# Patient Record
Sex: Female | Born: 1935 | Race: White | Hispanic: No | Smoking: Never smoker
Health system: Southern US, Community
[De-identification: ages and names within clinical notes are randomized; demographics above are authoritative.]

## PROBLEM LIST (undated history)

## (undated) DIAGNOSIS — IMO0001 Reserved for inherently not codable concepts without codable children: Secondary | ICD-10-CM

## (undated) DIAGNOSIS — E7800 Pure hypercholesterolemia, unspecified: Secondary | ICD-10-CM

## (undated) DIAGNOSIS — I10 Essential (primary) hypertension: Secondary | ICD-10-CM

## (undated) DIAGNOSIS — F419 Anxiety disorder, unspecified: Secondary | ICD-10-CM

## (undated) DIAGNOSIS — E785 Hyperlipidemia, unspecified: Secondary | ICD-10-CM

## (undated) DIAGNOSIS — I519 Heart disease, unspecified: Secondary | ICD-10-CM

## (undated) DIAGNOSIS — I480 Paroxysmal atrial fibrillation: Secondary | ICD-10-CM

## (undated) DIAGNOSIS — R011 Cardiac murmur, unspecified: Secondary | ICD-10-CM

## (undated) DIAGNOSIS — K859 Acute pancreatitis without necrosis or infection, unspecified: Secondary | ICD-10-CM

## (undated) DIAGNOSIS — K219 Gastro-esophageal reflux disease without esophagitis: Secondary | ICD-10-CM

## (undated) DIAGNOSIS — F32A Depression, unspecified: Secondary | ICD-10-CM

## (undated) DIAGNOSIS — I495 Sick sinus syndrome: Secondary | ICD-10-CM

## (undated) DIAGNOSIS — G4730 Sleep apnea, unspecified: Secondary | ICD-10-CM

## (undated) DIAGNOSIS — M1990 Unspecified osteoarthritis, unspecified site: Secondary | ICD-10-CM

## (undated) DIAGNOSIS — T7840XA Allergy, unspecified, initial encounter: Secondary | ICD-10-CM

## (undated) DIAGNOSIS — B029 Zoster without complications: Secondary | ICD-10-CM

## (undated) DIAGNOSIS — J449 Chronic obstructive pulmonary disease, unspecified: Secondary | ICD-10-CM

## (undated) DIAGNOSIS — D699 Hemorrhagic condition, unspecified: Secondary | ICD-10-CM

## (undated) DIAGNOSIS — E039 Hypothyroidism, unspecified: Secondary | ICD-10-CM

## (undated) DIAGNOSIS — J45909 Unspecified asthma, uncomplicated: Secondary | ICD-10-CM

## (undated) DIAGNOSIS — R7302 Impaired glucose tolerance (oral): Secondary | ICD-10-CM

## (undated) DIAGNOSIS — I4891 Unspecified atrial fibrillation: Secondary | ICD-10-CM

## (undated) DIAGNOSIS — H3530 Unspecified macular degeneration: Secondary | ICD-10-CM

## (undated) DIAGNOSIS — I509 Heart failure, unspecified: Secondary | ICD-10-CM

## (undated) DIAGNOSIS — H9190 Unspecified hearing loss, unspecified ear: Secondary | ICD-10-CM

## (undated) DIAGNOSIS — R0902 Hypoxemia: Secondary | ICD-10-CM

## (undated) HISTORY — DX: Acute pancreatitis without necrosis or infection, unspecified: K85.90

## (undated) HISTORY — DX: Unspecified macular degeneration: H35.30

## (undated) HISTORY — DX: Paroxysmal atrial fibrillation: I48.0

## (undated) HISTORY — DX: Heart failure, unspecified: I50.9

## (undated) HISTORY — DX: Essential (primary) hypertension: I10

## (undated) HISTORY — DX: Impaired glucose tolerance (oral): R73.02

## (undated) HISTORY — DX: Sick sinus syndrome: I49.5

## (undated) HISTORY — DX: Allergy, unspecified, initial encounter: T78.40XA

## (undated) HISTORY — DX: Anxiety disorder, unspecified: F41.9

## (undated) HISTORY — DX: Unspecified osteoarthritis, unspecified site: M19.90

## (undated) HISTORY — DX: Hyperlipidemia, unspecified: E78.5

## (undated) HISTORY — DX: Hemorrhagic condition, unspecified: D69.9

## (undated) HISTORY — DX: Unspecified atrial fibrillation: I48.91

## (undated) HISTORY — DX: Pure hypercholesterolemia, unspecified: E78.00

## (undated) HISTORY — DX: Unspecified asthma, uncomplicated: J45.909

## (undated) HISTORY — DX: Chronic obstructive pulmonary disease, unspecified: J44.9

## (undated) HISTORY — DX: Heart disease, unspecified: I51.9

## (undated) HISTORY — DX: Hypoxemia: R09.02

## (undated) HISTORY — DX: Depression, unspecified: F32.A

## (undated) HISTORY — DX: Gastro-esophageal reflux disease without esophagitis: K21.9

## (undated) HISTORY — DX: Zoster without complications: B02.9

## (undated) HISTORY — PX: EYE SURGERY: SHX253

## (undated) HISTORY — DX: Cardiac murmur, unspecified: R01.1

## (undated) HISTORY — DX: Hypothyroidism, unspecified: E03.9

## (undated) HISTORY — PX: CATARACT EXTRACTION: SUR2

## (undated) HISTORY — DX: Unspecified hearing loss, unspecified ear: H91.90

## (undated) HISTORY — DX: Reserved for inherently not codable concepts without codable children: IMO0001

## (undated) HISTORY — DX: Sleep apnea, unspecified: G47.30

## (undated) HISTORY — PX: ABDOMINAL HYSTERECTOMY: SHX81

---

## 1968-08-28 HISTORY — PX: SALPINGOOPHORECTOMY: SHX82

## 1998-01-20 ENCOUNTER — Other Ambulatory Visit: Admission: RE | Admit: 1998-01-20 | Discharge: 1998-01-20 | Payer: Self-pay | Admitting: Family Medicine

## 2003-08-21 ENCOUNTER — Inpatient Hospital Stay (HOSPITAL_COMMUNITY): Admission: EM | Admit: 2003-08-21 | Discharge: 2003-08-24 | Payer: Self-pay | Admitting: Emergency Medicine

## 2003-09-08 ENCOUNTER — Emergency Department (HOSPITAL_COMMUNITY): Admission: EM | Admit: 2003-09-08 | Discharge: 2003-09-09 | Payer: Self-pay | Admitting: *Deleted

## 2003-09-22 ENCOUNTER — Ambulatory Visit (HOSPITAL_COMMUNITY): Admission: RE | Admit: 2003-09-22 | Discharge: 2003-09-22 | Payer: Self-pay | Admitting: Family Medicine

## 2004-05-12 ENCOUNTER — Ambulatory Visit (HOSPITAL_COMMUNITY): Admission: RE | Admit: 2004-05-12 | Discharge: 2004-05-12 | Payer: Self-pay | Admitting: Family Medicine

## 2004-08-04 ENCOUNTER — Ambulatory Visit: Payer: Self-pay | Admitting: Family Medicine

## 2004-09-23 ENCOUNTER — Ambulatory Visit: Payer: Self-pay | Admitting: Family Medicine

## 2004-11-01 ENCOUNTER — Ambulatory Visit: Payer: Self-pay | Admitting: Family Medicine

## 2005-01-05 ENCOUNTER — Ambulatory Visit: Payer: Self-pay | Admitting: Family Medicine

## 2005-03-03 ENCOUNTER — Ambulatory Visit: Payer: Self-pay | Admitting: Family Medicine

## 2005-04-13 ENCOUNTER — Ambulatory Visit: Payer: Self-pay | Admitting: Family Medicine

## 2005-06-13 ENCOUNTER — Ambulatory Visit: Payer: Self-pay | Admitting: Family Medicine

## 2005-07-10 ENCOUNTER — Ambulatory Visit (HOSPITAL_COMMUNITY): Admission: RE | Admit: 2005-07-10 | Discharge: 2005-07-10 | Payer: Self-pay | Admitting: Family Medicine

## 2005-10-05 ENCOUNTER — Ambulatory Visit: Payer: Self-pay | Admitting: Family Medicine

## 2006-02-02 ENCOUNTER — Ambulatory Visit: Payer: Self-pay | Admitting: Family Medicine

## 2006-06-22 ENCOUNTER — Ambulatory Visit: Payer: Self-pay | Admitting: Family Medicine

## 2006-07-13 ENCOUNTER — Ambulatory Visit (HOSPITAL_COMMUNITY): Admission: RE | Admit: 2006-07-13 | Discharge: 2006-07-13 | Payer: Self-pay | Admitting: Family Medicine

## 2006-11-21 ENCOUNTER — Encounter: Payer: Self-pay | Admitting: Family Medicine

## 2006-11-21 LAB — CONVERTED CEMR LAB
Indirect Bilirubin: 0.3 mg/dL (ref 0.0–0.9)
LDL Cholesterol: 63 mg/dL (ref 0–99)
Total Protein: 7.1 g/dL (ref 6.0–8.3)
Triglycerides: 165 mg/dL — ABNORMAL HIGH (ref <150)
VLDL: 33 mg/dL (ref 0–40)

## 2006-11-30 ENCOUNTER — Ambulatory Visit: Payer: Self-pay | Admitting: Family Medicine

## 2006-12-07 ENCOUNTER — Ambulatory Visit (HOSPITAL_COMMUNITY): Admission: RE | Admit: 2006-12-07 | Discharge: 2006-12-07 | Payer: Self-pay | Admitting: Family Medicine

## 2007-02-21 ENCOUNTER — Encounter: Payer: Self-pay | Admitting: Family Medicine

## 2007-02-21 LAB — CONVERTED CEMR LAB
ALT: 10 units/L (ref 0–35)
Albumin: 4.3 g/dL (ref 3.5–5.2)
Cholesterol: 159 mg/dL (ref 0–200)
Glucose, Bld: 98 mg/dL (ref 70–99)
Potassium: 4.1 meq/L (ref 3.5–5.3)
Sodium: 138 meq/L (ref 135–145)
TSH: 0.334 microintl units/mL — ABNORMAL LOW (ref 0.350–5.50)
Total CHOL/HDL Ratio: 3.3
Total Protein: 7.5 g/dL (ref 6.0–8.3)
Triglycerides: 189 mg/dL — ABNORMAL HIGH (ref <150)
VLDL: 38 mg/dL (ref 0–40)

## 2007-04-01 ENCOUNTER — Ambulatory Visit: Payer: Self-pay | Admitting: Family Medicine

## 2007-04-15 ENCOUNTER — Encounter: Payer: Self-pay | Admitting: Family Medicine

## 2007-04-15 LAB — CONVERTED CEMR LAB: TSH: 0.854 microintl units/mL (ref 0.350–5.50)

## 2007-05-07 ENCOUNTER — Ambulatory Visit: Payer: Self-pay | Admitting: Family Medicine

## 2007-05-07 ENCOUNTER — Ambulatory Visit (HOSPITAL_COMMUNITY): Admission: RE | Admit: 2007-05-07 | Discharge: 2007-05-07 | Payer: Self-pay | Admitting: Family Medicine

## 2007-05-07 LAB — CONVERTED CEMR LAB
Amylase: 58 units/L (ref 0–105)
Basophils Relative: 0 % (ref 0–1)
Calcium: 11.4 mg/dL — ABNORMAL HIGH (ref 8.4–10.5)
Eosinophils Absolute: 0 10*3/uL (ref 0.0–0.7)
Glucose, Bld: 121 mg/dL — ABNORMAL HIGH (ref 70–99)
MCHC: 33 g/dL (ref 30.0–36.0)
MCV: 89.4 fL (ref 78.0–100.0)
Neutrophils Relative %: 85 % — ABNORMAL HIGH (ref 43–77)
Platelets: 297 10*3/uL (ref 150–400)
Sodium: 142 meq/L (ref 135–145)

## 2007-05-08 ENCOUNTER — Inpatient Hospital Stay (HOSPITAL_COMMUNITY): Admission: AD | Admit: 2007-05-08 | Discharge: 2007-05-10 | Payer: Self-pay

## 2007-05-08 ENCOUNTER — Ambulatory Visit: Payer: Self-pay | Admitting: Gastroenterology

## 2007-05-08 ENCOUNTER — Encounter: Payer: Self-pay | Admitting: Family Medicine

## 2007-05-08 LAB — CONVERTED CEMR LAB
ALT: 16 units/L (ref 0–35)
Albumin: 4.3 g/dL (ref 3.5–5.2)
Bilirubin, Direct: 0.1 mg/dL (ref 0.0–0.3)
Total Bilirubin: 0.7 mg/dL (ref 0.3–1.2)

## 2007-05-23 ENCOUNTER — Ambulatory Visit: Payer: Self-pay | Admitting: Family Medicine

## 2007-05-23 LAB — CONVERTED CEMR LAB
Amylase: 77 units/L (ref 27–131)
BUN: 14 mg/dL (ref 6–23)
CO2: 32 meq/L (ref 19–32)
Chloride: 99 meq/L (ref 96–112)
Eosinophils Absolute: 0.3 10*3/uL (ref 0.0–0.7)
Glucose, Bld: 98 mg/dL (ref 70–99)
Lymphocytes Relative: 24 % (ref 12–46)
Lymphs Abs: 2.2 10*3/uL (ref 0.7–3.3)
MCV: 86.6 fL (ref 78.0–100.0)
Monocytes Relative: 7 % (ref 3–11)
Neutrophils Relative %: 65 % (ref 43–77)
Potassium: 4.2 meq/L (ref 3.5–5.3)
RBC: 3.96 M/uL (ref 3.87–5.11)
WBC: 9.2 10*3/uL (ref 4.0–10.5)

## 2007-06-24 ENCOUNTER — Encounter: Payer: Self-pay | Admitting: Gastroenterology

## 2007-06-24 ENCOUNTER — Ambulatory Visit (HOSPITAL_COMMUNITY): Admission: RE | Admit: 2007-06-24 | Discharge: 2007-06-24 | Payer: Self-pay | Admitting: Gastroenterology

## 2007-06-24 ENCOUNTER — Ambulatory Visit: Payer: Self-pay | Admitting: Gastroenterology

## 2007-06-24 LAB — HM COLONOSCOPY: HM Colonoscopy: ABNORMAL

## 2007-07-04 ENCOUNTER — Ambulatory Visit: Payer: Self-pay | Admitting: Family Medicine

## 2007-08-16 ENCOUNTER — Ambulatory Visit (HOSPITAL_COMMUNITY): Admission: RE | Admit: 2007-08-16 | Discharge: 2007-08-16 | Payer: Self-pay | Admitting: Family Medicine

## 2007-08-19 ENCOUNTER — Encounter: Payer: Self-pay | Admitting: Family Medicine

## 2007-08-19 LAB — CONVERTED CEMR LAB
Alkaline Phosphatase: 107 units/L (ref 39–117)
BUN: 17 mg/dL (ref 6–23)
CO2: 21 meq/L (ref 19–32)
Chloride: 103 meq/L (ref 96–112)
Creatinine, Ser: 1.07 mg/dL (ref 0.40–1.20)
Glucose, Bld: 101 mg/dL — ABNORMAL HIGH (ref 70–99)
Indirect Bilirubin: 0.3 mg/dL (ref 0.0–0.9)
LDL Cholesterol: 60 mg/dL (ref 0–99)
Potassium: 4.3 meq/L (ref 3.5–5.3)
Total Bilirubin: 0.4 mg/dL (ref 0.3–1.2)
VLDL: 34 mg/dL (ref 0–40)

## 2007-08-29 HISTORY — PX: COLONOSCOPY: SHX174

## 2007-10-23 ENCOUNTER — Ambulatory Visit: Payer: Self-pay | Admitting: Family Medicine

## 2007-10-23 LAB — CONVERTED CEMR LAB: TSH: 2.468 microintl units/mL (ref 0.350–5.50)

## 2008-02-17 ENCOUNTER — Encounter: Payer: Self-pay | Admitting: Family Medicine

## 2008-02-17 LAB — CONVERTED CEMR LAB
ALT: 10 units/L (ref 0–35)
AST: 14 units/L (ref 0–37)
Albumin: 4.4 g/dL (ref 3.5–5.2)
Bilirubin, Direct: 0.1 mg/dL (ref 0.0–0.3)
CO2: 25 meq/L (ref 19–32)
Calcium: 9.1 mg/dL (ref 8.4–10.5)
Cholesterol: 165 mg/dL (ref 0–200)
Glucose, Bld: 113 mg/dL — ABNORMAL HIGH (ref 70–99)
HDL: 49 mg/dL (ref >39)
Potassium: 3.6 meq/L (ref 3.5–5.3)
Sodium: 139 meq/L (ref 135–145)
TSH: 6.231 microintl units/mL — ABNORMAL HIGH (ref 0.350–5.50)
Total Bilirubin: 0.5 mg/dL (ref 0.3–1.2)
Total CHOL/HDL Ratio: 3.4
VLDL: 37 mg/dL (ref 0–40)

## 2008-02-18 ENCOUNTER — Ambulatory Visit: Payer: Self-pay | Admitting: Family Medicine

## 2008-02-20 ENCOUNTER — Encounter: Payer: Self-pay | Admitting: Family Medicine

## 2008-02-20 LAB — CONVERTED CEMR LAB: Glucose, Fasting: 94 mg/dL (ref 70–99)

## 2008-02-25 ENCOUNTER — Encounter: Payer: Self-pay | Admitting: Family Medicine

## 2008-02-25 DIAGNOSIS — E785 Hyperlipidemia, unspecified: Secondary | ICD-10-CM | POA: Insufficient documentation

## 2008-02-25 DIAGNOSIS — E782 Mixed hyperlipidemia: Secondary | ICD-10-CM | POA: Insufficient documentation

## 2008-02-25 DIAGNOSIS — E739 Lactose intolerance, unspecified: Secondary | ICD-10-CM | POA: Insufficient documentation

## 2008-02-25 DIAGNOSIS — Z8719 Personal history of other diseases of the digestive system: Secondary | ICD-10-CM | POA: Insufficient documentation

## 2008-02-25 DIAGNOSIS — E039 Hypothyroidism, unspecified: Secondary | ICD-10-CM | POA: Insufficient documentation

## 2008-02-25 DIAGNOSIS — I10 Essential (primary) hypertension: Secondary | ICD-10-CM | POA: Insufficient documentation

## 2008-02-25 DIAGNOSIS — H9190 Unspecified hearing loss, unspecified ear: Secondary | ICD-10-CM | POA: Insufficient documentation

## 2008-03-05 ENCOUNTER — Ambulatory Visit: Payer: Self-pay | Admitting: Family Medicine

## 2008-03-07 ENCOUNTER — Encounter: Payer: Self-pay | Admitting: Family Medicine

## 2008-04-01 ENCOUNTER — Telehealth: Payer: Self-pay | Admitting: Family Medicine

## 2008-04-02 ENCOUNTER — Telehealth: Payer: Self-pay | Admitting: Family Medicine

## 2008-04-21 ENCOUNTER — Telehealth: Payer: Self-pay | Admitting: Family Medicine

## 2008-05-20 ENCOUNTER — Encounter: Payer: Self-pay | Admitting: Family Medicine

## 2008-05-20 LAB — CONVERTED CEMR LAB
Basophils Absolute: 0.1 10*3/uL (ref 0.0–0.1)
Basophils Relative: 1 % (ref 0–1)
Calcium: 10.3 mg/dL (ref 8.4–10.5)
Eosinophils Absolute: 0.7 10*3/uL (ref 0.0–0.7)
Eosinophils Relative: 10 % — ABNORMAL HIGH (ref 0–5)
HCT: 39.7 % (ref 36.0–46.0)
MCV: 93.4 fL (ref 78.0–100.0)
Neutrophils Relative %: 41 % — ABNORMAL LOW (ref 43–77)
Platelets: 335 10*3/uL (ref 150–400)
Potassium: 4.7 meq/L (ref 3.5–5.3)
RDW: 13.6 % (ref 11.5–15.5)
Sodium: 141 meq/L (ref 135–145)
TSH: 0.366 microintl units/mL (ref 0.350–4.50)
WBC: 7 10*3/uL (ref 4.0–10.5)

## 2008-05-27 ENCOUNTER — Ambulatory Visit: Payer: Self-pay | Admitting: Family Medicine

## 2008-06-09 ENCOUNTER — Ambulatory Visit: Payer: Self-pay | Admitting: Family Medicine

## 2008-07-01 ENCOUNTER — Ambulatory Visit: Payer: Self-pay | Admitting: Family Medicine

## 2008-09-07 ENCOUNTER — Encounter: Payer: Self-pay | Admitting: Family Medicine

## 2008-09-14 ENCOUNTER — Encounter: Payer: Self-pay | Admitting: Family Medicine

## 2008-09-14 LAB — CONVERTED CEMR LAB
ALT: 9 units/L (ref 0–35)
AST: 14 units/L (ref 0–37)
Albumin: 4.4 g/dL (ref 3.5–5.2)
Alkaline Phosphatase: 86 units/L (ref 39–117)
Cholesterol: 158 mg/dL (ref 0–200)
HDL: 46 mg/dL (ref >39)
Total Bilirubin: 0.5 mg/dL (ref 0.3–1.2)
Total CHOL/HDL Ratio: 3.4
Total Protein: 7.3 g/dL (ref 6.0–8.3)
Triglycerides: 230 mg/dL — ABNORMAL HIGH (ref <150)

## 2008-10-07 ENCOUNTER — Ambulatory Visit: Payer: Self-pay | Admitting: Family Medicine

## 2008-10-10 DIAGNOSIS — E663 Overweight: Secondary | ICD-10-CM | POA: Insufficient documentation

## 2008-10-23 IMAGING — CT CT ABDOMEN W/ CM
1 of 3 series · 13 of 32 positions shown, 18 images · IV contrast (Omnipaque 300)
Comparison: Chest film earlier today

ABDOMEN CT WITH CONTRAST

CLINICAL DATA: Right lower quadrant pain. Fever. Prior hysterectomy and left
oophorectomy.
TECHNIQUE: Multidetector CT imaging of the abdomen and pelvis was performed
following the standard protocol during bolus administration of intravenous
contrast.

Contrast:  100 cc Omnipaque 300

[Series 2: abd_pel 5.0 b40f · axial · 0.72mm/px · z∈[-474,-34]mm · 13 of 100 slices shown, 18 images]
[im 6/100  soft-tissue]
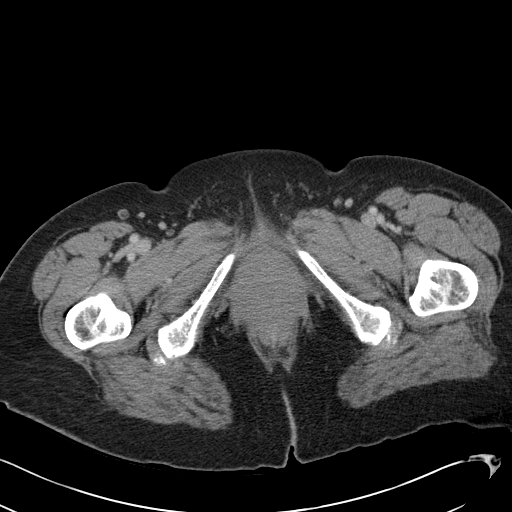
[im 6/100  bone]
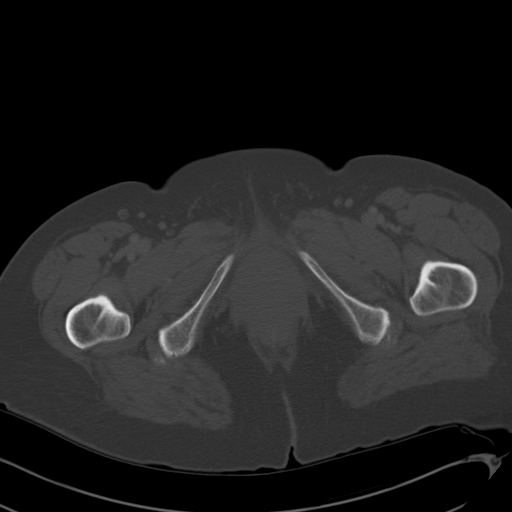
[im 17/100  soft-tissue]
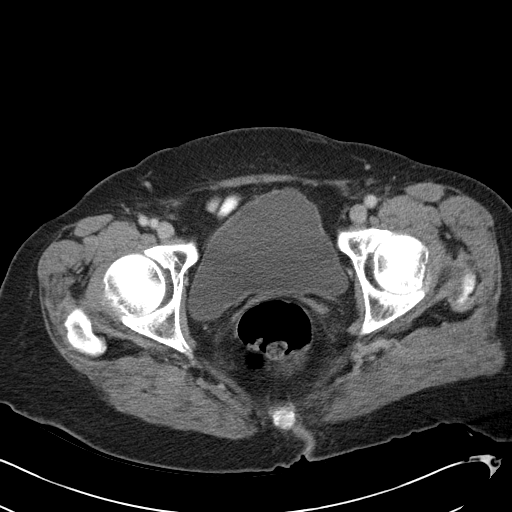
[im 23/100  soft-tissue]
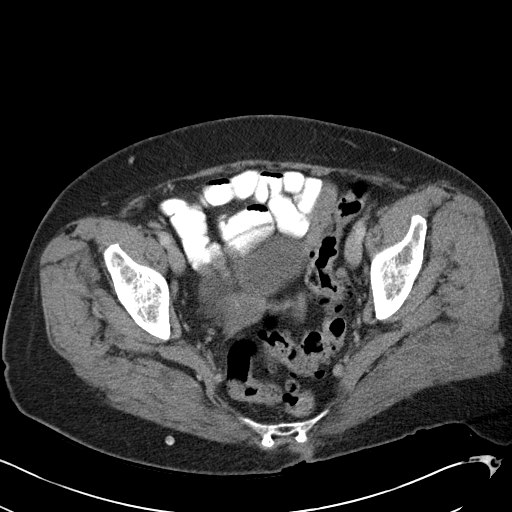
[im 28/100  soft-tissue]
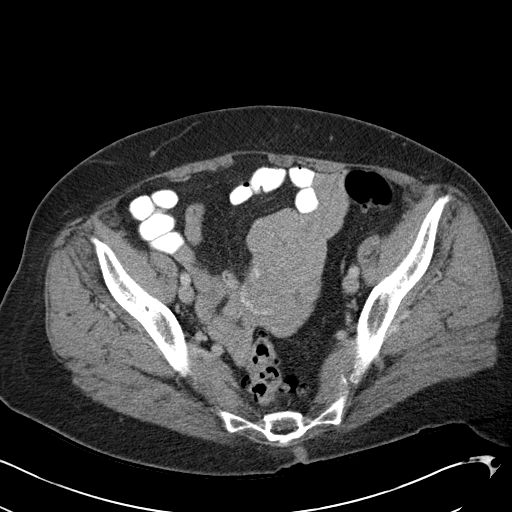
[im 39/100  soft-tissue]
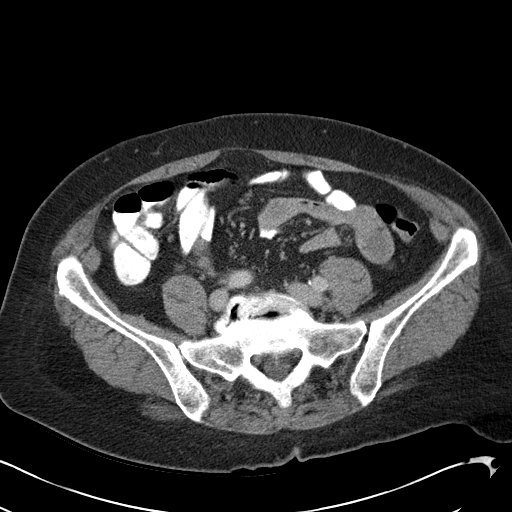
[im 45/100  soft-tissue]
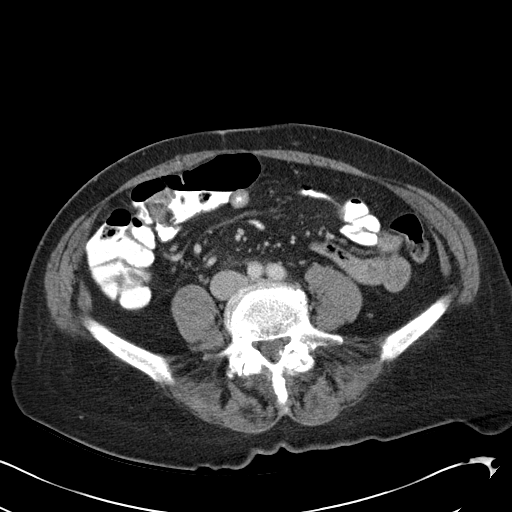
[im 56/100  soft-tissue]
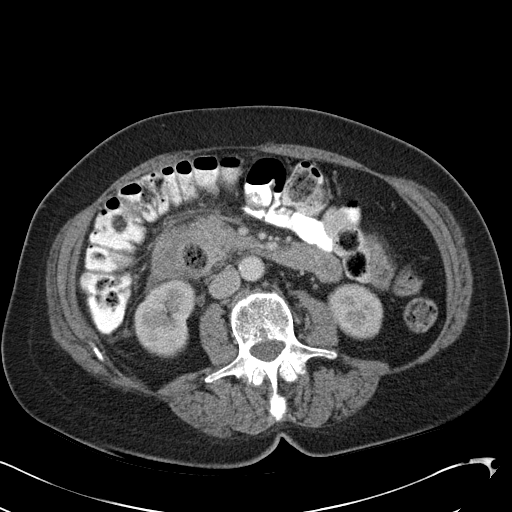
[im 61/100  soft-tissue]
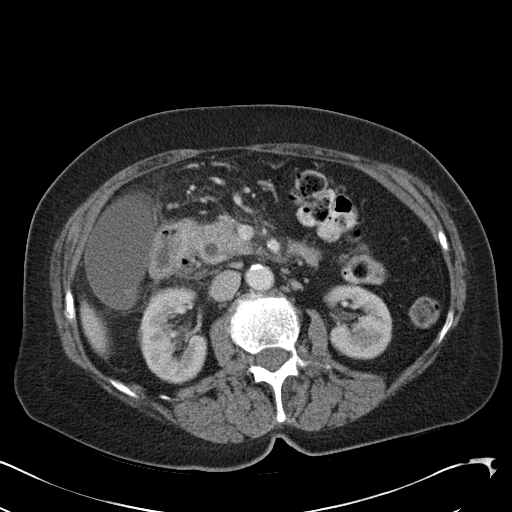
[im 72/100  soft-tissue]
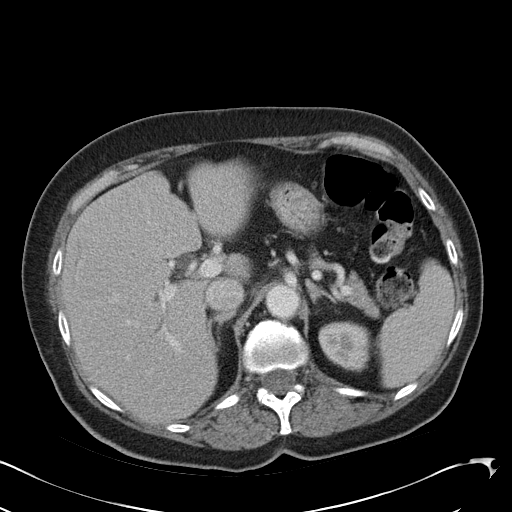
[im 72/100  bone]
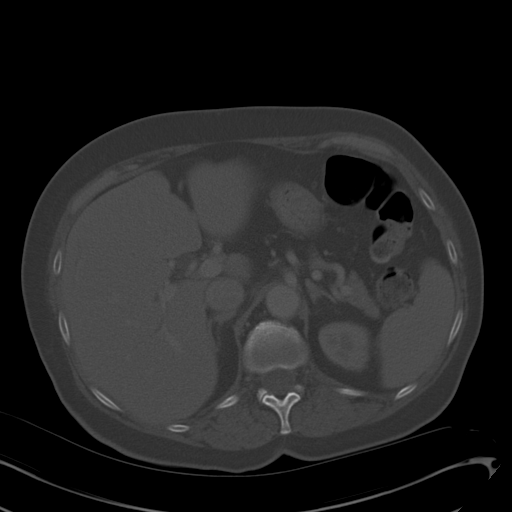
[im 78/100  soft-tissue]
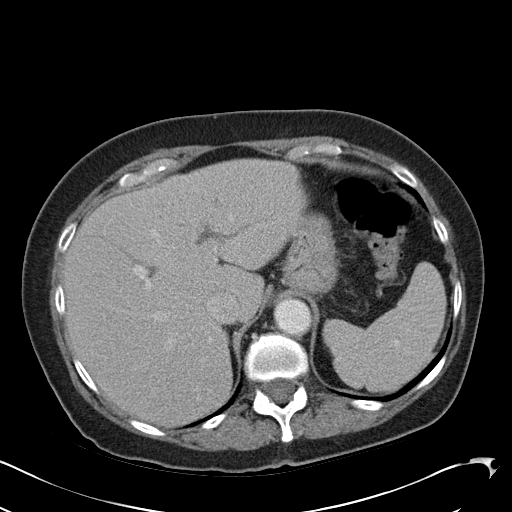
[im 78/100  lung]
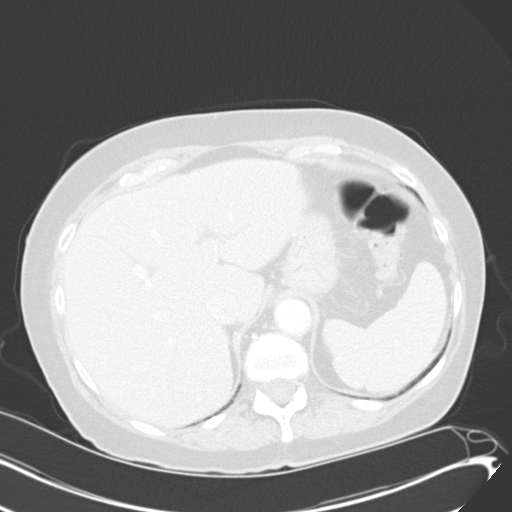
[im 83/100  soft-tissue]
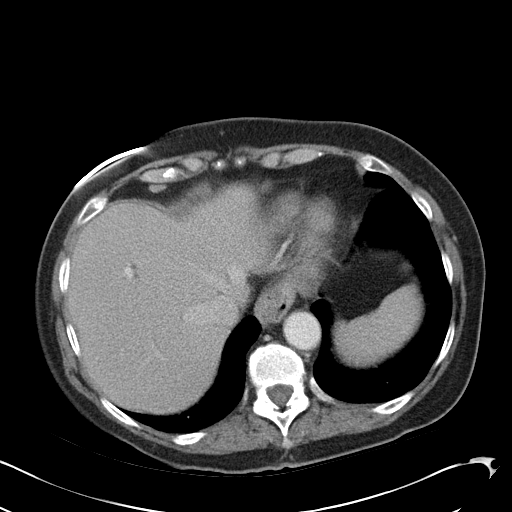
[im 83/100  lung]
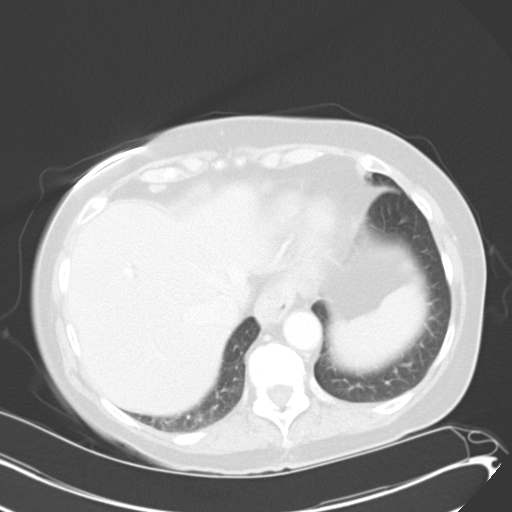
[im 89/100  lung]
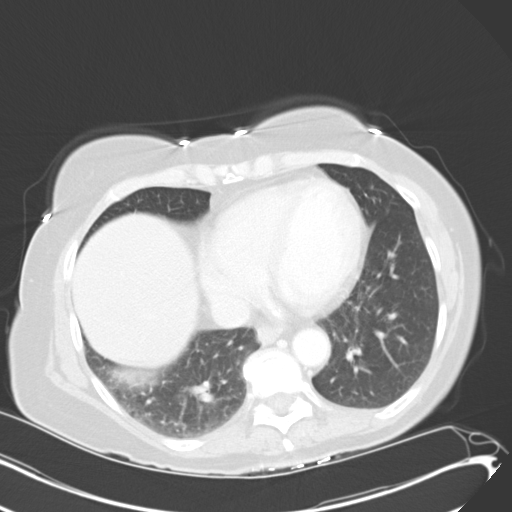
[im 94/100  soft-tissue]
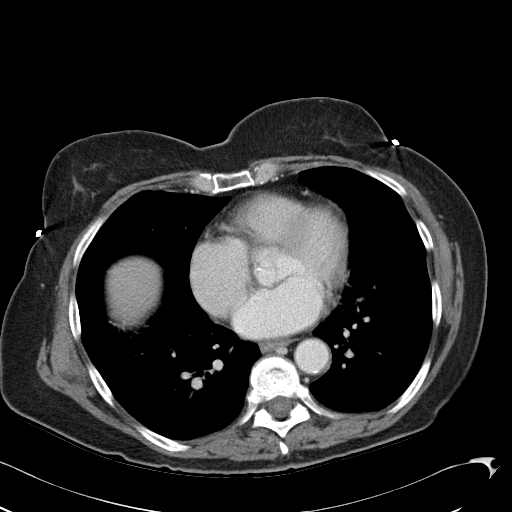
[im 94/100  lung]
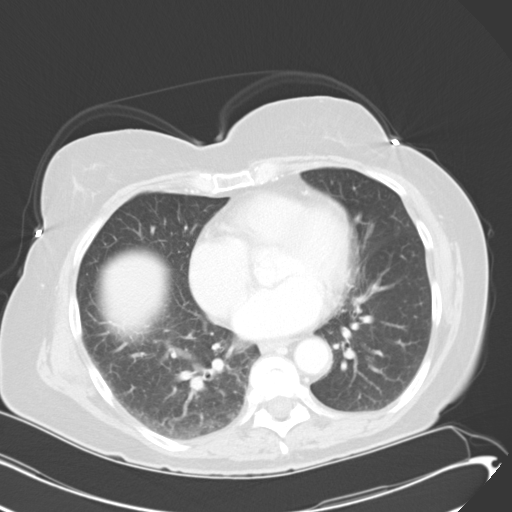

[13 of 32 positions shown; findings below may reference images not displayed]

FINDINGS: Clear lung bases. Normal heart size without pericardial or pleural
effusion. Small hiatal hernia.

No focal liver lesions.

Normal spleen and stomach. Pancreatic tail and body within normal limits. No
pancreatic ductal dilatation.

Gallbladder distention. No pericholecystic edema. Mild intrahepatic biliary
ductal dilatation common duct measuring approximately 11 mm. The common duct
extends anterior to a gas and soft tissue filled structure felt to represent a
duodenal diverticulum. There is moderate edema center at the duodenal
diverticulum as well as within the pancreatic head and duodenum. Fluid in the
anterior pararenal space. No free intraperitoneal air.

Normal adrenal glands. Too small characterize bilateral renal lesions likely
cysts. Small retroperitoneal lymph nodes.

Normal colon. Appendix may be seen on image 63. No right lower quadrant
inflammation. Portions of small bowel unopacified. No ascites.

IMPRESSION

1. Inflammatory process centered at the descending duodenal diverticulum.
Favored etiology diverticulitis. Duodenitis or pancreatitis could look similar.
Consider correlation with pancreatic enzyme levels.
2. Mild biliary ductal dilatation and gallbladder distention. Findings likely
relate to a component of biliary obstruction at the ampulla secondary to
edematous process. Consider correlation with bilirubin levels and or right upper
quadrant ultrasound.
3. No evidence of appendicitis.
4. Small hiatal hernia.

PELVIS CT WITH CONTRAST
FINDINGS: colon diverticulosis. Normal pelvic small bowel. No pelvic
adenopathy. Normal urinary bladder. Hysterectomy. Small calcific foci in right
pelvis on images 76-77 could be related to the right ovary.

Normal bones.

IMPRESSION

1. No acute pelvic process.
2. Hysterectomy.
3. Calcific foci may be associated with the right ovary. These could represent
an underlying tiny teratoma or be dystrophic in nature. Pelvic ultrasound may
help delineate.

## 2008-11-05 ENCOUNTER — Ambulatory Visit: Payer: Self-pay | Admitting: Family Medicine

## 2008-11-05 LAB — CONVERTED CEMR LAB
Basophils Absolute: 0.1 10*3/uL (ref 0.0–0.1)
Basophils Relative: 1 % (ref 0–1)
Eosinophils Absolute: 0.7 10*3/uL (ref 0.0–0.7)
Hemoglobin: 12.7 g/dL (ref 12.0–15.0)
MCHC: 32.3 g/dL (ref 30.0–36.0)
Monocytes Absolute: 0.9 10*3/uL (ref 0.1–1.0)
Monocytes Relative: 10 % (ref 3–12)
Neutrophils Relative %: 39 % — ABNORMAL LOW (ref 43–77)
Platelets: 314 10*3/uL (ref 150–400)
RDW: 13 % (ref 11.5–15.5)

## 2008-11-17 ENCOUNTER — Ambulatory Visit (HOSPITAL_COMMUNITY): Admission: RE | Admit: 2008-11-17 | Discharge: 2008-11-17 | Payer: Self-pay | Admitting: Family Medicine

## 2008-12-24 ENCOUNTER — Ambulatory Visit: Payer: Self-pay | Admitting: Family Medicine

## 2009-01-20 ENCOUNTER — Encounter: Payer: Self-pay | Admitting: Family Medicine

## 2009-02-26 ENCOUNTER — Encounter: Payer: Self-pay | Admitting: Family Medicine

## 2009-02-26 LAB — CONVERTED CEMR LAB
AST: 17 units/L (ref 0–37)
Alkaline Phosphatase: 75 units/L (ref 39–117)
Bilirubin, Direct: 0.1 mg/dL (ref 0.0–0.3)
CO2: 25 meq/L (ref 19–32)
Calcium: 9.5 mg/dL (ref 8.4–10.5)
Cholesterol: 178 mg/dL (ref 0–200)
Creatinine, Ser: 0.97 mg/dL (ref 0.40–1.20)
Glucose, Bld: 109 mg/dL — ABNORMAL HIGH (ref 70–99)
Total Bilirubin: 0.4 mg/dL (ref 0.3–1.2)

## 2009-03-03 ENCOUNTER — Ambulatory Visit: Payer: Self-pay | Admitting: Family Medicine

## 2009-03-03 DIAGNOSIS — R413 Other amnesia: Secondary | ICD-10-CM | POA: Insufficient documentation

## 2009-03-05 ENCOUNTER — Telehealth: Payer: Self-pay | Admitting: Family Medicine

## 2009-03-10 ENCOUNTER — Ambulatory Visit (HOSPITAL_COMMUNITY): Admission: RE | Admit: 2009-03-10 | Discharge: 2009-03-10 | Payer: Self-pay | Admitting: Family Medicine

## 2009-03-11 ENCOUNTER — Telehealth: Payer: Self-pay | Admitting: Family Medicine

## 2009-03-19 ENCOUNTER — Encounter: Payer: Self-pay | Admitting: Family Medicine

## 2009-04-13 ENCOUNTER — Telehealth: Payer: Self-pay | Admitting: Family Medicine

## 2009-04-19 ENCOUNTER — Ambulatory Visit (HOSPITAL_COMMUNITY): Payer: Self-pay | Admitting: Psychology

## 2009-04-22 ENCOUNTER — Encounter: Payer: Self-pay | Admitting: Family Medicine

## 2009-05-05 ENCOUNTER — Ambulatory Visit: Payer: Self-pay | Admitting: Family Medicine

## 2009-06-03 LAB — CONVERTED CEMR LAB
Albumin: 4.4 g/dL (ref 3.5–5.2)
Alkaline Phosphatase: 93 units/L (ref 39–117)
BUN: 25 mg/dL — ABNORMAL HIGH (ref 6–23)
Bilirubin, Direct: 0.1 mg/dL (ref 0.0–0.3)
CO2: 24 meq/L (ref 19–32)
Chloride: 102 meq/L (ref 96–112)
Creatinine, Ser: 1.12 mg/dL (ref 0.40–1.20)
Glucose, Bld: 109 mg/dL — ABNORMAL HIGH (ref 70–99)
Indirect Bilirubin: 0.5 mg/dL (ref 0.0–0.9)
LDL Cholesterol: 80 mg/dL (ref 0–99)
Total Bilirubin: 0.6 mg/dL (ref 0.3–1.2)

## 2009-06-10 ENCOUNTER — Ambulatory Visit: Payer: Self-pay | Admitting: Family Medicine

## 2009-06-24 ENCOUNTER — Encounter: Payer: Self-pay | Admitting: Family Medicine

## 2009-07-12 ENCOUNTER — Telehealth: Payer: Self-pay | Admitting: Family Medicine

## 2009-07-28 ENCOUNTER — Encounter: Payer: Self-pay | Admitting: Family Medicine

## 2009-08-26 ENCOUNTER — Encounter: Payer: Self-pay | Admitting: Family Medicine

## 2009-08-28 DIAGNOSIS — I480 Paroxysmal atrial fibrillation: Secondary | ICD-10-CM

## 2009-08-28 DIAGNOSIS — I495 Sick sinus syndrome: Secondary | ICD-10-CM

## 2009-08-28 HISTORY — PX: PACEMAKER INSERTION: SHX728

## 2009-08-28 HISTORY — DX: Paroxysmal atrial fibrillation: I48.0

## 2009-08-28 HISTORY — DX: Sick sinus syndrome: I49.5

## 2009-09-27 ENCOUNTER — Encounter: Payer: Self-pay | Admitting: Family Medicine

## 2009-11-11 ENCOUNTER — Encounter (INDEPENDENT_AMBULATORY_CARE_PROVIDER_SITE_OTHER): Payer: Self-pay | Admitting: *Deleted

## 2009-11-11 LAB — CONVERTED CEMR LAB
Albumin: 4.3 g/dL
Alkaline Phosphatase: 84 units/L
BUN: 20 mg/dL
CO2: 25 meq/L
Calcium: 9.3 mg/dL
Chloride: 103 meq/L
Glucose, Bld: 99 mg/dL
Potassium: 4 meq/L
Sodium: 140 meq/L
Total Protein: 7 g/dL

## 2009-11-12 LAB — CONVERTED CEMR LAB
ALT: 14 units/L (ref 0–35)
Bilirubin, Direct: 0.1 mg/dL (ref 0.0–0.3)
CO2: 25 meq/L (ref 19–32)
Cholesterol: 122 mg/dL (ref 0–200)
Glucose, Bld: 99 mg/dL (ref 70–99)
Potassium: 4 meq/L (ref 3.5–5.3)
Sodium: 140 meq/L (ref 135–145)
TSH: 0.037 microintl units/mL — ABNORMAL LOW (ref 0.350–4.500)
Total CHOL/HDL Ratio: 2.9
Total Protein: 7 g/dL (ref 6.0–8.3)
VLDL: 38 mg/dL (ref 0–40)

## 2009-11-18 ENCOUNTER — Ambulatory Visit: Payer: Self-pay | Admitting: Family Medicine

## 2009-11-26 ENCOUNTER — Encounter (INDEPENDENT_AMBULATORY_CARE_PROVIDER_SITE_OTHER): Payer: Self-pay | Admitting: Internal Medicine

## 2009-11-26 ENCOUNTER — Inpatient Hospital Stay (HOSPITAL_COMMUNITY): Admission: EM | Admit: 2009-11-26 | Discharge: 2009-12-01 | Payer: Self-pay | Admitting: Emergency Medicine

## 2009-11-26 ENCOUNTER — Ambulatory Visit: Payer: Self-pay | Admitting: Family Medicine

## 2009-11-26 ENCOUNTER — Ambulatory Visit: Payer: Self-pay | Admitting: Cardiology

## 2009-12-03 ENCOUNTER — Ambulatory Visit: Payer: Self-pay | Admitting: Family Medicine

## 2009-12-05 DIAGNOSIS — F411 Generalized anxiety disorder: Secondary | ICD-10-CM | POA: Insufficient documentation

## 2009-12-05 DIAGNOSIS — J309 Allergic rhinitis, unspecified: Secondary | ICD-10-CM | POA: Insufficient documentation

## 2009-12-08 ENCOUNTER — Ambulatory Visit: Payer: Self-pay | Admitting: Cardiology

## 2009-12-08 LAB — CONVERTED CEMR LAB: POC INR: 2.5

## 2009-12-09 ENCOUNTER — Encounter: Payer: Self-pay | Admitting: Family Medicine

## 2009-12-13 ENCOUNTER — Ambulatory Visit: Payer: Self-pay | Admitting: Cardiovascular Disease

## 2009-12-15 ENCOUNTER — Ambulatory Visit: Payer: Self-pay | Admitting: Family Medicine

## 2009-12-15 ENCOUNTER — Encounter: Payer: Self-pay | Admitting: Family Medicine

## 2009-12-15 ENCOUNTER — Encounter: Payer: Self-pay | Admitting: Emergency Medicine

## 2009-12-15 DIAGNOSIS — R609 Edema, unspecified: Secondary | ICD-10-CM | POA: Insufficient documentation

## 2009-12-16 ENCOUNTER — Telehealth: Payer: Self-pay | Admitting: Family Medicine

## 2009-12-16 ENCOUNTER — Ambulatory Visit: Payer: Self-pay | Admitting: Cardiology

## 2009-12-16 ENCOUNTER — Inpatient Hospital Stay (HOSPITAL_COMMUNITY): Admission: EM | Admit: 2009-12-16 | Discharge: 2009-12-18 | Payer: Self-pay | Admitting: Cardiology

## 2009-12-16 ENCOUNTER — Encounter: Payer: Self-pay | Admitting: Internal Medicine

## 2009-12-16 ENCOUNTER — Ambulatory Visit: Payer: Self-pay | Admitting: Internal Medicine

## 2009-12-18 ENCOUNTER — Encounter: Payer: Self-pay | Admitting: Family Medicine

## 2009-12-18 ENCOUNTER — Encounter: Payer: Self-pay | Admitting: Cardiology

## 2009-12-20 ENCOUNTER — Telehealth: Payer: Self-pay | Admitting: Family Medicine

## 2009-12-20 ENCOUNTER — Ambulatory Visit: Payer: Self-pay | Admitting: Cardiology

## 2009-12-22 ENCOUNTER — Telehealth: Payer: Self-pay | Admitting: Family Medicine

## 2009-12-23 ENCOUNTER — Encounter: Payer: Self-pay | Admitting: Internal Medicine

## 2009-12-27 ENCOUNTER — Ambulatory Visit: Payer: Self-pay | Admitting: Cardiology

## 2009-12-27 LAB — CONVERTED CEMR LAB: POC INR: 3.2

## 2009-12-28 ENCOUNTER — Encounter: Payer: Self-pay | Admitting: Family Medicine

## 2010-01-03 ENCOUNTER — Ambulatory Visit: Payer: Self-pay | Admitting: Cardiology

## 2010-01-03 ENCOUNTER — Telehealth: Payer: Self-pay | Admitting: Physician Assistant

## 2010-01-03 ENCOUNTER — Encounter: Payer: Self-pay | Admitting: Internal Medicine

## 2010-01-04 ENCOUNTER — Encounter: Payer: Self-pay | Admitting: Adult Health

## 2010-01-06 ENCOUNTER — Ambulatory Visit: Payer: Self-pay | Admitting: Cardiology

## 2010-01-06 LAB — CONVERTED CEMR LAB: POC INR: 2.6

## 2010-01-10 ENCOUNTER — Ambulatory Visit: Admission: RE | Admit: 2010-01-10 | Discharge: 2010-01-10 | Payer: Self-pay | Admitting: Pulmonary Disease

## 2010-01-11 ENCOUNTER — Ambulatory Visit: Payer: Self-pay | Admitting: Family Medicine

## 2010-01-11 DIAGNOSIS — N3001 Acute cystitis with hematuria: Secondary | ICD-10-CM | POA: Insufficient documentation

## 2010-01-11 DIAGNOSIS — N3000 Acute cystitis without hematuria: Secondary | ICD-10-CM

## 2010-01-11 LAB — CONVERTED CEMR LAB
Glucose, Urine, Semiquant: 100
Nitrite: POSITIVE
Specific Gravity, Urine: <1.005
WBC Urine, dipstick: NEGATIVE
pH: 5

## 2010-01-12 ENCOUNTER — Encounter: Payer: Self-pay | Admitting: Family Medicine

## 2010-01-13 ENCOUNTER — Ambulatory Visit: Payer: Self-pay | Admitting: Family Medicine

## 2010-01-13 ENCOUNTER — Encounter (INDEPENDENT_AMBULATORY_CARE_PROVIDER_SITE_OTHER): Payer: Self-pay | Admitting: *Deleted

## 2010-01-14 ENCOUNTER — Ambulatory Visit (HOSPITAL_COMMUNITY): Admission: RE | Admit: 2010-01-14 | Discharge: 2010-01-14 | Payer: Self-pay | Admitting: Pulmonary Disease

## 2010-01-17 ENCOUNTER — Ambulatory Visit: Payer: Self-pay | Admitting: Cardiology

## 2010-01-25 LAB — CONVERTED CEMR LAB
ALT: 17 units/L (ref 0–35)
AST: 21 units/L (ref 0–37)
Calcium: 9.8 mg/dL (ref 8.4–10.5)
Chloride: 103 meq/L (ref 96–112)
Creatinine, Ser: 0.88 mg/dL (ref 0.40–1.20)
Eosinophils Absolute: 0.1 10*3/uL (ref 0.0–0.7)
Lymphs Abs: 2.6 10*3/uL (ref 0.7–4.0)
MCV: 91.7 fL (ref 78.0–100.0)
Neutro Abs: 6.4 10*3/uL (ref 1.7–7.7)
Neutrophils Relative %: 64 % (ref 43–77)
Platelets: 280 10*3/uL (ref 150–400)
Sodium: 142 meq/L (ref 135–145)
Total Bilirubin: 0.5 mg/dL (ref 0.3–1.2)
Total CHOL/HDL Ratio: 4.2
VLDL: 62 mg/dL — ABNORMAL HIGH (ref 0–40)
WBC: 9.9 10*3/uL (ref 4.0–10.5)

## 2010-01-27 ENCOUNTER — Encounter (INDEPENDENT_AMBULATORY_CARE_PROVIDER_SITE_OTHER): Payer: Self-pay | Admitting: *Deleted

## 2010-02-02 ENCOUNTER — Ambulatory Visit: Payer: Self-pay | Admitting: Cardiology

## 2010-02-02 LAB — CONVERTED CEMR LAB: POC INR: 3.7

## 2010-02-15 ENCOUNTER — Ambulatory Visit: Admission: RE | Admit: 2010-02-15 | Discharge: 2010-02-15 | Payer: Self-pay | Admitting: Pulmonary Disease

## 2010-02-21 ENCOUNTER — Ambulatory Visit: Payer: Self-pay | Admitting: Cardiology

## 2010-02-21 LAB — CONVERTED CEMR LAB: POC INR: 1.5

## 2010-03-09 ENCOUNTER — Encounter: Payer: Self-pay | Admitting: Family Medicine

## 2010-03-10 LAB — CONVERTED CEMR LAB
AST: 18 units/L (ref 0–37)
Albumin: 3.9 g/dL (ref 3.5–5.2)
Alkaline Phosphatase: 80 units/L (ref 39–117)
Calcium: 9.5 mg/dL (ref 8.4–10.5)
Cholesterol: 160 mg/dL (ref 0–200)
Creatinine, Ser: 1.02 mg/dL (ref 0.40–1.20)
HDL: 34 mg/dL — ABNORMAL LOW (ref >39)
Total Protein: 7.1 g/dL (ref 6.0–8.3)
Triglycerides: 318 mg/dL — ABNORMAL HIGH (ref <150)

## 2010-03-16 ENCOUNTER — Ambulatory Visit: Payer: Self-pay | Admitting: Cardiology

## 2010-03-16 ENCOUNTER — Ambulatory Visit: Payer: Self-pay | Admitting: Family Medicine

## 2010-03-30 ENCOUNTER — Ambulatory Visit: Payer: Self-pay | Admitting: Internal Medicine

## 2010-03-30 DIAGNOSIS — Z950 Presence of cardiac pacemaker: Secondary | ICD-10-CM | POA: Insufficient documentation

## 2010-04-06 ENCOUNTER — Encounter (INDEPENDENT_AMBULATORY_CARE_PROVIDER_SITE_OTHER): Payer: Self-pay | Admitting: *Deleted

## 2010-04-13 ENCOUNTER — Ambulatory Visit: Payer: Self-pay | Admitting: Cardiology

## 2010-04-13 LAB — CONVERTED CEMR LAB: POC INR: 2.2

## 2010-04-18 ENCOUNTER — Encounter: Payer: Self-pay | Admitting: Family Medicine

## 2010-05-12 ENCOUNTER — Ambulatory Visit: Payer: Self-pay | Admitting: Cardiology

## 2010-06-02 ENCOUNTER — Encounter: Payer: Self-pay | Admitting: Family Medicine

## 2010-06-02 ENCOUNTER — Encounter: Payer: Self-pay | Admitting: Cardiology

## 2010-06-02 LAB — CONVERTED CEMR LAB
AST: 16 units/L
Albumin: 4.2 g/dL
Alkaline Phosphatase: 80 units/L
BUN: 20 mg/dL
Bilirubin, Direct: 0.1 mg/dL
HDL: 36 mg/dL
Potassium: 4.2 meq/L
Sodium: 141 meq/L
Total Protein: 7 g/dL
Triglycerides: 228 mg/dL

## 2010-06-03 ENCOUNTER — Encounter: Payer: Self-pay | Admitting: Family Medicine

## 2010-06-03 LAB — CONVERTED CEMR LAB
ALT: 12 units/L (ref 0–35)
Bilirubin, Direct: 0.1 mg/dL (ref 0.0–0.3)
CO2: 27 meq/L (ref 19–32)
Cholesterol: 136 mg/dL (ref 0–200)
Glucose, Bld: 114 mg/dL — ABNORMAL HIGH (ref 70–99)
Hgb A1c MFr Bld: 5.9 % — ABNORMAL HIGH (ref <5.7)
Potassium: 4.2 meq/L (ref 3.5–5.3)
Sodium: 141 meq/L (ref 135–145)
Total CHOL/HDL Ratio: 3.8
VLDL: 46 mg/dL — ABNORMAL HIGH (ref 0–40)

## 2010-06-06 LAB — CONVERTED CEMR LAB: TSH: 5.338 microintl units/mL — ABNORMAL HIGH (ref 0.350–4.500)

## 2010-06-09 ENCOUNTER — Ambulatory Visit: Payer: Self-pay | Admitting: Family Medicine

## 2010-06-09 ENCOUNTER — Ambulatory Visit: Payer: Self-pay | Admitting: Cardiology

## 2010-06-14 ENCOUNTER — Ambulatory Visit: Payer: Self-pay | Admitting: Cardiology

## 2010-06-14 ENCOUNTER — Ambulatory Visit (HOSPITAL_COMMUNITY): Admission: RE | Admit: 2010-06-14 | Discharge: 2010-06-14 | Payer: Self-pay | Admitting: Family Medicine

## 2010-06-14 ENCOUNTER — Encounter: Payer: Self-pay | Admitting: Adult Health

## 2010-06-14 DIAGNOSIS — R002 Palpitations: Secondary | ICD-10-CM | POA: Insufficient documentation

## 2010-06-14 DIAGNOSIS — I480 Paroxysmal atrial fibrillation: Secondary | ICD-10-CM | POA: Insufficient documentation

## 2010-06-16 ENCOUNTER — Encounter: Payer: Self-pay | Admitting: Cardiology

## 2010-06-16 ENCOUNTER — Ambulatory Visit: Payer: Self-pay | Admitting: Cardiology

## 2010-06-16 ENCOUNTER — Ambulatory Visit (HOSPITAL_COMMUNITY): Admission: RE | Admit: 2010-06-16 | Discharge: 2010-06-16 | Payer: Self-pay | Admitting: Cardiology

## 2010-06-20 ENCOUNTER — Telehealth: Payer: Self-pay | Admitting: Family Medicine

## 2010-06-20 ENCOUNTER — Encounter: Payer: Self-pay | Admitting: Internal Medicine

## 2010-06-20 ENCOUNTER — Ambulatory Visit: Payer: Self-pay

## 2010-06-21 ENCOUNTER — Telehealth: Payer: Self-pay | Admitting: Family Medicine

## 2010-06-21 ENCOUNTER — Encounter: Payer: Self-pay | Admitting: Family Medicine

## 2010-06-23 ENCOUNTER — Encounter: Admission: RE | Admit: 2010-06-23 | Discharge: 2010-06-23 | Payer: Self-pay | Admitting: Family Medicine

## 2010-06-23 ENCOUNTER — Ambulatory Visit: Payer: Self-pay | Admitting: Cardiology

## 2010-07-07 ENCOUNTER — Ambulatory Visit: Payer: Self-pay | Admitting: Cardiology

## 2010-07-12 ENCOUNTER — Ambulatory Visit: Payer: Self-pay | Admitting: Internal Medicine

## 2010-07-13 ENCOUNTER — Encounter: Payer: Self-pay | Admitting: Internal Medicine

## 2010-08-04 ENCOUNTER — Ambulatory Visit: Payer: Self-pay | Admitting: Cardiology

## 2010-08-04 ENCOUNTER — Encounter (INDEPENDENT_AMBULATORY_CARE_PROVIDER_SITE_OTHER): Payer: Self-pay | Admitting: *Deleted

## 2010-08-04 LAB — CONVERTED CEMR LAB: POC INR: 1.9

## 2010-08-05 ENCOUNTER — Encounter: Payer: Self-pay | Admitting: Family Medicine

## 2010-08-10 ENCOUNTER — Encounter (INDEPENDENT_AMBULATORY_CARE_PROVIDER_SITE_OTHER): Payer: Self-pay | Admitting: *Deleted

## 2010-08-16 ENCOUNTER — Ambulatory Visit: Payer: Self-pay | Admitting: Cardiology

## 2010-08-19 ENCOUNTER — Telehealth: Payer: Self-pay | Admitting: Cardiovascular Disease

## 2010-08-19 ENCOUNTER — Telehealth: Payer: Self-pay | Admitting: Family Medicine

## 2010-09-01 ENCOUNTER — Ambulatory Visit: Admission: RE | Admit: 2010-09-01 | Discharge: 2010-09-01 | Payer: Self-pay | Source: Home / Self Care

## 2010-09-16 ENCOUNTER — Telehealth: Payer: Self-pay | Admitting: Family Medicine

## 2010-09-17 ENCOUNTER — Encounter: Payer: Self-pay | Admitting: Family Medicine

## 2010-09-18 ENCOUNTER — Encounter: Payer: Self-pay | Admitting: Family Medicine

## 2010-09-19 ENCOUNTER — Encounter: Payer: Self-pay | Admitting: Family Medicine

## 2010-09-25 LAB — CONVERTED CEMR LAB: POC INR: 4.5

## 2010-09-27 NOTE — Medication Information (Signed)
Summary: ccr-lr  Anticoagulant Therapy  Managed by: Hayley Oh, [name] PCP: Dr.Margaret Sandria Senter MD: Verl Blalock MD, Marcello Moores Indication 1: Atrial Fibrillation Lab Used: [facility] [place] Site: [place] INR POC 1.9  Dietary changes: no    Health status changes: no    Bleeding/hemorrhagic complications: no    Recent/future hospitalizations: no    Any changes in medication regimen? no    Recent/future dental: no  Any missed doses?: no       Is patient compliant with meds? yes       Allergies: No Known Drug Allergies  Anticoagulation Management History:      The patient is taking warfarin and comes in today for a routine follow up visit.  Positive risk factors for bleeding include an age of 75 years or older.  Negative risk factors for bleeding include no history of CVA/TIA, no history of GI bleeding, and absence of serious comorbidities.  The bleeding index is 'intermediate risk'.  Positive CHADS2 values include History of HTN and Age > 42 years old.  Negative CHADS2 values include History of CHF, History of Diabetes, and Prior Stroke/CVA/TIA.  Anticoagulation responsible provider: Verl Blalock MD, Marcello Moores.  INR POC: 1.9.  Cuvette Lot#: [id number].    Anticoagulation Management Assessment/Plan:      The patient's current anticoagulation dose is Warfarin sodium 5 mg tabs: uad.  The target INR is 2.0-3.0.  The next INR is due 09/01/2010.  Anticoagulation instructions were given to patient.  Results were reviewed/authorized by Hayley Oh, [name].  She was notified by Hayley Oh [name].         Prior Anticoagulation Instructions: INR 1.9 Take coumadin 1 tablet tonight then resume 1/2 tablet once daily   Current Anticoagulation Instructions: INR 1.9 Increase coumadin to 2.5mg  once daily except 5mg  on Thursdays

## 2010-09-27 NOTE — Assessment & Plan Note (Signed)
Summary: 1 mth f/u per checkout on 12/13/09/tg   Visit Type:  Follow-up Primary Provider:  Dr.Margaret Moshe Cipro   History of Present Illness: [name] returns to the office as scheduled for continued assessment and treatment of sick sinus syndrome.  Since her last visit, she has done superbly.  She reports no palpitations.  She has had no lightheadedness nor dizziness.  She notes no dyspnea and no chest discomfort.  She is relatively inactive and does not drive due to problems with macular degeneration.  She denies symptoms of hyper or hypothyroidism.  Current Medications (verified): 1)  Levothyroxine Sodium 100 Mcg Tabs (Levothyroxine Sodium) .... One Tablet Mon-Thurs and 1/2 Tab Fri, Sat, and Sun 2)  Potassium 99 Mg Tabs (Potassium) .... Take 1 Tablet By Mouth Once A Day 3)  Lovastatin 40 Mg Tabs (Lovastatin) .... 2 Tablets At Bedtime 4)  Oxybutynin Chloride 5 Mg Xr24h-Tab (Oxybutynin Chloride) .... Take 1 Tablet By Mouth Once A Day 5)  Omeprazole 20 Mg Cpdr (Omeprazole) .... One Cap By Mouth Once Daily For Acid Reflux 6)  Metoprolol Tartrate 100 Mg Tabs (Metoprolol Tartrate) .... Take 1 Tablet By Mouth Two Times A Day 7)  Digoxin 0.125 Mg Tabs (Digoxin) .... Take 1 Tablet By Mouth Once A Day 8)  Warfarin Sodium 5 Mg Tabs (Warfarin Sodium) .... Uad 9)  Lorazepam 0.5 Mg Tabs (Lorazepam) .... One Tablet At Bedtime As Needed 10)  Hydrochlorothiazide 25 Mg Tabs (Hydrochlorothiazide) .... Take 1 Tablet By Mouth Once Daily 11)  Flonase 50 Mcg/act Susp (Fluticasone Propionate) .... 2 Sprays Per Nostril Daily  Allergies (verified): No Known Drug Allergies  Past History:  PMH, FH, and Social History reviewed and updated.  Review of Systems       See history of present illness.  Vital Signs:  Patient profile:   77 year old female Menstrual status:  hysterectomy Weight:      188 pounds BMI:     31.89 Pulse rate:   63 / minute BP sitting:   128 / 54  (right arm)  Vitals Entered  By: Doretha Sou, CNA (Jan 17, 2010 11:16 AM)  Physical Exam  General:    Pleasant; well developed; no acute distress:   Neck-minimal JVD; no carotid bruits; normal carotid upstrokes Lungs/Thorax-No tachypnea, no rales; no rhonchi; no wheezes; well-healed incision in the left infraclavicular region with pacing generator in a stable position; no edema nor erythema Cardiovascular-normal PMI; normal S1 and S2: Abdomen-BS normal; soft and non-tender without masses or organomegaly:  Musculoskeletal-No deformities, no cyanosis or clubbing: Neurologic-Normal cranial nerves; symmetric strength and tone:  Skin-Warm, no significant lesions: Extremities-Nl distal pulses; trace edema with mild tenderness to palpation    PPM Specifications Following MD:  Virl Axe, MD     PPM Vendor:  [employer]     PPM Model Number:  [id number]     PPM Serial Number:  [id number] DOI:  12/17/2009     PPM Implanting MD:  Virl Axe, MD  Lead 1    Location: [facility]     DOI: 12/17/2009     Model #: [id number]     Serial #: [id number]     Status: active Lead 2    Location: [facility]     DOI: 12/17/2009     Model #: [id number]     Serial #: [id number]     Status: active  Magnet Response Rate:  BOL 100 ERI 85  Indications:  Sick sinus syndrome   PPM Follow  Up Pacer Dependent:  No      Episodes Coumadin:  Yes  Parameters Mode:  DDDR     Lower Rate Limit:  60     Upper Rate Limit:  120 Paced AV Delay:  200     Sensed AV Delay:  200  Impression & Recommendations:  Problem # 1:  SICK SINUS SYNDROME (ICD-427.81) Patient is asymptomatic with appropriate rate control medication and pacing.  She is scheduled for reevaluation by Dr. Lovena Le in 3 months at which time her pacemaker can be interrogated to determine if atrial fibrillation is occurring, and if so, whether heart rate is ever excessive.  Anticoagulation will continue for the time being.  Problem # 2:  HYPERLIPIDEMIA (P102836.4) Lipid profile to be obtained.  Problem # 3:   HYPERTENSION (ICD-401.9) Blood pressure control is good with current medications.  Problem # 4:  PERIPHERAL EDEMA (ICD-782.3) Not a significant problem at the present time.  I will plan to reassess this nice woman in 7 months.  Other Orders: Hemoccult Cards (Take Home) (Hemoccult Cards) Future Orders: T-Lipid Profile [id number]) ... 01/24/2010 T-Comprehensive Metabolic Panel ([id number]) ... 01/24/2010 T-CBC w/Diff [id number]) ... 01/24/2010  EKG  Procedure date:  01/17/2010  Findings:      Rhythm Strip  Normal sinus and predominant atrial paced rhythm Heart rate of 63 beats per minute   Patient Instructions: 1)  Your physician recommends that you schedule a follow-up appointment in: 7 months 2)  Your physician recommends that you return for lab work in: next week 3)  Your physician has asked that you test your stool for blood. It is necessary to test 3 different stool specimens for accuracy. You will be given 3 hemoccult cards for specimen collection. For each stool specimen, place a small portion of stool sample (from 2 different areas of the stool) into the 2 squares on the card. Close card. Repeat with 2 more stool specimens. Bring the cards back to the office for testing.

## 2010-09-27 NOTE — Procedures (Signed)
Summary: WOUND CHECK / POST HOSP 2 WKS/TG   Current Medications (verified): 1)  Levothyroxine Sodium 100 Mcg Tabs (Levothyroxine Sodium) .... One Tablet Mon-Thurs and 1/2 Tab Fri, Sat, and Sun 2)  Potassium 99 Mg Tabs (Potassium) .... Take 1 Tablet By Mouth Once A Day 3)  Lovastatin 40 Mg Tabs (Lovastatin) .... 2 Tablets At Bedtime 4)  Oxybutynin Chloride 5 Mg Xr24h-Tab (Oxybutynin Chloride) .... Take 1 Tablet By Mouth Once A Day 5)  Omeprazole 20 Mg Cpdr (Omeprazole) .... One Cap By Mouth Once Daily For Acid Reflux 6)  Metoprolol Tartrate 100 Mg Tabs (Metoprolol Tartrate) .... Take 1 Tablet By Mouth Two Times A Day 7)  Digoxin 0.125 Mg Tabs (Digoxin) .... Take 1 Tablet By Mouth Once A Day 8)  Warfarin Sodium 5 Mg Tabs (Warfarin Sodium) .... Uad 9)  Lorazepam 0.5 Mg Tabs (Lorazepam) .... One Tablet At Bedtime As Needed 10)  Hydrochlorothiazide 25 Mg Tabs (Hydrochlorothiazide) .... Take 1/2 Tablet By Mouth Once Daily  Allergies (verified): No Known Drug Allergies  PPM Specifications Following MD:  Virl Axe, MD     PPM Vendor:  [employer]     PPM Model Number:  [id number]     PPM Serial Number:  [id number] PPM DOI:  12/17/2009     PPM Implanting MD:  Virl Axe, MD  Lead 1    Location: [facility]     DOI: 12/17/2009     Model #: [id number]     Serial #: [id number]     Status: active Lead 2    Location: [facility]     DOI: 12/17/2009     Model #: [id number]     Serial #: [id number]     Status: active  Magnet Response Rate:  BOL 100 ERI 85  Indications:  Sick sinus syndrome   PPM Follow Up Remote Check?  No Battery Voltage:  2.99 V     Battery Est. Longevity:  6.8years     Pacer Dependent:  No       PPM Device Measurements Atrium  Amplitude: 1.7 mV, Impedance: 400 ohms, Threshold: 0.75 V at 0.5 msec Right Ventricle  Amplitude: 5.2 mV, Impedance: 490 ohms, Threshold: 0.625 V at 0.5 msec  Episodes MS Episodes:  32     Percent Mode Switch:  9.1     Coumadin:  Yes Atrial Pacing:  68     Ventricular Pacing:   1.8  Parameters Mode:  DDDR     Lower Rate Limit:  60     Upper Rate Limit:  120 Paced AV Delay:  200     Sensed AV Delay:  200 Next Cardiology Appt Due:  03/28/2010 Tech Comments:  Steri strips removed by the patient.  No redness or edema noted.  32 mode switch episodes noted the longest 14:19 hours,+ coumadin, some RVR noted.  Ms. Colestock is also seeing Jory Sims today so I will discuss her ventricular rates with her.  She will return in 3 months to the [facility] office with Dr. Lovena Le. Alma Friendly, LPN  May  9, [id number] PM

## 2010-09-27 NOTE — Letter (Signed)
Summary: med review sheet  med review sheet   Imported By: Lenn Cal 06/10/2010 16:37:54  _____________________________________________________________________  External Attachment:    Type:   Image     Comment:   External Document

## 2010-09-27 NOTE — Progress Notes (Signed)
Summary: daughter questions  Phone Note Call from Patient   Summary of Call: [name] called in reference to her mother, [name], she has several questions to ask Dr. Moshe Cipro and would like for her to call her. Initial call taken by: [name],  June 20, 2010 9:30 AM  Follow-up for Phone Call        pls get a tele # to contact thios pt on and verify that there is documentation in [name]'s chart for me to discuss her care with this daughter before even sending me basck the tele # thanks Follow-up by: Hayley Nakayama MD,  June 21, 2010 1:04 PM  Additional Follow-up for Phone Call Additional follow up Details #1::        Tried calling patient, no answer.  IN her chart it does say [name] is emergency contact, but that was dated in 2006. Additional Follow-up by: [name],  June 22, 2010 4:08 PM    Additional Follow-up for Phone Call Additional follow up Details #2::    i have spoken to the pt since 06/21/2010 Follow-up by: Hayley Nakayama MD,  July 08, 2010 6:44 AM

## 2010-09-27 NOTE — Assessment & Plan Note (Signed)
Summary: EPH   Visit Type:  Follow-up Primary Provider:  Dr.Margaret Moshe Cipro  CC:  eph.  History of Present Illness: [name] is a patient of Dr Lattie Haw who was just D/C from the hospital.  She has long standing HTN and had new onset PAF.   Her synthroid dose had recently been adjusted and her TSH was mildly supressed at .3.  She converted with Dig and BB.  Echo was normal with EF 65% and mild MR.  V/Q was low probability.  There is a long standing history of asthmatic bronchitis and the patient still has some SOB with lower extremity edema.  She apparantly has had her HCTZ stopped during hospitalizaiton.  She denies SSCP, palpitaoitns or syncope.  She has been compliant with her meds.  She is to have a sleep study and ? F/U with pulmonary for desats.  I think the patient is doing well from a cardiac perspective.  She can likely stop her digoxen in 4 weeks.  We will start her back on HCTZ at 12.5mg /day  She will F/U with Dr Lattie Haw in 4 weeks to further assess her dyspnea, edema, and ? stop her digoxen and determine duration of coumadin Rx  Current Problems (verified): 1)  Anxiety  (ICD-300.00) 2)  Allergic Rhinitis Cause Unspecified  (ICD-477.9) 3)  Sleep Apnea  (ICD-780.57) 4)  Cough  (ICD-786.2) 5)  Atrial Fibrillation  (ICD-427.31) 6)  Palpitations  (ICD-785.1) 7)  Acute Bronchitis  (ICD-466.0) 8)  Fatigue  (ICD-780.79) 9)  Memory Loss  (ICD-780.93) 10)  Obesity, Unspecified  (ICD-278.00) 11)  Pancreatitis, Hx of  (ICD-V12.70) 12)  Hearing Loss  (ICD-389.9) 13)  Impaired Glucose Tolerance  (ICD-271.3) 14)  Hypothyroidism  (ICD-244.9) 15)  Hyperlipidemia  (ICD-272.4) 16)  Hypertension  (ICD-401.9)  Current Medications (verified): 1)  Levothyroxine Sodium 100 Mcg Tabs (Levothyroxine Sodium) .... One Tablet Mon-Thurs and 1/2 Tab Fri, Sat, and Sun 2)  Adult Aspirin Ec Low Strength 81 Mg Tbec (Aspirin) .... Take 1 Tablet By Mouth Once A Day 3)  Potassium 99 Mg Tabs (Potassium) .... Take  1 Tablet By Mouth Once A Day 4)  Vision Vitamins  Tabs (Multiple Vitamins-Minerals) .... One Tab By Mouth Qd 5)  Lovastatin 40 Mg Tabs (Lovastatin) .... 2 Tablets At Bedtime 6)  Oxybutynin Chloride 5 Mg Xr24h-Tab (Oxybutynin Chloride) .... Take 1 Tablet By Mouth Once A Day 7)  Omeprazole 20 Mg Cpdr (Omeprazole) .... One Cap By Mouth Once Daily For Acid Reflux 8)  Metoprolol Tartrate 100 Mg Tabs (Metoprolol Tartrate) .... Take 1 Tablet By Mouth Two Times A Day 9)  Digoxin 0.125 Mg Tabs (Digoxin) .... Take 1 Tablet By Mouth Once A Day 10)  Warfarin Sodium 5 Mg Tabs (Warfarin Sodium) .... Take 1 Tab By Mouth At Bedtime 11)  Lorazepam 0.5 Mg Tabs (Lorazepam) .... One Tablet At Bedtime As Needed 12)  Hydrochlorothiazide 25 Mg Tabs (Hydrochlorothiazide) .... Take 1/2 Tablet By Mouth Once Daily  Allergies (verified): No Known Drug Allergies  Past History:  Past Medical History: Last updated: 12/03/2009 PANCREATITIS, HX OF (ICD-V12.70) HEARING LOSS (ICD-389.9) IMPAIRED GLUCOSE TOLERANCE (ICD-271.3) HYPOTHYROIDISM (ICD-244.9) HYPERLIPIDEMIA (ICD-272.4) HYPERTENSION (ICD-401.9) Atrial fibrilation 10/2009  Past Surgical History: Last updated: 02/25/2008 partial hysterectyomy except one ovary macular degenerative surgery x3  Vital Signs:  Patient profile:   77 year old female Menstrual status:  hysterectomy Weight:      196 pounds Pulse rate:   56 / minute BP sitting:   142 / 62  (right arm)  Vitals Entered By: Doretha Sou, CNA (December 13, 2009 2:46 PM)  Physical Exam  General:  Affect appropriate Healthy:  appears stated age 25: normal Neck supple with no adenopathy JVP normal no bruits no thyromegaly Lungs clear with no wheezing and good diaphragmatic motion Heart:  S1/S2 no murmur,rub, gallop or click PMI normal Abdomen: benighn, BS positve, no tenderness, no AAA no bruit.  No HSM or HJR Distal pulses intact with no bruits Plus one bilateral  edema Neuro  non-focal Skin warm and dry    Impression & Recommendations:  Problem # 1:  ATRIAL FIBRILLATION (ICD-427.31)  Maint NSR.  continue BB consdier DC digoxen in 4 weeks Her updated medication list for this problem includes:    Adult Aspirin Ec Low Strength 81 Mg Tbec (Aspirin) .Marland Kitchen... Take 1 tablet by mouth once a day    Metoprolol Tartrate 100 Mg Tabs (Metoprolol tartrate) .Marland Kitchen... Take 1 tablet by mouth two times a day    Digoxin 0.125 Mg Tabs (Digoxin) .Marland Kitchen... Take 1 tablet by mouth once a day    Warfarin Sodium 5 Mg Tabs (Warfarin sodium) .Marland Kitchen... Take 1 tab by mouth at bedtime  Her updated medication list for this problem includes:    Adult Aspirin Ec Low Strength 81 Mg Tbec (Aspirin) .Marland Kitchen... Take 1 tablet by mouth once a day    Metoprolol Tartrate 100 Mg Tabs (Metoprolol tartrate) .Marland Kitchen... Take 1 tablet by mouth two times a day    Digoxin 0.125 Mg Tabs (Digoxin) .Marland Kitchen... Take 1 tablet by mouth once a day    Warfarin Sodium 5 Mg Tabs (Warfarin sodium) .Marland Kitchen... Take 1 tab by mouth at bedtime  Problem # 2:  ACUTE BRONCHITIS (ICD-466.0) Continued dyspnea  For sleep study. V/Q low prob in hospital with normal EF.  F/U Dr Moshe Cipro  Problem # 3:  HYPERLIPIDEMIA (ICD-272.4)  Her updated medication list for this problem includes:    Lovastatin 40 Mg Tabs (Lovastatin) .Marland Kitchen... 2 tablets at bedtime  CHOL: 122 (11/11/2009)   LDL: 42 (11/11/2009)   HDL: 42 (11/11/2009)   TG: 191 (11/11/2009)  Problem # 4:  EDEMA (ICD-782.3) Resume HCTZ and low sodium diet.  F/U RR in 4 weeks  Patient Instructions: 1)  Your physician recommends that you schedule a follow-up appointment in 4 weeks with Dr. Lattie Haw 2)  Your physician has recommended you make the following change in your medication start taking HCTZ 12.5mg  once a day  Prescriptions: HYDROCHLOROTHIAZIDE 25 MG TABS (HYDROCHLOROTHIAZIDE) take 1/2 tablet by mouth once daily  #15 x 3   Entered by:   Jeani Hawking Via LPN   Authorized by:   Bosie Clos, MD, [facility]    Signed by:   Jeani Hawking Via LPN on [id number]   Method used:   Electronically to        [facility] (retail)       [place] [address]       [place], [place]  [address]       Ph: [phone]       Fax: [phone]   RxID:   [id number]

## 2010-09-27 NOTE — Assessment & Plan Note (Signed)
Summary: SICK - ROOM 1   Vital Signs:  Patient profile:   77 year old female Menstrual status:  hysterectomy Height:      64.5 inches Weight:      191.50 pounds BMI:     32.48 O2 Sat:      97 % on Room air Pulse rate:   81 / minute Resp:     16 per minute BP sitting:   126 / 60  (left arm)  Vitals Entered By: Baldomero Lamy LPN (March 24, 624THL [id number] AM) CC: SINUS PRESSURE, CHEST CONGESTION AND SORENESS, COUGH Is Patient Diabetic? No Pain Assessment Patient in pain? no        CC:  SINUS PRESSURE, CHEST CONGESTION AND SORENESS, and COUGH.  History of Present Illness: Pt is here today with her daughter. Awoke this am with chest cong & nonprod cough. Some nasal congestion/stuffiness too.  Not blowing nose. Lt frontal HA today.  No maxillary pressure. No fever or chills. Has not tried any meds for syptoms. This has been going thru the family the last 1 1/2 mos.  Current Medications (verified): 1)  Hydrochlorothiazide 25 Mg  Tabs (Hydrochlorothiazide) .... One Tab By Mouth Once Daily 2)  Lovastatin 40 Mg  Tabs (Lovastatin) .... One Tab By Mouth At Bedtime 3)  Levothyroxine Sodium 100 Mcg Tabs (Levothyroxine Sodium) .... One Tablet Mon-Thurs and 1/2 Tab Fri, Sat, and Sun 4)  Adult Aspirin Ec Low Strength 81 Mg Tbec (Aspirin) .... Take 1 Tablet By Mouth Once A Day 5)  Potassium 99 Mg Tabs (Potassium) .... Take 1 Tablet By Mouth Once A Day 6)  Flonase 50 Mcg/act Susp (Fluticasone Propionate) .Marland Kitchen.. 1 Puff To Each Nostril Twice Daily 7)  Vision Vitamins  Tabs (Multiple Vitamins-Minerals) .... One Tab By Mouth Qd 8)  Symbicort 80-4.5 Mcg/act Aero (Budesonide-Formoterol Fumarate) .... Two Puffs Bid 9)  Amlodipine Besylate 2.5 Mg Tabs (Amlodipine Besylate) .... One Tab By Mouth Qd 10)  Lovastatin 40 Mg Tabs (Lovastatin) .... 2 Tablets At Bedtime 11)  Oxybutynin Chloride 5 Mg Xr24h-Tab (Oxybutynin Chloride) .... Take 1 Tablet By Mouth Once A Day 12)  Omeprazole 20 Mg Cpdr (Omeprazole)  .... One Cap By Mouth Once Daily For Acid Reflux  Allergies (verified): No Known Drug Allergies  Past History:  Past medical history reviewed for relevance to current acute and chronic problems.  Past Medical History: Reviewed history from 02/25/2008 and no changes required. PANCREATITIS, HX OF (ICD-V12.70) HEARING LOSS (ICD-389.9) IMPAIRED GLUCOSE TOLERANCE (ICD-271.3) HYPOTHYROIDISM (ICD-244.9) HYPERLIPIDEMIA (ICD-272.4) HYPERTENSION (ICD-401.9)  Review of Systems General:  Denies chills and fever. ENT:  Complains of nasal congestion; denies earache, postnasal drainage, sinus pressure, and sore throat. CV:  Denies chest pain or discomfort. Resp:  Complains of cough; denies shortness of breath and sputum productive.  Physical Exam  General:  Well-developed,well-nourished,in no acute distress; alert,appropriate and cooperative throughout examination Head:  Normocephalic and atraumatic without obvious abnormalities. No apparent alopecia or balding. Ears:  External ear exam shows no significant lesions or deformities.  Otoscopic examination reveals clear canals, tympanic membranes are intact bilaterally without bulging, retraction, inflammation or discharge. Hearing is grossly normal bilaterally. Nose:  no external deformity and no mucosal edema.  small amt of clear mucus bilat nares Mouth:  no erythema, no exudates, no posterior lymphoid hypertrophy, no postnasal drip, poor dentition, and teeth missing.   Neck:  No deformities, masses, or tenderness noted. Lungs:  coarse bs bilat Heart:  Normal rate and regular rhythm. S1 and S2  normal without gallop, murmur, click, rub or other extra sounds. Cervical Nodes:  No lymphadenopathy noted Psych:  Cognition and judgment appear intact. Alert and cooperative with normal attention span and concentration. No apparent delusions, illusions, hallucinations   Impression & Recommendations:  Problem # 1:  ACUTE BRONCHITIS  (ICD-466.0) Assessment New  The following medications were removed from the medication list:    Symbicort 80-4.5 Mcg/act Aero (Budesonide-formoterol fumarate) .Marland Kitchen..Marland Kitchen Two puffs bid Her updated medication list for this problem includes:    Sulfamethoxazole-trimethoprim 400-80 Mg Tabs (Sulfamethoxazole-trimethoprim) .Marland Kitchen... Take 1 two times a day x 10 days  Orders: Depo- Medrol 80mg  (J1040) Admin of Therapeutic Inj  intramuscular or subcutaneous [id number])  Problem # 2:  HYPERTENSION (ICD-401.9) Assessment: Improved  Her updated medication list for this problem includes:    Hydrochlorothiazide 25 Mg Tabs (Hydrochlorothiazide) ..... One tab by mouth once daily    Amlodipine Besylate 2.5 Mg Tabs (Amlodipine besylate) ..... One tab by mouth qd  BP today: 126/60 Prior BP: 134/80 (06/10/2009)  Labs Reviewed: K+: 4.0 (11/11/2009) Creat: : 0.86 (11/11/2009)   Chol: 122 (11/11/2009)   HDL: 42 (11/11/2009)   LDL: 42 (11/11/2009)   TG: 191 (11/11/2009)  Complete Medication List: 1)  Hydrochlorothiazide 25 Mg Tabs (Hydrochlorothiazide) .... One tab by mouth once daily 2)  Lovastatin 40 Mg Tabs (Lovastatin) .... One tab by mouth at bedtime 3)  Levothyroxine Sodium 100 Mcg Tabs (Levothyroxine sodium) .... One tablet mon-thurs and 1/2 tab fri, sat, and sun 4)  Adult Aspirin Ec Low Strength 81 Mg Tbec (Aspirin) .... Take 1 tablet by mouth once a day 5)  Potassium 99 Mg Tabs (Potassium) .... Take 1 tablet by mouth once a day 6)  Flonase 50 Mcg/act Susp (Fluticasone propionate) .Marland Kitchen.. 1 puff to each nostril twice daily 7)  Vision Vitamins Tabs (Multiple vitamins-minerals) .... One tab by mouth qd 8)  Amlodipine Besylate 2.5 Mg Tabs (Amlodipine besylate) .... One tab by mouth qd 9)  Lovastatin 40 Mg Tabs (Lovastatin) .... 2 tablets at bedtime 10)  Oxybutynin Chloride 5 Mg Xr24h-tab (Oxybutynin chloride) .... Take 1 tablet by mouth once a day 11)  Omeprazole 20 Mg Cpdr (Omeprazole) .... One cap by mouth once  daily for acid reflux 12)  Sulfamethoxazole-trimethoprim 400-80 Mg Tabs (Sulfamethoxazole-trimethoprim) .... Take 1 two times a day x 10 days  Patient Instructions: 1)  Keep your upcoming appt. 2)  It is important that you exercise regularly at least 20 minutes 5 times a week. If you develop chest pain, have severe difficulty breathing, or feel very tired , stop exercising immediately and seek medical attention. 3)  You need to lose weight. Consider a lower calorie diet and regular exercise.  4)  Get plenty of rest, drink lots of clear liquids, and use Tylenol or Ibuprofen for fever and comfort. Return in 7-10 days if you're not better:sooner if you're feeling worse. 5)  You may use the Tussionex cough medication that you have at home. 6)  You have received a shot of Depo Medrol today. Prescriptions: SULFAMETHOXAZOLE-TRIMETHOPRIM 400-80 MG TABS (SULFAMETHOXAZOLE-TRIMETHOPRIM) take 1 two times a day x 10 days  #20 x 0   Entered and Authorized by:   Kennith Gain PA   Signed by:   Kennith Gain PA on 11/18/2009   Method used:   Electronically to        [facility] (retail)       [address]  [place], [place]  [address]       Ph: [phone]       Fax: [phone]   RxID:   [id number]    Medication Administration  Injection # 1:    Medication: Depo- Medrol 80mg     Diagnosis: ACUTE BRONCHITIS (ICD-466.0)    Route: IM    Site: LUOQ gluteus    Exp Date: 11/11    Lot #: [id number]    Mfr: Pharmacia    Patient tolerated injection without complications    Given by: Baldomero Lamy LPN (March 24, 624THL 579FGE PM)  Orders Added: 1)  Depo- Medrol 80mg  [J1040] 2)  Admin of Therapeutic Inj  intramuscular or subcutaneous [[id number]] 3)  Est. Patient Level IV [id number]

## 2010-09-27 NOTE — Miscellaneous (Signed)
Summary: LABS BMP,LIPIDS,LIVER ,TSH,11/11/2009  Clinical Lists Changes  Observations: Added new observation of CALCIUM: 9.3 mg/dL (11/11/2009 9:36) Added new observation of ALBUMIN: 4.3 g/dL (11/11/2009 9:36) Added new observation of PROTEIN, TOT: 7.0 g/dL (11/11/2009 9:36) Added new observation of SGPT (ALT): 14 units/L (11/11/2009 9:36) Added new observation of SGOT (AST): 14 units/L (11/11/2009 9:36) Added new observation of ALK PHOS: 84 units/L (11/11/2009 9:36) Added new observation of BILI DIRECT: 0.1 mg/dL (11/11/2009 9:36) Added new observation of CREATININE: 0.86 mg/dL (11/11/2009 9:36) Added new observation of BUN: 20 mg/dL (11/11/2009 9:36) Added new observation of BG RANDOM: 99 mg/dL (11/11/2009 9:36) Added new observation of CO2 PLSM/SER: 25 meq/L (11/11/2009 9:36) Added new observation of CL SERUM: 103 meq/L (11/11/2009 9:36) Added new observation of K SERUM: 4.0 meq/L (11/11/2009 9:36) Added new observation of NA: 140 meq/L (11/11/2009 9:36) Added new observation of LDL: 42 mg/dL (11/11/2009 9:36) Added new observation of HDL: 42 mg/dL (11/11/2009 9:36) Added new observation of TRIGLYC TOT: 191 mg/dL (11/11/2009 9:36) Added new observation of CHOLESTEROL: 122 mg/dL (11/11/2009 9:36) Added new observation of TSH: 0.037 microintl units/mL (11/11/2009 9:36)

## 2010-09-27 NOTE — Letter (Signed)
Summary: Discharge Summary  Discharge Summary   Imported By: Dierdre Harness 12/24/2009 13:26:43  _____________________________________________________________________  External Attachment:    Type:   Image     Comment:   External Document

## 2010-09-27 NOTE — Miscellaneous (Signed)
Summary: Home Care Report  Home Care Report   Imported By: Dierdre Harness 12/15/2009 14:56:33  _____________________________________________________________________  External Attachment:    Type:   Image     Comment:   External Document

## 2010-09-27 NOTE — Procedures (Signed)
Summary: pcp   Current Medications (verified): 1)  Potassium 99 Mg Tabs (Potassium) .... Take 1 Tablet By Mouth Once A Day 2)  Lovastatin 40 Mg Tabs (Lovastatin) .... 2 Tablets At Bedtime 3)  Oxybutynin Chloride 5 Mg Xr24h-Tab (Oxybutynin Chloride) .... Take 1 Tablet By Mouth Once A Day 4)  Omeprazole 20 Mg Cpdr (Omeprazole) .... One Cap By Mouth Once Daily For Acid Reflux 5)  Metoprolol Tartrate 100 Mg Tabs (Metoprolol Tartrate) .... Take 1 Tablet By Mouth Two Times A Day 6)  Digoxin 0.125 Mg Tabs (Digoxin) .... Take 1 Tablet By Mouth Once A Day 7)  Warfarin Sodium 5 Mg Tabs (Warfarin Sodium) .... Uad 8)  Lorazepam 0.5 Mg Tabs (Lorazepam) .... One Tablet At Bedtime As Needed 9)  Hydrochlorothiazide 25 Mg Tabs (Hydrochlorothiazide) .... Take 1 Tablet By Mouth Once Daily 10)  Flonase 50 Mcg/act Susp (Fluticasone Propionate) .... 2 Sprays Per Nostril Daily 11)  Levothyroxine Sodium 100 Mcg Tabs (Levothyroxine Sodium) .... One Tablet By Mouth Daily 12)  Vitamin D 1000 Unit Tabs (Cholecalciferol) .... Take 1 Tablet By Mouth Once A Day 13)  Vitamin B-12 1000 Mcg Tabs (Cyanocobalamin) .... Take 1 Tablet By Mouth Once A Day 14)  Vitamin E 1000 Unit Caps (Vitamin E) .... 2 Tabs Once Daily  Allergies (verified): No Known Drug Allergies  PPM Specifications Following MD:  Virl Axe, MD     PPM Vendor:  [employer] Jude     PPM Model Number:  [id number]     PPM Serial Number:  [id number] PPM DOI:  12/17/2009     PPM Implanting MD:  Virl Axe, MD  Lead 1    Location: RA     DOI: 12/17/2009     Model #: [id number]     Serial #: [id number]     Status: active Lead 2    Location: RV     DOI: 12/17/2009     Model #: [id number]     Serial #: [id number]     Status: active  Magnet Response Rate:  BOL 100 ERI 85  Indications:  Sick sinus syndrome   PPM Follow Up Battery Voltage:  2.95 V     Battery Est. Longevity:  7.3-7.7 yrs     Pacer Dependent:  No       PPM Device Measurements Atrium  Amplitude: 3.0 mV, Impedance:  400 ohms, Threshold: 0.75 V at 0.5 msec Right Ventricle  Amplitude: 5.8 mV, Impedance: 580 ohms, Threshold: 0.875 V at 0.5 msec  Episodes MS Episodes:  3247     Percent Mode Switch:  3.3%     Coumadin:  Yes Ventricular High Rate:  0     Atrial Pacing:  83%     Ventricular Pacing:  45%  Parameters Mode:  DDDR     Lower Rate Limit:  60     Upper Rate Limit:  100 Paced AV Delay:  250     Sensed AV Delay:  250 Tech Comments:  PT HAVING SOB AND FLUTTER FEELING IN CHEST.  3247 AMS EPISODES SINCE LAST CHECK ON 03-30-10. + COUMADIN.  NORMAL DEVICE FUNCTION.  NO CHANGES MADE. ROV IN APRIL W/GT IN RDS.  PT TO SEE KATHRYN LAWRENCE ON 06-23-10. Shelly Bombard  June 20, 2010 3:34 PM

## 2010-09-27 NOTE — Miscellaneous (Signed)
Summary: Orders Update  Clinical Lists Changes  Orders: Added new Test order of T- Hemoglobin A1C [id number]) - Signed

## 2010-09-27 NOTE — Medication Information (Signed)
Summary: ccr-at pacer check-lr  Anticoagulant Therapy  Managed by: Edrick Oh, RN PCP: Dr.Margaret Sandria Senter MD: Lattie Haw MD, Herbie [place] Indication 1: Atrial Fibrillation Lab Used: [facility] [place] Site: [place] INR POC 6.2  Dietary changes: yes       Details: denies increased Vit K  Only doing 1 serving q week  Health status changes: no    Bleeding/hemorrhagic complications: no    Recent/future hospitalizations: no    Any changes in medication regimen? yes       Details: meds reviewed.  No changes  Recent/future dental: no  Any missed doses?: yes     Details: States she has been taking coumadin correctly.  Verified tablet strength and coumadin schedule  Is patient compliant with meds? yes      Comments: Denies ETOH intake.  Discussed bleeding prcautions with pt.  Allergies: No Known Drug Allergies  Anticoagulation Management History:      The patient is taking warfarin and comes in today for a routine follow up visit.  Positive risk factors for bleeding include an age of 75 years or older.  Negative risk factors for bleeding include no history of CVA/TIA, no history of GI bleeding, and absence of serious comorbidities.  The bleeding index is 'intermediate risk'.  Positive CHADS2 values include History of HTN and Age > 75 years old.  Negative CHADS2 values include History of CHF, History of Diabetes, and Prior Stroke/CVA/TIA.  Anticoagulation responsible provider: Lattie Haw MD, Herbie [place].  INR POC: 6.2.  Cuvette Lot#: [id number].    Anticoagulation Management Assessment/Plan:      The patient's current anticoagulation dose is Warfarin sodium 5 mg tabs: uad.  The target INR is 2.0-3.0.  The next INR is due 01/06/2010.  Anticoagulation instructions were given to patient.  Results were reviewed/authorized by Edrick Oh, RN.  She was notified by Edrick Oh RN.         Prior Anticoagulation Instructions: INR 3.2 Decrease coumadin to 2.5mg  once daily except 5mg  on  Sundays, Wednesdays and Fridays  Current Anticoagulation Instructions: INR 6.2 Hold coumadin x 3 days and recheck on Thursday

## 2010-09-27 NOTE — Medication Information (Signed)
Summary: CCN PT 22.2 INR 1.96  Anticoagulant Therapy  Managed by: Edrick Oh, RN Supervising MD: Lattie Haw MD, Herbie [place] Indication 1: Atrial Fibrillation Lab Used: [facility] [place] Site: [place] INR POC 2.5  Dietary changes: no    Health status changes: no    Bleeding/hemorrhagic complications: no    Recent/future hospitalizations: yes       Details: Pt in [facility] 11/26/09 - 12/01/09 with Atrial Fib with RVR  Any changes in medication regimen? yes       Details: Started on Coumadin 5mg  qd  Discharge INR was 2.4  Recent/future dental: no  Any missed doses?: no       Is patient compliant with meds? yes      Comments: Pt has coumadin teaching in the hospital.  Pt verbalized understanding and had no further questions.  Allergies: No Known Drug Allergies  Anticoagulation Management History:      The patient comes in today for her initial visit for anticoagulation therapy.  Positive risk factors for bleeding include an age of 50 years or older.  Negative risk factors for bleeding include no history of CVA/TIA, no history of GI bleeding, and absence of serious comorbidities.  The bleeding index is 'intermediate risk'.  Positive CHADS2 values include History of HTN and Age > 84 years old.  Negative CHADS2 values include History of CHF, History of Diabetes, and Prior Stroke/CVA/TIA.  Anticoagulation responsible provider: Lattie Haw MD, Herbie [place].  INR POC: 2.5.  Cuvette Lot#: [id number].    Anticoagulation Management Assessment/Plan:      The patient's current anticoagulation dose is Warfarin sodium 5 mg tabs: Take 1 tab by mouth at bedtime.  The target INR is 2.0-3.0.  The next INR is due 12/13/2009.  Anticoagulation instructions were given to patient.  Results were reviewed/authorized by Edrick Oh, RN.  She was notified by Edrick Oh RN.         Current Anticoagulation Instructions: INR 2.5 Continue coumadin 5mg  once daily

## 2010-09-27 NOTE — Miscellaneous (Signed)
  Clinical Lists Changes  Medications: Added new medication of LEVOTHYROXINE SODIUM 100 MCG TABS (LEVOTHYROXINE SODIUM) one and half tablets on Saturday and Sunday, one tablet Monday through Friday - Signed Rx of LEVOTHYROXINE SODIUM 100 MCG TABS (LEVOTHYROXINE SODIUM) one and half tablets on Saturday and Sunday, one tablet Monday through Friday;  #32 x 4;  Signed;  Entered by: Tula Nakayama MD;  Authorized by: Tula Nakayama MD;  Method used: Historical    Prescriptions: LEVOTHYROXINE SODIUM 100 MCG TABS (LEVOTHYROXINE SODIUM) one and half tablets on Saturday and Sunday, one tablet Monday through Friday  #32 x 4   Entered and Authorized by:   Tula Nakayama MD   Signed by:   Tula Nakayama MD on 08/05/2010   Method used:   Historical   [id number]

## 2010-09-27 NOTE — Miscellaneous (Signed)
Summary: refill  Clinical Lists Changes  Medications: Rx of OXYBUTYNIN CHLORIDE 5 MG XR24H-TAB (OXYBUTYNIN CHLORIDE) Take 1 tablet by mouth once a day;  #30 x 2;  Signed;  Entered by: Jimmey Ralph LPN;  Authorized by: Tula Nakayama MD;  Method used: Electronically to [facility] [place], [place], [place], [place]  [address], Ph: [phone], Fax: [phone]    Prescriptions: OXYBUTYNIN CHLORIDE 5 MG XR24H-TAB (OXYBUTYNIN CHLORIDE) Take 1 tablet by mouth once a day  #30 x 2   Entered by:   Jimmey Ralph LPN   Authorized by:   Tula Nakayama MD   Signed by:   Jimmey Ralph LPN on 579FGE   Method used:   Electronically to        [facility] (retail)       [place] [address]       [place], [place]  [address]       Ph: [phone]       Fax: [phone]   RxID:   [id number]

## 2010-09-27 NOTE — Assessment & Plan Note (Signed)
Summary: uti? - room 2   Vital Signs:  Patient profile:   77 year old female Menstrual status:  hysterectomy Height:      64.5 inches Weight:      189.25 pounds BMI:     32.10 O2 Sat:      96 % on Room air Pulse rate:   76 / minute Resp:     16 per minute BP sitting:   140 / 70  (left arm)  Vitals Entered By: Baldomero Lamy LPN (May 17, 624THL [id number] PM) CC: increased urinary frequency, burning with urination Is Patient Diabetic? No Pain Assessment Patient in pain? no      Comments did not bring meds to ov   Primary Provider:  Dr.Margaret Moshe Cipro  CC:  increased urinary frequency and burning with urination.  History of Present Illness: This 77 Years Old White Female comes in today complaining of urinary symptoms starting yesterday.  She reports dysuria.  No frequency.  Denies abd or back pain.  No fever or chillls.  Overall is feeling well.  Has more energy now with her pacemaker.  She had her sleep study last night.  She has a follow up appt with Dr Moshe Cipro next week.     Allergies (verified): No Known Drug Allergies  Past History:  Past medical history reviewed for relevance to current acute and chronic problems.  Past Medical History: Reviewed history from 12/03/2009 and no changes required. PANCREATITIS, HX OF (ICD-V12.70) HEARING LOSS (ICD-389.9) IMPAIRED GLUCOSE TOLERANCE (ICD-271.3) HYPOTHYROIDISM (ICD-244.9) HYPERLIPIDEMIA (ICD-272.4) HYPERTENSION (ICD-401.9) Atrial fibrilation 10/2009  Review of Systems General:  Denies chills and fever. CV:  Denies chest pain or discomfort and palpitations. GI:  Denies abdominal pain, indigestion, nausea, and vomiting. GU:  Complains of dysuria; denies nocturia and urinary frequency.  Physical Exam  General:  Well-developed,well-nourished,in no acute distress; alert,appropriate and cooperative throughout examination Head:  Normocephalic and atraumatic without obvious abnormalities. No apparent alopecia or  balding. Ears:  External ear exam shows no significant lesions or deformities.  Otoscopic examination reveals clear canals, tympanic membranes are intact bilaterally without bulging, retraction, inflammation or discharge. Hearing is grossly normal bilaterally. Nose:  External nasal examination shows no deformity or inflammation. Nasal mucosa are pink and moist without lesions or exudates. Mouth:  Oral mucosa and oropharynx without lesions or exudates.  poor dentition and teeth missing.   Neck:  No deformities, masses, or tenderness noted. Lungs:  Normal respiratory effort, chest expands symmetrically. Lungs are clear to auscultation, no crackles or wheezes. Heart:  Normal rate and regular rhythm. S1 and S2 normal without gallop, murmur, click, rub or other extra sounds. Abdomen:  soft, non-tender, and no masses.   Msk:  Neg [employer]:  alert & oriented X3 and gait normal.     Impression & Recommendations:  Problem # 1:  CYSTITIS, ACUTE (ICD-595.0) Assessment New  Her updated medication list for this problem includes:    Oxybutynin Chloride 5 Mg Xr24h-tab (Oxybutynin chloride) .Marland Kitchen... Take 1 tablet by mouth once a day    Sulfamethoxazole-tmp Ds 800-160 Mg Tabs (Sulfamethoxazole-trimethoprim) .Marland Kitchen... Take 1 two times a day for 3 days  Orders: T-Culture, Urine [id number])  Complete Medication List: 1)  Levothyroxine Sodium 100 Mcg Tabs (Levothyroxine sodium) .... One tablet mon-thurs and 1/2 tab fri, sat, and sun 2)  Potassium 99 Mg Tabs (Potassium) .... Take 1 tablet by mouth once a day 3)  Lovastatin 40 Mg Tabs (Lovastatin) .... 2 tablets at bedtime 4)  Oxybutynin Chloride 5 Mg  Xr24h-tab (Oxybutynin chloride) .... Take 1 tablet by mouth once a day 5)  Omeprazole 20 Mg Cpdr (Omeprazole) .... One cap by mouth once daily for acid reflux 6)  Metoprolol Tartrate 100 Mg Tabs (Metoprolol tartrate) .... Take 1 tablet by mouth two times a day 7)  Digoxin 0.125 Mg Tabs (Digoxin) .... Take 1  tablet by mouth once a day 8)  Warfarin Sodium 5 Mg Tabs (Warfarin sodium) .... Uad 9)  Lorazepam 0.5 Mg Tabs (Lorazepam) .... One tablet at bedtime as needed 10)  Hydrochlorothiazide 25 Mg Tabs (Hydrochlorothiazide) .... Take 1 tablet by mouth once daily 11)  Flonase 50 Mcg/act Susp (Fluticasone propionate) .... 2 sprays per nostril daily 12)  Sulfamethoxazole-tmp Ds 800-160 Mg Tabs (Sulfamethoxazole-trimethoprim) .... Take 1 two times a day for 3 days  Other Orders: Urinalysis [id number])  Patient Instructions: 1)  Keep your next appt with Dr Moshe Cipro 2)  I have prescribed an antibiotic for your urinary infection. 3)  Increase your fluids. 4)  I am glad you're feeling better! Prescriptions: SULFAMETHOXAZOLE-TMP DS 800-160 MG TABS (SULFAMETHOXAZOLE-TRIMETHOPRIM) take 1 two times a day for 3 days  #6 x 0   Entered and Authorized by:   Kennith Gain PA   Signed by:   Kennith Gain PA on 01/11/2010   Method used:   Electronically to        [facility] (retail)       [place] [address]       [place], [place]  [address]       Ph: [phone]       Fax: [phone]   RxID:   [id number]   Laboratory Results   Urine Tests  Date/Time Received: Jan 11, 2010 4:06 PM Date/Time Reported: Jan 11, 2010 4:06 PM   Routine Urinalysis   Color: yellow Appearance: Clear Glucose: 100   (Normal Range: Negative) Bilirubin: negative   (Normal Range: Negative) Ketone: negative   (Normal Range: Negative) Spec. Gravity: <1.005   (Normal Range: 1.003-1.035) Blood: trace-lysed   (Normal Range: Negative) pH: 5.0   (Normal Range: 5.0-8.0) Protein: trace   (Normal Range: Negative) Urobilinogen: 1.0   (Normal Range: 0-1) Nitrite: positive   (Normal Range: Negative) Leukocyte Esterace: negative   (Normal Range: Negative)

## 2010-09-27 NOTE — Medication Information (Signed)
Summary: ccr-lr  Anticoagulant Therapy  Managed by: Edrick Oh, RN PCP: Dr.Margaret Sandria Senter MD: Lattie Haw MD, Herbie [place] Indication 1: Atrial Fibrillation Lab Used: [facility] [place] Site: [place] INR POC 2.6  Dietary changes: no    Health status changes: no    Bleeding/hemorrhagic complications: no    Recent/future hospitalizations: no    Any changes in medication regimen? no    Recent/future dental: no  Any missed doses?: no       Is patient compliant with meds? yes       Allergies: No Known Drug Allergies  Anticoagulation Management History:      The patient is taking warfarin and comes in today for a routine follow up visit.  Positive risk factors for bleeding include an age of 75 years or older.  Negative risk factors for bleeding include no history of CVA/TIA, no history of GI bleeding, and absence of serious comorbidities.  The bleeding index is 'intermediate risk'.  Positive CHADS2 values include History of HTN and Age > 29 years old.  Negative CHADS2 values include History of CHF, History of Diabetes, and Prior Stroke/CVA/TIA.  Anticoagulation responsible provider: Lattie Haw MD, Herbie [place].  INR POC: 2.6.  Cuvette Lot#: [id number].    Anticoagulation Management Assessment/Plan:      The patient's current anticoagulation dose is Warfarin sodium 5 mg tabs: uad.  The target INR is 2.0-3.0.  The next INR is due 01/17/2010.  Anticoagulation instructions were given to patient.  Results were reviewed/authorized by Edrick Oh, RN.  She was notified by Edrick Oh RN.         Prior Anticoagulation Instructions: INR 6.2 Hold coumadin x 3 days and recheck on Thursday  Current Anticoagulation Instructions: INR 2.6 Decrease coumadin to 2.5mg  once daily except 5mg  on Sundays

## 2010-09-27 NOTE — Cardiovascular Report (Signed)
Summary: Office Visit   Office Visit   Imported By: Sallee Provencal 07/01/2010 09:39:44  _____________________________________________________________________  External Attachment:    Type:   Image     Comment:   External Document

## 2010-09-27 NOTE — Letter (Signed)
Summary: MEDICAL NECESSITY OXYGEN  MEDICAL NECESSITY OXYGEN   Imported By: Dierdre Harness 12/29/2009 11:15:59  _____________________________________________________________________  External Attachment:    Type:   Image     Comment:   External Document

## 2010-09-27 NOTE — Letter (Signed)
Summary: Appointment - Missed   [facility] [place]  [address] [place]   [place], [place] [address]   Phone: [phone]  Fax: [phone]     April 06, 2010 MRN: [id number]   JYLIAN WEISHUHN [id number] [place], [place]  [address]   Dear Ms. Batalla,  Our records indicate you missed your appointment on      April 06, 2010                     with Dr.   Domenic Polite.  It is very important that we reach you to reschedule this appointment. We look forward to participating in your health care needs. Please contact us at the number listed above at your earliest convenience to reschedule this appointment.     Sincerely,    Public relations account executive

## 2010-09-27 NOTE — Letter (Signed)
Summary: breast center appt.  breast center appt.   Imported By: Eliezer Mccoy 07/06/2010 10:38:52  _____________________________________________________________________  External Attachment:    Type:   Image     Comment:   External Document

## 2010-09-27 NOTE — Medication Information (Signed)
Summary: ccr-lr  Anticoagulant Therapy  Managed by: Edrick Oh, RN PCP: Dr.Margaret Sandria Senter MD: Lattie Haw MD, Herbie [place] Indication 1: Atrial Fibrillation Lab Used: [facility] [place] Site: [place] INR POC 3.2  Dietary changes: no    Health status changes: no    Bleeding/hemorrhagic complications: no    Recent/future hospitalizations: no    Any changes in medication regimen? no    Recent/future dental: no  Any missed doses?: no       Is patient compliant with meds? yes       Allergies: No Known Drug Allergies  Anticoagulation Management History:      The patient is taking warfarin and comes in today for a routine follow up visit.  Positive risk factors for bleeding include an age of 75 years or older.  Negative risk factors for bleeding include no history of CVA/TIA, no history of GI bleeding, and absence of serious comorbidities.  The bleeding index is 'intermediate risk'.  Positive CHADS2 values include History of HTN and Age > 23 years old.  Negative CHADS2 values include History of CHF, History of Diabetes, and Prior Stroke/CVA/TIA.  Anticoagulation responsible provider: Lattie Haw MD, Herbie [place].  INR POC: 3.2.  Cuvette Lot#: [id number].    Anticoagulation Management Assessment/Plan:      The patient's current anticoagulation dose is Warfarin sodium 5 mg tabs: uad.  The target INR is 2.0-3.0.  The next INR is due 01/03/2010.  Anticoagulation instructions were given to patient.  Results were reviewed/authorized by Edrick Oh, RN.  She was notified by Edrick Oh RN.         Prior Anticoagulation Instructions: INR 1.9 S/P pacer implant Continue coumadin 5mg  once daily except 2.5mg  on Tuesdays, Thursdays and Saturdays  Current Anticoagulation Instructions: INR 3.2 Decrease coumadin to 2.5mg  once daily except 5mg  on Sundays, Wednesdays and Fridays

## 2010-09-27 NOTE — Progress Notes (Signed)
Summary: DAUGHTER WANTS YOU TO CALL HER [name]  Phone Note Call from Patient   Summary of Call: [name] TO CALL HER AT [phone]   [facility] Initial call taken by: Dierdre Harness,  June 21, 2010 2:00 PM  Follow-up for Phone Call        states pt continues to c/o irregular heart rate, with increased dyspnea, moreso in the afternoon, seems moore tired than usual, resting more often. Saw dr [name] yesterday and was told  that her lungs are not the problem. I let daughter kniow i will send a flag to dr Lovena Le.uncertain if there is any indication for holter monitor. Also I recommend [place], which she is having tomorrow Follow-up by: Tula Nakayama MD,  June 22, 2010 10:43 AM

## 2010-09-27 NOTE — Medication Information (Signed)
Summary: ccr-lr  Anticoagulant Therapy  Managed by: Edrick Oh, RN PCP: Dr.Margaret Sandria Senter MD: Lattie Haw MD, Herbie Baltimore Indication 1: Atrial Fibrillation Lab Used: [facility] [place] Site: [place] INR POC 2.3  Dietary changes: no    Health status changes: no    Bleeding/hemorrhagic complications: no    Recent/future hospitalizations: no    Any changes in medication regimen? no    Recent/future dental: no  Any missed doses?: no       Is patient compliant with meds? yes       Allergies: No Known Drug Allergies  Anticoagulation Management History:      The patient is taking warfarin and comes in today for a routine follow up visit.  Positive risk factors for bleeding include an age of 75 years or older.  Negative risk factors for bleeding include no history of CVA/TIA, no history of GI bleeding, and absence of serious comorbidities.  The bleeding index is 'intermediate risk'.  Positive CHADS2 values include History of HTN and Age > 3 years old.  Negative CHADS2 values include History of CHF, History of Diabetes, and Prior Stroke/CVA/TIA.  Anticoagulation responsible provider: Lattie Haw MD, Herbie Baltimore.  INR POC: 2.3.  Cuvette Lot#: [id number].    Anticoagulation Management Assessment/Plan:      The patient's current anticoagulation dose is Warfarin sodium 5 mg tabs: uad.  The target INR is 2.0-3.0.  The next INR is due 06/09/2010.  Anticoagulation instructions were given to patient.  Results were reviewed/authorized by Edrick Oh, RN.  She was notified by Edrick Oh RN.         Prior Anticoagulation Instructions: INR 2.2 Continue coumadin 2.5mg  once daily   Current Anticoagulation Instructions: INR 2.3 Continue coumadin 2.5mg  once daily

## 2010-09-27 NOTE — Assessment & Plan Note (Signed)
Summary: 3 mth fu per checkout on 01/03/10/tg   Visit Type:  Follow-up Primary Provider:  Dr.Margaret Moshe Cipro  CC:  no cardiology complaints.  History of Present Illness: [name] returns today for PPM followup.  She is a pleasant 77 yo woman with a h/o symptomatic tachybrady syndrome, s/p PPM.  She feels well. No c/p, sob, or peripheral edema.  No syncope.  She does admit to sodium indicretion.  Current Medications (verified): 1)  Potassium 99 Mg Tabs (Potassium) .... Take 1 Tablet By Mouth Once A Day 2)  Lovastatin 40 Mg Tabs (Lovastatin) .... 2 Tablets At Bedtime 3)  Oxybutynin Chloride 5 Mg Xr24h-Tab (Oxybutynin Chloride) .... Take 1 Tablet By Mouth Once A Day 4)  Omeprazole 20 Mg Cpdr (Omeprazole) .... One Cap By Mouth Once Daily For Acid Reflux 5)  Metoprolol Tartrate 100 Mg Tabs (Metoprolol Tartrate) .... Take 1 Tablet By Mouth Two Times A Day 6)  Digoxin 0.125 Mg Tabs (Digoxin) .... Take 1 Tablet By Mouth Once A Day 7)  Warfarin Sodium 5 Mg Tabs (Warfarin Sodium) .... Uad 8)  Lorazepam 0.5 Mg Tabs (Lorazepam) .... One Tablet At Bedtime As Needed 9)  Hydrochlorothiazide 25 Mg Tabs (Hydrochlorothiazide) .... Take 1 Tablet By Mouth Once Daily 10)  Flonase 50 Mcg/act Susp (Fluticasone Propionate) .... 2 Sprays Per Nostril Daily 11)  Levothyroxine Sodium 100 Mcg Tabs (Levothyroxine Sodium) .... One Tablet Monday Through Saturday, and Half Tablet On Sunday  Allergies (verified): No Known Drug Allergies  Past History:  Past Medical History: Last updated: 01/17/2010 Sick sinus syndrome: Atrial fibrilation 10/2009; and dual chamber pacemaker in 4/11; normal EF Anticoagulation HYPERLIPIDEMIA (ICD-272.4) HYPERTENSION (ICD-401.9) PANCREATITIS-2008 Gastroesophageal reflux disease HEARING LOSS (ICD-389.9) IMPAIRED GLUCOSE TOLERANCE (ICD-271.3) HYPOTHYROIDISM (ICD-244.9) Macular degeneration Nocturnal oxygen started in 2011  Past Surgical History: Last updated:  01/17/2010 Hysterectomy with right salpingo-oophorectomy1970s Bilateral cataract extraction Dual-chamber pacemaker implanted-2011  Review of Systems  The patient denies chest pain, syncope, dyspnea on exertion, and peripheral edema.    Vital Signs:  Patient profile:   77 year old female Menstrual status:  hysterectomy Weight:      190 pounds BMI:     32 .23 Pulse rate:   80 / minute BP sitting:   140 / 66  (right arm)  Vitals Entered By: Doretha Sou, CNA (March 30, 2010 10:01 AM)  Physical Exam  General:  Well-developed,well-nourished,in no acute distress; alert,appropriate and cooperative throughout examination HEENT: No facial asymmetry,  EOMI, No sinus tenderness, TM's Clear, oropharynx  pink and moist.   Chest: Clear to auscultation bilaterally.  Well healed PPM incision. CVS: S1, S2, No murmurs, No S3.   Abd: Soft, Nontender.  MS: Adequate ROM spine, hips, shoulders and knees.  Ext: No edema.   CNS: CN 2-12 intact, power tone and sensation normal throughout.   Skin: Intact, no visible lesions or rashes.  Psych: Good eye contact, normal affect.  Memory intact, not anxious or depressed appearing.    PPM Specifications Following MD:  Virl Axe, MD     PPM Vendor:  [employer]     PPM Model Number:  [id number]     PPM Serial Number:  [id number] PPM DOI:  12/17/2009     PPM Implanting MD:  Virl Axe, MD  Lead 1    Location: RA     DOI: 12/17/2009     Model #: [id number]     Serial #: [id number]     Status: active Lead 2    Location: RV  DOI: 12/17/2009     Model #: [id number]     Serial #: [id number]     Status: active  Magnet Response Rate:  BOL 100 ERI 85  Indications:  Sick sinus syndrome   PPM Follow Up Remote Check?  No Battery Voltage:  2.95 V     Battery Est. Longevity:  7.8 years     Pacer Dependent:  No       PPM Device Measurements Atrium  Amplitude: 1.4 mV, Impedance: 430 ohms, Threshold: 1.0 V at 0.5 msec Right Ventricle  Amplitude: 5.0 mV, Impedance: 550 ohms,  Threshold: 0.875 V at 0.5 msec  Episodes MS Episodes:  422     Percent Mode Switch:  <1%     Coumadin:  Yes Atrial Pacing:  81%     Ventricular Pacing:  1.4%  Parameters Mode:  DDDR     Lower Rate Limit:  60     Upper Rate Limit:  120 Paced AV Delay:  250     Sensed AV Delay:  250 Next Cardiology Appt Due:  11/27/2010 Tech Comments:  422 mode switch episodes are questionable due to FFRW sensing.  RA reprogrammed for chronic thresholds.   PVARP reprogrammed 337msec.   ROV 4/12 with Dr. Lovena Le in [place]. Alma Friendly, LPN  August  3, 624THL 10:40 AM  MD Comments:  Agree with above.  Impression & Recommendations:  Problem # 1:  CARDIAC PACEMAKER IN SITU (ICD-V45.01) Her device is working normally except for minimal far field oversensing in the atria.  Reprogramming was made.  Problem # 2:  HYPERTENSION (ICD-401.9) Her blood pressure was elevated a bit but she has continued dietary indiscretion.  I encouraged her to stay away from salty foods. Her updated medication list for this problem includes:    Metoprolol Tartrate 100 Mg Tabs (Metoprolol tartrate) .Marland Kitchen... Take 1 tablet by mouth two times a day    Hydrochlorothiazide 25 Mg Tabs (Hydrochlorothiazide) .Marland Kitchen... Take 1 tablet by mouth once daily  Problem # 3:  HYPERLIPIDEMIA (ICD-272.4) she will continue a low fat diet and medication as below. Her updated medication list for this problem includes:    Lovastatin 40 Mg Tabs (Lovastatin) .Marland Kitchen... 2 tablets at bedtime  Patient Instructions: 1)  Your physician recommends that you schedule a follow-up appointment in: next April

## 2010-09-27 NOTE — Assessment & Plan Note (Signed)
Summary: office visit   Vital Signs:  Patient profile:   77 year old female Menstrual status:  hysterectomy Height:      64.5 inches Weight:      187.50 pounds BMI:     31.80 O2 Sat:      97 % Pulse rate:   78 / minute Pulse rhythm:   regular Resp:     16 per minute BP sitting:   120 / 60  (left arm) Cuff size:   large  Vitals Entered By: Kate Sable LPN (May 19, 624THL 624THL AM)  Nutrition Counseling: Patient's BMI is greater than 25 and therefore counseled on weight management options. CC: Follow up chronic problems   Primary Care Provider:  Dr.Margaret Moshe Cipro  CC:  Follow up chronic problems.  History of Present Illness: Reports  that she has been improving significantly over the past several; weeks, and her family agree. She had a sleep study as well as will have her lung function test tomorrow, and will see the cardiologistnext month. She has already had a pacemaker check recently. She is now using nocturnal oxygen. Denies recent fever or chills. Denies sinus pressure, nasal congestion , ear pain or sore throat. Denies chest congestion, or cough productive of sputum. Denies chest pain, palpitations, PND, orthopnea or leg swelling. Denies abdominal pain, nausea, vomitting, diarrhea or constipation. Denies change in bowel movements or bloody stool. Denies dysuria , frequency, incontinence or hesitancy.Recently treated for a UTI.Currently asymptomatic. Denies  joint pain, swelling, or reduced mobility. Denies headaches, vertigo, seizures. Denies depression, anxiety or insomnia. Denies  rash, lesions, or itch.       Current Medications (verified): 1)  Levothyroxine Sodium 100 Mcg Tabs (Levothyroxine Sodium) .... One Tablet Mon-Thurs and 1/2 Tab Fri, Sat, and Sun 2)  Potassium 99 Mg Tabs (Potassium) .... Take 1 Tablet By Mouth Once A Day 3)  Lovastatin 40 Mg Tabs (Lovastatin) .... 2 Tablets At Bedtime 4)  Oxybutynin Chloride 5 Mg Xr24h-Tab (Oxybutynin Chloride) ....  Take 1 Tablet By Mouth Once A Day 5)  Omeprazole 20 Mg Cpdr (Omeprazole) .... One Cap By Mouth Once Daily For Acid Reflux 6)  Metoprolol Tartrate 100 Mg Tabs (Metoprolol Tartrate) .... Take 1 Tablet By Mouth Two Times A Day 7)  Digoxin 0.125 Mg Tabs (Digoxin) .... Take 1 Tablet By Mouth Once A Day 8)  Warfarin Sodium 5 Mg Tabs (Warfarin Sodium) .... Uad 9)  Lorazepam 0.5 Mg Tabs (Lorazepam) .... One Tablet At Bedtime As Needed 10)  Hydrochlorothiazide 25 Mg Tabs (Hydrochlorothiazide) .... Take 1 Tablet By Mouth Once Daily 11)  Flonase 50 Mcg/act Susp (Fluticasone Propionate) .... 2 Sprays Per Nostril Daily 12)  Sulfamethoxazole-Tmp Ds 800-160 Mg Tabs (Sulfamethoxazole-Trimethoprim) .... Take 1 Two Times A Day For 3 Days  Allergies (verified): No Known Drug Allergies  Past History:  Past Medical History: PANCREATITIS, HX OF (ICD-V12.70) HEARING LOSS (ICD-389.9) IMPAIRED GLUCOSE TOLERANCE (ICD-271.3) HYPOTHYROIDISM (ICD-244.9) HYPERLIPIDEMIA (ICD-272.4) HYPERTENSION (ICD-401.9) Atrial fibrilation 10/2009 Nocturnal oxygen started in 2011  Past Surgical History: partial hysterectyomy except one ovary macular degenerative surgery x3 Pacemaker placement 2011  Review of Systems      See HPI Eyes:  Denies blurring and discharge. Psych:  Denies anxiety and depression. Endo:  Denies excessive thirst and excessive urination. Heme:  Denies abnormal bruising and bleeding; currently oin coumadin for atrial fib. Allergy:  Denies hives or rash and itching eyes.  Physical Exam  General:  Well-developed,well-nourished,in no acute distress; alert,appropriate and cooperative throughout examination HEENT: No  facial asymmetry,  EOMI, No sinus tenderness, TM's Clear, oropharynx  pink and moist.   Chest: Clear to auscultation bilaterally.  CVS: S1, S2, No murmurs, No S3.   Abd: Soft, Nontender.  MS: Adequate ROM spine, hips, shoulders and knees.  Ext: No edema.   CNS: CN 2-12 intact, power  tone and sensation normal throughout.   Skin: Intact, no visible lesions or rashes.  Psych: Good eye contact, normal affect.  Memory intact, not anxious or depressed appearing.    Impression & Recommendations:  Problem # 1:  CYSTITIS, ACUTE (ICD-595.0) Assessment Improved  Her updated medication list for this problem includes:    Oxybutynin Chloride 5 Mg Xr24h-tab (Oxybutynin chloride) .Marland Kitchen... Take 1 tablet by mouth once a day    Sulfamethoxazole-tmp Ds 800-160 Mg Tabs (Sulfamethoxazole-trimethoprim) .Marland Kitchen... Take 1 two times a day for 3 days  Problem # 2:  ANXIETY (ICD-300.00) Assessment: Improved  Her updated medication list for this problem includes:    Lorazepam 0.5 Mg Tabs (Lorazepam) ..... One tablet at bedtime as needed  Problem # 3:  ATRIAL FIBRILLATION (ICD-427.31) Assessment: Improved  Her updated medication list for this problem includes:    Metoprolol Tartrate 100 Mg Tabs (Metoprolol tartrate) .Marland Kitchen... Take 1 tablet by mouth two times a day    Digoxin 0.125 Mg Tabs (Digoxin) .Marland Kitchen... Take 1 tablet by mouth once a day    Warfarin Sodium 5 Mg Tabs (Warfarin sodium) ..... Uad  Problem # 4:  HYPOTHYROIDISM (ICD-244.9) Assessment: Comment Only  Her updated medication list for this problem includes:    Levothyroxine Sodium 100 Mcg Tabs (Levothyroxine sodium) ..... One tablet mon-thurs and 1/2 tab fri, sat, and sun  Orders: T-TSH [id number])  Labs Reviewed: TSH: 0.037 (11/11/2009)    HgBA1c: 5.6 (06/10/2009) Chol: 122 (11/11/2009)   HDL: 42 (11/11/2009)   LDL: 42 (11/11/2009)   TG: 191 (11/11/2009)  Problem # 5:  HYPERTENSION (ICD-401.9) Assessment: Improved  Her updated medication list for this problem includes:    Metoprolol Tartrate 100 Mg Tabs (Metoprolol tartrate) .Marland Kitchen... Take 1 tablet by mouth two times a day    Hydrochlorothiazide 25 Mg Tabs (Hydrochlorothiazide) .Marland Kitchen... Take 1 tablet by mouth once daily  Orders: T-Basic Metabolic Panel ([id number])  BP today:  120/60 Prior BP: 140/70 (01/11/2010)  Labs Reviewed: K+: 4.0 (11/11/2009) Creat: : 0.86 (11/11/2009)   Chol: 122 (11/11/2009)   HDL: 42 (11/11/2009)   LDL: 42 (11/11/2009)   TG: 191 (11/11/2009)  Problem # 6:  HYPERLIPIDEMIA (ICD-272.4) Assessment: Comment Only  Her updated medication list for this problem includes:    Lovastatin 40 Mg Tabs (Lovastatin) .Marland Kitchen... 2 tablets at bedtime  Orders: T-Hepatic Function [id number]) T-Lipid Profile [id number])  Labs Reviewed: SGOT: 14 (11/11/2009)   SGPT: 14 (11/11/2009)   HDL:42 (11/11/2009), 42 (11/11/2009)  LDL:42 (11/11/2009), 42 (11/11/2009)  Chol:122 (11/11/2009), 122 (11/11/2009)  Trig:191 (11/11/2009), 191 (11/11/2009)  Complete Medication List: 1)  Levothyroxine Sodium 100 Mcg Tabs (Levothyroxine sodium) .... One tablet mon-thurs and 1/2 tab fri, sat, and sun 2)  Potassium 99 Mg Tabs (Potassium) .... Take 1 tablet by mouth once a day 3)  Lovastatin 40 Mg Tabs (Lovastatin) .... 2 tablets at bedtime 4)  Oxybutynin Chloride 5 Mg Xr24h-tab (Oxybutynin chloride) .... Take 1 tablet by mouth once a day 5)  Omeprazole 20 Mg Cpdr (Omeprazole) .... One cap by mouth once daily for acid reflux 6)  Metoprolol Tartrate 100 Mg Tabs (Metoprolol tartrate) .... Take 1 tablet by mouth two times a day 7)  Digoxin 0.125 Mg Tabs (Digoxin) .... Take 1 tablet by mouth once a day 8)  Warfarin Sodium 5 Mg Tabs (Warfarin sodium) .... Uad 9)  Lorazepam 0.5 Mg Tabs (Lorazepam) .... One tablet at bedtime as needed 10)  Hydrochlorothiazide 25 Mg Tabs (Hydrochlorothiazide) .... Take 1 tablet by mouth once daily 11)  Flonase 50 Mcg/act Susp (Fluticasone propionate) .... 2 sprays per nostril daily 12)  Sulfamethoxazole-tmp Ds 800-160 Mg Tabs (Sulfamethoxazole-trimethoprim) .... Take 1 two times a day for 3 days  Other Orders: T-Vitamin D (25-Hydroxy) [id number])  Patient Instructions: 1)  F?U in early  to mid [place]  2)  You are doing very well. 3)  It is a  good thing to go outside and do a little gardening with help, increase activity GRADUALLy. 4)  BMP prior to visit, ICD-9: 5)  Hepatic Panel prior to visit, ICD-9:  fasing in July 6)  Lipid Panel prior to visit, ICD-9: 7)  TSH prior to visit, ICD-9: 8)  Vitamin D

## 2010-09-27 NOTE — Medication Information (Signed)
Summary: ccr-lr  Anticoagulant Therapy  Managed by: Edrick Oh, RN PCP: Dr.Margaret Sandria Senter MD: Verl Blalock MD, Marcello Moores Indication 1: Atrial Fibrillation Lab Used: [facility] [place] Site: [place] INR POC 2.3  Dietary changes: no    Health status changes: no    Bleeding/hemorrhagic complications: no    Recent/future hospitalizations: no    Any changes in medication regimen? no    Recent/future dental: no  Any missed doses?: no       Is patient compliant with meds? yes       Allergies: No Known Drug Allergies  Anticoagulation Management History:      The patient is taking warfarin and comes in today for a routine follow up visit.  Positive risk factors for bleeding include an age of 7 years or older.  Negative risk factors for bleeding include no history of CVA/TIA, no history of GI bleeding, and absence of serious comorbidities.  The bleeding index is 'intermediate risk'.  Positive CHADS2 values include History of HTN and Age > 27 years old.  Negative CHADS2 values include History of CHF, History of Diabetes, and Prior Stroke/CVA/TIA.  Anticoagulation responsible provider: Verl Blalock MD, Marcello Moores.  INR POC: 2.3.  Cuvette Lot#: [id number].    Anticoagulation Management Assessment/Plan:      The patient's current anticoagulation dose is Warfarin sodium 5 mg tabs: uad.  The target INR is 2.0-3.0.  The next INR is due 04/06/2010.  Anticoagulation instructions were given to patient.  Results were reviewed/authorized by Edrick Oh, RN.  She was notified by Edrick Oh RN.         Prior Anticoagulation Instructions: INR 1.5 Take coumadin 1 tablet tonight then increase dose to 2.5mg  once daily   Current Anticoagulation Instructions: INR 2.3 Continue coumadin 2.5mg  once daily

## 2010-09-27 NOTE — Progress Notes (Signed)
Summary: speak with doc  Phone Note Call from Patient   Summary of Call: would like to speak with doc about her daughter getting medicines. Sheral Flow [phone] Initial call taken by: Lenn Cal,  December 16, 2009 9:45 AM  Follow-up for Phone Call        spoke with daughter and advised her that patient needs to sign a medical release if she wishes for [place] to speak to anyone [name] her healthcare Follow-up by: Baldomero Lamy LPN,  April 21, 624THL 10:26 AM

## 2010-09-27 NOTE — Medication Information (Signed)
Summary: CCR  Anticoagulant Therapy  Managed by: Edrick Oh, RN PCP: Dr.Margaret Sandria Senter MD: Verl Blalock MD, Marcello Moores Indication 1: Atrial Fibrillation Lab Used: [facility] [place] Site: [place] INR POC 2.2  Dietary changes: no    Health status changes: no    Bleeding/hemorrhagic complications: no    Recent/future hospitalizations: no    Any changes in medication regimen? no    Recent/future dental: no  Any missed doses?: no       Is patient compliant with meds? yes       Allergies: No Known Drug Allergies  Anticoagulation Management History:      The patient is taking warfarin and comes in today for a routine follow up visit.  Positive risk factors for bleeding include an age of 75 years or older.  Negative risk factors for bleeding include no history of CVA/TIA, no history of GI bleeding, and absence of serious comorbidities.  The bleeding index is 'intermediate risk'.  Positive CHADS2 values include History of HTN and Age > 40 years old.  Negative CHADS2 values include History of CHF, History of Diabetes, and Prior Stroke/CVA/TIA.  Anticoagulation responsible provider: Verl Blalock MD, Marcello Moores.  INR POC: 2.2.    Anticoagulation Management Assessment/Plan:      The patient's current anticoagulation dose is Warfarin sodium 5 mg tabs: uad.  The target INR is 2.0-3.0.  The next INR is due 05/11/2010.  Anticoagulation instructions were given to patient.  Results were reviewed/authorized by Edrick Oh, RN.  She was notified by Edrick Oh RN.         Prior Anticoagulation Instructions: INR 2.3 Continue coumadin 2.5mg  once daily   Current Anticoagulation Instructions: INR 2.2 Continue coumadin 2.5mg  once daily

## 2010-09-27 NOTE — Cardiovascular Report (Signed)
Summary: Office Visit   Office Visit   Imported By: Sallee Provencal 01/18/2010 16:07:41  _____________________________________________________________________  External Attachment:    Type:   Image     Comment:   External Document

## 2010-09-27 NOTE — Medication Information (Signed)
Summary: ccr-at [name] Cancel appt-lr  Anticoagulant Therapy  Managed by: Edrick Oh, RN Supervising MD: [name] Cancel MD, Collier Salina Indication 1: Atrial Fibrillation Lab Used: [facility] [place] Site: [place] INR POC 4.5  Dietary changes: no    Health status changes: no    Bleeding/hemorrhagic complications: no    Recent/future hospitalizations: no    Any changes in medication regimen? no    Recent/future dental: no  Any missed doses?: no       Is patient compliant with meds? yes       Allergies: No Known Drug Allergies  Anticoagulation Management History:      The patient is taking warfarin and comes in today for a routine follow up visit.  Positive risk factors for bleeding include an age of 75 years or older.  Negative risk factors for bleeding include no history of CVA/TIA, no history of GI bleeding, and absence of serious comorbidities.  The bleeding index is 'intermediate risk'.  Positive CHADS2 values include History of HTN and Age > 9 years old.  Negative CHADS2 values include History of CHF, History of Diabetes, and Prior Stroke/CVA/TIA.  Anticoagulation responsible provider: Johnsie Cancel MD, Collier Salina.  INR POC: 4.5.  Cuvette Lot#: [id number].    Anticoagulation Management Assessment/Plan:      The patient's current anticoagulation dose is Warfarin sodium 5 mg tabs: Take 1 tab by mouth at bedtime.  The target INR is 2.0-3.0.  The next INR is due 12/20/2009.  Anticoagulation instructions were given to patient.  Results were reviewed/authorized by Edrick Oh, RN.  She was notified by Edrick Oh RN.         Prior Anticoagulation Instructions: INR 2.5 Continue coumadin 5mg  once daily   Current Anticoagulation Instructions: INR 4.5 Hold coumadin tonight then decrease dose to 5mg  once daily except 2.5mg  on Tuesdays, Thursdays and Saturdays

## 2010-09-27 NOTE — Assessment & Plan Note (Signed)
Summary: sick-room 1   Vital Signs:  Patient profile:   77 year old female Menstrual status:  hysterectomy Height:      64.5 inches Weight:      197.75 pounds BMI:     33.54 O2 Sat:      97 % on Room air Pulse rate:   48 / minute Resp:     16 per minute BP sitting:   132 / 62  (left arm)  Vitals Entered By: Baldomero Lamy LPN (April 20, 624THL [id number] PM) CC: runny nose, cough Is Patient Diabetic? No Pain Assessment Patient in pain? no        Primary Provider:  Dr.Margaret Moshe Cipro  CC:  runny nose and cough.  History of Present Illness: Pt is here today with 2 of her daughters.  They have noticed over the last 2 days that she seems to be declining.  The swelling in her legs is worsening despite initiation of HCTZ 1/2 tab daiy by cardiologist 2 days ago.  Seems to be much more tired, & less energy.  She goes in her room to lie down & rest which is unlike her.  And has also been using her O2 a lot more.    She has the same nasal congestion & cough that she has had.  No change Saw Dr Luan Pulling 1 week ago.  Is scheduled for a PFT and sleep study.  Pt was discharged from the hospital on 12-01-09.  Admitted for atrial fibrillation.  Current Medications (verified): 1)  Levothyroxine Sodium 100 Mcg Tabs (Levothyroxine Sodium) .... One Tablet Mon-Thurs and 1/2 Tab Fri, Sat, and Sun 2)  Potassium 99 Mg Tabs (Potassium) .... Take 1 Tablet By Mouth Once A Day 3)  Lovastatin 40 Mg Tabs (Lovastatin) .... 2 Tablets At Bedtime 4)  Oxybutynin Chloride 5 Mg Xr24h-Tab (Oxybutynin Chloride) .... Take 1 Tablet By Mouth Once A Day 5)  Omeprazole 20 Mg Cpdr (Omeprazole) .... One Cap By Mouth Once Daily For Acid Reflux 6)  Metoprolol Tartrate 100 Mg Tabs (Metoprolol Tartrate) .... Take 1 Tablet By Mouth Two Times A Day 7)  Digoxin 0.125 Mg Tabs (Digoxin) .... Take 1 Tablet By Mouth Once A Day 8)  Warfarin Sodium 5 Mg Tabs (Warfarin Sodium) .... Uad 9)  Lorazepam 0.5 Mg Tabs (Lorazepam) .... One Tablet At  Bedtime As Needed 10)  Hydrochlorothiazide 25 Mg Tabs (Hydrochlorothiazide) .... Take 1/2 Tablet By Mouth Once Daily  Allergies (verified): No Known Drug Allergies  Past History:  Past medical history reviewed for relevance to current acute and chronic problems.  Past Medical History: Reviewed history from 12/03/2009 and no changes required. PANCREATITIS, HX OF (ICD-V12.70) HEARING LOSS (ICD-389.9) IMPAIRED GLUCOSE TOLERANCE (ICD-271.3) HYPOTHYROIDISM (ICD-244.9) HYPERLIPIDEMIA (ICD-272.4) HYPERTENSION (ICD-401.9) Atrial fibrilation 10/2009  Review of Systems General:  Denies chills and fever. ENT:  Complains of nasal congestion; denies sinus pressure and sore throat. CV:  Complains of shortness of breath with exertion and swelling of feet; denies chest pain or discomfort and palpitations. Resp:  Complains of cough and shortness of breath.  Physical Exam  General:  Well-developed,well-nourished,in no acute distress; alert,appropriate and cooperative throughout examination.  Nasal cannula O2.  Head:  Normocephalic and atraumatic without obvious abnormalities. No apparent alopecia or balding. Ears:  External ear exam shows no significant lesions or deformities.  Otoscopic examination reveals clear canals, tympanic membranes are intact bilaterally without bulging, retraction, inflammation or discharge. Hearing is grossly normal bilaterally. Nose:  no external deformity, no mucosal edema, and  mucosal erythema.   Mouth:  Oral mucosa and oropharynx without lesions or exudates.  poor dentition and teeth missing.   Neck:  No deformities, masses, or tenderness noted. Lungs:  Normal respiratory effort, chest expands symmetrically. Lungs are clear to auscultation, no crackles or wheezes. Heart:  regular rhythm, no murmur, and bradycardia.   Extremities:  2+ left pedal edema and 2+ right pedal edema.   Neurologic:  alert & oriented X3.     Impression & Recommendations:  Problem # 1:   BRADYCARDIA (ICD-427.89) Assessment New  The following medications were removed from the medication list:    Adult Aspirin Ec Low Strength 81 Mg Tbec (Aspirin) .Marland Kitchen... Take 1 tablet by mouth once a day Her updated medication list for this problem includes:    Metoprolol Tartrate 100 Mg Tabs (Metoprolol tartrate) .Marland Kitchen... Take 1 tablet by mouth two times a day    Warfarin Sodium 5 Mg Tabs (Warfarin sodium) ..... Uad  Problem # 2:  PERIPHERAL EDEMA (ICD-782.3) Assessment: Deteriorated 1.75 # wt gain in last 2 days despite initiation of HCTZ.  Her updated medication list for this problem includes:    Hydrochlorothiazide 25 Mg Tabs (Hydrochlorothiazide) .Marland Kitchen... Take 1/2 tablet by mouth once daily  Problem # 3:  ATRIAL FIBRILLATION (ICD-427.31) Assessment: Improved  The following medications were removed from the medication list:    Adult Aspirin Ec Low Strength 81 Mg Tbec (Aspirin) .Marland Kitchen... Take 1 tablet by mouth once a day Her updated medication list for this problem includes:    Metoprolol Tartrate 100 Mg Tabs (Metoprolol tartrate) .Marland Kitchen... Take 1 tablet by mouth two times a day    Digoxin 0.125 Mg Tabs (Digoxin) .Marland Kitchen... Take 1 tablet by mouth once a day    Warfarin Sodium 5 Mg Tabs (Warfarin sodium) ..... Uad  Complete Medication List: 1)  Levothyroxine Sodium 100 Mcg Tabs (Levothyroxine sodium) .... One tablet mon-thurs and 1/2 tab fri, sat, and sun 2)  Potassium 99 Mg Tabs (Potassium) .... Take 1 tablet by mouth once a day 3)  Lovastatin 40 Mg Tabs (Lovastatin) .... 2 tablets at bedtime 4)  Oxybutynin Chloride 5 Mg Xr24h-tab (Oxybutynin chloride) .... Take 1 tablet by mouth once a day 5)  Omeprazole 20 Mg Cpdr (Omeprazole) .... One cap by mouth once daily for acid reflux 6)  Metoprolol Tartrate 100 Mg Tabs (Metoprolol tartrate) .... Take 1 tablet by mouth two times a day 7)  Digoxin 0.125 Mg Tabs (Digoxin) .... Take 1 tablet by mouth once a day 8)  Warfarin Sodium 5 Mg Tabs (Warfarin sodium) ....  Uad 9)  Lorazepam 0.5 Mg Tabs (Lorazepam) .... One tablet at bedtime as needed 10)  Hydrochlorothiazide 25 Mg Tabs (Hydrochlorothiazide) .... Take 1/2 tablet by mouth once daily  Patient Instructions: 1)  Go directly to ER for evaluation.

## 2010-09-27 NOTE — Progress Notes (Signed)
Summary: lab  Phone Note Call from Patient   Summary of Call: pt needs to get lab work before she comes in on the 01/13/2010 [id number] Initial call taken by: Lenn Cal,  Jan 03, 2010 9:44 AM  Follow-up for Phone Call        what labs would you like for this patient to have? Follow-up by: Baldomero Lamy LPN,  May  9, 624THL [id number] AM  Additional Follow-up for Phone Call Additional follow up Details #1::        I don't see anything that she needs to have done before her appt, unless pt is aware of something that needs to be done.  Additional Follow-up by: Kennith Gain PA,  Jan 03, 2010 11:28 AM    Additional Follow-up for Phone Call Additional follow up Details #2::    returned call, no answer Follow-up by: Baldomero Lamy LPN,  May  9, 624THL 075-GRM AM  Additional Follow-up for Phone Call Additional follow up Details #3:: Details for Additional Follow-up Action Taken: returned call, left message Additional Follow-up by: Baldomero Lamy LPN,  May 10, 624THL 075-GRM AM

## 2010-09-27 NOTE — Progress Notes (Signed)
Summary: Office Visit  Office Visit   Imported By: Dierdre Harness 12/14/2009 14:05:09  _____________________________________________________________________  External Attachment:    Type:   Image     Comment:   External Document

## 2010-09-27 NOTE — Miscellaneous (Signed)
Summary: Device preload  Clinical Lists Changes  Observations: Added new observation of PPM INDICATN: Sick sinus syndrome (12/23/2009 10:18) Added new observation of MAGNET RTE: BOL 100 ERI 85 (12/23/2009 10:18) Added new observation of PPMLEADSTAT2: active (12/23/2009 10:18) Added new observation of PPMLEADSER2: [id number] (12/23/2009 10:18) Added new observation of PPMLEADMOD2: 2088TC (12/23/2009 10:18) Added new observation of PPMLEADLOC2: RV (12/23/2009 10:18) Added new observation of PPMLEADSTAT1: active (12/23/2009 10:18) Added new observation of PPMLEADSER1: [id number] (12/23/2009 10:18) Added new observation of PPMLEADMOD1: 2088TC (12/23/2009 10:18) Added new observation of PPMLEADLOC1: RA (12/23/2009 10:18) Added new observation of PPM IMP MD: Virl Axe, MD (12/23/2009 10:18) Added new observation of PPMLEADDOI2: 12/17/2009 (12/23/2009 10:18) Added new observation of PPMLEADDOI1: 12/17/2009 (12/23/2009 10:18) Added new observation of PPM DOI: 12/17/2009 (12/23/2009 10:18) Added new observation of PPM SERL#: [id number]  (12/23/2009 10:18) Added new observation of PPM MODL#: [id number]  (12/23/2009 10:18) Added new observation of PACEMAKERMFG: St Jude  (12/23/2009 10:18) Added new observation of PACEMAKER MD: Virl Axe, MD  (12/23/2009 10:18)      PPM Specifications Following MD:  Virl Axe, MD     PPM Vendor:  St Jude     PPM Model Number:  [id number]     PPM Serial Number:  [id number] DOI:  12/17/2009     PPM Implanting MD:  Virl Axe, MD  Lead 1    Location: [facility]     DOI: 12/17/2009     Model #: [id number]     Serial #: [id number]     Status: active Lead 2    Location: [facility]     DOI: 12/17/2009     Model #: [id number]     Serial #: [id number]     Status: active  Magnet Response Rate:  BOL 100 ERI 85  Indications:  Sick sinus syndrome

## 2010-09-27 NOTE — Letter (Signed)
Summary: lab add on  lab add on   Imported By: Luann Bullins 06/07/2010 08:48:34  _____________________________________________________________________  External Attachment:    Type:   Image     Comment:   External Document

## 2010-09-27 NOTE — Letter (Signed)
Summary: quadcane  quadcane   Imported By: Dierdre Harness 04/18/2010 12:54:26  _____________________________________________________________________  External Attachment:    Type:   Image     Comment:   External Document

## 2010-09-27 NOTE — Miscellaneous (Signed)
Summary: Home Care Report  Home Care Report   Imported By: Dierdre Harness 01/11/2010 09:42:24  _____________________________________________________________________  External Attachment:    Type:   Image     Comment:   External Document

## 2010-09-27 NOTE — Assessment & Plan Note (Signed)
Summary: ROV TACHY   Visit Type:  Follow-up Primary Provider:  Dr.Margaret Moshe Cipro   History of Present Illness: [name] is a 64 CF with known history of SSS s/p St. Jude dual chamber pacemaker (DDDR), Atrial fibrillation on coumadin, hypothyroidism and hypertension who presents to the office today for evaluation of increased palpatations. She has her pacemaker interrogated in April of 2011 and she states there were changes to the parameters at that time.  Since then she has noticed more palpatations and heart racing.  She usually feels this at night when she is lying down or at rest.  She reported this to Dr. Moshe Cipro who asked [place] to see her.  Her daughter who is with her and his her primary caretaker states she has noticed that her breathing status has also changed, as she is more easily dyspnec.  The patient denies that this is a problem. No chest pain or fatigue is associated with this symptoms.  Current Medications (verified): 1)  Potassium 99 Mg Tabs (Potassium) .... Take 1 Tablet By Mouth Once A Day 2)  Lovastatin 40 Mg Tabs (Lovastatin) .... 2 Tablets At Bedtime 3)  Oxybutynin Chloride 5 Mg Xr24h-Tab (Oxybutynin Chloride) .... Take 1 Tablet By Mouth Once A Day 4)  Omeprazole 20 Mg Cpdr (Omeprazole) .... One Cap By Mouth Once Daily For Acid Reflux 5)  Metoprolol Tartrate 100 Mg Tabs (Metoprolol Tartrate) .... Take 1 Tablet By Mouth Two Times A Day 6)  Digoxin 0.125 Mg Tabs (Digoxin) .... Take 1 Tablet By Mouth Once A Day 7)  Warfarin Sodium 5 Mg Tabs (Warfarin Sodium) .... Uad 8)  Lorazepam 0.5 Mg Tabs (Lorazepam) .... One Tablet At Bedtime As Needed 9)  Hydrochlorothiazide 25 Mg Tabs (Hydrochlorothiazide) .... Take 1 Tablet By Mouth Once Daily 10)  Flonase 50 Mcg/act Susp (Fluticasone Propionate) .... 2 Sprays Per Nostril Daily 11)  Levothyroxine Sodium 100 Mcg Tabs (Levothyroxine Sodium) .... One Tablet By Mouth Daily 12)  Vitamin D 1000 Unit Tabs (Cholecalciferol) .... Take 1 Tablet  By Mouth Once A Day 13)  Vitamin B-12 1000 Mcg Tabs (Cyanocobalamin) .... Take 1 Tablet By Mouth Once A Day 14)  Vitamin E 1000 Unit Caps (Vitamin E) .... 2 Tabs Once Daily  Allergies (verified): No Known Drug Allergies  Comments:  Nurse/Medical Assistant: patient brought med list and we reviewed med list also previous list from previous ov the only change is levothyroxine 100mg  mon-thru sat ,1/2 on sunday now she is taking levothyroxine 100 mg daily.  Past History:  Past medical, surgical, family and social histories (including risk factors) reviewed, and no changes noted (except as noted below).  Past Medical History: Reviewed history from 01/17/2010 and no changes required. Sick sinus syndrome: Atrial fibrilation 10/2009; and dual chamber pacemaker in 4/11; normal EF Anticoagulation HYPERLIPIDEMIA (ICD-272.4) HYPERTENSION (ICD-401.9) PANCREATITIS-2008 Gastroesophageal reflux disease HEARING LOSS (ICD-389.9) IMPAIRED GLUCOSE TOLERANCE (ICD-271.3) HYPOTHYROIDISM (ICD-244.9) Macular degeneration Nocturnal oxygen started in 2011  Past Surgical History: Reviewed history from 01/17/2010 and no changes required. Hysterectomy with right salpingo-oophorectomy1970s Bilateral cataract extraction Dual-chamber pacemaker implanted-2011  Family History: Reviewed history from 02/25/2008 and no changes required. SIX LIVING CHILDREN ONE CHILD DECEASED OF MALIGNANT MELANOMA MOTHER DECEASED /  GENTIAL CANCER FATHER DECEASED  THROAT CANCER ONE SISTER  DECEASED / PNEUMONIA ONE SISTER LIVING  HEALTHY  Social History: Reviewed history from 02/25/2008 and no changes required. Retired Brewing technologist Drug use-no past history of alcohol and tobacco  Review of Systems       palpatations,  mild DOE  Vital Signs:  Patient profile:   77 year old female Menstrual status:  hysterectomy Weight:      188 pounds BMI:     31.89 Pulse rate:   58 / minute Pulse (ortho):   58 / minute BP  sitting:   115 / 62  (right arm) BP standing:   109 / 63  Vitals Entered By: Doretha Sou, CNA (June 14, 2010 10:34 AM)  Serial Vital Signs/Assessments:  Time      Position  BP       Pulse  Resp  Temp     By 10:56 AM  Lying RA  100/60   49                    Sandy Neugent, CNA 10:56 AM  Sitting   103/55   [address] CNA 10:56 AM  Standing  109/63   [address] CNA   Physical Exam  General:  Well developed, well nourished, in no acute distress. Lungs:  Clear bilaterally to auscultation and percussion. Heart:  Non-displaced PMI, chest non-tender; regular rate and rhythm, S1, S2 without murmurs, rubs or gallops. Carotid upstroke normal, no bruit. Normal abdominal aortic size, no bruits. Femorals normal pulses, no bruits. Pedals normal pulses. No edema, no varicosities. Abdomen:  Bowel sounds positive; abdomen soft and non-tender without masses, organomegaly, or hernias noted. No hepatosplenomegaly. Msk:  Back normal, normal gait. Muscle strength and tone normal. Pulses:  pulses normal in all 4 extremities Extremities:  No clubbing or cyanosis. Neurologic:  Alert and oriented x 3. Psych:  Normal affect.   PPM Specifications Following MD:  Virl Axe, MD     PPM Vendor:  St Jude     PPM Model Number:  [id number]     PPM Serial Number:  [id number] PPM DOI:  12/17/2009     PPM Implanting MD:  Virl Axe, MD  Lead 1    Location: [facility]     DOI: 12/17/2009     Model #: [id number]     Serial #: [id number]     Status: active Lead 2    Location: [facility]     DOI: 12/17/2009     Model #: [id number]     Serial #: [id number]     Status: active  Magnet Response Rate:  BOL 100 ERI 85  Indications:  Sick sinus syndrome   PPM Follow Up Pacer Dependent:  No      Episodes Coumadin:  Yes  Parameters Mode:  DDDR     Lower Rate Limit:  60     Upper Rate Limit:  120 Paced AV Delay:  250     Sensed AV Delay:  250  Impression & Recommendations:  Problem # 1:   PALPITATIONS (ICD-785.1) She is experienceing increased palpatations and increased HR at times since having pacemaker interrogated on last visit with Dr. Lovena Le.  I will have her Baskin interrogated in [place] with recommendations for medication changes at that time if necessary.  No medications changes will be done until PMK evlauated.  Echocardiogram will also be done for LV fx in the setting of increased dyspnea. Her updated medication list for this problem includes:    Metoprolol Tartrate 100 Mg Tabs (Metoprolol tartrate) .Marland KitchenMarland KitchenMarland KitchenMarland Kitchen  Take 1 tablet by mouth two times a day    Warfarin Sodium 5 Mg Tabs (Warfarin sodium) ..... Uad  Problem # 2:  ATRIAL FIBRILLATION (ICD-427.31) Currently controlled on medications but above interrogation will assist in this. She is to continue coumadin as directed. Her updated medication list for this problem includes:    Metoprolol Tartrate 100 Mg Tabs (Metoprolol tartrate) .Marland Kitchen... Take 1 tablet by mouth two times a day    Digoxin 0.125 Mg Tabs (Digoxin) .Marland Kitchen... Take 1 tablet by mouth once a day    Warfarin Sodium 5 Mg Tabs (Warfarin sodium) ..... Uad  Other Orders: 2-D Echocardiogram (2D Echo)  Patient Instructions: 1)  Your physician recommends that you schedule a follow-up appointment in: post test 2)  Your physician recommends that you continue on your current medications as directed. Please refer to the Current Medication list given to you today. 3)  Your physician has requested that you have an echocardiogram.  Echocardiography is a painless test that uses sound waves to create images of your heart. It provides your doctor with information about the size and shape of your heart and how well your heart's chambers and valves are working.  This procedure takes approximately one hour. There are no restrictions for this procedure.

## 2010-09-27 NOTE — Medication Information (Signed)
Summary: ccr-lr  Anticoagulant Therapy  Managed by: Edrick Oh, RN PCP: Dr.Margaret Sandria Senter MD: Lattie Haw MD, Herbie [place] Indication 1: Atrial Fibrillation Lab Used: [facility] [place] Site: [place] INR POC 1.5  Dietary changes: no    Health status changes: no    Bleeding/hemorrhagic complications: no    Recent/future hospitalizations: no    Any changes in medication regimen? no    Recent/future dental: no  Any missed doses?: yes     Details: Missed 1 dose Saturday night  Is patient compliant with meds? yes       Allergies: No Known Drug Allergies  Anticoagulation Management History:      The patient is taking warfarin and comes in today for a routine follow up visit.  Positive risk factors for bleeding include an age of 75 years or older.  Negative risk factors for bleeding include no history of CVA/TIA, no history of GI bleeding, and absence of serious comorbidities.  The bleeding index is 'intermediate risk'.  Positive CHADS2 values include History of HTN and Age > 75 years old.  Negative CHADS2 values include History of CHF, History of Diabetes, and Prior Stroke/CVA/TIA.  Anticoagulation responsible provider: Lattie Haw MD, Herbie [place].  INR POC: 1.5.  Cuvette Lot#: [id number].    Anticoagulation Management Assessment/Plan:      The patient's current anticoagulation dose is Warfarin sodium 5 mg tabs: uad.  The target INR is 2.0-3.0.  The next INR is due 03/16/2010.  Anticoagulation instructions were given to patient.  Results were reviewed/authorized by Edrick Oh, RN.  She was notified by Edrick Oh RN.         Prior Anticoagulation Instructions: INR 3.7 Decrease coumadin to 2.5mg  once daily except none on Wednesdays  Current Anticoagulation Instructions: INR 1.5 Take coumadin 1 tablet tonight then increase dose to 2.5mg  once daily

## 2010-09-27 NOTE — Assessment & Plan Note (Signed)
Summary: certification   Allergies: No Known Drug Allergies   Complete Medication List: 1)  Levothyroxine Sodium 100 Mcg Tabs (Levothyroxine sodium) .... One tablet mon-thurs and 1/2 tab fri, sat, and sun 2)  Adult Aspirin Ec Low Strength 81 Mg Tbec (Aspirin) .... Take 1 tablet by mouth once a day 3)  Potassium 99 Mg Tabs (Potassium) .... Take 1 tablet by mouth once a day 4)  Vision Vitamins Tabs (Multiple vitamins-minerals) .... One tab by mouth qd 5)  Lovastatin 40 Mg Tabs (Lovastatin) .... 2 tablets at bedtime 6)  Oxybutynin Chloride 5 Mg Xr24h-tab (Oxybutynin chloride) .... Take 1 tablet by mouth once a day 7)  Omeprazole 20 Mg Cpdr (Omeprazole) .... One cap by mouth once daily for acid reflux 8)  Metoprolol Tartrate 100 Mg Tabs (Metoprolol tartrate) .... Take 1 tablet by mouth two times a day 9)  Digoxin 0.125 Mg Tabs (Digoxin) .... Take 1 tablet by mouth once a day 10)  Warfarin Sodium 5 Mg Tabs (Warfarin sodium) .... Take 1 tab by mouth at bedtime 11)  Lorazepam 0.5 Mg Tabs (Lorazepam) .... One tablet at bedtime as needed 12)  Hydrochlorothiazide 25 Mg Tabs (Hydrochlorothiazide) .... Take 1/2 tablet by mouth once daily recertification form reviewed and signed

## 2010-09-27 NOTE — Assessment & Plan Note (Signed)
Summary: ROV   Visit Type:  Follow-up Primary Provider:  Dr.Margaret Moshe Cipro  CC:  no cardiology complaints.  History of Present Illness: Mrs. Mcparland returns today for followup.  She has a h/o atrial fib, diastolic CHF, and HTN.  She is on chronic coumadin.  She has occaisional palpitations.  No peripheral edema.  No chest pain. No syncope.  Current Medications (verified): 1)  Potassium 99 Mg Tabs (Potassium) .... Take 1 Tablet By Mouth Once A Day 2)  Lovastatin 40 Mg Tabs (Lovastatin) .... 2 Tablets At Bedtime 3)  Oxybutynin Chloride 5 Mg Xr24h-Tab (Oxybutynin Chloride) .... Take 1 Tablet By Mouth Once A Day 4)  Omeprazole 20 Mg Cpdr (Omeprazole) .... One Cap By Mouth Once Daily For Acid Reflux 5)  Metoprolol Tartrate 100 Mg Tabs (Metoprolol Tartrate) .... Take 1 Tablet By Mouth Two Times A Day 6)  Digoxin 0.125 Mg Tabs (Digoxin) .... Take 1 Tablet By Mouth Once A Day 7)  Warfarin Sodium 5 Mg Tabs (Warfarin Sodium) .... Uad 8)  Lorazepam 0.5 Mg Tabs (Lorazepam) .... One Tablet At Bedtime As Needed 9)  Hydrochlorothiazide 25 Mg Tabs (Hydrochlorothiazide) .... Take 1 Tablet By Mouth Once Daily 10)  Flonase 50 Mcg/act Susp (Fluticasone Propionate) .... 2 Sprays Per Nostril Daily 11)  Levothyroxine Sodium 100 Mcg Tabs (Levothyroxine Sodium) .... One Tablet By Mouth Daily 12)  Vitamin D 1000 Unit Tabs (Cholecalciferol) .... Take 1 Tablet By Mouth Once A Day 13)  Vitamin B-12 1000 Mcg Tabs (Cyanocobalamin) .... Take 1 Tablet By Mouth Once A Day 14)  Vitamin E 1000 Unit Caps (Vitamin E) .... 2 Tabs Once Daily  Allergies (verified): No Known Drug Allergies  Past History:  Past Medical History: Last updated: 01/17/2010 Sick sinus syndrome: Atrial fibrilation 10/2009; and dual chamber pacemaker in 4/11; normal EF Anticoagulation HYPERLIPIDEMIA (ICD-272.4) HYPERTENSION (ICD-401.9) PANCREATITIS-2008 Gastroesophageal reflux disease HEARING LOSS (ICD-389.9) IMPAIRED GLUCOSE TOLERANCE  (ICD-271.3) HYPOTHYROIDISM (ICD-244.9) Macular degeneration Nocturnal oxygen started in 2011  Past Surgical History: Last updated: 01/17/2010 Hysterectomy with right salpingo-oophorectomy1970s Bilateral cataract extraction Dual-chamber pacemaker implanted-2011  Review of Systems  The patient denies chest pain, syncope, dyspnea on exertion, and peripheral edema.    Vital Signs:  Patient profile:   77 year old female Menstrual status:  hysterectomy Weight:      183 pounds BMI:     31.04 Pulse rate:   61 / minute BP sitting:   132 / 74  (right arm)  Vitals Entered By: Doretha Sou, CNA (July 12, 2010 2:59 PM)  Physical Exam  General:  Well developed, well nourished, in no acute distress. Head:  Normocephalic and atraumatic without obvious abnormalities. No apparent alopecia or balding. Eyes:  vision grossly intact.   Mouth:  Oral mucosa and oropharynx without lesions or exudates.  poor dentition and teeth missing.   Neck:  No deformities, masses, or tenderness noted. Chest Wall:  Well healed PPM. Lungs:  Clear bilaterally to auscultation with no wheezes, rales, or rhonchi. Heart:  RRR with normal S1 and S2.  PMI is not enlarged or laterally displaced. Abdomen:  Bowel sounds positive; abdomen soft and non-tender without masses, organomegaly, or hernias noted. No hepatosplenomegaly. Msk:  Back normal, normal gait. Muscle strength and tone normal. Pulses:  pulses normal in all 4 extremities Extremities:  No clubbing or cyanosis. Neurologic:  Alert and oriented x 3.   PPM Specifications Following MD:  Virl Axe, MD     PPM Vendor:  [employer]     PPM  Model Number:  [id number]     PPM Serial Number:  [id number] PPM DOI:  12/17/2009     PPM Implanting MD:  Virl Axe, MD  Lead 1    Location: [facility]     DOI: 12/17/2009     Model #: [id number]     Serial #: [id number]     Status: active Lead 2    Location: [facility]     DOI: 12/17/2009     Model #: [id number]     Serial #: [id number]     Status:  active  Magnet Response Rate:  BOL 100 ERI 85  Indications:  Sick sinus syndrome   PPM Follow Up Remote Check?  No Battery Voltage:  2.95 V     Battery Est. Longevity:  6.8 years     Pacer Dependent:  No       PPM Device Measurements Atrium  Amplitude: 1.7 mV, Impedance: 400 ohms, Threshold: 1.0 V at 0.5 msec Right Ventricle  Amplitude: 6.3 mV, Impedance: 550 ohms, Threshold: 0.875 V at 0.5 msec  Episodes MS Episodes:  2364     Percent Mode Switch:  8.4%     Coumadin:  Yes  Parameters Mode:  DDDR     Lower Rate Limit:  60     Upper Rate Limit:  100 Paced AV Delay:  250     Sensed AV Delay:  250 Next Cardiology Appt Due:  11/27/2010 Tech Comments:  No parameter changes.  8.4% mode switch, + coumadin.  Ventricular rates controlled with A-fib.  ROV 4/12 with Dr. Lovena Le in [place]. Alma Friendly, LPN  November 15, 624THL 3:21 PM  MD Comments:  Agree with above.  Impression & Recommendations:  Problem # 1:  ATRIAL FIBRILLATION (ICD-427.31) She is mostly maintaining NSR.  She does have some palpitations which appear to be self limited. Her updated medication list for this problem includes:    Metoprolol Tartrate 100 Mg Tabs (Metoprolol tartrate) .Marland Kitchen... Take 1 tablet by mouth two times a day    Digoxin 0.125 Mg Tabs (Digoxin) .Marland Kitchen... Take 1 tablet by mouth once a day    Warfarin Sodium 5 Mg Tabs (Warfarin sodium) ..... Uad  Problem # 2:  CARDIAC PACEMAKER IN SITU (ICD-V45.01) Her device is working normally.  Will followup in several months.  Problem # 3:  HYPERTENSION (ICD-401.9) Her blood pressure is under better control with her weight loss. I have asked her to reduce her sodium intake. Her updated medication list for this problem includes:    Metoprolol Tartrate 100 Mg Tabs (Metoprolol tartrate) .Marland Kitchen... Take 1 tablet by mouth two times a day    Hydrochlorothiazide 25 Mg Tabs (Hydrochlorothiazide) .Marland Kitchen... Take 1 tablet by mouth once daily  Patient Instructions: 1)  Your physician  recommends that you schedule a follow-up appointment in: 6 MONTHS

## 2010-09-27 NOTE — Assessment & Plan Note (Signed)
Summary: f up   Vital Signs:  Patient profile:   77 year old female Menstrual status:  hysterectomy Height:      64.5 inches Weight:      190.13 pounds BMI:     32.25 O2 Sat:      97 % Pulse rate:   61 / minute Pulse rhythm:   regular Resp:     16 per minute BP sitting:   140 / 70  (left arm) Cuff size:   large  Vitals Entered By: Kate Sable LPN (July 20, 624THL 624THL AM)  Nutrition Counseling: Patient's BMI is greater than 25 and therefore counseled on weight management options. CC: Follow up chronic problems   Primary Care Provider:  Dr.Emaya Preston Moshe Cipro  CC:  Follow up chronic problems.  History of Present Illness: Reports  that she has been  doing well. Denies recent fever or chills. Denies sinus pressure, nasal congestion , ear pain or sore throat. Denies chest congestion, or cough productive of sputum. Denies chest pain, palpitations, PND, orthopnea or leg swelling. Denies abdominal pain, nausea, vomitting, diarrhea or constipation. Denies change in bowel movements or bloody stool. Denies dysuria , frequency, incontinence or hesitancy. Denies  joint pain, swelling, or reduced mobility. Denies headaches, vertigo, seizures. Denies depression, anxiety or insomnia. Denies  rash, lesions, or itch.     Current Medications (verified): 1)  Levothyroxine Sodium 100 Mcg Tabs (Levothyroxine Sodium) .... One Tablet Mon-Thurs and 1/2 Tab Fri, Sat, and Sun 2)  Potassium 99 Mg Tabs (Potassium) .... Take 1 Tablet By Mouth Once A Day 3)  Lovastatin 40 Mg Tabs (Lovastatin) .... 2 Tablets At Bedtime 4)  Oxybutynin Chloride 5 Mg Xr24h-Tab (Oxybutynin Chloride) .... Take 1 Tablet By Mouth Once A Day 5)  Omeprazole 20 Mg Cpdr (Omeprazole) .... One Cap By Mouth Once Daily For Acid Reflux 6)  Metoprolol Tartrate 100 Mg Tabs (Metoprolol Tartrate) .... Take 1 Tablet By Mouth Two Times A Day 7)  Digoxin 0.125 Mg Tabs (Digoxin) .... Take 1 Tablet By Mouth Once A Day 8)  Warfarin Sodium 5  Mg Tabs (Warfarin Sodium) .... Uad 9)  Lorazepam 0.5 Mg Tabs (Lorazepam) .... One Tablet At Bedtime As Needed 10)  Hydrochlorothiazide 25 Mg Tabs (Hydrochlorothiazide) .... Take 1 Tablet By Mouth Once Daily 11)  Flonase 50 Mcg/act Susp (Fluticasone Propionate) .... 2 Sprays Per Nostril Daily  Allergies (verified): No Known Drug Allergies  Review of Systems      See HPI Eyes:  Denies blurring, discharge, eye pain, and red eye. Endo:  Denies cold intolerance, excessive thirst, excessive urination, and heat intolerance. Heme:  Denies abnormal bruising and bleeding. Allergy:  Denies hives or rash and itching eyes.  Physical Exam  General:  Well-developed,well-nourished,in no acute distress; alert,appropriate and cooperative throughout examination HEENT: No facial asymmetry,  EOMI, No sinus tenderness, TM's Clear, oropharynx  pink and moist.   Chest: Clear to auscultation bilaterally.  CVS: S1, S2, No murmurs, No S3.   Abd: Soft, Nontender.  MS: Adequate ROM spine, hips, shoulders and knees.  Ext: No edema.   CNS: CN 2-12 intact, power tone and sensation normal throughout.   Skin: Intact, no visible lesions or rashes.  Psych: Good eye contact, normal affect.  Memory intact, not anxious or depressed appearing.    Impression & Recommendations:  Problem # 1:  SICK SINUS SYNDROME (ICD-427.81) Assessment Improved  Her updated medication list for this problem includes:    Metoprolol Tartrate 100 Mg Tabs (Metoprolol tartrate) .Marland KitchenMarland KitchenMarland KitchenMarland Kitchen  Take 1 tablet by mouth two times a day    Warfarin Sodium 5 Mg Tabs (Warfarin sodium) ..... Uad pt is s/p pacemaker insertion  Problem # 2:  HYPERTENSION (ICD-401.9) Assessment: Deteriorated  Her updated medication list for this problem includes:    Metoprolol Tartrate 100 Mg Tabs (Metoprolol tartrate) .Marland Kitchen... Take 1 tablet by mouth two times a day    Hydrochlorothiazide 25 Mg Tabs (Hydrochlorothiazide) .Marland Kitchen... Take 1 tablet by mouth once  daily  Orders: T-Basic Metabolic Panel ([id number])  BP today: 140/70 Prior BP: 128/54 (01/17/2010)  Labs Reviewed: K+: 3.8 (03/08/2010) Creat: : 1.02 (03/08/2010)   Chol: 160 (03/08/2010)   HDL: 34 (03/08/2010)   LDL: 62 (03/08/2010)   TG: 318 (03/08/2010)  Problem # 3:  HYPERLIPIDEMIA (ICD-272.4) Assessment: Unchanged  Her updated medication list for this problem includes:    Lovastatin 40 Mg Tabs (Lovastatin) .Marland Kitchen... 2 tablets at bedtime  Orders: T-Lipid Profile [id number]) T-Hepatic Function [id number])  Labs Reviewed: SGOT: 18 (03/08/2010)   SGPT: 14 (03/08/2010)   HDL:34 (03/08/2010), 37 (01/25/2010)  LDL:62 (03/08/2010), 56 (01/25/2010)  Chol:160 (03/08/2010), 155 (01/25/2010)  Trig:318 (03/08/2010), 312 (01/25/2010) low fat diet discussed and encouraged  Problem # 4:  ANXIETY (ICD-300.00) Assessment: Improved  Her updated medication list for this problem includes:    Lorazepam 0.5 Mg Tabs (Lorazepam) ..... One tablet at bedtime as needed  Problem # 5:  HYPOTHYROIDISM (ICD-244.9) Assessment: Deteriorated  The following medications were removed from the medication list:    Levothyroxine Sodium 100 Mcg Tabs (Levothyroxine sodium) ..... One tablet mon-thurs and 1/2 tab fri, sat, and sun Her updated medication list for this problem includes:    Levothyroxine Sodium 100 Mcg Tabs (Levothyroxine sodium) ..... One tablet monday through saturday, and half tablet on sunday  Orders: T-TSH [id number])  Labs Reviewed: TSH: 6.391 (03/08/2010)    HgBA1c: 5.9 (03/14/2010) Chol: 160 (03/08/2010)   HDL: 34 (03/08/2010)   LDL: 62 (03/08/2010)   TG: 318 (03/08/2010)  Complete Medication List: 1)  Potassium 99 Mg Tabs (Potassium) .... Take 1 tablet by mouth once a day 2)  Lovastatin 40 Mg Tabs (Lovastatin) .... 2 tablets at bedtime 3)  Oxybutynin Chloride 5 Mg Xr24h-tab (Oxybutynin chloride) .... Take 1 tablet by mouth once a day 4)  Omeprazole 20 Mg Cpdr (Omeprazole) ....  One cap by mouth once daily for acid reflux 5)  Metoprolol Tartrate 100 Mg Tabs (Metoprolol tartrate) .... Take 1 tablet by mouth two times a day 6)  Digoxin 0.125 Mg Tabs (Digoxin) .... Take 1 tablet by mouth once a day 7)  Warfarin Sodium 5 Mg Tabs (Warfarin sodium) .... Uad 8)  Lorazepam 0.5 Mg Tabs (Lorazepam) .... One tablet at bedtime as needed 9)  Hydrochlorothiazide 25 Mg Tabs (Hydrochlorothiazide) .... Take 1 tablet by mouth once daily 10)  Flonase 50 Mcg/act Susp (Fluticasone propionate) .... 2 sprays per nostril daily 11)  Levothyroxine Sodium 100 Mcg Tabs (Levothyroxine sodium) .... One tablet monday through saturday, and half tablet on sunday  Other Orders: T- Hemoglobin A1C [id number])  Patient Instructions: 1)  Please schedule a follow-up appointment in 3 months. 2)  It is important that you exercise regularly at least 20 minutes 5 times a week. If you develop chest pain, have severe difficulty breathing, or feel very tired , stop exercising immediately and seek medical attention. 3)  You need to lose weight. Consider a lower calorie diet and regular exercise.  4)  tSH in 8 weeks 5)  hBAI fasting lipids  in 3 months 6)  new dose of thyroid med as on sheet, oNE daily,monday through Saturday, Half on sunday Prescriptions: LEVOTHYROXINE SODIUM 100 MCG TABS (LEVOTHYROXINE SODIUM) one tablet Monday through saturday, and half tablet on Sunday  #30 x 4   Entered and Authorized by:     MD   Signed by:     MD on 03/16/2010   Method used:   Printed then faxed to ...       [facility]* (retail)       [address]       [place], [place]  [address]       Ph: [phone]       Fax: [phone]   RxID:   [id number]

## 2010-09-27 NOTE — Assessment & Plan Note (Signed)
Summary: office visit   Vital Signs:  Patient profile:   77 year old female Menstrual status:  hysterectomy Height:      64.5 inches Weight:      185.50 pounds BMI:     31.46 O2 Sat:      94 % Pulse rate:   73 / minute Pulse rhythm:   regular Resp:     16 per minute BP sitting:   120 / 68  (left arm) Cuff size:   large  Vitals Entered By: Kate Sable LPN (October 13, 624THL 9:56 AM)  Nutrition Counseling: Patient's BMI is greater than 25 and therefore counseled on weight management options. CC: Follow up chronic problems   Primary Care Provider:  Dr.Margaret Moshe Cipro  CC:  Follow up chronic problems.  History of Present Illness: pt reports that she has had her heart racing more often than before she had an adjustment of the pacemaker done approx.  2 to 3 months ago  No lightheadenedness , but she has been experiences the palpitations up to twice in a bad week , not more than once weekly .she  denies chest pain,PND or orthopnea. she denies any recent fever or chills, but states that her family members are all geting over respiratory symptoms so she has concerns about whether she should take the flu vaccine today. Reports  that they are doing well. Denies recent fever or chills. Denies sinus pressure, nasal congestion , ear pain or sore throat. Denies chest congestion, or cough productive of sputum. Denies chest pain, palpitations, PND, orthopnea or leg swelling. Denies abdominal pain, nausea, vomitting, diarrhea or constipation. Denies change in bowel movements or bloody stool. Denies dysuria , frequency, incontinence or hesitancy. Denies  joint pain, swelling, or reduced mobility. Denies headaches, vertigo, seizures. Denies depression, anxiety or insomnia. Denies  rash, lesions, or itch.     Allergies (verified): No Known Drug Allergies  Review of Systems      See HPI General:  Complains of fatigue. Eyes:  Denies discharge, eye pain, and red eye. Endo:  Denies cold  intolerance, excessive thirst, excessive urination, and polyuria. Heme:  Denies abnormal bruising and bleeding. Allergy:  Complains of seasonal allergies; denies hives or rash and itching eyes.  Physical Exam  General:  Well-developed,well-nourished,in no acute distress; alert,appropriate and cooperative throughout examination HEENT: No facial asymmetry,  EOMI, No sinus tenderness, TM's Clear, oropharynx  pink and moist.   Chest: Clear to auscultation bilaterally.  CVS: S1, S2, No murmurs, No S3.   Abd: Soft, Nontender.  MS: Adequate ROM spine, hips, shoulders and knees.  Ext: No edema.   CNS: CN 2-12 intact, power tone and sensation normal throughout.   Skin: Intact, no visible lesions or rashes.  Psych: Good eye contact, normal affect.  Memory intact, not anxious or depressed appearing.    Impression & Recommendations:  Problem # 1:  CARDIAC PACEMAKER IN SITU (ICD-V45.01) Assessment Comment Only pt to have card f/u earlier if possible based on reported symptom of tachycardia  Problem # 2:  HYPERLIPIDEMIA (ICD-272.4) Assessment: Improved  Her updated medication list for this problem includes:    Lovastatin 40 Mg Tabs (Lovastatin) .Marland Kitchen... 2 tablets at bedtime Low fat dietdiscussed and encouraged  Orders: T-Hepatic Function [id number]) T-Lipid Profile [phone])  Labs Reviewed: SGOT: 16 (06/02/2010)   SGPT: 12 (06/02/2010)   HDL:36 (06/02/2010), 34 (03/08/2010)  LDL:54 (06/02/2010), 62 (03/08/2010)  Chol:136 (06/02/2010), 160 (03/08/2010)  Trig:228 (06/02/2010), 318 (03/08/2010)  Problem # 3:  HYPERTENSION (ICD-401.9) Assessment:  Improved  Her updated medication list for this problem includes:    Metoprolol Tartrate 100 Mg Tabs (Metoprolol tartrate) .Marland Kitchen... Take 1 tablet by mouth two times a day    Hydrochlorothiazide 25 Mg Tabs (Hydrochlorothiazide) .Marland Kitchen... Take 1 tablet by mouth once daily  Orders: T-Basic Metabolic Panel ([id number])  BP today: 120/68 Prior BP:  140/66 (03/30/2010)  Labs Reviewed: K+: 4.2 (06/02/2010) Creat: : 1.01 (06/02/2010)   Chol: 136 (06/02/2010)   HDL: 36 (06/02/2010)   LDL: 54 (06/02/2010)   TG: 228 (06/02/2010)  Problem # 4:  HYPOTHYROIDISM (ICD-244.9) Assessment: Comment Only  Her updated medication list for this problem includes:    Levothyroxine Sodium 100 Mcg Tabs (Levothyroxine sodium) ..... One tablet monday through saturday, and half tablet on sunday  Orders: T-TSH [id number]) T-TSH [id number])  Labs Reviewed: TSH: 5.338 (06/03/2010)   med adjustment made HgBA1c: 5.9 (06/02/2010) Chol: 136 (06/02/2010)   HDL: 36 (06/02/2010)   LDL: 54 (06/02/2010)   TG: 228 (06/02/2010)  Complete Medication List: 1)  Potassium 99 Mg Tabs (Potassium) .... Take 1 tablet by mouth once a day 2)  Lovastatin 40 Mg Tabs (Lovastatin) .... 2 tablets at bedtime 3)  Oxybutynin Chloride 5 Mg Xr24h-tab (Oxybutynin chloride) .... Take 1 tablet by mouth once a day 4)  Omeprazole 20 Mg Cpdr (Omeprazole) .... One cap by mouth once daily for acid reflux 5)  Metoprolol Tartrate 100 Mg Tabs (Metoprolol tartrate) .... Take 1 tablet by mouth two times a day 6)  Digoxin 0.125 Mg Tabs (Digoxin) .... Take 1 tablet by mouth once a day 7)  Warfarin Sodium 5 Mg Tabs (Warfarin sodium) .... Uad 8)  Lorazepam 0.5 Mg Tabs (Lorazepam) .... One tablet at bedtime as needed 9)  Hydrochlorothiazide 25 Mg Tabs (Hydrochlorothiazide) .... Take 1 tablet by mouth once daily 10)  Flonase 50 Mcg/act Susp (Fluticasone propionate) .... 2 sprays per nostril daily 11)  Levothyroxine Sodium 100 Mcg Tabs (Levothyroxine sodium) .... One tablet monday through saturday, and half tablet on sunday 12)  Vitamin D 1000 Unit Tabs (Cholecalciferol) .... Take 1 tablet by mouth once a day 13)  Vitamin B-12 1000 Mcg Tabs (Cyanocobalamin) .... Take 1 tablet by mouth once a day 14)  Vitamin E 1000 Unit Caps (Vitamin e) .... 2 tabs once daily  Other Orders: Influenza Vaccine MCR  [id number]) Radiology Referral (Radiology) Cardiology Referral (Cardiology)  Patient Instructions: 1)  Please schedule a follow-up appointment in 4 months. 2)  Pls cut back on cheees and continue to keep sweets and carbs down, you are not diabetic, but could become diabetic if you do not conttinue to do this. 3)  I will try to get an earlier appt with Dr Lovena Le. 4)  TSH prior to visit, ICD-9:   non fasting.2 months 5)  Pls continue the new dose,of thyroid med as discussed. 6)  BMP prior to visit, ICD-9: 7)  Hepatic Panel prior to visit, ICD-9: 8)  Lipid Panel prior to visit, ICD-9:  fasrting in 4 months 9)  TSH prior to visit, ICD-9: 10)  Flu vac today. 11)  Congrats on weight loss pls keep it up 12)  The medication list was reviewed and reconciled..All changed/newly prescribed medications were explained. A complete medication list was provided to the patient/caregiver.    Influenza Vaccine    Vaccine Type: Fluvax MCR    Site: right deltoid    Mfr: novartis    Dose: 0.5 ml    Route: IM    Given by: Velna Hatchet  Hudy LPN    Exp. Date: 12/2010    Lot #: [id number]

## 2010-09-27 NOTE — Medication Information (Signed)
Summary: ccr-lr  Anticoagulant Therapy  Managed by: Edrick Oh, RN PCP: Dr.Margaret Sandria Senter MD: Lattie Haw MD, Herbie [place] Indication 1: Atrial Fibrillation Lab Used: [facility]  Site: [place] INR POC 3.7  Dietary changes: no    Health status changes: no    Bleeding/hemorrhagic complications: no    Recent/future hospitalizations: no    Any changes in medication regimen? no    Recent/future dental: no  Any missed doses?: no       Is patient compliant with meds? yes       Allergies: No Known Drug Allergies  Anticoagulation Management History:      The patient is taking warfarin and comes in today for a routine follow up visit.  Positive risk factors for bleeding include an age of 28 years or older.  Negative risk factors for bleeding include no history of CVA/TIA, no history of GI bleeding, and absence of serious comorbidities.  The bleeding index is 'intermediate risk'.  Positive CHADS2 values include History of HTN and Age > 75 years old.  Negative CHADS2 values include History of CHF, History of Diabetes, and Prior Stroke/CVA/TIA.  Anticoagulation responsible provider: Lattie Haw MD, Herbie [place].  INR POC: 3.7.    Anticoagulation Management Assessment/Plan:      The patient's current anticoagulation dose is Warfarin sodium 5 mg tabs: uad.  The target INR is 2.0-3.0.  The next INR is due 02/21/2010.  Anticoagulation instructions were given to patient.  Results were reviewed/authorized by Edrick Oh, RN.  She was notified by Edrick Oh RN.         Prior Anticoagulation Instructions: INR 4.5 Hold coumadin tonight then decrease dose to 2.5mg  once daily   Current Anticoagulation Instructions: INR 3.7 Decrease coumadin to 2.5mg  once daily except none on Wednesdays

## 2010-09-27 NOTE — Medication Information (Signed)
Summary: ccr-lr  Anticoagulant Therapy  Managed by: Edrick Oh, RN PCP: Dr.Margaret Sandria Senter MD: Domenic Polite MD, Mikeal Hawthorne Indication 1: Atrial Fibrillation Lab Used: [facility] [place] Site: [place] INR POC 1.9  Dietary changes: no    Health status changes: no    Bleeding/hemorrhagic complications: no    Recent/future hospitalizations: yes       Details: [facility] and [facility] 12/15/09 - 12/18/09 for pacemaker implant  Any changes in medication regimen? yes       Details: resumed on couamdin   has finished keflex  Recent/future dental: no  Any missed doses?: no       Is patient compliant with meds? yes       Allergies: No Known Drug Allergies  Anticoagulation Management History:      The patient is taking warfarin and comes in today for a routine follow up visit.  Positive risk factors for bleeding include an age of 75 years or older.  Negative risk factors for bleeding include no history of CVA/TIA, no history of GI bleeding, and absence of serious comorbidities.  The bleeding index is 'intermediate risk'.  Positive CHADS2 values include History of HTN and Age > 75 years old.  Negative CHADS2 values include History of CHF, History of Diabetes, and Prior Stroke/CVA/TIA.  Anticoagulation responsible provider: Domenic Polite MD, Mikeal Hawthorne.  INR POC: 1.9.  Cuvette Lot#: [id number].    Anticoagulation Management Assessment/Plan:      The patient's current anticoagulation dose is Warfarin sodium 5 mg tabs: uad.  The target INR is 2.0-3.0.  The next INR is due 12/27/2009.  Anticoagulation instructions were given to patient.  Results were reviewed/authorized by Edrick Oh, RN.  She was notified by Edrick Oh RN.         Prior Anticoagulation Instructions: INR 4.5 Hold coumadin tonight then decrease dose to 5mg  once daily except 2.5mg  on Tuesdays, Thursdays and Saturdays  Current Anticoagulation Instructions: INR 1.9 S/P pacer implant Continue coumadin 5mg  once daily except 2.5mg  on  Tuesdays, Thursdays and Saturdays

## 2010-09-27 NOTE — Medication Information (Signed)
Summary: ccr-lr  Anticoagulant Therapy  Managed by: Edrick Oh, RN PCP: Dr.Margaret Sandria Senter MD: Domenic Polite MD, Mikeal Hawthorne Indication 1: Atrial Fibrillation Lab Used: [facility] [place] Site: [place] INR POC 1.9  Dietary changes: no    Health status changes: no    Bleeding/hemorrhagic complications: no    Recent/future hospitalizations: no    Any changes in medication regimen? no    Recent/future dental: no  Any missed doses?: no       Is patient compliant with meds? yes       Allergies: No Known Drug Allergies  Anticoagulation Management History:      The patient is taking warfarin and comes in today for a routine follow up visit.  Positive risk factors for bleeding include an age of 45 years or older.  Negative risk factors for bleeding include no history of CVA/TIA, no history of GI bleeding, and absence of serious comorbidities.  The bleeding index is 'intermediate risk'.  Positive CHADS2 values include History of HTN and Age > 45 years old.  Negative CHADS2 values include History of CHF, History of Diabetes, and Prior Stroke/CVA/TIA.  Anticoagulation responsible provider: Domenic Polite MD, Mikeal Hawthorne.  INR POC: 1.9.  Cuvette Lot#: [id number].    Anticoagulation Management Assessment/Plan:      The patient's current anticoagulation dose is Warfarin sodium 5 mg tabs: uad.  The target INR is 2.0-3.0.  The next INR is due 08/04/2010.  Anticoagulation instructions were given to patient.  Results were reviewed/authorized by Edrick Oh, RN.  She was notified by Edrick Oh RN.         Prior Anticoagulation Instructions: INR 2.7 Continue coumadin 2.5mg  once daily   Current Anticoagulation Instructions: INR 1.9 Take coumadin 1 tablet tonight then resume 1/2 tablet once daily

## 2010-09-27 NOTE — Progress Notes (Signed)
Summary: call  Phone Note Call from Patient   Summary of Call: Juliann Pulse roland her daughter wants you to call her at [phone] Initial call taken by: Dierdre Harness,  December 22, 2009 10:41 AM  Follow-up for Phone Call        daughter wants to know does patient need more antibiotics for sinus infection, had one dose in hospital  clear mucous, no fever Follow-up by: Baldomero Lamy LPN,  April 27, 624THL 1:17 PM  Additional Follow-up for Phone Call Additional follow up Details #1::        based on d/c summary no Additional Follow-up by: Tula Nakayama MD,  December 22, 2009 4:40 PM    Additional Follow-up for Phone Call Additional follow up Details #2::    returned call, left message Follow-up by: Baldomero Lamy LPN,  April 27, 624THL 4:59 PM  Additional Follow-up for Phone Call Additional follow up Details #3:: Details for Additional Follow-up Action Taken: returned call, left message Additional Follow-up by: Baldomero Lamy LPN,  April 28, 624THL 10:26 AM

## 2010-09-27 NOTE — Progress Notes (Signed)
  Phone Note Other Incoming   Caller: DAUGHTER Summary of Call: Daughter called on 12/18/2009 stating Mom had been nauseated, no vomitting or diarreah, mild abdominal pain x 2 days. Ne fever , chills or body aches, no one else sick.  i advsied push fluids ,and bRAT diet, if worsened, call for phenergan nd goto ED if no response or deterioration. I also spoke directly with the pt Initial call taken by: Tula Nakayama MD,  December 20, 2009 10:31 PM

## 2010-09-27 NOTE — Medication Information (Signed)
Summary: ccr-lr  Anticoagulant Therapy  Managed by: Edrick Oh, RN PCP: Dr.Margaret Sandria Senter MD: Lattie Haw MD, Herbie Baltimore Indication 1: Atrial Fibrillation Lab Used: [facility] [place] Site: [place] INR POC 4.5  Dietary changes: no    Health status changes: yes       Details: UTI  Bleeding/hemorrhagic complications: no    Recent/future hospitalizations: no    Any changes in medication regimen? yes       Details: Was on septra bid for UTI.  Finished last week  Recent/future dental: no  Any missed doses?: no       Is patient compliant with meds? yes       Allergies: No Known Drug Allergies  Anticoagulation Management History:      The patient is taking warfarin and comes in today for a routine follow up visit.  Positive risk factors for bleeding include an age of 39 years or older.  Negative risk factors for bleeding include no history of CVA/TIA, no history of GI bleeding, and absence of serious comorbidities.  The bleeding index is 'intermediate risk'.  Positive CHADS2 values include History of HTN and Age > 66 years old.  Negative CHADS2 values include History of CHF, History of Diabetes, and Prior Stroke/CVA/TIA.  Anticoagulation responsible provider: Lattie Haw MD, Herbie [place].  INR POC: 4.5.  Cuvette Lot#: [id number].    Anticoagulation Management Assessment/Plan:      The patient's current anticoagulation dose is Warfarin sodium 5 mg tabs: uad.  The target INR is 2.0-3.0.  The next INR is due 02/02/2010.  Anticoagulation instructions were given to patient.  Results were reviewed/authorized by Edrick Oh, RN.  She was notified by Edrick Oh RN.         Prior Anticoagulation Instructions: INR 2.6 Decrease coumadin to 2.5mg  once daily except 5mg  on Sundays  Current Anticoagulation Instructions: INR 4.5 Hold coumadin tonight then decrease dose to 2.5mg  once daily

## 2010-09-27 NOTE — Assessment & Plan Note (Signed)
Summary: office visit   Vital Signs:  Patient profile:   77 year old female Menstrual status:  hysterectomy Height:      64.5 inches Weight:      188 pounds BMI:     31.89 O2 Sat:      98 % Pulse rate:   95 / minute Pulse rhythm:   irregular Resp:     16 per minute BP sitting:   130 / 70  (left arm) Cuff size:   large  Vitals Entered By: Kate Sable LPN (April  1, 624THL [id number] AM) CC: having some shortness of breath, thinks she still has the bronchitis   CC:  having some shortness of breath and thinks she still has the bronchitis.  History of Present Illness: 1 week h/o dyspnea withexertion also paslpitations. Pt reports that her chest congestion and cough have resolved. she denies current fever or chills. She denies PND , orthopnea, chronically uses 2 pillows, or leg swelling or chest pain. she has recently had inc anxiety being locked in the house unable to open from the inside, reportedly fixed today. She is extrwemely claustrophobic. Of note pt recently had a dose reduction in her lthyroxine because of overcorrection Statesd the cough and chest congestion are beter. She has recently had some anxiety since she was unable to open her front door from theinside this may nowbe resolved Current Medications (verified): 1)  Hydrochlorothiazide 25 Mg  Tabs (Hydrochlorothiazide) .... One Tab By Mouth Once Daily 2)  Levothyroxine Sodium 100 Mcg Tabs (Levothyroxine Sodium) .... One Tablet Mon-Thurs and 1/2 Tab Fri, Sat, and Sun 3)  Adult Aspirin Ec Low Strength 81 Mg Tbec (Aspirin) .... Take 1 Tablet By Mouth Once A Day 4)  Potassium 99 Mg Tabs (Potassium) .... Take 1 Tablet By Mouth Once A Day 5)  Vision Vitamins  Tabs (Multiple Vitamins-Minerals) .... One Tab By Mouth Qd 6)  Amlodipine Besylate 2.5 Mg Tabs (Amlodipine Besylate) .... One Tab By Mouth Qd 7)  Lovastatin 40 Mg Tabs (Lovastatin) .... 2 Tablets At Bedtime 8)  Oxybutynin Chloride 5 Mg Xr24h-Tab (Oxybutynin Chloride) .... Take 1  Tablet By Mouth Once A Day 9)  Omeprazole 20 Mg Cpdr (Omeprazole) .... One Cap By Mouth Once Daily For Acid Reflux  Allergies (verified): No Known Drug Allergies  Review of Systems      See HPI Eyes:  Denies blurring and discharge. ENT:  Complains of nasal congestion; denies sinus pressure. CV:  See HPI. Resp:  Denies cough and sputum productive. GI:  Denies abdominal pain, constipation, diarrhea, nausea, and vomiting. GU:  Denies dysuria and urinary frequency. MS:  Denies joint pain. Psych:  Complains of anxiety; denies depression.  Physical Exam  General:  Well-developed,well-nourished,; alert,appropriate and cooperative throughout examinationpt tachpneic and tachcardic HEENT: No facial asymmetry,  EOMI, No sinus tenderness, TM's Clear, oropharynx  pink and moist.   Chest: Clear to auscultation bilaterally.  CVS: S1, S2, No murmurs, No S3. Irregular heart rate  Abd: Soft, Nontender.  MS: Adequate ROM spine, hips, shoulders and knees.  Ext: No edema.   CNS: CN 2-12 intact, power tone and sensation normal throughout.   Skin: Intact, no visible lesions or rashes.  Psych: Good eye contact, normal affect.  Memory intact,  anxious  appearing.    Impression & Recommendations:  Problem # 1:  PALPITATIONS (ICD-785.1) Assessment Comment Only  Orders: EKG w/ Interpretation (93000)atrail fib vent response 135, pt taken to the Ed  Problem # 2:  ACUTE BRONCHITIS (  ICD-466.0) Assessment: Improved  The following medications were removed from the medication list:    Sulfamethoxazole-trimethoprim 400-80 Mg Tabs (Sulfamethoxazole-trimethoprim) .Marland Kitchen... Take 1 two times a day x 10 days  Problem # 3:  HYPERTENSION (ICD-401.9) Assessment: Unchanged  Her updated medication list for this problem includes:    Hydrochlorothiazide 25 Mg Tabs (Hydrochlorothiazide) ..... One tab by mouth once daily    Amlodipine Besylate 2.5 Mg Tabs (Amlodipine besylate) ..... One tab by mouth qd  BP today:  130/70 Prior BP: 126/60 (11/18/2009)  Labs Reviewed: K+: 4.0 (11/11/2009) Creat: : 0.86 (11/11/2009)   Chol: 122 (11/11/2009)   HDL: 42 (11/11/2009)   LDL: 42 (11/11/2009)   TG: 191 (11/11/2009)  Problem # 4:  HYPOTHYROIDISM (ICD-244.9) Assessment: Comment Only  Her updated medication list for this problem includes:    Levothyroxine Sodium 100 Mcg Tabs (Levothyroxine sodium) ..... One tablet mon-thurs and 1/2 tab fri, sat, and sun  Labs Reviewed: TSH: 0.037 (11/11/2009)   overcorrected, recent dose reduction HgBA1c: 5.6 (06/10/2009) Chol: 122 (11/11/2009)   HDL: 42 (11/11/2009)   LDL: 42 (11/11/2009)   TG: 191 (11/11/2009)  Complete Medication List: 1)  Hydrochlorothiazide 25 Mg Tabs (Hydrochlorothiazide) .... One tab by mouth once daily 2)  Levothyroxine Sodium 100 Mcg Tabs (Levothyroxine sodium) .... One tablet mon-thurs and 1/2 tab fri, sat, and sun 3)  Adult Aspirin Ec Low Strength 81 Mg Tbec (Aspirin) .... Take 1 tablet by mouth once a day 4)  Potassium 99 Mg Tabs (Potassium) .... Take 1 tablet by mouth once a day 5)  Vision Vitamins Tabs (Multiple vitamins-minerals) .... One tab by mouth qd 6)  Amlodipine Besylate 2.5 Mg Tabs (Amlodipine besylate) .... One tab by mouth qd 7)  Lovastatin 40 Mg Tabs (Lovastatin) .... 2 tablets at bedtime 8)  Oxybutynin Chloride 5 Mg Xr24h-tab (Oxybutynin chloride) .... Take 1 tablet by mouth once a day 9)  Omeprazole 20 Mg Cpdr (Omeprazole) .... One cap by mouth once daily for acid reflux  Patient Instructions: 1)  F/u in 10 days . 2)  you are being taken tio the ED for further treatment

## 2010-09-27 NOTE — Letter (Signed)
Summary: [facility], general practice at [place]. [address], [place] [address]   Phone: [phone]  Fax: [phone]      January 27, 2010 MRN: [id number]   [name] [id number] [place], [place]  [address]   Dear [name],  Your test ordered by Rande Lawman has been reviewed by your physician (or physician assistant) and was found to be normal or stable. Your physician (or physician assistant) felt no changes were needed at this time.  ____ Echocardiogram  ____ Cardiac Stress Test  __x__ Lab Work  ____ Peripheral vascular study of arms, legs or neck  ____ CT scan or X-ray  ____ Lung or Breathing test  ____ Other:  No change in medical treatment at this time, per Dr. Lattie Haw.  Enclosed is a copy of your labwork for your records.  Thank you, Tiffanie Blassingame B[facility] RN    Jacqulyn Ducking, MD, Leana Gamer.C.Renella Cunas, MD, F.A.C.C Cristopher Peru, MD, F.A.C.C Rozann Lesches, MD, F.A.C.C Jenkins Rouge, MD, Leana Gamer.C.C

## 2010-09-27 NOTE — Assessment & Plan Note (Signed)
Summary: POST [facility] [place] PER LETTER FAXED FROM HOSP/TG   Visit Type:  Follow-up Primary Provider:  Dr.Margaret Moshe Cipro  CC:  no cardiology complaints.  History of Present Illness: [name] with known history of Atrial fib, hypertension, hypothyroidism.  She has had her pacemaker checked and her PT-INR checked while she is here as well.  The pacemaker interrogation showed frequent episode of high ventricular response of 130-140bpm.  She is without complaints of chest pain, SOB, dizziness and palpatations.  She has gained some weight while decreasing the dose of HCTZ to 12.5mg  from 25mg  daily. She has had some LE edema associated and requests to go back to 25mg  daily.  Her energy level is good and she is otherwise feeling well.    Current Medications (verified): 1)  Levothyroxine Sodium 100 Mcg Tabs (Levothyroxine Sodium) .... One Tablet Mon-Thurs and 1/2 Tab Fri, Sat, and Sun 2)  Potassium 99 Mg Tabs (Potassium) .... Take 1 Tablet By Mouth Once A Day 3)  Lovastatin 40 Mg Tabs (Lovastatin) .... 2 Tablets At Bedtime 4)  Oxybutynin Chloride 5 Mg Xr24h-Tab (Oxybutynin Chloride) .... Take 1 Tablet By Mouth Once A Day 5)  Omeprazole 20 Mg Cpdr (Omeprazole) .... One Cap By Mouth Once Daily For Acid Reflux 6)  Metoprolol Tartrate 100 Mg Tabs (Metoprolol Tartrate) .... Take 1 Tablet By Mouth Two Times A Day 7)  Digoxin 0.125 Mg Tabs (Digoxin) .... Take 1 Tablet By Mouth Once A Day 8)  Warfarin Sodium 5 Mg Tabs (Warfarin Sodium) .... Uad 9)  Lorazepam 0.5 Mg Tabs (Lorazepam) .... One Tablet At Bedtime As Needed 10)  Hydrochlorothiazide 25 Mg Tabs (Hydrochlorothiazide) .... Take 1 Tablet By Mouth Once Daily 11)  Flonase 50 Mcg/act Susp (Fluticasone Propionate) .... 2 Sprays Per Nostril Daily  Allergies (verified): No Known Drug Allergies  Review of Systems       Mild Fluid retention  All other systems have been reviewed and are negative unless stated above. \par Vital Signs:  Patient profile:    77 year old female Menstrual status:  hysterectomy Weight:      191 pounds Pulse rate:   61 / minute BP sitting:   129 / 56  (right arm)  Vitals Entered By: Doretha Sou, CNA (Jan 03, 2010 12:40 PM)  Physical Exam  General:  Well developed, well nourished, in no acute distress. Lungs:  Clear bilaterally to auscultation and percussion. Heart:  Distant, irregular without MRG. Abdomen:  Bowel sounds positive; abdomen soft and non-tender without masses, organomegaly, or hernias noted. No hepatosplenomegaly. Msk:  Back normal, normal gait. Muscle strength and tone normal. Extremities:  Mild nonpitting edema in LE.trace left pedal edema and trace right pedal edema.   Neurologic:  Alert and oriented x 3. Psych:  Normal affect.   EKG  Procedure date:  01/03/2010  Findings:       Atrium and ventricle are paced.    PPM Specifications Following MD:  Virl Axe, MD     PPM Vendor:  [employer]     PPM Model Number:  [id number]     PPM Serial Number:  [id number] PPM DOI:  12/17/2009     PPM Implanting MD:  Virl Axe, MD  Lead 1    Location: [facility]     DOI: 12/17/2009     Model #: [id number]     Serial #: [id number]     Status: active Lead 2    Location: [facility]     DOI: 12/17/2009  Model #: [id number]     Serial #: [id number]     Status: active  Magnet Response Rate:  BOL 100 ERI 85  Indications:  Sick sinus syndrome   Impression & Recommendations:  Problem # 1:  BRADYCARDIA (ICD-427.89) Review of pacemaker interrogation reveals high ventricular response.,  I will check with Dr. Lovena Le about need to adjust medications, or change them with this new information. After talking with Dr. Lovena Le and his review of histogram, he has advised to make no changes in her medications at this time for HR control as she is asymptomatic.Marland Kitchen  He will see her in one month. Her updated medication list for this problem includes:    Metoprolol Tartrate 100 Mg Tabs (Metoprolol tartrate) .Marland Kitchen... Take 1 tablet by mouth two times a  day    Warfarin Sodium 5 Mg Tabs (Warfarin sodium) ..... Uad  Problem # 2:  PERIPHERAL EDEMA (ICD-782.3) I have allowed for increase in HCTZ.  She is reminded to take her potassium tablets along with this medicatin and to report any muscle cramps or palpatations.  Follow-up BMET per Dr. Moshe Cipro visit.  Patient Instructions: 1)  Your physician recommends that you schedule a follow-up appointment in: 6 months 2)  Your physician recommends that you continue on your current medications as directed. Please refer to the Current Medication list given to you today. 3)  You have been referred to Dr. Lovena Le, another [facility] Cardiologist at this office on Aug. 3. Prescriptions: METOPROLOL TARTRATE 100 MG TABS (METOPROLOL TARTRATE) Take 1 tablet by mouth two times a day  #60 x 6   Entered by:   Jeani Hawking Via LPN   Authorized by:   Jory Sims, NP   Signed by:   Jeani Hawking Via LPN on 075-GRM   Method used:   Electronically to        [facility] (retail)       [place] [name] [place], [place]  [address]       Ph: [phone]       Fax: [phone]   RxID:   [id number] DIGOXIN 0.125 MG TABS (DIGOXIN) Take 1 tablet by mouth once a day  #30 x 6   Entered by:   Jeani Hawking Via LPN   Authorized by:   Jory Sims, NP   Signed by:   Jeani Hawking Via LPN on 075-GRM   Method used:   Electronically to        [facility] (retail)       [place] [address] [place], [place]  [address]       Ph: [phone]       Fax: [phone]   RxID:   [id number] HYDROCHLOROTHIAZIDE 25 MG TABS (HYDROCHLOROTHIAZIDE) take 1 tablet by mouth once daily  #30 x 6   Entered by:   Jeani Hawking Via LPN   Authorized by:   Jory Sims, NP   Signed by:   Jeani Hawking Via LPN on 075-GRM   Method used:   Electronically to        [facility] (retail)       [place] [address] [place], [place]  [address]       Ph:  [phone]       Fax: [phone]   RxID:  [id number] OXYBUTYNIN CHLORIDE 5 MG XR24H-TAB (OXYBUTYNIN CHLORIDE) Take 1 tablet by mouth once a day  #30 x 6   Entered by:   Jeani Hawking Via LPN   Authorized by:   Jory Sims, NP   Signed by:   Jeani Hawking Via LPN on 075-GRM   Method used:   Electronically to        [facility] (retail)       [place] [address] [place], [place]  [address]       Ph: [phone]       Fax: [phone]   RxID:   [id number]

## 2010-09-27 NOTE — Assessment & Plan Note (Signed)
Summary: F UP   Vital Signs:  Patient profile:   77 year old female Menstrual status:  hysterectomy Height:      64.5 inches Weight:      189 pounds BMI:     32.06 O2 Sat:      96 % Pulse rate:   51 / minute Pulse rhythm:   regular Resp:     16 per minute BP sitting:   124 / 66  (left arm) Cuff size:   large  Vitals Entered By: Kate Sable LPN (April  8, 624THL [id number] AM)  Nutrition Counseling: Patient's BMI is greater than 25 and therefore counseled on weight management options. CC: Hospital follow up, came home wednesday night   CC:  Hospital follow up and came home wednesday night.  History of Present Illness: Pt comes in with her 2 daughters for hospital f/u for atrial fibrilation with rapid ventricular response, first presentation. She feels better, though still not back to baseliune. At the time of her admission, her Landrum had returned to normal, she had had a recent dose decreased which apparently was effective. She is home with oxygen at the request of the family, however, both at rest and with activity, her oxygen has remained over 90%/she is to have oxygen tested at home thois weekend while asleep, to see if there is significaNT DESATURATION GOING ON. a sleep study is to b e arranged , also a pulmonary consult to evaluate chronic cough as wellas possibly justify the need for supplemental oxygen. Her apetite and energy level is fair.   Current Medications (verified): 1)  Levothyroxine Sodium 100 Mcg Tabs (Levothyroxine Sodium) .... One Tablet Mon-Thurs and 1/2 Tab Fri, Sat, and Sun 2)  Adult Aspirin Ec Low Strength 81 Mg Tbec (Aspirin) .... Take 1 Tablet By Mouth Once A Day 3)  Potassium 99 Mg Tabs (Potassium) .... Take 1 Tablet By Mouth Once A Day 4)  Vision Vitamins  Tabs (Multiple Vitamins-Minerals) .... One Tab By Mouth Qd 5)  Lovastatin 40 Mg Tabs (Lovastatin) .... 2 Tablets At Bedtime 6)  Oxybutynin Chloride 5 Mg Xr24h-Tab (Oxybutynin Chloride) .... Take 1 Tablet By  Mouth Once A Day 7)  Omeprazole 20 Mg Cpdr (Omeprazole) .... One Cap By Mouth Once Daily For Acid Reflux 8)  Metoprolol Tartrate 100 Mg Tabs (Metoprolol Tartrate) .... Take 1 Tablet By Mouth Two Times A Day 9)  Digoxin 0.125 Mg Tabs (Digoxin) .... Take 1 Tablet By Mouth Once A Day 10)  Lorazepam 0.5 Mg Tabs (Lorazepam) .... One Tab Q 8 Hrs As Needed 11)  Warfarin Sodium 5 Mg Tabs (Warfarin Sodium) .... Take 1 Tab By Mouth At Bedtime  Allergies (verified): No Known Drug Allergies  Past History:  Past Medical History: PANCREATITIS, HX OF (ICD-V12.70) HEARING LOSS (ICD-389.9) IMPAIRED GLUCOSE TOLERANCE (ICD-271.3) HYPOTHYROIDISM (ICD-244.9) HYPERLIPIDEMIA (ICD-272.4) HYPERTENSION (ICD-401.9) Atrial fibrilation 10/2009  Review of Systems      See HPI General:  Complains of fatigue, sleep disorder, and weakness; denies chills and fever; improving fatigue and weakness, uses medication to address anxiety associated with sleep disturbance. Eyes:  Denies blurring and discharge. ENT:  Complains of nasal congestion; denies hoarseness, sinus pressure, and sore throat. CV:  Complains of shortness of breath with exertion; denies chest pain or discomfort, difficulty breathing while lying down, palpitations, and swelling of feet; recently dx with a fib with rapid vent response, now rate is well controlled. Resp:  Complains of cough and shortness of breath; denies sputum productive and wheezing;  chronic non productive cough. GI:  Denies abdominal pain, constipation, diarrhea, nausea, and vomiting. GU:  Denies dysuria and urinary frequency. MS:  Complains of joint pain and stiffness. Derm:  Complains of lesion(s); denies itching and rash; bruises where pt recently had lovenox Graham. Neuro:  Denies headaches, seizures, and sensation of room spinning. Psych:  Complains of anxiety and panic attacks; denies depression, suicidal thoughts/plans, thoughts of violence, and unusual visions or sounds;  claustrophobic. Endo:  Denies cold intolerance, excessive hunger, excessive thirst, excessive urination, heat intolerance, polyuria, and weight change. Heme:  Denies abnormal bruising and bleeding. Allergy:  Complains of seasonal allergies; denies hives or rash and persistent infections; chronic nasal congestion and post nasal drainage.  Physical Exam  General:  Well-developed,well-nourished,in no acute distress; alert,appropriate and cooperative throughout examination HEENT: No facial asymmetry,  EOMI, No sinus tenderness, TM's Clear, oropharynx  pink and moist. erythema and edema of nasal mucosa  Chest: Clear to auscultation bilaterally. decreased air entry throughout, no wheezes or crackles CVS: S1, S2, No murmurs, No S3.   Abd: Soft, Nontender.  MS: Adequate ROM spine, hips, shoulders and knees.  Ext: No edema.   CNS: CN 2-12 intact, power tone and sensation normal throughout.   Skin: Intact, bruising and purpura Psych: Good eye contact, normal affect.  Memory intact, not anxious or depressed appearing.    Impression & Recommendations:  Problem # 1:  SLEEP APNEA (ICD-780.57) Assessment Comment Only  Orders: Sleep Disorder Referral (Sleep Disorder), pt observed while recently hospitaqlise to have symptoms and signs of sleep apnea, recommendation for sleep study at time of d/ c same to be arranged  Problem # 2:  COUGH (ICD-786.2) Assessment: Unchanged  Orders: Pulmonary Referral (Pulmonary) c/o chronic coough, no wheezing heard, pt does have chronic allergic rhinitis, formal pulmonary eval to evaluate for asthma, as well as the continued need for oxygen   Problem # 3:  HYPOTHYROIDISM (ICD-244.9) Assessment: Improved  Her updated medication list for this problem includes:    Levothyroxine Sodium 100 Mcg Tabs (Levothyroxine sodium) ..... One tablet mon-thurs and 1/2 tab fri, sat, and sun  Labs Reviewed: TSH: 0.037 (11/11/2009)    HgBA1c: 5.6 (06/10/2009) Chol: 122  (11/11/2009)   HDL: 42 (11/11/2009)   LDL: 42 (11/11/2009)   TG: 191 (11/11/2009)  Problem # 4:  ATRIAL FIBRILLATION (ICD-427.31) Assessment: Improved  The following medications were removed from the medication list:    Amlodipine Besylate 2.5 Mg Tabs (Amlodipine besylate) ..... One tab by mouth qd Her updated medication list for this problem includes:    Adult Aspirin Ec Low Strength 81 Mg Tbec (Aspirin) .Marland Kitchen... Take 1 tablet by mouth once a day    Metoprolol Tartrate 100 Mg Tabs (Metoprolol tartrate) .Marland Kitchen... Take 1 tablet by mouth two times a day    Digoxin 0.125 Mg Tabs (Digoxin) .Marland Kitchen... Take 1 tablet by mouth once a day    Warfarin Sodium 5 Mg Tabs (Warfarin sodium) .Marland Kitchen... Take 1 tab by mouth at bedtime currently rate is controlled with med changes  Problem # 5:  HYPERLIPIDEMIA (ICD-272.4) Assessment: Improved  Her updated medication list for this problem includes:    Lovastatin 40 Mg Tabs (Lovastatin) .Marland Kitchen... 2 tablets at bedtime  Labs Reviewed: SGOT: 14 (11/11/2009)   SGPT: 14 (11/11/2009)   HDL:42 (11/11/2009), 53 (06/03/2009)  LDL:42 (11/11/2009), 80 (06/03/2009)  Chol:122 (11/11/2009), 170 (06/03/2009)  Trig:191 (11/11/2009), 187 (06/03/2009)  Problem # 6:  ANXIETY (ICD-300.00) Assessment: Unchanged  The following medications were removed from the medication  list:    Lorazepam 0.5 Mg Tabs (Lorazepam) ..... One tab q 8 hrs as needed Her updated medication list for this problem includes:    Lorazepam 0.5 Mg Tabs (Lorazepam) ..... One tablet at bedtime as needed  Problem # 7:  ALLERGIC RHINITIS CAUSE UNSPECIFIED (ICD-477.9) Assessment: Deteriorated  Her updated medication list for this problem includes:    Flonase 50 Mcg/act Susp (Fluticasone propionate) ..... One puff per nostril twice daily, as needed  Complete Medication List: 1)  Levothyroxine Sodium 100 Mcg Tabs (Levothyroxine sodium) .... One tablet mon-thurs and 1/2 tab fri, sat, and sun 2)  Adult Aspirin Ec Low Strength  81 Mg Tbec (Aspirin) .... Take 1 tablet by mouth once a day 3)  Potassium 99 Mg Tabs (Potassium) .... Take 1 tablet by mouth once a day 4)  Vision Vitamins Tabs (Multiple vitamins-minerals) .... One tab by mouth qd 5)  Lovastatin 40 Mg Tabs (Lovastatin) .... 2 tablets at bedtime 6)  Oxybutynin Chloride 5 Mg Xr24h-tab (Oxybutynin chloride) .... Take 1 tablet by mouth once a day 7)  Omeprazole 20 Mg Cpdr (Omeprazole) .... One cap by mouth once daily for acid reflux 8)  Metoprolol Tartrate 100 Mg Tabs (Metoprolol tartrate) .... Take 1 tablet by mouth two times a day 9)  Digoxin 0.125 Mg Tabs (Digoxin) .... Take 1 tablet by mouth once a day 10)  Warfarin Sodium 5 Mg Tabs (Warfarin sodium) .... Take 1 tab by mouth at bedtime 11)  Lorazepam 0.5 Mg Tabs (Lorazepam) .... One tablet at bedtime as needed 12)  Flonase 50 Mcg/act Susp (Fluticasone propionate) .... One puff per nostril twice daily, as needed  Patient Instructions: 1)  F/u in 5 weeks 2)  I am happy that you are doing better. 3)  You will be referred to a lung specialist to asses your chronic cough and breathing. 4)  i am sending in a new med for allergies, pls use daily. 5)  you will be referred for a sleep study Prescriptions: OMEPRAZOLE 20 MG CPDR (OMEPRAZOLE) one cap by mouth once daily for acid reflux  #30 x 4   Entered by:   Kate Sable LPN   Authorized by:   Tula Nakayama MD   Signed by:   Kate Sable LPN on 624THL   Method used:   Electronically to        [facility] (retail)       [place] [address] [place], [place]  [address]       Ph: [phone]       Fax: [phone]   [id number] OXYBUTYNIN CHLORIDE 5 MG XR24H-TAB (OXYBUTYNIN CHLORIDE) Take 1 tablet by mouth once a day  #30 x 4   Entered by:   Kate Sable LPN   Authorized by:   Tula Nakayama MD   Signed by:   Kate Sable LPN on 624THL   Method used:   Electronically to        [facility]  (retail)       [place] [address]       [place], [place]  [address]       Ph: [phone]       Fax: [phone]   [id number] LOVASTATIN 40 MG TABS (LOVASTATIN) 2 tablets at bedtime  #60 x 4   Entered by:   Kate Sable LPN  Authorized by:   Tula Nakayama MD   Signed by:   Kate Sable LPN on 624THL   Method used:   Electronically to        [facility] (retail)       [place] [address]       [place], [place]  [address]       Ph: [phone]       Fax: [phone]   RxID:   [id number] FLONASE 50 MCG/ACT SUSP (FLUTICASONE PROPIONATE) one puff per nostril twice daily, as needed  #1 x 3   Entered and Authorized by:   Tula Nakayama MD   Signed by:   Tula Nakayama MD on 12/03/2009   Method used:   Handwritten   [id number] LORAZEPAM 0.5 MG TABS (LORAZEPAM) one tablet at bedtime as needed  #30 x 3   Entered and Authorized by:   Tula Nakayama MD   Signed by:   Tula Nakayama MD on 12/03/2009   Method used:   Printed then faxed to ...       [facility] (retail)       [place] [address]       [place], [place]  [address]       Ph: [phone]       Fax: [phone]   RxID:   [id number]

## 2010-09-27 NOTE — Medication Information (Signed)
Summary: ccr-lr  Anticoagulant Therapy  Managed by: Edrick Oh, RN PCP: Dr.Margaret Sandria Senter MD: Lattie Haw MD, Herbie Baltimore Indication 1: Atrial Fibrillation Lab Used: [facility] [place] Site: [place] INR POC 2.7  Dietary changes: no    Health status changes: no    Bleeding/hemorrhagic complications: no    Recent/future hospitalizations: no    Any changes in medication regimen? yes       Details: levothyroxine increased to 1 pill  Recent/future dental: no  Any missed doses?: no       Is patient compliant with meds? yes       Allergies: No Known Drug Allergies  Anticoagulation Management History:      The patient is taking warfarin and comes in today for a routine follow up visit.  Positive risk factors for bleeding include an age of 75 years or older.  Negative risk factors for bleeding include no history of CVA/TIA, no history of GI bleeding, and absence of serious comorbidities.  The bleeding index is 'intermediate risk'.  Positive CHADS2 values include History of HTN and Age > 73 years old.  Negative CHADS2 values include History of CHF, History of Diabetes, and Prior Stroke/CVA/TIA.  Anticoagulation responsible Leita Lindbloom: Lattie Haw MD, Herbie Baltimore.  INR POC: 2.7.  Cuvette Lot#: [id number].    Anticoagulation Management Assessment/Plan:      The patient's current anticoagulation dose is Warfarin sodium 5 mg tabs: uad.  The target INR is 2.0-3.0.  The next INR is due 07/07/2010.  Anticoagulation instructions were given to patient.  Results were reviewed/authorized by Edrick Oh, RN.  She was notified by Edrick Oh RN.         Prior Anticoagulation Instructions: INR 2.3 Continue coumadin 2.5mg  once daily   Current Anticoagulation Instructions: INR 2.7 Continue coumadin 2.5mg  once daily

## 2010-09-27 NOTE — Progress Notes (Signed)
Summary: mammogram appt.  Phone Note Outgoing Call   Summary of Call: I tried calling patient to inform her of her appt. with [facility] on Oct. 27,11 at 10:50, she is to return my call. Initial call taken by: Eliezer Mccoy,  June 21, 2010 9:02 AM  Follow-up for Phone Call        daughter aware Follow-up by: Tula Nakayama MD,  June 22, 2010 10:48 AM

## 2010-09-27 NOTE — Assessment & Plan Note (Signed)
Summary: 1 wk f/u per checkout on 06/14/10/tg   Visit Type:  Follow-up Primary Provider:  Dr.Margaret Moshe Cipro  CC:  some sob.  History of Present Illness: [name] is a very pleasant 36 CF with known history of SSS s/p DDDR, atrial fibrillation on coumadin, hypothyroidism who presents back to the office today after having pacemaker evaluated.  She was sent to [facility] after complaints of increased palpations after recent pacemaker interrogation and changes in parameters in August of 2011.  The pacemaker was interrogated on 06-14-2010 and results demonstrated 3247 AMS episodes; Normal device fx.  Lower rate limit 60; upper rate limit 100.  Paced AV delay 250, Sensed AV delay 250.  I have called Dr. Lovena Le who is DOD today at [facility] to discuss these result and request guidence concerning medication changes. He wishes to see the patient on next available office visit and plans to initiate antiarrythmic therapy.  This has been discussed with the pateint who verbalizes understanding. She is to follow with Dr. Lovena Le for more of his recommendations.  No assessment of PE was completed.  Current Medications (verified): 1)  Potassium 99 Mg Tabs (Potassium) .... Take 1 Tablet By Mouth Once A Day 2)  Lovastatin 40 Mg Tabs (Lovastatin) .... 2 Tablets At Bedtime 3)  Oxybutynin Chloride 5 Mg Xr24h-Tab (Oxybutynin Chloride) .... Take 1 Tablet By Mouth Once A Day 4)  Omeprazole 20 Mg Cpdr (Omeprazole) .... One Cap By Mouth Once Daily For Acid Reflux 5)  Metoprolol Tartrate 100 Mg Tabs (Metoprolol Tartrate) .... Take 1 Tablet By Mouth Two Times A Day 6)  Digoxin 0.125 Mg Tabs (Digoxin) .... Take 1 Tablet By Mouth Once A Day 7)  Warfarin Sodium 5 Mg Tabs (Warfarin Sodium) .... Uad 8)  Lorazepam 0.5 Mg Tabs (Lorazepam) .... One Tablet At Bedtime As Needed 9)  Hydrochlorothiazide 25 Mg Tabs (Hydrochlorothiazide) .... Take 1 Tablet By Mouth Once Daily 10)  Flonase 50 Mcg/act Susp (Fluticasone Propionate) .... 2  Sprays Per Nostril Daily 11)  Levothyroxine Sodium 100 Mcg Tabs (Levothyroxine Sodium) .... One Tablet By Mouth Daily 12)  Vitamin D 1000 Unit Tabs (Cholecalciferol) .... Take 1 Tablet By Mouth Once A Day 13)  Vitamin B-12 1000 Mcg Tabs (Cyanocobalamin) .... Take 1 Tablet By Mouth Once A Day 14)  Vitamin E 1000 Unit Caps (Vitamin E) .... 2 Tabs Once Daily  Allergies (verified): No Known Drug Allergies  Comments:  Nurse/Medical Assistant: patient reviewed previous med list and stated all meds are the same   Vital Signs:  Patient profile:   77 year old female Menstrual status:  hysterectomy Weight:      191 pounds BMI:     32.40 Pulse rate:   56 / minute BP sitting:   120 / 59  (right arm)  Vitals Entered By: Doretha Sou, CNA (June 23, 2010 1:39 PM)   PPM Specifications Following MD:  Virl Axe, MD     PPM Vendor:  [employer]     PPM Model Number:  [id number]     PPM Serial Number:  [id number] PPM DOI:  12/17/2009     PPM Implanting MD:  Virl Axe, MD  Lead 1    Location: RA     DOI: 12/17/2009     Model #: [id number]     Serial #: [id number]     Status: active Lead 2    Location: RV     DOI: 12/17/2009     Model #: [id number]  Serial #: [id number]     Status: active  Magnet Response Rate:  BOL 100 ERI 85  Indications:  Sick sinus syndrome   PPM Follow Up Pacer Dependent:  No      Episodes Coumadin:  Yes  Parameters Mode:  DDDR     Lower Rate Limit:  60     Upper Rate Limit:  100 Paced AV Delay:  250     Sensed AV Delay:  250  Patient Instructions: 1)  Your physician recommends that you schedule a follow-up appointment in:  2)  CAP information  3)  aging disability and transit services [phone]

## 2010-09-29 ENCOUNTER — Encounter: Payer: Self-pay | Admitting: Cardiology

## 2010-09-29 ENCOUNTER — Ambulatory Visit: Admit: 2010-09-29 | Payer: Self-pay

## 2010-09-29 ENCOUNTER — Encounter (INDEPENDENT_AMBULATORY_CARE_PROVIDER_SITE_OTHER): Payer: Medicare Other

## 2010-09-29 DIAGNOSIS — I4891 Unspecified atrial fibrillation: Secondary | ICD-10-CM

## 2010-09-29 DIAGNOSIS — Z7901 Long term (current) use of anticoagulants: Secondary | ICD-10-CM

## 2010-09-29 NOTE — Miscellaneous (Signed)
Summary: BMP, Lipid, LFT  Clinical Lists Changes  Observations: Added new observation of ALBUMIN: 4.2 g/dL (06/02/2010 10:44) Added new observation of PROTEIN, TOT: 7.0 g/dL (06/02/2010 10:44) Added new observation of SGPT (ALT): 12 units/L (06/02/2010 10:44) Added new observation of SGOT (AST): 16 units/L (06/02/2010 10:44) Added new observation of ALK PHOS: 80 units/L (06/02/2010 10:44) Added new observation of BILI DIRECT: 0.1 mg/dL (06/02/2010 10:44) Added new observation of CREATININE: 1.01 mg/dL (06/02/2010 10:44) Added new observation of BUN: 20 mg/dL (06/02/2010 10:44) Added new observation of BG RANDOM: 114 mg/dL (06/02/2010 10:44) Added new observation of CO2 PLSM/SER: 27 meq/L (06/02/2010 10:44) Added new observation of CL SERUM: 103 meq/L (06/02/2010 10:44) Added new observation of K SERUM: 4.2 meq/L (06/02/2010 10:44) Added new observation of NA: 141 meq/L (06/02/2010 10:44) Added new observation of LDL: 54 mg/dL (06/02/2010 10:44) Added new observation of HDL: 36 mg/dL (06/02/2010 10:44) Added new observation of TRIGLYC TOT: 228 mg/dL (06/02/2010 10:44) Added new observation of CHOLESTEROL: 136 mg/dL (06/02/2010 10:44) Added new observation of HGBA1C: 5.9 % (06/02/2010 10:44)

## 2010-09-29 NOTE — Miscellaneous (Signed)
Summary: LABS TSH 08/04/2010  Clinical Lists Changes  Observations: Added new observation of TSH: 4.746 microintl units/mL (08/04/2010 16:14)

## 2010-09-29 NOTE — Progress Notes (Signed)
Summary: cold  Phone Note Call from Patient   Summary of Call: pt has a cold and would like to know what she can take for it. [id number] Initial call taken by: Lenn Cal,  September 16, 2010 10:11 AM  Follow-up for Phone Call        stuffy head, congestion, no fever chills or body aches, clear nasal drainage patient been taking coriseden hp and tylenol x 1 week Follow-up by: Baldomero Lamy LPN,  January 20, [id number] 10:22 AM  Additional Follow-up for Phone Call Additional follow up Details #1::        pls let her know a tessalon perles have been sent in, she shoould also use clarin or zyrtec  one daily. PLs notify pharmacy on telephone and fax order not to fill the prednisone I sent  pls 9inc bleeding risk on coumadin Additional Follow-up by: Tula Nakayama MD,  September 16, 2010 12:47 PM    Additional Follow-up for Phone Call Additional follow up Details #2::    Patient and daughter aware  Pharmacist notified by phone to d/c the prednisone  Follow-up by: Kate Sable LPN,  January 20, [id number] 1:41 PM  New/Updated Medications: TESSALON PERLES 100 MG CAPS (BENZONATATE) Take 1 capsule by mouth three times a day PREDNISONE (PAK) 5 MG TABS (PREDNISONE) Use as directed Prescriptions: PREDNISONE (PAK) 5 MG TABS (PREDNISONE) Use as directed  #21 x 0   Entered and Authorized by:   Tula Nakayama MD   Signed by:   Tula Nakayama MD on 09/16/2010   Method used:   Electronically to        [facility] (retail)       [place] [address]       [place], [place]  [address]       Ph: [phone]       Fax: [phone]   RxID:   [id number] TESSALON PERLES 100 MG CAPS (BENZONATATE) Take 1 capsule by mouth three times a day  #30 x 0   Entered and Authorized by:   Tula Nakayama MD   Signed by:   Tula Nakayama MD on 09/16/2010   Method used:   Electronically to        [facility] (retail)       [place]  [place], [place]  [address]       Ph: [phone]       Fax: [phone]   RxID:   [id number]

## 2010-09-29 NOTE — Progress Notes (Signed)
Summary: oxybutynin chloride 5mg   Phone Note Call from Patient   Summary of Call: patient states she has called [facility] and asked them to fax a refill on her oxybutynin chloride .  Please advsie. Initial call taken by: Eliezer Mccoy,  August 19, 2010 10:09 AM    Prescriptions: OXYBUTYNIN CHLORIDE 5 MG XR24H-TAB (OXYBUTYNIN CHLORIDE) Take 1 tablet by mouth once a day  #30 x 1   Entered by:   Baldomero Lamy LPN   Authorized by:   Tula Nakayama MD   Signed by:   Baldomero Lamy LPN on [id number]   Method used:   Electronically to        [facility] (retail)       [place] [address]       [place], [place]  [address]       Ph: [phone]       Fax: [phone]   RxID:   [id number]

## 2010-09-29 NOTE — Progress Notes (Signed)
Summary: PT NEED COUMADIN REFILLED TODAY SHE IS OUT/TMJ  Phone Note Call from Patient Call back at Home Phone [phone]   Caller: pt Reason for Call: Talk to Nurse Summary of Call: pt needs coumadin called in today she is out Initial call taken by: Nevada Crane,  August 19, 2010 10:05 AM  Follow-up for Phone Call        Tried to do E-script yesterday and unable to send due to MD note on hold.  Will try again today.  LMOM for pt.  Follow-up by: Porfirio Oar PharmD,  August 19, 2010 10:52 AM    Prescriptions: WARFARIN SODIUM 5 MG TABS (WARFARIN SODIUM) uad  #30 x 3   Entered by:   Porfirio Oar PharmD   Authorized by:   Bosie Clos, MD, [facility]   Signed by:   Porfirio Oar PharmD on 08/19/2010   Method used:   Electronically to        [facility] (retail)       [place] [address]       [place], [place]  [address]       Ph: [phone]       Fax: [phone]   RxID:   [id number]

## 2010-09-29 NOTE — Medication Information (Signed)
Summary: ccr-lr  Anticoagulant Therapy  Managed by: Edrick Oh, RN PCP: Dr.Margaret Sandria Senter MD: Lattie Haw MD, Herbie [place] Indication 1: Atrial Fibrillation Lab Used: [facility] [place] Site: [place] INR POC 2.1  Dietary changes: no    Health status changes: no    Bleeding/hemorrhagic complications: no    Recent/future hospitalizations: no    Any changes in medication regimen? no    Recent/future dental: no  Any missed doses?: no       Is patient compliant with meds? yes       Allergies: No Known Drug Allergies  Anticoagulation Management History:      The patient is taking warfarin and comes in today for a routine follow up visit.  Positive risk factors for bleeding include an age of 75 years or older.  Negative risk factors for bleeding include no history of CVA/TIA, no history of GI bleeding, and absence of serious comorbidities.  The bleeding index is 'intermediate risk'.  Positive CHADS2 values include History of HTN and Age > 75 years old.  Negative CHADS2 values include History of CHF, History of Diabetes, and Prior Stroke/CVA/TIA.  Anticoagulation responsible provider: Lattie Haw MD, Herbie Baltimore.  INR POC: 2.1.  Cuvette Lot#: [id number].    Anticoagulation Management Assessment/Plan:      The patient's current anticoagulation dose is Warfarin sodium 5 mg tabs: uad.  The target INR is 2.0-3.0.  The next INR is due 09/29/2010.  Anticoagulation instructions were given to patient.  Results were reviewed/authorized by Edrick Oh, RN.  She was notified by Edrick Oh RN.         Prior Anticoagulation Instructions: INR 1.9 Increase coumadin to 2.5mg  once daily except 5mg  on Thursdays  Current Anticoagulation Instructions: INR 2.1 Continue coumadin 2.5mg  once daily except 5mg  on Thursdays

## 2010-09-29 NOTE — Assessment & Plan Note (Signed)
Summary: ROV 7 MONTHS   Visit Type:  Follow-up Primary Provider:  Dr.Margaret Moshe Cipro   History of Present Illness: [name] returns to the office for continued assessment and treatment of sick sinus syndrome, hyperlipidemia and hypertension.  Since her last visit, she developed palpitations and was evaluated by Dr. Lovena Le.  She had multiple mode switches, but predominantly sinus rhythm.  With adjustment of her pacemaker parameters, she is once again asymptomatic.  She walks her dog daily without any cardiopulmonary symptoms.  She denies lightheadedness, orthopnea, PND or syncope.  Anticoagulation has been stable and therapeutic without adverse reactions.  A recent TSH was normal at 4.7.  Current Medications (verified): 1)  Potassium 99 Mg Tabs (Potassium) .... Take 1 Tablet By Mouth Once A Day 2)  Lovastatin 40 Mg Tabs (Lovastatin) .... 2 Tablets At Bedtime 3)  Oxybutynin Chloride 5 Mg Xr24h-Tab (Oxybutynin Chloride) .... Take 1 Tablet By Mouth Once A Day 4)  Omeprazole 20 Mg Cpdr (Omeprazole) .... One Cap By Mouth Once Daily For Acid Reflux 5)  Metoprolol Tartrate 100 Mg Tabs (Metoprolol Tartrate) .... Take 1 Tablet By Mouth Two Times A Day 6)  Digoxin 0.125 Mg Tabs (Digoxin) .... Take 1 Tablet By Mouth Once A Day 7)  Warfarin Sodium 5 Mg Tabs (Warfarin Sodium) .... Uad 8)  Lorazepam 0.5 Mg Tabs (Lorazepam) .... One Tablet At Bedtime As Needed 9)  Hydrochlorothiazide 25 Mg Tabs (Hydrochlorothiazide) .... Take 1 Tablet By Mouth Once Daily 10)  Vitamin D 1000 Unit Tabs (Cholecalciferol) .... Take 1 Tablet By Mouth Once A Day 11)  Vitamin B-12 1000 Mcg Tabs (Cyanocobalamin) .... Take 1 Tablet By Mouth Once A Day 12)  Vitamin E 1000 Unit Caps (Vitamin E) .... 2 Tabs Once Daily 13)  Levothyroxine Sodium 100 Mcg Tabs (Levothyroxine Sodium) .... One and Half Tablets On Saturday and Sunday, One Tablet Monday Through Friday  Allergies (verified): No Known Drug  Allergies  Comments:  Nurse/Medical Assistant: patient brought med bottles and the only change is her levothyroxine 100 micrograms 1 tab mon thru sat  1 1/2 on sundays  Past History:  PMH, FH, and Social History reviewed and updated.  Review of Systems       See history of present illness.  Vital Signs:  Patient profile:   77 year old female Menstrual status:  hysterectomy Weight:      184 pounds O2 Sat:      94  % on Room air Pulse rate:   78 / minute BP sitting:   145 / 75  (right arm)  Vitals Entered By: Doretha Sou, CNA (August 16, 2010 1:10 PM)  O2 Flow:  Room air  Physical Exam  General:  Somewhat overweight; well developed; no acute distress:    Neck-No JVD, no carotid bruits: Lungs-No tachypnea, no rales; no rhonchi;no wheezes:  Cardiovascular-normal PMI; normal S1 and S2: Abdomen-BS normal; soft and non-tender without masses or organomegaly:  Skin-Warm, no significant lesions: Extremities-Nl distal pulses; no edema:    PPM Specifications Following MD:  Virl Axe, MD     PPM Vendor:  [employer]     PPM Model Number:  [id number]     PPM Serial Number:  [id number] PPM DOI:  12/17/2009     PPM Implanting MD:  Virl Axe, MD  Lead 1    Location: [facility]     DOI: 12/17/2009     Model #: [id number]     Serial #: [id number]     Status: active  Lead 2    Location: [facility]     DOI: 12/17/2009     Model #: [id number]     Serial #: [id number]     Status: active  Magnet Response Rate:  BOL 100 ERI 85  Indications:  Sick sinus syndrome   PPM Follow Up Pacer Dependent:  No      Episodes Coumadin:  Yes  Parameters Mode:  DDDR     Lower Rate Limit:  60     Upper Rate Limit:  100 Paced AV Delay:  250     Sensed AV Delay:  250  Impression & Recommendations:  Problem # 1:  PALPITATIONS (P7382067.1) Resolved for the present.  She has had no worrisome arrhythmias, and mild symptoms related to paroxysmal atrial fibrillation do not represent a significant problem.  Problem # 2:   ANTICOAGULATION (ICD-V58.61) Recent CBC was normal.  Stool for Hemoccult testing will be obtained to exclude occult GI blood loss.  Problem # 3:  HYPERLIPIDEMIA (B2193296.4) Recent lipid profile shows adequate control with good total cholesterol and LDL; triglycerides remain somewhat elevated, but does not constitute an additional target for pharmacologic therapy.  Problem # 4:  HYPERTENSION (ICD-401.9) Blood pressure is adequately controlled with current regimen.  I will plan to reassess his nice woman again in 9 months.  She will be seen by Dr. Lovena Le for pacemaker reassessment in the interim.  Patient Instructions: 1)  Your physician recommends that you schedule a follow-up appointment in: 9 months 2)  Your physician recommends that you continue on your current medications as directed. Please refer to the Current Medication list given to you today. 3)  Your physician has asked that you test your stool for blood. It is necessary to test 3 different stool specimens for accuracy. You will be given 3 hemoccult cards for specimen collection. For each stool specimen, place a small portion of stool sample (from 2 different areas of the stool) into the 2 squares on the card. Close card. Repeat with 2 more stool specimens. Bring the cards back to the office for testing.

## 2010-10-05 NOTE — Medication Information (Signed)
Summary: ccr-lr [place]  Anticoagulant Therapy  Managed by: Edrick Oh, RN PCP: Dr.Margaret Sandria Senter MD: Lattie Haw MD, Herbie [place] Indication 1: Atrial Fibrillation Lab Used: [facility] [place] Site: [place] INR POC 2.0  Dietary changes: no    Health status changes: no    Bleeding/hemorrhagic complications: no    Recent/future hospitalizations: no    Any changes in medication regimen? no    Recent/future dental: no  Any missed doses?: no       Is patient compliant with meds? yes       Allergies: No Known Drug Allergies  Anticoagulation Management History:      The patient is taking warfarin and comes in today for a routine follow up visit.  Positive risk factors for bleeding include an age of 75 years or older.  Negative risk factors for bleeding include no history of CVA/TIA, no history of GI bleeding, and absence of serious comorbidities.  The bleeding index is 'intermediate risk'.  Positive CHADS2 values include History of HTN and Age > 75 years old.  Negative CHADS2 values include History of CHF, History of Diabetes, and Prior Stroke/CVA/TIA.  Anticoagulation responsible provider: Lattie Haw MD, Herbie [place].  INR POC: 2.0.  Cuvette Lot#: [id number].    Anticoagulation Management Assessment/Plan:      The patient's current anticoagulation dose is Warfarin sodium 5 mg tabs: uad.  The target INR is 2.0-3.0.  The next INR is due 10/26/2010.  Anticoagulation instructions were given to patient.  Results were reviewed/authorized by Edrick Oh, RN.  She was notified by Edrick Oh RN.         Prior Anticoagulation Instructions: INR 2.1 Continue coumadin 2.5mg  once daily except 5mg  on Thursdays  Current Anticoagulation Instructions: INR 2.0 Continue coumadin 2.5mg  once daily except 5mg  on Thursdays

## 2010-10-13 ENCOUNTER — Ambulatory Visit: Payer: Medicare Other | Admitting: Family Medicine

## 2010-10-13 ENCOUNTER — Encounter: Payer: Self-pay | Admitting: Family Medicine

## 2010-10-14 DIAGNOSIS — R7301 Impaired fasting glucose: Secondary | ICD-10-CM | POA: Insufficient documentation

## 2010-10-14 LAB — CONVERTED CEMR LAB
AST: 16 units/L (ref 0–37)
Albumin: 4.4 g/dL (ref 3.5–5.2)
Alkaline Phosphatase: 80 units/L (ref 39–117)
Calcium: 9.8 mg/dL (ref 8.4–10.5)
Chloride: 102 meq/L (ref 96–112)
Creatinine, Ser: 0.91 mg/dL (ref 0.40–1.20)
HDL: 32 mg/dL — ABNORMAL LOW (ref >39)
LDL Cholesterol: 57 mg/dL (ref 0–99)
Potassium: 4 meq/L (ref 3.5–5.3)
Total CHOL/HDL Ratio: 4
Total Protein: 7.3 g/dL (ref 6.0–8.3)
Triglycerides: 196 mg/dL — ABNORMAL HIGH (ref <150)

## 2010-10-17 ENCOUNTER — Ambulatory Visit: Payer: Medicare Other | Admitting: Family Medicine

## 2010-10-18 ENCOUNTER — Ambulatory Visit (INDEPENDENT_AMBULATORY_CARE_PROVIDER_SITE_OTHER): Payer: Medicare Other | Admitting: Family Medicine

## 2010-10-18 ENCOUNTER — Encounter: Payer: Self-pay | Admitting: Family Medicine

## 2010-10-18 DIAGNOSIS — R5381 Other malaise: Secondary | ICD-10-CM | POA: Insufficient documentation

## 2010-10-18 DIAGNOSIS — R5383 Other fatigue: Secondary | ICD-10-CM | POA: Insufficient documentation

## 2010-10-18 DIAGNOSIS — E039 Hypothyroidism, unspecified: Secondary | ICD-10-CM

## 2010-10-18 DIAGNOSIS — K219 Gastro-esophageal reflux disease without esophagitis: Secondary | ICD-10-CM

## 2010-10-18 DIAGNOSIS — E785 Hyperlipidemia, unspecified: Secondary | ICD-10-CM

## 2010-10-18 DIAGNOSIS — R32 Unspecified urinary incontinence: Secondary | ICD-10-CM

## 2010-10-18 DIAGNOSIS — I10 Essential (primary) hypertension: Secondary | ICD-10-CM

## 2010-10-19 NOTE — Letter (Signed)
Summary: 1st no show   1st no show   Imported By: Dierdre Harness 10/13/2010 15:23:23  _____________________________________________________________________  External Attachment:    Type:   Image     Comment:   External Document

## 2010-10-22 DIAGNOSIS — R32 Unspecified urinary incontinence: Secondary | ICD-10-CM | POA: Insufficient documentation

## 2010-10-25 NOTE — Assessment & Plan Note (Signed)
Summary: office  visit   Vital Signs:  Patient profile:   77 year old female Menstrual status:  hysterectomy Height:      64.5 inches Weight:      183 pounds BMI:     31.04 O2 Sat:      97 % Pulse rate:   58 / minute Pulse rhythm:   regular Resp:     16 per minute BP sitting:   132 / 80  (left arm) Cuff size:   large  Vitals Entered By: Kate Sable LPN (February 21, [id number] 11:01 AM)  Nutrition Counseling: Patient's BMI is greater than 25 and therefore counseled on weight management options. CC: Follow up chronic problems   Primary Care Provider:  Dr.Myrka Sylva Moshe Cipro  CC:  Follow up chronic problems.  History of Present Illness: Reports  that she feels well, she has  a birthday this week, and is very excited about this. Denies recent fever or chills. Denies sinus pressure, nasal congestion , ear pain or sore throat. Denies chest congestion, or cough productive of sputum. Denies chest pain, palpitations, PND, orthopnea or leg swelling. Denies abdominal pain, nausea, vomitting, diarrhea or constipation. Denies change in bowel movements or bloody stool. Denies dysuria , frequency, or hesitancy. Denies  joint pain, swelling, or reduced mobility. Denies headaches, vertigo, seizures. Denies depression, anxiety or insomnia. Denies  rash, lesions, or itch. She is also here to review recent labs.     Current Medications (verified): 1)  Potassium 99 Mg Tabs (Potassium) .... Take 1 Tablet By Mouth Once A Day 2)  Lovastatin 40 Mg Tabs (Lovastatin) .... 2 Tablets At Bedtime 3)  Oxybutynin Chloride 5 Mg Xr24h-Tab (Oxybutynin Chloride) .... Take 1 Tablet By Mouth Once A Day 4)  Omeprazole 20 Mg Cpdr (Omeprazole) .... One Cap By Mouth Once Daily For Acid Reflux 5)  Metoprolol Tartrate 100 Mg Tabs (Metoprolol Tartrate) .... Take 1 Tablet By Mouth Two Times A Day 6)  Digoxin 0.125 Mg Tabs (Digoxin) .... Take 1 Tablet By Mouth Once A Day 7)  Warfarin Sodium 5 Mg Tabs (Warfarin Sodium)  .... Uad 8)  Lorazepam 0.5 Mg Tabs (Lorazepam) .... One Tablet At Bedtime As Needed 9)  Hydrochlorothiazide 25 Mg Tabs (Hydrochlorothiazide) .... Take 1 Tablet By Mouth Once Daily 10)  Vitamin D 1000 Unit Tabs (Cholecalciferol) .... Take 1 Tablet By Mouth Once A Day 11)  Vitamin B-12 1000 Mcg Tabs (Cyanocobalamin) .... Take 1 Tablet By Mouth Once A Day 12)  Vitamin E 1000 Unit Caps (Vitamin E) .... 2 Tabs Once Daily 13)  Levothyroxine Sodium 100 Mcg Tabs (Levothyroxine Sodium) .... One and Half Tablets On Saturday and Sunday, One Tablet Monday Through Friday  Allergies (verified): No Known Drug Allergies  Review of Systems      See HPI Eyes:  Denies discharge and red eye. GI:  Complains of indigestion; gerd controlled with omeprazole. GU:  Complains of incontinence and urinary frequency; increased symptoms of incontinence. Psych:  Denies anxiety and depression. Endo:  Denies cold intolerance, excessive hunger, excessive thirst, excessive urination, and heat intolerance. Heme:  Denies abnormal bruising and bleeding; pt on chronic coumadin. Allergy:  Denies hives or rash and itching eyes.  Physical Exam  General:  Well-developed,well-nourished,in no acute distress; alert,appropriate and cooperative throughout examination HEENT: No facial asymmetry,  EOMI, No sinus tenderness, TM's Clear, oropharynx  pink and moist.   Chest: Clear to auscultation bilaterally.  CVS: S1, S2, No murmurs, No S3.   Abd: Soft, Nontender.  MS: Adequate ROM spine, hips, shoulders and knees.  Ext: No edema.   CNS: CN 2-12 intact, power tone and sensation normal throughout.   Skin: Intact, no visible lesions or rashes.  Psych: Good eye contact, normal affect.  Memory intact, not anxious or depressed appearing.    Impression & Recommendations:  Problem # 1:  UNSPECIFIED URINARY INCONTINENCE (ICD-788.30) Assessment Deteriorated  Orders: Medicare Electronic Prescription [phone])  Problem # 2:  IMPAIRED  FASTING GLUCOSE (ICD-790.21) Assessment: Improved  Labs Reviewed: Creat: 0.91 (10/06/2010)    HBA1C improved, Pt advised to reduce carbohydrate intake, espescially sweets, and to start regular physical activity, at least 30 minutes 5 days weekly, to enable weight loss, and reduce the risk of becoming diabetic   Problem # 3:  HYPERTENSION (ICD-401.9) Assessment: Improved  Her updated medication list for this problem includes:    Metoprolol Tartrate 100 Mg Tabs (Metoprolol tartrate) .Marland Kitchen... Take 1 tablet by mouth two times a day    Hydrochlorothiazide 25 Mg Tabs (Hydrochlorothiazide) .Marland Kitchen... Take 1 tablet by mouth once daily  Orders: T-Basic Metabolic Panel ([id number])  BP today: 132/80 Prior BP: 145/75 (08/16/2010)  Labs Reviewed: K+: 4.0 (10/06/2010) Creat: : 0.91 (10/06/2010)   Chol: 128 (10/06/2010)   HDL: 32 (10/06/2010)   LDL: 57 (10/06/2010)   TG: 196 (10/06/2010)  Problem # 4:  HYPERLIPIDEMIA (ICD-272.4) Assessment: Improved  Her updated medication list for this problem includes:    Lovastatin 40 Mg Tabs (Lovastatin) .Marland Kitchen... 2 tablets at bedtime  Orders: T-Hepatic Function [phone]) T-Lipid Profile [phone])  Labs Reviewed: SGOT: 16 (10/06/2010)   SGPT: 14 (10/06/2010)   HDL:32 (10/06/2010), 36 (06/02/2010)  LDL:57 (10/06/2010), 54 (06/02/2010)  Chol:128 (10/06/2010), 136 (06/02/2010)  Trig:196 (10/06/2010), 228 (06/02/2010)  Complete Medication List: 1)  Potassium 99 Mg Tabs (Potassium) .... Take 1 tablet by mouth once a day 2)  Lovastatin 40 Mg Tabs (Lovastatin) .... 2 tablets at bedtime 3)  Oxybutynin Chloride 5 Mg Xr24h-tab (Oxybutynin chloride) .... Take 1 tablet by mouth once a day 4)  Omeprazole 20 Mg Cpdr (Omeprazole) .... One cap by mouth once daily for acid reflux 5)  Metoprolol Tartrate 100 Mg Tabs (Metoprolol tartrate) .... Take 1 tablet by mouth two times a day 6)  Digoxin 0.125 Mg Tabs (Digoxin) .... Take 1 tablet by mouth once a day 7)  Warfarin  Sodium 5 Mg Tabs (Warfarin sodium) .... Uad 8)  Lorazepam 0.5 Mg Tabs (Lorazepam) .... One tablet at bedtime as needed 9)  Hydrochlorothiazide 25 Mg Tabs (Hydrochlorothiazide) .... Take 1 tablet by mouth once daily 10)  Vitamin D 1000 Unit Tabs (Cholecalciferol) .... Take 1 tablet by mouth once a day 11)  Vitamin B-12 1000 Mcg Tabs (Cyanocobalamin) .... Take 1 tablet by mouth once a day 12)  Vitamin E 1000 Unit Caps (Vitamin e) .... 2 tabs once daily 13)  Levothyroxine Sodium 100 Mcg Tabs (Levothyroxine sodium) .... One and half tablets on saturday and sunday, one tablet monday through friday  Other Orders: T-CBC w/Diff [phone]) T-TSH ([phone])  Patient Instructions: 1)  Please schedule a follow-up appointment in 4 months. 2)  It is important that you exercise regularly at least 20 minutes 5 times a week. If you develop chest pain, have severe difficulty breathing, or feel very tired , stop exercising immediately and seek medical attention. 3)  You need to lose weight. Consider a lower calorie diet and regular exercise.  4)  BMP prior to visit, ICD-9: 5)  Hepatic Panel prior to visit, ICD-9:  6)  Lipid Panel prior to visit, ICD-9:  fasting  in 4 months 7)  TSH prior to visit, ICD-9: 8)  CBC w/ Diff prior to visit, ICD-9: Prescriptions: OMEPRAZOLE 20 MG CPDR (OMEPRAZOLE) one cap by mouth once daily for acid reflux  #30 Each x 3   Entered by:   Kate Sable LPN   Authorized by:   Tula Nakayama MD   Signed by:   Kate Sable LPN on 624THL   Method used:   Electronically to        [facility] (retail)       [place] [address] [place], [place]  [address]       Ph: [phone]       Fax: [phone]   [id number] OXYBUTYNIN CHLORIDE 5 MG XR24H-TAB (OXYBUTYNIN CHLORIDE) Take 1 tablet by mouth once a day  #30 x 3   Entered by:   Kate Sable LPN   Authorized by:   Tula Nakayama MD   Signed by:   Kate Sable LPN on  624THL   Method used:   Electronically to        [facility] (retail)       [place] [address] [place], [place]  [address]       Ph: [phone]       Fax: [phone]   [id number] LOVASTATIN 40 MG TABS (LOVASTATIN) 2 tablets at bedtime  #60 Each x 3   Entered by:   Kate Sable LPN   Authorized by:   Tula Nakayama MD   Signed by:   Kate Sable LPN on 624THL   Method used:   Electronically to        [facility] (retail)       [place] [address] [place], [place]  [address]       Ph: [phone]       Fax: [phone]   RxID:   [id number]    Orders Added: 1)  Est. Patient Level IV [id number] 2)  T-Basic Metabolic Panel [id number] 3)  T-Hepatic Function [id number] 4)  T-Lipid Profile [[id number]] 5)  T-CBC w/Diff [id number] 6)  T-TSH [id number] 7)  Medicare Electronic Prescription [id number]

## 2010-10-26 ENCOUNTER — Encounter: Payer: Self-pay | Admitting: Cardiology

## 2010-10-26 ENCOUNTER — Encounter (INDEPENDENT_AMBULATORY_CARE_PROVIDER_SITE_OTHER): Payer: Medicare Other

## 2010-10-26 DIAGNOSIS — I4891 Unspecified atrial fibrillation: Secondary | ICD-10-CM

## 2010-10-26 DIAGNOSIS — Z7901 Long term (current) use of anticoagulants: Secondary | ICD-10-CM

## 2010-11-02 ENCOUNTER — Emergency Department (HOSPITAL_COMMUNITY): Payer: Medicare Other

## 2010-11-02 ENCOUNTER — Telehealth: Payer: Self-pay | Admitting: Family Medicine

## 2010-11-02 ENCOUNTER — Observation Stay (HOSPITAL_COMMUNITY)
Admission: EM | Admit: 2010-11-02 | Discharge: 2010-11-03 | Disposition: A | Discharge status: Home Health | Payer: Medicare Other | Attending: Internal Medicine | Admitting: Internal Medicine

## 2010-11-02 DIAGNOSIS — Z7901 Long term (current) use of anticoagulants: Secondary | ICD-10-CM | POA: Insufficient documentation

## 2010-11-02 DIAGNOSIS — S82009A Unspecified fracture of unspecified patella, initial encounter for closed fracture: Principal | ICD-10-CM | POA: Insufficient documentation

## 2010-11-02 DIAGNOSIS — E876 Hypokalemia: Secondary | ICD-10-CM | POA: Insufficient documentation

## 2010-11-02 DIAGNOSIS — I10 Essential (primary) hypertension: Secondary | ICD-10-CM | POA: Insufficient documentation

## 2010-11-02 DIAGNOSIS — E785 Hyperlipidemia, unspecified: Secondary | ICD-10-CM | POA: Insufficient documentation

## 2010-11-02 DIAGNOSIS — I4891 Unspecified atrial fibrillation: Secondary | ICD-10-CM | POA: Insufficient documentation

## 2010-11-02 DIAGNOSIS — W010XXA Fall on same level from slipping, tripping and stumbling without subsequent striking against object, initial encounter: Secondary | ICD-10-CM | POA: Insufficient documentation

## 2010-11-02 DIAGNOSIS — Z79899 Other long term (current) drug therapy: Secondary | ICD-10-CM | POA: Insufficient documentation

## 2010-11-02 DIAGNOSIS — E039 Hypothyroidism, unspecified: Secondary | ICD-10-CM | POA: Insufficient documentation

## 2010-11-02 LAB — BASIC METABOLIC PANEL
CO2: 27 mEq/L (ref 19–32)
Chloride: 96 mEq/L (ref 96–112)
GFR calc Af Amer: >60 mL/min (ref >60)
Sodium: 131 mEq/L — ABNORMAL LOW (ref 135–145)

## 2010-11-02 LAB — URINALYSIS, ROUTINE W REFLEX MICROSCOPIC
Bilirubin Urine: NEGATIVE
Ketones, ur: NEGATIVE mg/dL
Leukocytes, UA: NEGATIVE
Nitrite: NEGATIVE
Specific Gravity, Urine: 1.01 (ref 1.005–1.030)
Urobilinogen, UA: 0.2 mg/dL (ref 0.0–1.0)

## 2010-11-02 LAB — CBC
MCV: 85.7 fL (ref 78.0–100.0)
Platelets: 323 10*3/uL (ref 150–400)
RBC: 3.98 MIL/uL (ref 3.87–5.11)
RDW: 14.2 % (ref 11.5–15.5)
WBC: 18.7 10*3/uL — ABNORMAL HIGH (ref 4.0–10.5)

## 2010-11-02 LAB — DIFFERENTIAL
Basophils Relative: 0 % (ref 0–1)
Eosinophils Absolute: 0.1 10*3/uL (ref 0.0–0.7)
Eosinophils Relative: 0 % (ref 0–5)
Lymphs Abs: 2.2 10*3/uL (ref 0.7–4.0)
Neutrophils Relative %: 82 % — ABNORMAL HIGH (ref 43–77)

## 2010-11-03 NOTE — Medication Information (Signed)
Summary: ccr-lr  Anticoagulant Therapy  Managed by: Edrick Oh, RN PCP: Dr.Margaret Sandria Senter MD: Lattie Haw MD, Herbie [place] Indication 1: Atrial Fibrillation Lab Used: [facility] [place] Site: [place] INR POC 2.5  Dietary changes: no    Health status changes: no    Bleeding/hemorrhagic complications: no    Recent/future hospitalizations: no    Any changes in medication regimen? no    Recent/future dental: no  Any missed doses?: no       Is patient compliant with meds? yes       Allergies: No Known Drug Allergies  Anticoagulation Management History:      The patient is taking warfarin and comes in today for a routine follow up visit.  Positive risk factors for bleeding include an age of 75 years or older.  Negative risk factors for bleeding include no history of CVA/TIA, no history of GI bleeding, and absence of serious comorbidities.  The bleeding index is 'intermediate risk'.  Positive CHADS2 values include History of HTN and Age > 21 years old.  Negative CHADS2 values include History of CHF, History of Diabetes, and Prior Stroke/CVA/TIA.  Anticoagulation responsible provider: Lattie Haw MD, Herbie Baltimore.  INR POC: 2.5.  Cuvette Lot#: [id number].    Anticoagulation Management Assessment/Plan:      The patient's current anticoagulation dose is Warfarin sodium 5 mg tabs: uad.  The target INR is 2.0-3.0.  The next INR is due 11/21/2010.  Anticoagulation instructions were given to patient.  Results were reviewed/authorized by Edrick Oh, RN.  She was notified by Edrick Oh RN.         Prior Anticoagulation Instructions: INR 2.0 Continue coumadin 2.5mg  once daily except 5mg  on Thursdays  Current Anticoagulation Instructions: INR 2.5 Continue coumadin 2.5mg  once daily except 5mg  on Thursdays

## 2010-11-06 ENCOUNTER — Telehealth (INDEPENDENT_AMBULATORY_CARE_PROVIDER_SITE_OTHER): Payer: Self-pay | Admitting: *Deleted

## 2010-11-06 DIAGNOSIS — S82009A Unspecified fracture of unspecified patella, initial encounter for closed fracture: Secondary | ICD-10-CM | POA: Insufficient documentation

## 2010-11-07 ENCOUNTER — Telehealth: Payer: Self-pay | Admitting: *Deleted

## 2010-11-08 NOTE — Progress Notes (Signed)
Summary: speak with nurse  Phone Note Call from Patient   Summary of Call: daughter called and stated mom was walking dog and fell. hurt her knee. it swollen. and fell on hands to. daughter says pt states hard to walk on. please call and advise. [id number] Initial call taken by: Lenn Cal,  November 02, 2010 11:29 AM  Follow-up for Phone Call        advised er for eval Follow-up by: Baldomero Lamy LPN,  March  7, [id number] 1:13 PM

## 2010-11-08 NOTE — Consult Note (Signed)
  NAMEEZARIA, MELTZER                 ACCOUNT NO.:  [id number]  MEDICAL RECORD NO.:  [id number]           PATIENT TYPE:  LOCATION:                                 FACILITY:  PHYSICIAN:  J. Sanjuana Kava, M.D. DATE OF BIRTH:  [birth year]  DATE OF CONSULTATION: DATE OF DISCHARGE:                                CONSULTATION   REQUESTING PHYSICIAN:  Hospitalist.  The patient is a 77 year old female who fell yesterday and injured her left knee.  She was seen in the emergency room.  They called me last night.  She is on Coumadin and had hemarthrosis.  She was having more pain and they thought she should have and they got a CT and it showed very small hairline fracture to the lateral portion of the patella nondisplaced.  She was admitted last night for pain control by the Hospitalist.  She has knee immobilizer on.  When I came to see her, she was already in the hall walking with physical therapy and she was doing well with the walker.  I watched her, she walked around and then got to the bedside fully.  Neurovascularly, she is intact.  She does have hemarthrosis of the left knee.  I will not attempt to aspirate this.  The knee is stable.  She has some pain, but has been controlled with medicine I have given her.  IMPRESSION:  Small fracture of left patella, hemarthrosis of left knee secondary to being on Coumadin.  The patient is on Coumadin because of atrial fib.  She can continue this.  She does not need to stop it.  She needs to continue wearing a knee immobilizer.  She can use ice.  Use a walker.  She can take the knee immobilizer off today, but otherwise leave it on.  Do not bend the knee.  As long as she has no new events, as long she does some twist fall, bend, turn, or some abnormal movement to the knee or the patella, she will go ahead and heal as it is.  As far as hemarthrosis is slowly resolved, it may need to be aspirated, but generally this will resolve on its own  overtime.  I have explained this to her.  I recommend ice to the knee also.  I will see her in the office next week.  If any difficulties, she is to contact me through the office or hospital beeper system.          ______________________________ Lenna Sciara. Sanjuana Kava, M.D.    JWK/MEDQ  D:  11/03/2010  T:  11/04/2010  Job:  [id number]  Electronically Signed by Sanjuana Kava M.D. on 11/08/2010 07:27:23 AM

## 2010-11-09 ENCOUNTER — Telehealth: Payer: Self-pay | Admitting: Orthopedic Surgery

## 2010-11-15 LAB — CARDIAC PANEL(CRET KIN+CKTOT+MB+TROPI)
CK, MB: 1.6 ng/mL (ref 0.3–4.0)
Total CK: 66 U/L (ref 7–177)
Troponin I: 0.02 ng/mL (ref 0.00–0.06)

## 2010-11-15 LAB — CBC
HCT: 28 % — ABNORMAL LOW (ref 36.0–46.0)
HCT: 31.7 % — ABNORMAL LOW (ref 36.0–46.0)
Hemoglobin: 10.2 g/dL — ABNORMAL LOW (ref 12.0–15.0)
Hemoglobin: 11.1 g/dL — ABNORMAL LOW (ref 12.0–15.0)
MCHC: 35.6 g/dL (ref 30.0–36.0)
MCHC: 35.8 g/dL (ref 30.0–36.0)
MCHC: 35 g/dL (ref 30.0–36.0)
MCV: 85.2 fL (ref 78.0–100.0)
MCV: 85.4 fL (ref 78.0–100.0)
Platelets: 277 10*3/uL (ref 150–400)
RBC: 3.36 MIL/uL — ABNORMAL LOW (ref 3.87–5.11)
RDW: 13.8 % (ref 11.5–15.5)
RDW: 13.9 % (ref 11.5–15.5)
RDW: 13.6 % (ref 11.5–15.5)

## 2010-11-15 LAB — BASIC METABOLIC PANEL
BUN: 8 mg/dL (ref 6–23)
CO2: 28 mEq/L (ref 19–32)
Creatinine, Ser: 0.92 mg/dL (ref 0.4–1.2)
GFR calc non Af Amer: 58 mL/min — ABNORMAL LOW (ref >60)
GFR calc non Af Amer: 55 mL/min — ABNORMAL LOW (ref >60)
Glucose, Bld: 119 mg/dL — ABNORMAL HIGH (ref 70–99)
Potassium: 3.4 mEq/L — ABNORMAL LOW (ref 3.5–5.1)
Potassium: 4 mEq/L (ref 3.5–5.1)
Sodium: 137 mEq/L (ref 135–145)

## 2010-11-15 LAB — COMPREHENSIVE METABOLIC PANEL
BUN: 10 mg/dL (ref 6–23)
CO2: 26 mEq/L (ref 19–32)
Calcium: 8.9 mg/dL (ref 8.4–10.5)
Creatinine, Ser: 0.98 mg/dL (ref 0.4–1.2)
GFR calc non Af Amer: 54 mL/min — ABNORMAL LOW (ref >60)
Glucose, Bld: 103 mg/dL — ABNORMAL HIGH (ref 70–99)

## 2010-11-15 LAB — URINALYSIS, ROUTINE W REFLEX MICROSCOPIC
Ketones, ur: NEGATIVE mg/dL
Nitrite: NEGATIVE
Specific Gravity, Urine: 1.01 (ref 1.005–1.030)
pH: 5.5 (ref 5.0–8.0)

## 2010-11-15 LAB — PROTIME-INR
INR: 2.78 — ABNORMAL HIGH (ref 0.00–1.49)
INR: 1.73 — ABNORMAL HIGH (ref 0.00–1.49)
Prothrombin Time: 27.9 seconds — ABNORMAL HIGH (ref 11.6–15.2)
Prothrombin Time: 29.1 seconds — ABNORMAL HIGH (ref 11.6–15.2)

## 2010-11-15 LAB — DIFFERENTIAL
Basophils Absolute: 0 10*3/uL (ref 0.0–0.1)
Eosinophils Absolute: 0.1 10*3/uL (ref 0.0–0.7)
Eosinophils Absolute: 0.1 10*3/uL (ref 0.0–0.7)
Eosinophils Relative: 1 % (ref 0–5)
Lymphocytes Relative: 18 % (ref 12–46)
Lymphs Abs: 2 10*3/uL (ref 0.7–4.0)
Neutro Abs: 8.1 10*3/uL — ABNORMAL HIGH (ref 1.7–7.7)
Neutrophils Relative %: 71 % (ref 43–77)

## 2010-11-15 LAB — APTT: aPTT: 47 seconds — ABNORMAL HIGH (ref 24–37)

## 2010-11-15 LAB — POCT CARDIAC MARKERS: CKMB, poc: 1.9 ng/mL (ref 1.0–8.0)

## 2010-11-15 LAB — DIGOXIN LEVEL: Digoxin Level: 0.6 ng/mL — ABNORMAL LOW (ref 0.8–2.0)

## 2010-11-15 NOTE — Progress Notes (Signed)
Summary: referral  Phone Note Other Incoming   Caller: dr simpson Summary of Call: pls refer pt to dr Aline Brochure tomanage fractured patellar per family request, they do not wantdr keeling who saw the pt in the hospital,if he refuses, pls find out if family wants another ortho in [place] or [place] if they will agree to seeing her, pls let me know if ther is a problem Initial call taken by: Tula Nakayama MD,  November 06, 2010 11:21 AM  Follow-up for Phone Call        sent information over to dr. Aline Brochure office.  Follow-up by: Lenn Cal,  November 07, 2010 9:28 AM  Additional Follow-up for Phone Call Additional follow up Details #1::        sent information into dr. Althia Forts office. pts daughter was called and is aware Additional Follow-up by: Lenn Cal,  November 07, 2010 10:21 AM  New Problems: FRACTURE, PATELLA (ICD-822.0)   New Problems: FRACTURE, PATELLA (ICD-822.0)

## 2010-11-15 NOTE — Progress Notes (Signed)
Summary: Fx care patient wishes to transfer care here,ref Dr Moshe Cipro  Phone Note Other Incoming   Summary of Call: Spoke with patient's daughter and patient:  Referral received regarding "Fracture, patella", received from Dr. Griffin Dakin office.  Treatment has been started with Dr Luna Glasgow, per [facility] referral on 11/02/10.  Patient and family request transfer of care to Dr. Aline Brochure.   Dr Luna Glasgow has seen patient in hospital, as daughter states patient was admitted fol'g ER visit, due to increased bleeding, patient is on Coumadin.  States her cardiologist is Dr.Rothbart, [place].  Dr. Luna Glasgow was to see patient yesterday, 11/08/10, and daughter said that patient was not informed of the appointment. States Dr. Brooke Bonito office has offered appointment today if patient wishes to go there.  Please advise about patient transferring care to our office. Initial call taken by: Ihor Austin,  November 09, 2010 10:52 AM  Follow-up for Phone Call        we cant see her for 4 weeks she should go to dr Luna Glasgow s office today for follow up  Follow-up by: Arther Abbott MD,  November 09, 2010 11:20 AM  Additional Follow-up for Phone Call Additional follow up Details #1::        advised. phone call completed. Additional Follow-up by: Ihor Austin,  November 09, 2010 12:53 PM

## 2010-11-15 NOTE — Progress Notes (Signed)
Summary: Coumadin concerns  Phone Note Call from Patient   Caller: Daughter Kassie Mends) Reason for Call: Talk to Nurse Summary of Call: would like to speak with you regarding coumadin / pt's daughter states that pt wanted her "to call Lattie Haw" / when told daughter she needed to call Dr.Keeling back regarding the swelling on pt's knee, she stated they were no longer going to see him / tg Initial call taken by: Alphonsus [facility],  November 07, 2010 11:06 AM  Follow-up for Phone Call        Pt has been off coumadin Sat and Sunday per Dr Moshe Cipro until pt could talk with [place] today.  Swelling has gone down in knee and pt wants to know if she should resume coumadin.  Per pt Dr Luna Glasgow is not planning on doing any invasive procedures, just immobilization.  Pt has a fib and H/O CVA.  Pt will resume coumadin tonight at regular dose of 2.5mg  once daily except 5mg  on Thursdays.  Recheck INR on 11/21/10 as previously scheduled. Follow-up by: Edrick Oh RN,  November 07, 2010 1:26 PM

## 2010-11-16 LAB — CBC
HCT: 34.6 % — ABNORMAL LOW (ref 36.0–46.0)
Hemoglobin: 11.2 g/dL — ABNORMAL LOW (ref 12.0–15.0)
Hemoglobin: 12.2 g/dL (ref 12.0–15.0)
Hemoglobin: 12.4 g/dL (ref 12.0–15.0)
Hemoglobin: 12.9 g/dL (ref 12.0–15.0)
MCHC: 34.8 g/dL (ref 30.0–36.0)
MCHC: 35.2 g/dL (ref 30.0–36.0)
MCV: 86 fL (ref 78.0–100.0)
Platelets: 309 10*3/uL (ref 150–400)
Platelets: 327 10*3/uL (ref 150–400)
Platelets: 333 10*3/uL (ref 150–400)
Platelets: 392 10*3/uL (ref 150–400)
RBC: 3.71 MIL/uL — ABNORMAL LOW (ref 3.87–5.11)
RBC: 4.07 MIL/uL (ref 3.87–5.11)
RBC: 4.07 MIL/uL (ref 3.87–5.11)
RBC: 4.34 MIL/uL (ref 3.87–5.11)
RDW: 12.7 % (ref 11.5–15.5)
RDW: 13.2 % (ref 11.5–15.5)
RDW: 13.4 % (ref 11.5–15.5)
RDW: 13.4 % (ref 11.5–15.5)
WBC: 11.1 10*3/uL — ABNORMAL HIGH (ref 4.0–10.5)
WBC: 11.8 10*3/uL — ABNORMAL HIGH (ref 4.0–10.5)
WBC: 16.3 10*3/uL — ABNORMAL HIGH (ref 4.0–10.5)

## 2010-11-16 LAB — DIFFERENTIAL
Band Neutrophils: 0 % (ref 0–10)
Basophils Absolute: 0 10*3/uL (ref 0.0–0.1)
Basophils Absolute: 0 10*3/uL (ref 0.0–0.1)
Basophils Absolute: 0 10*3/uL (ref 0.0–0.1)
Basophils Absolute: 0 10*3/uL (ref 0.0–0.1)
Basophils Absolute: 0.1 10*3/uL (ref 0.0–0.1)
Basophils Relative: 0 % (ref 0–1)
Basophils Relative: 0 % (ref 0–1)
Basophils Relative: 1 % (ref 0–1)
Eosinophils Absolute: 0.1 10*3/uL (ref 0.0–0.7)
Eosinophils Absolute: 0.1 10*3/uL (ref 0.0–0.7)
Eosinophils Relative: 1 % (ref 0–5)
Eosinophils Relative: 1 % (ref 0–5)
Eosinophils Relative: 2 % (ref 0–5)
Lymphocytes Relative: 15 % (ref 12–46)
Lymphocytes Relative: 20 % (ref 12–46)
Lymphocytes Relative: 25 % (ref 12–46)
Lymphocytes Relative: 31 % (ref 12–46)
Lymphs Abs: 2.5 10*3/uL (ref 0.7–4.0)
Lymphs Abs: 2.8 10*3/uL (ref 0.7–4.0)
Monocytes Absolute: 0.7 10*3/uL (ref 0.1–1.0)
Monocytes Absolute: 0.8 10*3/uL (ref 0.1–1.0)
Monocytes Absolute: 0.9 10*3/uL (ref 0.1–1.0)
Monocytes Absolute: 1.4 10*3/uL — ABNORMAL HIGH (ref 0.1–1.0)
Monocytes Relative: 7 % (ref 3–12)
Monocytes Relative: 8 % (ref 3–12)
Neutro Abs: 12.3 10*3/uL — ABNORMAL HIGH (ref 1.7–7.7)
Neutro Abs: 7.8 10*3/uL — ABNORMAL HIGH (ref 1.7–7.7)
Neutrophils Relative %: 58 % (ref 43–77)
Neutrophils Relative %: 75 % (ref 43–77)

## 2010-11-16 LAB — COMPREHENSIVE METABOLIC PANEL
ALT: 15 U/L (ref 0–35)
ALT: 17 U/L (ref 0–35)
AST: 15 U/L (ref 0–37)
AST: 20 U/L (ref 0–37)
Albumin: 3.3 g/dL — ABNORMAL LOW (ref 3.5–5.2)
Albumin: 3.8 g/dL (ref 3.5–5.2)
Alkaline Phosphatase: 104 U/L (ref 39–117)
Alkaline Phosphatase: 86 U/L (ref 39–117)
CO2: 24 mEq/L (ref 19–32)
CO2: 25 mEq/L (ref 19–32)
Calcium: 10 mg/dL (ref 8.4–10.5)
Calcium: 8.9 mg/dL (ref 8.4–10.5)
Chloride: 101 mEq/L (ref 96–112)
Chloride: 104 mEq/L (ref 96–112)
Chloride: 99 mEq/L (ref 96–112)
Creatinine, Ser: 1.11 mg/dL (ref 0.4–1.2)
Creatinine, Ser: 1.24 mg/dL — ABNORMAL HIGH (ref 0.4–1.2)
GFR calc Af Amer: 50 mL/min — ABNORMAL LOW (ref >60)
GFR calc Af Amer: 57 mL/min — ABNORMAL LOW (ref >60)
GFR calc non Af Amer: 40 mL/min — ABNORMAL LOW (ref >60)
GFR calc non Af Amer: 41 mL/min — ABNORMAL LOW (ref >60)
Glucose, Bld: 117 mg/dL — ABNORMAL HIGH (ref 70–99)
Potassium: 3.6 mEq/L (ref 3.5–5.1)
Sodium: 132 mEq/L — ABNORMAL LOW (ref 135–145)
Sodium: 136 mEq/L (ref 135–145)
Total Bilirubin: 0.5 mg/dL (ref 0.3–1.2)
Total Bilirubin: 0.6 mg/dL (ref 0.3–1.2)
Total Bilirubin: 0.8 mg/dL (ref 0.3–1.2)
Total Protein: 6.5 g/dL (ref 6.0–8.3)

## 2010-11-16 LAB — BLOOD GAS, ARTERIAL
Patient temperature: 37
Patient temperature: 37
TCO2: 19 mmol/L (ref 0–100)
TCO2: 20 mmol/L (ref 0–100)
pCO2 arterial: 30.4 mmHg — ABNORMAL LOW (ref 35.0–45.0)
pCO2 arterial: 31.5 mmHg — ABNORMAL LOW (ref 35.0–45.0)
pH, Arterial: 7.456 — ABNORMAL HIGH (ref 7.350–7.400)
pH, Arterial: 7.463 — ABNORMAL HIGH (ref 7.350–7.400)

## 2010-11-16 LAB — BASIC METABOLIC PANEL
BUN: 16 mg/dL (ref 6–23)
CO2: 23 mEq/L (ref 19–32)
CO2: 26 mEq/L (ref 19–32)
Calcium: 9 mg/dL (ref 8.4–10.5)
Calcium: 9.4 mg/dL (ref 8.4–10.5)
Chloride: 102 mEq/L (ref 96–112)
Chloride: 102 mEq/L (ref 96–112)
Creatinine, Ser: 0.94 mg/dL (ref 0.4–1.2)
GFR calc Af Amer: 58 mL/min — ABNORMAL LOW (ref >60)
GFR calc Af Amer: >60 mL/min (ref >60)
GFR calc non Af Amer: 58 mL/min — ABNORMAL LOW (ref >60)
Glucose, Bld: 109 mg/dL — ABNORMAL HIGH (ref 70–99)
Glucose, Bld: 111 mg/dL — ABNORMAL HIGH (ref 70–99)
Glucose, Bld: 126 mg/dL — ABNORMAL HIGH (ref 70–99)
Potassium: 3.5 mEq/L (ref 3.5–5.1)
Sodium: 133 mEq/L — ABNORMAL LOW (ref 135–145)
Sodium: 136 mEq/L (ref 135–145)
Sodium: 136 mEq/L (ref 135–145)

## 2010-11-16 LAB — URINALYSIS, ROUTINE W REFLEX MICROSCOPIC
Bilirubin Urine: NEGATIVE
Glucose, UA: NEGATIVE mg/dL
Glucose, UA: NEGATIVE mg/dL
Glucose, UA: NEGATIVE mg/dL
Ketones, ur: NEGATIVE mg/dL
Ketones, ur: NEGATIVE mg/dL
Leukocytes, UA: NEGATIVE
Nitrite: NEGATIVE
Nitrite: NEGATIVE
Protein, ur: NEGATIVE mg/dL
Specific Gravity, Urine: <1.005 — ABNORMAL LOW (ref 1.005–1.030)
pH: 6 (ref 5.0–8.0)
pH: 6 (ref 5.0–8.0)

## 2010-11-16 LAB — CARDIAC PANEL(CRET KIN+CKTOT+MB+TROPI)
CK, MB: 3.8 ng/mL (ref 0.3–4.0)
Relative Index: 3.5 — ABNORMAL HIGH (ref 0.0–2.5)
Relative Index: INVALID (ref 0.0–2.5)
Total CK: 109 U/L (ref 7–177)
Troponin I: 0.01 ng/mL (ref 0.00–0.06)
Troponin I: 0.01 ng/mL (ref 0.00–0.06)
Troponin I: 0.02 ng/mL (ref 0.00–0.06)
Troponin I: 0.02 ng/mL (ref 0.00–0.06)

## 2010-11-16 LAB — POCT CARDIAC MARKERS
CKMB, poc: 5.6 ng/mL (ref 1.0–8.0)
Myoglobin, poc: 227 ng/mL (ref 12–200)
Troponin i, poc: <0.05 ng/mL (ref 0.00–0.09)

## 2010-11-16 LAB — T3, FREE: T3, Free: 3 pg/mL (ref 2.3–4.2)

## 2010-11-16 LAB — PROTIME-INR
INR: 1.09 (ref 0.00–1.49)
INR: 1.96 — ABNORMAL HIGH (ref 0.00–1.49)
Prothrombin Time: 14 seconds (ref 11.6–15.2)
Prothrombin Time: 14.2 seconds (ref 11.6–15.2)
Prothrombin Time: 22.2 seconds — ABNORMAL HIGH (ref 11.6–15.2)
Prothrombin Time: 26.1 seconds — ABNORMAL HIGH (ref 11.6–15.2)

## 2010-11-16 LAB — BRAIN NATRIURETIC PEPTIDE
Pro B Natriuretic peptide (BNP): 370 pg/mL — ABNORMAL HIGH (ref 0.0–100.0)
Pro B Natriuretic peptide (BNP): 407 pg/mL — ABNORMAL HIGH (ref 0.0–100.0)

## 2010-11-16 LAB — D-DIMER, QUANTITATIVE
D-Dimer, Quant: 0.43 ug/mL-FEU (ref 0.00–0.48)
D-Dimer, Quant: 0.66 ug/mL-FEU — ABNORMAL HIGH (ref 0.00–0.48)

## 2010-11-16 LAB — URINE MICROSCOPIC-ADD ON

## 2010-11-16 LAB — T4, FREE: Free T4: 1.35 ng/dL (ref 0.80–1.80)

## 2010-11-16 LAB — LIPID PANEL
Cholesterol: 130 mg/dL (ref 0–200)
HDL: 41 mg/dL (ref >39)

## 2010-11-16 LAB — MRSA PCR SCREENING: MRSA by PCR: NEGATIVE

## 2010-11-16 LAB — APTT: aPTT: 27 seconds (ref 24–37)

## 2010-11-16 LAB — DIGOXIN LEVEL: Digoxin Level: 1.6 ng/mL (ref 0.8–2.0)

## 2010-11-16 NOTE — H&P (Signed)
NAMERAGINI, MEHLHAFF NO.:  [id number]  MEDICAL RECORD NO.:  [id number]           PATIENT TYPE:  E  LOCATION:  APED                          FACILITY:  [facility]  PHYSICIAN:  Orvan Falconer, MD           DATE OF BIRTH:  11/11/1925  DATE OF ADMISSION:  11/02/2010 DATE OF DISCHARGE:  LH                             HISTORY & PHYSICAL   PRIMARY CARE PHYSICIAN:  Norwood Levo. Moshe Cipro, MD  REASON FOR ADMISSION:  Left knee patellar fracture and hemarthrosis.  ADVANCE DIRECTIVE:  Full code.  HISTORY OF PRESENT ILLNESS:  This is an 77 year old female with history of atrial fibrillation status post prior ablation on chronic Coumadin with a CHADS score of 2, history of hypothyroidism on supplement, history of bradycardia, sleep apnea, hypertension who fell today ,tripping on the grass while she was walking her dog, and suffered a patellar fracture with a large knee joint effusion.  It was suspected that this might be a hemarthrosis.  She cannot put any weight on her knee because of the pain.  Her INR is therapeutic at 2.18.  Orthopedics, Dr. Luna Glasgow was consulted by the emergency room physician who offered to see her in the office in the morning.  The patient, however has pain and unable to ambulate, and thus hospitalist was asked to admit the patient for pain control.  She denied any chest pain, shortness of breath, nausea, vomiting, or any other symptomatology.  PAST MEDICAL HISTORY:  Atrial fibrillation, bronchitis, hypercholesterolemia, hypertension, and hypothyroidism.  No history of congestive heart failure or stroke.  No history of diabetes.  I was told that she had an ablation done before.  She does have history of sleep apnea and the last titration in the record showed that she was at 11 cm of water.  ALLERGIES:  No known drug allergies.  CURRENT MEDICATIONS:  Coumadin, metoprolol 100 mg b.i.d., HCTZ 25 mg per day, Synthroid 100 mcg per day, lovastatin 40 mg per  day, digoxin 0.125 mg per day, 2 L of O2, and oxybutynin 5 mg per day.  PHYSICAL EXAMINATION:  VITAL SIGNS:  Blood pressure 150/56, pulse of 60, respiratory rate of 20, and temperature 97.8. GENERAL:  Shows that she is alert and oriented and conversing.  She has no facial droop.  Tongue is midline.  Uvula elevated with phonation. NECK: Supple. CARDIAC:  Revealed S1 and S2 regular.  I did not hear any murmur, rub, or gallop. LUNGS:  Clear. ABDOMEN:  Soft, nondistended, and nontender. EXTREMITIES:  Left leg is in the hard splint and right leg without any edema.  No calf tenderness.  Good distal pulses bilaterally. NEUROLOGIC:  Unremarkable. PSYCHIATRIC:  Unremarkable as well.  OBJECTIVE FINDINGS:  White count 18.7,000 and hemoglobin of 11.7.  Serum sodium 131, potassium 3.4, glucose 115, and creatinine 0.98.  INR of 2.18.  X-ray shows lots of knee joint effusion with possible patellar fracture over the lateral side.  IMPRESSION:  This is a 77 year old female with history of atrial fibrillation on anticoagulation for CHADS score of 2, hypothyroidism, sleep apnea, hypertension who  had a mechanical fall fracturing her patella, and likely has hemarthrosis of her left knee joint.  We will admit her for pain control. Because of the lower CHADS score,  I will stop her Coumadin until her knee is better, and at some point, will likely need to resume her anticoagulation.  I will continue all her medications except the HCTZ as she has hyponatremia and hypokalemia.  We will give potassium supplement at 40 mEq per day.  I will use Dilaudid for pain control.  Since she is on digoxin, we will check a level as well.  We will get an EKG just to be complete.  We  will admit her to [facility] Team II.  She is a full code.     Orvan Falconer, MD     PL/MEDQ  D:  11/02/2010  T:  11/02/2010  Job:  [id number]  cc:   Norwood Levo. Moshe Cipro, M.D. Fax: [phone]  J. Sanjuana Kava, M.D. Fax:  [phone]  Electronically Signed by Orvan Falconer  on 11/16/2010 02:13:09 AM

## 2010-11-18 ENCOUNTER — Encounter: Payer: Self-pay | Admitting: Cardiology

## 2010-11-18 DIAGNOSIS — Z7901 Long term (current) use of anticoagulants: Secondary | ICD-10-CM | POA: Insufficient documentation

## 2010-11-18 DIAGNOSIS — I4891 Unspecified atrial fibrillation: Secondary | ICD-10-CM

## 2010-11-21 ENCOUNTER — Encounter: Payer: PRIVATE HEALTH INSURANCE | Admitting: *Deleted

## 2010-11-23 ENCOUNTER — Ambulatory Visit (INDEPENDENT_AMBULATORY_CARE_PROVIDER_SITE_OTHER): Payer: Medicare Other | Admitting: *Deleted

## 2010-11-23 DIAGNOSIS — I4891 Unspecified atrial fibrillation: Secondary | ICD-10-CM

## 2010-11-23 DIAGNOSIS — Z7901 Long term (current) use of anticoagulants: Secondary | ICD-10-CM

## 2010-11-23 LAB — POCT INR: INR: 2.6

## 2010-12-08 NOTE — Discharge Summary (Signed)
  [name], [name]                 ACCOUNT NO.:  [id number]  MEDICAL RECORD NO.:  [id number]           PATIENT TYPE:  I  LOCATION:  [id number]                          FACILITY:  [facility]  PHYSICIAN:  Bibiana Gillean L. Conley Canal, MDDATE OF BIRTH:  09-24-1925  DATE OF ADMISSION:  11/02/2010 DATE OF DISCHARGE:  03/08/2012LH                              DISCHARGE SUMMARY   DISCHARGE DIAGNOSES: 1. Left patellar fracture and hemarthrosis. 2. Atrial fibrillation with history of pacemaker. 3. Hypertension. 4. Hypothyroidism. 5. Hyperlipidemia. 6. Hypokalemia.  DISCHARGE MEDICATIONS: 1. Tylenol 650 mg p.o. q.4 h. p.r.n. pain. 2. Vicodin 5/325 one p.o. q.4 h. p.r.n. pain. 3. Digoxin 0.125 mg a day. 4. Hydrochlorothiazide 25 mg a day. 5. Synthroid 150 mcg on Saturday and Sunday and 100 mcg on Monday     through Friday. 6. Lovastatin 80 mg nightly. 7. Metoprolol 100 mg twice a day. 8. Omeprazole 20 mg a day. 9. Calcium gluconate 99 mg a day. 10.Warfarin 2.5 mg every day except 5 mg on Thursdays.  CONDITION:  Stable.  ACTIVITY:  She is to wear the knee immobilizer on the left leg at all times except when bathing and she is not to bend her left knee.  DIET:  It should be warfarin compatible.  CONSULTATIONS:  Iona Hansen, MD  PROCEDURES:  None.  LABORATORY DATA:  CBC on admission significant for white blood cell count of 18,000, hemoglobin 11.7, and hematocrit 34.1.  INR 2.8.  Basic metabolic panel significant for a sodium of 131 and potassium 3.4. Digoxin level 0.5.  Urinalysis showed trace blood, otherwise negative.  DIAGNOSTICS:  Chest x-ray showed nothing acute.  CT of the left knee showed minimally displaced lateral patellar fracture with an associated hemarthrosis and tricompartmental degenerative changes.  Left knee x-ray showed large knee joint effusion, osseous demineralization, osteoarthritic changes, and question of CPPD.  EKG showed atrial fibrillation with demand  pacing.  HISTORY AND HOSPITAL COURSE:  Please see H and P for details.  Ms. Mccarry is a pleasant 77 year old white female who fell and sustained a patellar fracture.  She was to be discharged from the ER after the ED physician spoke with Dr. Luna Glasgow.  However, she was unable to ambulate and was therefore placed on observation on the Hospitalist Service.  Dr. Luna Glasgow was consulted and recommended knee immobilizer and outpatient followup. The patient will follow up in his office next week.  She worked with physical therapy and has done well with a walker.  We will also arrange home physical therapy and a bedside commode.     Lawton Dollinger L. Conley Canal, MD     CLS/MEDQ  D:  11/03/2010  T:  11/04/2010  Job:  [id number]  cc:   Norwood Levo. Moshe Cipro, M.D. Fax: [phone]  J. Sanjuana Kava, M.D. [phone]  Electronically Signed by Doree Barthel MD on 12/08/2010 10:59:28 AM

## 2010-12-13 DIAGNOSIS — I10 Essential (primary) hypertension: Secondary | ICD-10-CM

## 2010-12-13 DIAGNOSIS — S8290XD Unspecified fracture of unspecified lower leg, subsequent encounter for closed fracture with routine healing: Secondary | ICD-10-CM

## 2010-12-13 DIAGNOSIS — I4891 Unspecified atrial fibrillation: Secondary | ICD-10-CM

## 2010-12-13 DIAGNOSIS — IMO0001 Reserved for inherently not codable concepts without codable children: Secondary | ICD-10-CM

## 2010-12-13 DIAGNOSIS — Z9181 History of falling: Secondary | ICD-10-CM

## 2010-12-14 ENCOUNTER — Encounter: Payer: PRIVATE HEALTH INSURANCE | Admitting: *Deleted

## 2010-12-14 ENCOUNTER — Encounter: Payer: Self-pay | Admitting: Orthopedic Surgery

## 2010-12-14 ENCOUNTER — Ambulatory Visit (INDEPENDENT_AMBULATORY_CARE_PROVIDER_SITE_OTHER): Payer: Medicare Other | Admitting: Orthopedic Surgery

## 2010-12-14 ENCOUNTER — Encounter: Payer: Self-pay | Admitting: *Deleted

## 2010-12-14 VITALS — HR 70 | Resp 16 | Ht 66.0 in

## 2010-12-14 DIAGNOSIS — S82009A Unspecified fracture of unspecified patella, initial encounter for closed fracture: Secondary | ICD-10-CM

## 2010-12-14 NOTE — Discharge Summary (Signed)
Separate identifiable. X-ray report.  AP, lateral, LEFT knee.  Reason for x-ray fracture evaluation.  No fracture is seen on the patella. At this time. There is irregularity at the inferior pole, which may match the area in question noted on CT scan. She does have medial compartment gonarthrosis.  Impression healed patella fracture with varus osteoarthritis of the knee

## 2010-12-14 NOTE — Progress Notes (Signed)
Chief complaint LEFT knee pain.  The patient was seen by Dr. Luna Glasgow, diagnosed with a nondisplaced patella fracture by CAT scan on March 7 secondary to a fall. She was placed in a knee immobilizer kept on a walker and has progressed well, but wanted to transfer care.  I have found no problems with the care received to this point, appropriate treatment was rendered.  She has dull pain in her LEFT knee, it is rated 5/10. It is worse with exercise and movement. Initially, because she is on Coumadin. She had some blood around the knee and was kept in the hospital. She received physical therapy at home with [facility] home [facility] and progressed well.  Review of systems she does have occasional chest palpitations, shortness of breath, and she snores. She has heartburn, constipation, frequency, and urgency. She has itching, occasional dizziness, easy bleeding and bruising secondary to Coumadin and seasonal allergies. She denies blurred vision, unexpected weight loss, excessive thirst, or urination.  Family History  Problem Relation Age of Onset  . Cancer Mother     gential  . Cancer Father     throat   . Pneumonia Sister   . Heart disease    . Arthritis    . Lung disease    . Asthma     Past Medical History  Diagnosis Date  . Hypertension   . Hyperlipidemia   . Pancreatitis   . GERD (gastroesophageal reflux disease)   . Impaired glucose tolerance   . Hypothyroidism   . Macular degeneration   . Sleep apnea   . Arthritis   . Atrial fibrillation    Past Medical History  Diagnosis Date  . Hypertension   . Hyperlipidemia   . Pancreatitis   . GERD (gastroesophageal reflux disease)   . Impaired glucose tolerance   . Hypothyroidism   . Macular degeneration   . Sleep apnea   . Arthritis   . Atrial fibrillation    History  Smoking status  . Former Smoker  . Types: Cigarettes  Smokeless tobacco  . Not on file   Social history she is widowed. She does not smoke or drink  alcohol.  General appearance her body frame is large.  She is well-developed and nourished, grooming, and hygiene, are normal.  She is oriented x3.  Her mood and affect are normal.  She ambulates weightbearing with a walker.  Her upper extremities are well aligned.  Her LEFT knee is tender over the patella. There is no joint effusion. Her range of motion is 90. The collateral ligaments are stable to anterior and posterior cruciate ligament is stable. She has a full straight leg raise with no extensor lag. Skin is intact.  She has her post-veins, but pulses are normal. Temperatures normal. There is minimal edema.  Sensation normal. Balance and coordination seem good.  X-ray was obtained and compared to previous x-rays. The CAT scan was reviewed. She has a nondisplaced patella fracture. There's been no displacement.  Recommend brace removal. Physical therapy at home come back in 2 months to check her range of motion and discharge at that time.  Addendum: We could not wait her today

## 2010-12-19 ENCOUNTER — Ambulatory Visit (INDEPENDENT_AMBULATORY_CARE_PROVIDER_SITE_OTHER): Payer: Medicare Other | Admitting: *Deleted

## 2010-12-19 DIAGNOSIS — Z7901 Long term (current) use of anticoagulants: Secondary | ICD-10-CM

## 2010-12-19 DIAGNOSIS — I4891 Unspecified atrial fibrillation: Secondary | ICD-10-CM

## 2010-12-20 ENCOUNTER — Ambulatory Visit (INDEPENDENT_AMBULATORY_CARE_PROVIDER_SITE_OTHER): Payer: Medicare Other | Admitting: Internal Medicine

## 2010-12-20 ENCOUNTER — Encounter: Payer: Self-pay | Admitting: Internal Medicine

## 2010-12-20 DIAGNOSIS — I4891 Unspecified atrial fibrillation: Secondary | ICD-10-CM

## 2010-12-20 DIAGNOSIS — I495 Sick sinus syndrome: Secondary | ICD-10-CM

## 2010-12-20 DIAGNOSIS — I10 Essential (primary) hypertension: Secondary | ICD-10-CM

## 2010-12-20 DIAGNOSIS — Z950 Presence of cardiac pacemaker: Secondary | ICD-10-CM

## 2010-12-20 NOTE — Progress Notes (Signed)
HPI [name] returns today for pacemaker followup. She has a history of symptomatic bradycardia as well as atrial fibrillation. She is status post pacemaker insertion. She has been maintained on a strategy of rate control and Coumadin therapy. She has no specific complaints today. She denies chest pain shortness of breath or syncope. She has very minimal peripheral edema. No Known Allergies   Current Outpatient Prescriptions  Medication Sig Dispense Refill  . cholecalciferol (VITAMIN D) 1000 UNIT tablet Take 1,000 Units by mouth daily.        . digoxin (LANOXIN) 0.125 MG tablet Take 125 mcg by mouth daily. Take one tablet by mouth once a day       . hydrochlorothiazide 25 MG tablet Take 25 mg by mouth daily. Take one tablet by mouth once daily       . HYDROcodone-acetaminophen (NORCO) 5-325 MG per tablet Take 1 tablet by mouth every 4 (four) hours as needed.        Marland Kitchen levothyroxine (SYNTHROID, LEVOTHROID) 100 MCG tablet Take 100 mcg by mouth daily. One and half tablets on Saturday and Sunday, one tablet on Monday through Friday       . lovastatin (MEVACOR) 40 MG tablet Take 40 mg by mouth at bedtime. 2 tablets at bedtime       . metoprolol (LOPRESSOR) 100 MG tablet Take 100 mg by mouth 2 (two) times daily. Take one tablet two times daily       . omeprazole (PRILOSEC) 20 MG capsule Take 20 mg by mouth daily. One cap by mouth once daily for acid reflux        . Potassium 99 MG TABS Take by mouth daily.        . vitamin B-12 (CYANOCOBALAMIN) 1000 MCG tablet Take 1,000 mcg by mouth daily. Take 1 tablet by mouth once a day       . vitamin E (VITAMIN E) 1000 UNIT capsule Take 1,000 Units by mouth daily.        Marland Kitchen warfarin (COUMADIN) 5 MG tablet Take 5 mg by mouth as directed. Use as directed       . DISCONTD: LORazepam (ATIVAN) 0.5 MG tablet Take 0.5 mg by mouth at bedtime as needed. One tablet at bedtime as needed       . DISCONTD: oxybutynin (DITROPAN-XL) 5 MG 24 hr tablet Take 5 mg by mouth daily.  Take one tablet by mouth once a day          Past Medical History  Diagnosis Date  . Hypertension   . Hyperlipidemia   . Pancreatitis   . GERD (gastroesophageal reflux disease)   . Impaired glucose tolerance   . Hypothyroidism   . Macular degeneration   . Sleep apnea   . Arthritis   . Atrial fibrillation     ROS:   All systems reviewed and negative except as noted in the HPI.   Past Surgical History  Procedure Date  . Vesicovaginal fistula closure w/ tah   . Salpingoophorectomy 1970    right  . Bilateral cataract surgery     extraction  . Pacemaker insertion 2011    dual chamber      Family History  Problem Relation Age of Onset  . Cancer Mother     gential  . Cancer Father     throat   . Pneumonia Sister   . Heart disease    . Arthritis    . Lung disease    . Asthma  History   Social History  . Marital Status: Widowed    Spouse Name: N/A    Number of Children: 7  . Years of Education: college   Occupational History  . retired     Social History Main Topics  . Smoking status: Former Smoker    Types: Cigarettes  . Smokeless tobacco: Not on file  . Alcohol Use: No  . Drug Use: Not on file  . Sexually Active: Not on file   Other Topics Concern  . Not on file   Social History Narrative  . No narrative on file     BP 126/72  Pulse 69  Ht 5\' 7"  (1.702 m)  Wt 175 lb (79.379 kg)  BMI 27.41 kg/m2  Physical Exam:  Well appearing NAD HEENT: Unremarkable Neck:  No JVD, no thyromegally Lymphatics:  No adenopathy Back:  No CVA tenderness Lungs:  Clear.well-healed pacemaker incision. HEART:  Regular rate rhythm, no murmurs, no rubs, no clicks Abd:  Flat, positive bowel sounds, no organomegally, no rebound, no guarding Ext:  2 plus pulses, no edema, no cyanosis, no clubbing Skin:  No rashes no nodules Neuro:  CN II through XII intact, motor grossly intact  DEVICE  Normal device function.  See PaceArt for details.   Assess/Plan:

## 2010-12-20 NOTE — Assessment & Plan Note (Signed)
Her blood pressure appears to be well-controlled. She will maintain a low-sodium diet. She will continue her current medications.

## 2010-12-20 NOTE — Assessment & Plan Note (Signed)
Her device is working normally. Will recheck in several months. She is pacing in the ventricle approximately 75% of the time.

## 2010-12-20 NOTE — Patient Instructions (Signed)
**Note De-identified  Obfuscation** Your physician recommends that you schedule a follow-up appointment in: 1 year  

## 2010-12-20 NOTE — Assessment & Plan Note (Signed)
Her symptoms remain well-controlled. She remains in atrial fibrillation less than 10% of the time. She will continue her current medical therapy including anticoagulation with warfarin.

## 2011-01-10 NOTE — Consult Note (Signed)
[name], [name]                 ACCOUNT NO.:  [id number]   MEDICAL RECORD NO.:  [id number]          PATIENT TYPE:  INP   LOCATION:  [id number]                          FACILITY:  [facility]   PHYSICIAN:  Caro Hight, M.D.      DATE OF BIRTH:  [birth year]   DATE OF CONSULTATION:  05/08/2007  DATE OF DISCHARGE:                                 CONSULTATION   GASTROENTEROLOGY CONSULTATION   REASON FOR CONSULTATION:  Pancreatitis.   PHYSICIAN COSIGNING NOTE:  Caro Hight, MD.   HISTORY OF PRESENT ILLNESS:  The patient is a 77 year old female with  acute onset right side abdominal pain, which began Monday afternoon.  She did not feel well at all.  She went to bed.  She had a single  episode of vomiting.  Denies any hematemesis.  She developed fever with  a temp of 103.  She saw Dr. Moshe Cipro yesterday and was noted to have a  temp of 102.  She had labs, which revealed a white blood cell count of  21,700, her hemoglobin was 11.7, her creatinine 1.24, her lipase 242,  amylase 68.  She had a CT of the abdomen and pelvis yesterday, which  revealed gallbladder distention, mild intrahepatic ductal dilatation.  Her common bile duct was 11 mm and extended anterior to a gas of soft  tissue-filled structure possibly a duodenal diverticulum.  There was  moderate edema centered at the duodenal tick and within the pancreatic  head.  There was also a calcified foci of possibly the right ovary.  She  had abdominal ultrasound today, which revealed a distended gallbladder  with without any definite stones or cholecystitis.  Her common bile duct  was 12 mm.  The portion at the level of the head of the pancreas cannot  be seen secondary to overlying gas.  Her lipase today is 411, amylase  139, LFTs normal except albumin of 3.2, potassium 3.1, creatinine is  1.39, white count is down to 15,300, hemoglobin is 10.5, MCV 86.3.   The patient states her abdominal pain is less intense, she is afebrile  currently.  She has  been taking Advil for her fever.  Her pain is in the  right mid abdomen.  She has had no further vomiting.  Her bowel  movements have been regular.  No melena or rectal bleeding.  She does  complain of heartburn for which she takes quite a bit of Tums.   HOME MEDICATIONS:  1. Albuterol p.r.n.  2. Cipro 500 mg b.i.d. started yesterday for sinusitis.  3. HCTZ 25 mg daily.  4. Lovastatin 40 mg q.h.s.  5. Synthroid 100 mcg on Monday, Wednesday, Friday, Sunday, and 150 mcg      on Tuesday, Thursday, Saturday.  6. Aspirin 81 mg daily.  7. Potassium 99 mg daily.  8. Tums p.r.n.   ALLERGIES:  No known drug allergies.   PAST MEDICAL HISTORY:  1. Hypertension.  2. Dyslipidemia.  3. Hypothyroidism.  4. COPD.  5. GERD.  6. History of hysterectomy with right oophorectomy.   FAMILY HISTORY:  Mother had metastatic  GYN cancer, a son died with  melanoma, a daughter was treated for breast cancer, doing well.   SOCIAL HISTORY:  She lives with her daughter.  Per medical report, she  quit alcohol use 20 years ago.  No history of drug use.  She has seven  children, one is deceased.  No tobacco use.   REVIEW OF SYSTEMS:  See HPI for GI.  CARDIOPULMONARY:  Denies chest pain  or shortness of breath.  GENITOURINARY:  Denies dysuria or hematuria.   PHYSICAL EXAMINATION:  VITAL SIGNS:  Temp 97.8, pulse 79, respirations  20, blood pressure 162/69, height 66 inches, weight 83.2 kg.  GENERAL:  A pleasant, well-nourished, elderly female in no acute  distress.  SKIN:  Warm and dry, no jaundice.  HEENT:  Sclerae are nonicteric, oropharyngeal mucosa moist and pink.  CHEST:  Lungs are clear to auscultation.  CARDIAC:  Regular rate and rhythm, no murmurs, rubs, or gallops.  ABDOMEN:  Positive bowel sounds, obese but symmetrical, soft.  She has  mild right mid abdominal tenderness to deep palpation, no organomegaly  or masses, no rebound tenderness or guarding, no abdominal bruits or  hernias.   EXTREMITIES:  No edema.   LABORATORY DATA:  As noted above.  In addition, her INR is 1.0, her BUN  is 20, her glucose 102, platelets 275,000, AST 24, ALT 21, total  bilirubin 1, alkaline-phosphatase 77.   IMPRESSION:  The patient is a 77 year old lady with acute onset  abdominal pain with fever most likely secondary to duodenal  diverticulitis with secondary obstructive pancreatitis and biliary  dilatation.   RECOMMENDATIONS:  1. Unasyn 3 gm IV q.6h.  2. Will follow up pending labs, and labs are planned for the morning.  3. Continue NPO status.  4. Supportive measures.  5. Recommend an outpatient colonoscopy primarily for screening      purposes.  The daughter requests that her mother have this done as      she has never had a colonoscopy. Will also need an EGD to evaluate      duodenum.   I would like to thank [facility] for allowing [place] to take part in  the care of this patient.      Neil Crouch, P.A.      Caro Hight, M.D.  Electronically Signed    LL/MEDQ  D:  05/08/2007  T:  05/08/2007  Job:  [id number]   cc:   Norwood Levo. Moshe Cipro, M.D.  Fax: [phone]

## 2011-01-10 NOTE — Op Note (Signed)
[name], [name]                 ACCOUNT NO.:  [id number]   MEDICAL RECORD NO.:  [id number]          PATIENT TYPE:  AMB   LOCATION:  DAY                           FACILITY:  [facility]   PHYSICIAN:  [name], M.D.      DATE OF BIRTH:  04-Jan-1926   DATE OF PROCEDURE:  06/24/2007  DATE OF DISCHARGE:                               OPERATIVE REPORT   REFERRING PHYSICIAN:  Bonnielee Haff, MD.   PRIMARY PHYSICIAN:  [name], M.D.   PROCEDURES:  1. Colonoscopy with snare cautery polypectomy.  2. Esophagogastroduodenoscopy with cold forceps biopsy.   FINDINGS:  1. One 6 mm sessile sigmoid colon polyp and one 8 mm sessile sigmoid      colon polyp removed via snare cautery.  2. Frequent diverticula seen in the sigmoid colon.  Colon slightly      tortuous.  3. Patent Schatzki's ring, otherwise normal esophagus without evidence      of Barrett's, mass, erosion or inflammation.  4. Patchy erythema in the prepyloric area associated with deformity of      the pylorus suggestive of prior peptic ulcer disease.  Biopsies      obtained via cold forceps to evaluate for H. pylori gastritis.  5. Unable to appreciate the duodenal diverticulum.  The duodenal bulb      and the second portion of the duodenum appeared normal.   DIAGNOSES:  1. Reviewed the CT scan again with [name] and the duodenal      diverticulum was in close proximity to the distal common bile duct      and the head of the pancreas.  Duodenal diverticulitis was the most      likely etiology for her flare of pancreatitis in light of her      normal liver enzymes, dilated common bile duct.  She is currently      asymptomatic.  2. Mild gastritis.   RECOMMENDATIONS:  1. Will call [name] with the results of her biopsies.  If she is      positive for H. pylori then we will treat.  If her polyps are      adenomatous, then she should have a screening colonoscopy within      the next 5-10 years as long as she remains  healthy.  If her polyp      is adenomatous, then her first degree relatives should begin colon      cancer screening at age 62 and then every 10 years.  2. No aspirin, NSAIDs or anticoagulation for 7 days.  3. She should follow a high fiber diet.  She is given a handout on      high-fiber diet, diverticulosis, polyps and gastritis.  She should      also avoid gastric irritants.  She is given a handout on gastric      irritants.  She may continue her omeprazole twice daily but try to      taper down the once a day.   MEDICATIONS:  1. Demerol 50 mg IV.  2. Versed 7 mg IV.  PROCEDURE TECHNIQUE:  Physical exam was performed.  Informed consent was  obtained from the patient after explaining the benefits, risks and  alternatives to the procedure.  The patient was connected to monitor and  placed in the left lateral position.  Continuous oxygen was provided by  nasal cannula and IV medicine administered through an indwelling  cannula.  After administration of sedation and rectal exam, the  patient's rectum was entered and the scope was advanced under direct  visualization to the cecum.  The scope was removed slowly by carefully  examining the color, texture, anatomy and integrity of the mucosa on the  way out.  The patient was recovered in endoscopy and discharged home in  satisfactory condition.      [name], M.D.  Electronically Signed     SM/MEDQ  D:  06/24/2007  T:  06/24/2007  Job:  [id number]   cc:   [name], M.D.  Fax: [phone]

## 2011-01-10 NOTE — Discharge Summary (Signed)
NAMEHAIVEN, MAHON                 ACCOUNT NO.:  [id number]   MEDICAL RECORD NO.:  [id number]          PATIENT TYPE:  INP   LOCATION:  [id number]                          FACILITY:  [facility]   PHYSICIAN:  Bonnielee Haff, MD     DATE OF BIRTH:  1925/10/21   DATE OF ADMISSION:  05/08/2007  DATE OF DISCHARGE:  09/12/2008LH                               DISCHARGE SUMMARY   PRIMARY MEDICAL DOCTOR:  Norwood Levo. Moshe Cipro, M.D.   Patient was seen by Dr. Caro Hight, gastroenterology.   DISCHARGE DIAGNOSES:  1. Acute pancreatitis secondary to duodenal diverticulitis, improved.  2. History of hypertension.  3. History of hypothyroidism.  4. Dyslipidemia.   BRIEF HOSPITAL COURSE:  1. Abdominal pain:  Patient is a 77 year old Caucasian female who has      medical problems, as stated above, who presented with complaints of      abdominal pain to her PMD's office.  Some initial blood work showed      an elevated lipase and CAT scan showed possible duodenal      diverticulitis and maybe pancreatitis.  The patient was sent home      on ciprofloxacin, but her symptoms worsened, and in view of the CT      findings, we were called to directly admit this patient.  The      patient was seen by gastroenterologist, Dr. Caro Hight, who felt      that the pancreatitis was secondary to the duodenal diverticulitis.      She was put on Unasyn IV, and she was kept n.p.o.  The patient      rapidly improved.  Her amylase and lipase were elevated at 139 and      411, respectively.  The lipase has come down to 101 today.  Her      LFTs remain normal.  She was also a little bit hypokalemic, but      that was corrected.  Her creatinine was slightly elevated but also      improved.   She also has anemia with a hemoglobin of 10.2 and 10.5.  MCV is normal.  Iron profile shows an iron of 23, ferritin of 143.  B12 is okay.  Hence,  this is likely anemia secondary to a chronic disease process and nothing  acute.  No evidence  for iron deficiency is noted, based on this iron  profile at this time.   Her blood pressure remains stable.  We switched her from HCTZ to Norvasc  just for this hospital stay, and she may go back to her HCTZ at home.  We do recommend she discontinue Motrin, any other NSAIDs, and aspirin  for now.  Aspirin may be started once she gets an okay from  gastroenterology.   PHYSICAL EXAMINATION:  On the day of discharge, patient is feeling quite  well.  She has tolerated a soft, bland diet very well over the past one  day.  No nausea or vomiting.  Very minimal pain.  Examination reveals that she is afebrile.  ABDOMEN:  Soft and nontender.  LABS:  As discussed above.   Considering all of the above, she is thought to be stable for discharge.   PLAN:  EGD and colonoscopy as an outpatient, which GI has arranged.  I  told the patient the importance of getting these endoscopies done to  rule out cancer, or occult lesions.  Her daughter was very keen that the  patient undergo these procedures in the hospital, but I told her there  is no real indication for these at this time.  Also, GI agreed with the  above assessment.   DISCHARGE MEDICATIONS:  1. Prilosec 20 mg p.o. b.i.d.  2. Augmentin 875 1 tablet p.o. b.i.d. for 8 more days.  3. Albuterol inhaler as needed.  4. HCTZ 25 mg daily.  5. Potassium, as before.  6. Lovastatin 40 mg daily.  7. Levothyroxine 100 mcg Monday, Wednesday, Friday, and Sunday.      Levothyroxine 150 mcg Tuesday, Thursday, Saturday.   As mentioned above, she has been told not to take her aspirin and/or  Motrin, or other pain killers at this time.  She has been told that it  will be okay for her to take Tylenol.   DIET:  As instructed by GI.  I think she would be on a soft, bland diet  for the next few days.   PHYSICAL ACTIVITY:  No restrictions.   CONSULTATIONS:  GI.   IMAGING STUDIES:  Include an ultrasound of the abdomen, which showed a  distended  gallbladder without definite gallstones or evidence of  cholecystitis.  The CBD was dilated to the pancreatic head.   CT was discussed earlier.   Chest x-ray did not show any acute abnormalities.   Total time at discharge was 40 minutes.      Bonnielee Haff, MD  Electronically Signed     GK/MEDQ  D:  05/10/2007  T:  05/10/2007  Job:  [id number]   cc:   Caro Hight, M.D.  [address]  [place] , [place] [address]   Norwood Levo. Moshe Cipro, M.D.  Fax: [phone]

## 2011-01-10 NOTE — H&P (Signed)
NAMESAMANNTHA, HOGGATT                 ACCOUNT NO.:  [id number]   MEDICAL RECORD NO.:  [id number]          PATIENT TYPE:  INP   LOCATION:  [id number]                          FACILITY:  [facility]   PHYSICIAN:  Bonnielee Haff, MD     DATE OF BIRTH:  [birth year]   DATE OF ADMISSION:  05/08/2007  DATE OF DISCHARGE:  LH                              HISTORY & PHYSICAL   PRIMARY MEDICAL DOCTOR:  Dr. Tula Nakayama   ADMITTING DIAGNOSES:  1. Acute pancreatitis, etiology unclear.  2. History of hypertension.  3. History of hypothyroidism.  4. History of dyslipidemia.   CHIEF COMPLAINT:  Abdominal pain for 2 days.   HISTORY OF PRESENT ILLNESS:  The patient is a 77 year old Caucasian  female who was in her usual state of health until this Monday when she  started noticing pain in her right upper side of her abdomen.  The pain  was radiating across to her abdomen, was 4/10 in intensity.  Worsened  with sitting up and laughing, decreasing with lying down.  Positive for  nausea and vomiting, no diarrhea.  No history of dysuria, no history of  cough.  She did have a fever with a temperature of 103 yesterday.  No  history of melena or hematochezia, no history of weight loss.   MEDICATIONS AT HOME:  1. Albuterol as needed.  2. Cipro 500 mg b.i.d. - this was started yesterday.  3. Hydrochlorothiazide 25 mg daily - she has been on this since 2005.  4. Lovastatin 40 mg q.h.s.  5. Synthroid 100 mcg on Monday, Wednesday, Friday, and Sunday, and 150      mcg on Tuesday, Thursday, Saturday.  6. Aspirin 81 mg daily.  7. Potassium daily.   ALLERGIES:  No known drug allergies.   PAST MEDICAL HISTORY:  Positive for hypertension, dyslipidemia,  hypothyroidism.  No history of diabetes or coronary artery disease.  History of hysterectomy with oophorectomy in the past.  She has a lone  ovary.   SOCIAL HISTORY:  Lives in [place] with her daughter.  No smoking.  Quit alcohol 20 years ago.  No illicit drug use.   Independent with her  daily activities.   FAMILY HISTORY:  Positive for GYN cancer in her mother.  Son had  melanoma.  Daughter had breast cancer.   REVIEW OF SYSTEMS:  GENERAL:  Review of system positive for weakness.  HEENT:  Unremarkable.  CARDIOVASCULAR:  Unremarkable.  RESPIRATORY:  Unremarkable:  GI:  As in HPI.  GU:  Unremarkable.  MUSCULOSKELETAL:  Unremarkable.  NEUROLOGIC:  Unremarkable.  DERMATOLOGIC:  Unremarkable.  PSYCHIATRIC:  Unremarkable.   PHYSICAL EXAMINATION:  VITAL SIGNS:  Temperature today is 97.8, heart  rate 79, respiratory rate 18, blood pressure 162/69, saturation 96% on  room air.  GENERAL:  This is an overweight elderly female in no distress, very  pleasant to talk to.  HEENT:  There is no pallor, no icterus.  Oral mucous membrane is normal.  No lesions are noted.  NECK:  Soft and supple.  No thyromegaly is appreciated.  CARDIOVASCULAR:  S1, S2  is normal, regular.  No murmurs appreciated.  LUNGS:  Clear to auscultation bilaterally.  ABDOMEN:  Soft.  There is tenderness in the right upper quadrant.  Murphy's sign is equivocal.  No rebound, rigidity or guarding is  present.  Bowel sounds are present.  No mass or organomegaly is  appreciated.  EXTREMITIES:  Without edema.  Peripheral pulses are palpable.  NEUROLOGIC:  The patient is alert and oriented x3.  No focal neurologic  deficits are present.   LABORATORY DATA:  White count is 15,300; hemoglobin 10.5; platelet count  275; 85% neutrophils.  She did have some blood work yesterday which was  done at her PMD's office which showed a white count of 21,000; platelet  count of 297; hemoglobin was 11.7.  Metabolic profile shows a potassium  of 3.1, creatinine 1.3, albumin 3.2, amylase 139, lipase 411.  Yesterday, amylase was normal, lipase was 242.  Coags are normal.  UA is  pending.  She did have a CAT scan yesterday which showed inflammatory  process in the descending duodenal diverticulum.  Etiology  favored  diverticulosis, pancreatitis, or duodenitis.  Mild biliary ductal  dilatation and gallbladder distension was also noted.  Etiology was  unclear.  No evidence for appendicitis.  No acute pelvic process.  There  could be a small teratoma in the right ovary.   ASSESSMENT:  This is a 77 year old Caucasian female with hypertension  who presents with abdominal pain and most likely has acute pancreatitis.  Etiology is unclear.  This could be biliary-sludge related.  No  gallstones were noted on CT.  Hydrochlorothiazide can cause pancreatitis  but she has been on this for 3 years now and this as the etiology is  very less likely.  Alcohol is also not a possibility at this time.  Blood pressure is not very well controlled at this time.   PLAN:  1. Acute pancreatitis.  Keep her n.p.o. except medications.  Hold      hydrochlorothiazide for now.  Give her IV fluids.  Consult GI.  Do      an ultrasound of the abdomen to better characterize the      gallbladder.  Await GI input.  2. Leukocytosis, etiology unclear.  She also has fever.  Could be      related to the inflammatory process.  We will empirically put her      on Levaquin for now.  3. Hypertension.  Start Norvasc, hold hydrochlorothiazide for now.  4. Hypothyroidism.  Continue her medications as before.  5. Dyslipidemia.  Continue lovastatin.  Hold aspirin, hold potassium      for now, replace it through the IV, recheck it in the morning.   Further management decisions will be based on results of initial testing  and the patient's response to treatment.      Bonnielee Haff, MD  Electronically Signed     GK/MEDQ  D:  05/08/2007  T:  05/08/2007  Job:  [id number]   cc:   Norwood Levo. Moshe Cipro, M.D.  Fax: [phone]   R. Garfield Cornea, M.D.  [address]  [place] [address]

## 2011-01-13 NOTE — Discharge Summary (Signed)
NAMERICHELLA, CARAS NO.:  [id number]   MEDICAL RECORD NO.:  [id number]                   PATIENT TYPE:  INP   LOCATION:  A340                                 FACILITY:  [facility]   PHYSICIAN:  Karlyn Agee, M.D.              DATE OF BIRTH:  Nov 26, 1925   DATE OF ADMISSION:  08/21/2003  DATE OF DISCHARGE:  08/24/2003                                 DISCHARGE SUMMARY   ADDENDUM:  This is a continuation of the history and physical on [facility]; it was prematurely interrupted.  Please collate both dictations.   HOSPITAL COURSE:  The patient had a repeat chest x-ray the following morning  which showed a right upper and right lower lobe pneumonia.  Also suggestive  of multiple pulmonary nodules. It was recommended that a repeat CT be done  if these nodules did not clear on antibiotics.  On follow up x-ray today,  the patient shows improvement in the right upper and lower lobe infiltrates.  She is afebrile and her white count is now normal from 15 on admission.   On admission the patient was found to have a mildly elevated blood glucose  on Chem-7.  She gave no history of polyuria, polydipsia, or weight loss; but  a fasting blood sugar the following morning was 125 and the patient has had  regular Accu-Checks since then and has been found to fit the criteria  diabetes.   Dictation ended at this point.     ___________________________________________                                         Karlyn Agee, M.D.   LC/MEDQ  D:  08/24/2003  T:  08/24/2003  Job:  [id number]

## 2011-01-13 NOTE — Discharge Summary (Signed)
NAMEALYSSAH, WARE                             ACCOUNT NO.:  [id number]   MEDICAL RECORD NO.:  [id number]                   PATIENT TYPE:  INP   LOCATION:  A340                                 FACILITY:  [facility]   PHYSICIAN:  Karlyn Agee, M.D.              DATE OF BIRTH:  08/30/1925   DATE OF ADMISSION:  08/21/2003  DATE OF DISCHARGE:  08/24/2003                                 DISCHARGE SUMMARY   ADDENDUM:   HOSPITAL COURSE:  Fasting blood sugars, two consecutive, are found to be 125  and 139.  She was diagnosed with new onset diabetes, very mild, so an  attempt is being made to control her diet.  A TSH done was found to be  elevated at 11.4.  She has been recommended for followup with Dr. Moshe Cipro  for repeat and to start treatment for hypothyroidism, as it remains  elevated.  Hemoglobin A1C was 5.1.  A lipid panel is ordered, but the  results are pending.   FOLLOW UP:  1. With Dr. Tula Nakayama within a week.  Patient is to start home health     care assistance with blood glucose monitoring and dietary management.  2. Patient has been advised that she may need to have a CT scan of the chest     if a repeat chest x-ray shows persistence of pulmonary nodules in another     two weeks time.     ___________________________________________                                         Karlyn Agee, M.D.   LC/MEDQ  D:  08/24/2003  T:  08/24/2003  Job:  [id number]

## 2011-01-13 NOTE — Discharge Summary (Signed)
NAMELILLYE, [name] NO.:  [id number]   MEDICAL RECORD NO.:  [id number]                   PATIENT TYPE:  INP   LOCATION:  A340                                 FACILITY:  [facility]   PHYSICIAN:  Karlyn Agee, M.D.              DATE OF BIRTH:  1925/09/05   DATE OF ADMISSION:  08/21/2003  DATE OF DISCHARGE:                                 DISCHARGE SUMMARY   PRIMARY CARE PHYSICIAN:  Dr. Tula Nakayama.   DISCHARGE DIAGNOSES:  1. Right upper and right lower lobe pneumonia.  2. Pansinusitis, acute and chronic.  3. History of chronic obstructive pulmonary disease/asthma.  4. Rule out multiple lung nodules versus bronchopneumonia.  5. Newly discovered diabetes mellitus type 2 controlled on diet.  6. Elevated TSH, rule out newly discovered hypothyroidism.   DISPOSITION:  Discharged to home.   DISCHARGE CONDITION:  Stable.   DISCHARGE MEDICATIONS:  1. Levaquin 500 mg p.o. daily.  2. Flonase 1 spray both nostrils daily.  3. Protonix 40 mg daily.  4. Verapamil 120 mg daily.  5. Combivent inhaler twice daily.   HOSPITAL COURSE:  Please refer to history and physical of December 24.  This  is a 77 year old Caucasian lady who usually gets her health care by using  [facility] and as such has a history of chronic sinusitis,  asthma, recurrent episodes of pneumonia, who came in with a history of a  sinus attack for the past 2-3 days, with facial pain, headaches, no relief  from over-the-counter medication.  The day prior to admission she developed  yellow, productive cough.  The morning of admission she had a presyncopal  episode in her bathroom and her daughter found her very confused and  disoriented, and she was brought to the emergency room where with IV fluids  she quickly became oriented and felt ready to go home; however  investigations there found that she had a significant white count and CT of  the head revealed pansinusitis. She also  had a temperature of 103.  Physical  examination at that time was very suggestive of a lower lobe pneumonia.  The  patient was admitted, started on Rocephin, Levaquin and albuterol and  Atrovent inhalers and hydration.  Chest x-ray the following day revealed  right upper and lower lobe pneumonia.    ___________________________________________                                         Karlyn Agee, M.D.   LC/MEDQ  D:  08/24/2003  T:  08/24/2003  Job:  [id number]

## 2011-01-13 NOTE — H&P (Signed)
NAMEMAGNOLIA, [name] NO.:  [id number]   MEDICAL RECORD NO.:  [id number]                   PATIENT TYPE:  EMS   LOCATION:  ED                                   FACILITY:  [facility]   PHYSICIAN:  Karlyn Agee, M.D.              DATE OF BIRTH:  [birth year]   DATE OF ADMISSION:  08/21/2003  DATE OF DISCHARGE:                                HISTORY & PHYSICAL   PRIMARY CARE PHYSICIAN:  Dr. Suzi Roots.   CHIEF COMPLAINT:  Dizziness and confusion since today.   HISTORY OF PRESENT ILLNESS:  This is a 77 year old Caucasian lady with a  history of asthma, sinusitis, and recurrent episodes of pneumonia who has  been having an attack of sinusitis for the past two to three days with  facial pain, headache, chills, and has not had any relief from over-the-  counter medications.  Yesterday evening she developed cough productive of  yellow sputum.  This morning she had a presyncopal episode in the bathroom,  became very confused and disoriented according to her daughter, and was  brought to the emergency room.  In the emergency room she received IV fluids  and then became improved and reoriented to time.  She has had, as we said,  fever, chills, dizziness and confusion, and facial pain.  She denies  orthopnea, PND, but does have occasional leg edema.  She denies nausea,  vomiting, diarrhea, or constipation.   PAST MEDICAL HISTORY:  Significant for hypertension, asthma, allergies.  She  is status post hysterectomy.  She has recurrent sinusitis, recurrent  episodes of pneumonia.  She received Pneumovax one week ago.   MEDICATIONS:  1. Advair 500/50.  2. Albuterol inhaler when necessary.  3. Verapamil 120 mg daily.   ALLERGIES:  She says she is allergic to an unknown medication.   SOCIAL AND FAMILY HISTORY:  She denies tobacco, alcohol, or drugs.  She  lives with her daughter and functions independently.   REVIEW OF SYSTEMS:  Positive for stress incontinence  and nocturia and  occasional swelling of her feet.   PHYSICAL EXAMINATION:  GENERAL:  This is an elderly, mildly obese Caucasian  lady lying on the stretcher, mildly dehydrated, dry cracked lips.  VITAL SIGNS:  Temperature is 101.5, her Tmax is 103.4, her blood pressure is  131/58, her pulse is 76 - it was 99 on admission, her respiration is 28.  HEENT:  She is pink and anicteric, no lymphadenopathy.  Mildly dehydrated.  Her pupils are equal and reactive.  She is tender over the frontal and  maxillary sinuses.  CHEST:  Decreased breath sounds in the right lower lobe, no wheezing.  CARDIOVASCULAR:  Regular rhythm, no murmurs.  ABDOMEN:  Obese, soft, nontender.  EXTREMITIES:  There is a trace of edema, 3+ pulses bilaterally.  NEUROLOGIC:  She is alert and oriented x3 and no focal deficit.   LABORATORY  DATA:  Hemoglobin 13.5, hematocrit 40, her MCV is 87, her RDW is  12, her white count is 15.9 with 91% neutrophils, her platelets are 274.  Her sodium is 137, her potassium 4.1, chloride 103, CO2 28, her BUN is 10  and her creatinine is 0.9, her glucose is 131, calcium 9.2, total protein  7.3, albumin 3.6, total bili 0.6, alk phos 85, AST 24, ALT 23.  Her alcohol  level is less than 5.  Urinalysis is specific gravity 1.020, it is otherwise  bland.  ABG:  Her pH is 7.487, her PCO2 is 29, her PO2 is 68.  CT of the  head shows no acute changes in the brain.  She has pansinusitis with air-  fluid levels.   ASSESSMENT:  Acute-on-chronic pansinusitis, rule out pneumonia although the  chest x-ray does not show an infiltrate.  Physical exam is abnormal and she  is dehydrated.   PLAN:  We will admit her with Rocephin and Levaquin.  I will repeat her  chest x-ray in the morning.  We will hydrate her.  If she is improved in the  morning we will send her home.     ___________________________________________                                         Karlyn Agee, M.D.   LC/MEDQ  D:  08/21/2003   T:  08/21/2003  Job:  [id number]

## 2011-01-16 ENCOUNTER — Ambulatory Visit (INDEPENDENT_AMBULATORY_CARE_PROVIDER_SITE_OTHER): Payer: Medicare Other | Admitting: *Deleted

## 2011-01-16 DIAGNOSIS — Z7901 Long term (current) use of anticoagulants: Secondary | ICD-10-CM

## 2011-01-16 DIAGNOSIS — I4891 Unspecified atrial fibrillation: Secondary | ICD-10-CM

## 2011-02-01 ENCOUNTER — Ambulatory Visit (INDEPENDENT_AMBULATORY_CARE_PROVIDER_SITE_OTHER): Payer: Self-pay | Admitting: *Deleted

## 2011-02-01 DIAGNOSIS — I4891 Unspecified atrial fibrillation: Secondary | ICD-10-CM

## 2011-02-01 DIAGNOSIS — Z7901 Long term (current) use of anticoagulants: Secondary | ICD-10-CM

## 2011-02-01 LAB — POCT INR: INR: 2.4

## 2011-02-14 ENCOUNTER — Ambulatory Visit (INDEPENDENT_AMBULATORY_CARE_PROVIDER_SITE_OTHER): Payer: Medicare Other | Admitting: Orthopedic Surgery

## 2011-02-14 DIAGNOSIS — S82009A Unspecified fracture of unspecified patella, initial encounter for closed fracture: Secondary | ICD-10-CM

## 2011-02-14 NOTE — Progress Notes (Signed)
LEFT patella fracture.  Followup visit.  Status post physical therapy.  Patient is improved. Her range of motion her pain is decreased. She ambulates with a walker outside of the house and unsupported inside the house. She demonstrates good extension, power normal knee flexion. No tenderness at the fracture site.  Impression resolved patella fracture.  Progressive weightbearing as tolerated and activities as tolerated and follow up as needed

## 2011-02-16 ENCOUNTER — Other Ambulatory Visit: Payer: Self-pay | Admitting: Family Medicine

## 2011-02-16 LAB — LIPID PANEL
HDL: 34 mg/dL — ABNORMAL LOW (ref >39)
LDL Cholesterol: 59 mg/dL (ref 0–99)
Total CHOL/HDL Ratio: 4.1 Ratio
Triglycerides: 235 mg/dL — ABNORMAL HIGH (ref <150)

## 2011-02-16 LAB — TSH: TSH: 0.85 u[IU]/mL (ref 0.350–4.500)

## 2011-02-16 LAB — BASIC METABOLIC PANEL
CO2: 28 mEq/L (ref 19–32)
Chloride: 101 mEq/L (ref 96–112)
Creat: 0.87 mg/dL (ref 0.50–1.10)
Sodium: 142 mEq/L (ref 135–145)

## 2011-02-16 LAB — CBC WITH DIFFERENTIAL/PLATELET
Basophils Absolute: 0.1 10*3/uL (ref 0.0–0.1)
Eosinophils Relative: 2 % (ref 0–5)
Lymphocytes Relative: 29 % (ref 12–46)
Lymphs Abs: 2.7 10*3/uL (ref 0.7–4.0)
Neutro Abs: 5.6 10*3/uL (ref 1.7–7.7)
Neutrophils Relative %: 61 % (ref 43–77)
Platelets: 331 10*3/uL (ref 150–400)
RBC: 4.09 MIL/uL (ref 3.87–5.11)
RDW: 15 % (ref 11.5–15.5)
WBC: 9.3 10*3/uL (ref 4.0–10.5)

## 2011-02-16 LAB — HEPATIC FUNCTION PANEL
AST: 18 U/L (ref 0–37)
Albumin: 3.9 g/dL (ref 3.5–5.2)
Alkaline Phosphatase: 89 U/L (ref 39–117)
Total Bilirubin: 0.4 mg/dL (ref 0.3–1.2)
Total Protein: 6.9 g/dL (ref 6.0–8.3)

## 2011-02-20 ENCOUNTER — Encounter: Payer: Self-pay | Admitting: Family Medicine

## 2011-02-20 LAB — HEMOGLOBIN A1C: Mean Plasma Glucose: 126 mg/dL — ABNORMAL HIGH (ref <117)

## 2011-02-21 ENCOUNTER — Encounter: Payer: Self-pay | Admitting: Family Medicine

## 2011-02-21 ENCOUNTER — Ambulatory Visit (INDEPENDENT_AMBULATORY_CARE_PROVIDER_SITE_OTHER): Payer: Medicare Other | Admitting: Family Medicine

## 2011-02-21 VITALS — BP 134/64 | HR 58 | Resp 16 | Ht 65.25 in | Wt 172.0 lb

## 2011-02-21 DIAGNOSIS — E739 Lactose intolerance, unspecified: Secondary | ICD-10-CM

## 2011-02-21 DIAGNOSIS — E039 Hypothyroidism, unspecified: Secondary | ICD-10-CM

## 2011-02-21 DIAGNOSIS — I10 Essential (primary) hypertension: Secondary | ICD-10-CM

## 2011-02-21 DIAGNOSIS — E785 Hyperlipidemia, unspecified: Secondary | ICD-10-CM

## 2011-02-21 DIAGNOSIS — R7301 Impaired fasting glucose: Secondary | ICD-10-CM

## 2011-02-21 NOTE — Patient Instructions (Addendum)
F/u in 4 months   Lipid, hepatic , chem 7,tSH and HBA1c fasting 4 months   pls cut back on cheese, , butter and cookies , sweets, your blood sugars are running too high, and your triglycerides

## 2011-02-22 ENCOUNTER — Ambulatory Visit (INDEPENDENT_AMBULATORY_CARE_PROVIDER_SITE_OTHER): Payer: Medicare Other | Admitting: *Deleted

## 2011-02-22 DIAGNOSIS — Z7901 Long term (current) use of anticoagulants: Secondary | ICD-10-CM

## 2011-02-22 DIAGNOSIS — I4891 Unspecified atrial fibrillation: Secondary | ICD-10-CM

## 2011-02-22 LAB — POCT INR: INR: 2.2

## 2011-02-27 NOTE — Assessment & Plan Note (Signed)
Controlled, no change in medication  

## 2011-02-27 NOTE — Assessment & Plan Note (Addendum)
Low carb diet discussed and encouraged to reduce the risk of becoming diabetic

## 2011-02-27 NOTE — Assessment & Plan Note (Addendum)
Uncontrolled. No changes in medication at this time. Pt advised to reduce fried and fatty foods due to elevated TG and increase activity to raise HDL

## 2011-02-27 NOTE — Progress Notes (Signed)
  Subjective:    Patient ID: [name], female    DOB: 12-29-1925, 77 y.o.   MRN: [id number]  HPI The PT is here for follow up and re-evaluation of chronic medical conditions, medication management and review of recent lab and radiology data.  Preventive health is updated, specifically  Cancer screening, Osteoporosis screening and Immunization.   Questions or concerns regarding consultations or procedures which the PT has had in the interim are  addressed. The PT denies any adverse reactions to current medications since the last visit.  There are no new concerns.  There are no specific complaints       Review of Systems Denies recent fever or chills. Denies sinus pressure, nasal congestion, ear pain or sore throat. Denies chest congestion, productive cough or wheezing. Denies chest pains, palpitations, paroxysmal nocturnal dyspnea, orthopnea and leg swelling Denies abdominal pain, nausea, vomiting,diarrhea or constipation.  Denies rectal bleeding or change in bowel movement. Denies dysuria, frequency, hesitancy or incontinence. Denies headaches, seizure, numbness, or tingling. Denies depression, anxiety or insomnia. Denies skin break down or rash.        Objective:   Physical Exam Patient alert and oriented and in no Cardiopulmonary distress.  HEENT: No facial asymmetry, EOMI, no sinus tenderness, TM's clear, Oropharynx pink and moist.  Neck supple no adenopathy.  Chest: Clear to auscultation bilaterally.  CVS: S1, S2 no murmurs, no S3.  ABD: Soft non tender. Bowel sounds normal.  Ext: No edema  MS: decreased  ROM spine adequate in , shoulders, hips and knees.  Skin: Intact, no ulcerations or rash noted.  Psych: Good eye contact, normal affect. Memory  Mildly impaired not anxious or depressed appearing.  CNS: CN 2-12 intact, power, tone and sensation normal throughout.        Assessment & Plan:

## 2011-03-22 ENCOUNTER — Ambulatory Visit (INDEPENDENT_AMBULATORY_CARE_PROVIDER_SITE_OTHER): Payer: Medicare Other | Admitting: *Deleted

## 2011-03-22 DIAGNOSIS — I4891 Unspecified atrial fibrillation: Secondary | ICD-10-CM

## 2011-03-22 DIAGNOSIS — Z7901 Long term (current) use of anticoagulants: Secondary | ICD-10-CM

## 2011-03-27 ENCOUNTER — Other Ambulatory Visit: Payer: Self-pay | Admitting: Cardiovascular Disease

## 2011-03-27 ENCOUNTER — Other Ambulatory Visit: Payer: Self-pay | Admitting: Family Medicine

## 2011-04-05 ENCOUNTER — Ambulatory Visit (INDEPENDENT_AMBULATORY_CARE_PROVIDER_SITE_OTHER): Payer: Medicare Other | Admitting: Ophthalmology

## 2011-04-05 DIAGNOSIS — H3530 Unspecified macular degeneration: Secondary | ICD-10-CM

## 2011-04-19 ENCOUNTER — Ambulatory Visit (INDEPENDENT_AMBULATORY_CARE_PROVIDER_SITE_OTHER): Payer: Medicare Other | Admitting: *Deleted

## 2011-04-19 DIAGNOSIS — I4891 Unspecified atrial fibrillation: Secondary | ICD-10-CM

## 2011-04-19 DIAGNOSIS — Z7901 Long term (current) use of anticoagulants: Secondary | ICD-10-CM

## 2011-04-20 ENCOUNTER — Ambulatory Visit (INDEPENDENT_AMBULATORY_CARE_PROVIDER_SITE_OTHER): Payer: Medicare Other | Admitting: Family Medicine

## 2011-04-20 ENCOUNTER — Encounter: Payer: Self-pay | Admitting: Family Medicine

## 2011-04-20 VITALS — BP 160/62 | HR 58 | Resp 16 | Ht 65.0 in | Wt 174.4 lb

## 2011-04-20 DIAGNOSIS — J309 Allergic rhinitis, unspecified: Secondary | ICD-10-CM

## 2011-04-20 DIAGNOSIS — E039 Hypothyroidism, unspecified: Secondary | ICD-10-CM

## 2011-04-20 DIAGNOSIS — J329 Chronic sinusitis, unspecified: Secondary | ICD-10-CM

## 2011-04-20 DIAGNOSIS — I10 Essential (primary) hypertension: Secondary | ICD-10-CM

## 2011-04-20 DIAGNOSIS — E785 Hyperlipidemia, unspecified: Secondary | ICD-10-CM

## 2011-04-20 MED ORDER — METOPROLOL TARTRATE 100 MG PO TABS: 100.0000 mg | ORAL_TABLET | Freq: Two times a day (BID) | ORAL | Status: DC

## 2011-04-20 MED ORDER — FLUTICASONE PROPIONATE 50 MCG/ACT NA SUSP: 1.0000 | Freq: Every day | NASAL | Status: DC

## 2011-04-20 MED ORDER — HYDROCHLOROTHIAZIDE 25 MG PO TABS: 25.0000 mg | ORAL_TABLET | Freq: Every day | ORAL | Status: DC

## 2011-04-20 NOTE — Patient Instructions (Addendum)
F/u as before  You have sinusitis, you need to take the antibiotics as prescribed.  Medication for allergic rhinitis  Is sent for your allergies  LABWORK  NEEDS TO BE DONE BETWEEN 3 TO 7 DAYS BEFORE YOUR NEXT SCEDULED  VISIT.  THIS WILL IMPROVE THE QUALITY OF YOUR CARE.  pls make appt for flu vacine

## 2011-04-23 NOTE — Progress Notes (Signed)
  Subjective:    Patient ID: [name], female    DOB: [birth year], 77 y.o.   MRN: [id number]  HPI Pt c/o 1 week h/o increased and uncontrolled allergy symptoms, involving excessive clear nasal drainage , sneezing and cough. This is not uncommon at this time of the year. 1 week ago , she was prescribed antibiotics by her pulmonary Doc, now realizes that  She has been under dosing and not taking as prescribed, as a result she is not much better. Still has sinus pressure and green drainage.Occasional chills but no documented fever   Review of Systems See HPI Denies chest pains, palpitations and leg swelling Denies abdominal pain, nausea, vomiting,diarrhea or constipation.   Denies dysuria, frequency, hesitancy or incontinence. Chronic joint pain and limitation in mobility. Denies  seizures, numbness, or tingling. Denies depression, anxiety or insomnia. Denies skin break down or rash.        Objective:   Physical Exam Patient alert and oriented and in no cardiopulmonary distress.  HEENT: No facial asymmetry, EOMI, frontal and maxillary  sinus tenderness,  oropharynx pink and moist.  Neck supple no adenopathy. Erythema and edema of nasal mucosa  Chest: Clear to auscultation bilaterally.  CVS: S1, S2 no murmurs, no S3.  ABD: Soft non tender. Bowel sounds normal.  Ext: No edema  MS: Adequate ROM spine, shoulders, hips and knees.  Skin: Intact, no ulcerations or rash noted.  Psych: Good eye contact, normal affect. Memory intact not anxious or depressed appearing.  CNS: CN 2-12 intact, power, tone and sensation normal throughout.        Assessment & Plan:

## 2011-04-23 NOTE — Assessment & Plan Note (Signed)
Elevated tG , advised to reduce cheese, butte and fried foods, no med changes

## 2011-04-23 NOTE — Assessment & Plan Note (Addendum)
Uncontrolled. Medication compliance addressed. Commitment to regular exercise, and healthy  eating habits with portion control discussed. DASH diet, and low fat diet discussed, and literature offered. No changes in medication at this time.  

## 2011-04-23 NOTE — Assessment & Plan Note (Signed)
Controlled, no change in medication  

## 2011-04-23 NOTE — Assessment & Plan Note (Signed)
Uncontrolled , med prescribed 

## 2011-05-10 ENCOUNTER — Ambulatory Visit (INDEPENDENT_AMBULATORY_CARE_PROVIDER_SITE_OTHER): Payer: Medicare Other

## 2011-05-10 DIAGNOSIS — Z23 Encounter for immunization: Secondary | ICD-10-CM

## 2011-05-10 MED ORDER — INFLUENZA VAC TYPES A & B PF IM SUSP: 0.5000 mL | Freq: Once | INTRAMUSCULAR | Status: DC

## 2011-05-15 IMAGING — CR DG CHEST 1V PORT
1 series · 1 of 1 positions shown · non-contrast
Comparison: 05/07/2007

CLINICAL DATA: Chest pain, tachycardia.

PORTABLE CHEST - 1 VIEW

[view not recorded]
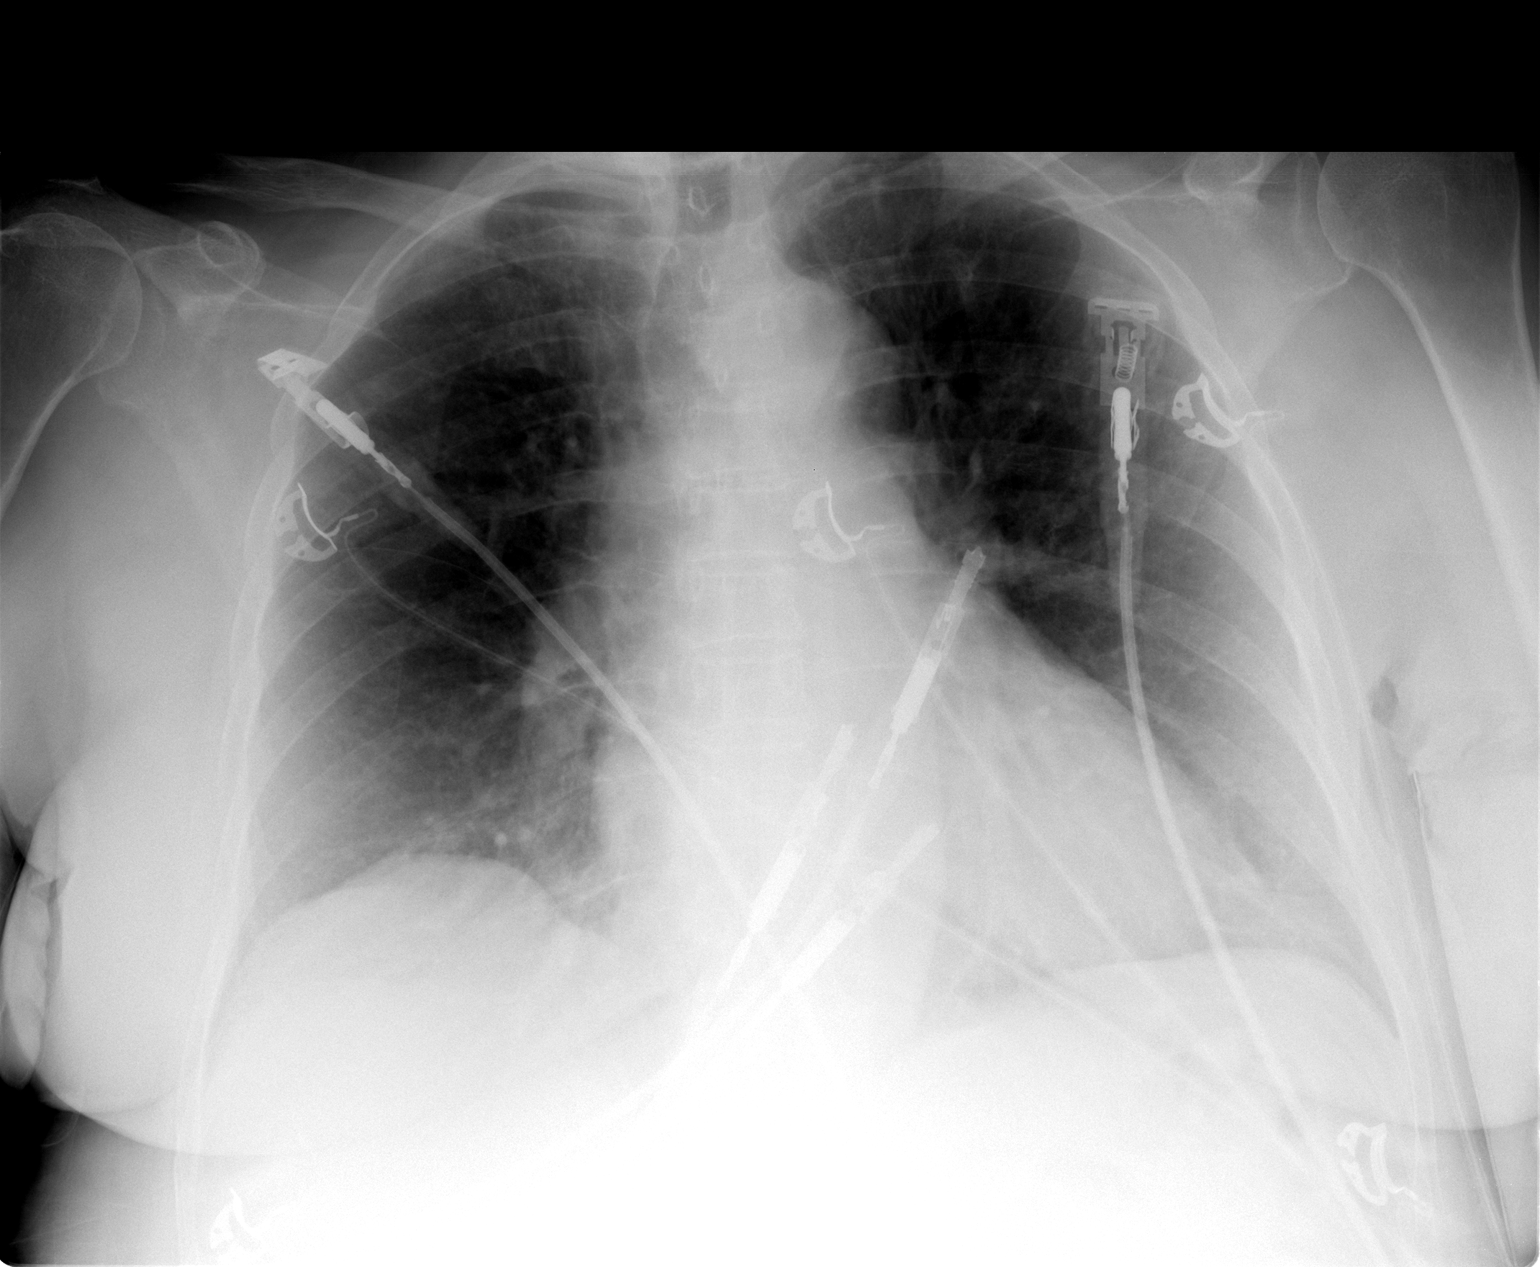

[1 of 1 positions shown; findings below may reference images not displayed]

FINDINGS: Heart is borderline in size.  Lungs are clear.  No
effusions or edema.  No acute bony abnormality.
IMPRESSION: No acute cardiopulmonary disease.

## 2011-05-17 ENCOUNTER — Encounter: Payer: Self-pay | Admitting: *Deleted

## 2011-05-22 ENCOUNTER — Ambulatory Visit (INDEPENDENT_AMBULATORY_CARE_PROVIDER_SITE_OTHER): Payer: Medicare Other | Admitting: *Deleted

## 2011-05-22 DIAGNOSIS — Z7901 Long term (current) use of anticoagulants: Secondary | ICD-10-CM

## 2011-05-22 DIAGNOSIS — I4891 Unspecified atrial fibrillation: Secondary | ICD-10-CM

## 2011-05-22 LAB — POCT INR: INR: 3

## 2011-05-31 ENCOUNTER — Other Ambulatory Visit: Payer: Self-pay | Admitting: Adult Health

## 2011-06-07 ENCOUNTER — Telehealth: Payer: Self-pay | Admitting: Family Medicine

## 2011-06-09 LAB — COMPREHENSIVE METABOLIC PANEL
AST: 14
AST: 24
Alkaline Phosphatase: 67
BUN: 14
CO2: 27
CO2: 28
CO2: 29
Chloride: 100
Chloride: 105
Chloride: 107
Creatinine, Ser: 1.1
Creatinine, Ser: 1.18
Creatinine, Ser: 1.39 — ABNORMAL HIGH
GFR calc Af Amer: 44 — ABNORMAL LOW
GFR calc Af Amer: 58 — ABNORMAL LOW
GFR calc non Af Amer: 37 — ABNORMAL LOW
GFR calc non Af Amer: 44 — ABNORMAL LOW
GFR calc non Af Amer: 48 — ABNORMAL LOW
Potassium: 4
Total Bilirubin: 0.7
Total Bilirubin: 0.9
Total Bilirubin: 1

## 2011-06-09 LAB — CBC
HCT: 29.4 — ABNORMAL LOW
HCT: 30.3 — ABNORMAL LOW
Hemoglobin: 10.2 — ABNORMAL LOW
MCV: 86.3
MCV: 86.3
RBC: 3.41 — ABNORMAL LOW
RBC: 3.51 — ABNORMAL LOW
WBC: 15.3 — ABNORMAL HIGH
WBC: 8.3

## 2011-06-09 LAB — DIFFERENTIAL
Basophils Absolute: 0
Basophils Absolute: 0.1
Basophils Relative: 0
Basophils Relative: 1
Eosinophils Absolute: 0
Eosinophils Relative: 0
Eosinophils Relative: 1
Lymphocytes Relative: 21
Lymphocytes Relative: 7 — ABNORMAL LOW
Neutro Abs: 5.8

## 2011-06-09 LAB — IRON AND TIBC
Iron: 23 — ABNORMAL LOW
Saturation Ratios: 9 — ABNORMAL LOW
UIBC: 228

## 2011-06-09 LAB — LIPASE, BLOOD: Lipase: 411 — ABNORMAL HIGH

## 2011-06-09 LAB — APTT: aPTT: 30

## 2011-06-09 LAB — CREATININE, SERUM
Creatinine, Ser: 1.23 — ABNORMAL HIGH
GFR calc non Af Amer: 42 — ABNORMAL LOW

## 2011-06-09 NOTE — Telephone Encounter (Signed)
Advise use of robitussin dm , no fever ior chills note , drink a lot of fluids, if worsens call back for appt

## 2011-06-14 NOTE — Telephone Encounter (Signed)
Patient feels better, daughter aware to call for appt if patient develops fever or chills

## 2011-06-16 ENCOUNTER — Encounter: Payer: Self-pay | Admitting: Cardiology

## 2011-06-19 ENCOUNTER — Ambulatory Visit (INDEPENDENT_AMBULATORY_CARE_PROVIDER_SITE_OTHER): Payer: Medicare Other | Admitting: *Deleted

## 2011-06-19 DIAGNOSIS — Z7901 Long term (current) use of anticoagulants: Secondary | ICD-10-CM

## 2011-06-19 DIAGNOSIS — I4891 Unspecified atrial fibrillation: Secondary | ICD-10-CM

## 2011-06-19 LAB — POCT INR: INR: 2.6

## 2011-06-22 ENCOUNTER — Encounter: Payer: Self-pay | Admitting: Family Medicine

## 2011-06-22 LAB — BASIC METABOLIC PANEL
BUN: 18 mg/dL (ref 6–23)
CO2: 28 mEq/L (ref 19–32)
Chloride: 103 mEq/L (ref 96–112)
Glucose, Bld: 110 mg/dL — ABNORMAL HIGH (ref 70–99)
Potassium: 4.2 mEq/L (ref 3.5–5.3)
Sodium: 142 mEq/L (ref 135–145)

## 2011-06-22 LAB — HEPATIC FUNCTION PANEL
Bilirubin, Direct: 0.1 mg/dL (ref 0.0–0.3)
Indirect Bilirubin: 0.4 mg/dL (ref 0.0–0.9)
Total Bilirubin: 0.5 mg/dL (ref 0.3–1.2)

## 2011-06-22 LAB — LIPID PANEL
Total CHOL/HDL Ratio: 4.9 Ratio
VLDL: 62 mg/dL — ABNORMAL HIGH (ref 0–40)

## 2011-06-22 LAB — HEMOGLOBIN A1C: Hgb A1c MFr Bld: 5.9 % — ABNORMAL HIGH (ref <5.7)

## 2011-06-23 ENCOUNTER — Encounter: Payer: Self-pay | Admitting: Cardiology

## 2011-06-23 ENCOUNTER — Ambulatory Visit (INDEPENDENT_AMBULATORY_CARE_PROVIDER_SITE_OTHER): Payer: Medicare Other | Admitting: Cardiology

## 2011-06-23 DIAGNOSIS — Z7901 Long term (current) use of anticoagulants: Secondary | ICD-10-CM

## 2011-06-23 DIAGNOSIS — I10 Essential (primary) hypertension: Secondary | ICD-10-CM

## 2011-06-23 DIAGNOSIS — E039 Hypothyroidism, unspecified: Secondary | ICD-10-CM

## 2011-06-23 DIAGNOSIS — R002 Palpitations: Secondary | ICD-10-CM

## 2011-06-23 DIAGNOSIS — R7301 Impaired fasting glucose: Secondary | ICD-10-CM

## 2011-06-23 DIAGNOSIS — G4730 Sleep apnea, unspecified: Secondary | ICD-10-CM

## 2011-06-23 DIAGNOSIS — I495 Sick sinus syndrome: Secondary | ICD-10-CM

## 2011-06-23 DIAGNOSIS — J309 Allergic rhinitis, unspecified: Secondary | ICD-10-CM

## 2011-06-23 DIAGNOSIS — F411 Generalized anxiety disorder: Secondary | ICD-10-CM

## 2011-06-23 DIAGNOSIS — Z950 Presence of cardiac pacemaker: Secondary | ICD-10-CM

## 2011-06-23 DIAGNOSIS — E785 Hyperlipidemia, unspecified: Secondary | ICD-10-CM

## 2011-06-23 NOTE — Assessment & Plan Note (Signed)
A repeat CBC will be obtained as well as stool for Hemoccult testing to exclude occult GI blood loss.

## 2011-06-23 NOTE — Assessment & Plan Note (Signed)
Blood pressure control is good with current medication, which will be continued. 

## 2011-06-23 NOTE — Patient Instructions (Signed)
Your physician recommends that you schedule a follow-up appointment in: 1 year with Dr Lattie Haw.  You will receive a reminder letter.

## 2011-06-23 NOTE — Progress Notes (Signed)
HPI : [name] returns to the office as scheduled for continued assessment and treatment of sick sinus syndrome, hypertension and hyperlipidemia.  Since her last visit, she has done quite well.  Palpitations have resolved.  She has experienced no lightheadedness, syncope, chest discomfort or dyspnea.  No new significant medical problems have developed.  She has been told of fasting hyperglycemia, but has been reluctant to adopt any significant dietary modifications.  Current Outpatient Prescriptions on File Prior to Visit  Medication Sig Dispense Refill  . Calcium Carbonate (CALCIUM 600 PO) Take 1 tablet by mouth.        . cholecalciferol (VITAMIN D) 1000 UNIT tablet Take 1,000 Units by mouth daily.        . digoxin (LANOXIN) 0.125 MG tablet TAKE 1 TABLET BY MOUTH   ONCE A DAY.  30 tablet  6  . fluticasone (FLONASE) 50 MCG/ACT nasal spray Place 1 spray into the nose daily.  16 g  2  . hydrochlorothiazide 25 MG tablet Take 1 tablet (25 mg total) by mouth daily. Take one tablet by mouth once daily  30 tablet  3  . metoprolol (LOPRESSOR) 100 MG tablet Take 1 tablet (100 mg total) by mouth 2 (two) times daily. Take one tablet two times daily  60 tablet  3  . MEVACOR 40 MG tablet TAKE 2 TABLETS BY MOUTH  AT BEDTIME.  60 each  2  . omeprazole (PRILOSEC) 20 MG capsule TAKE (1) CAPSULE BY MOUTHONCE DAILY FOR ACIDREFLUX.  30 capsule  2  . Potassium 99 MG TABS Take by mouth daily.        Marland Kitchen SYNTHROID 100 MCG tablet TAKE 1 TABLET BY MOUTH   MONDAY THROUGH FRIDAY ANDTAKE (1 AND 1/2) TABLET  ON SATURDAY AND SUNDAY AS  40 each  2  . vitamin E (VITAMIN E) 1000 UNIT capsule Take 1,000 Units by mouth daily.        Marland Kitchen warfarin (COUMADIN) 5 MG tablet TAKE AS DIRECTED.  30 tablet  3   Current Facility-Administered Medications on File Prior to Visit  Medication Dose Route Frequency Provider Last Rate Last Dose  . Influenza (>/= 3 years) inactive virus vaccine (FLVIRIN/FLUZONE) injection SUSP 0.5 mL  0.5 mL  Intramuscular Once Tula Nakayama, MD         No Known Allergies    Past medical history, social history, and family history reviewed and updated.  ROS: See history of present illness.  PHYSICAL EXAM: BP 137/68  Pulse 59  Resp 16  Ht 5\' 6"  (1.676 m)  Wt 175 lb (79.379 kg)  BMI 28.25 kg/m2; weight has decreased 9 pounds since 2011 General-Well developed; no acute distress Body habitus- overweight Neck-No JVD; no carotid bruits Lungs-clear lung fields; resonant to percussion; minimal systolic ejection murmur Cardiovascular-normal PMI; normal S1 and S2 Abdomen-normal bowel sounds; soft and non-tender without masses or organomegaly Musculoskeletal-No deformities, no cyanosis or clubbing Neurologic-Normal cranial nerves; symmetric strength and tone Skin-Warm, no significant lesions Extremities-distal pulses intact; no edema  EKG: Electronic atrial pacing; minimal nonspecific ST segment abnormality; no previous tracing for comparison.  ASSESSMENT AND PLAN:

## 2011-06-23 NOTE — Assessment & Plan Note (Signed)
Patient was last seen 6 months ago for assessment of her pacemaker, which was functioning normally.  We will verify that she is to be seen again within the next 6 months.

## 2011-06-23 NOTE — Assessment & Plan Note (Signed)
Lipid profile is acceptable, as patient has no known vascular disease.  Current therapy can be continued.

## 2011-06-23 NOTE — Assessment & Plan Note (Signed)
Patient is euthyroid based upon recent TSH value.

## 2011-06-23 NOTE — Assessment & Plan Note (Signed)
Based upon a normal hemoglobin A1c level, glucose intolerance is mild.

## 2011-06-26 ENCOUNTER — Ambulatory Visit (INDEPENDENT_AMBULATORY_CARE_PROVIDER_SITE_OTHER): Payer: Medicare Other | Admitting: Family Medicine

## 2011-06-26 ENCOUNTER — Other Ambulatory Visit: Payer: Self-pay | Admitting: Family Medicine

## 2011-06-26 ENCOUNTER — Encounter: Payer: Self-pay | Admitting: Family Medicine

## 2011-06-26 VITALS — BP 120/62 | HR 66 | Resp 16 | Ht 65.0 in | Wt 176.4 lb

## 2011-06-26 DIAGNOSIS — R7309 Other abnormal glucose: Secondary | ICD-10-CM

## 2011-06-26 DIAGNOSIS — J309 Allergic rhinitis, unspecified: Secondary | ICD-10-CM

## 2011-06-26 DIAGNOSIS — E669 Obesity, unspecified: Secondary | ICD-10-CM

## 2011-06-26 DIAGNOSIS — Z139 Encounter for screening, unspecified: Secondary | ICD-10-CM

## 2011-06-26 DIAGNOSIS — R7303 Prediabetes: Secondary | ICD-10-CM

## 2011-06-26 DIAGNOSIS — E039 Hypothyroidism, unspecified: Secondary | ICD-10-CM

## 2011-06-26 DIAGNOSIS — E785 Hyperlipidemia, unspecified: Secondary | ICD-10-CM

## 2011-06-26 DIAGNOSIS — I495 Sick sinus syndrome: Secondary | ICD-10-CM

## 2011-06-26 NOTE — Assessment & Plan Note (Signed)
Uncontrolled flonase refilled

## 2011-06-26 NOTE — Assessment & Plan Note (Signed)
Uncontrolled and deteriorated. Low fat diet discussed and encouraged. No change in med

## 2011-06-26 NOTE — Patient Instructions (Addendum)
F/u in 4.5 months.  pls cut back on fried and fatty foods, egg yolks and carbs.  Your stated weight loss goal is 5 to 6 pounds in the next 18 weeks.  This will improve your health..  No med changes at this time.   Fasting lipid, hepatic, tsh and HBA1c in 4.5 months LABWORK  NEEDS TO BE DONE BETWEEN 3 TO 7 DAYS BEFORE YOUR NEXT SCEDULED  VISIT.  THIS WILL IMPROVE THE QUALITY OF YOUR CARE. Mammogram to be scheduled at checkout

## 2011-06-26 NOTE — Assessment & Plan Note (Signed)
Deteriorated. Patient re-educated about  the importance of commitment to a  minimum of 150 minutes of exercise per week. The importance of healthy food choices with portion control discussed. Encouraged to start a food diary, count calories and to consider  joining a support group. Sample diet sheets offered. Goals set by the patient for the next several months.    

## 2011-06-26 NOTE — Assessment & Plan Note (Signed)
Controlled, no change in medication  

## 2011-06-26 NOTE — Progress Notes (Signed)
  Subjective:    Patient ID: [name], female    DOB: 03/17/1926, 77 y.o.   MRN: [id number]  HPI The PT is here for follow up and re-evaluation of chronic medical conditions, medication management and review of any available recent lab and radiology data.  Preventive health is updated, specifically  Cancer screening and Immunization.   Questions or concerns regarding consultations or procedures which the PT has had in the interim are  addressed. The PT denies any adverse reactions to current medications since the last visit.  Recently saw cardiology who advised dietary change and weight loss 3 days ago, reportedly she has started complying, her labs are concerning    Review of Systems    See HPI Denies recent fever or chills. Denies sinus pressure, nasal congestion, ear pain or sore throat. Denies chest congestion, productive cough or wheezing. Denies chest pains, palpitations and leg swelling Denies abdominal pain, nausea, vomiting,diarrhea or constipation.   Denies dysuria, frequency, hesitancy or incontinence. Denies joint pain, swelling and limitation in mobility. Denies headaches, seizures, numbness, or tingling. Denies depression, anxiety or insomnia. Denies skin break down or rash.     Objective:   Physical Exam  Patient alert and oriented and in no cardiopulmonary distress.  HEENT: No facial asymmetry, EOMI, no sinus tenderness,  oropharynx pink and moist.  Neck supple no adenopathy.  Chest: Clear to auscultation bilaterally.Decreased throughout  CVS: S1, S2 no murmurs, no S3.  ABD: Soft non tender. Bowel sounds normal.  Ext: No edema  [id number] though  Adequate ROM spine, shoulders, hips and knees.  Skin: Intact, no ulcerations or rash noted.  Psych: Good eye contact, normal affect. Memory intact not anxious or depressed appearing.  CNS: CN 2-12 intact, power, tone and sensation normal throughout.       Assessment & Plan:

## 2011-06-26 NOTE — Assessment & Plan Note (Signed)
Controlled with pacemaker.  

## 2011-06-29 ENCOUNTER — Ambulatory Visit (HOSPITAL_COMMUNITY)
Admission: RE | Admit: 2011-06-29 | Discharge: 2011-06-29 | Disposition: A | Discharge status: Home / Self Care | Payer: Medicare Other | Source: Ambulatory Visit | Attending: Family Medicine | Admitting: Family Medicine

## 2011-06-29 DIAGNOSIS — Z139 Encounter for screening, unspecified: Secondary | ICD-10-CM

## 2011-06-29 DIAGNOSIS — Z1231 Encounter for screening mammogram for malignant neoplasm of breast: Secondary | ICD-10-CM | POA: Insufficient documentation

## 2011-07-17 ENCOUNTER — Ambulatory Visit (INDEPENDENT_AMBULATORY_CARE_PROVIDER_SITE_OTHER): Payer: Medicare Other | Admitting: *Deleted

## 2011-07-17 DIAGNOSIS — Z7901 Long term (current) use of anticoagulants: Secondary | ICD-10-CM

## 2011-07-17 DIAGNOSIS — I4891 Unspecified atrial fibrillation: Secondary | ICD-10-CM

## 2011-07-17 DIAGNOSIS — I480 Paroxysmal atrial fibrillation: Secondary | ICD-10-CM

## 2011-07-26 ENCOUNTER — Other Ambulatory Visit: Payer: Self-pay | Admitting: Family Medicine

## 2011-07-31 ENCOUNTER — Other Ambulatory Visit: Payer: Self-pay | Admitting: Family Medicine

## 2011-08-03 ENCOUNTER — Telehealth: Payer: Self-pay | Admitting: Family Medicine

## 2011-08-04 ENCOUNTER — Encounter: Payer: Self-pay | Admitting: Orthopedic Surgery

## 2011-08-04 NOTE — Telephone Encounter (Signed)
Spoke with daughter she is requesting a letter stating that her mother was hospitalized in April 2011 and that she is her primary caregiver so that she can provide documentation of why she needed to withdraw from school.

## 2011-08-04 NOTE — Telephone Encounter (Signed)
Letter completed.

## 2011-08-04 NOTE — Telephone Encounter (Signed)
pls type letter requesred to state pt was hospitalized in 2011, I see a hospital f/u in May 2011, I will sign when completed

## 2011-08-14 ENCOUNTER — Ambulatory Visit (INDEPENDENT_AMBULATORY_CARE_PROVIDER_SITE_OTHER): Payer: Medicare Other | Admitting: *Deleted

## 2011-08-14 DIAGNOSIS — I4891 Unspecified atrial fibrillation: Secondary | ICD-10-CM

## 2011-08-14 DIAGNOSIS — Z7901 Long term (current) use of anticoagulants: Secondary | ICD-10-CM

## 2011-08-14 DIAGNOSIS — I480 Paroxysmal atrial fibrillation: Secondary | ICD-10-CM

## 2011-08-14 LAB — POCT INR: INR: 1.5

## 2011-08-28 ENCOUNTER — Ambulatory Visit (INDEPENDENT_AMBULATORY_CARE_PROVIDER_SITE_OTHER): Payer: Medicare Other | Admitting: *Deleted

## 2011-08-28 DIAGNOSIS — I4891 Unspecified atrial fibrillation: Secondary | ICD-10-CM

## 2011-08-28 DIAGNOSIS — Z7901 Long term (current) use of anticoagulants: Secondary | ICD-10-CM

## 2011-08-28 DIAGNOSIS — I480 Paroxysmal atrial fibrillation: Secondary | ICD-10-CM

## 2011-08-28 LAB — POCT INR: INR: 2.7

## 2011-09-18 ENCOUNTER — Ambulatory Visit (INDEPENDENT_AMBULATORY_CARE_PROVIDER_SITE_OTHER): Payer: Medicare Other | Admitting: *Deleted

## 2011-09-18 DIAGNOSIS — I480 Paroxysmal atrial fibrillation: Secondary | ICD-10-CM

## 2011-09-18 DIAGNOSIS — Z7901 Long term (current) use of anticoagulants: Secondary | ICD-10-CM

## 2011-09-18 DIAGNOSIS — I4891 Unspecified atrial fibrillation: Secondary | ICD-10-CM

## 2011-09-18 LAB — POCT INR: INR: 2.6

## 2011-09-23 ENCOUNTER — Other Ambulatory Visit: Payer: Self-pay | Admitting: Family Medicine

## 2011-10-16 ENCOUNTER — Ambulatory Visit (INDEPENDENT_AMBULATORY_CARE_PROVIDER_SITE_OTHER): Payer: Medicare Other | Admitting: *Deleted

## 2011-10-16 DIAGNOSIS — I4891 Unspecified atrial fibrillation: Secondary | ICD-10-CM

## 2011-10-16 DIAGNOSIS — Z7901 Long term (current) use of anticoagulants: Secondary | ICD-10-CM

## 2011-10-16 DIAGNOSIS — I480 Paroxysmal atrial fibrillation: Secondary | ICD-10-CM

## 2011-10-27 ENCOUNTER — Other Ambulatory Visit: Payer: Self-pay | Admitting: Family Medicine

## 2011-10-31 ENCOUNTER — Other Ambulatory Visit: Payer: Self-pay | Admitting: *Deleted

## 2011-10-31 MED ORDER — WARFARIN SODIUM 5 MG PO TABS: 5.0000 mg | ORAL_TABLET | ORAL | Status: DC

## 2011-11-03 LAB — TSH: TSH: 2.856 u[IU]/mL (ref 0.350–4.500)

## 2011-11-03 LAB — LIPID PANEL
Cholesterol: 126 mg/dL (ref 0–200)
LDL Cholesterol: 52 mg/dL (ref 0–99)
Total CHOL/HDL Ratio: 3.8 Ratio
VLDL: 41 mg/dL — ABNORMAL HIGH (ref 0–40)

## 2011-11-03 LAB — HEPATIC FUNCTION PANEL
ALT: 11 U/L (ref 0–35)
Bilirubin, Direct: 0.1 mg/dL (ref 0.0–0.3)
Indirect Bilirubin: 0.5 mg/dL (ref 0.0–0.9)
Total Bilirubin: 0.6 mg/dL (ref 0.3–1.2)

## 2011-11-03 LAB — HEMOGLOBIN A1C
Hgb A1c MFr Bld: 6.2 % — ABNORMAL HIGH (ref <5.7)
Mean Plasma Glucose: 131 mg/dL — ABNORMAL HIGH (ref <117)

## 2011-11-09 ENCOUNTER — Ambulatory Visit (INDEPENDENT_AMBULATORY_CARE_PROVIDER_SITE_OTHER): Payer: Medicare Other | Admitting: Family Medicine

## 2011-11-09 ENCOUNTER — Encounter: Payer: Self-pay | Admitting: Family Medicine

## 2011-11-09 VITALS — BP 130/70 | HR 62 | Resp 16 | Ht 65.0 in | Wt 174.0 lb

## 2011-11-09 DIAGNOSIS — E039 Hypothyroidism, unspecified: Secondary | ICD-10-CM

## 2011-11-09 DIAGNOSIS — E669 Obesity, unspecified: Secondary | ICD-10-CM

## 2011-11-09 DIAGNOSIS — R7301 Impaired fasting glucose: Secondary | ICD-10-CM

## 2011-11-09 DIAGNOSIS — I10 Essential (primary) hypertension: Secondary | ICD-10-CM

## 2011-11-09 DIAGNOSIS — M899 Disorder of bone, unspecified: Secondary | ICD-10-CM

## 2011-11-09 DIAGNOSIS — E785 Hyperlipidemia, unspecified: Secondary | ICD-10-CM

## 2011-11-09 DIAGNOSIS — M949 Disorder of cartilage, unspecified: Secondary | ICD-10-CM

## 2011-11-09 MED ORDER — METOPROLOL TARTRATE 100 MG PO TABS: ORAL_TABLET | ORAL | Status: DC

## 2011-11-09 MED ORDER — SYNTHROID 100 MCG PO TABS: ORAL_TABLET | ORAL | Status: DC

## 2011-11-09 MED ORDER — HYDROCHLOROTHIAZIDE 25 MG PO TABS: ORAL_TABLET | ORAL | Status: DC

## 2011-11-09 MED ORDER — LOVASTATIN 40 MG PO TABS: ORAL_TABLET | ORAL | Status: DC

## 2011-11-09 MED ORDER — OMEPRAZOLE 20 MG PO CPDR: DELAYED_RELEASE_CAPSULE | ORAL | Status: DC

## 2011-11-09 NOTE — Assessment & Plan Note (Signed)
Controlled, no change in medication  

## 2011-11-09 NOTE — Assessment & Plan Note (Addendum)
Improved, however triglycerides are still elevated

## 2011-11-09 NOTE — Progress Notes (Signed)
  Subjective:    Patient ID: [name], female    DOB: November 14, 1925, 78 y.o.   MRN: [id number]  HPI The PT is here for follow up and re-evaluation of chronic medical conditions, medication management and review of any available recent lab and radiology data.  Preventive health is updated, specifically  Cancer screening and Immunization.   Questions or concerns regarding consultations or procedures which the PT has had in the interim are  addressed. The PT denies any adverse reactions to current medications since the last visit.  There are no new concerns.  There are no specific complaints       Review of Systems See HPI Denies recent fever or chills. Denies sinus pressure, nasal congestion, ear pain or sore throat. Denies chest congestion, productive cough or wheezing. Denies chest pains, palpitations and leg swelling Denies abdominal pain, nausea, vomiting,diarrhea or constipation.   Denies dysuria, frequency, hesitancy or incontinence. Chronic  joint pain,  and limitation in mobility.Noted to have some gait instability, has not fallen, uses cane Denies headaches, seizures, numbness, or tingling. Denies depression, anxiety or insomnia. Denies skin break down or rash.        Objective:   Physical Exam Patient alert and oriented and in no cardiopulmonary distress.  HEENT: No facial asymmetry, EOMI, no sinus tenderness,  oropharynx pink and moist.  Neck supple no adenopathy.  Chest: Clear to auscultation bilaterally.Decreased air entry , though adequate  CVS: S1, S2 no murmurs, no S3.  ABD: Soft non tender. Bowel sounds normal.  Ext: No edema  MS: decreased  ROM spine, shoulders, hips and knees.  Skin: Intact, no ulcerations or rash noted.  Psych: Good eye contact, normal affect. Memory intact not anxious or depressed appearing.  CNS: CN 2-12 intact, power, tone and sensation normal throughout. Depression screen administered and is negative       Assessment &  Plan:

## 2011-11-09 NOTE — Patient Instructions (Addendum)
F/U in 5 month  Call if you need me before please  Triglycerides are improved, congrats, sugars have gotten a little worse cut back on sweets cakes and cookies.  Remember to change sleep hygiene, we will give you information  You will get information on class regarding diabetic diet  CBC, fasting lipid, cmp, tsh, HBA1C and vit D in 5 month  It is important that you exercise regularly at least 30 minutes 5 times a week. If you develop chest pain, have severe difficulty breathing, or feel very tired, stop exercising immediately and seek medical attention    A healthy diet is rich in fruit, vegetables and whole grains. Poultry fish, nuts and beans are a healthy choice for protein rather then red meat. A low sodium diet and drinking 64 ounces of water daily is generally recommended. Oils and sweet should be limited. Carbohydrates especially for those who are diabetic or overweight, should be limited to 30-45 gram per meal. It is important to eat on a regular schedule, at least 3 times daily. Snacks should be primarily fruits, vegetables or nuts.

## 2011-11-10 NOTE — Assessment & Plan Note (Signed)
Improved. Pt applauded on succesful weight loss through lifestyle change, and encouraged to continue same. Weight loss goal set for the next several months.  

## 2011-11-10 NOTE — Assessment & Plan Note (Signed)
Deteriorated, HBa1c has increased, pt and daughter encouraged to attend diabetic class

## 2011-11-27 ENCOUNTER — Ambulatory Visit (INDEPENDENT_AMBULATORY_CARE_PROVIDER_SITE_OTHER): Payer: Medicare Other | Admitting: *Deleted

## 2011-11-27 DIAGNOSIS — I4891 Unspecified atrial fibrillation: Secondary | ICD-10-CM

## 2011-11-27 DIAGNOSIS — I480 Paroxysmal atrial fibrillation: Secondary | ICD-10-CM

## 2011-11-27 DIAGNOSIS — Z7901 Long term (current) use of anticoagulants: Secondary | ICD-10-CM

## 2011-12-11 ENCOUNTER — Ambulatory Visit (INDEPENDENT_AMBULATORY_CARE_PROVIDER_SITE_OTHER): Payer: Medicare Other | Admitting: Internal Medicine

## 2011-12-11 ENCOUNTER — Encounter: Payer: Self-pay | Admitting: Internal Medicine

## 2011-12-11 VITALS — BP 152/57 | HR 72 | Resp 18 | Ht 65.0 in | Wt 174.0 lb

## 2011-12-11 DIAGNOSIS — I480 Paroxysmal atrial fibrillation: Secondary | ICD-10-CM

## 2011-12-11 DIAGNOSIS — I4891 Unspecified atrial fibrillation: Secondary | ICD-10-CM

## 2011-12-11 DIAGNOSIS — I495 Sick sinus syndrome: Secondary | ICD-10-CM

## 2011-12-11 DIAGNOSIS — Z950 Presence of cardiac pacemaker: Secondary | ICD-10-CM

## 2011-12-11 LAB — PACEMAKER DEVICE OBSERVATION
AL AMPLITUDE: 1.6 mv
BAMS-0001: 150 {beats}/min
DEVICE MODEL PM: 7117341
RV LEAD AMPLITUDE: 7.7 mv
RV LEAD THRESHOLD: 0.875 V

## 2011-12-11 NOTE — Assessment & Plan Note (Signed)
Her device is working normally. Will recheck in several months. 

## 2011-12-11 NOTE — Progress Notes (Signed)
HPI [name] returns today for followup. She is a pleasant 78 yo woman with a h/o PAF, symptomatic bradycardia, s/p PPM. She also has HTN. She has been trying to lose weight over the past few months. She denies chest pain or sob. No syncope. Minimal palpitations. No Known Allergies   Current Outpatient Prescriptions  Medication Sig Dispense Refill  . Acetaminophen (TYLENOL PO) Take by mouth as needed.        . Calcium Carbonate (CALCIUM 600 PO) Take 1 tablet by mouth.        . cefUROXime (CEFTIN) 500 MG tablet Take 500 mg by mouth 2 (two) times daily.       . cholecalciferol (VITAMIN D) 1000 UNIT tablet Take 1,000 Units by mouth daily.        . cyanocobalamin 1000 MCG tablet Take 100 mcg by mouth daily.      . digoxin (LANOXIN) 0.125 MG tablet TAKE 1 TABLET BY MOUTH   ONCE A DAY.  30 tablet  6  . FLONASE 50 MCG/ACT nasal spray USE 1 SPRAY IN EACH NOSTRIL DAILY.  16 g  3  . hydrochlorothiazide (HYDRODIURIL) 25 MG tablet TAKE 1 TABLET BY MOUTH ONCE DAILY.  30 tablet  3  . lovastatin (MEVACOR) 40 MG tablet TAKE 2 TABLETS BY MOUTH AT BEDTIME.  60 tablet  3  . metoprolol (LOPRESSOR) 100 MG tablet TAKE 1 TABLET BY MOUTH TWICE A DAY.  60 tablet  3  . Multiple Vitamins-Minerals (EYE VITAMINS PO) Take by mouth.      Marland Kitchen omeprazole (PRILOSEC) 20 MG capsule TAKE (1) CAPSULE BY MOUTH ONCE DAILY FOR ACID REFLUX.  30 capsule  3  . Potassium 99 MG TABS Take by mouth daily.        Marland Kitchen SYNTHROID 100 MCG tablet TAKE 1 TABLET BY MOUTH MONDAY THROUGH FRIDAY AND TAKE (1 AND 1/2) TABLET ON SATURDAY AND SUNDAY AS DIRECTED FOR THYROID.  40 each  3  . vitamin E (VITAMIN E) 1000 UNIT capsule Take 1,000 Units by mouth daily.        Marland Kitchen warfarin (COUMADIN) 5 MG tablet Take 1 tablet (5 mg total) by mouth as directed.  15 tablet  1   Current Facility-Administered Medications  Medication Dose Route Frequency Provider Last Rate Last Dose  . Influenza (>/= 3 years) inactive virus vaccine (FLVIRIN/FLUZONE) injection SUSP 0.5 mL   0.5 mL Intramuscular Once Fayrene Helper, MD         Past Medical History  Diagnosis Date  . Hypertension   . Hyperlipidemia   . Pancreatitis   . Gastroesophageal reflux disease   . Impaired glucose tolerance   . Hypothyroidism   . Macular degeneration   . Sleep apnea     Nocturnal oxygen therapy  . Arthritis   . Sick sinus syndrome 2011    Atrial fibrilation 10/2009; and dual chamber pacemaker in 4/11; normal EF  . Paroxysmal atrial fibrillation 2011  . Hearing impairment     ROS:   All systems reviewed and negative except as noted in the HPI.   Past Surgical History  Procedure Date  . Abdominal hysterectomy   . Salpingoophorectomy 1970    right  . Cataract extraction     Bilateral  . Pacemaker insertion 2011    dual chamber      Family History  Problem Relation Age of Onset  . Cancer Mother     gential  . Cancer Father     throat   .  Pneumonia Sister   . Heart disease    . Arthritis    . Lung disease    . Asthma       History   Social History  . Marital Status: Widowed    Spouse Name: N/A    Number of Children: 7  . Years of Education: college   Occupational History  . retired     Social History Main Topics  . Smoking status: Former Smoker    Types: Cigarettes  . Smokeless tobacco: Not on file  . Alcohol Use: No  . Drug Use: Not on file  . Sexually Active: Not on file   Other Topics Concern  . Not on file   Social History Narrative  . No narrative on file     BP 152/57  Pulse 72  Resp 18  Ht 5\' 5"  (1.651 m)  Wt 78.926 kg (174 lb)  BMI 28.96 kg/m2  Physical Exam:  Well appearing 78 yo woman, NAD HEENT: Unremarkable Neck:  No JVD, no thyromegally Lymphatics:  No adenopathy Back:  No CVA tenderness Lungs:  Clear with no wheezes. Well healed PPM incision. HEART:  Regular rate rhythm, no murmurs, no rubs, no clicks Abd:  soft, positive bowel sounds, no organomegally, no rebound, no guarding Ext:  2 plus pulses, no  edema, no cyanosis, no clubbing Skin:  No rashes no nodules Neuro:  CN II through XII intact, motor grossly intact  DEVICE  Normal device function.  See PaceArt for details.   Assess/Plan:

## 2011-12-11 NOTE — Patient Instructions (Signed)
**Note De-Identified  Obfuscation** Your physician recommends that you schedule a follow-up appointment in: 6 month for device check and in 1 year with Dr. Lovena Le

## 2011-12-11 NOTE — Assessment & Plan Note (Signed)
She appears to be maintaining NSR very nicely as she is out of rhythm less than 1% of the time on PPM interogation.

## 2011-12-15 ENCOUNTER — Encounter: Payer: Self-pay | Admitting: Internal Medicine

## 2011-12-29 ENCOUNTER — Other Ambulatory Visit: Payer: Self-pay | Admitting: Adult Health

## 2012-01-10 ENCOUNTER — Ambulatory Visit (INDEPENDENT_AMBULATORY_CARE_PROVIDER_SITE_OTHER): Payer: Medicare Other | Admitting: *Deleted

## 2012-01-10 DIAGNOSIS — Z7901 Long term (current) use of anticoagulants: Secondary | ICD-10-CM

## 2012-01-10 DIAGNOSIS — I4891 Unspecified atrial fibrillation: Secondary | ICD-10-CM

## 2012-01-10 DIAGNOSIS — I480 Paroxysmal atrial fibrillation: Secondary | ICD-10-CM

## 2012-01-10 LAB — POCT INR: INR: 3.1

## 2012-02-01 ENCOUNTER — Other Ambulatory Visit: Payer: Self-pay | Admitting: Cardiology

## 2012-02-01 NOTE — Telephone Encounter (Signed)
Please refill Thanks [name]

## 2012-02-21 ENCOUNTER — Ambulatory Visit (INDEPENDENT_AMBULATORY_CARE_PROVIDER_SITE_OTHER): Payer: Medicare Other | Admitting: *Deleted

## 2012-02-21 DIAGNOSIS — Z7901 Long term (current) use of anticoagulants: Secondary | ICD-10-CM

## 2012-02-21 DIAGNOSIS — I480 Paroxysmal atrial fibrillation: Secondary | ICD-10-CM

## 2012-02-21 DIAGNOSIS — I4891 Unspecified atrial fibrillation: Secondary | ICD-10-CM

## 2012-03-23 ENCOUNTER — Other Ambulatory Visit: Payer: Self-pay | Admitting: Family Medicine

## 2012-03-28 ENCOUNTER — Other Ambulatory Visit: Payer: Self-pay | Admitting: Family Medicine

## 2012-04-03 ENCOUNTER — Encounter (INDEPENDENT_AMBULATORY_CARE_PROVIDER_SITE_OTHER): Payer: Medicare Other | Admitting: Ophthalmology

## 2012-04-03 DIAGNOSIS — I10 Essential (primary) hypertension: Secondary | ICD-10-CM

## 2012-04-03 DIAGNOSIS — H43819 Vitreous degeneration, unspecified eye: Secondary | ICD-10-CM

## 2012-04-03 DIAGNOSIS — H3530 Unspecified macular degeneration: Secondary | ICD-10-CM

## 2012-04-03 DIAGNOSIS — H35039 Hypertensive retinopathy, unspecified eye: Secondary | ICD-10-CM

## 2012-04-04 ENCOUNTER — Ambulatory Visit (INDEPENDENT_AMBULATORY_CARE_PROVIDER_SITE_OTHER): Payer: Medicare Other | Admitting: *Deleted

## 2012-04-04 DIAGNOSIS — Z7901 Long term (current) use of anticoagulants: Secondary | ICD-10-CM

## 2012-04-04 DIAGNOSIS — I4891 Unspecified atrial fibrillation: Secondary | ICD-10-CM

## 2012-04-04 DIAGNOSIS — I480 Paroxysmal atrial fibrillation: Secondary | ICD-10-CM

## 2012-04-05 ENCOUNTER — Other Ambulatory Visit: Payer: Self-pay | Admitting: Family Medicine

## 2012-04-06 LAB — CBC
MCH: 29.5 pg (ref 26.0–34.0)
MCHC: 34.9 g/dL (ref 30.0–36.0)
MCV: 84.4 fL (ref 78.0–100.0)
Platelets: 284 10*3/uL (ref 150–400)
RDW: 13.9 % (ref 11.5–15.5)

## 2012-04-06 LAB — COMPREHENSIVE METABOLIC PANEL
ALT: 11 U/L (ref 0–35)
AST: 17 U/L (ref 0–37)
Creat: 0.92 mg/dL (ref 0.50–1.10)
Total Bilirubin: 0.5 mg/dL (ref 0.3–1.2)

## 2012-04-06 LAB — LIPID PANEL
HDL: 34 mg/dL — ABNORMAL LOW (ref >39)
Total CHOL/HDL Ratio: 4.4 Ratio
VLDL: 58 mg/dL — ABNORMAL HIGH (ref 0–40)

## 2012-04-06 LAB — HEMOGLOBIN A1C: Hgb A1c MFr Bld: 5.9 % — ABNORMAL HIGH (ref <5.7)

## 2012-04-06 LAB — TSH: TSH: 1.173 u[IU]/mL (ref 0.350–4.500)

## 2012-04-10 ENCOUNTER — Encounter: Payer: Self-pay | Admitting: Family Medicine

## 2012-04-10 ENCOUNTER — Ambulatory Visit (INDEPENDENT_AMBULATORY_CARE_PROVIDER_SITE_OTHER): Payer: Medicare Other | Admitting: Family Medicine

## 2012-04-10 VITALS — BP 122/64 | HR 65 | Resp 16 | Ht 65.0 in | Wt 169.0 lb

## 2012-04-10 DIAGNOSIS — E669 Obesity, unspecified: Secondary | ICD-10-CM

## 2012-04-10 DIAGNOSIS — R7301 Impaired fasting glucose: Secondary | ICD-10-CM

## 2012-04-10 DIAGNOSIS — E785 Hyperlipidemia, unspecified: Secondary | ICD-10-CM

## 2012-04-10 DIAGNOSIS — J309 Allergic rhinitis, unspecified: Secondary | ICD-10-CM

## 2012-04-10 DIAGNOSIS — I10 Essential (primary) hypertension: Secondary | ICD-10-CM

## 2012-04-10 DIAGNOSIS — E039 Hypothyroidism, unspecified: Secondary | ICD-10-CM

## 2012-04-10 NOTE — Patient Instructions (Addendum)
Annual exam in 4 months, please call if you need me before  Call in October for flu vaccine  You have been doing very well, keep it up, no changes in medication   Please cut back on fried and fatty foods, butter, oils, red meat, peanut butter  Please continue regular physical activity  Fasting lipid , cmp and TSH in January before visit

## 2012-04-11 NOTE — Assessment & Plan Note (Signed)
Controlled, no change in medication  

## 2012-04-11 NOTE — Progress Notes (Signed)
  Subjective:    Patient ID: [name], female    DOB: 1926/02/15, 78 y.o.   MRN: [id number]  HPI The PT is here for follow up and re-evaluation of chronic medical conditions, medication management and review of any available recent lab and radiology data.  Preventive health is updated, specifically  Cancer screening and Immunization.   Questions or concerns regarding consultations or procedures which the PT has had in the interim are  Addressed.Recently saw pulmonary due to use of chronic oxygen, states she was advised no need to return for 1 year. She remains non compliant with CPAP The PT denies any adverse reactions to current medications since the last visit.  There are no new concerns. She has worked on dietary change with excellent results for which she is applauded There are no specific complaints       Review of Systems See HPI Denies recent fever or chills. Denies sinus pressure, nasal congestion, ear pain or sore throat. Denies chest congestion, productive cough or wheezing. Denies chest pains, palpitations and leg swelling Denies abdominal pain, nausea, vomiting,diarrhea or constipation.   Denies dysuria, frequency, hesitancy or incontinence. Denies uncontrolled  joint pain, she does have  limitation in mobility. Denies headaches, seizures, numbness, or tingling. Denies depression, anxiety or insomnia. Denies skin break down or rash.        Objective:   Physical Exam  Patient alert and oriented and in no cardiopulmonary distress.  HEENT: No facial asymmetry, EOMI, no sinus tenderness,  oropharynx pink and moist.  Neck supple no adenopathy.  Chest: Clear to auscultation bilaterally.Decreased air entry throughout  CVS: S1, S2 no murmurs, no S3.  ABD: Soft non tender. Bowel sounds normal.  Ext: No edema  MS: Adequate though reduced ROM spine, shoulders, hips and knees.  Skin: Intact, no ulcerations or rash noted.  Psych: Good eye contact, normal affect.  Memory intact not anxious or depressed appearing.  CNS: CN 2-12 intact, power, tone and sensation normal throughout.       Assessment & Plan:

## 2012-04-11 NOTE — Assessment & Plan Note (Signed)
.  lp1 Triglycerides have increased, no med change, just dietary modification

## 2012-04-11 NOTE — Assessment & Plan Note (Signed)
Controlled, no change in medication DASH diet and commitment to daily physical activity for a minimum of 30 minutes discussed and encouraged, as a part of hypertension management. The importance of attaining a healthy weight is also discussed.  

## 2012-04-11 NOTE — Assessment & Plan Note (Signed)
Improved. Pt applauded on succesful weight loss through lifestyle change, and encouraged to continue same. Weight loss goal set for the next several months.  

## 2012-04-11 NOTE — Assessment & Plan Note (Signed)
Improved, pt applauded on lifestyle chane, esp diet , to facilitate this.  Patient re  educated about the importance of limiting  Carbohydrate intake , the need to commit to daily physical activity for a minimum of 30 minutes , and to commit weight loss. The fact that changes in all these areas will reduce or eliminate all together the development of diabetes is stressed.

## 2012-04-18 ENCOUNTER — Ambulatory Visit (INDEPENDENT_AMBULATORY_CARE_PROVIDER_SITE_OTHER): Payer: Medicare Other | Admitting: *Deleted

## 2012-04-18 DIAGNOSIS — I480 Paroxysmal atrial fibrillation: Secondary | ICD-10-CM

## 2012-04-18 DIAGNOSIS — I4891 Unspecified atrial fibrillation: Secondary | ICD-10-CM

## 2012-04-18 DIAGNOSIS — Z7901 Long term (current) use of anticoagulants: Secondary | ICD-10-CM

## 2012-05-16 ENCOUNTER — Encounter: Payer: Self-pay | Admitting: *Deleted

## 2012-05-22 ENCOUNTER — Ambulatory Visit (INDEPENDENT_AMBULATORY_CARE_PROVIDER_SITE_OTHER): Payer: Medicare Other | Admitting: *Deleted

## 2012-05-22 DIAGNOSIS — I4891 Unspecified atrial fibrillation: Secondary | ICD-10-CM

## 2012-05-22 DIAGNOSIS — I480 Paroxysmal atrial fibrillation: Secondary | ICD-10-CM

## 2012-05-22 DIAGNOSIS — Z7901 Long term (current) use of anticoagulants: Secondary | ICD-10-CM

## 2012-05-22 LAB — POCT INR: INR: 2.2

## 2012-05-27 ENCOUNTER — Other Ambulatory Visit: Payer: Self-pay | Admitting: Cardiology

## 2012-06-04 ENCOUNTER — Encounter: Payer: Self-pay | Admitting: *Deleted

## 2012-06-14 ENCOUNTER — Ambulatory Visit (INDEPENDENT_AMBULATORY_CARE_PROVIDER_SITE_OTHER): Payer: Medicare Other | Admitting: *Deleted

## 2012-06-14 ENCOUNTER — Ambulatory Visit (INDEPENDENT_AMBULATORY_CARE_PROVIDER_SITE_OTHER): Payer: Medicare Other

## 2012-06-14 DIAGNOSIS — Z23 Encounter for immunization: Secondary | ICD-10-CM

## 2012-06-14 DIAGNOSIS — I495 Sick sinus syndrome: Secondary | ICD-10-CM

## 2012-06-14 LAB — PACEMAKER DEVICE OBSERVATION
AL AMPLITUDE: 2.2 mv
AL THRESHOLD: 1.25 V
BAMS-0001: 150 {beats}/min
DEVICE MODEL PM: 7117341
RV LEAD AMPLITUDE: 9.5 mv
RV LEAD IMPEDENCE PM: 425 Ohm
RV LEAD THRESHOLD: 1 V

## 2012-06-14 NOTE — Progress Notes (Signed)
PPM check 

## 2012-06-19 ENCOUNTER — Ambulatory Visit (INDEPENDENT_AMBULATORY_CARE_PROVIDER_SITE_OTHER): Payer: Medicare Other | Admitting: *Deleted

## 2012-06-19 DIAGNOSIS — Z7901 Long term (current) use of anticoagulants: Secondary | ICD-10-CM

## 2012-06-19 DIAGNOSIS — I4891 Unspecified atrial fibrillation: Secondary | ICD-10-CM

## 2012-06-19 DIAGNOSIS — I480 Paroxysmal atrial fibrillation: Secondary | ICD-10-CM

## 2012-06-19 LAB — POCT INR: INR: 4

## 2012-06-27 ENCOUNTER — Ambulatory Visit (INDEPENDENT_AMBULATORY_CARE_PROVIDER_SITE_OTHER): Payer: Medicare Other | Admitting: *Deleted

## 2012-06-27 DIAGNOSIS — I4891 Unspecified atrial fibrillation: Secondary | ICD-10-CM

## 2012-06-27 DIAGNOSIS — I480 Paroxysmal atrial fibrillation: Secondary | ICD-10-CM

## 2012-06-27 DIAGNOSIS — Z7901 Long term (current) use of anticoagulants: Secondary | ICD-10-CM

## 2012-07-01 ENCOUNTER — Encounter: Payer: Self-pay | Admitting: Internal Medicine

## 2012-07-08 ENCOUNTER — Encounter: Payer: Self-pay | Admitting: Cardiology

## 2012-07-08 ENCOUNTER — Ambulatory Visit (INDEPENDENT_AMBULATORY_CARE_PROVIDER_SITE_OTHER): Payer: Medicare Other | Admitting: Cardiology

## 2012-07-08 ENCOUNTER — Ambulatory Visit (INDEPENDENT_AMBULATORY_CARE_PROVIDER_SITE_OTHER): Payer: Medicare Other | Admitting: *Deleted

## 2012-07-08 VITALS — BP 122/70 | HR 61 | Ht 65.0 in | Wt 168.0 lb

## 2012-07-08 DIAGNOSIS — Z7901 Long term (current) use of anticoagulants: Secondary | ICD-10-CM

## 2012-07-08 DIAGNOSIS — I480 Paroxysmal atrial fibrillation: Secondary | ICD-10-CM

## 2012-07-08 DIAGNOSIS — I10 Essential (primary) hypertension: Secondary | ICD-10-CM

## 2012-07-08 DIAGNOSIS — H9190 Unspecified hearing loss, unspecified ear: Secondary | ICD-10-CM

## 2012-07-08 DIAGNOSIS — I4891 Unspecified atrial fibrillation: Secondary | ICD-10-CM

## 2012-07-08 DIAGNOSIS — Z950 Presence of cardiac pacemaker: Secondary | ICD-10-CM

## 2012-07-08 DIAGNOSIS — E785 Hyperlipidemia, unspecified: Secondary | ICD-10-CM

## 2012-07-08 DIAGNOSIS — E039 Hypothyroidism, unspecified: Secondary | ICD-10-CM

## 2012-07-08 LAB — POCT INR: INR: 2.4

## 2012-07-08 NOTE — Patient Instructions (Addendum)
Your physician recommends that you schedule a follow-up appointment in: 1 year  Stool cards x 3 and return to office asap

## 2012-07-08 NOTE — Progress Notes (Signed)
Patient ID: [name], female   DOB: August 31, 1925, 78 y.o.   MRN: [id number]  HPI: Scheduled return visit for this nice woman with paroxysmal atrial fibrillation/sick sinus syndrome, prior pacemaker implantation, chronic anticoagulation, hypertension and hyperlipidemia.  She has done extremely well over the past year with no cardiopulmonary symptoms, no hospitalizations and no new medical problems.  Colonoscopy was last performed 4 years ago and was negative.  Prior to Admission medications   Medication Sig Start Date End Date Taking? Authorizing Provider  FLONASE 50 MCG/ACT nasal spray USE 1 SPRAY IN EACH NOSTRIL DAILY. 10/27/11  Yes Fayrene Helper, MD  hydrochlorothiazide (HYDRODIURIL) 25 MG tablet TAKE 1 TABLET BY MOUTH ONCE DAILY. 03/23/12  Yes Fayrene Helper, MD  LOPRESSOR 100 MG tablet TAKE 1 TABLET BY MOUTH TWICE A DAY. 03/23/12  Yes Fayrene Helper, MD  MEVACOR 40 MG tablet TAKE 2 TABLETS BY MOUTH AT BEDTIME. 03/28/12  Yes Fayrene Helper, MD  omeprazole (PRILOSEC) 20 MG capsule TAKE (1) CAPSULE BY MOUTH ONCE DAILY FOR ACID REFLUX. 03/28/12  Yes Fayrene Helper, MD  Potassium 99 MG TABS Take by mouth daily.     Yes Historical Provider, MD  SYNTHROID 100 MCG tablet TAKE 1 TABLET BY MOUTH MONDAY THROUGH FRIDAY AND TAKE (1 AND 1/2) TABLET ON SATURDAY AND SUNDAY AS DIRECTED FOR THYROID. 03/23/12  Yes Fayrene Helper, MD  warfarin (COUMADIN) 5 MG tablet TAKE AS DIRECTED. 05/27/12  Yes Yehuda Savannah, MD   No Known Allergies    Past medical history, social history, and family history reviewed and updated.  ROS: Denies dyspnea, orthopnea, PND, chest pain, palpitations, lightheadedness or syncope.  Intermittent pedal edema is noted, but has not been a problem.  All other systems reviewed and are negative.  PHYSICAL EXAM: BP 122/70  Pulse 61  Ht 5\' 5"  (1.651 m)  Wt 76.204 kg (168 lb)  BMI 27.96 kg/m2  General-Well developed; no acute distress Body habitus-proportionate weight  and height Neck-No JVD; no carotid bruits Lungs-clear lung fields; resonant to percussion Cardiovascular-normal PMI; normal S1 and S2; regular rhythm; fourth heart sound present Abdomen-normal bowel sounds; soft and non-tender without masses or organomegaly Musculoskeletal-No deformities, no cyanosis or clubbing Neurologic-Normal cranial nerves; symmetric strength and tone Skin-Warm, no significant lesions Extremities-distal pulses intact; Trace edema  Rhythm Strip: Normal sinus rhythm at a rate of 61 bpm; sinus arrhythmia; first degree AV block.  ASSESSMENT AND PLAN:  Jacqulyn Ducking, MD 07/08/2012 12:58 PM

## 2012-07-08 NOTE — Assessment & Plan Note (Addendum)
Excellent lipid profile with persistent hypertriglyceridemia.  Particularly in the absence of known vascular disease, additional therapy is not necessary.

## 2012-07-08 NOTE — Progress Notes (Deleted)
Name: [name]    DOB: 11-Aug-1926  Age: 78 y.o.  MR#: [id number]       PCP:  Tula Nakayama, MD      Insurance: @PAYORNAME @   CC:   No chief complaint on file.  MEDICATION BOTTLES  VS BP 122/70  Pulse 61  Ht 5\' 5"  (1.651 m)  Wt 168 lb (76.204 kg)  BMI 27.96 kg/m2  Weights Current Weight  07/08/12 168 lb (76.204 kg)  04/10/12 169 lb (76.658 kg)  12/11/11 174 lb (78.926 kg)    Blood Pressure  BP Readings from Last 3 Encounters:  07/08/12 122/70  04/10/12 122/64  12/11/11 152/57     Admit date:  (Not on file) Last encounter with [facility]:  05/27/2012   Allergy No Known Allergies  Current Outpatient Prescriptions  Medication Sig Dispense Refill  . FLONASE 50 MCG/ACT nasal spray USE 1 SPRAY IN EACH NOSTRIL DAILY.  16 g  3  . hydrochlorothiazide (HYDRODIURIL) 25 MG tablet TAKE 1 TABLET BY MOUTH ONCE DAILY.  30 tablet  3  . LOPRESSOR 100 MG tablet TAKE 1 TABLET BY MOUTH TWICE A DAY.  60 each  3  . MEVACOR 40 MG tablet TAKE 2 TABLETS BY MOUTH AT BEDTIME.  60 each  3  . omeprazole (PRILOSEC) 20 MG capsule TAKE (1) CAPSULE BY MOUTH ONCE DAILY FOR ACID REFLUX.  30 capsule  3  . Potassium 99 MG TABS Take by mouth daily.        Marland Kitchen SYNTHROID 100 MCG tablet TAKE 1 TABLET BY MOUTH MONDAY THROUGH FRIDAY AND TAKE (1 AND 1/2) TABLET ON SATURDAY AND SUNDAY AS DIRECTED FOR THYROID.  40 each  3  . warfarin (COUMADIN) 5 MG tablet TAKE AS DIRECTED.  20 tablet  2   Current Facility-Administered Medications  Medication Dose Route Frequency Provider Last Rate Last Dose  . [DISCONTINUED] Influenza (>/= 3 years) inactive virus vaccine (FLVIRIN/FLUZONE) injection SUSP 0.5 mL  0.5 mL Intramuscular Once Fayrene Helper, MD        Discontinued Meds:    Medications Discontinued During This Encounter  Medication Reason  . Acetaminophen (TYLENOL PO) Patient has not taken in last 30 days  . Calcium Carbonate (CALCIUM 600 PO) Patient has not taken in last 30 days  . cholecalciferol (VITAMIN D) 1000  UNIT tablet Patient has not taken in last 30 days  . cyanocobalamin 1000 MCG tablet Patient has not taken in last 30 days  . digoxin (LANOXIN) 0.125 MG tablet Discontinued by provider  . Influenza (>/= 3 years) inactive virus vaccine (FLVIRIN/FLUZONE) injection SUSP 0.5 mL Patient has not taken in last 30 days  . Multiple Vitamins-Minerals (EYE VITAMINS PO) Patient has not taken in last 30 days  . vitamin E (VITAMIN E) 1000 UNIT capsule Discontinued by provider    Patient Active Problem List  Diagnosis  . HYPOTHYROIDISM  . HYPERLIPIDEMIA  . OBESITY, UNSPECIFIED  . ANXIETY  . HEARING LOSS  . HYPERTENSION  . Paroxysmal atrial fibrillation  . ALLERGIC RHINITIS CAUSE UNSPECIFIED  . MEMORY LOSS  . PPM-St.Jude  . Impaired fasting glucose  . Chronic anticoagulation  . Sick sinus syndrome  . Sleep apnea    LABS Anti-coag visit on 06/19/2012  Component Date Value  . INR 06/27/2012 2.2   Clinical Support on 06/14/2012  Component Date Value  . DEVICE MODEL PM 06/14/2012 [id number]   . [id number] 06/14/2012 Deboraha Sprang  M.D.   . Lynelle Doctor 06/14/2012 Deboraha Sprang  M.D.   Marland Kitchen PACEART TECH NOTES PM 06/14/2012                     Value:Pacemaker check in clinic. Normal device function. Thresholds, sensing, impedances consistent with previous measurements. Device programmed to maximize longevity. 471 mode switches, <1%, + coumadin.  No high ventricular rates noted. Device programmed at                          appropriate safety margins. Histogram distribution appropriate for patient activity level. Device programmed to optimize intrinsic conduction.  Patient education completed.  ROV 6 months with Dr. Lovena Le in [facility].  Marland Kitchen ATRIAL PACING PM 06/14/2012 92   . VENTRICULAR PACING PM 06/14/2012 7.4   . BATTERY VOLTAGE 06/14/2012 [id number]   . AL IMPEDENCE PM 06/14/2012 362.5   . RV LEAD IMPEDENCE PM 06/14/2012 425.0   . AL AMPLITUDE 06/14/2012 2.2   . RV LEAD AMPLITUDE 06/14/2012 9.5   . AL  THRESHOLD 06/14/2012 1.25   . RV LEAD THRESHOLD 06/14/2012 1   . BAMS-0001 06/14/2012 150   . BAMS-0003 06/14/2012 70   Anti-coag visit on 05/22/2012  Component Date Value  . INR 06/19/2012 4.0   Anti-coag visit on 04/18/2012  Component Date Value  . INR 05/22/2012 2.2      Results for this Opt Visit:     Results for orders placed in visit on 06/19/12  POCT INR      Component Value Range   INR 2.2      EKG Orders placed in visit on 12/15/11  . EKG 12-LEAD     Prior Assessment and Plan Problem List as of 07/08/2012            Cardiology Problems   HYPERLIPIDEMIA   Last Assessment & Plan Note   04/10/2012 Office Visit Signed 04/11/2012  6:13 AM by Fayrene Helper, MD    .lp1 Triglycerides have increased, no med change, just dietary modification    HYPERTENSION   Last Assessment & Plan Note   04/10/2012 Office Visit Signed 04/11/2012  6:14 AM by Fayrene Helper, MD    Controlled, no change in medication DASH diet and commitment to daily physical activity for a minimum of 30 minutes discussed and encouraged, as a part of hypertension management. The importance of attaining a healthy weight is also discussed.     Paroxysmal atrial fibrillation   Last Assessment & Plan Note   12/11/2011 Office Visit Signed 12/11/2011 11:33 AM by Evans Lance, MD    She appears to be maintaining NSR very nicely as she is out of rhythm less than 1% of the time on PPM interogation.    Sick sinus syndrome   Last Assessment & Plan Note   06/26/2011 Office Visit Signed 06/26/2011 12:49 PM by Fayrene Helper, MD    Controlled with pacemaker      Other   HYPOTHYROIDISM   Last Assessment & Plan Note   04/10/2012 Office Visit Signed 04/11/2012  6:14 AM by Fayrene Helper, MD    Controlled, no change in medication     OBESITY, UNSPECIFIED   Last Assessment & Plan Note   04/10/2012 Office Visit Signed 04/11/2012  6:15 AM by Fayrene Helper, MD    Improved. Pt applauded on  succesful weight loss through lifestyle change, and encouraged to continue same. Weight loss goal set for the next several months.     ANXIETY  HEARING LOSS   ALLERGIC RHINITIS CAUSE UNSPECIFIED   Last Assessment & Plan Note   04/10/2012 Office Visit Signed 04/11/2012  6:13 AM by Fayrene Helper, MD    Controlled, no change in medication     MEMORY LOSS   PPM-St.Jude   Last Assessment & Plan Note   12/11/2011 Office Visit Signed 12/11/2011 11:33 AM by Evans Lance, MD    Her device is working normally. Will recheck in several months.    Impaired fasting glucose   Last Assessment & Plan Note   04/10/2012 Office Visit Signed 04/11/2012  6:15 AM by Fayrene Helper, MD    Improved, pt applauded on lifestyle chane, esp diet , to facilitate this.  Patient re  educated about the importance of limiting  Carbohydrate intake , the need to commit to daily physical activity for a minimum of 30 minutes , and to commit weight loss. The fact that changes in all these areas will reduce or eliminate all together the development of diabetes is stressed.        Chronic anticoagulation   Last Assessment & Plan Note   06/23/2011 Office Visit Signed 06/23/2011 12:04 PM by Yehuda Savannah, MD    A repeat CBC will be obtained as well as stool for Hemoccult testing to exclude occult GI blood loss.    Sleep apnea       Imaging: No results found.   FRS Calculation: Score not calculated

## 2012-07-08 NOTE — Assessment & Plan Note (Signed)
Stable anemia without evidence for GI blood loss.  Samples for stool Hemoccult testing once again requested.

## 2012-07-08 NOTE — Assessment & Plan Note (Addendum)
No clinical evidence for arrhythmia.  Pacemaker interrogation in the past has identified episodes occupying a small percentage of recorded EKG.  Rate control with anticoagulation will remain our strategy.

## 2012-07-08 NOTE — Assessment & Plan Note (Signed)
Euthyroid;  TSH-0.7 in 05/2011, 1.2 in 03/2012

## 2012-07-08 NOTE — Assessment & Plan Note (Signed)
Blood pressure determinations have been excellent in recent months; current therapy will be continued.

## 2012-07-24 ENCOUNTER — Other Ambulatory Visit: Payer: Self-pay

## 2012-07-24 MED ORDER — METOPROLOL TARTRATE 100 MG PO TABS: ORAL_TABLET | ORAL | Status: DC

## 2012-07-24 MED ORDER — HYDROCHLOROTHIAZIDE 25 MG PO TABS: ORAL_TABLET | ORAL | Status: DC

## 2012-07-24 MED ORDER — OMEPRAZOLE 20 MG PO CPDR: DELAYED_RELEASE_CAPSULE | ORAL | Status: DC

## 2012-07-24 MED ORDER — LOVASTATIN 40 MG PO TABS: ORAL_TABLET | ORAL | Status: DC

## 2012-07-24 MED ORDER — SYNTHROID 100 MCG PO TABS: ORAL_TABLET | ORAL | Status: DC

## 2012-08-07 ENCOUNTER — Ambulatory Visit (INDEPENDENT_AMBULATORY_CARE_PROVIDER_SITE_OTHER): Payer: Medicare Other | Admitting: *Deleted

## 2012-08-07 DIAGNOSIS — I4891 Unspecified atrial fibrillation: Secondary | ICD-10-CM

## 2012-08-07 DIAGNOSIS — I480 Paroxysmal atrial fibrillation: Secondary | ICD-10-CM

## 2012-08-07 DIAGNOSIS — Z7901 Long term (current) use of anticoagulants: Secondary | ICD-10-CM

## 2012-08-23 ENCOUNTER — Other Ambulatory Visit: Payer: Self-pay | Admitting: Cardiology

## 2012-08-23 NOTE — Telephone Encounter (Signed)
rx sent to pharmacy by e-script Per pt compliance noted in chart

## 2012-08-29 ENCOUNTER — Ambulatory Visit (INDEPENDENT_AMBULATORY_CARE_PROVIDER_SITE_OTHER): Payer: Medicare Other | Admitting: *Deleted

## 2012-08-29 DIAGNOSIS — I480 Paroxysmal atrial fibrillation: Secondary | ICD-10-CM

## 2012-08-29 DIAGNOSIS — I4891 Unspecified atrial fibrillation: Secondary | ICD-10-CM

## 2012-08-29 DIAGNOSIS — Z7901 Long term (current) use of anticoagulants: Secondary | ICD-10-CM

## 2012-08-29 LAB — POCT INR: INR: 2.8

## 2012-09-05 ENCOUNTER — Other Ambulatory Visit: Payer: Self-pay

## 2012-09-05 MED ORDER — FLUTICASONE PROPIONATE 50 MCG/ACT NA SUSP: NASAL | Status: DC

## 2012-09-06 ENCOUNTER — Other Ambulatory Visit: Payer: Self-pay | Admitting: Family Medicine

## 2012-09-07 LAB — TSH: TSH: 2.142 u[IU]/mL (ref 0.350–4.500)

## 2012-09-07 LAB — COMPREHENSIVE METABOLIC PANEL
AST: 16 U/L (ref 0–37)
Alkaline Phosphatase: 80 U/L (ref 39–117)
BUN: 16 mg/dL (ref 6–23)
Calcium: 9.8 mg/dL (ref 8.4–10.5)
Chloride: 103 mEq/L (ref 96–112)
Creat: 0.97 mg/dL (ref 0.50–1.10)
Total Bilirubin: 0.5 mg/dL (ref 0.3–1.2)

## 2012-09-07 LAB — LIPID PANEL
HDL: 36 mg/dL — ABNORMAL LOW (ref >39)
Total CHOL/HDL Ratio: 3.9 Ratio
VLDL: 44 mg/dL — ABNORMAL HIGH (ref 0–40)

## 2012-09-10 ENCOUNTER — Encounter: Payer: Self-pay | Admitting: Family Medicine

## 2012-09-10 ENCOUNTER — Ambulatory Visit (INDEPENDENT_AMBULATORY_CARE_PROVIDER_SITE_OTHER): Payer: Medicare Other | Admitting: Family Medicine

## 2012-09-10 VITALS — BP 110/62 | HR 72 | Resp 18 | Ht 65.0 in | Wt 172.0 lb

## 2012-09-10 DIAGNOSIS — E669 Obesity, unspecified: Secondary | ICD-10-CM

## 2012-09-10 DIAGNOSIS — E039 Hypothyroidism, unspecified: Secondary | ICD-10-CM

## 2012-09-10 DIAGNOSIS — R413 Other amnesia: Secondary | ICD-10-CM

## 2012-09-10 DIAGNOSIS — Z0000 Encounter for general adult medical examination without abnormal findings: Secondary | ICD-10-CM

## 2012-09-10 DIAGNOSIS — E785 Hyperlipidemia, unspecified: Secondary | ICD-10-CM

## 2012-09-10 DIAGNOSIS — R7301 Impaired fasting glucose: Secondary | ICD-10-CM

## 2012-09-10 NOTE — Patient Instructions (Addendum)
F/U in 4.5  Month  Please check re TDAP and zostavax at the pharmacy  Labs are excellent   No med changes  You will get MOST form to review and consider naming legally 1 more of your children as health care power of attorney   You need to take calcium with D, and 1 multivitamin  Use saline flushes to nostril

## 2012-09-10 NOTE — Progress Notes (Signed)
Subjective:    Patient ID: [name], female    DOB: 1926/03/18, 79 y.o.   MRN: [id number]  HPI Preventive Screening-Counseling & Management   Patient present here today for a Medicare annual wellness visit.   Current Problems (verified)   Medications Prior to Visit Allergies (verified)   PAST HISTORY  Family History  Social History Widow  In 1989, mom of 6 children. No cigarette since teen, no alcohol , street drugs Worked in bakery to age 89 for 10 years   Risk Factors  Current exercise habits:    Dietary issues discussed:   Cardiac risk factors:   Depression Screen  (Note: if answer to either of the following is "Yes", a more complete depression screening is indicated)   Over the past two weeks, have you felt down, depressed or hopeless? No  Over the past two weeks, have you felt little interest or pleasure in doing things? No  Have you lost interest or pleasure in daily life? No  Do you often feel hopeless? No  Do you cry easily over simple problems? No   Activities of Daily Living  In your present state of health, do you have any difficulty performing the following activities?  Driving?: No driving since [id number] since vision loss Managing money?: No Feeding yourself?:No Getting from bed to chair?:No Climbing a flight of stairs?:yes uses a cane Preparing food and eating?:No Bathing or showering?:No Getting dressed?:No Getting to the toilet?:No Using the toilet?:No Moving around from place to place?: No  Fall Risk Assessment In the past year have you fallen or had a near fall?:yes accidental Are you currently taking any medications that make you dizziness?:No   Hearing Difficulties: No Do you often ask people to speak up or repeat themselves?:sometimes Do you experience ringing or noises in your ears?:No Do you have difficulty understanding soft or whispered voices?:yes  Cognitive Testing  Alert? Yes Normal Appearance?Yes  Oriented to person? Yes  Place? Yes  Time? Yes  Displays appropriate judgment?Yes  Can read the correct time from a watch face? yes Are you having problems remembering things?No  Advanced Directives have been discussed with the patient?Yes , full code, will review MOST and has been encouraged    List the Names of Other Physician/Practitioners you currently use: Dr Lattie Haw, Dr Luan Pulling, Dr Rodena Piety   I  Assessment:    Annual Wellness Exam   Plan:    During the course of the visit the patient was educated and counseled about appropriate screening and preventive services including:  A healthy diet is rich in fruit, vegetables and whole grains. Poultry fish, nuts and beans are a healthy choice for protein rather then red meat. A low sodium diet and drinking 64 ounces of water daily is generally recommended. Oils and sweet should be limited. Carbohydrates especially for those who are diabetic or overweight, should be limited to 30-45 gram per meal. It is important to eat on a regular schedule, at least 3 times daily. Snacks should be primarily fruits, vegetables or nuts. It is important that you exercise regularly at least 30 minutes 5 times a week. If you develop chest pain, have severe difficulty breathing, or feel very tired, stop exercising immediately and seek medical attention  Immunization reviewed and updated. Cancer screening reviewed and updated    Patient Instructions (the written plan) was given to the patient.  Medicare Attestation  I have personally reviewed:  The patient's medical and social history  Their use of alcohol, tobacco or  illicit drugs  Their current medications and supplements  The patient's functional ability including ADLs,fall risks, home safety risks, cognitive, and hearing and visual impairment  Diet and physical activities  Evidence for depression or mood disorders  The patient's weight, height, BMI, and visual acuity have been recorded in the chart. I have made referrals,  counseling, and provided education to the patient based on review of the above and I have provided the patient with a written personalized care plan for preventive services.      Review of Systems     Objective:   Physical Exam        Assessment & Plan:

## 2012-09-15 DIAGNOSIS — Z0000 Encounter for general adult medical examination without abnormal findings: Secondary | ICD-10-CM | POA: Insufficient documentation

## 2012-09-15 NOTE — Assessment & Plan Note (Signed)
Annual wellness completed as documented. Safety re falls discussed. Though visually impaired, she is maintained in a safe home environment, no longer drives, and her family is very caring and supportive of her. She is encouraged to think about end of life issues, espescialy since she has 6 children. This is addressed at visit, and will be further visited by the pt and her family

## 2012-10-02 ENCOUNTER — Ambulatory Visit (INDEPENDENT_AMBULATORY_CARE_PROVIDER_SITE_OTHER): Payer: Medicare Other | Admitting: *Deleted

## 2012-10-02 DIAGNOSIS — I4891 Unspecified atrial fibrillation: Secondary | ICD-10-CM

## 2012-10-02 DIAGNOSIS — Z7901 Long term (current) use of anticoagulants: Secondary | ICD-10-CM

## 2012-10-02 DIAGNOSIS — I480 Paroxysmal atrial fibrillation: Secondary | ICD-10-CM

## 2012-10-30 ENCOUNTER — Ambulatory Visit (INDEPENDENT_AMBULATORY_CARE_PROVIDER_SITE_OTHER): Payer: Medicare Other | Admitting: *Deleted

## 2012-10-30 DIAGNOSIS — Z7901 Long term (current) use of anticoagulants: Secondary | ICD-10-CM

## 2012-10-30 DIAGNOSIS — I4891 Unspecified atrial fibrillation: Secondary | ICD-10-CM

## 2012-10-30 DIAGNOSIS — I480 Paroxysmal atrial fibrillation: Secondary | ICD-10-CM

## 2012-10-30 LAB — POCT INR: INR: 2.6

## 2012-11-12 ENCOUNTER — Emergency Department (HOSPITAL_COMMUNITY)
Admission: EM | Admit: 2012-11-12 | Discharge: 2012-11-12 | Disposition: A | Discharge status: Home / Self Care | Payer: Medicare Other | Attending: Emergency Medicine | Admitting: Emergency Medicine

## 2012-11-12 ENCOUNTER — Telehealth: Payer: Self-pay | Admitting: Family Medicine

## 2012-11-12 ENCOUNTER — Encounter (HOSPITAL_COMMUNITY): Payer: Self-pay

## 2012-11-12 DIAGNOSIS — IMO0002 Reserved for concepts with insufficient information to code with codable children: Secondary | ICD-10-CM | POA: Insufficient documentation

## 2012-11-12 DIAGNOSIS — I10 Essential (primary) hypertension: Secondary | ICD-10-CM | POA: Insufficient documentation

## 2012-11-12 DIAGNOSIS — E039 Hypothyroidism, unspecified: Secondary | ICD-10-CM | POA: Insufficient documentation

## 2012-11-12 DIAGNOSIS — H9190 Unspecified hearing loss, unspecified ear: Secondary | ICD-10-CM | POA: Insufficient documentation

## 2012-11-12 DIAGNOSIS — K219 Gastro-esophageal reflux disease without esophagitis: Secondary | ICD-10-CM | POA: Insufficient documentation

## 2012-11-12 DIAGNOSIS — Z8739 Personal history of other diseases of the musculoskeletal system and connective tissue: Secondary | ICD-10-CM | POA: Insufficient documentation

## 2012-11-12 DIAGNOSIS — Z8709 Personal history of other diseases of the respiratory system: Secondary | ICD-10-CM | POA: Insufficient documentation

## 2012-11-12 DIAGNOSIS — Z87891 Personal history of nicotine dependence: Secondary | ICD-10-CM | POA: Insufficient documentation

## 2012-11-12 DIAGNOSIS — I4891 Unspecified atrial fibrillation: Secondary | ICD-10-CM | POA: Insufficient documentation

## 2012-11-12 DIAGNOSIS — Z8669 Personal history of other diseases of the nervous system and sense organs: Secondary | ICD-10-CM | POA: Insufficient documentation

## 2012-11-12 DIAGNOSIS — Z79899 Other long term (current) drug therapy: Secondary | ICD-10-CM | POA: Insufficient documentation

## 2012-11-12 DIAGNOSIS — R112 Nausea with vomiting, unspecified: Secondary | ICD-10-CM | POA: Insufficient documentation

## 2012-11-12 DIAGNOSIS — Z8719 Personal history of other diseases of the digestive system: Secondary | ICD-10-CM | POA: Insufficient documentation

## 2012-11-12 DIAGNOSIS — Z950 Presence of cardiac pacemaker: Secondary | ICD-10-CM | POA: Insufficient documentation

## 2012-11-12 DIAGNOSIS — E785 Hyperlipidemia, unspecified: Secondary | ICD-10-CM | POA: Insufficient documentation

## 2012-11-12 DIAGNOSIS — Z7901 Long term (current) use of anticoagulants: Secondary | ICD-10-CM | POA: Insufficient documentation

## 2012-11-12 DIAGNOSIS — Z8639 Personal history of other endocrine, nutritional and metabolic disease: Secondary | ICD-10-CM | POA: Insufficient documentation

## 2012-11-12 DIAGNOSIS — R197 Diarrhea, unspecified: Secondary | ICD-10-CM | POA: Insufficient documentation

## 2012-11-12 DIAGNOSIS — R011 Cardiac murmur, unspecified: Secondary | ICD-10-CM | POA: Insufficient documentation

## 2012-11-12 DIAGNOSIS — Z862 Personal history of diseases of the blood and blood-forming organs and certain disorders involving the immune mechanism: Secondary | ICD-10-CM | POA: Insufficient documentation

## 2012-11-12 LAB — CBC WITH DIFFERENTIAL/PLATELET
Eosinophils Relative: 0 % (ref 0–5)
HCT: 34.6 % — ABNORMAL LOW (ref 36.0–46.0)
Lymphocytes Relative: 6 % — ABNORMAL LOW (ref 12–46)
Lymphs Abs: 0.9 10*3/uL (ref 0.7–4.0)
MCV: 86.5 fL (ref 78.0–100.0)
Monocytes Absolute: 0.6 10*3/uL (ref 0.1–1.0)
Monocytes Relative: 4 % (ref 3–12)
RBC: 4 MIL/uL (ref 3.87–5.11)
WBC: 14.2 10*3/uL — ABNORMAL HIGH (ref 4.0–10.5)

## 2012-11-12 LAB — COMPREHENSIVE METABOLIC PANEL
ALT: 14 U/L (ref 0–35)
CO2: 25 mEq/L (ref 19–32)
Calcium: 9.4 mg/dL (ref 8.4–10.5)
Creatinine, Ser: 1 mg/dL (ref 0.50–1.10)
GFR calc Af Amer: 58 mL/min — ABNORMAL LOW (ref >90)
GFR calc non Af Amer: 50 mL/min — ABNORMAL LOW (ref >90)
Glucose, Bld: 125 mg/dL — ABNORMAL HIGH (ref 70–99)

## 2012-11-12 LAB — PROTIME-INR: INR: 2.21 — ABNORMAL HIGH (ref 0.00–1.49)

## 2012-11-12 MED ORDER — ONDANSETRON 8 MG PO TBDP: 8.0000 mg | ORAL_TABLET | Freq: Three times a day (TID) | ORAL | Status: DC | PRN

## 2012-11-12 NOTE — Telephone Encounter (Signed)
From what she describes, she needs to take her Mom to the Ed for eval, weak , sleepy and vomitting

## 2012-11-12 NOTE — Telephone Encounter (Signed)
Patient is refusing to go to the ED.

## 2012-11-12 NOTE — ED Notes (Signed)
She has been having vomiting, diarrhea, her pulse has been fast, her MD suggested that she come over here.

## 2012-11-12 NOTE — ED Notes (Signed)
Patient states she vomited at 0600, but has not vomited since 10am.  Patient had a few episodes of diarrhea, but has not had one since 12pm.  Daughter states patient has not had her medications and that she called Dr. Moshe Cipro, who stated to come to the ER to check for dehydration.  Patient denies abdominal pain, nausea, vomiting or diarrhea at this time.

## 2012-11-12 NOTE — ED Notes (Signed)
Patient sitting up in bed drinking Ginger Ale.  Patient states she is ready to go home.

## 2012-11-12 NOTE — ED Provider Notes (Signed)
History     CSN: [id number]  Arrival date & time 11/12/12  1944   First MD Initiated Contact with Patient 11/12/12 1959      Chief Complaint  Patient presents with  . Diarrhea  . Emesis    (Consider location/radiation/quality/duration/timing/severity/associated sxs/prior treatment) HPI  Patient reports she was awakened about 6 AM this morning and had a prolonged episode of vomiting. She had another episode around 10 AM and has not had any further vomiting today. She also had about 2 episodes of diarrhea the last was at 10:00 this morning. She denies any abdominal pain. Her daughter gave her nausea suppositories and she's been drinking ginger ale and eating crackers. Daughter states about 6 PM her temperature was 101 and she gave her Tylenol. Patient denies cough, chest pain, shortness of breath. She states she last urinated about 6 PM. She denies seeing any blood in her vomitus or blood in her diarrhea or having melena. Nobody else at home has been sick. Daughter reports her PCP called at 6:30 this afternoon and told her to bring her to the hospital to make sure she wasn't dehydrated.   PCP Dr Moshe Cipro  Past Medical History  Diagnosis Date  . Hypertension   . Hyperlipidemia   . Pancreatitis   . Gastroesophageal reflux disease   . Impaired glucose tolerance   . Hypothyroidism   . Macular degeneration   . Sleep apnea     Nocturnal oxygen therapy  . Arthritis   . Sick sinus syndrome 2011    Atrial fibrilation 10/2009; and dual chamber pacemaker in 4/11; normal EF  . Paroxysmal atrial fibrillation 2011  . Hearing impairment     Past Surgical History  Procedure Laterality Date  . Abdominal hysterectomy    . Salpingoophorectomy  1970    right  . Cataract extraction      Bilateral  . Pacemaker insertion  2011    dual chamber   . Colonoscopy  2009    Dr. Gala Romney  . Eye surgery      laser     Family History  Problem Relation Age of Onset  . Cancer Mother     gential  .  Cancer Father     throat   . Pneumonia Sister   . Heart disease    . Arthritis    . Lung disease    . Asthma      History  Substance Use Topics  . Smoking status: Former Smoker    Types: Cigarettes  . Smokeless tobacco: Not on file  . Alcohol Use: No  lives with daughter  OB History   Grav Para Term Preterm Abortions TAB SAB Ect Mult Living                  Review of Systems  All other systems reviewed and are negative.    Allergies  Review of patient's allergies indicates no known allergies.  Home Medications   Current Outpatient Rx  Name  Route  Sig  Dispense  Refill  . digoxin (LANOXIN) 0.125 MG tablet   Oral   Take 0.125 mg by mouth daily.         . hydrochlorothiazide (HYDRODIURIL) 25 MG tablet   Oral   Take 25 mg by mouth daily.         Marland Kitchen levothyroxine (SYNTHROID) 100 MCG tablet   Oral   Take 100-150 mcg by mouth daily. TAKE 1 TABLET BY MOUTH MONDAY THROUGH FRIDAY AND TAKE (1  AND 1/2) TABLET ON SATURDAY AND SUNDAY AS DIRECTED FOR THYROID.         Marland Kitchen lovastatin (MEVACOR) 40 MG tablet   Oral   Take 80 mg by mouth at bedtime. TAKE 2 TABLETS BY MOUTH AT BEDTIME.         . metoprolol (LOPRESSOR) 100 MG tablet   Oral   Take 100 mg by mouth 2 (two) times daily.         . multivitamin-lutein (OCUVITE-LUTEIN) CAPS   Oral   Take 1 capsule by mouth daily.         Marland Kitchen omeprazole (PRILOSEC) 20 MG capsule   Oral   Take 20 mg by mouth daily. TAKE (1) CAPSULE BY MOUTH ONCE DAILY FOR ACID REFLUX.         Marland Kitchen OXYGEN-HELIUM IN   Inhalation   Inhale 2 L into the lungs at bedtime.         . Potassium 99 MG TABS   Oral   Take 99 mg by mouth every morning.          . warfarin (COUMADIN) 5 MG tablet   Oral   Take 2.5-5 mg by mouth as directed. *Take one-half tablet on Mondays and Thursdays, then take one tablet on all other days         . fluticasone (FLONASE) 50 MCG/ACT nasal spray   Nasal   Place 1 spray into the nose daily.            BP 106/49  Pulse 82  Temp(Src) 98.5 F (36.9 C) (Oral)  Resp 18  Ht 5\' 6"  (1.676 m)  Wt 170 lb (77.111 kg)  BMI 27.45 kg/m2  SpO2 97%  Vital signs normal   Orthostatic vital signs show blood pressure systolic dropped from [id number] on lying to 106 on standing and her pulse rises from 70 lying to 82 on standing.   Physical Exam  Nursing note and vitals reviewed. Constitutional: She is oriented to person, place, and time. She appears well-developed and well-nourished.  Non-toxic appearance. She does not appear ill. No distress.  pleasant  HENT:  Head: Normocephalic and atraumatic.  Right Ear: External ear normal.  Left Ear: External ear normal.  Nose: Nose normal. No mucosal edema or rhinorrhea.  Mouth/Throat: Oropharynx is clear and moist and mucous membranes are normal. No dental abscesses or edematous.  Eyes: Conjunctivae and EOM are normal. Pupils are equal, round, and reactive to light.  Neck: Normal range of motion and full passive range of motion without pain. Neck supple.  Cardiovascular: Normal rate and regular rhythm.  Exam reveals no gallop and no friction rub.   Murmur heard. Faint systolic murmer heard best in LSB  Pulmonary/Chest: Effort normal and breath sounds normal. No respiratory distress. She has no wheezes. She has no rhonchi. She has no rales. She exhibits no tenderness and no crepitus.  Abdominal: Soft. Normal appearance and bowel sounds are normal. She exhibits no distension. There is no tenderness. There is no rebound and no guarding.  Musculoskeletal: Normal range of motion. She exhibits no edema and no tenderness.  Moves all extremities well.   Neurological: She is alert and oriented to person, place, and time. She has normal strength. No cranial nerve deficit.  Skin: Skin is warm, dry and intact. No rash noted. No erythema. No pallor.  Psychiatric: She has a normal mood and affect. Her speech is normal and behavior is normal. Her mood appears not anxious.  ED Course  Procedures (including critical care time)  Patient has drank 2 different types of canned drinks and water. She has no nausea or vomiting. Patient states she will not have to urinate for another couple hours. She wants to go home.   Results for orders placed during the hospital encounter of 11/12/12  CBC WITH DIFFERENTIAL      Result Value Range   WBC 14.2 (*) 4.0 - 10.5 K/uL   RBC 4.00  3.87 - 5.11 MIL/uL   Hemoglobin 12.0  12.0 - 15.0 g/dL   HCT 34.6 (*) 36.0 - 46.0 %   MCV 86.5  78.0 - 100.0 fL   MCH 30.0  26.0 - 34.0 pg   MCHC 34.7  30.0 - 36.0 g/dL   RDW 13.3  11.5 - 15.5 %   Platelets 253  150 - 400 K/uL   Neutrophils Relative 90 (*) 43 - 77 %   Neutro Abs 12.7 (*) 1.7 - 7.7 K/uL   Lymphocytes Relative 6 (*) 12 - 46 %   Lymphs Abs 0.9  0.7 - 4.0 K/uL   Monocytes Relative 4  3 - 12 %   Monocytes Absolute 0.6  0.1 - 1.0 K/uL   Eosinophils Relative 0  0 - 5 %   Eosinophils Absolute 0.0  0.0 - 0.7 K/uL   Basophils Relative 0  0 - 1 %   Basophils Absolute 0.0  0.0 - 0.1 K/uL  COMPREHENSIVE METABOLIC PANEL      Result Value Range   Sodium 136  135 - 145 mEq/L   Potassium 3.6  3.5 - 5.1 mEq/L   Chloride 97  96 - 112 mEq/L   CO2 25  19 - 32 mEq/L   Glucose, Bld 125 (*) 70 - 99 mg/dL   BUN 24 (*) 6 - 23 mg/dL   Creatinine, Ser 1.00  0.50 - 1.10 mg/dL   Calcium 9.4  8.4 - 10.5 mg/dL   Total Protein 7.3  6.0 - 8.3 g/dL   Albumin 3.5  3.5 - 5.2 g/dL   AST 16  0 - 37 U/L   ALT 14  0 - 35 U/L   Alkaline Phosphatase 84  39 - 117 U/L   Total Bilirubin 0.4  0.3 - 1.2 mg/dL   GFR calc non Af Amer 50 (*) >90 mL/min   GFR calc Af Amer 58 (*) >90 mL/min  PROTIME-INR      Result Value Range   Prothrombin Time 23.6 (*) 11.6 - 15.2 seconds   INR 2.21 (*) 0.00 - 1.49   Laboratory interpretation all normal except leukocytosis, therapeutic INR    1. Nausea vomiting and diarrhea    New Prescriptions   ONDANSETRON (ZOFRAN ODT) 8 MG DISINTEGRATING TABLET    Take 1  tablet (8 mg total) by mouth every 8 (eight) hours as needed for nausea.    Plan discharge  Rolland Porter, MD, [place], MD 11/12/12 2151

## 2012-11-12 NOTE — ED Notes (Signed)
Patient tolerated 2 cups of Ginger Ale with no abdominal pain, nausea, vomiting, or diarrhea noted.  Discharge instructions given and reviewed with patient and daughter.  Prescription given for Zofran; daughter stated that patient was more comfortable using daughter's suppositories.  Patient discharged home in good condition via wheelchair.

## 2012-11-12 NOTE — ED Notes (Signed)
Patient given a Ginger Ale.

## 2012-11-12 NOTE — Telephone Encounter (Signed)
Called back again and advised Ed eval, pt still refusing.  Please order stat cbc and diff and chem 7 for her to get the labs in the morning, call the daughter and let her know when lab has been sent over

## 2012-11-12 NOTE — Telephone Encounter (Signed)
Please advise 

## 2012-11-13 NOTE — Telephone Encounter (Signed)
Noted that patient went to ED.

## 2012-11-27 ENCOUNTER — Ambulatory Visit (INDEPENDENT_AMBULATORY_CARE_PROVIDER_SITE_OTHER): Payer: Medicare Other | Admitting: *Deleted

## 2012-11-27 DIAGNOSIS — Z7901 Long term (current) use of anticoagulants: Secondary | ICD-10-CM

## 2012-11-27 DIAGNOSIS — I480 Paroxysmal atrial fibrillation: Secondary | ICD-10-CM

## 2012-11-27 DIAGNOSIS — I4891 Unspecified atrial fibrillation: Secondary | ICD-10-CM

## 2012-12-16 ENCOUNTER — Encounter: Payer: Self-pay | Admitting: Internal Medicine

## 2012-12-16 ENCOUNTER — Encounter: Payer: Self-pay | Admitting: Cardiology

## 2012-12-16 ENCOUNTER — Ambulatory Visit (INDEPENDENT_AMBULATORY_CARE_PROVIDER_SITE_OTHER): Payer: Medicare Other | Admitting: Internal Medicine

## 2012-12-16 VITALS — BP 142/70 | HR 60 | Ht 66.0 in | Wt 172.4 lb

## 2012-12-16 DIAGNOSIS — I10 Essential (primary) hypertension: Secondary | ICD-10-CM

## 2012-12-16 DIAGNOSIS — I495 Sick sinus syndrome: Secondary | ICD-10-CM

## 2012-12-16 DIAGNOSIS — Z950 Presence of cardiac pacemaker: Secondary | ICD-10-CM

## 2012-12-16 LAB — PACEMAKER DEVICE OBSERVATION
AL IMPEDENCE PM: 362.5 Ohm
BAMS-0003: 70 {beats}/min
BATTERY VOLTAGE: 2.9328 V
RV LEAD AMPLITUDE: 8.5 mv

## 2012-12-16 NOTE — Assessment & Plan Note (Signed)
Her blood pressure is slightly elevated. I've encouraged the patient to reduce her salt intake. She will continue her current medical therapy.

## 2012-12-16 NOTE — Patient Instructions (Signed)
Your physician wants you to follow-up in: 6 months with [facility]. You will receive a reminder letter in the mail two months in advance. If you don't receive a letter, please call our office to schedule the follow-up appointment.  Your physician wants you to follow-up in: 1 year with Dr. Lovena Le. You will receive a reminder letter in the mail two months in advance. If you don't receive a letter, please call our office to schedule the follow-up appointment.

## 2012-12-16 NOTE — Assessment & Plan Note (Signed)
The patient's pacemaker is working normally , however her atrial lead pacing threshold is elevated a bit. Today we have increased her outputs. We'll plan to recheck in several months.

## 2012-12-16 NOTE — Progress Notes (Signed)
HPI [name] returns today for followup. She is a very pleasant 79 year old woman with a history of symptomatic bradycardia, status post permanent pacemaker insertion, hypertension, and paroxysmal atrial fibrillation. In the interim, she has been stable. She denies chest pain or shortness of breath. No syncope. No peripheral edema. She has a history of falls but has had none recently.  No Known Allergies   Current Outpatient Prescriptions  Medication Sig Dispense Refill  . digoxin (LANOXIN) 0.125 MG tablet Take 0.125 mg by mouth daily.      . fluticasone (FLONASE) 50 MCG/ACT nasal spray Place 1 spray into the nose daily.      . hydrochlorothiazide (HYDRODIURIL) 25 MG tablet Take 25 mg by mouth daily.      Marland Kitchen levothyroxine (SYNTHROID) 100 MCG tablet Take 100-150 mcg by mouth daily. TAKE 1 TABLET BY MOUTH MONDAY THROUGH FRIDAY AND TAKE (1 AND 1/2) TABLET ON SATURDAY AND SUNDAY AS DIRECTED FOR THYROID.      Marland Kitchen lovastatin (MEVACOR) 40 MG tablet Take 80 mg by mouth at bedtime. TAKE 2 TABLETS BY MOUTH AT BEDTIME.      . metoprolol (LOPRESSOR) 100 MG tablet Take 100 mg by mouth 2 (two) times daily.      . multivitamin-lutein (OCUVITE-LUTEIN) CAPS Take 1 capsule by mouth daily.      Marland Kitchen omeprazole (PRILOSEC) 20 MG capsule Take 20 mg by mouth daily. TAKE (1) CAPSULE BY MOUTH ONCE DAILY FOR ACID REFLUX.      Marland Kitchen OXYGEN-HELIUM IN Inhale 2 L into the lungs at bedtime.      . Potassium 99 MG TABS Take 99 mg by mouth every morning.       . warfarin (COUMADIN) 5 MG tablet Take 2.5-5 mg by mouth as directed. *Take one-half tablet on Mondays and Thursdays, then take one tablet on all other days       No current facility-administered medications for this visit.     Past Medical History  Diagnosis Date  . Hypertension   . Hyperlipidemia   . Pancreatitis   . Gastroesophageal reflux disease   . Impaired glucose tolerance   . Hypothyroidism   . Macular degeneration   . Sleep apnea     Nocturnal oxygen therapy   . Arthritis   . Sick sinus syndrome 2011    Atrial fibrilation 10/2009; and dual chamber pacemaker in 4/11; normal EF  . Paroxysmal atrial fibrillation 2011  . Hearing impairment     ROS:   All systems reviewed and negative except as noted in the HPI.   Past Surgical History  Procedure Laterality Date  . Abdominal hysterectomy    . Salpingoophorectomy  1970    right  . Cataract extraction      Bilateral  . Pacemaker insertion  2011    dual chamber   . Colonoscopy  2009    Dr. Gala Romney  . Eye surgery      laser      Family History  Problem Relation Age of Onset  . Cancer Mother     gential  . Cancer Father     throat   . Pneumonia Sister   . Heart disease    . Arthritis    . Lung disease    . Asthma       History   Social History  . Marital Status: Widowed    Spouse Name: N/A    Number of Children: 7  . Years of Education: college   Occupational History  .  retired     Social History Main Topics  . Smoking status: Never Smoker   . Smokeless tobacco: Not on file  . Alcohol Use: No  . Drug Use: Not on file  . Sexually Active: Not on file   Other Topics Concern  . Not on file   Social History Narrative  . No narrative on file     BP 142/70  Pulse 60  Ht 5\' 6"  (1.676 m)  Wt 172 lb 6.4 oz (78.2 kg)  BMI 27.84 kg/m2  SpO2 97%  Physical Exam:  Well appearing 79 year old woman, NAD HEENT: Unremarkable Neck:  No JVD, no thyromegally Back:  No CVA tenderness Lungs:  Clear with no wheezes, rales, or rhonchi. HEART:  Regular rate rhythm, no murmurs, no rubs, no clicks Abd:  soft, positive bowel sounds, no organomegally, no rebound, no guarding Ext:  2 plus pulses, no edema, no cyanosis, no clubbing Skin:  No rashes no nodules Neuro:  CN II through XII intact, motor grossly intact  DEVICE  Normal device function.  See PaceArt for details.   Assess/Plan:

## 2012-12-17 ENCOUNTER — Encounter: Payer: Self-pay | Admitting: Internal Medicine

## 2012-12-20 ENCOUNTER — Ambulatory Visit: Payer: Self-pay | Admitting: Family Medicine

## 2013-01-15 ENCOUNTER — Other Ambulatory Visit: Payer: Self-pay | Admitting: Family Medicine

## 2013-01-15 DIAGNOSIS — Z139 Encounter for screening, unspecified: Secondary | ICD-10-CM

## 2013-01-16 ENCOUNTER — Ambulatory Visit (INDEPENDENT_AMBULATORY_CARE_PROVIDER_SITE_OTHER): Payer: Medicare Other | Admitting: *Deleted

## 2013-01-16 DIAGNOSIS — I4891 Unspecified atrial fibrillation: Secondary | ICD-10-CM

## 2013-01-16 DIAGNOSIS — Z7901 Long term (current) use of anticoagulants: Secondary | ICD-10-CM

## 2013-01-16 DIAGNOSIS — I480 Paroxysmal atrial fibrillation: Secondary | ICD-10-CM

## 2013-01-16 LAB — POCT INR: INR: 3.6

## 2013-01-22 ENCOUNTER — Other Ambulatory Visit: Payer: Self-pay | Admitting: Family Medicine

## 2013-01-22 ENCOUNTER — Other Ambulatory Visit: Payer: Self-pay | Admitting: Cardiology

## 2013-01-22 MED ORDER — DIGOXIN 125 MCG PO TABS: 0.1250 mg | ORAL_TABLET | Freq: Every day | ORAL | Status: DC

## 2013-01-23 ENCOUNTER — Ambulatory Visit (HOSPITAL_COMMUNITY): Payer: Self-pay

## 2013-01-30 LAB — COMPREHENSIVE METABOLIC PANEL
ALT: 12 U/L (ref 0–35)
AST: 15 U/L (ref 0–37)
CO2: 27 mEq/L (ref 19–32)
Creat: 0.91 mg/dL (ref 0.50–1.10)
Total Bilirubin: 0.6 mg/dL (ref 0.3–1.2)

## 2013-01-30 LAB — HEMOGLOBIN A1C
Hgb A1c MFr Bld: 5.7 % — ABNORMAL HIGH (ref <5.7)
Mean Plasma Glucose: 117 mg/dL — ABNORMAL HIGH (ref <117)

## 2013-01-30 LAB — LIPID PANEL
LDL Cholesterol: 48 mg/dL (ref 0–99)
Total CHOL/HDL Ratio: 3.7 Ratio
VLDL: 45 mg/dL — ABNORMAL HIGH (ref 0–40)

## 2013-02-05 ENCOUNTER — Encounter: Payer: Self-pay | Admitting: Family Medicine

## 2013-02-05 ENCOUNTER — Ambulatory Visit (INDEPENDENT_AMBULATORY_CARE_PROVIDER_SITE_OTHER): Payer: Medicare Other | Admitting: Family Medicine

## 2013-02-05 VITALS — BP 130/62 | HR 73 | Resp 16 | Ht 65.0 in | Wt 173.4 lb

## 2013-02-05 DIAGNOSIS — J42 Unspecified chronic bronchitis: Secondary | ICD-10-CM | POA: Insufficient documentation

## 2013-02-05 DIAGNOSIS — I10 Essential (primary) hypertension: Secondary | ICD-10-CM

## 2013-02-05 DIAGNOSIS — E039 Hypothyroidism, unspecified: Secondary | ICD-10-CM

## 2013-02-05 DIAGNOSIS — J322 Chronic ethmoidal sinusitis: Secondary | ICD-10-CM

## 2013-02-05 DIAGNOSIS — E785 Hyperlipidemia, unspecified: Secondary | ICD-10-CM

## 2013-02-05 MED ORDER — BENZONATATE 100 MG PO CAPS: 100.0000 mg | ORAL_CAPSULE | Freq: Four times a day (QID) | ORAL | Status: DC | PRN

## 2013-02-05 MED ORDER — AZITHROMYCIN 250 MG PO TABS: ORAL_TABLET | ORAL | Status: AC

## 2013-02-05 NOTE — Patient Instructions (Addendum)
F/U in 4 month   Fasting lipid, cmp, TSH in 4.5 month, call if you need me before  You will get a zpack for head and chest congestion  Reduce pravachol to ONE at bedtime, cut back on red meat , fried and fatty foods please

## 2013-02-05 NOTE — Progress Notes (Signed)
  Subjective:    Patient ID: [name], female    DOB: 08/04/1926, 79 y.o.   MRN: [id number]  HPI The PT is here for follow up and re-evaluation of chronic medical conditions, medication management and review of any available recent lab and radiology data.  Preventive health is updated, specifically  Cancer screening and Immunization.   Questions or concerns regarding consultations or procedures which the PT has had in the interim are  addressed. The PT denies any adverse reactions to current medications since the last visit.  3 day h/o increased sinus drainage , clear, facial pressure, sore throat, excessive cough and posisitive sick exposure    Review of Systems See HPI Denies recent fever, has had  chills.  Denies chest pains, palpitations and leg swelling Denies abdominal pain, nausea, vomiting,diarrhea or constipation.   Denies dysuria, frequency, hesitancy or incontinence. Denies joint pain, swelling and limitation in mobility. Denies headaches, seizures, numbness, or tingling. Denies depression, anxiety or insomnia. Denies skin break down or rash.        Objective:   Physical Exam  Patient alert and oriented and in no cardiopulmonary distress.  HEENT: No facial asymmetry, EOMI, ethmoid  sinus tenderness,  oropharynx pink and moist.  Neck supple no adenopathy.  Chest: decreased though adequate air entry, scattered crackles and wheezes CVS: S1, S2 no murmurs, no S3.  ABD: Soft non tender. Bowel sounds normal.  Ext: No edema  MS: Adequate ROM spine, shoulders, hips and knees.  Skin: Intact, no ulcerations or rash noted.  Psych: Good eye contact, normal affect. Memory intact not anxious or depressed appearing.  CNS: CN 2-12 intact, power, tone and sensation normal throughout.       Assessment & Plan:

## 2013-02-06 ENCOUNTER — Ambulatory Visit (HOSPITAL_COMMUNITY)
Admission: RE | Admit: 2013-02-06 | Discharge: 2013-02-06 | Disposition: A | Discharge status: Home / Self Care | Payer: Medicare Other | Source: Ambulatory Visit | Attending: Family Medicine | Admitting: Family Medicine

## 2013-02-06 ENCOUNTER — Ambulatory Visit (INDEPENDENT_AMBULATORY_CARE_PROVIDER_SITE_OTHER): Payer: Medicare Other | Admitting: *Deleted

## 2013-02-06 DIAGNOSIS — I4891 Unspecified atrial fibrillation: Secondary | ICD-10-CM

## 2013-02-06 DIAGNOSIS — I480 Paroxysmal atrial fibrillation: Secondary | ICD-10-CM

## 2013-02-06 DIAGNOSIS — Z1231 Encounter for screening mammogram for malignant neoplasm of breast: Secondary | ICD-10-CM | POA: Insufficient documentation

## 2013-02-06 DIAGNOSIS — Z7901 Long term (current) use of anticoagulants: Secondary | ICD-10-CM

## 2013-02-06 DIAGNOSIS — Z139 Encounter for screening, unspecified: Secondary | ICD-10-CM

## 2013-02-06 LAB — POCT INR: INR: 3

## 2013-02-10 ENCOUNTER — Other Ambulatory Visit: Payer: Self-pay | Admitting: Family Medicine

## 2013-02-10 ENCOUNTER — Telehealth: Payer: Self-pay | Admitting: Family Medicine

## 2013-02-10 DIAGNOSIS — R059 Cough, unspecified: Secondary | ICD-10-CM

## 2013-02-10 DIAGNOSIS — R05 Cough: Secondary | ICD-10-CM

## 2013-02-10 MED ORDER — PREDNISONE 5 MG PO TABS: 5.0000 mg | ORAL_TABLET | Freq: Two times a day (BID) | ORAL | Status: DC

## 2013-02-10 NOTE — Telephone Encounter (Signed)
Daughter aware and med sent in

## 2013-02-10 NOTE — Telephone Encounter (Signed)
Finished her zpak and laid in bed feeling bad all day yesterday, still coughing (no mucus production) but still very hoarse- has to keep clearing throat.  I advised voice rest salt water gargles. Requested another antibiotic. Didn't want it to turn into pneumonia. Please advise

## 2013-02-10 NOTE — Telephone Encounter (Signed)
sopuinds more like uncontrolled allergies, clearing her throat. I advise prednisone 5mg  twice daily for 5 days, also since still c/o cough, CXR is being ordered. Pls send in med if she agrees to take it, I am ordering the CXR

## 2013-02-10 NOTE — Telephone Encounter (Signed)
Called patient and left message for them to return call at the office   

## 2013-02-11 ENCOUNTER — Other Ambulatory Visit: Payer: Self-pay

## 2013-02-11 ENCOUNTER — Ambulatory Visit (HOSPITAL_COMMUNITY)
Admission: RE | Admit: 2013-02-11 | Discharge: 2013-02-11 | Disposition: A | Discharge status: Home / Self Care | Payer: Medicare Other | Source: Ambulatory Visit | Attending: Family Medicine | Admitting: Family Medicine

## 2013-02-11 ENCOUNTER — Other Ambulatory Visit: Payer: Self-pay | Admitting: Family Medicine

## 2013-02-11 DIAGNOSIS — R059 Cough, unspecified: Secondary | ICD-10-CM | POA: Insufficient documentation

## 2013-02-11 DIAGNOSIS — R05 Cough: Secondary | ICD-10-CM

## 2013-02-11 MED ORDER — LOVASTATIN 40 MG PO TABS: ORAL_TABLET | ORAL | Status: DC

## 2013-02-11 MED ORDER — HYDROCHLOROTHIAZIDE 25 MG PO TABS: ORAL_TABLET | ORAL | Status: DC

## 2013-02-11 MED ORDER — LEVOTHYROXINE SODIUM 100 MCG PO TABS: ORAL_TABLET | ORAL | Status: DC

## 2013-02-11 MED ORDER — PENICILLIN V POTASSIUM 500 MG PO TABS: 500.0000 mg | ORAL_TABLET | Freq: Three times a day (TID) | ORAL | Status: DC

## 2013-02-11 MED ORDER — METOPROLOL TARTRATE 100 MG PO TABS: ORAL_TABLET | ORAL | Status: DC

## 2013-02-12 ENCOUNTER — Ambulatory Visit (INDEPENDENT_AMBULATORY_CARE_PROVIDER_SITE_OTHER): Payer: Medicare Other

## 2013-02-12 VITALS — BP 120/58 | Wt 170.1 lb

## 2013-02-12 DIAGNOSIS — J42 Unspecified chronic bronchitis: Secondary | ICD-10-CM

## 2013-02-12 DIAGNOSIS — J322 Chronic ethmoidal sinusitis: Secondary | ICD-10-CM

## 2013-02-12 MED ORDER — CEFTRIAXONE SODIUM 1 G IJ SOLR
500.0000 mg | Freq: Once | INTRAMUSCULAR | Status: AC
  Administered 2013-02-12: 500 mg via INTRAMUSCULAR

## 2013-02-12 NOTE — Progress Notes (Signed)
Patient in for injection.  Injection of Rocephin 500mg  given IM.  Site:  Right upper quadrant of gluteal.  No sign or symptom of adverse reaction.  No voiced complaint.  Patient will call if she feels no better.

## 2013-02-15 NOTE — Assessment & Plan Note (Signed)
Controlled, no change in medication DASH diet and commitment to daily physical activity for a minimum of 30 minutes discussed and encouraged, as a part of hypertension management. The importance of attaining a healthy weight is also discussed.  

## 2013-02-15 NOTE — Assessment & Plan Note (Signed)
Acute flare, antibiotic and decongestant prescribed

## 2013-02-15 NOTE — Assessment & Plan Note (Signed)
Controlled, no change in medication  

## 2013-02-15 NOTE — Assessment & Plan Note (Signed)
Antibiotic course prescribed 

## 2013-02-15 NOTE — Assessment & Plan Note (Signed)
pravachol dose reduced, Hyperlipidemia:Low fat diet discussed and encouraged.  Needs to reduce Tg intake

## 2013-02-20 ENCOUNTER — Ambulatory Visit (INDEPENDENT_AMBULATORY_CARE_PROVIDER_SITE_OTHER): Payer: Medicare Other | Admitting: *Deleted

## 2013-02-20 DIAGNOSIS — I480 Paroxysmal atrial fibrillation: Secondary | ICD-10-CM

## 2013-02-20 DIAGNOSIS — Z7901 Long term (current) use of anticoagulants: Secondary | ICD-10-CM

## 2013-02-20 DIAGNOSIS — I4891 Unspecified atrial fibrillation: Secondary | ICD-10-CM

## 2013-02-20 LAB — POCT INR
INR: 3.2
INR: 3.2

## 2013-03-20 ENCOUNTER — Ambulatory Visit (INDEPENDENT_AMBULATORY_CARE_PROVIDER_SITE_OTHER): Payer: Medicare Other | Admitting: *Deleted

## 2013-03-20 DIAGNOSIS — I4891 Unspecified atrial fibrillation: Secondary | ICD-10-CM

## 2013-03-20 DIAGNOSIS — I480 Paroxysmal atrial fibrillation: Secondary | ICD-10-CM

## 2013-03-20 DIAGNOSIS — Z7901 Long term (current) use of anticoagulants: Secondary | ICD-10-CM

## 2013-03-20 LAB — POCT INR: INR: 2

## 2013-04-04 ENCOUNTER — Ambulatory Visit (INDEPENDENT_AMBULATORY_CARE_PROVIDER_SITE_OTHER): Payer: Self-pay | Admitting: Ophthalmology

## 2013-04-23 ENCOUNTER — Other Ambulatory Visit: Payer: Self-pay | Admitting: Family Medicine

## 2013-04-23 ENCOUNTER — Ambulatory Visit (INDEPENDENT_AMBULATORY_CARE_PROVIDER_SITE_OTHER): Payer: Medicare Other | Admitting: *Deleted

## 2013-04-23 DIAGNOSIS — I480 Paroxysmal atrial fibrillation: Secondary | ICD-10-CM

## 2013-04-23 DIAGNOSIS — I4891 Unspecified atrial fibrillation: Secondary | ICD-10-CM

## 2013-04-23 DIAGNOSIS — Z7901 Long term (current) use of anticoagulants: Secondary | ICD-10-CM

## 2013-04-24 ENCOUNTER — Ambulatory Visit (INDEPENDENT_AMBULATORY_CARE_PROVIDER_SITE_OTHER): Payer: Medicare Other | Admitting: Ophthalmology

## 2013-04-24 DIAGNOSIS — I10 Essential (primary) hypertension: Secondary | ICD-10-CM

## 2013-04-24 DIAGNOSIS — H35039 Hypertensive retinopathy, unspecified eye: Secondary | ICD-10-CM

## 2013-04-24 DIAGNOSIS — H3530 Unspecified macular degeneration: Secondary | ICD-10-CM

## 2013-04-24 DIAGNOSIS — H43819 Vitreous degeneration, unspecified eye: Secondary | ICD-10-CM

## 2013-05-21 ENCOUNTER — Ambulatory Visit (INDEPENDENT_AMBULATORY_CARE_PROVIDER_SITE_OTHER): Payer: Medicare Other | Admitting: *Deleted

## 2013-05-21 ENCOUNTER — Other Ambulatory Visit: Payer: Self-pay | Admitting: Cardiology

## 2013-05-21 ENCOUNTER — Other Ambulatory Visit: Payer: Self-pay | Admitting: Family Medicine

## 2013-05-21 DIAGNOSIS — I480 Paroxysmal atrial fibrillation: Secondary | ICD-10-CM

## 2013-05-21 DIAGNOSIS — I4891 Unspecified atrial fibrillation: Secondary | ICD-10-CM

## 2013-05-21 DIAGNOSIS — Z7901 Long term (current) use of anticoagulants: Secondary | ICD-10-CM

## 2013-06-03 ENCOUNTER — Other Ambulatory Visit: Payer: Self-pay | Admitting: Family Medicine

## 2013-06-03 LAB — LIPID PANEL
Cholesterol: 154 mg/dL (ref 0–200)
HDL: 36 mg/dL — ABNORMAL LOW (ref >39)
LDL Cholesterol: 65 mg/dL (ref 0–99)
Total CHOL/HDL Ratio: 4.3 Ratio
Triglycerides: 266 mg/dL — ABNORMAL HIGH (ref <150)
VLDL: 53 mg/dL — ABNORMAL HIGH (ref 0–40)

## 2013-06-03 LAB — COMPREHENSIVE METABOLIC PANEL
Alkaline Phosphatase: 71 U/L (ref 39–117)
CO2: 29 mEq/L (ref 19–32)
Creat: 0.93 mg/dL (ref 0.50–1.10)
Glucose, Bld: 100 mg/dL — ABNORMAL HIGH (ref 70–99)
Total Bilirubin: 0.5 mg/dL (ref 0.3–1.2)

## 2013-06-09 ENCOUNTER — Encounter (INDEPENDENT_AMBULATORY_CARE_PROVIDER_SITE_OTHER): Payer: Self-pay

## 2013-06-09 ENCOUNTER — Ambulatory Visit (INDEPENDENT_AMBULATORY_CARE_PROVIDER_SITE_OTHER): Payer: Medicare Other | Admitting: Family Medicine

## 2013-06-09 ENCOUNTER — Encounter: Payer: Self-pay | Admitting: Family Medicine

## 2013-06-09 VITALS — BP 130/68 | HR 86 | Resp 18 | Ht 65.0 in | Wt 169.0 lb

## 2013-06-09 DIAGNOSIS — Z23 Encounter for immunization: Secondary | ICD-10-CM

## 2013-06-09 DIAGNOSIS — E785 Hyperlipidemia, unspecified: Secondary | ICD-10-CM

## 2013-06-09 DIAGNOSIS — E663 Overweight: Secondary | ICD-10-CM

## 2013-06-09 DIAGNOSIS — Z6825 Body mass index (BMI) 25.0-25.9, adult: Secondary | ICD-10-CM

## 2013-06-09 DIAGNOSIS — E559 Vitamin D deficiency, unspecified: Secondary | ICD-10-CM

## 2013-06-09 DIAGNOSIS — E039 Hypothyroidism, unspecified: Secondary | ICD-10-CM

## 2013-06-09 DIAGNOSIS — Z13820 Encounter for screening for osteoporosis: Secondary | ICD-10-CM

## 2013-06-09 DIAGNOSIS — R7301 Impaired fasting glucose: Secondary | ICD-10-CM

## 2013-06-09 DIAGNOSIS — I10 Essential (primary) hypertension: Secondary | ICD-10-CM

## 2013-06-09 MED ORDER — LOVASTATIN 40 MG PO TABS: 40.0000 mg | ORAL_TABLET | Freq: Every day | ORAL | Status: DC

## 2013-06-09 NOTE — Patient Instructions (Addendum)
Pelvic and breast early March , call if you need me before  Flu vaccine today  Cut back on butter, red meat, oil, cheese, triglycerides are too high  You are referred for a bone density test  Check your pharmacy for zostavax and also TdAP you need both  Fasting lipid, cmp and TSH and vit D early March ,before f/u

## 2013-06-15 NOTE — Assessment & Plan Note (Signed)
Controlled, no change in medication DASH diet and commitment to daily physical activity for a minimum of 30 minutes discussed and encouraged, as a part of hypertension management. The importance of attaining a healthy weight is also discussed.  

## 2013-06-15 NOTE — Progress Notes (Signed)
  Subjective:    Patient ID: [name], female    DOB: 12-22-1925, 79 y.o.   MRN: [id number]  HPI The PT is here for follow up and re-evaluation of chronic medical conditions, medication management and review of any available recent lab and radiology data.  Preventive health is updated, specifically  Cancer screening and Immunization.   Continues to have coumadin followed through the coumadin clinic The PT denies any adverse reactions to current medications since the last visit.  There are no new concerns.  There are no specific complaints       Review of Systems See HPI Denies recent fever or chills. Denies sinus pressure, nasal congestion, ear pain or sore throat. Denies chest congestion, productive cough or wheezing.Chronic cough non productive Denies chest pains, palpitations and leg swelling Denies abdominal pain, nausea, vomiting,diarrhea or constipation.   Denies dysuria, frequency, hesitancy or incontinence. Chronic  joint pain, and limitation in mobility. Denies headaches, seizures, numbness, or tingling. Denies depression, anxiety or insomnia. Denies skin break down or rash.        Objective:   Physical Exam  Patient alert and oriented and in no cardiopulmonary distress.  HEENT: No facial asymmetry, EOMI, no sinus tenderness,  oropharynx pink and moist.  Neck decreased though adeqaute ROM, no adenopathy.  Chest: Clear to auscultation bilaterally.Decreased though adequate air entry throughout  CVS: S1, S2 no murmurs, no S3.  ABD: Soft non tender. Bowel sounds normal.  Ext: No edema  [id number]  ROM spine, shoulders, hips and knees.  Skin: Intact, no ulcerations or rash noted.  Psych: Good eye contact, normal affect. Memory mild impairment not anxious or depressed appearing.  CNS: CN 2-12 intact, power, tone and sensation normal throughout.       Assessment & Plan:

## 2013-06-15 NOTE — Assessment & Plan Note (Signed)
Unchanged, but within appropriate weight for age

## 2013-06-15 NOTE — Assessment & Plan Note (Signed)
Deteriorated, no med change Hyperlipidemia:Low fat diet discussed and encouraged.

## 2013-06-15 NOTE — Assessment & Plan Note (Signed)
Controlled, no change in medication  

## 2013-06-18 ENCOUNTER — Other Ambulatory Visit (HOSPITAL_COMMUNITY): Payer: Self-pay

## 2013-06-18 ENCOUNTER — Ambulatory Visit (INDEPENDENT_AMBULATORY_CARE_PROVIDER_SITE_OTHER): Payer: Medicare Other | Admitting: *Deleted

## 2013-06-18 DIAGNOSIS — I480 Paroxysmal atrial fibrillation: Secondary | ICD-10-CM

## 2013-06-18 DIAGNOSIS — I4891 Unspecified atrial fibrillation: Secondary | ICD-10-CM

## 2013-06-18 DIAGNOSIS — Z7901 Long term (current) use of anticoagulants: Secondary | ICD-10-CM

## 2013-06-19 ENCOUNTER — Ambulatory Visit (HOSPITAL_COMMUNITY)
Admission: RE | Admit: 2013-06-19 | Discharge: 2013-06-19 | Disposition: A | Discharge status: Home / Self Care | Payer: Medicare Other | Source: Ambulatory Visit | Attending: Family Medicine | Admitting: Family Medicine

## 2013-06-19 DIAGNOSIS — M899 Disorder of bone, unspecified: Secondary | ICD-10-CM | POA: Insufficient documentation

## 2013-06-19 DIAGNOSIS — Z13820 Encounter for screening for osteoporosis: Secondary | ICD-10-CM

## 2013-06-23 ENCOUNTER — Telehealth: Payer: Self-pay

## 2013-06-23 NOTE — Telephone Encounter (Signed)
Concern addressed

## 2013-07-04 ENCOUNTER — Ambulatory Visit (INDEPENDENT_AMBULATORY_CARE_PROVIDER_SITE_OTHER): Payer: Medicare Other | Admitting: Cardiology

## 2013-07-04 ENCOUNTER — Encounter: Payer: Self-pay | Admitting: Cardiology

## 2013-07-04 ENCOUNTER — Ambulatory Visit (INDEPENDENT_AMBULATORY_CARE_PROVIDER_SITE_OTHER): Payer: Medicare Other | Admitting: *Deleted

## 2013-07-04 VITALS — BP 142/60 | HR 70 | Ht 66.0 in | Wt 169.0 lb

## 2013-07-04 DIAGNOSIS — I4891 Unspecified atrial fibrillation: Secondary | ICD-10-CM

## 2013-07-04 DIAGNOSIS — E785 Hyperlipidemia, unspecified: Secondary | ICD-10-CM

## 2013-07-04 DIAGNOSIS — I480 Paroxysmal atrial fibrillation: Secondary | ICD-10-CM

## 2013-07-04 DIAGNOSIS — I495 Sick sinus syndrome: Secondary | ICD-10-CM

## 2013-07-04 DIAGNOSIS — I10 Essential (primary) hypertension: Secondary | ICD-10-CM

## 2013-07-04 NOTE — Progress Notes (Signed)
PPM check 

## 2013-07-04 NOTE — Progress Notes (Signed)
Clinical Summary [name] is a 79 y.o.female former patient of Hayley [name], seen today for follow up of the following medical problems.  1. Paroxysmal afib - reports some occasional palps at night when she goes to bed, occurs nightly just a few seconds. No other associated symptoms - compliant with coumadin, denies any troubles with coumadin  2. Symptomatic bradycardia - pacemaker with normal function at last check in EP clinic 11/2012 - denies any lightheadedness or dizziness  3. HTN - does not check at home - compliant with meds  4. Hyperlipidemia - compliant with statin, followed by [name]   Past Medical History  Diagnosis Date  . Hypertension   . Hyperlipidemia   . Pancreatitis   . Gastroesophageal reflux disease   . Impaired glucose tolerance   . Hypothyroidism   . Macular degeneration   . Sleep apnea     Nocturnal oxygen therapy  . Arthritis   . Sick sinus syndrome 2011    Atrial fibrilation 10/2009; and dual chamber pacemaker in 4/11; normal EF  . Paroxysmal atrial fibrillation 2011  . Hearing impairment      No Known Allergies   Current Outpatient Prescriptions  Medication Sig Dispense Refill  . digoxin (LANOXIN) 0.125 MG tablet Take 1 tablet (0.125 mg total) by mouth daily.  30 tablet  6  . fluticasone (FLONASE) 50 MCG/ACT nasal spray Place 1 spray into the nose daily.      . hydrochlorothiazide (HYDRODIURIL) 25 MG tablet TAKE 1 TABLET BY MOUTH ONCE DAILY.  30 tablet  1  . levothyroxine (SYNTHROID, LEVOTHROID) 100 MCG tablet TAKE 1 TABLET BY MOUTH MONDAY THROUGH FRIDAY AND TAKE (1 AND 1/2) TABLET ON SATURDAY AND SUNDAY AS DIRECTED FOR THYROID.  34 tablet  1  . lovastatin (MEVACOR) 40 MG tablet Take 1 tablet (40 mg total) by mouth daily.  30 tablet  11  . metoprolol (LOPRESSOR) 100 MG tablet TAKE 1 TABLET BY MOUTH TWICE A DAY.  60 tablet  1  . multivitamin-lutein (OCUVITE-LUTEIN) CAPS Take 1 capsule by mouth daily.      Marland Kitchen omeprazole (PRILOSEC) 20  MG capsule TAKE (1) CAPSULE BY MOUTH ONCE DAILY FOR ACID REFLUX.  30 capsule  2  . OXYGEN-HELIUM IN Inhale 2 L into the lungs at bedtime.      . Potassium 99 MG TABS Take 99 mg by mouth every morning.       . warfarin (COUMADIN) 5 MG tablet TAKE 1 TABLET BY MOUTH DAILY OR AS DIRECTED.  45 tablet  1   No current facility-administered medications for this visit.     Past Surgical History  Procedure Laterality Date  . Abdominal hysterectomy    . Salpingoophorectomy  1970    right  . Cataract extraction      Bilateral  . Pacemaker insertion  2011    dual chamber   . Colonoscopy  2009    Hayley. Gala Romney  . Eye surgery      laser      No Known Allergies    Family History  Problem Relation Age of Onset  . Cancer Mother     gential  . Cancer Father     throat   . Pneumonia Sister   . Heart disease    . Arthritis    . Lung disease    . Asthma       Social History [name] reports that she has never smoked. She does not have any  smokeless tobacco history on file. [name] reports that she does not drink alcohol.   Review of Systems CONSTITUTIONAL: No weight loss, fever, chills, weakness or fatigue.  HEENT: Eyes: No visual loss, blurred vision, double vision or yellow sclerae.No hearing loss, sneezing, congestion, runny nose or sore throat.  SKIN: No rash or itching.  CARDIOVASCULAR: per HPI RESPIRATORY: per HPI GASTROINTESTINAL: No anorexia, nausea, vomiting or diarrhea. No abdominal pain or blood.  GENITOURINARY: No burning on urination, no polyuria NEUROLOGICAL: No headache, dizziness, syncope, paralysis, ataxia, numbness or tingling in the extremities. No change in bowel or bladder control.  MUSCULOSKELETAL: No muscle, back pain, joint pain or stiffness.  LYMPHATICS: No enlarged nodes. No history of splenectomy.  PSYCHIATRIC: No history of depression or anxiety.  ENDOCRINOLOGIC: No reports of sweating, cold or heat intolerance. No polyuria or polydipsia.   Marland Kitchen   Physical Examination p 70 bp 140/60 Wt 169 lbs BMI 27 Gen: resting comfortably, no acute distress HEENT: no scleral icterus, pupils equal round and reactive, no palptable cervical adenopathy,  CV: irreg, no m/r/g, no JVD, no carotid bruits Resp: Clear to auscultation bilaterally GI: abdomen is soft, non-tender, non-distended, normal bowel sounds, no hepatosplenomegaly MSK: extremities are warm, no edema.  Skin: warm, no rash Neuro:  no focal deficits Psych: appropriate affect    Pertinent labs 05/2013: Na 138 K 4.2 BUN 18 Cr 0.93 ALT 12 AST 16 TC 154 HDL 36 TG 266 LDL 65 TSH 0.741   Assessment and Plan  1. Afib - no significant symptoms, seems to be more aware of heart beat at night time when she is laying quietly on her left side, otherwise no symptoms. - continue current meds  2. HTN - at goal, continue current meds  3. HL - at goal, continue current statin  4. Symptomatic bradycardia - normal pacemaker check in 11/2012, continue regular pacemaker follow up. Denies any symptoms.       Arnoldo Lenis, M.D., F.A.C.C.

## 2013-07-04 NOTE — Patient Instructions (Addendum)
Your physician recommends that you schedule a follow-up appointment in: one year  

## 2013-07-08 LAB — MDC_IDC_ENUM_SESS_TYPE_INCLINIC
Date Time Interrogation Session: 20141107191553
Lead Channel Impedance Value: 362.5 Ohm
Lead Channel Pacing Threshold Amplitude: 1.125 V
Lead Channel Pacing Threshold Pulse Width: 0.5 ms
Lead Channel Pacing Threshold Pulse Width: 1.2 ms
Lead Channel Sensing Intrinsic Amplitude: 1.2 mV
Lead Channel Sensing Intrinsic Amplitude: 8.2 mV
Lead Channel Sensing Intrinsic Amplitude: 8.2 mV
Lead Channel Setting Pacing Amplitude: 1.375
Lead Channel Setting Pacing Amplitude: 3 V
Lead Channel Setting Pacing Pulse Width: 0.5 ms
Lead Channel Setting Sensing Sensitivity: 2 mV

## 2013-07-11 ENCOUNTER — Encounter: Payer: Self-pay | Admitting: Internal Medicine

## 2013-07-15 ENCOUNTER — Telehealth: Payer: Self-pay | Admitting: Family Medicine

## 2013-07-16 MED ORDER — OMEPRAZOLE 40 MG PO CPDR: 40.0000 mg | DELAYED_RELEASE_CAPSULE | Freq: Every day | ORAL | Status: DC

## 2013-07-16 NOTE — Telephone Encounter (Signed)
Pls send in higher dose of 40mg  one daily #30 refill 3 . Ensure eshe knows to reduce all caffeine intake also, notify pharmacy of change and remove the lower dose also pls

## 2013-07-16 NOTE — Telephone Encounter (Signed)
Can this be increased?

## 2013-07-16 NOTE — Telephone Encounter (Signed)
It can be increased short term (preferably ) to 40mg  daily. What are her symptoms? May need documentation for insurance to permit the inc also If increased heartburn, she should get an H pylori to see if she needs treatment .May even need GI eval Pls let me know

## 2013-07-16 NOTE — Telephone Encounter (Signed)
Med sent and old one deleted off med list. Note sent to pharmacy making them aware of change

## 2013-07-16 NOTE — Telephone Encounter (Signed)
Patient reports not really having heartburn but just doing alot of burping and acid comes up in her throat. States she has been on 20mg  for years and just doesn't think its strong enough.

## 2013-07-23 ENCOUNTER — Ambulatory Visit (INDEPENDENT_AMBULATORY_CARE_PROVIDER_SITE_OTHER): Payer: Medicare Other | Admitting: *Deleted

## 2013-07-23 DIAGNOSIS — I480 Paroxysmal atrial fibrillation: Secondary | ICD-10-CM

## 2013-07-23 DIAGNOSIS — Z7901 Long term (current) use of anticoagulants: Secondary | ICD-10-CM

## 2013-07-23 DIAGNOSIS — I4891 Unspecified atrial fibrillation: Secondary | ICD-10-CM

## 2013-07-23 LAB — POCT INR: INR: 1.7

## 2013-07-25 ENCOUNTER — Other Ambulatory Visit: Payer: Self-pay | Admitting: Family Medicine

## 2013-08-13 ENCOUNTER — Ambulatory Visit (INDEPENDENT_AMBULATORY_CARE_PROVIDER_SITE_OTHER): Payer: Medicare Other | Admitting: *Deleted

## 2013-08-13 DIAGNOSIS — I480 Paroxysmal atrial fibrillation: Secondary | ICD-10-CM

## 2013-08-13 DIAGNOSIS — I4891 Unspecified atrial fibrillation: Secondary | ICD-10-CM

## 2013-08-13 DIAGNOSIS — Z7901 Long term (current) use of anticoagulants: Secondary | ICD-10-CM

## 2013-08-13 LAB — POCT INR: INR: 2.3

## 2013-08-22 ENCOUNTER — Telehealth: Payer: Self-pay

## 2013-08-22 MED ORDER — DIGOXIN 125 MCG PO TABS: 0.1250 mg | ORAL_TABLET | Freq: Every day | ORAL | Status: DC

## 2013-08-22 NOTE — Telephone Encounter (Signed)
rx digoxin 125 mcg to [employer]

## 2013-08-26 ENCOUNTER — Telehealth: Payer: Self-pay | Admitting: Family Medicine

## 2013-08-26 DIAGNOSIS — Z79899 Other long term (current) drug therapy: Secondary | ICD-10-CM

## 2013-08-26 NOTE — Telephone Encounter (Signed)
Pls add magnesium level to upcoming labs dx PPI and digoxin use inc risk of low magnesium which could cause irreg heart rate, so drug monitoring should take  Msg about this is in from her ins company

## 2013-08-26 NOTE — Telephone Encounter (Signed)
Patients daughter aware and lab order mailed to her to do with upcoming labs

## 2013-08-26 NOTE — Telephone Encounter (Signed)
Spoke with Cecille Rubin regarding lab order and she is aware and it was mailed to her mother today

## 2013-08-26 NOTE — Addendum Note (Signed)
Addended by: Eual Fines on: 08/26/2013 08:27 AM   Modules accepted: Orders

## 2013-09-17 ENCOUNTER — Ambulatory Visit (INDEPENDENT_AMBULATORY_CARE_PROVIDER_SITE_OTHER): Payer: Medicare Other | Admitting: *Deleted

## 2013-09-17 DIAGNOSIS — I480 Paroxysmal atrial fibrillation: Secondary | ICD-10-CM

## 2013-09-17 DIAGNOSIS — I4891 Unspecified atrial fibrillation: Secondary | ICD-10-CM

## 2013-09-17 DIAGNOSIS — Z7901 Long term (current) use of anticoagulants: Secondary | ICD-10-CM

## 2013-09-17 LAB — POCT INR: INR: 1.9

## 2013-09-29 ENCOUNTER — Telehealth: Payer: Self-pay | Admitting: Family Medicine

## 2013-09-29 DIAGNOSIS — E785 Hyperlipidemia, unspecified: Secondary | ICD-10-CM

## 2013-09-29 DIAGNOSIS — E039 Hypothyroidism, unspecified: Secondary | ICD-10-CM

## 2013-09-29 DIAGNOSIS — Z79899 Other long term (current) drug therapy: Secondary | ICD-10-CM

## 2013-09-29 DIAGNOSIS — I10 Essential (primary) hypertension: Secondary | ICD-10-CM

## 2013-09-29 NOTE — Telephone Encounter (Signed)
Pls order and contact pt to have these labs done in march approx 1 week before her visit CBC, fasting lipid,cmp , TSH and Magnesium level needed (NOTE the magnesium is being suggested by the pharmacy due potential drg interactions, it was ordered idn Dec , biut not done so she needs this)

## 2013-09-30 NOTE — Addendum Note (Signed)
Addended by: Eual Fines on: 09/30/2013 12:55 PM   Modules accepted: Orders

## 2013-09-30 NOTE — Telephone Encounter (Signed)
Mailed to patient

## 2013-09-30 NOTE — Telephone Encounter (Signed)
Patient aware and will have done 1 week before visit, fasting

## 2013-10-22 ENCOUNTER — Other Ambulatory Visit: Payer: Self-pay | Admitting: Family Medicine

## 2013-10-22 ENCOUNTER — Ambulatory Visit (INDEPENDENT_AMBULATORY_CARE_PROVIDER_SITE_OTHER): Payer: Medicare Other | Admitting: *Deleted

## 2013-10-22 DIAGNOSIS — I480 Paroxysmal atrial fibrillation: Secondary | ICD-10-CM

## 2013-10-22 DIAGNOSIS — I4891 Unspecified atrial fibrillation: Secondary | ICD-10-CM

## 2013-10-22 DIAGNOSIS — Z5181 Encounter for therapeutic drug level monitoring: Secondary | ICD-10-CM | POA: Insufficient documentation

## 2013-10-22 DIAGNOSIS — Z7901 Long term (current) use of anticoagulants: Secondary | ICD-10-CM

## 2013-10-22 LAB — COMPREHENSIVE METABOLIC PANEL
ALBUMIN: 4.1 g/dL (ref 3.5–5.2)
ALK PHOS: 61 U/L (ref 39–117)
ALT: 12 U/L (ref 0–35)
AST: 16 U/L (ref 0–37)
BUN: 15 mg/dL (ref 6–23)
CO2: 30 mEq/L (ref 19–32)
Calcium: 9.3 mg/dL (ref 8.4–10.5)
Chloride: 100 mEq/L (ref 96–112)
Creat: 0.85 mg/dL (ref 0.50–1.10)
GLUCOSE: 101 mg/dL — AB (ref 70–99)
POTASSIUM: 3.7 meq/L (ref 3.5–5.3)
SODIUM: 138 meq/L (ref 135–145)
Total Bilirubin: 0.4 mg/dL (ref 0.2–1.2)
Total Protein: 6.6 g/dL (ref 6.0–8.3)

## 2013-10-22 LAB — CBC WITH DIFFERENTIAL/PLATELET
Basophils Absolute: 0.1 10*3/uL (ref 0.0–0.1)
Basophils Relative: 1 % (ref 0–1)
EOS ABS: 0.2 10*3/uL (ref 0.0–0.7)
EOS PCT: 2 % (ref 0–5)
HEMATOCRIT: 32.4 % — AB (ref 36.0–46.0)
Hemoglobin: 11.2 g/dL — ABNORMAL LOW (ref 12.0–15.0)
LYMPHS ABS: 2.7 10*3/uL (ref 0.7–4.0)
Lymphocytes Relative: 30 % (ref 12–46)
MCH: 29.3 pg (ref 26.0–34.0)
MCHC: 34.6 g/dL (ref 30.0–36.0)
MCV: 84.8 fL (ref 78.0–100.0)
MONO ABS: 0.7 10*3/uL (ref 0.1–1.0)
Monocytes Relative: 8 % (ref 3–12)
Neutro Abs: 5.3 10*3/uL (ref 1.7–7.7)
Neutrophils Relative %: 59 % (ref 43–77)
PLATELETS: 298 10*3/uL (ref 150–400)
RBC: 3.82 MIL/uL — AB (ref 3.87–5.11)
RDW: 13.9 % (ref 11.5–15.5)
WBC: 9 10*3/uL (ref 4.0–10.5)

## 2013-10-22 LAB — LIPID PANEL
Cholesterol: 158 mg/dL (ref 0–200)
HDL: 36 mg/dL — ABNORMAL LOW (ref >39)
LDL Cholesterol: 55 mg/dL (ref 0–99)
Total CHOL/HDL Ratio: 4.4 Ratio
Triglycerides: 334 mg/dL — ABNORMAL HIGH (ref <150)
VLDL: 67 mg/dL — ABNORMAL HIGH (ref 0–40)

## 2013-10-22 LAB — POCT INR: INR: 2.5

## 2013-10-22 LAB — MAGNESIUM: MAGNESIUM: 1.8 mg/dL (ref 1.5–2.5)

## 2013-10-22 LAB — TSH: TSH: 2.079 u[IU]/mL (ref 0.350–4.500)

## 2013-10-24 LAB — FERRITIN: Ferritin: 43 ng/mL (ref 10–291)

## 2013-10-24 LAB — IRON: Iron: 77 ug/dL (ref 42–145)

## 2013-10-24 LAB — VITAMIN B12: Vitamin B-12: 409 pg/mL (ref 211–911)

## 2013-10-31 ENCOUNTER — Telehealth: Payer: Self-pay | Admitting: *Deleted

## 2013-10-31 MED ORDER — WARFARIN SODIUM 5 MG PO TABS: 5.0000 mg | ORAL_TABLET | Freq: Once | ORAL | Status: DC

## 2013-10-31 MED ORDER — WARFARIN SODIUM 5 MG PO TABS: ORAL_TABLET | ORAL | Status: DC

## 2013-10-31 NOTE — Telephone Encounter (Signed)
Received fax refill request  Rx # [id number] Medication:  Warfarin Sodium 5 mg tab Qty 45 Sig:  Take one tablet by mouth daily or as directed  Physician:  Lovena Le

## 2013-11-05 ENCOUNTER — Other Ambulatory Visit (HOSPITAL_COMMUNITY)
Admission: RE | Admit: 2013-11-05 | Discharge: 2013-11-05 | Disposition: A | Discharge status: Home / Self Care | Payer: Medicare Other | Source: Ambulatory Visit | Attending: Family Medicine | Admitting: Family Medicine

## 2013-11-05 ENCOUNTER — Ambulatory Visit (INDEPENDENT_AMBULATORY_CARE_PROVIDER_SITE_OTHER): Payer: Medicare Other | Admitting: Family Medicine

## 2013-11-05 ENCOUNTER — Encounter (INDEPENDENT_AMBULATORY_CARE_PROVIDER_SITE_OTHER): Payer: Self-pay

## 2013-11-05 ENCOUNTER — Encounter: Payer: Self-pay | Admitting: Family Medicine

## 2013-11-05 VITALS — BP 148/70 | HR 64 | Resp 16 | Wt 167.0 lb

## 2013-11-05 DIAGNOSIS — R220 Localized swelling, mass and lump, head: Secondary | ICD-10-CM

## 2013-11-05 DIAGNOSIS — Z01419 Encounter for gynecological examination (general) (routine) without abnormal findings: Secondary | ICD-10-CM

## 2013-11-05 DIAGNOSIS — Z124 Encounter for screening for malignant neoplasm of cervix: Secondary | ICD-10-CM

## 2013-11-05 DIAGNOSIS — E785 Hyperlipidemia, unspecified: Secondary | ICD-10-CM

## 2013-11-05 DIAGNOSIS — E039 Hypothyroidism, unspecified: Secondary | ICD-10-CM

## 2013-11-05 DIAGNOSIS — R221 Localized swelling, mass and lump, neck: Secondary | ICD-10-CM

## 2013-11-05 DIAGNOSIS — Z0000 Encounter for general adult medical examination without abnormal findings: Secondary | ICD-10-CM

## 2013-11-05 DIAGNOSIS — Z1211 Encounter for screening for malignant neoplasm of colon: Secondary | ICD-10-CM

## 2013-11-05 DIAGNOSIS — I10 Essential (primary) hypertension: Secondary | ICD-10-CM

## 2013-11-05 DIAGNOSIS — R7301 Impaired fasting glucose: Secondary | ICD-10-CM

## 2013-11-05 LAB — POC HEMOCCULT BLD/STL (OFFICE/1-CARD/DIAGNOSTIC): Fecal Occult Blood, POC: NEGATIVE

## 2013-11-05 MED ORDER — OMEPRAZOLE 40 MG PO CPDR: 40.0000 mg | DELAYED_RELEASE_CAPSULE | Freq: Every day | ORAL | Status: DC

## 2013-11-05 MED ORDER — LEVOTHYROXINE SODIUM 100 MCG PO TABS: ORAL_TABLET | ORAL | Status: DC

## 2013-11-05 MED ORDER — HYDROCHLOROTHIAZIDE 25 MG PO TABS: ORAL_TABLET | ORAL | Status: DC

## 2013-11-05 MED ORDER — METOPROLOL TARTRATE 100 MG PO TABS: ORAL_TABLET | ORAL | Status: DC

## 2013-11-05 NOTE — Patient Instructions (Addendum)
F/u in 4 month, call if you need me before please  Triglycerides are high, otherwise labs are great, no change in medication, also your magnesium level is normal   Exam today is good.  You will be referred for an ultrasound of your neck to evaluate the swelling on the left side, and your thyroid  Fasting lipid , cmp and TSH in 4 month, before visit  Fall Prevention and Home Safety Falls cause injuries and can affect all age groups. It is possible to prevent falls.  HOW TO PREVENT FALLS  Wear shoes with rubber soles that do not have an opening for your toes.  Keep the inside and outside of your house well lit.  Use night lights throughout your home.  Remove clutter from floors.  Clean up floor spills.  Remove throw rugs or fasten them to the floor with carpet tape.  Do not place electrical cords across pathways.  Put grab bars by your tub, shower, and toilet. Do not use towel bars as grab bars.  Put handrails on both sides of the stairway. Fix loose handrails.  Do not climb on stools or stepladders, if possible.  Do not wax your floors.  Repair uneven or unsafe sidewalks, walkways, or stairs.  Keep items you use a lot within reach.  Be aware of pets.  Keep emergency numbers next to the telephone.  Put smoke detectors in your home and near bedrooms. Ask your doctor what other things you can do to prevent falls. Document Released: 06/10/2009 Document Revised: 02/13/2012 Document Reviewed: 11/14/2011 [facility] Patient Information 2014 [place], [place].

## 2013-11-06 ENCOUNTER — Telehealth: Payer: Self-pay | Admitting: Family Medicine

## 2013-11-06 DIAGNOSIS — E039 Hypothyroidism, unspecified: Secondary | ICD-10-CM

## 2013-11-06 DIAGNOSIS — R221 Localized swelling, mass and lump, neck: Secondary | ICD-10-CM

## 2013-11-06 NOTE — Telephone Encounter (Signed)
Noted, this is true , will be corrected and [place] ordered Pls see referral , thanks

## 2013-11-11 ENCOUNTER — Ambulatory Visit (HOSPITAL_COMMUNITY)
Admission: RE | Admit: 2013-11-11 | Discharge: 2013-11-11 | Disposition: A | Discharge status: Home / Self Care | Payer: Medicare Other | Source: Ambulatory Visit | Attending: Family Medicine | Admitting: Family Medicine

## 2013-11-11 DIAGNOSIS — E039 Hypothyroidism, unspecified: Secondary | ICD-10-CM

## 2013-11-11 DIAGNOSIS — R221 Localized swelling, mass and lump, neck: Secondary | ICD-10-CM

## 2013-11-11 DIAGNOSIS — E079 Disorder of thyroid, unspecified: Secondary | ICD-10-CM | POA: Insufficient documentation

## 2013-11-19 ENCOUNTER — Ambulatory Visit (INDEPENDENT_AMBULATORY_CARE_PROVIDER_SITE_OTHER): Payer: Medicare Other | Admitting: *Deleted

## 2013-11-19 DIAGNOSIS — I4891 Unspecified atrial fibrillation: Secondary | ICD-10-CM

## 2013-11-19 DIAGNOSIS — Z7901 Long term (current) use of anticoagulants: Secondary | ICD-10-CM

## 2013-11-19 DIAGNOSIS — I480 Paroxysmal atrial fibrillation: Secondary | ICD-10-CM

## 2013-11-19 DIAGNOSIS — Z5181 Encounter for therapeutic drug level monitoring: Secondary | ICD-10-CM

## 2013-11-19 LAB — POCT INR: INR: 3.2

## 2013-12-13 DIAGNOSIS — R221 Localized swelling, mass and lump, neck: Secondary | ICD-10-CM | POA: Insufficient documentation

## 2013-12-13 NOTE — Assessment & Plan Note (Addendum)
Patient and accompanying family member, her grand daughter are concerned about a left neck mass, not really appreciated by me on exam, however pt is referred for [place] of neck to further evaluate area of concern, also her thyroid gland will be imaged at that time

## 2013-12-13 NOTE — Assessment & Plan Note (Signed)
Controlled, no change in medication Updated lab needed at next visit.

## 2013-12-13 NOTE — Assessment & Plan Note (Signed)
Annual exam as documented. Counseling done  re healthy lifestyle involving commitment to regular  exercise each week, heart healthy diet, and attaining healthy weight.The importance of adequate sleep also discussed. Regular seat belt use  is also discussed.  Immunization and cancer screening needs are specifically addressed at this visit. Pap smear done and cells sent for cytology Breast exam is normal and mammogram up to date.

## 2013-12-13 NOTE — Assessment & Plan Note (Signed)
Patient re educated about the importance of limiting  Carbohydrate intake , the need to commit to daily physical activity  As able and weight loss. The fact that changes in all these areas will reduce or eliminate all together the development of diabetes is stressed.   Updated lab needed at/ before next visit.

## 2013-12-13 NOTE — Assessment & Plan Note (Signed)
Systolic pressure above desired goal, however, based on patient's , will not increase medication doses. She is encouraged to limit salt intake , which she does state she uses in excess, increased physical activity is also encouraged

## 2013-12-13 NOTE — Assessment & Plan Note (Signed)
Deterioration in triglycerides, dietary changes discussed, noi changes in medication.Pt does admit to excessive cheese and butter intake and states she will change this Hyperlipidemia:Low fat diet discussed and encouraged.  Updated lab needed at/ before next visit.

## 2013-12-13 NOTE — Progress Notes (Signed)
Subjective:    Patient ID: [name], female    DOB: 12-11-1925, 80 y.o.   MRN: [id number]  HPI Patient is in for annual exam Health maintainance is reviewed and updated, specifically screening tests and recommended immunizations. Recent lab and radiologic data, since previous visit is also reviewed with the patient. Healthy lifestyle as far as commitment to regular physical activity, heart healthy diet , safe habits, as far as seat belt  use, is discussed. The importance of adequate rest is also discussed. C/o swelling on left side of neck which she is concerned about.       Review of Systems See HPI Denies recent fever or chills. Denies sinus pressure, nasal congestion, ear pain or sore throat. Denies chest congestion, productive cough or wheezing.Does have a chronic cough, non productive Denies chest pains, palpitations and leg swelling Denies abdominal pain, nausea, vomiting,diarrhea or constipation.   Denies dysuria, frequency, hesitancy or incontinence. Chronic  joint pain, swelling and limitation in mobility. Denies headaches, seizures, numbness, or tingling. Denies depression, anxiety or insomnia. Denies skin break down or rash.        Objective:   Physical Exam  Pleasant well nourished female, alert and oriented x 3, in no cardio-pulmonary distress. Afebrile. HEENT No facial trauma or asymetry. Sinuses non tender.  EOMI, PERTL, External ears normal, tympanic membranes clear. Oropharynx moist, no exudate, poor  dentition. Neck: adequate though reduced ROM,, no adenopathy,JVD or thyromegaly.No bruits.possible  mass in area of concern on left side of neck, anterior aspect  Chest: Clear to ascultation bilaterally.No crackles or wheezes. Non tender to palpation  Breast: No asymetry,no masses. No nipple discharge or inversion. No axillary or supraclavicular adenopathy  Cardiovascular system; Heart sounds normal,  S1 and  S2 ,no S3.  No murmur, or  thrill. Apical beat not displaced Peripheral pulses normal.  Abdomen: Soft, non tender, no organomegaly or masses. No bruits. Bowel sounds normal. No guarding, tenderness or rebound.  Rectal:  No mass. Guaiac negative stool.  GU: External genitalia normal. No lesions. Vaginal canal normal.No discharge. Uterus absent, cervix present , no adnexal masses, no  adnexal tenderness.  Musculoskeletal exam: Decreased  ROM of spine, hips , shoulders and knees. deformity ,and swelling of knees noted  No muscle wasting or atrophy.   Neurologic: Cranial nerves 2 to 12 intact.Pt does have reduced hearing and vision. Does not want hearing aids though would benefit, has her eyes examined regularly Power, tone ,sensation and reflexes normal throughout.  No tremor.  Skin: Intact, no ulceration, erythema , scaling or rash noted. Pigmentation normal throughout  Psych; Normal mood and affect. Judgement and concentration normal       Assessment & Plan:  Routine general medical examination at a health care facility Annual exam as documented. Counseling done  re healthy lifestyle involving commitment to regular  exercise each week, heart healthy diet, and attaining healthy weight.The importance of adequate sleep also discussed. Regular seat belt use  is also discussed.  Immunization and cancer screening needs are specifically addressed at this visit. Pap smear done and cells sent for cytology Breast exam is normal and mammogram up to date.   HYPOTHYROIDISM Controlled, no change in medication Updated lab needed at next visit.   HYPERLIPIDEMIA Deterioration in triglycerides, dietary changes discussed, noi changes in medication.Pt does admit to excessive cheese and butter intake and states she will change this Hyperlipidemia:Low fat diet discussed and encouraged.  Updated lab needed at/ before next visit.   Mass of  left side of neck Patient and accompanying family member, her grand  daughter are concerned about a left neck mass, not really appreciated by me on exam, however pt is referred for [place] of neck to further evaluate area of concern, also her thyroid gland will be imaged at that time  HYPERTENSION Systolic pressure above desired goal, however, based on patient's , will not increase medication doses. She is encouraged to limit salt intake , which she does state she uses in excess, increased physical activity is also encouraged  Impaired fasting glucose Patient re educated about the importance of limiting  Carbohydrate intake , the need to commit to daily physical activity  As able and weight loss. The fact that changes in all these areas will reduce or eliminate all together the development of diabetes is stressed.   Updated lab needed at/ before next visit.

## 2013-12-18 ENCOUNTER — Ambulatory Visit (INDEPENDENT_AMBULATORY_CARE_PROVIDER_SITE_OTHER): Payer: Medicare Other | Admitting: Internal Medicine

## 2013-12-18 ENCOUNTER — Ambulatory Visit (INDEPENDENT_AMBULATORY_CARE_PROVIDER_SITE_OTHER): Payer: Medicare Other | Admitting: *Deleted

## 2013-12-18 ENCOUNTER — Encounter: Payer: Self-pay | Admitting: Internal Medicine

## 2013-12-18 VITALS — BP 131/47 | HR 60 | Ht 66.0 in | Wt 164.0 lb

## 2013-12-18 DIAGNOSIS — Z950 Presence of cardiac pacemaker: Secondary | ICD-10-CM

## 2013-12-18 DIAGNOSIS — Z7901 Long term (current) use of anticoagulants: Secondary | ICD-10-CM

## 2013-12-18 DIAGNOSIS — Z5181 Encounter for therapeutic drug level monitoring: Secondary | ICD-10-CM

## 2013-12-18 DIAGNOSIS — I4891 Unspecified atrial fibrillation: Secondary | ICD-10-CM

## 2013-12-18 DIAGNOSIS — I480 Paroxysmal atrial fibrillation: Secondary | ICD-10-CM

## 2013-12-18 DIAGNOSIS — I10 Essential (primary) hypertension: Secondary | ICD-10-CM

## 2013-12-18 LAB — MDC_IDC_ENUM_SESS_TYPE_INCLINIC
Battery Voltage: 2.89 V
Brady Statistic RA Percent Paced: 93 %
Date Time Interrogation Session: 20150423100153
Implantable Pulse Generator Serial Number: 7117341
Lead Channel Impedance Value: 337.5 Ohm
Lead Channel Impedance Value: 387.5 Ohm
Lead Channel Pacing Threshold Amplitude: 1.5 V
Lead Channel Pacing Threshold Pulse Width: 1.2 ms
Lead Channel Sensing Intrinsic Amplitude: 1.1 mV
Lead Channel Setting Pacing Amplitude: 2.5 V
MDC IDC MSMT BATTERY REMAINING LONGEVITY: 25.2 mo
MDC IDC MSMT LEADCHNL RV PACING THRESHOLD AMPLITUDE: 1 V
MDC IDC MSMT LEADCHNL RV PACING THRESHOLD PULSEWIDTH: 0.5 ms
MDC IDC MSMT LEADCHNL RV SENSING INTR AMPL: 9 mV
MDC IDC SET LEADCHNL RA PACING AMPLITUDE: 3 V
MDC IDC SET LEADCHNL RV PACING PULSEWIDTH: 0.5 ms
MDC IDC SET LEADCHNL RV SENSING SENSITIVITY: 2 mV
MDC IDC STAT BRADY RV PERCENT PACED: 0.75 %

## 2013-12-18 LAB — POCT INR: INR: 2.2

## 2013-12-18 NOTE — Patient Instructions (Signed)
Your physician wants you to follow-up in: 6 months for device clinic and 1 year Dr.Taylor You will receive a reminder letter in the mail two months in advance. If you don't receive a letter, please call our office to schedule the follow-up appointment.     Your physician recommends that you continue on your current medications as directed. Please refer to the Current Medication list given to you today.

## 2013-12-18 NOTE — Assessment & Plan Note (Signed)
Her blood pressure is well controlled today. Continue current meds.

## 2013-12-18 NOTE — Progress Notes (Signed)
HPI [name] returns today for followup. She is a very pleasant 80 year old woman with a history of symptomatic bradycardia, status post permanent pacemaker insertion, hypertension, and paroxysmal atrial fibrillation. In the interim, she has been stable. She denies chest pain or shortness of breath. No syncope. No peripheral edema. She has a history of falls but has had none recently. Her atrial lead has an elevated threshold. No Known Allergies   Current Outpatient Prescriptions  Medication Sig Dispense Refill  . digoxin (LANOXIN) 0.125 MG tablet Take 1 tablet (0.125 mg total) by mouth daily.  30 tablet  6  . fluticasone (FLONASE) 50 MCG/ACT nasal spray Place 1 spray into the nose daily.      . hydrochlorothiazide (HYDRODIURIL) 25 MG tablet TAKE 1 TABLET BY MOUTH ONCE DAILY.  30 tablet  4  . levothyroxine (SYNTHROID, LEVOTHROID) 100 MCG tablet TAKE 1 TABLET BY MOUTH MONDAY THROUGH FRIDAY AND TAKE 1 AND 1/2 TABLETS ON SATURDAY AND SUNDAY AS DIRECTED FOR THYROID.  34 tablet  4  . lovastatin (MEVACOR) 40 MG tablet Take 1 tablet (40 mg total) by mouth daily.  30 tablet  11  . metoprolol (LOPRESSOR) 100 MG tablet TAKE 1 TABLET BY MOUTH TWICE A DAY.  60 tablet  4  . multivitamin-lutein (OCUVITE-LUTEIN) CAPS Take 1 capsule by mouth daily.      Marland Kitchen omeprazole (PRILOSEC) 40 MG capsule Take 1 capsule (40 mg total) by mouth daily.  30 capsule  4  . OXYGEN-HELIUM IN Inhale 2 L into the lungs at bedtime.      . Potassium 99 MG TABS Take 99 mg by mouth every morning.       . warfarin (COUMADIN) 5 MG tablet Take 1/2 tablet daily except 1 tablet on Sundays and Wednesdays or as directed  45 tablet  3   No current facility-administered medications for this visit.     Past Medical History  Diagnosis Date  . Hypertension   . Hyperlipidemia   . Pancreatitis   . Gastroesophageal reflux disease   . Impaired glucose tolerance   . Hypothyroidism   . Macular degeneration   . Sleep apnea     Nocturnal oxygen  therapy  . Arthritis   . Sick sinus syndrome 2011    Atrial fibrilation 10/2009; and dual chamber pacemaker in 4/11; normal EF  . Paroxysmal atrial fibrillation 2011  . Hearing impairment     ROS:   All systems reviewed and negative except as noted in the HPI.   Past Surgical History  Procedure Laterality Date  . Abdominal hysterectomy    . Salpingoophorectomy  1970    right  . Cataract extraction      Bilateral  . Pacemaker insertion  2011    dual chamber   . Colonoscopy  2009    Dr. Gala Romney  . Eye surgery      laser      Family History  Problem Relation Age of Onset  . Cancer Mother     gential  . Cancer Father     throat   . Pneumonia Sister   . Heart disease    . Arthritis    . Lung disease    . Asthma       History   Social History  . Marital Status: Widowed    Spouse Name: N/A    Number of Children: 7  . Years of Education: college   Occupational History  . retired     Social History Main  Topics  . Smoking status: Never Smoker   . Smokeless tobacco: Not on file  . Alcohol Use: No  . Drug Use: Not on file  . Sexual Activity: Not on file   Other Topics Concern  . Not on file   Social History Narrative  . No narrative on file     BP 131/47  Pulse 60  Ht 5\' 6"  (QA348G m)  Wt 164 lb (74.39 kg)  BMI 26.48 kg/m2  Physical Exam:  Well appearing 80 year old woman, NAD HEENT: Unremarkable Neck:  No JVD, no thyromegally Back:  No CVA tenderness Lungs:  Clear with no wheezes, rales, or rhonchi. HEART:  Regular rate rhythm, no murmurs, no rubs, no clicks Abd:  soft, positive bowel sounds, no organomegally, no rebound, no guarding Ext:  2 plus pulses, no edema, no cyanosis, no clubbing Skin:  No rashes no nodules Neuro:  CN II through XII intact, motor grossly intact  DEVICE  Normal device function.  See PaceArt for details.   Assess/Plan:

## 2013-12-18 NOTE — Assessment & Plan Note (Signed)
Her St. Jude PPM is working normally but her battery longevity is reduced as she has an elevated atrial pacing threshold. Will follow. I am reluctant to change her chronic pacing parameters.

## 2013-12-18 NOTE — Assessment & Plan Note (Signed)
She is maintaining NSR 99% of the time. No change in meds. As she has not fallen, will continue coumadin.

## 2013-12-23 ENCOUNTER — Other Ambulatory Visit: Payer: Self-pay | Admitting: Family Medicine

## 2013-12-24 ENCOUNTER — Ambulatory Visit (INDEPENDENT_AMBULATORY_CARE_PROVIDER_SITE_OTHER): Payer: Medicare Other | Admitting: Family Medicine

## 2013-12-24 ENCOUNTER — Encounter (INDEPENDENT_AMBULATORY_CARE_PROVIDER_SITE_OTHER): Payer: Self-pay

## 2013-12-24 ENCOUNTER — Encounter: Payer: Self-pay | Admitting: Family Medicine

## 2013-12-24 VITALS — BP 140/70 | HR 62 | Temp 98.1°F | Resp 16 | Ht 65.0 in | Wt 163.8 lb

## 2013-12-24 DIAGNOSIS — J329 Chronic sinusitis, unspecified: Secondary | ICD-10-CM

## 2013-12-24 DIAGNOSIS — H6690 Otitis media, unspecified, unspecified ear: Secondary | ICD-10-CM

## 2013-12-24 DIAGNOSIS — I10 Essential (primary) hypertension: Secondary | ICD-10-CM

## 2013-12-24 DIAGNOSIS — E039 Hypothyroidism, unspecified: Secondary | ICD-10-CM

## 2013-12-24 DIAGNOSIS — H6692 Otitis media, unspecified, left ear: Secondary | ICD-10-CM

## 2013-12-24 MED ORDER — PENICILLIN V POTASSIUM 500 MG PO TABS: 500.0000 mg | ORAL_TABLET | Freq: Three times a day (TID) | ORAL | Status: DC

## 2013-12-24 MED ORDER — CEFTRIAXONE SODIUM 1 G IJ SOLR
500.0000 mg | Freq: Once | INTRAMUSCULAR | Status: AC
  Administered 2013-12-24: 500 mg via INTRAMUSCULAR

## 2013-12-24 NOTE — Patient Instructions (Signed)
F/u as before.  You are treated for left ear infection and sinusitis.  Rocephin in office today followed by a 10 day course of penicillin  If your sympoms get worse or if you are not totally better in the next 7 to 10 days please call.  Please take the ENTIRE course of penicillin prescribed  Lungs are clear

## 2013-12-28 NOTE — Assessment & Plan Note (Signed)
Rocephin in office followed by penicillin and decongestant, call back if no better or worsens

## 2013-12-28 NOTE — Progress Notes (Signed)
   Subjective:    Patient ID: [name], female    DOB: 12/27/1925, 80 y.o.   MRN: [id number]  HPI 1 week h/o worsening sinus pressure with thick yellow drainage and left ear pain , pressure and reduced hearing. She has had intermittent fever and chills. Was seen in urgent care this past Friday, started on omnicef, but states it has been of no benefit as she feels worse. Denies sore throat or sputum production or productive cough   Review of Systems See HPI . Denies chest pains, palpitations and leg swelling Denies abdominal pain, nausea, vomiting,diarrhea or constipation.   Denies dysuria, frequency, hesitancy or incontinence. Denies joint pain, swelling and limitation in mobility. Denies headaches, seizures, numbness, or tingling. Denies depression, anxiety or insomnia. Denies skin break down or rash.        Objective:   Physical Exam  BP 140/70  Pulse 62  Temp(Src) 98.1 F (36.7 C) (Oral)  Resp 16  Ht 5\' 5"  (1.651 m)  Wt 163 lb 12.8 oz (74.299 kg)  BMI 27.26 kg/m2  SpO2 97% Patient alert and oriented and in no cardiopulmonary distress.Ill appearing  HEENT: No facial asymmetry, EOMI, frontal and maxillary  sinus tenderness,  oropharynx pink and moist.  Neck supple no adenopathy.Left TM erythematous with fluid behind eardrum and reduced light reflex  Chest: Clear to auscultation bilaterally.  CVS: S1, S2 no murmurs, no S3.  ABD: Soft non tender. Bowel sounds normal.  Ext: No edema  MS: Adequate ROM spine, shoulders, hips and knees.  Skin: Intact, no ulcerations or rash noted.  Psych: Good eye contact, normal affect. Memory intact not anxious or depressed appearing.  CNS: CN 2-12 intact, power, tone and sensation normal throughout.       Assessment & Plan:  Sinusitis Rocephin in office followed by penicillin and decongestant, call back if no better or worsens  Left otitis media 10 day course of penicillin prescribed  HYPOTHYROIDISM Controlled, no  change in medication   HYPERTENSION Controlled, no change in medication

## 2013-12-28 NOTE — Assessment & Plan Note (Signed)
10 day course of penicillin prescribed 

## 2013-12-28 NOTE — Assessment & Plan Note (Signed)
Controlled, no change in medication  

## 2014-01-15 ENCOUNTER — Ambulatory Visit (INDEPENDENT_AMBULATORY_CARE_PROVIDER_SITE_OTHER): Payer: Medicare Other | Admitting: *Deleted

## 2014-01-15 DIAGNOSIS — I480 Paroxysmal atrial fibrillation: Secondary | ICD-10-CM

## 2014-01-15 DIAGNOSIS — Z5181 Encounter for therapeutic drug level monitoring: Secondary | ICD-10-CM

## 2014-01-15 DIAGNOSIS — Z7901 Long term (current) use of anticoagulants: Secondary | ICD-10-CM

## 2014-01-15 DIAGNOSIS — I4891 Unspecified atrial fibrillation: Secondary | ICD-10-CM

## 2014-01-15 LAB — POCT INR: INR: 3.5

## 2014-02-04 ENCOUNTER — Ambulatory Visit (INDEPENDENT_AMBULATORY_CARE_PROVIDER_SITE_OTHER): Payer: Medicare Other | Admitting: *Deleted

## 2014-02-04 DIAGNOSIS — Z5181 Encounter for therapeutic drug level monitoring: Secondary | ICD-10-CM

## 2014-02-04 DIAGNOSIS — I480 Paroxysmal atrial fibrillation: Secondary | ICD-10-CM

## 2014-02-04 DIAGNOSIS — I4891 Unspecified atrial fibrillation: Secondary | ICD-10-CM

## 2014-02-04 DIAGNOSIS — Z7901 Long term (current) use of anticoagulants: Secondary | ICD-10-CM

## 2014-02-04 LAB — POCT INR: INR: 2.8

## 2014-03-04 ENCOUNTER — Ambulatory Visit (INDEPENDENT_AMBULATORY_CARE_PROVIDER_SITE_OTHER): Payer: Medicare Other | Admitting: *Deleted

## 2014-03-04 DIAGNOSIS — Z7901 Long term (current) use of anticoagulants: Secondary | ICD-10-CM

## 2014-03-04 DIAGNOSIS — I480 Paroxysmal atrial fibrillation: Secondary | ICD-10-CM

## 2014-03-04 DIAGNOSIS — I4891 Unspecified atrial fibrillation: Secondary | ICD-10-CM

## 2014-03-04 DIAGNOSIS — Z5181 Encounter for therapeutic drug level monitoring: Secondary | ICD-10-CM

## 2014-03-04 LAB — POCT INR: INR: 2.6

## 2014-03-16 LAB — COMPREHENSIVE METABOLIC PANEL
ALT: 14 U/L (ref 0–35)
AST: 17 U/L (ref 0–37)
Albumin: 3.7 g/dL (ref 3.5–5.2)
Alkaline Phosphatase: 67 U/L (ref 39–117)
BILIRUBIN TOTAL: 0.5 mg/dL (ref 0.2–1.2)
BUN: 16 mg/dL (ref 6–23)
CO2: 29 meq/L (ref 19–32)
CREATININE: 0.98 mg/dL (ref 0.50–1.10)
Calcium: 9.4 mg/dL (ref 8.4–10.5)
Chloride: 99 mEq/L (ref 96–112)
Glucose, Bld: 102 mg/dL — ABNORMAL HIGH (ref 70–99)
Potassium: 3.8 mEq/L (ref 3.5–5.3)
Sodium: 137 mEq/L (ref 135–145)
Total Protein: 6.7 g/dL (ref 6.0–8.3)

## 2014-03-16 LAB — LIPID PANEL
Cholesterol: 145 mg/dL (ref 0–200)
HDL: 37 mg/dL — ABNORMAL LOW (ref >39)
LDL Cholesterol: 52 mg/dL (ref 0–99)
TRIGLYCERIDES: 280 mg/dL — AB (ref <150)
Total CHOL/HDL Ratio: 3.9 Ratio
VLDL: 56 mg/dL — AB (ref 0–40)

## 2014-03-16 LAB — TSH: TSH: 1.038 u[IU]/mL (ref 0.350–4.500)

## 2014-03-18 ENCOUNTER — Encounter: Payer: Self-pay | Admitting: Family Medicine

## 2014-03-18 ENCOUNTER — Ambulatory Visit (INDEPENDENT_AMBULATORY_CARE_PROVIDER_SITE_OTHER): Payer: Medicare Other | Admitting: Family Medicine

## 2014-03-18 VITALS — BP 140/70 | HR 73 | Resp 16 | Wt 163.8 lb

## 2014-03-18 DIAGNOSIS — E663 Overweight: Secondary | ICD-10-CM

## 2014-03-18 DIAGNOSIS — E785 Hyperlipidemia, unspecified: Secondary | ICD-10-CM

## 2014-03-18 DIAGNOSIS — J302 Other seasonal allergic rhinitis: Secondary | ICD-10-CM

## 2014-03-18 DIAGNOSIS — R7301 Impaired fasting glucose: Secondary | ICD-10-CM

## 2014-03-18 DIAGNOSIS — I4891 Unspecified atrial fibrillation: Secondary | ICD-10-CM

## 2014-03-18 DIAGNOSIS — J309 Allergic rhinitis, unspecified: Secondary | ICD-10-CM

## 2014-03-18 DIAGNOSIS — Z23 Encounter for immunization: Secondary | ICD-10-CM

## 2014-03-18 DIAGNOSIS — I10 Essential (primary) hypertension: Secondary | ICD-10-CM

## 2014-03-18 DIAGNOSIS — K219 Gastro-esophageal reflux disease without esophagitis: Secondary | ICD-10-CM

## 2014-03-18 DIAGNOSIS — E039 Hypothyroidism, unspecified: Secondary | ICD-10-CM

## 2014-03-18 DIAGNOSIS — I480 Paroxysmal atrial fibrillation: Secondary | ICD-10-CM

## 2014-03-18 NOTE — Patient Instructions (Addendum)
F/u m,id December,  call if you need me before  Pneumonia vaccine today  Call for flu vaccine in Sept, check pharmacy for shingles vaccine  Blood work has improved, congrats.keep it up  Fasting lipid, cmp and TSH mid Dec

## 2014-03-18 NOTE — Progress Notes (Signed)
   Subjective:    Patient ID: [name], female    DOB: 06-Sep-1925, 80 y.o.   MRN: [id number]  HPI The PT is here for follow up and re-evaluation of chronic medical conditions, medication management and review of any available recent lab and radiology data.  Preventive health is updated, specifically  Cancer screening and Immunization.   Questions or concerns regarding consultations or procedures which the PT has had in the interim are  addressed. The PT denies any adverse reactions to current medications since the last visit.  There are no new concerns.  There are no specific complaints       Review of Systems See HPI Denies recent fever or chills. Denies sinus pressure, nasal congestion, ear pain or sore throat. Denies chest congestion, productive cough or wheezing. Denies chest pains, palpitations and leg swelling Denies abdominal pain, nausea, vomiting,diarrhea or constipation.   Denies dysuria, frequency, hesitancy or incontinence. Denies uncontrolled  joint pain, swelling and limitation in mobility. Denies headaches, seizures, numbness, or tingling. Denies depression, anxiety or insomnia. Denies skin break down or rash.        Objective:   Physical Exam  BP 140/70  Pulse 73  Resp 16  Wt 163 lb 12.8 oz (74.299 kg)  SpO2 99% Patient alert and oriented and in no cardiopulmonary distress.  HEENT: No facial asymmetry, EOMI,   oropharynx pink and moist.  Neck supple no JVD, no mass.  Chest: Clear to auscultation bilaterally.Decreased though adequate air entry bilaterally  CVS: S1, S2 no murmurs, no S3.Regular rate.  ABD: Soft non tender.   Ext: No edema  MS: Adequate ROM spine, shoulders, hips and knees.  Skin: Intact, no ulcerations or rash noted.  Psych: Good eye contact, normal affect. Memory intact not anxious or depressed appearing.  CNS: CN 2-12 intact, power,  normal throughout.no focal deficits noted.       Assessment & Plan:   HYPERTENSION Controlled, no change in medication   Paroxysmal atrial fibrillation Rate controlled with pacemaker , rate still irregularly irregular  HYPOTHYROIDISM Controlled, no change in medication Updated lab needed at/ before next visit.   Impaired fasting glucose Patient advised to reduce carb and sweets, commit to regular physical activity, take meds as prescribed, test blood as directed, and attempt to lose weight, to improve blood sugar control. Updated lab needed at/ before next visit.   HYPERLIPIDEMIA Improved Hyperlipidemia:Low fat diet discussed and encouraged.  Updated lab needed at/ before next visit.    Overweight (BMI 25.0-29.9) Improved. Pt applauded on succesful weight loss through lifestyle change, and encouraged to continue same. Weight loss goal set for the next several months.   Need for vaccination with 13-polyvalent pneumococcal conjugate vaccine Vaccine administered at visit.   Seasonal allergies Controlled, no change in medication   GERD (gastroesophageal reflux disease) Controlled, no change in medication

## 2014-03-23 ENCOUNTER — Telehealth: Payer: Self-pay | Admitting: Adult Health

## 2014-03-23 MED ORDER — DIGOXIN 125 MCG PO TABS: 0.1250 mg | ORAL_TABLET | Freq: Every day | ORAL | Status: DC

## 2014-03-23 NOTE — Telephone Encounter (Signed)
Received fax refill request  Rx # [id number] Medication:  Digoxin 125 mcg tablets Qty 30 Sig:  Take one tablet by mouth once a day Physician:  Purcell Nails

## 2014-04-01 ENCOUNTER — Ambulatory Visit (INDEPENDENT_AMBULATORY_CARE_PROVIDER_SITE_OTHER): Payer: Medicare Other | Admitting: *Deleted

## 2014-04-01 DIAGNOSIS — Z5181 Encounter for therapeutic drug level monitoring: Secondary | ICD-10-CM

## 2014-04-01 DIAGNOSIS — I480 Paroxysmal atrial fibrillation: Secondary | ICD-10-CM

## 2014-04-01 DIAGNOSIS — I4891 Unspecified atrial fibrillation: Secondary | ICD-10-CM

## 2014-04-01 DIAGNOSIS — Z7901 Long term (current) use of anticoagulants: Secondary | ICD-10-CM

## 2014-04-01 LAB — POCT INR: INR: 2.1

## 2014-04-23 ENCOUNTER — Other Ambulatory Visit: Payer: Self-pay | Admitting: Family Medicine

## 2014-05-05 DIAGNOSIS — Z23 Encounter for immunization: Secondary | ICD-10-CM | POA: Insufficient documentation

## 2014-05-05 DIAGNOSIS — J302 Other seasonal allergic rhinitis: Secondary | ICD-10-CM | POA: Insufficient documentation

## 2014-05-05 DIAGNOSIS — K219 Gastro-esophageal reflux disease without esophagitis: Secondary | ICD-10-CM | POA: Insufficient documentation

## 2014-05-05 NOTE — Assessment & Plan Note (Signed)
Patient advised to reduce carb and sweets, commit to regular physical activity, take meds as prescribed, test blood as directed, and attempt to lose weight, to improve blood sugar control. Updated lab needed at/ before next visit.  

## 2014-05-05 NOTE — Assessment & Plan Note (Signed)
Controlled, no change in medication  

## 2014-05-05 NOTE — Assessment & Plan Note (Signed)
Rate controlled with pacemaker , rate still irregularly irregular

## 2014-05-05 NOTE — Assessment & Plan Note (Signed)
Controlled, no change in medication Updated lab needed at/ before next visit.  

## 2014-05-05 NOTE — Assessment & Plan Note (Signed)
Improved. Pt applauded on succesful weight loss through lifestyle change, and encouraged to continue same. Weight loss goal set for the next several months.  

## 2014-05-05 NOTE — Assessment & Plan Note (Signed)
Improved Hyperlipidemia:Low fat diet discussed and encouraged.  Updated lab needed at/ before next visit.

## 2014-05-05 NOTE — Assessment & Plan Note (Signed)
Vaccine administered at visit.  

## 2014-05-13 ENCOUNTER — Ambulatory Visit (INDEPENDENT_AMBULATORY_CARE_PROVIDER_SITE_OTHER): Payer: Medicare Other | Admitting: *Deleted

## 2014-05-13 DIAGNOSIS — Z7901 Long term (current) use of anticoagulants: Secondary | ICD-10-CM

## 2014-05-13 DIAGNOSIS — I480 Paroxysmal atrial fibrillation: Secondary | ICD-10-CM

## 2014-05-13 DIAGNOSIS — Z5181 Encounter for therapeutic drug level monitoring: Secondary | ICD-10-CM

## 2014-05-13 DIAGNOSIS — I4891 Unspecified atrial fibrillation: Secondary | ICD-10-CM

## 2014-05-13 LAB — POCT INR: INR: 1.6

## 2014-05-22 ENCOUNTER — Ambulatory Visit (INDEPENDENT_AMBULATORY_CARE_PROVIDER_SITE_OTHER): Payer: Medicare Other | Admitting: Ophthalmology

## 2014-05-27 ENCOUNTER — Ambulatory Visit (INDEPENDENT_AMBULATORY_CARE_PROVIDER_SITE_OTHER): Payer: Medicare Other | Admitting: *Deleted

## 2014-05-27 DIAGNOSIS — I4891 Unspecified atrial fibrillation: Secondary | ICD-10-CM

## 2014-05-27 DIAGNOSIS — Z7901 Long term (current) use of anticoagulants: Secondary | ICD-10-CM

## 2014-05-27 DIAGNOSIS — I480 Paroxysmal atrial fibrillation: Secondary | ICD-10-CM

## 2014-05-27 DIAGNOSIS — Z5181 Encounter for therapeutic drug level monitoring: Secondary | ICD-10-CM

## 2014-05-27 LAB — POCT INR: INR: 3.9

## 2014-06-10 ENCOUNTER — Telehealth: Payer: Self-pay | Admitting: *Deleted

## 2014-06-10 ENCOUNTER — Other Ambulatory Visit: Payer: Self-pay | Admitting: Family Medicine

## 2014-06-10 ENCOUNTER — Ambulatory Visit (INDEPENDENT_AMBULATORY_CARE_PROVIDER_SITE_OTHER): Payer: Medicare Other | Admitting: Ophthalmology

## 2014-06-10 DIAGNOSIS — H3532 Exudative age-related macular degeneration: Secondary | ICD-10-CM

## 2014-06-10 DIAGNOSIS — H3531 Nonexudative age-related macular degeneration: Secondary | ICD-10-CM | POA: Diagnosis not present

## 2014-06-10 DIAGNOSIS — H43813 Vitreous degeneration, bilateral: Secondary | ICD-10-CM

## 2014-06-10 DIAGNOSIS — I10 Essential (primary) hypertension: Secondary | ICD-10-CM | POA: Diagnosis not present

## 2014-06-10 DIAGNOSIS — H35033 Hypertensive retinopathy, bilateral: Secondary | ICD-10-CM

## 2014-06-10 NOTE — Telephone Encounter (Signed)
Patient has questions regarding coumadin and a bleed in her eye / tgs

## 2014-06-10 NOTE — Telephone Encounter (Signed)
Pt has macular degeneration in eye and has to have a series of injections by Dr Zigmund Daniel.  Dr Zigmund Daniel told pt it was safe to do these injections on coumadin.  Wanted to make sure it was ok with me.  Told pt this was ok.

## 2014-06-17 ENCOUNTER — Ambulatory Visit (INDEPENDENT_AMBULATORY_CARE_PROVIDER_SITE_OTHER): Payer: Medicare Other | Admitting: *Deleted

## 2014-06-17 DIAGNOSIS — I4891 Unspecified atrial fibrillation: Secondary | ICD-10-CM

## 2014-06-17 DIAGNOSIS — Z5181 Encounter for therapeutic drug level monitoring: Secondary | ICD-10-CM

## 2014-06-17 DIAGNOSIS — I480 Paroxysmal atrial fibrillation: Secondary | ICD-10-CM

## 2014-06-17 DIAGNOSIS — Z7901 Long term (current) use of anticoagulants: Secondary | ICD-10-CM

## 2014-06-17 LAB — POCT INR: INR: 2.4

## 2014-07-02 ENCOUNTER — Telehealth: Payer: Self-pay

## 2014-07-02 NOTE — Telephone Encounter (Signed)
Woke up yesterday with sinus congestion that is clear to yellow/ Dry cough and some hoarsness. I advised to try sudafed for a couple days to see if it got better and she could use robitussin for the cough. If she got worse or developed fever, green phlegm to call back and I would send a message to the Dr since we had no openings. Patient understands and agrees

## 2014-07-08 ENCOUNTER — Ambulatory Visit (INDEPENDENT_AMBULATORY_CARE_PROVIDER_SITE_OTHER): Payer: Medicare Other | Admitting: *Deleted

## 2014-07-08 ENCOUNTER — Encounter: Payer: Self-pay | Admitting: Cardiology

## 2014-07-08 ENCOUNTER — Ambulatory Visit (INDEPENDENT_AMBULATORY_CARE_PROVIDER_SITE_OTHER): Payer: Medicare Other | Admitting: Cardiology

## 2014-07-08 VITALS — BP 148/68 | HR 72 | Ht 66.0 in | Wt 161.0 lb

## 2014-07-08 DIAGNOSIS — R001 Bradycardia, unspecified: Secondary | ICD-10-CM

## 2014-07-08 DIAGNOSIS — I480 Paroxysmal atrial fibrillation: Secondary | ICD-10-CM

## 2014-07-08 DIAGNOSIS — Z7901 Long term (current) use of anticoagulants: Secondary | ICD-10-CM

## 2014-07-08 DIAGNOSIS — I4891 Unspecified atrial fibrillation: Secondary | ICD-10-CM

## 2014-07-08 DIAGNOSIS — I10 Essential (primary) hypertension: Secondary | ICD-10-CM

## 2014-07-08 DIAGNOSIS — Z5181 Encounter for therapeutic drug level monitoring: Secondary | ICD-10-CM

## 2014-07-08 DIAGNOSIS — E785 Hyperlipidemia, unspecified: Secondary | ICD-10-CM

## 2014-07-08 LAB — POCT INR: INR: 2.4

## 2014-07-08 NOTE — Patient Instructions (Signed)
Your physician wants you to follow-up in: [place] will receive a reminder letter in the mail two months in advance. If you don't receive a letter, please call our office to schedule the follow-up appointment.  Your physician recommends that you continue on your current medications as directed. Please refer to the Current Medication list given to you today.  PLEASE MAKE APPOINTMENT FOR THE DEVICE CLINIC  Thank you for choosing [facility]!!

## 2014-07-08 NOTE — Progress Notes (Signed)
Clinical Summary Ms. Pressman is a 80 y.o.female seen today for follow up of the following medical problems.   1. Paroxysmal afib - denies any palpitations - compliant with meds, denies any bleeding issues on coumadin. We have discussed NOACs but she has not been interested.   2. Symptomatic bradycardia - pacemaker with normal function at last device check 11/2013 - reports viral like illness a few days ago with some dizziness that resolved, otherwise has not had any symptoms.  3. HTN - does not checkbp  at home - compliant with meds  4. Hyperlipidemia - compliant with statin, followed by Dr Moshe Cipro - last lipid panel 02/2014 TC 145 TG 280 HDL 37 LDL 52. She endorses eating a lot of sweets and pastries.   Past Medical History  Diagnosis Date  . Hypertension   . Hyperlipidemia   . Pancreatitis   . Gastroesophageal reflux disease   . Impaired glucose tolerance   . Hypothyroidism   . Macular degeneration   . Sleep apnea     Nocturnal oxygen therapy  . Arthritis   . Sick sinus syndrome 2011    Atrial fibrilation 10/2009; and dual chamber pacemaker in 4/11; normal EF  . Paroxysmal atrial fibrillation 2011  . Hearing impairment      No Known Allergies   Current Outpatient Prescriptions  Medication Sig Dispense Refill  . Besifloxacin HCl (BESIVANCE) 0.6 % SUSP Apply 0.6 drops to eye.    . digoxin (LANOXIN) 0.125 MG tablet Take 1 tablet (0.125 mg total) by mouth daily. 30 tablet 6  . fluticasone (FLONASE) 50 MCG/ACT nasal spray USE 1 SPRAY IN EACH NOSTRIL DAILY. 16 g 2  . hydrochlorothiazide (HYDRODIURIL) 25 MG tablet TAKE 1 TABLET BY MOUTH ONCE DAILY. 30 tablet 4  . levothyroxine (SYNTHROID, LEVOTHROID) 100 MCG tablet TAKE 1 TABLET BY MOUTH MONDAY THROUGH FRIDAY AND TAKE 1 AND 1/2 TABLETS ON SATURDAY AND SUNDAY AS DIRECTED FOR THYROID. 34 tablet 4  . lovastatin (MEVACOR) 40 MG tablet TAKE ONE TABLET BY MOUTH DAILY. 30 tablet 3  . metoprolol (LOPRESSOR) 100 MG tablet TAKE  1 TABLET BY MOUTH TWICE A DAY. 60 tablet 4  . multivitamin-lutein (OCUVITE-LUTEIN) CAPS Take 1 capsule by mouth daily.    Marland Kitchen omeprazole (PRILOSEC) 40 MG capsule TAKE (1) CAPSULE BY MOUTH ONCE DAILY FOR ACID REFLUX. 30 capsule 4  . OXYGEN-HELIUM IN Inhale 2 L into the lungs at bedtime.    . Potassium 99 MG TABS Take 99 mg by mouth every morning.     . warfarin (COUMADIN) 5 MG tablet Take 1/2 tablet daily except 1 tablet on Sundays and Wednesdays or as directed 45 tablet 3   No current facility-administered medications for this visit.     Past Surgical History  Procedure Laterality Date  . Abdominal hysterectomy    . Salpingoophorectomy  1970    right  . Cataract extraction      Bilateral  . Pacemaker insertion  2011    dual chamber   . Colonoscopy  2009    Dr. Gala Romney  . Eye surgery      laser      No Known Allergies    Family History  Problem Relation Age of Onset  . Cancer Mother     gential  . Cancer Father     throat   . Pneumonia Sister   . Heart disease    . Arthritis    . Lung disease    . Asthma  Social History Ms. Jezek reports that she has never smoked. She does not have any smokeless tobacco history on file. Ms. Grenda reports that she does not drink alcohol.   Review of Systems CONSTITUTIONAL: No weight loss, fever, chills, weakness or fatigue.  HEENT: Eyes: No visual loss, blurred vision, double vision or yellow sclerae.No hearing loss, sneezing, congestion, runny nose or sore throat.  SKIN: No rash or itching.  CARDIOVASCULAR: per HPI RESPIRATORY: No shortness of breath, cough or sputum.  GASTROINTESTINAL: No anorexia, nausea, vomiting or diarrhea. No abdominal pain or blood.  GENITOURINARY: No burning on urination, no polyuria NEUROLOGICAL: No headache, dizziness, syncope, paralysis, ataxia, numbness or tingling in the extremities. No change in bowel or bladder control.  MUSCULOSKELETAL: No muscle, back pain, joint pain or stiffness.    LYMPHATICS: No enlarged nodes. No history of splenectomy.  PSYCHIATRIC: No history of depression or anxiety.  ENDOCRINOLOGIC: No reports of sweating, cold or heat intolerance. No polyuria or polydipsia.  Marland Kitchen   Physical Examination p 72 bp 148/68 Wt 161 lbs BMI 26 Gen: resting comfortably, no acute distress HEENT: no scleral icterus, pupils equal round and reactive, no palptable cervical adenopathy,  CV: RRR, no m/r/g, no JVD, no carotid bruits Resp: Clear to auscultation bilaterally GI: abdomen is soft, non-tender, non-distended, normal bowel sounds, no hepatosplenomegaly MSK: extremities are warm, no edema.  Skin: warm, no rash Neuro:  no focal deficits Psych: appropriate affect    Assessment and Plan   1. Afib - no significant symptoms - continue current meds  2. HTN - at goal, continue current meds. Goal bp <150/90 is resonable target for her given her age.   3. HL - LDL at goal, continue current statin. Counseled on dietary changes to help decrease TGs  4. Symptomatic bradycardia - normal pacemaker check in 11/2012, continue regular pacemaker follow up. Denies any symptoms.    F/u 1 year  Arnoldo Lenis, M.D.

## 2014-07-13 ENCOUNTER — Encounter (INDEPENDENT_AMBULATORY_CARE_PROVIDER_SITE_OTHER): Payer: Medicare Other | Admitting: Ophthalmology

## 2014-07-13 DIAGNOSIS — I10 Essential (primary) hypertension: Secondary | ICD-10-CM | POA: Diagnosis not present

## 2014-07-13 DIAGNOSIS — H35033 Hypertensive retinopathy, bilateral: Secondary | ICD-10-CM | POA: Diagnosis not present

## 2014-07-13 DIAGNOSIS — H3532 Exudative age-related macular degeneration: Secondary | ICD-10-CM | POA: Diagnosis not present

## 2014-07-13 DIAGNOSIS — H43813 Vitreous degeneration, bilateral: Secondary | ICD-10-CM

## 2014-07-13 DIAGNOSIS — H3531 Nonexudative age-related macular degeneration: Secondary | ICD-10-CM

## 2014-07-17 ENCOUNTER — Ambulatory Visit (INDEPENDENT_AMBULATORY_CARE_PROVIDER_SITE_OTHER): Payer: Medicare Other | Admitting: *Deleted

## 2014-07-17 DIAGNOSIS — I480 Paroxysmal atrial fibrillation: Secondary | ICD-10-CM

## 2014-07-17 DIAGNOSIS — I495 Sick sinus syndrome: Secondary | ICD-10-CM

## 2014-07-17 LAB — MDC_IDC_ENUM_SESS_TYPE_INCLINIC
Battery Remaining Longevity: 26.4 mo
Battery Voltage: 2.87 V
Brady Statistic RA Percent Paced: 92 %
Brady Statistic RV Percent Paced: 0.65 %
Date Time Interrogation Session: 20151120155928
Implantable Pulse Generator Model: 2110
Implantable Pulse Generator Serial Number: 7117341
Lead Channel Impedance Value: 325 Ohm
Lead Channel Impedance Value: 387.5 Ohm
Lead Channel Pacing Threshold Amplitude: 1 V
Lead Channel Pacing Threshold Amplitude: 1 V
Lead Channel Pacing Threshold Amplitude: 1.5 V
Lead Channel Pacing Threshold Amplitude: 1.5 V
Lead Channel Pacing Threshold Pulse Width: 0.5 ms
Lead Channel Pacing Threshold Pulse Width: 0.5 ms
Lead Channel Pacing Threshold Pulse Width: 1.2 ms
Lead Channel Pacing Threshold Pulse Width: 1.2 ms
Lead Channel Sensing Intrinsic Amplitude: 1.2 mV
Lead Channel Setting Pacing Amplitude: 3 V
Lead Channel Setting Pacing Pulse Width: 0.5 ms
MDC IDC MSMT LEADCHNL RV SENSING INTR AMPL: 8.1 mV
MDC IDC SET LEADCHNL RV PACING AMPLITUDE: 2.5 V
MDC IDC SET LEADCHNL RV SENSING SENSITIVITY: 2 mV

## 2014-07-17 NOTE — Progress Notes (Signed)
PPM check in office. 

## 2014-08-05 ENCOUNTER — Ambulatory Visit (INDEPENDENT_AMBULATORY_CARE_PROVIDER_SITE_OTHER): Payer: Medicare Other | Admitting: *Deleted

## 2014-08-05 ENCOUNTER — Encounter: Payer: Self-pay | Admitting: Internal Medicine

## 2014-08-05 DIAGNOSIS — Z7901 Long term (current) use of anticoagulants: Secondary | ICD-10-CM

## 2014-08-05 DIAGNOSIS — Z5181 Encounter for therapeutic drug level monitoring: Secondary | ICD-10-CM

## 2014-08-05 DIAGNOSIS — I480 Paroxysmal atrial fibrillation: Secondary | ICD-10-CM

## 2014-08-05 DIAGNOSIS — I4891 Unspecified atrial fibrillation: Secondary | ICD-10-CM

## 2014-08-05 LAB — POCT INR: INR: 1.5

## 2014-08-06 LAB — COMPREHENSIVE METABOLIC PANEL
ALT: 9 U/L (ref 0–35)
AST: 15 U/L (ref 0–37)
Albumin: 4 g/dL (ref 3.5–5.2)
Alkaline Phosphatase: 66 U/L (ref 39–117)
BILIRUBIN TOTAL: 0.6 mg/dL (ref 0.2–1.2)
BUN: 20 mg/dL (ref 6–23)
CO2: 32 mEq/L (ref 19–32)
CREATININE: 1.03 mg/dL (ref 0.50–1.10)
Calcium: 10 mg/dL (ref 8.4–10.5)
Chloride: 98 mEq/L (ref 96–112)
Glucose, Bld: 104 mg/dL — ABNORMAL HIGH (ref 70–99)
Potassium: 4.2 mEq/L (ref 3.5–5.3)
Sodium: 137 mEq/L (ref 135–145)
Total Protein: 7 g/dL (ref 6.0–8.3)

## 2014-08-06 LAB — LIPID PANEL
CHOL/HDL RATIO: 4.1 ratio
Cholesterol: 161 mg/dL (ref 0–200)
HDL: 39 mg/dL — ABNORMAL LOW (ref >39)
LDL CALC: 62 mg/dL (ref 0–99)
TRIGLYCERIDES: 300 mg/dL — AB (ref <150)
VLDL: 60 mg/dL — AB (ref 0–40)

## 2014-08-06 LAB — TSH: TSH: 1.697 u[IU]/mL (ref 0.350–4.500)

## 2014-08-10 ENCOUNTER — Encounter (INDEPENDENT_AMBULATORY_CARE_PROVIDER_SITE_OTHER): Payer: Medicare Other | Admitting: Ophthalmology

## 2014-08-10 DIAGNOSIS — H35033 Hypertensive retinopathy, bilateral: Secondary | ICD-10-CM | POA: Diagnosis not present

## 2014-08-10 DIAGNOSIS — H3532 Exudative age-related macular degeneration: Secondary | ICD-10-CM | POA: Diagnosis not present

## 2014-08-10 DIAGNOSIS — H3531 Nonexudative age-related macular degeneration: Secondary | ICD-10-CM | POA: Diagnosis not present

## 2014-08-10 DIAGNOSIS — H43813 Vitreous degeneration, bilateral: Secondary | ICD-10-CM

## 2014-08-10 DIAGNOSIS — I10 Essential (primary) hypertension: Secondary | ICD-10-CM

## 2014-08-12 ENCOUNTER — Ambulatory Visit (INDEPENDENT_AMBULATORY_CARE_PROVIDER_SITE_OTHER): Payer: Medicare Other

## 2014-08-12 ENCOUNTER — Encounter: Payer: Self-pay | Admitting: Family Medicine

## 2014-08-12 ENCOUNTER — Ambulatory Visit (INDEPENDENT_AMBULATORY_CARE_PROVIDER_SITE_OTHER): Payer: Medicare Other | Admitting: Family Medicine

## 2014-08-12 ENCOUNTER — Encounter (INDEPENDENT_AMBULATORY_CARE_PROVIDER_SITE_OTHER): Payer: Self-pay

## 2014-08-12 VITALS — BP 130/72 | HR 66 | Resp 16 | Ht 66.0 in | Wt 160.0 lb

## 2014-08-12 DIAGNOSIS — E559 Vitamin D deficiency, unspecified: Secondary | ICD-10-CM

## 2014-08-12 DIAGNOSIS — E785 Hyperlipidemia, unspecified: Secondary | ICD-10-CM

## 2014-08-12 DIAGNOSIS — Z7901 Long term (current) use of anticoagulants: Secondary | ICD-10-CM

## 2014-08-12 DIAGNOSIS — J302 Other seasonal allergic rhinitis: Secondary | ICD-10-CM

## 2014-08-12 DIAGNOSIS — Z23 Encounter for immunization: Secondary | ICD-10-CM

## 2014-08-12 DIAGNOSIS — K219 Gastro-esophageal reflux disease without esophagitis: Secondary | ICD-10-CM

## 2014-08-12 DIAGNOSIS — E039 Hypothyroidism, unspecified: Secondary | ICD-10-CM

## 2014-08-12 DIAGNOSIS — R7301 Impaired fasting glucose: Secondary | ICD-10-CM

## 2014-08-12 DIAGNOSIS — I10 Essential (primary) hypertension: Secondary | ICD-10-CM

## 2014-08-12 NOTE — Patient Instructions (Addendum)
Annual wellness in 4 month, call if you need me before  No change in medication  Please cut back on cookies and cheese  Be careful not to fall  CBC, fasting lipid , cmp TSH, HBA1C, Vit D, TSh

## 2014-08-19 ENCOUNTER — Ambulatory Visit (INDEPENDENT_AMBULATORY_CARE_PROVIDER_SITE_OTHER): Payer: Medicare Other | Admitting: *Deleted

## 2014-08-19 DIAGNOSIS — Z7901 Long term (current) use of anticoagulants: Secondary | ICD-10-CM

## 2014-08-19 DIAGNOSIS — I4891 Unspecified atrial fibrillation: Secondary | ICD-10-CM

## 2014-08-19 DIAGNOSIS — Z5181 Encounter for therapeutic drug level monitoring: Secondary | ICD-10-CM

## 2014-08-19 DIAGNOSIS — I480 Paroxysmal atrial fibrillation: Secondary | ICD-10-CM

## 2014-08-19 LAB — POCT INR: INR: 2.7

## 2014-08-28 DIAGNOSIS — B029 Zoster without complications: Secondary | ICD-10-CM

## 2014-08-28 HISTORY — DX: Zoster without complications: B02.9

## 2014-09-01 DIAGNOSIS — Z23 Encounter for immunization: Secondary | ICD-10-CM | POA: Insufficient documentation

## 2014-09-01 NOTE — Assessment & Plan Note (Signed)
Increased TG, Hyperlipidemia:Low fat diet discussed and encouraged.  No change in med

## 2014-09-01 NOTE — Assessment & Plan Note (Signed)
Followed by coumadin clinic , no h/o epistaxis, hematuria or excess bleed

## 2014-09-01 NOTE — Assessment & Plan Note (Signed)
Controlled, no change in medication  

## 2014-09-01 NOTE — Progress Notes (Signed)
   Subjective:    Patient ID: Marijean Niemann, female    DOB: 1926/02/07, 81 y.o.   MRN: [id number]  HPI The PT is here for follow up and re-evaluation of chronic medical conditions, medication management and review of any available recent lab and radiology data.  Preventive health is updated, specifically  Cancer screening and Immunization.    The PT denies any adverse reactions to current medications since the last visit.  There are no new concerns.  There are no specific complaints       Review of Systems See HPI Denies recent fever or chills. Denies sinus pressure, nasal congestion, ear pain or sore throat. C/o chest congestion, denies  productive cough or wheezing. Denies chest pains, palpitations and leg swelling Denies abdominal pain, nausea, vomiting,diarrhea or constipation.   Denies dysuria, frequency, hesitancy or incontinence. Denies joint pain, swelling and limitation in mobility. Denies headaches, seizures, numbness, or tingling. Denies depression, anxiety or insomnia. Denies skin break down or rash.        Objective:   Physical Exam BP 130/72 mmHg  Pulse 66  Resp 16  Ht 5\' 6"  (1.676 m)  Wt 160 lb (72.576 kg)  BMI 25.84 kg/m2  SpO2 98% Patient alert and oriented and in no cardiopulmonary distress.  HEENT: No facial asymmetry, EOMI,   oropharynx pink and moist.  Neck supple no JVD, no mass.  Chest: Clear to auscultation bilaterally.Decreased though adequate air entry  CVS: S1, S2 no murmurs, no S3.Regular rate.  ABD: Soft non tender.   Ext: No edema  MS: decreased though adeqaute  ROM spine, shoulders, hips and knees.  Skin: Intact, no ulcerations or rash noted.  Psych: Good eye contact, normal affect. Memory intact not anxious or depressed appearing.  CNS: CN 2-12 intact, power,  normal throughout.no focal deficits noted.        Assessment & Plan:  Essential hypertension Controlled, no change in medication   GERD (gastroesophageal  reflux disease) Controlled, no change in medication   Hypothyroidism Controlled, no change in medication   Hyperlipidemia Increased TG, Hyperlipidemia:Low fat diet discussed and encouraged.  No change in med  Chronic anticoagulation Followed by coumadin clinic , no h/o epistaxis, hematuria or excess bleed   Need for prophylactic vaccination and inoculation against influenza Vaccine administered at visit.

## 2014-09-01 NOTE — Assessment & Plan Note (Signed)
Vaccine administered at visit.  

## 2014-09-02 DIAGNOSIS — R0902 Hypoxemia: Secondary | ICD-10-CM | POA: Diagnosis not present

## 2014-09-07 ENCOUNTER — Encounter (INDEPENDENT_AMBULATORY_CARE_PROVIDER_SITE_OTHER): Payer: Medicare Other | Admitting: Ophthalmology

## 2014-09-07 DIAGNOSIS — H3531 Nonexudative age-related macular degeneration: Secondary | ICD-10-CM | POA: Diagnosis not present

## 2014-09-07 DIAGNOSIS — I10 Essential (primary) hypertension: Secondary | ICD-10-CM

## 2014-09-07 DIAGNOSIS — H3532 Exudative age-related macular degeneration: Secondary | ICD-10-CM

## 2014-09-07 DIAGNOSIS — H35033 Hypertensive retinopathy, bilateral: Secondary | ICD-10-CM

## 2014-09-07 DIAGNOSIS — H43813 Vitreous degeneration, bilateral: Secondary | ICD-10-CM | POA: Diagnosis not present

## 2014-09-09 ENCOUNTER — Ambulatory Visit (INDEPENDENT_AMBULATORY_CARE_PROVIDER_SITE_OTHER): Payer: Medicare Other | Admitting: *Deleted

## 2014-09-09 DIAGNOSIS — Z5181 Encounter for therapeutic drug level monitoring: Secondary | ICD-10-CM | POA: Diagnosis not present

## 2014-09-09 DIAGNOSIS — I4891 Unspecified atrial fibrillation: Secondary | ICD-10-CM

## 2014-09-09 DIAGNOSIS — I480 Paroxysmal atrial fibrillation: Secondary | ICD-10-CM | POA: Diagnosis not present

## 2014-09-09 DIAGNOSIS — Z7901 Long term (current) use of anticoagulants: Secondary | ICD-10-CM | POA: Diagnosis not present

## 2014-09-09 LAB — POCT INR: INR: 2.3

## 2014-09-25 ENCOUNTER — Other Ambulatory Visit: Payer: Self-pay | Admitting: Family Medicine

## 2014-10-03 DIAGNOSIS — R0902 Hypoxemia: Secondary | ICD-10-CM | POA: Diagnosis not present

## 2014-10-10 ENCOUNTER — Other Ambulatory Visit: Payer: Self-pay | Admitting: Family Medicine

## 2014-10-14 ENCOUNTER — Ambulatory Visit (INDEPENDENT_AMBULATORY_CARE_PROVIDER_SITE_OTHER): Payer: Medicare Other | Admitting: *Deleted

## 2014-10-14 DIAGNOSIS — Z5181 Encounter for therapeutic drug level monitoring: Secondary | ICD-10-CM

## 2014-10-14 DIAGNOSIS — I4891 Unspecified atrial fibrillation: Secondary | ICD-10-CM

## 2014-10-14 DIAGNOSIS — Z7901 Long term (current) use of anticoagulants: Secondary | ICD-10-CM | POA: Diagnosis not present

## 2014-10-14 DIAGNOSIS — I480 Paroxysmal atrial fibrillation: Secondary | ICD-10-CM | POA: Diagnosis not present

## 2014-10-14 LAB — POCT INR: INR: 2.9

## 2014-10-19 ENCOUNTER — Encounter (INDEPENDENT_AMBULATORY_CARE_PROVIDER_SITE_OTHER): Payer: Medicare Other | Admitting: Ophthalmology

## 2014-10-19 DIAGNOSIS — H35033 Hypertensive retinopathy, bilateral: Secondary | ICD-10-CM | POA: Diagnosis not present

## 2014-10-19 DIAGNOSIS — H3531 Nonexudative age-related macular degeneration: Secondary | ICD-10-CM | POA: Diagnosis not present

## 2014-10-19 DIAGNOSIS — H43813 Vitreous degeneration, bilateral: Secondary | ICD-10-CM | POA: Diagnosis not present

## 2014-10-19 DIAGNOSIS — I10 Essential (primary) hypertension: Secondary | ICD-10-CM | POA: Diagnosis not present

## 2014-10-19 DIAGNOSIS — H3532 Exudative age-related macular degeneration: Secondary | ICD-10-CM | POA: Diagnosis not present

## 2014-10-26 ENCOUNTER — Other Ambulatory Visit: Payer: Self-pay | Admitting: Internal Medicine

## 2014-11-01 DIAGNOSIS — R0902 Hypoxemia: Secondary | ICD-10-CM | POA: Diagnosis not present

## 2014-11-25 ENCOUNTER — Ambulatory Visit (INDEPENDENT_AMBULATORY_CARE_PROVIDER_SITE_OTHER): Payer: Medicare Other | Admitting: *Deleted

## 2014-11-25 DIAGNOSIS — I4891 Unspecified atrial fibrillation: Secondary | ICD-10-CM | POA: Diagnosis not present

## 2014-11-25 DIAGNOSIS — Z5181 Encounter for therapeutic drug level monitoring: Secondary | ICD-10-CM

## 2014-11-25 DIAGNOSIS — Z7901 Long term (current) use of anticoagulants: Secondary | ICD-10-CM

## 2014-11-25 DIAGNOSIS — I480 Paroxysmal atrial fibrillation: Secondary | ICD-10-CM

## 2014-11-25 LAB — POCT INR: INR: 2.4

## 2014-11-26 ENCOUNTER — Other Ambulatory Visit: Payer: Self-pay | Admitting: Family Medicine

## 2014-11-30 ENCOUNTER — Other Ambulatory Visit: Payer: Self-pay | Admitting: Family Medicine

## 2014-11-30 DIAGNOSIS — Z1231 Encounter for screening mammogram for malignant neoplasm of breast: Secondary | ICD-10-CM

## 2014-12-02 DIAGNOSIS — R0902 Hypoxemia: Secondary | ICD-10-CM | POA: Diagnosis not present

## 2014-12-14 ENCOUNTER — Encounter (INDEPENDENT_AMBULATORY_CARE_PROVIDER_SITE_OTHER): Payer: Medicare Other | Admitting: Ophthalmology

## 2014-12-14 DIAGNOSIS — H43813 Vitreous degeneration, bilateral: Secondary | ICD-10-CM

## 2014-12-14 DIAGNOSIS — H35033 Hypertensive retinopathy, bilateral: Secondary | ICD-10-CM

## 2014-12-14 DIAGNOSIS — I10 Essential (primary) hypertension: Secondary | ICD-10-CM | POA: Diagnosis not present

## 2014-12-14 DIAGNOSIS — H3532 Exudative age-related macular degeneration: Secondary | ICD-10-CM

## 2014-12-14 DIAGNOSIS — E785 Hyperlipidemia, unspecified: Secondary | ICD-10-CM | POA: Diagnosis not present

## 2014-12-14 DIAGNOSIS — E559 Vitamin D deficiency, unspecified: Secondary | ICD-10-CM | POA: Diagnosis not present

## 2014-12-14 DIAGNOSIS — H3531 Nonexudative age-related macular degeneration: Secondary | ICD-10-CM | POA: Diagnosis not present

## 2014-12-14 DIAGNOSIS — R7301 Impaired fasting glucose: Secondary | ICD-10-CM | POA: Diagnosis not present

## 2014-12-15 LAB — LIPID PANEL
Cholesterol: 154 mg/dL (ref 0–200)
HDL: 38 mg/dL — ABNORMAL LOW (ref >=46)
LDL CALC: 61 mg/dL (ref 0–99)
Total CHOL/HDL Ratio: 4.1 Ratio
Triglycerides: 277 mg/dL — ABNORMAL HIGH (ref <150)
VLDL: 55 mg/dL — ABNORMAL HIGH (ref 0–40)

## 2014-12-15 LAB — COMPREHENSIVE METABOLIC PANEL
ALT: 12 U/L (ref 0–35)
AST: 17 U/L (ref 0–37)
Albumin: 3.9 g/dL (ref 3.5–5.2)
Alkaline Phosphatase: 72 U/L (ref 39–117)
BILIRUBIN TOTAL: 0.6 mg/dL (ref 0.2–1.2)
BUN: 17 mg/dL (ref 6–23)
CO2: 32 mEq/L (ref 19–32)
CREATININE: 0.91 mg/dL (ref 0.50–1.10)
Calcium: 9.5 mg/dL (ref 8.4–10.5)
Chloride: 98 mEq/L (ref 96–112)
Glucose, Bld: 90 mg/dL (ref 70–99)
Potassium: 3.8 mEq/L (ref 3.5–5.3)
Sodium: 137 mEq/L (ref 135–145)
Total Protein: 7.3 g/dL (ref 6.0–8.3)

## 2014-12-15 LAB — CBC WITH DIFFERENTIAL/PLATELET
BASOS ABS: 0.1 10*3/uL (ref 0.0–0.1)
Basophils Relative: 1 % (ref 0–1)
EOS ABS: 0.4 10*3/uL (ref 0.0–0.7)
EOS PCT: 4 % (ref 0–5)
HEMATOCRIT: 35.2 % — AB (ref 36.0–46.0)
HEMOGLOBIN: 11.6 g/dL — AB (ref 12.0–15.0)
Lymphocytes Relative: 37 % (ref 12–46)
Lymphs Abs: 3.3 10*3/uL (ref 0.7–4.0)
MCH: 29 pg (ref 26.0–34.0)
MCHC: 33 g/dL (ref 30.0–36.0)
MCV: 88 fL (ref 78.0–100.0)
MONOS PCT: 10 % (ref 3–12)
MPV: 8.9 fL — ABNORMAL LOW (ref 9.4–12.4)
Monocytes Absolute: 0.9 10*3/uL (ref 0.1–1.0)
Neutro Abs: 4.3 10*3/uL (ref 1.7–7.7)
Neutrophils Relative %: 48 % (ref 43–77)
Platelets: 303 10*3/uL (ref 150–400)
RBC: 4 MIL/uL (ref 3.87–5.11)
RDW: 13.5 % (ref 11.5–15.5)
WBC: 9 10*3/uL (ref 4.0–10.5)

## 2014-12-15 LAB — VITAMIN D 25 HYDROXY (VIT D DEFICIENCY, FRACTURES): VIT D 25 HYDROXY: 44 ng/mL (ref 30–100)

## 2014-12-15 LAB — HEMOGLOBIN A1C
Hgb A1c MFr Bld: 5.7 % — ABNORMAL HIGH (ref <5.7)
Mean Plasma Glucose: 117 mg/dL — ABNORMAL HIGH (ref <117)

## 2014-12-15 LAB — TSH: TSH: 6.438 u[IU]/mL — AB (ref 0.350–4.500)

## 2014-12-17 ENCOUNTER — Ambulatory Visit (INDEPENDENT_AMBULATORY_CARE_PROVIDER_SITE_OTHER): Payer: Medicare Other | Admitting: Family Medicine

## 2014-12-17 ENCOUNTER — Encounter: Payer: Self-pay | Admitting: Family Medicine

## 2014-12-17 VITALS — BP 130/70 | HR 62 | Resp 16 | Ht 66.0 in | Wt 160.8 lb

## 2014-12-17 DIAGNOSIS — E039 Hypothyroidism, unspecified: Secondary | ICD-10-CM

## 2014-12-17 DIAGNOSIS — Z0000 Encounter for general adult medical examination without abnormal findings: Secondary | ICD-10-CM

## 2014-12-17 MED ORDER — LEVOTHYROXINE SODIUM 100 MCG PO TABS: ORAL_TABLET | ORAL | Status: DC

## 2014-12-17 NOTE — Progress Notes (Signed)
Subjective:    Patient ID: [name], female    DOB: [birth year], 81 y.o.   MRN: [id number]  HPI  Preventive Screening-Counseling & Management   Patient present here today for a Medicare annual wellness visit.   Current Problems (verified)   Medications Prior to Visit Allergies (verified)   PAST HISTORY  Family History (verified)   Social History  Married twice, currently a widow. 7 children, 1 brother deceased from cancer    Risk Factors  Current exercise habits:  Going to start going to the [facility]. Works around the yard   Dietary issues discussed: limits fried fatty foods and eats mainly fruits and vegetables    Cardiac risk factors:  Pacemaker   Depression Screen  (Note: if answer to either of the following is "Yes", a more complete depression screening is indicated)   Over the past two weeks, have you felt down, depressed or hopeless? No  Over the past two weeks, have you felt little interest or pleasure in doing things? No  Have you lost interest or pleasure in daily life? Some, due to domestic issues  Do you often feel hopeless? No  Do you cry easily over simple problems?    Activities of Daily Living  In your present state of health, do you have any difficulty performing the following activities?  Driving?: doesn't drive  Managing money?: No Feeding yourself?:No Getting from bed to chair?: uses cane  Climbing a flight of stairs?: states she has to take her time but does climb stairs  Preparing food and eating?:cooks her breakfast, daughters cook for her mainly  Bathing or showering?:No Getting dressed?:No Getting to the toilet?:No Using the toilet?:No Moving around from place to place?: No, uses cane. No falls in the past year   Fall Risk Assessment In the past year have you fallen or had a near fall?:No Are you currently taking any medications that make you dizzy?:No   Hearing Difficulties: No Do you often ask people to speak up or repeat  themselves?:No Do you experience ringing or noises in your ears?:No Do you have difficulty understanding soft or whispered voices?:No  Cognitive Testing  Alert? Yes Normal Appearance?Yes  Oriented to person? Yes Place? Yes  Time? Yes  Displays appropriate judgment?Yes  Can read the correct time from a watch face? yes Are you having problems remembering things?No  Advanced Directives have been discussed with the patient? Has started but it isn't completed yet     List the Names of Other Physician/Practitioners you currently use:  Dr Harl Bowie _cardiology Dr Zigmund Daniel opthl Dr Luan Pulling pulmonary   Indicate any recent Medical Services you may have received from other than [facility] providers in the past year (date may be approximate).   Assessment:    Annual Wellness Exam   Plan:     Medicare Attestation  I have personally reviewed:  The patient's medical and social history  Their use of alcohol, tobacco or illicit drugs  Their current medications and supplements  The patient's functional ability including ADLs,fall risks, home safety risks, cognitive, and hearing and visual impairment  Diet and physical activities  Evidence for depression or mood disorders  The patient's weight, height, BMI, and visual acuity have been recorded in the chart. I have made referrals, counseling, and provided education to the patient based on review of the above and I have provided the patient with a written personalized care plan for preventive services.     Review of Systems  Objective:   Physical Exam BP 130/70 mmHg  Pulse 62  Resp 16  Ht 5\' 6"  (1.676 m)  Wt 160 lb 12.8 oz (72.938 kg)  BMI 25.97 kg/m2  SpO2 98%        Assessment & Plan:  Medicare annual wellness visit, subsequent Annual exam as documented. Counseling done  re healthy lifestyle involving commitment to 150 minutes exercise per week, heart healthy diet, and attaining healthy weight.The importance of adequate sleep  also discussed. Regular seat belt use and home safety, is also discussed. Changes in health habits are decided on by the patient with goals and time frames  set for achieving them. Immunization needs are specifically addressed at this visit.

## 2014-12-17 NOTE — Patient Instructions (Addendum)
Annual physical exam in 4  month, call if you need me before  Please  Check pharmacy for shingles vaccine  Increase thyroid med dose to one and a half tablets, on Thursday, Friday , Saturday and Sunday, continue one tablet once daily the other three days  Rept TSH in 8 weeks You will get most form and number to call [facility] to get help with end of life wishes  Congrats, excellent labs, cholesterol is better  Enjoy silver sneakers, and continue to use your cane and not fall  Thanks for choosing [facility], we consider it a privelige to serve you.

## 2014-12-20 NOTE — Assessment & Plan Note (Addendum)
Annual exam as documented. Counseling done  re healthy lifestyle involving commitment to 150 minutes exercise per week, heart healthy diet, and attaining healthy weight.The importance of adequate sleep also discussed. Regular seat belt use and home safety, is also discussed. Changes in health habits are decided on by the patient with goals and time frames  set for achieving them. Immunization needs are specifically addressed at this visit.  

## 2014-12-21 ENCOUNTER — Telehealth: Payer: Self-pay

## 2014-12-21 ENCOUNTER — Other Ambulatory Visit: Payer: Self-pay

## 2014-12-21 MED ORDER — CYCLOBENZAPRINE HCL 10 MG PO TABS: 10.0000 mg | ORAL_TABLET | Freq: Every evening | ORAL | Status: DC | PRN

## 2014-12-21 MED ORDER — PREDNISONE 5 MG (21) PO TBPK: ORAL_TABLET | ORAL | Status: DC

## 2014-12-21 MED ORDER — PREDNISONE 5 MG PO TABS: 5.0000 mg | ORAL_TABLET | Freq: Two times a day (BID) | ORAL | Status: DC

## 2014-12-21 NOTE — Telephone Encounter (Signed)
Prednisone dose pak sent and daughter aware.

## 2014-12-21 NOTE — Telephone Encounter (Signed)
Prednisone kit changed to BID #10.  Pharmacy aware.  Flexeril 35m qhs prn #10 sent to pharmacy

## 2014-12-21 NOTE — Telephone Encounter (Signed)
Noted and agree. 

## 2014-12-24 ENCOUNTER — Encounter: Payer: Self-pay | Admitting: Family Medicine

## 2014-12-24 ENCOUNTER — Ambulatory Visit (INDEPENDENT_AMBULATORY_CARE_PROVIDER_SITE_OTHER): Payer: Medicare Other | Admitting: Family Medicine

## 2014-12-24 VITALS — BP 140/78 | HR 81 | Resp 16 | Ht 66.0 in | Wt 160.0 lb

## 2014-12-24 DIAGNOSIS — E038 Other specified hypothyroidism: Secondary | ICD-10-CM

## 2014-12-24 DIAGNOSIS — M542 Cervicalgia: Secondary | ICD-10-CM | POA: Diagnosis not present

## 2014-12-24 DIAGNOSIS — R0789 Other chest pain: Secondary | ICD-10-CM | POA: Diagnosis not present

## 2014-12-24 DIAGNOSIS — M25522 Pain in left elbow: Secondary | ICD-10-CM

## 2014-12-24 DIAGNOSIS — M25512 Pain in left shoulder: Secondary | ICD-10-CM | POA: Diagnosis not present

## 2014-12-24 DIAGNOSIS — I10 Essential (primary) hypertension: Secondary | ICD-10-CM

## 2014-12-24 NOTE — Patient Instructions (Signed)
F/u as before  Lungs are clear and EKG shows no acute heart damage  Pain in neck , shoulder and upper arm I believe is from strain after you lifted laundry, x rays today I will prescribe topical rub for pain

## 2014-12-28 ENCOUNTER — Ambulatory Visit (HOSPITAL_COMMUNITY)
Admission: RE | Admit: 2014-12-28 | Discharge: 2014-12-28 | Disposition: A | Discharge status: Home / Self Care | Payer: Medicare Other | Source: Ambulatory Visit | Attending: Family Medicine | Admitting: Family Medicine

## 2014-12-28 ENCOUNTER — Other Ambulatory Visit: Payer: Self-pay | Admitting: Family Medicine

## 2014-12-28 DIAGNOSIS — M21922 Unspecified acquired deformity of left upper arm: Secondary | ICD-10-CM | POA: Diagnosis not present

## 2014-12-28 DIAGNOSIS — M25522 Pain in left elbow: Secondary | ICD-10-CM | POA: Diagnosis not present

## 2014-12-28 DIAGNOSIS — M4802 Spinal stenosis, cervical region: Secondary | ICD-10-CM | POA: Diagnosis not present

## 2014-12-28 DIAGNOSIS — M47812 Spondylosis without myelopathy or radiculopathy, cervical region: Secondary | ICD-10-CM | POA: Diagnosis not present

## 2014-12-28 DIAGNOSIS — M19012 Primary osteoarthritis, left shoulder: Secondary | ICD-10-CM | POA: Diagnosis not present

## 2014-12-28 DIAGNOSIS — M25512 Pain in left shoulder: Secondary | ICD-10-CM | POA: Diagnosis not present

## 2014-12-28 DIAGNOSIS — M542 Cervicalgia: Secondary | ICD-10-CM | POA: Insufficient documentation

## 2014-12-28 DIAGNOSIS — M25521 Pain in right elbow: Secondary | ICD-10-CM

## 2015-01-01 DIAGNOSIS — R0902 Hypoxemia: Secondary | ICD-10-CM | POA: Diagnosis not present

## 2015-01-05 ENCOUNTER — Ambulatory Visit (HOSPITAL_COMMUNITY)
Admission: RE | Admit: 2015-01-05 | Discharge: 2015-01-05 | Disposition: A | Discharge status: Home / Self Care | Payer: Medicare Other | Source: Ambulatory Visit | Attending: Family Medicine | Admitting: Family Medicine

## 2015-01-05 DIAGNOSIS — M25521 Pain in right elbow: Secondary | ICD-10-CM | POA: Diagnosis not present

## 2015-01-05 DIAGNOSIS — M21922 Unspecified acquired deformity of left upper arm: Secondary | ICD-10-CM | POA: Diagnosis not present

## 2015-01-06 ENCOUNTER — Ambulatory Visit (INDEPENDENT_AMBULATORY_CARE_PROVIDER_SITE_OTHER): Payer: Medicare Other | Admitting: *Deleted

## 2015-01-06 DIAGNOSIS — Z7901 Long term (current) use of anticoagulants: Secondary | ICD-10-CM

## 2015-01-06 DIAGNOSIS — I4891 Unspecified atrial fibrillation: Secondary | ICD-10-CM

## 2015-01-06 DIAGNOSIS — I480 Paroxysmal atrial fibrillation: Secondary | ICD-10-CM | POA: Diagnosis not present

## 2015-01-06 LAB — POCT INR: INR: 2.6

## 2015-01-08 ENCOUNTER — Encounter: Payer: Self-pay | Admitting: Family Medicine

## 2015-01-08 ENCOUNTER — Ambulatory Visit (HOSPITAL_COMMUNITY): Payer: Medicare Other

## 2015-01-27 DIAGNOSIS — Z950 Presence of cardiac pacemaker: Secondary | ICD-10-CM | POA: Diagnosis not present

## 2015-01-27 DIAGNOSIS — J441 Chronic obstructive pulmonary disease with (acute) exacerbation: Secondary | ICD-10-CM | POA: Diagnosis not present

## 2015-01-27 DIAGNOSIS — I10 Essential (primary) hypertension: Secondary | ICD-10-CM | POA: Diagnosis not present

## 2015-01-27 DIAGNOSIS — G4730 Sleep apnea, unspecified: Secondary | ICD-10-CM | POA: Diagnosis not present

## 2015-01-29 ENCOUNTER — Ambulatory Visit (HOSPITAL_COMMUNITY): Payer: Medicare Other

## 2015-02-01 DIAGNOSIS — R0902 Hypoxemia: Secondary | ICD-10-CM | POA: Diagnosis not present

## 2015-02-04 NOTE — Progress Notes (Signed)
   Subjective:    Patient ID: [name], female    DOB: [birth year], 81 y.o.   MRN: [id number]  HPI H/o increased left sided neck, chest and upper extremity pain to elbow x 5 days, following lifting a load of laundry OTC pain meds have not relieved symptoms and muscle relaxant made her whoozey, daughter called i again wit concerns Pt is experiencing increased difficulty in raising shoulder , and also c/o left elbow pain    Review of Systems    See HPI Denies recent fever or chills. Denies sinus pressure, nasal congestion, ear pain or sore throat. Denies chest congestion, productive cough or wheezing. Denies PND , orthopneains, palpitations and leg swelling Denies abdominal pain, nausea, vomiting,diarrhea or constipation.   Denies dysuria, frequency, hesitancy or incontinence.  Denies headaches, seizures, numbness, or tingling. Denies depression, anxiety or insomnia. Denies skin break down or rash.     Objective:   Physical Exam BP 140/78 mmHg  Pulse 81  Resp 16  Ht 5\' 6"  (1.676 m)  Wt 160 lb (72.576 kg)  BMI 25.84 kg/m2  SpO2 97% Patient alert and oriented and in no cardiopulmonary distress.  HEENT: No facial asymmetry, EOMI,   oropharynx pink and moist.  Neck decreased ROM no JVD, no mass.  Chest: Clear to auscultation bilaterally.No chest wall tenderness  CVS: S1, S2 no murmurs, no S3.Regular rate.  ABD: Soft non tender.   Ext: No edema  [id number] ROM spine , shoulder , hips and knees, more marked limitation in left shoulder and left elbow Skin: Intact, no ulcerations or rash noted.  Psych: Good eye contact, normal affect. Memory intact not anxious or depressed appearing.  CNS: CN 2-12 intact, power,  normal throughout.no focal deficits noted.        Assessment & Plan:  Atypical chest pain Office EKG shown no acute , and no chnagew when compared to EKG from 1 year before, pt and daughter reassures that there is no acute cardiac event  Neck pain  on left side Increased pain following strain of lifting  Laundry, likely due to severe arthritis in neck, xray of c spine and topical anti inflammatory  Shoulder pain, left Acute pain with decreased ROM, no direct trauma, pt has severe generalized osteoarthritis, xray of shoulder   Left elbow pain Tenderness and reduced mobility of elbow, xray today, and topical pain med  Essential hypertension Controlled, no change in medication   Hypothyroidism Med adjustment due to under corrected with 6 to 8 week f/u testing

## 2015-02-05 DIAGNOSIS — R0789 Other chest pain: Secondary | ICD-10-CM | POA: Insufficient documentation

## 2015-02-05 NOTE — Assessment & Plan Note (Signed)
Med adjustment due to under corrected with 6 to 8 week f/u testing

## 2015-02-05 NOTE — Assessment & Plan Note (Signed)
Office EKG shown no acute , and no chnagew when compared to EKG from 1 year before, pt and daughter reassures that there is no acute cardiac event

## 2015-02-05 NOTE — Assessment & Plan Note (Signed)
Increased pain following strain of lifting  Laundry, likely due to severe arthritis in neck, xray of c spine and topical anti inflammatory

## 2015-02-05 NOTE — Assessment & Plan Note (Signed)
Acute pain with decreased ROM, no direct trauma, pt has severe generalized osteoarthritis, xray of shoulder

## 2015-02-05 NOTE — Assessment & Plan Note (Signed)
Controlled, no change in medication  

## 2015-02-05 NOTE — Assessment & Plan Note (Signed)
Tenderness and reduced mobility of elbow, xray today, and topical pain med

## 2015-02-08 ENCOUNTER — Encounter: Payer: Self-pay | Admitting: Internal Medicine

## 2015-02-08 ENCOUNTER — Ambulatory Visit (INDEPENDENT_AMBULATORY_CARE_PROVIDER_SITE_OTHER): Payer: Medicare Other | Admitting: Internal Medicine

## 2015-02-08 VITALS — BP 126/60 | HR 66 | Ht 65.0 in | Wt 153.0 lb

## 2015-02-08 DIAGNOSIS — Z950 Presence of cardiac pacemaker: Secondary | ICD-10-CM

## 2015-02-08 DIAGNOSIS — I495 Sick sinus syndrome: Secondary | ICD-10-CM | POA: Diagnosis not present

## 2015-02-08 DIAGNOSIS — I480 Paroxysmal atrial fibrillation: Secondary | ICD-10-CM | POA: Diagnosis not present

## 2015-02-08 LAB — CUP PACEART INCLINIC DEVICE CHECK
Battery Remaining Longevity: 18 mo
Battery Voltage: 2.83 V
Brady Statistic RA Percent Paced: 93 %
Brady Statistic RV Percent Paced: 0.24 %
Date Time Interrogation Session: 20160613093918
Lead Channel Pacing Threshold Amplitude: 1.5 V
Lead Channel Sensing Intrinsic Amplitude: 1.3 mV
Lead Channel Setting Pacing Amplitude: 2.5 V
Lead Channel Setting Pacing Amplitude: 3 V
Lead Channel Setting Pacing Pulse Width: 0.5 ms
MDC IDC MSMT LEADCHNL RA IMPEDANCE VALUE: 362.5 Ohm
MDC IDC MSMT LEADCHNL RA PACING THRESHOLD PULSEWIDTH: 1.2 ms
MDC IDC MSMT LEADCHNL RV IMPEDANCE VALUE: 400 Ohm
MDC IDC MSMT LEADCHNL RV PACING THRESHOLD AMPLITUDE: 1.25 V
MDC IDC MSMT LEADCHNL RV PACING THRESHOLD PULSEWIDTH: 0.5 ms
MDC IDC MSMT LEADCHNL RV SENSING INTR AMPL: 10.2 mV
MDC IDC SET LEADCHNL RV SENSING SENSITIVITY: 2 mV
Pulse Gen Serial Number: 7117341

## 2015-02-08 NOTE — Assessment & Plan Note (Signed)
Her [employer] DDD PM is working normally. Will recheck in several months.  

## 2015-02-08 NOTE — Patient Instructions (Signed)
Your physician wants you to follow-up in: 12 month with Dr. Lovena Le. You will receive a reminder letter in the mail two months in advance. If you don't receive a letter, please call our office to schedule the follow-up appointment.  Your physician wants you to follow-up in: 6 months with the [facility]. You will receive a reminder letter in the mail two months in advance. If you don't receive a letter, please call our office to schedule the follow-up appointment.  Your physician recommends that you continue on your current medications as directed. Please refer to the Current Medication list given to you today.  Thank you for choosing [facility]!

## 2015-02-08 NOTE — Assessment & Plan Note (Signed)
She is maintaining NSR approx. 99% of the time. She will continue her current meds.

## 2015-02-08 NOTE — Assessment & Plan Note (Signed)
Her blood pressure is well controlled. No change in medications.

## 2015-02-08 NOTE — Progress Notes (Signed)
HPI [name] returns today for followup. She is a very pleasant 81 year old woman with a history of symptomatic bradycardia, status post permanent pacemaker insertion, hypertension, and paroxysmal atrial fibrillation. In the interim, she has been stable. She denies chest pain but has had some shortness of breath. No syncope. No peripheral edema. She has a history of falls but has had none recently. Her atrial lead has an elevated threshold. She admits to sodium indiscretion. No Known Allergies   Current Outpatient Prescriptions  Medication Sig Dispense Refill  . Besifloxacin HCl (BESIVANCE) 0.6 % SUSP Apply 0.6 drops to eye.    Marland Kitchen DIGOX 125 MCG tablet TAKE 1 TABLET BY MOUTH ONCE A DAY. 30 tablet 3  . fluticasone (FLONASE) 50 MCG/ACT nasal spray USE 1 SPRAY IN EACH NOSTRIL DAILY. 16 g 2  . hydrochlorothiazide (HYDRODIURIL) 25 MG tablet TAKE 1 TABLET BY MOUTH ONCE DAILY. 30 tablet 3  . levothyroxine (SYNTHROID, LEVOTHROID) 100 MCG tablet TAKE 1 1/2 TABLETS BY MOUTH on THURS, FRI, SAT AND SUN. 1 TABLET ALL OTHER DAYS 35 tablet 3  . lovastatin (MEVACOR) 40 MG tablet TAKE ONE TABLET BY MOUTH DAILY. 30 tablet 4  . metoprolol (LOPRESSOR) 100 MG tablet TAKE 1 TABLET BY MOUTH TWICE A DAY. 60 tablet 3  . multivitamin-lutein (OCUVITE-LUTEIN) CAPS Take 1 capsule by mouth daily.    Marland Kitchen omeprazole (PRILOSEC) 40 MG capsule TAKE (1) CAPSULE BY MOUTH ONCE DAILY FOR ACID REFLUX. 30 capsule 3  . OXYGEN-HELIUM IN Inhale 2 L into the lungs at bedtime.    . Potassium 99 MG TABS Take 99 mg by mouth every morning.     . warfarin (COUMADIN) 5 MG tablet TAKE 1/2 TABLET BY MOUTH DAILY EXCEPT 1 TABLET ON SUNDAYS AND WEDNESDAYS OR AS DIRECTED. 45 tablet 3   No current facility-administered medications for this visit.     Past Medical History  Diagnosis Date  . Hypertension   . Hyperlipidemia   . Pancreatitis   . Gastroesophageal reflux disease   . Impaired glucose tolerance   . Hypothyroidism   . Macular  degeneration   . Sleep apnea     Nocturnal oxygen therapy  . Arthritis   . Sick sinus syndrome 2011    Atrial fibrilation 10/2009; and dual chamber pacemaker in 4/11; normal EF  . Paroxysmal atrial fibrillation 2011  . Hearing impairment     ROS:   All systems reviewed and negative except as noted in the HPI.   Past Surgical History  Procedure Laterality Date  . Abdominal hysterectomy    . Salpingoophorectomy  1970    right  . Cataract extraction      Bilateral  . Pacemaker insertion  2011    dual chamber   . Colonoscopy  2009    Dr. Gala Romney  . Eye surgery      laser      Family History  Problem Relation Age of Onset  . Cancer Mother     gential  . Cancer Father     throat   . Heart disease    . Arthritis    . Lung disease    . Asthma    . Pneumonia Sister      History   Social History  . Marital Status: Widowed    Spouse Name: N/A  . Number of Children: 7  . Years of Education: college   Occupational History  . retired     Social History Main Topics  . Smoking status: Never  Smoker   . Smokeless tobacco: Never Used  . Alcohol Use: No  . Drug Use: Not on file  . Sexual Activity: Not on file   Other Topics Concern  . Not on file   Social History Narrative     BP 126/60 mmHg  Pulse 66  Ht 5\' 5"  (1.651 m)  Wt 153 lb (69.4 kg)  BMI 25.46 kg/m2  SpO2 96%  Physical Exam:  Well appearing 81 year old woman, NAD HEENT: Unremarkable Neck:  No JVD, no thyromegally Back:  No CVA tenderness Lungs:  Clear with no wheezes, rales, or rhonchi. HEART:  Regular rate rhythm, no murmurs, no rubs, no clicks Abd:  soft, positive bowel sounds, no organomegally, no rebound, no guarding Ext:  2 plus pulses, no edema, no cyanosis, no clubbing Skin:  No rashes no nodules Neuro:  CN II through XII intact, motor grossly intact  DEVICE  Normal device function.  See PaceArt for details.   Assess/Plan:

## 2015-02-09 ENCOUNTER — Emergency Department (HOSPITAL_COMMUNITY)
Admission: EM | Admit: 2015-02-09 | Discharge: 2015-02-09 | Disposition: A | Discharge status: Home / Self Care | Payer: Medicare Other | Attending: Emergency Medicine | Admitting: Emergency Medicine

## 2015-02-09 ENCOUNTER — Encounter (HOSPITAL_COMMUNITY): Payer: Self-pay

## 2015-02-09 ENCOUNTER — Emergency Department (HOSPITAL_COMMUNITY): Payer: Medicare Other

## 2015-02-09 ENCOUNTER — Telehealth: Payer: Self-pay | Admitting: *Deleted

## 2015-02-09 ENCOUNTER — Encounter: Payer: Self-pay | Admitting: Family Medicine

## 2015-02-09 ENCOUNTER — Ambulatory Visit (INDEPENDENT_AMBULATORY_CARE_PROVIDER_SITE_OTHER): Payer: Medicare Other | Admitting: Family Medicine

## 2015-02-09 VITALS — BP 138/64 | HR 73 | Temp 98.5°F | Resp 16 | Ht 66.0 in | Wt 153.0 lb

## 2015-02-09 DIAGNOSIS — M6281 Muscle weakness (generalized): Secondary | ICD-10-CM | POA: Diagnosis not present

## 2015-02-09 DIAGNOSIS — K219 Gastro-esophageal reflux disease without esophagitis: Secondary | ICD-10-CM | POA: Diagnosis not present

## 2015-02-09 DIAGNOSIS — R110 Nausea: Secondary | ICD-10-CM | POA: Diagnosis not present

## 2015-02-09 DIAGNOSIS — R059 Cough, unspecified: Secondary | ICD-10-CM

## 2015-02-09 DIAGNOSIS — Z7951 Long term (current) use of inhaled steroids: Secondary | ICD-10-CM | POA: Diagnosis not present

## 2015-02-09 DIAGNOSIS — I10 Essential (primary) hypertension: Secondary | ICD-10-CM | POA: Diagnosis not present

## 2015-02-09 DIAGNOSIS — Z7901 Long term (current) use of anticoagulants: Secondary | ICD-10-CM | POA: Insufficient documentation

## 2015-02-09 DIAGNOSIS — R1011 Right upper quadrant pain: Secondary | ICD-10-CM

## 2015-02-09 DIAGNOSIS — B029 Zoster without complications: Secondary | ICD-10-CM | POA: Diagnosis not present

## 2015-02-09 DIAGNOSIS — R05 Cough: Secondary | ICD-10-CM | POA: Diagnosis not present

## 2015-02-09 DIAGNOSIS — R21 Rash and other nonspecific skin eruption: Secondary | ICD-10-CM | POA: Diagnosis not present

## 2015-02-09 DIAGNOSIS — E039 Hypothyroidism, unspecified: Secondary | ICD-10-CM | POA: Diagnosis not present

## 2015-02-09 DIAGNOSIS — R531 Weakness: Secondary | ICD-10-CM

## 2015-02-09 DIAGNOSIS — I480 Paroxysmal atrial fibrillation: Secondary | ICD-10-CM | POA: Diagnosis not present

## 2015-02-09 DIAGNOSIS — E785 Hyperlipidemia, unspecified: Secondary | ICD-10-CM | POA: Insufficient documentation

## 2015-02-09 DIAGNOSIS — Z79899 Other long term (current) drug therapy: Secondary | ICD-10-CM | POA: Diagnosis not present

## 2015-02-09 DIAGNOSIS — Z8669 Personal history of other diseases of the nervous system and sense organs: Secondary | ICD-10-CM | POA: Diagnosis not present

## 2015-02-09 LAB — COMPREHENSIVE METABOLIC PANEL
ALBUMIN: 3.6 g/dL (ref 3.5–5.0)
ALT: 13 U/L — AB (ref 14–54)
AST: 18 U/L (ref 15–41)
Alkaline Phosphatase: 50 U/L (ref 38–126)
Anion gap: 7 (ref 5–15)
BILIRUBIN TOTAL: 1.3 mg/dL — AB (ref 0.3–1.2)
BUN: 15 mg/dL (ref 6–20)
CHLORIDE: 94 mmol/L — AB (ref 101–111)
CO2: 26 mmol/L (ref 22–32)
CREATININE: 0.8 mg/dL (ref 0.44–1.00)
Calcium: 8.9 mg/dL (ref 8.9–10.3)
GFR calc Af Amer: >60 mL/min (ref >60)
GFR calc non Af Amer: >60 mL/min (ref >60)
Glucose, Bld: 121 mg/dL — ABNORMAL HIGH (ref 65–99)
POTASSIUM: 3.8 mmol/L (ref 3.5–5.1)
Sodium: 127 mmol/L — ABNORMAL LOW (ref 135–145)
Total Protein: 7.2 g/dL (ref 6.5–8.1)

## 2015-02-09 LAB — CBC WITH DIFFERENTIAL/PLATELET
Basophils Absolute: 0 10*3/uL (ref 0.0–0.1)
Basophils Relative: 0 % (ref 0–1)
EOS ABS: 0 10*3/uL (ref 0.0–0.7)
Eosinophils Relative: 0 % (ref 0–5)
HCT: 33.3 % — ABNORMAL LOW (ref 36.0–46.0)
Hemoglobin: 11.2 g/dL — ABNORMAL LOW (ref 12.0–15.0)
LYMPHS ABS: 2 10*3/uL (ref 0.7–4.0)
Lymphocytes Relative: 19 % (ref 12–46)
MCH: 29.2 pg (ref 26.0–34.0)
MCHC: 33.6 g/dL (ref 30.0–36.0)
MCV: 86.7 fL (ref 78.0–100.0)
MONOS PCT: 14 % — AB (ref 3–12)
Monocytes Absolute: 1.4 10*3/uL — ABNORMAL HIGH (ref 0.1–1.0)
NEUTROS PCT: 67 % (ref 43–77)
Neutro Abs: 7 10*3/uL (ref 1.7–7.7)
Platelets: 237 10*3/uL (ref 150–400)
RBC: 3.84 MIL/uL — ABNORMAL LOW (ref 3.87–5.11)
RDW: 13.5 % (ref 11.5–15.5)
WBC: 10.4 10*3/uL (ref 4.0–10.5)

## 2015-02-09 LAB — URINALYSIS, ROUTINE W REFLEX MICROSCOPIC
Bilirubin Urine: NEGATIVE
Glucose, UA: NEGATIVE mg/dL
Ketones, ur: NEGATIVE mg/dL
LEUKOCYTES UA: NEGATIVE
Nitrite: NEGATIVE
Protein, ur: NEGATIVE mg/dL
SPECIFIC GRAVITY, URINE: 1.01 (ref 1.005–1.030)
UROBILINOGEN UA: 0.2 mg/dL (ref 0.0–1.0)
pH: 6.5 (ref 5.0–8.0)

## 2015-02-09 LAB — TSH: TSH: 0.166 u[IU]/mL — ABNORMAL LOW (ref 0.350–4.500)

## 2015-02-09 LAB — PROTIME-INR
INR: 1.77 — ABNORMAL HIGH (ref 0.00–1.49)
Prothrombin Time: 20.6 seconds — ABNORMAL HIGH (ref 11.6–15.2)

## 2015-02-09 LAB — TROPONIN I

## 2015-02-09 LAB — URINE MICROSCOPIC-ADD ON

## 2015-02-09 LAB — LIPASE, BLOOD: LIPASE: 28 U/L (ref 22–51)

## 2015-02-09 MED ORDER — ONDANSETRON 4 MG PO TBDP: 4.0000 mg | ORAL_TABLET | Freq: Three times a day (TID) | ORAL | Status: DC | PRN

## 2015-02-09 MED ORDER — SODIUM CHLORIDE 0.9 % IV SOLN: INTRAVENOUS | Status: DC

## 2015-02-09 MED ORDER — TETRACAINE HCL 0.5 % OP SOLN
2.0000 [drp] | Freq: Once | OPHTHALMIC | Status: DC
  Filled 2015-02-09: qty 2

## 2015-02-09 MED ORDER — POLYMYXIN B-TRIMETHOPRIM 10000-0.1 UNIT/ML-% OP SOLN: 2.0000 [drp] | Freq: Four times a day (QID) | OPHTHALMIC | Status: DC

## 2015-02-09 MED ORDER — VALACYCLOVIR HCL 1 G PO TABS
1000.0000 mg | ORAL_TABLET | Freq: Three times a day (TID) | ORAL | Status: DC
Start: 2015-02-09 — End: 2015-03-04

## 2015-02-09 MED ORDER — HYDROCODONE-ACETAMINOPHEN 5-325 MG PO TABS: 0.5000 | ORAL_TABLET | Freq: Four times a day (QID) | ORAL | Status: DC | PRN

## 2015-02-09 MED ORDER — ONDANSETRON HCL 4 MG/2ML IJ SOLN
4.0000 mg | Freq: Once | INTRAMUSCULAR | Status: AC
  Administered 2015-02-09: 4 mg via INTRAVENOUS
  Filled 2015-02-09: qty 2

## 2015-02-09 MED ORDER — SODIUM CHLORIDE 0.9 % IV BOLUS (SEPSIS)
1000.0000 mL | Freq: Once | INTRAVENOUS | Status: AC
  Administered 2015-02-09: 1000 mL via INTRAVENOUS

## 2015-02-09 MED ORDER — FLUORESCEIN SODIUM 1 MG OP STRP
1.0000 | ORAL_STRIP | Freq: Once | OPHTHALMIC | Status: DC
  Filled 2015-02-09: qty 1

## 2015-02-09 MED ORDER — VALACYCLOVIR HCL 500 MG PO TABS
1000.0000 mg | ORAL_TABLET | Freq: Once | ORAL | Status: AC
  Administered 2015-02-09: 1000 mg via ORAL
  Filled 2015-02-09: qty 2

## 2015-02-09 NOTE — ED Notes (Signed)
Family reports pt was put on zpack for cold symptoms Memorial weekend.  Daughter says pt never fully recovered.  Pt started c/o headache 3 or 4 days ago, rash on face today, r eye red yesterday.  Pt c/o generalized weakness, difficuly walking, and not eating.  Reports nausea.

## 2015-02-09 NOTE — ED Notes (Signed)
Family member stated "she cant see anything" so patient states "I can see you but not chart".  Advised Dr. Leonides Schanz

## 2015-02-09 NOTE — Patient Instructions (Addendum)
You need to go directly to the emergency department  Your acute illness is very concerning, I have spoken directly to the ED Doc about you, I believe that you have shingles  I know you will be better when you return to see me

## 2015-02-09 NOTE — ED Notes (Signed)
Patient was ambulated in room per doctor's request.  Patient was able to ambulate with assistance since she uses a cane.

## 2015-02-09 NOTE — Telephone Encounter (Signed)
Spoke with daughter who states that patient has had abdominal pain, fatigue, cough, congestion, and nausea x 2 days.  Will be worked in on schedule this afternoon.

## 2015-02-09 NOTE — Telephone Encounter (Signed)
[name] called for pt stating pt has been sick pt's stomach has been hurting, pt's eyes are swollen, and pt slept all day yesterday until she had to go to the heart doctor, [name] stated the heart doctor wouldn't giver her anything and pt needs to be seen today I made her aware I have something open for tomorrow at 2:15, [name] stated she wants pt seen today and she knows Dr. Moshe Cipro would want to see her today. Please advise [phone]

## 2015-02-09 NOTE — ED Provider Notes (Addendum)
TIME SEEN: 3:10 PM  CHIEF COMPLAINT: Rash to the right side of her face, generalized weakness  HPI: Pt is a 81 y.o. female with history of hypertension, hyperlipidemia, hypothyroidism, sick sinus syndrome status post pacemaker, atrial fibrillation on Coumadin who presents to the emergency department with complaints of headache that started 3-4 days ago, eye redness that started yesterday and a red rash to the right side of her face with one vesicular lesion that started today. Was seen by Dr. Moshe Cipro her PCP today and was sent to the emergency department for further evaluation. States she has felt weak, nauseous. Today has felt like she could not walk. She normally ambulates with a walker. She denies any chest pain or shortness of breath. Daughter states she has had cough. No vomiting or diarrhea. Patient denies that she has had any abdominal pain. No numbness, tingling or focal weakness. No headache currently.  ROS: See HPI Constitutional: no fever  Eyes: no drainage  ENT: no runny nose   Cardiovascular:  no chest pain  Resp: no SOB  GI: no vomiting GU: no dysuria Integumentary: no rash  Allergy: no hives  Musculoskeletal: no leg swelling  Neurological: no slurred speech ROS otherwise negative  PAST MEDICAL HISTORY/PAST SURGICAL HISTORY:  Past Medical History  Diagnosis Date  . Hypertension   . Hyperlipidemia   . Pancreatitis   . Gastroesophageal reflux disease   . Impaired glucose tolerance   . Hypothyroidism   . Macular degeneration   . Sleep apnea     Nocturnal oxygen therapy  . Arthritis   . Sick sinus syndrome 2011    Atrial fibrilation 10/2009; and dual chamber pacemaker in 4/11; normal EF  . Paroxysmal atrial fibrillation 2011  . Hearing impairment     MEDICATIONS:  Prior to Admission medications   Medication Sig Start Date End Date Taking? Authorizing Provider  Besifloxacin HCl (BESIVANCE) 0.6 % SUSP Apply 0.6 drops to eye.    Historical Provider, MD  [place] 125 MCG  tablet TAKE 1 TABLET BY MOUTH ONCE A DAY. 10/26/14   Arnoldo Lenis, MD  fluticasone (FLONASE) 50 MCG/ACT nasal spray USE 1 SPRAY IN EACH NOSTRIL DAILY. 12/23/13   Fayrene Helper, MD  hydrochlorothiazide (HYDRODIURIL) 25 MG tablet TAKE 1 TABLET BY MOUTH ONCE DAILY. 11/30/14   Fayrene Helper, MD  levothyroxine (SYNTHROID, LEVOTHROID) 100 MCG tablet TAKE 1 1/2 TABLETS BY MOUTH on THURS, FRI, SAT AND SUN. 1 TABLET ALL OTHER DAYS 12/17/14   Fayrene Helper, MD  lovastatin (MEVACOR) 40 MG tablet TAKE ONE TABLET BY MOUTH DAILY. 10/12/14   Fayrene Helper, MD  metoprolol (LOPRESSOR) 100 MG tablet TAKE 1 TABLET BY MOUTH TWICE A DAY. 11/30/14   Fayrene Helper, MD  multivitamin-lutein [facility]) CAPS Take 1 capsule by mouth daily.    Historical Provider, MD  omeprazole (PRILOSEC) 40 MG capsule TAKE (1) CAPSULE BY MOUTH ONCE DAILY FOR ACID REFLUX. 11/30/14   Fayrene Helper, MD  OXYGEN-HELIUM IN Inhale 2 L into the lungs at bedtime.    Historical Provider, MD  Potassium 99 MG TABS Take 99 mg by mouth every morning.     Historical Provider, MD  warfarin (COUMADIN) 5 MG tablet TAKE 1/2 TABLET BY MOUTH DAILY EXCEPT 1 TABLET ON SUNDAYS AND WEDNESDAYS OR AS DIRECTED. 10/26/14   Arnoldo Lenis, MD    ALLERGIES:  No Known Allergies  SOCIAL HISTORY:  History  Substance Use Topics  . Smoking status: Never Smoker   .  Smokeless tobacco: Never Used  . Alcohol Use: No    FAMILY HISTORY: Family History  Problem Relation Age of Onset  . Cancer Mother     gential  . Cancer Father     throat   . Heart disease    . Arthritis    . Lung disease    . Asthma    . Pneumonia Sister     EXAM: BP 154/80 mmHg  Pulse 60  Temp(Src) 98.2 F (36.8 C) (Oral)  Resp 18  Ht 5\' 5"  (1.651 m)  Wt 153 lb (69.4 kg)  BMI 25.46 kg/m2  SpO2 98% CONSTITUTIONAL: Alert and oriented and responds appropriately to questions. Well-appearing; well-nourished HEAD: Normocephalic, patient has erythematous  patches to the right side of her face and scalp with one vesicular lesion on the right forehead EYES: Right conjunctiva is injected with chemosis, pupils are equal reactive bilaterally, extraocular movements intact; patient has some yellow discharge from the right eye, no corneal abrasion or ulcers, no obvious dendritic lesions ENT: normal nose; no rhinorrhea; moist mucous membranes; pharynx without lesions noted NECK: Supple, no meningismus, no LAD  CARD: RRR; S1 and S2 appreciated; no murmurs, no clicks, no rubs, no gallops RESP: Normal chest excursion without splinting or tachypnea; breath sounds clear and equal bilaterally; no wheezes, no rhonchi, no rales, no hypoxia or respiratory distress, speaking full sentences ABD/GI: Normal bowel sounds; non-distended; soft, non-tender, no rebound, no guarding, no peritoneal signs; no tenderness at McBurney's point, negative Murphy sign. Patient has absolutely no tenderness to deep palpation of her abdomen BACK:  The back appears normal and is non-tender to palpation, there is no CVA tenderness EXT: Normal ROM in all joints; non-tender to palpation; no edema; normal capillary refill; no cyanosis, no calf tenderness or swelling    SKIN: Normal color for age and race; warm NEURO: Moves all extremities equally, sensation to light touch intact diffusely, cranial nerves II through XII intact, strength 5/5 in all 4 extremities PSYCH: The patient's mood and manner are appropriate. Grooming and personal hygiene are appropriate.  MEDICAL DECISION MAKING: Patient here with what appears to be herpes zoster to the right face and complaints of not feeling well, generalized weakness. Her daughter is very concerned that she could be dehydrated. Will obtain labs, urine, chest x-ray. We'll give acyclovir for her herpes zoster.  Patient's daughter is very concerned that she has abdominal pain. When you talk to patient she denies that she has ever had abdominal pain describes  this as feeling nauseous. Her abdominal exam is completely benign. Have explained to daughter at length that I do not feel at this time that imaging of her abdomen will be useful in the emergency department setting. We'll check lipase, LFTs, white blood cell count, creatinine and urine today.  ED PROGRESS: Patient's labs show sodium that is slightly low at 127. She has received IV fluids in the ED. Otherwise her labs are unremarkable. Creatinine is normal, lipase normal. LFTs unremarkable. Total bilirubin is 1.3 but normal ranges 1.2. Troponin negative. Her INR slightly subtherapeutic at 1.77. Chest x-ray is clear and shows no infiltrate. EKG shows no ischemic changes.  Patient reports that she has chronically poor right eyesight that is unchanged.  She has been able to ambulate in the ED with minimal assistance. Her repeat abdominal exam is benign still.  Urine shows hemoglobin but she was cathed for this urine. She did have many bacteria but has no urinary symptoms. Culture is pending. At this time  I do not feel she needs to be on antibiotics that we can wait for the culture to return. I feel she is safe to be discharged home and I do not feel that there is any reason she needs further emergent medical workup or admission. Discussed this with patient's daughter who seems to be very anxious. Patient states she is comfortable with this plan and would prefer to go home. I feel she is safe to be discharged home on Valtrex with close outpatient follow-up. Will also discharge with Polytrim drops for her right eye. Have advised her to avoid contact with immunocompromised people, children, elderly, pregnant women while having active shingles. She will contact her ophthalmologist Dr. Zigmund Daniel tomorrow. Will also discharge with pain medication to use as needed. Discussed return precautions. She verbalized understanding and is comfortable with this plan.     EKG Interpretation  Date/Time:  Tuesday February 09 2015  15:27:57 EDT Ventricular Rate:  60 PR Interval:  216 QRS Duration: 102 QT Interval:  384 QTC Calculation: 384 R Axis:   -32 Text Interpretation:  Sinus rhythm Borderline prolonged PR interval Left axis deviation Anteroseptal infarct, old Confirmed by Teyton Pattillo,  DO, Chassie Pennix ([id number]) on 02/09/2015 3:31:57 PM          [place], DO 02/09/15 [place], DO 02/09/15 [place], DO 02/09/15 [id number]

## 2015-02-09 NOTE — Discharge Instructions (Signed)
Your labs today were reassuring. Your sodium was slightly low at 127 but you have received IV fluids in the emergency department. Your chest x-ray was clear. Urine showed line with no other sign of infection and this is likely because we had to catheterize you to receive urine. Please follow-up with your primary care physician for further management. I also recommended she call your ophthalmologist tomorrow to schedule an appointment.   Shingles Shingles (herpes zoster) is an infection that is caused by the same virus that causes chickenpox (varicella). The infection causes a painful skin rash and fluid-filled blisters, which eventually break open, crust over, and heal. It may occur in any area of the body, but it usually affects only one side of the body or face. The pain of shingles usually lasts about 1 month. However, some people with shingles may develop long-term (chronic) pain in the affected area of the body. Shingles often occurs many years after the person had chickenpox. It is more common:  In people older than 50 years.  In people with weakened immune systems, such as those with HIV, AIDS, or cancer.  In people taking medicines that weaken the immune system, such as transplant medicines.  In people under great stress. CAUSES  Shingles is caused by the varicella zoster virus (VZV), which also causes chickenpox. After a person is infected with the virus, it can remain in the person's body for years in an inactive state (dormant). To cause shingles, the virus reactivates and breaks out as an infection in a nerve root. The virus can be spread from person to person (contagious) through contact with open blisters of the shingles rash. It will only spread to people who have not had chickenpox. When these people are exposed to the virus, they may develop chickenpox. They will not develop shingles. Once the blisters scab over, the person is no longer contagious and cannot spread the virus to  others. SIGNS AND SYMPTOMS  Shingles shows up in stages. The initial symptoms may be pain, itching, and tingling in an area of the skin. This pain is usually described as burning, stabbing, or throbbing.In a few days or weeks, a painful red rash will appear in the area where the pain, itching, and tingling were felt. The rash is usually on one side of the body in a band or belt-like pattern. Then, the rash usually turns into fluid-filled blisters. They will scab over and dry up in approximately 2-3 weeks. Flu-like symptoms may also occur with the initial symptoms, the rash, or the blisters. These may include:  Fever.  Chills.  Headache.  Upset stomach. DIAGNOSIS  Your health care provider will perform a skin exam to diagnose shingles. Skin scrapings or fluid samples may also be taken from the blisters. This sample will be examined under a microscope or sent to a lab for further testing. TREATMENT  There is no specific cure for shingles. Your health care provider will likely prescribe medicines to help you manage the pain, recover faster, and avoid long-term problems. This may include antiviral drugs, anti-inflammatory drugs, and pain medicines. HOME CARE INSTRUCTIONS   Take a cool bath or apply cool compresses to the area of the rash or blisters as directed. This may help with the pain and itching.   Take medicines only as directed by your health care provider.   Rest as directed by your health care provider.  Keep your rash and blisters clean with mild soap and cool water or as directed by your  health care provider.  Do not pick your blisters or scratch your rash. Apply an anti-itch cream or numbing creams to the affected area as directed by your health care provider.  Keep your shingles rash covered with a loose bandage (dressing).  Avoid skin contact with:  Babies.   Pregnant women.   Children with eczema.   Elderly people with transplants.   People with chronic  illnesses, such as leukemia or AIDS.   Wear loose-fitting clothing to help ease the pain of material rubbing against the rash.  Keep all follow-up visits as directed by your health care provider.If the area involved is on your face, you may receive a referral for a specialist, such as an eye doctor (ophthalmologist) or an ear, nose, and throat (ENT) doctor. Keeping all follow-up visits will help you avoid eye problems, chronic pain, or disability.  SEEK IMMEDIATE MEDICAL CARE IF:   You have facial pain, pain around the eye area, or loss of feeling on one side of your face.  You have ear pain or ringing in your ear.  You have loss of taste.  Your pain is not relieved with prescribed medicines.   Your redness or swelling spreads.   You have more pain and swelling.  Your condition is worsening or has changed.   You have a fever. MAKE SURE YOU:  Understand these instructions.  Will watch your condition.  Will get help right away if you are not doing well or get worse. Document Released: 08/14/2005 Document Revised: 12/29/2013 Document Reviewed: 03/28/2012 [facility] Patient Information 2015 [place], [place]. This information is not intended to replace advice given to you by your health care provider. Make sure you discuss any questions you have with your health care provider.  Weakness Weakness is a lack of strength. It may be felt all over the body (generalized) or in one specific part of the body (focal). Some causes of weakness can be serious. You may need further medical evaluation, especially if you are elderly or you have a history of immunosuppression (such as chemotherapy or HIV), kidney disease, heart disease, or diabetes. CAUSES  Weakness can be caused by many different things, including:  Infection.  Physical exhaustion.  Internal bleeding or other blood loss that results in a lack of red blood cells (anemia).  Dehydration. This cause is more common in elderly  people.  Side effects or electrolyte abnormalities from medicines, such as pain medicines or sedatives.  Emotional distress, anxiety, or depression.  Circulation problems, especially severe peripheral arterial disease.  Heart disease, such as rapid atrial fibrillation, bradycardia, or heart failure.  Nervous system disorders, such as Guillain-Barr syndrome, multiple sclerosis, or stroke. DIAGNOSIS  To find the cause of your weakness, your caregiver will take your history and perform a physical exam. Lab tests or X-rays may also be ordered, if needed. TREATMENT  Treatment of weakness depends on the cause of your symptoms and can vary greatly. HOME CARE INSTRUCTIONS   Rest as needed.  Eat a well-balanced diet.  Try to get some exercise every day.  Only take over-the-counter or prescription medicines as directed by your caregiver. SEEK MEDICAL CARE IF:   Your weakness seems to be getting worse or spreads to other parts of your body.  You develop new aches or pains. SEEK IMMEDIATE MEDICAL CARE IF:   You cannot perform your normal daily activities, such as getting dressed and feeding yourself.  You cannot walk up and down stairs, or you feel exhausted when you do  so.  You have shortness of breath or chest pain.  You have difficulty moving parts of your body.  You have weakness in only one area of the body or on only one side of the body.  You have a fever.  You have trouble speaking or swallowing.  You cannot control your bladder or bowel movements.  You have black or bloody vomit or stools. MAKE SURE YOU:  Understand these instructions.  Will watch your condition.  Will get help right away if you are not doing well or get worse. Document Released: 08/14/2005 Document Revised: 02/13/2012 Document Reviewed: 10/13/2011 [facility] Patient Information 2015 [place], [place]. This information is not intended to replace advice given to you by your health care provider. Make  sure you discuss any questions you have with your health care provider.

## 2015-02-10 ENCOUNTER — Encounter: Payer: Self-pay | Admitting: Family Medicine

## 2015-02-10 ENCOUNTER — Ambulatory Visit (INDEPENDENT_AMBULATORY_CARE_PROVIDER_SITE_OTHER): Payer: Medicare Other | Admitting: Family Medicine

## 2015-02-10 VITALS — BP 126/72 | HR 72 | Resp 18 | Ht 66.0 in

## 2015-02-10 DIAGNOSIS — R1011 Right upper quadrant pain: Secondary | ICD-10-CM | POA: Insufficient documentation

## 2015-02-10 DIAGNOSIS — E785 Hyperlipidemia, unspecified: Secondary | ICD-10-CM

## 2015-02-10 DIAGNOSIS — E038 Other specified hypothyroidism: Secondary | ICD-10-CM

## 2015-02-10 DIAGNOSIS — R110 Nausea: Secondary | ICD-10-CM | POA: Diagnosis not present

## 2015-02-10 DIAGNOSIS — I10 Essential (primary) hypertension: Secondary | ICD-10-CM

## 2015-02-10 DIAGNOSIS — I495 Sick sinus syndrome: Secondary | ICD-10-CM

## 2015-02-10 DIAGNOSIS — B029 Zoster without complications: Secondary | ICD-10-CM | POA: Diagnosis not present

## 2015-02-10 LAB — URINE CULTURE: Culture: NO GROWTH

## 2015-02-10 MED ORDER — LEVOTHYROXINE SODIUM 100 MCG PO TABS: 100.0000 ug | ORAL_TABLET | Freq: Every day | ORAL | Status: DC

## 2015-02-10 MED ORDER — ONDANSETRON HCL 4 MG/2ML IJ SOLN
4.0000 mg | Freq: Once | INTRAMUSCULAR | Status: AC
  Administered 2015-02-10: 4 mg via INTRAMUSCULAR

## 2015-02-10 MED ORDER — GABAPENTIN 100 MG PO CAPS: ORAL_CAPSULE | ORAL | Status: DC

## 2015-02-10 NOTE — Patient Instructions (Addendum)
F/u as before  Reduce thyroid med to ONE daily,   Chem7  Today   Zofran in  Office today for nausea  You are referred for [place] of gallbladder  New for pain is gabapentin 100 mg   New for management of pain due to nerve irritation is gabapentin 100mg .Take instead of hydrocodone  Start one capsule at bedtime, after 3 to 5 days increase , if needed, to two capsules at bedtime, and after an additional 5 days to 3 capsules at bedtime if needed , for pain control.  Please note, the script is written as one three times daily, DO NOT take like this, instead take only at bedtime as above.

## 2015-02-11 ENCOUNTER — Other Ambulatory Visit: Payer: Self-pay

## 2015-02-11 ENCOUNTER — Ambulatory Visit (HOSPITAL_COMMUNITY)
Admission: RE | Admit: 2015-02-11 | Discharge: 2015-02-11 | Disposition: A | Discharge status: Home / Self Care | Payer: Medicare Other | Source: Ambulatory Visit | Attending: Family Medicine | Admitting: Family Medicine

## 2015-02-11 DIAGNOSIS — R110 Nausea: Secondary | ICD-10-CM | POA: Insufficient documentation

## 2015-02-11 DIAGNOSIS — R1011 Right upper quadrant pain: Secondary | ICD-10-CM | POA: Diagnosis not present

## 2015-02-11 LAB — BASIC METABOLIC PANEL
BUN: 13 mg/dL (ref 6–23)
CHLORIDE: 93 meq/L — AB (ref 96–112)
CO2: 24 meq/L (ref 19–32)
Calcium: 9.2 mg/dL (ref 8.4–10.5)
Creat: 0.77 mg/dL (ref 0.50–1.10)
Glucose, Bld: 113 mg/dL — ABNORMAL HIGH (ref 70–99)
Potassium: 4.3 mEq/L (ref 3.5–5.3)
Sodium: 129 mEq/L — ABNORMAL LOW (ref 135–145)

## 2015-02-15 ENCOUNTER — Encounter: Payer: Self-pay | Admitting: Family Medicine

## 2015-02-15 ENCOUNTER — Ambulatory Visit (INDEPENDENT_AMBULATORY_CARE_PROVIDER_SITE_OTHER): Payer: Medicare Other | Admitting: Family Medicine

## 2015-02-15 ENCOUNTER — Telehealth: Payer: Self-pay

## 2015-02-15 VITALS — BP 134/62 | HR 80 | Resp 16 | Ht 66.0 in | Wt 157.0 lb

## 2015-02-15 DIAGNOSIS — B029 Zoster without complications: Secondary | ICD-10-CM | POA: Diagnosis not present

## 2015-02-15 DIAGNOSIS — I10 Essential (primary) hypertension: Secondary | ICD-10-CM

## 2015-02-15 DIAGNOSIS — J020 Streptococcal pharyngitis: Secondary | ICD-10-CM | POA: Diagnosis not present

## 2015-02-15 LAB — POCT RAPID STREP A (OFFICE): Rapid Strep A Screen: POSITIVE — AB

## 2015-02-15 MED ORDER — PENICILLIN V POTASSIUM 500 MG PO TABS: 500.0000 mg | ORAL_TABLET | Freq: Three times a day (TID) | ORAL | Status: DC

## 2015-02-15 NOTE — Assessment & Plan Note (Signed)
Acute sore throat , positive rapid strep test, 10 day course of penicillin prescribed

## 2015-02-15 NOTE — Assessment & Plan Note (Signed)
Controlled, no change in medication  

## 2015-02-15 NOTE — Progress Notes (Signed)
   [name]     MRN: [id number]      DOB: 05-May-1926   HPI [name] is here  With a 2 day h/o right ear pain and sore throat The pain and rash of shingles is markedly improved, she is getting stronger and is taking gabapentin only ONE daily.  Has rescheduled eye exam f/u to this week, as the shingles is periorbitalThe PT denies any adverse reactions to current medications since the last visit.    ROS Denies recent fever or chills. Denies chest congestion, productive cough or wheezing. Denies chest pains, palpitations and leg swelling Denies abdominal pain, nausea, vomiting,diarrhea or constipation.   Denies dysuria, frequency, hesitancy or incontinence. Denies increased /uncontrolled  joint pain, swelling and limitation in mobility. Denies headaches, seizures, numbness, or tingling. Denies depression, anxiety or insomnia. [name]   PE  BP 134/62 mmHg  Pulse 80  Resp 16  Ht 5\' 6"  (1.676 m)  Wt 157 lb (71.215 kg)  BMI 25.35 kg/m2  SpO2 98%  Patient alert and oriented and in no cardiopulmonary distress.Much improved since last visit  HEENT: No facial asymmetry, EOMI,   oropharynx  Mild erythema, no visible exudate, rapid strep is positive and moist.  Neck supple no JVD, no mass.TM clear bilaterally, external auditory canal normal  Chest: Clear to auscultation bilaterally.  CVS: S1, S2 no murmurs, no S3.Regular rate.  ABD: Soft non tender.   Ext: No edema  MS: Adequate though reduced  ROM spine, shoulders, hips and knees.  Skin: Intact, no ulcerations or rash noted.  Psych: Good eye contact, normal affect. Memory intact not anxious or depressed appearing.  CNS: CN 2-12 intact, power,  normal throughout.no focal deficits noted.   Assessment & Plan   Strep pharyngitis Acute sore throat , positive rapid strep test, 10 day course of penicillin prescribed  Herpes zoster Clinically improving, pain adequately managed on one neurontin, she is to d/c when not needed, she  is to complete antiviral course  Essential hypertension Controlled, no change in medication

## 2015-02-15 NOTE — Patient Instructions (Signed)
F/u as before  Your shingles is improving and so are you  Oenicillin prescribed for strep throat

## 2015-02-15 NOTE — Telephone Encounter (Signed)
Work in today please

## 2015-02-15 NOTE — Assessment & Plan Note (Signed)
Clinically improving, pain adequately managed on one neurontin, she is to d/c when not needed, she is to complete antiviral course

## 2015-02-15 NOTE — Telephone Encounter (Signed)
Patient added to am schedule.

## 2015-02-17 ENCOUNTER — Other Ambulatory Visit (HOSPITAL_COMMUNITY)
Admission: RE | Admit: 2015-02-17 | Discharge: 2015-02-17 | Disposition: A | Discharge status: Home / Self Care | Payer: Medicare Other | Source: Ambulatory Visit | Attending: Family Medicine | Admitting: Family Medicine

## 2015-02-17 DIAGNOSIS — I10 Essential (primary) hypertension: Secondary | ICD-10-CM | POA: Insufficient documentation

## 2015-02-17 LAB — BASIC METABOLIC PANEL
Anion gap: 10 (ref 5–15)
BUN: 16 mg/dL (ref 6–20)
CHLORIDE: 98 mmol/L — AB (ref 101–111)
CO2: 29 mmol/L (ref 22–32)
CREATININE: 0.94 mg/dL (ref 0.44–1.00)
Calcium: 9.7 mg/dL (ref 8.9–10.3)
GFR calc Af Amer: >60 mL/min (ref >60)
GFR calc non Af Amer: 53 mL/min — ABNORMAL LOW (ref >60)
Glucose, Bld: 126 mg/dL — ABNORMAL HIGH (ref 65–99)
Potassium: 3.9 mmol/L (ref 3.5–5.1)
Sodium: 137 mmol/L (ref 135–145)

## 2015-02-22 ENCOUNTER — Encounter (INDEPENDENT_AMBULATORY_CARE_PROVIDER_SITE_OTHER): Payer: Medicare Other | Admitting: Ophthalmology

## 2015-02-23 DIAGNOSIS — H02032 Senile entropion of right lower eyelid: Secondary | ICD-10-CM | POA: Diagnosis not present

## 2015-02-24 ENCOUNTER — Ambulatory Visit (INDEPENDENT_AMBULATORY_CARE_PROVIDER_SITE_OTHER): Payer: Medicare Other | Admitting: *Deleted

## 2015-02-24 ENCOUNTER — Other Ambulatory Visit: Payer: Self-pay | Admitting: Cardiology

## 2015-02-24 ENCOUNTER — Other Ambulatory Visit: Payer: Self-pay | Admitting: Family Medicine

## 2015-02-24 DIAGNOSIS — I4891 Unspecified atrial fibrillation: Secondary | ICD-10-CM | POA: Diagnosis not present

## 2015-02-24 DIAGNOSIS — Z7901 Long term (current) use of anticoagulants: Secondary | ICD-10-CM

## 2015-02-24 DIAGNOSIS — I480 Paroxysmal atrial fibrillation: Secondary | ICD-10-CM

## 2015-02-24 LAB — POCT INR: INR: 3

## 2015-02-25 ENCOUNTER — Encounter (INDEPENDENT_AMBULATORY_CARE_PROVIDER_SITE_OTHER): Payer: Medicare Other | Admitting: Ophthalmology

## 2015-03-02 ENCOUNTER — Encounter: Payer: Self-pay | Admitting: Internal Medicine

## 2015-03-03 DIAGNOSIS — R0902 Hypoxemia: Secondary | ICD-10-CM | POA: Diagnosis not present

## 2015-03-04 ENCOUNTER — Encounter: Payer: Self-pay | Admitting: Family Medicine

## 2015-03-04 ENCOUNTER — Ambulatory Visit: Payer: Medicare Other | Admitting: Family Medicine

## 2015-03-04 VITALS — BP 128/66 | HR 64 | Resp 18 | Ht 66.0 in | Wt 158.0 lb

## 2015-03-04 DIAGNOSIS — B029 Zoster without complications: Secondary | ICD-10-CM | POA: Diagnosis not present

## 2015-03-04 DIAGNOSIS — J020 Streptococcal pharyngitis: Secondary | ICD-10-CM

## 2015-03-04 DIAGNOSIS — E038 Other specified hypothyroidism: Secondary | ICD-10-CM

## 2015-03-04 DIAGNOSIS — I10 Essential (primary) hypertension: Secondary | ICD-10-CM | POA: Diagnosis not present

## 2015-03-04 DIAGNOSIS — Z7409 Other reduced mobility: Secondary | ICD-10-CM | POA: Diagnosis not present

## 2015-03-04 NOTE — Patient Instructions (Signed)
Physical in August as before, call if you need me sooner  Shingles has recovered   Dye hair in about 4 weeks  Shower and body spray every day!  Thanks for choosing [facility], we consider it a privelige to serve you.

## 2015-03-05 ENCOUNTER — Encounter (INDEPENDENT_AMBULATORY_CARE_PROVIDER_SITE_OTHER): Payer: Medicare Other | Admitting: Ophthalmology

## 2015-03-05 NOTE — Assessment & Plan Note (Signed)
RUQ ultrasound to further evaluate

## 2015-03-05 NOTE — Assessment & Plan Note (Signed)
Rate controlled with pacemaker, and pt is asymptomatic

## 2015-03-05 NOTE — Assessment & Plan Note (Signed)
Hyperlipidemia:Low fat diet discussed and encouraged.   Lipid Panel  Lab Results  Component Value Date   CHOL 154 12/14/2014   HDL 38* 12/14/2014   LDLCALC 61 12/14/2014   TRIG 277* 12/14/2014   CHOLHDL 4.1 12/14/2014   Encouraged to reduce fried and fatty foods

## 2015-03-05 NOTE — Assessment & Plan Note (Signed)
Controlled, no change in medication  

## 2015-03-05 NOTE — Assessment & Plan Note (Addendum)
zofran in office, short course of med sent in for as needed use. Some nausea likely due to hydrocodone prescribed in ED for pain, will change to gabapentin Chem 7 today  RUQ ultrasound to r/o gallstones

## 2015-03-05 NOTE — Progress Notes (Signed)
   Subjective:    Patient ID: [name], female    DOB: [birth year], 81 y.o.   MRN: [id number]  HPI Pt in today having been in the Ed the day before with an established diagnosis made of shingles. Daughter is concerned that her mother remains weak, and has poor appetite , also experiencing a lot of nausea and bloating  Thyroid lab work is reviewed and med adjustment made   Review of Systems See HPI C/o  recent fever , chills, fatigue and poor appetitie Denies sinus pressure, nasal congestion, ear pain or sore throat. Denies chest congestion, productive cough or wheezing. Denies chest pains, palpitations and leg swelling .   Denies dysuria, frequency, hesitancy or incontinence. Chronic  joint pain, swelling and limitation in mobility. Denies headaches, seizures, numbness, or tingling. Denies depression, anxiety or insomnia.         Objective:   Physical Exam  BP 126/72 mmHg  Pulse 72  Resp 18  Ht 5\' 6"  (1.676 m)  SpO2 95% Patient alert and oriented and in no cardiopulmonary distress.Ill appearing  HEENT: No facial asymmetry, EOMI,   oropharynx pink and moist.  Neck decreased ROM, no JVD, no mass.  Chest: Clear to auscultation bilaterally.  CVS: S1, S2 no murmurs, no S3.Regular rate.  ABD: Soft non tender.   Ext: No edema  MS: Adequate ROM spine, shoulders, hips and knees.  Skin: right peri orbital rash extending to scalp      Assessment & Plan:  Nausea without vomiting zofran in office, short course of med sent in for as needed use. Some nausea likely due to hydrocodone prescribed in ED for pain, will change to gabapentin Chem 7 today  RUQ ultrasound to r/o gallstones  Abdominal pain, right upper quadrant RUQ ultrasound to further evaluate  Herpes zoster Pt to take acyclovir as prescribed, she will see ophthalmology this week to determine if there is occular involvement. Rash is periorbital and  Up to right scalp. Gabapentin in titrated doses as needed/  tolerated for pain , in place of hydrocodone  Hypothyroidism Over corrected, dose reduction of synthroid  Essential hypertension Controlled, no change in medication   Sick sinus syndrome Rate controlled with pacemaker, and pt is asymptomatic  Hyperlipidemia Hyperlipidemia:Low fat diet discussed and encouraged.   Lipid Panel  Lab Results  Component Value Date   CHOL 154 12/14/2014   HDL 38* 12/14/2014   LDLCALC 61 12/14/2014   TRIG 277* 12/14/2014   CHOLHDL 4.1 12/14/2014   Encouraged to reduce fried and fatty foods

## 2015-03-05 NOTE — Assessment & Plan Note (Signed)
Over corrected, dose reduction of synthroid

## 2015-03-05 NOTE — Progress Notes (Signed)
   Subjective:    Patient ID: [name], female    DOB: 27-Jun-1926, 81 y.o.   MRN: [id number]  HPI Pt in fully recovered from her recent shingles outbreak affecting right periorbital area The ophthalmologist treated her for bacterial superinfection of the eye, she did not have shingles affecting her eye. She is back to her usual self, rash is totally cleared up, still using gabapentin now twice daily for pain management, no adverse effects noted Plans to start exercise at the [facility] next week Needs to commit to daily bath, will request shower chir to facilitate this   Review of Systems See HPI Denies recent fever or chills. Denies sinus pressure, nasal congestion, ear pain or sore throat. Denies chest congestion, productive cough or wheezing. Denies chest pains, palpitations and leg swelling Denies abdominal pain, nausea, vomiting,diarrhea or constipation.   Denies dysuria, frequency, hesitancy or incontinence. Chronic  joint pain, swelling and limitation in mobility. Denies headaches, seizures, numbness, or tingling. Denies depression, anxiety or insomnia. Denies skin break down or rash.        Objective:   Physical Exam BP 128/66 mmHg  Pulse 64  Resp 18  Ht 5\' 6"  (1.676 m)  Wt 158 lb (71.668 kg)  BMI 25.51 kg/m2  SpO2 96% Patient alert and oriented and in no cardiopulmonary distress.  HEENT: No facial asymmetry, EOMI,   oropharynx pink and moist.  Neck adequate ROM no JVD, no mass.  Chest: Clear to auscultation bilaterally.  CVS: S1, S2 no murmurs, no S3.Regular rate.  ABD: Soft non tender.   Ext: No edema  MS: Decreased  ROM spine, shoulders, hips and knees.  Skin: Intact, no ulcerations or rash noted.  Psych: Good eye contact, normal affect. Memory intact not anxious or depressed appearing.  CNS: CN 2-12 intact, power,  normal throughout.no focal deficits noted.        Assessment & Plan:  Strep pharyngitis Treated and asymptomatic  Herpes  zoster Treated fully, still using gabapentin twice daily, rash resolved  Essential hypertension Controlled, no change in medication   Hypothyroidism Controlled, no change in medication   Impaired mobility and endurance fatiguie with physical activity, will benefit from equipment in bath, orders written

## 2015-03-05 NOTE — Assessment & Plan Note (Signed)
Pt to take acyclovir as prescribed, she will see ophthalmology this week to determine if there is occular involvement. Rash is periorbital and  Up to right scalp. Gabapentin in titrated doses as needed/ tolerated for pain , in place of hydrocodone

## 2015-03-07 NOTE — Assessment & Plan Note (Signed)
Controlled, no change in medication  

## 2015-03-07 NOTE — Progress Notes (Signed)
   Subjective:    Patient ID: [name], female    DOB: 04/06/1926, 81 y.o.   MRN: [id number]  HPI 3 day h/o acute onset weakness, poor appetite and fever and chills,to weak to walk, rah on right side of face extending up to scalp, and around eye. No one else is sick at home   Review of Systems See HPI Denies sinus pressure, nasal congestion, ear pain or sore throat. Denies chest congestion, productive cough or wheezing. Denies chest pains, palpitations and leg swelling C/o RUQ pain and nausea, no vomit or change in BM Denies dysuria, frequency, hesitancy or incontinence. Worsened  joint pain, swelling and limitation in mobility. C/o right sided headache and burning pain around right eye        Objective:   Physical Exam  BP 138/64 mmHg  Pulse 73  Temp(Src) 98.5 F (36.9 C) (Oral)  Resp 16  Ht 5\' 6"  (1.676 m)  Wt 153 lb (69.4 kg)  BMI 24.71 kg/m2  SpO2 98% Patient : ill appearing, right periorbital rash extending to maxilla and to scalp  HEENT: No facial asymmetry, EOMI,   oropharynx pink and moist.  Neck decreased ROM, no JVD, no mass.Right conjunctiva erythematous  Chest: Clear to auscultation bilaterally.  CVS: S1, S2 no murmurs, no S3.Regular rate.  ABD: Soft , RUQ tenderness superficially, no guarding or rebound Ext: No edema  MS: Decreased  ROM spine, shoulders, hips and knees.         Assessment & Plan:  Herpes zoster Acute outbreak on right face and periorbital area , highly suspicious for acute zoster. Due to severe debility reported , will send to ED for eval  Essential hypertension Controlled, no change in medication   RUQ abdominal pain Acute onset, with tenderness in area, [place] of area and hepatic panel sep in light of poor appetite, will be ordered if not dose in ED

## 2015-03-07 NOTE — Assessment & Plan Note (Signed)
Acute outbreak on right face and periorbital area , highly suspicious for acute zoster. Due to severe debility reported , will send to ED for eval

## 2015-03-07 NOTE — Assessment & Plan Note (Signed)
Acute onset, with tenderness in area, [place] of area and hepatic panel sep in light of poor appetite, will be ordered if not dose in ED

## 2015-03-08 ENCOUNTER — Encounter (INDEPENDENT_AMBULATORY_CARE_PROVIDER_SITE_OTHER): Payer: Medicare Other | Admitting: Ophthalmology

## 2015-03-08 DIAGNOSIS — H43813 Vitreous degeneration, bilateral: Secondary | ICD-10-CM | POA: Diagnosis not present

## 2015-03-08 DIAGNOSIS — I10 Essential (primary) hypertension: Secondary | ICD-10-CM | POA: Diagnosis not present

## 2015-03-08 DIAGNOSIS — H3532 Exudative age-related macular degeneration: Secondary | ICD-10-CM

## 2015-03-08 DIAGNOSIS — H35033 Hypertensive retinopathy, bilateral: Secondary | ICD-10-CM

## 2015-03-08 DIAGNOSIS — H3531 Nonexudative age-related macular degeneration: Secondary | ICD-10-CM | POA: Diagnosis not present

## 2015-03-08 NOTE — Assessment & Plan Note (Signed)
fatiguie with physical activity, will benefit from equipment in bath, orders written

## 2015-03-08 NOTE — Assessment & Plan Note (Signed)
Treated fully, still using gabapentin twice daily, rash resolved

## 2015-03-08 NOTE — Assessment & Plan Note (Signed)
Controlled, no change in medication  

## 2015-03-08 NOTE — Assessment & Plan Note (Signed)
Treated and asymptomatic

## 2015-03-19 ENCOUNTER — Other Ambulatory Visit: Payer: Self-pay | Admitting: Family Medicine

## 2015-03-24 ENCOUNTER — Ambulatory Visit (INDEPENDENT_AMBULATORY_CARE_PROVIDER_SITE_OTHER): Payer: Medicare Other | Admitting: *Deleted

## 2015-03-24 DIAGNOSIS — I4891 Unspecified atrial fibrillation: Secondary | ICD-10-CM | POA: Diagnosis not present

## 2015-03-24 DIAGNOSIS — Z7901 Long term (current) use of anticoagulants: Secondary | ICD-10-CM

## 2015-03-24 DIAGNOSIS — I480 Paroxysmal atrial fibrillation: Secondary | ICD-10-CM

## 2015-03-24 LAB — POCT INR: INR: 2.4

## 2015-03-25 ENCOUNTER — Other Ambulatory Visit: Payer: Self-pay | Admitting: Family Medicine

## 2015-04-03 DIAGNOSIS — R0902 Hypoxemia: Secondary | ICD-10-CM | POA: Diagnosis not present

## 2015-04-22 ENCOUNTER — Encounter: Payer: Self-pay | Admitting: Family Medicine

## 2015-04-22 ENCOUNTER — Ambulatory Visit (INDEPENDENT_AMBULATORY_CARE_PROVIDER_SITE_OTHER): Payer: Medicare Other | Admitting: Family Medicine

## 2015-04-22 VITALS — BP 144/64 | HR 79 | Resp 16 | Ht 66.0 in | Wt 157.0 lb

## 2015-04-22 DIAGNOSIS — Z1211 Encounter for screening for malignant neoplasm of colon: Secondary | ICD-10-CM

## 2015-04-22 DIAGNOSIS — Z0000 Encounter for general adult medical examination without abnormal findings: Secondary | ICD-10-CM | POA: Insufficient documentation

## 2015-04-22 DIAGNOSIS — S80211A Abrasion, right knee, initial encounter: Secondary | ICD-10-CM | POA: Insufficient documentation

## 2015-04-22 DIAGNOSIS — E785 Hyperlipidemia, unspecified: Secondary | ICD-10-CM

## 2015-04-22 DIAGNOSIS — Z23 Encounter for immunization: Secondary | ICD-10-CM | POA: Diagnosis not present

## 2015-04-22 DIAGNOSIS — E038 Other specified hypothyroidism: Secondary | ICD-10-CM

## 2015-04-22 DIAGNOSIS — I10 Essential (primary) hypertension: Secondary | ICD-10-CM

## 2015-04-22 HISTORY — DX: Encounter for general adult medical examination without abnormal findings: Z00.00

## 2015-04-22 NOTE — Assessment & Plan Note (Addendum)
TdAP indicated and administered

## 2015-04-22 NOTE — Assessment & Plan Note (Signed)

## 2015-04-22 NOTE — Patient Instructions (Addendum)
F/u in Novemebr, call call if you need me before  TdAP today due to abrasion on right knee  Come in September for flu vaccine  Fasting lipid, cmp and eGFR, CBc and TSH  For Novemebr visit, get in [employer] for choosing [facility], we consider it a privelige to serve you.  Fall Prevention and Home Safety Falls cause injuries and can affect all age groups. It is possible to prevent falls.  HOW TO PREVENT FALLS  Wear shoes with rubber soles that do not have an opening for your toes.  Keep the inside and outside of your house well lit.  Use night lights throughout your home.  Remove clutter from floors.  Clean up floor spills.  Remove throw rugs or fasten them to the floor with carpet tape.  Do not place electrical cords across pathways.  Put grab bars by your tub, shower, and toilet. Do not use towel bars as grab bars.  Put handrails on both sides of the stairway. Fix loose handrails.  Do not climb on stools or stepladders, if possible.  Do not wax your floors.  Repair uneven or unsafe sidewalks, walkways, or stairs.  Keep items you use a lot within reach.  Be aware of pets.  Keep emergency numbers next to the telephone.  Put smoke detectors in your home and near bedrooms. Ask your doctor what other things you can do to prevent falls. Document Released: 06/10/2009 Document Revised: 02/13/2012 Document Reviewed: 11/14/2011 [facility] Patient Information 2015 [place], [place]. This information is not intended to replace advice given to you by your health care provider. Make sure you discuss any questions you have with your health care provider.

## 2015-04-24 ENCOUNTER — Encounter: Payer: Self-pay | Admitting: Family Medicine

## 2015-04-24 DIAGNOSIS — Z09 Encounter for follow-up examination after completed treatment for conditions other than malignant neoplasm: Secondary | ICD-10-CM | POA: Insufficient documentation

## 2015-04-24 DIAGNOSIS — Z1211 Encounter for screening for malignant neoplasm of colon: Secondary | ICD-10-CM

## 2015-04-24 NOTE — Assessment & Plan Note (Signed)
Heme negative stool.No rectal mass.

## 2015-04-24 NOTE — Progress Notes (Signed)
   Subjective:    Patient ID: [name], female    DOB: [birth year], 81 y.o.   MRN: [id number]  HPI Patient is in for annual physical exam.  Recent labs, are reviewed. Immunization is reviewed , and  updated if needed.    Review of Systems See HPI     Objective:   Physical Exam BP 144/64 mmHg  Pulse 79  Resp 16  Ht 5\' 6"  (1.676 m)  Wt 157 lb (71.215 kg)  BMI 25.35 kg/m2  SpO2 98%   Pleasant well nourished female, alert and oriented x 3, in no cardio-pulmonary distress. Afebrile. HEENT No facial trauma or asymetry. Sinuses non tender.  Extra occullar muscles intact, External ears normal, tympanic membranes clear. Oropharynx moist, no exudate, poor dentition. Neck: decreased ROM, no adenopathy,JVD or thyromegaly.No bruits.  Chest: Clear to ascultation bilaterally.No crackles or wheezes. Non tender to palpation  Breast: No asymetry,no masses or lumps. No tenderness. No nipple discharge or inversion. No axillary or supraclavicular adenopathy  Cardiovascular system; Heart sounds normal,  S1 and  S2 ,no S3.   murmur, or thrill. Apical beat not displaced. Pacemaker present in left chest Peripheral pulses normal.  Abdomen: Soft, non tender, no organomegaly or masses. No bruits. Bowel sounds normal. No guarding, tenderness or rebound.  Rectal:  Normal sphincter tone. No mass.No rectal masses.  Guaiac negative stool.  GU: External genitalia normal female genitalia , scant female distribution of hair. No lesions. Urethral meatus normal in size, no  Prolapse, no lesions visibly  Present. Bladder non tender. Vagina atrophic , with no visible lesions , discharge present . Adequate pelvic support no  cystocele or rectocele noted  Uterus absent  no adnexal masses, no adnexal tenderness.   Musculoskeletal exam: decreased  ROM of spine, hips , shoulders and knees.  deformity ,swelling and  crepitus noted. No muscle wasting or atrophy.   Neurologic: Cranial  nerves 2 to 12 intact. Power, tone ,sensation and reflexes normal throughout. Abnormal gait. No tremor.  Skin: Abrasion to right knee noted Pigmentation normal throughout  Psych; Normal mood and affect. Judgement and concentration normal        Assessment & Plan:  Annual physical exam Annual exam as documented. Counseling done  re healthy lifestyle involving commitment to 150 minutes exercise per week, heart healthy diet, and attaining healthy weight.The importance of adequate sleep also discussed. Regular seat belt use and home safety, is also discussed. Changes in health habits are decided on by the patient with goals and time frames  set for achieving them. Immunization and cancer screening needs are specifically addressed at this visit.   Abrasion of right knee TdAP indicated and administered  Special screening for malignant neoplasms, colon Heme negative stool.No rectal mass.

## 2015-04-26 LAB — POC HEMOCCULT BLD/STL (OFFICE/1-CARD/DIAGNOSTIC): Fecal Occult Blood, POC: NEGATIVE

## 2015-04-26 NOTE — Addendum Note (Signed)
Addended by: Eual Fines on: 04/26/2015 09:02 AM   Modules accepted: Orders

## 2015-05-04 DIAGNOSIS — R0902 Hypoxemia: Secondary | ICD-10-CM | POA: Diagnosis not present

## 2015-05-05 ENCOUNTER — Ambulatory Visit (INDEPENDENT_AMBULATORY_CARE_PROVIDER_SITE_OTHER): Payer: Medicare Other | Admitting: *Deleted

## 2015-05-05 DIAGNOSIS — I480 Paroxysmal atrial fibrillation: Secondary | ICD-10-CM | POA: Diagnosis not present

## 2015-05-05 DIAGNOSIS — I4891 Unspecified atrial fibrillation: Secondary | ICD-10-CM | POA: Diagnosis not present

## 2015-05-05 DIAGNOSIS — Z7901 Long term (current) use of anticoagulants: Secondary | ICD-10-CM | POA: Diagnosis not present

## 2015-05-05 LAB — POCT INR: INR: 3.3

## 2015-05-18 ENCOUNTER — Other Ambulatory Visit: Payer: Self-pay | Admitting: Family Medicine

## 2015-05-26 ENCOUNTER — Ambulatory Visit (INDEPENDENT_AMBULATORY_CARE_PROVIDER_SITE_OTHER): Payer: Medicare Other | Admitting: *Deleted

## 2015-05-26 ENCOUNTER — Other Ambulatory Visit: Payer: Self-pay | Admitting: Family Medicine

## 2015-05-26 DIAGNOSIS — I4891 Unspecified atrial fibrillation: Secondary | ICD-10-CM

## 2015-05-26 DIAGNOSIS — I480 Paroxysmal atrial fibrillation: Secondary | ICD-10-CM

## 2015-05-26 DIAGNOSIS — Z7901 Long term (current) use of anticoagulants: Secondary | ICD-10-CM

## 2015-05-26 LAB — POCT INR: INR: 3

## 2015-05-31 ENCOUNTER — Encounter (INDEPENDENT_AMBULATORY_CARE_PROVIDER_SITE_OTHER): Payer: Self-pay | Admitting: Ophthalmology

## 2015-06-03 DIAGNOSIS — R0902 Hypoxemia: Secondary | ICD-10-CM | POA: Diagnosis not present

## 2015-06-09 ENCOUNTER — Encounter (INDEPENDENT_AMBULATORY_CARE_PROVIDER_SITE_OTHER): Payer: Medicare Other | Admitting: Ophthalmology

## 2015-06-09 DIAGNOSIS — I10 Essential (primary) hypertension: Secondary | ICD-10-CM | POA: Diagnosis not present

## 2015-06-09 DIAGNOSIS — H353221 Exudative age-related macular degeneration, left eye, with active choroidal neovascularization: Secondary | ICD-10-CM | POA: Diagnosis not present

## 2015-06-09 DIAGNOSIS — H35033 Hypertensive retinopathy, bilateral: Secondary | ICD-10-CM | POA: Diagnosis not present

## 2015-06-09 DIAGNOSIS — H353114 Nonexudative age-related macular degeneration, right eye, advanced atrophic with subfoveal involvement: Secondary | ICD-10-CM | POA: Diagnosis not present

## 2015-06-09 DIAGNOSIS — H43813 Vitreous degeneration, bilateral: Secondary | ICD-10-CM | POA: Diagnosis not present

## 2015-06-23 ENCOUNTER — Ambulatory Visit (INDEPENDENT_AMBULATORY_CARE_PROVIDER_SITE_OTHER): Payer: Medicare Other | Admitting: *Deleted

## 2015-06-23 DIAGNOSIS — Z7901 Long term (current) use of anticoagulants: Secondary | ICD-10-CM | POA: Diagnosis not present

## 2015-06-23 DIAGNOSIS — I480 Paroxysmal atrial fibrillation: Secondary | ICD-10-CM

## 2015-06-23 DIAGNOSIS — I4891 Unspecified atrial fibrillation: Secondary | ICD-10-CM

## 2015-06-23 LAB — POCT INR: INR: 1.5

## 2015-06-24 ENCOUNTER — Other Ambulatory Visit: Payer: Self-pay | Admitting: Family Medicine

## 2015-07-04 DIAGNOSIS — R0902 Hypoxemia: Secondary | ICD-10-CM | POA: Diagnosis not present

## 2015-07-07 ENCOUNTER — Ambulatory Visit (INDEPENDENT_AMBULATORY_CARE_PROVIDER_SITE_OTHER): Payer: Medicare Other | Admitting: *Deleted

## 2015-07-07 DIAGNOSIS — I480 Paroxysmal atrial fibrillation: Secondary | ICD-10-CM | POA: Diagnosis not present

## 2015-07-07 DIAGNOSIS — Z7901 Long term (current) use of anticoagulants: Secondary | ICD-10-CM | POA: Diagnosis not present

## 2015-07-07 DIAGNOSIS — I4891 Unspecified atrial fibrillation: Secondary | ICD-10-CM | POA: Diagnosis not present

## 2015-07-07 LAB — POCT INR: INR: 2.7

## 2015-07-09 ENCOUNTER — Other Ambulatory Visit: Payer: Self-pay | Admitting: Family Medicine

## 2015-07-09 DIAGNOSIS — E785 Hyperlipidemia, unspecified: Secondary | ICD-10-CM | POA: Diagnosis not present

## 2015-07-09 DIAGNOSIS — I10 Essential (primary) hypertension: Secondary | ICD-10-CM | POA: Diagnosis not present

## 2015-07-09 DIAGNOSIS — E038 Other specified hypothyroidism: Secondary | ICD-10-CM | POA: Diagnosis not present

## 2015-07-09 DIAGNOSIS — R7301 Impaired fasting glucose: Secondary | ICD-10-CM | POA: Diagnosis not present

## 2015-07-09 LAB — COMPLETE METABOLIC PANEL WITH GFR
ALBUMIN: 4.2 g/dL (ref 3.6–5.1)
ALK PHOS: 64 U/L (ref 33–130)
ALT: 10 U/L (ref 6–29)
AST: 14 U/L (ref 10–35)
BILIRUBIN TOTAL: 0.6 mg/dL (ref 0.2–1.2)
BUN: 21 mg/dL (ref 7–25)
CO2: 27 mmol/L (ref 20–31)
Calcium: 9.9 mg/dL (ref 8.6–10.4)
Chloride: 100 mmol/L (ref 98–110)
Creat: 1.09 mg/dL — ABNORMAL HIGH (ref 0.60–0.88)
GFR, EST AFRICAN AMERICAN: 53 mL/min — AB (ref >=60)
GFR, Est Non African American: 46 mL/min — ABNORMAL LOW (ref >=60)
GLUCOSE: 106 mg/dL — AB (ref 65–99)
Potassium: 3.9 mmol/L (ref 3.5–5.3)
Sodium: 139 mmol/L (ref 135–146)
TOTAL PROTEIN: 7.2 g/dL (ref 6.1–8.1)

## 2015-07-09 LAB — CBC
HCT: 35.3 % — ABNORMAL LOW (ref 36.0–46.0)
HEMOGLOBIN: 11.9 g/dL — AB (ref 12.0–15.0)
MCH: 29.2 pg (ref 26.0–34.0)
MCHC: 33.7 g/dL (ref 30.0–36.0)
MCV: 86.5 fL (ref 78.0–100.0)
MPV: 9.2 fL (ref 8.6–12.4)
Platelets: 342 10*3/uL (ref 150–400)
RBC: 4.08 MIL/uL (ref 3.87–5.11)
RDW: 13.1 % (ref 11.5–15.5)
WBC: 9.2 10*3/uL (ref 4.0–10.5)

## 2015-07-09 LAB — LIPID PANEL
Cholesterol: 152 mg/dL (ref 125–200)
HDL: 41 mg/dL — ABNORMAL LOW (ref >=46)
LDL CALC: 60 mg/dL (ref <130)
TRIGLYCERIDES: 254 mg/dL — AB (ref <150)
Total CHOL/HDL Ratio: 3.7 Ratio (ref <=5.0)
VLDL: 51 mg/dL — ABNORMAL HIGH (ref <30)

## 2015-07-10 LAB — TSH: TSH: 1.594 u[IU]/mL (ref 0.350–4.500)

## 2015-07-13 LAB — HEMOGLOBIN A1C
HEMOGLOBIN A1C: 5.5 % (ref <5.7)
Mean Plasma Glucose: 111 mg/dL (ref <117)

## 2015-07-14 ENCOUNTER — Other Ambulatory Visit: Payer: Self-pay | Admitting: Family Medicine

## 2015-07-15 ENCOUNTER — Ambulatory Visit (INDEPENDENT_AMBULATORY_CARE_PROVIDER_SITE_OTHER): Payer: Medicare Other | Admitting: Family Medicine

## 2015-07-15 ENCOUNTER — Encounter: Payer: Self-pay | Admitting: Family Medicine

## 2015-07-15 VITALS — BP 118/64 | HR 76 | Resp 16 | Ht 66.0 in | Wt 156.4 lb

## 2015-07-15 DIAGNOSIS — R7301 Impaired fasting glucose: Secondary | ICD-10-CM | POA: Diagnosis not present

## 2015-07-15 DIAGNOSIS — E038 Other specified hypothyroidism: Secondary | ICD-10-CM

## 2015-07-15 DIAGNOSIS — I10 Essential (primary) hypertension: Secondary | ICD-10-CM

## 2015-07-15 DIAGNOSIS — I495 Sick sinus syndrome: Secondary | ICD-10-CM

## 2015-07-15 DIAGNOSIS — Z23 Encounter for immunization: Secondary | ICD-10-CM

## 2015-07-15 DIAGNOSIS — K219 Gastro-esophageal reflux disease without esophagitis: Secondary | ICD-10-CM

## 2015-07-15 DIAGNOSIS — E785 Hyperlipidemia, unspecified: Secondary | ICD-10-CM

## 2015-07-15 NOTE — Progress Notes (Signed)
   Subjective:    Patient ID: [name], female    DOB: [birth year], 81 y.o.   MRN: [id number]  HPI    Review of Systems     Objective:   Physical Exam        Assessment & Plan:

## 2015-07-15 NOTE — Patient Instructions (Signed)
F/u in 4.5 month, call if you need me sooner  Excellent labs, no med changes, sugar now normal  Excellent exam today  Continue to use cane so no falls  Flu vaccine today  IF you get flu symptoms next week, call for  tamiflu to be sent next week only, I will speak with staff about this. If you are too sick and need to be seen , you will need to go to urgent care or the ED next week  TSH, non ft and chem 7 in 4.5 months  Thanks for choosing [facility], we consider it a privelige to serve you.   ALL the best for holiday season and 2017!

## 2015-07-16 NOTE — Assessment & Plan Note (Signed)
Hyperlipidemia:Low fat diet discussed and encouraged.   Lipid Panel  Lab Results  Component Value Date   CHOL 152 07/09/2015   HDL 41* 07/09/2015   LDLCALC 60 07/09/2015   TRIG 254* 07/09/2015   CHOLHDL 3.7 07/09/2015   Improved, no med change. Needs to continue to reduce fat in diet

## 2015-07-16 NOTE — Assessment & Plan Note (Signed)
Controlled, no change in medication  

## 2015-07-16 NOTE — Assessment & Plan Note (Signed)
Controlled, no change in medication DASH diet and commitment to daily physical activity for a minimum of 30 minutes discussed and encouraged, as a part of hypertension management. The importance of attaining a healthy weight is also discussed.  BP/Weight 07/15/2015 04/22/2015 03/04/2015 02/15/2015 02/10/2015 02/09/2015 0000000  Systolic BP 123456 123456 0000000 Q000111Q 123XX123 Q000111Q 0000000  Diastolic BP 64 64 66 62 72 58 64  Wt. (Lbs) 156.4 157 158 157 - 153 153  BMI 25.26 25.35 25.51 25.35 - 25.46 24.71

## 2015-07-16 NOTE — Assessment & Plan Note (Signed)
Stable , has pacemaker,chronically anticoagullated and monitored at coumadin clinic

## 2015-07-16 NOTE — Assessment & Plan Note (Signed)
Improved and normalized at most recent check, pt applauded on tis Patient educated about the importance of limiting  Carbohydrate intake , the need to commit to daily physical activity for a minimum of 30 minutes , and to commit weight loss. The fact that changes in all these areas will reduce or eliminate all together the development of diabetes is stressed.   Diabetic Labs Latest Ref Rng 07/09/2015 02/17/2015 02/10/2015 02/09/2015 12/14/2014  HbA1c <5.7 % 5.5 - - - 5.7(H)  Chol 125 - 200 mg/dL 152 - - - 154  HDL >=46 mg/dL 41(L) - - - 38(L)  Calc LDL <130 mg/dL 60 - - - 61  Triglycerides <150 mg/dL 254(H) - - - 277(H)  Creatinine 0.60 - 0.88 mg/dL 1.09(H) 0.94 0.77 0.80 0.91   BP/Weight 07/15/2015 04/22/2015 03/04/2015 02/15/2015 02/10/2015 02/09/2015 0000000  Systolic BP 123456 123456 0000000 Q000111Q 123XX123 Q000111Q 0000000  Diastolic BP 64 64 66 62 72 58 64  Wt. (Lbs) 156.4 157 158 157 - 153 153  BMI 25.26 25.35 25.51 25.35 - 25.46 24.71   No flowsheet data found.

## 2015-07-16 NOTE — Progress Notes (Signed)
[name]     MRN: [id number]      DOB: Jan 24, 1926   HPI [name] is here for follow up and re-evaluation of chronic medical conditions, medication management and review of any available recent lab and radiology data.  Preventive health is updated, specifically  Cancer screening and Immunization.   Questions or concerns regarding consultations or procedures which the PT has had in the interim are  addressed. The PT denies any adverse reactions to current medications since the last visit.  There are no new concerns.  There are no specific complaints   ROS Denies recent fever or chills. Denies sinus pressure, nasal congestion, ear pain or sore throat. Denies chest congestion, productive cough or wheezing. Denies chest pains, palpitations and leg swelling Denies abdominal pain, nausea, vomiting,diarrhea or constipation.   Denies dysuria, frequency, hesitancy or incontinence. Denies joint pain, swelling and limitation in mobility. Denies headaches, seizures, numbness, or tingling. Denies depression, anxiety or insomnia. Denies skin break down or rash.   PE  BP 118/64 mmHg  Pulse 76  Resp 16  Ht 5\' 6"  (1.676 m)  Wt 156 lb 6.4 oz (70.943 kg)  BMI 25.26 kg/m2  SpO2 97%  Patient alert and oriented and in no cardiopulmonary distress.  HEENT: No facial asymmetry, EOMI,   oropharynx pink and moist.  Neck supple no JVD, no mass.  Chest: Clear to auscultation bilaterally.  CVS: S1, S2 no murmurs, no S3.Regular rate.  ABD: Soft non tender.   Ext: No edema  MS: Adequate though reduced  ROM spine, shoulders, hips and knees.  Skin: Intact, no ulcerations or rash noted.  Psych: Good eye contact, normal affect. Memory intact not anxious or depressed appearing.  CNS: CN 2-12 intact, power,  normal throughout.no focal deficits noted.   Assessment & Plan   Hypothyroidism Controlled, no change in medication   Essential hypertension Controlled, no change in  medication DASH diet and commitment to daily physical activity for a minimum of 30 minutes discussed and encouraged, as a part of hypertension management. The importance of attaining a healthy weight is also discussed.  BP/Weight 07/15/2015 04/22/2015 03/04/2015 02/15/2015 02/10/2015 02/09/2015 0000000  Systolic BP 123456 123456 0000000 Q000111Q 123XX123 Q000111Q 0000000  Diastolic BP 64 64 66 62 72 58 64  Wt. (Lbs) 156.4 157 158 157 - 153 153  BMI 25.26 25.35 25.51 25.35 - 25.46 24.71        Impaired fasting glucose Improved and normalized at most recent check, pt applauded on tis Patient educated about the importance of limiting  Carbohydrate intake , the need to commit to daily physical activity for a minimum of 30 minutes , and to commit weight loss. The fact that changes in all these areas will reduce or eliminate all together the development of diabetes is stressed.   Diabetic Labs Latest Ref Rng 07/09/2015 02/17/2015 02/10/2015 02/09/2015 12/14/2014  HbA1c <5.7 % 5.5 - - - 5.7(H)  Chol 125 - 200 mg/dL 152 - - - 154  HDL >=46 mg/dL 41(L) - - - 38(L)  Calc LDL <130 mg/dL 60 - - - 61  Triglycerides <150 mg/dL 254(H) - - - 277(H)  Creatinine 0.60 - 0.88 mg/dL 1.09(H) 0.94 0.77 0.80 0.91   BP/Weight 07/15/2015 04/22/2015 03/04/2015 02/15/2015 02/10/2015 02/09/2015 0000000  Systolic BP 123456 123456 0000000 Q000111Q 123XX123 Q000111Q 0000000  Diastolic BP 64 64 66 62 72 58 64  Wt. (Lbs) 156.4 157 158 157 - 153 153  BMI 25.26 25.35 25.51 25.35 -  25.46 24.71   No flowsheet data found.     GERD (gastroesophageal reflux disease) Controlled, no change in medication   Sick sinus syndrome Stable , has pacemaker,chronically anticoagullated and monitored at coumadin clinic  Hyperlipidemia Hyperlipidemia:Low fat diet discussed and encouraged.   Lipid Panel  Lab Results  Component Value Date   CHOL 152 07/09/2015   HDL 41* 07/09/2015   LDLCALC 60 07/09/2015   TRIG 254* 07/09/2015   CHOLHDL 3.7 07/09/2015   Improved, no med  change. Needs to continue to reduce fat in diet

## 2015-07-23 ENCOUNTER — Other Ambulatory Visit: Payer: Self-pay | Admitting: Family Medicine

## 2015-07-26 ENCOUNTER — Encounter: Payer: Self-pay | Admitting: Cardiology

## 2015-07-26 ENCOUNTER — Ambulatory Visit (INDEPENDENT_AMBULATORY_CARE_PROVIDER_SITE_OTHER): Payer: Medicare Other | Admitting: Cardiology

## 2015-07-26 ENCOUNTER — Ambulatory Visit (INDEPENDENT_AMBULATORY_CARE_PROVIDER_SITE_OTHER): Payer: Medicare Other | Admitting: *Deleted

## 2015-07-26 VITALS — BP 130/66 | HR 63 | Ht 65.0 in | Wt 156.0 lb

## 2015-07-26 DIAGNOSIS — Z7901 Long term (current) use of anticoagulants: Secondary | ICD-10-CM | POA: Diagnosis not present

## 2015-07-26 DIAGNOSIS — I4891 Unspecified atrial fibrillation: Secondary | ICD-10-CM

## 2015-07-26 DIAGNOSIS — I480 Paroxysmal atrial fibrillation: Secondary | ICD-10-CM | POA: Diagnosis not present

## 2015-07-26 DIAGNOSIS — I495 Sick sinus syndrome: Secondary | ICD-10-CM

## 2015-07-26 DIAGNOSIS — E785 Hyperlipidemia, unspecified: Secondary | ICD-10-CM | POA: Diagnosis not present

## 2015-07-26 DIAGNOSIS — I10 Essential (primary) hypertension: Secondary | ICD-10-CM | POA: Diagnosis not present

## 2015-07-26 LAB — POCT INR: INR: 2.5

## 2015-07-26 NOTE — Patient Instructions (Signed)
Medication Instructions:  Your physician recommends that you continue on your current medications as directed. Please refer to the Current Medication list given to you today.   Labwork: NONE  Testing/Procedures: NONE  Follow-Up: Your physician wants you to follow-up in: [place] DR. BRANCH. You will receive a reminder letter in the mail two months in advance. If you don't receive a letter, please call our office to schedule the follow-up appointment.   Any Other Special Instructions Will Be Listed Below (If Applicable).     If you need a refill on your cardiac medications before your next appointment, please call your pharmacy.  Thanks for choosing [facility]!!!

## 2015-07-26 NOTE — Progress Notes (Signed)
Patient ID: [name], female   DOB: 09-Jun-1926, 81 y.o.   MRN: [id number]     Clinical Summary [name] is a 81 y.o.female seen today for follow up of the following medical problems   1. Paroxysmal afib - denies any palpitations.  - denies any bleeding problems on coumadin. Has not been interested in NOACs.   2. Symptomatic bradycardia - pacemaker with normal function at last device check 01/2015 - denies any lightheadness, no dizziness.   3. HTN - compliant with meds  4. Hyperlipidemia - compliant with statin, followed by Dr Moshe Cipro - last lipid panel 06/2015 TC 152 TG 254 HDL 41 LDL 60 - She endorses eating a lot of sweets and pastries.     Past Medical History  Diagnosis Date  . Hypertension   . Hyperlipidemia   . Pancreatitis   . Gastroesophageal reflux disease   . Impaired glucose tolerance   . Hypothyroidism   . Macular degeneration   . Sleep apnea     Nocturnal oxygen therapy  . Arthritis   . Sick sinus syndrome ([place]) 2011    Atrial fibrilation 10/2009; and dual chamber pacemaker in 4/11; normal EF  . Paroxysmal atrial fibrillation ([place]) 2011  . Hearing impairment   . Zoster 2016     No Known Allergies   Current Outpatient Prescriptions  Medication Sig Dispense Refill  . Calcium Carb-Cholecalciferol (CALCIUM 500 +D) 500-400 MG-UNIT TABS Take 1 capsule by mouth daily.    Marland Kitchen DIGOX 125 MCG tablet TAKE 1 TABLET BY MOUTH ONCE A DAY. 30 tablet 11  . fluticasone (FLONASE) 50 MCG/ACT nasal spray USE 1 SPRAY IN EACH NOSTRIL DAILY. 16 g 2  . hydrochlorothiazide (HYDRODIURIL) 25 MG tablet TAKE 1 TABLET BY MOUTH ONCE DAILY. 30 tablet 4  . levothyroxine (SYNTHROID, LEVOTHROID) 100 MCG tablet TAKE 1 TABLET BY MOUTH ONCE DAILY. 30 tablet 3  . lovastatin (MEVACOR) 40 MG tablet TAKE ONE TABLET BY MOUTH DAILY. 30 tablet 2  . metoprolol (LOPRESSOR) 100 MG tablet TAKE 1 TABLET BY MOUTH TWICE A DAY. 60 tablet 3  . multivitamin-lutein (OCUVITE-LUTEIN) CAPS Take 1  capsule by mouth daily.    Marland Kitchen omeprazole (PRILOSEC) 40 MG capsule TAKE (1) CAPSULE BY MOUTH ONCE DAILY FOR ACID REFLUX. 30 capsule 5  . OXYGEN-HELIUM IN Inhale 2 L into the lungs at bedtime.    . Potassium 99 MG TABS Take 1 tablet by mouth daily.    Marland Kitchen warfarin (COUMADIN) 5 MG tablet TAKE 1/2 TABLET BY MOUTH DAILY EXCEPT 1 TABLET ON SUNDAYS AND WEDNESDAYS OR AS DIRECTED. 45 tablet 3   No current facility-administered medications for this visit.     Past Surgical History  Procedure Laterality Date  . Abdominal hysterectomy    . Salpingoophorectomy  1970    right  . Cataract extraction      Bilateral  . Pacemaker insertion  2011    dual chamber   . Colonoscopy  2009    Dr. Gala Romney  . Eye surgery      laser      No Known Allergies    Family History  Problem Relation Age of Onset  . Cancer Mother     gential  . Cancer Father     throat   . Heart disease    . Arthritis    . Lung disease    . Asthma    . Pneumonia Sister      Social History [name] reports that she has  never smoked. She has never used smokeless tobacco. [name] reports that she does not drink alcohol.   Review of Systems CONSTITUTIONAL: No weight loss, fever, chills, weakness or fatigue.  HEENT: Eyes: No visual loss, blurred vision, double vision or yellow sclerae.No hearing loss, sneezing, congestion, runny nose or sore throat.  SKIN: No rash or itching.  CARDIOVASCULAR: per HPI RESPIRATORY: No shortness of breath, cough or sputum.  GASTROINTESTINAL: No anorexia, nausea, vomiting or diarrhea. No abdominal pain or blood.  GENITOURINARY: No burning on urination, no polyuria NEUROLOGICAL: No headache, dizziness, syncope, paralysis, ataxia, numbness or tingling in the extremities. No change in bowel or bladder control.  MUSCULOSKELETAL: No muscle, back pain, joint pain or stiffness.  LYMPHATICS: No enlarged nodes. No history of splenectomy.  PSYCHIATRIC: No history of depression or anxiety.    ENDOCRINOLOGIC: No reports of sweating, cold or heat intolerance. No polyuria or polydipsia.  Marland Kitchen   Physical Examination Filed Vitals:   07/26/15 1127  BP: 130/66  Pulse: 63   Filed Vitals:   07/26/15 1127  Height: 5\' 5"  (1.651 m)  Weight: 156 lb (70.761 kg)    Gen: resting comfortably, no acute distress HEENT: no scleral icterus, pupils equal round and reactive, no palptable cervical adenopathy,  CV: RRR, no m/r/g., no jvd Resp: Clear to auscultation bilaterally GI: abdomen is soft, non-tender, non-distended, normal bowel sounds, no hepatosplenomegaly MSK: extremities are warm, no edema.  Skin: warm, no rash Neuro:  no focal deficits Psych: appropriate affect    Assessment and Plan   1. Afib - no significant symptoms - continue current meds including coumadin for stroke prevention  2. HTN - at goal, continue current meds.   3. HL - LDL at goal, continue current statin.  Counseled on dietary changes to help decrease TGs including cutting down on sweets and pastries.   4. Symptomatic bradycardia - normal pacemaker function on last check, we will make sure she is scheduled for f/u    F/u 1 year   Arnoldo Lenis, M.D.,

## 2015-08-03 DIAGNOSIS — R0902 Hypoxemia: Secondary | ICD-10-CM | POA: Diagnosis not present

## 2015-08-16 ENCOUNTER — Encounter: Payer: Self-pay | Admitting: Internal Medicine

## 2015-08-16 ENCOUNTER — Ambulatory Visit (INDEPENDENT_AMBULATORY_CARE_PROVIDER_SITE_OTHER): Payer: Medicare Other | Admitting: *Deleted

## 2015-08-16 DIAGNOSIS — I495 Sick sinus syndrome: Secondary | ICD-10-CM | POA: Diagnosis not present

## 2015-08-16 DIAGNOSIS — I480 Paroxysmal atrial fibrillation: Secondary | ICD-10-CM | POA: Diagnosis not present

## 2015-08-16 LAB — CUP PACEART INCLINIC DEVICE CHECK
Battery Remaining Longevity: 10.4
Battery Voltage: 2.78 V
Brady Statistic RA Percent Paced: 94 %
Brady Statistic RV Percent Paced: 1.1 %
Date Time Interrogation Session: 20161219134238
Implantable Lead Implant Date: 20110422
Implantable Lead Implant Date: 20110422
Implantable Lead Location: 753859
Implantable Lead Location: 753860
Lead Channel Impedance Value: 362.5 Ohm
Lead Channel Impedance Value: 425 Ohm
Lead Channel Pacing Threshold Amplitude: 1.25 V
Lead Channel Pacing Threshold Amplitude: 1.25 V
Lead Channel Pacing Threshold Amplitude: 1.75 V
Lead Channel Pacing Threshold Amplitude: 1.75 V
Lead Channel Pacing Threshold Pulse Width: 0.5 ms
Lead Channel Pacing Threshold Pulse Width: 0.5 ms
Lead Channel Pacing Threshold Pulse Width: 1.2 ms
Lead Channel Pacing Threshold Pulse Width: 1.2 ms
Lead Channel Sensing Intrinsic Amplitude: 1 mV
Lead Channel Sensing Intrinsic Amplitude: 7.7 mV
Lead Channel Setting Pacing Amplitude: 2.5 V
Lead Channel Setting Pacing Amplitude: 3.5 V
Lead Channel Setting Pacing Pulse Width: 0.5 ms
Lead Channel Setting Sensing Sensitivity: 2 mV
Pulse Gen Model: 2110
Pulse Gen Serial Number: 7117341

## 2015-08-16 NOTE — Progress Notes (Signed)
Pacemaker check in clinic. Normal device function. Thresholds, sensing, impedances consistent with previous measurements. Device programmed to maximize longevity. (1357) mode switches (1.1%)---max dur. 1hr 20 mins, Max A 530, Max V 165 + warfarin. No high ventricular rates noted. RA output increased from 3.0V to 3.5V @ 1.16ms for appropriate safety margin. Histogram distribution appropriate for patient activity level. Device programmed to optimize intrinsic conduction. Estimated longevity 11.6 months. Patient will follow up with GT/R in 6 months.

## 2015-08-18 ENCOUNTER — Ambulatory Visit (HOSPITAL_COMMUNITY)
Admission: RE | Admit: 2015-08-18 | Discharge: 2015-08-18 | Disposition: A | Discharge status: Home / Self Care | Payer: Medicare Other | Source: Ambulatory Visit | Attending: Family Medicine | Admitting: Family Medicine

## 2015-08-18 DIAGNOSIS — Z1231 Encounter for screening mammogram for malignant neoplasm of breast: Secondary | ICD-10-CM | POA: Diagnosis not present

## 2015-09-01 ENCOUNTER — Ambulatory Visit (INDEPENDENT_AMBULATORY_CARE_PROVIDER_SITE_OTHER): Payer: Medicare Other | Admitting: *Deleted

## 2015-09-01 DIAGNOSIS — I4891 Unspecified atrial fibrillation: Secondary | ICD-10-CM | POA: Diagnosis not present

## 2015-09-01 DIAGNOSIS — Z7901 Long term (current) use of anticoagulants: Secondary | ICD-10-CM

## 2015-09-01 DIAGNOSIS — I480 Paroxysmal atrial fibrillation: Secondary | ICD-10-CM

## 2015-09-01 LAB — POCT INR: INR: 2.3

## 2015-09-03 DIAGNOSIS — R0902 Hypoxemia: Secondary | ICD-10-CM | POA: Diagnosis not present

## 2015-09-13 ENCOUNTER — Encounter (INDEPENDENT_AMBULATORY_CARE_PROVIDER_SITE_OTHER): Payer: Self-pay | Admitting: Ophthalmology

## 2015-09-14 ENCOUNTER — Other Ambulatory Visit: Payer: Self-pay | Admitting: Cardiology

## 2015-09-20 ENCOUNTER — Encounter (INDEPENDENT_AMBULATORY_CARE_PROVIDER_SITE_OTHER): Payer: Self-pay | Admitting: Ophthalmology

## 2015-09-22 ENCOUNTER — Other Ambulatory Visit: Payer: Self-pay | Admitting: Family Medicine

## 2015-09-22 ENCOUNTER — Encounter (INDEPENDENT_AMBULATORY_CARE_PROVIDER_SITE_OTHER): Payer: Self-pay | Admitting: Ophthalmology

## 2015-10-04 DIAGNOSIS — R0902 Hypoxemia: Secondary | ICD-10-CM | POA: Diagnosis not present

## 2015-10-08 ENCOUNTER — Encounter (INDEPENDENT_AMBULATORY_CARE_PROVIDER_SITE_OTHER): Payer: Medicare Other | Admitting: Ophthalmology

## 2015-10-08 DIAGNOSIS — H35033 Hypertensive retinopathy, bilateral: Secondary | ICD-10-CM

## 2015-10-08 DIAGNOSIS — I10 Essential (primary) hypertension: Secondary | ICD-10-CM | POA: Diagnosis not present

## 2015-10-08 DIAGNOSIS — H353231 Exudative age-related macular degeneration, bilateral, with active choroidal neovascularization: Secondary | ICD-10-CM | POA: Diagnosis not present

## 2015-10-08 DIAGNOSIS — H43813 Vitreous degeneration, bilateral: Secondary | ICD-10-CM | POA: Diagnosis not present

## 2015-10-13 ENCOUNTER — Ambulatory Visit (INDEPENDENT_AMBULATORY_CARE_PROVIDER_SITE_OTHER): Payer: Medicare Other | Admitting: *Deleted

## 2015-10-13 DIAGNOSIS — I480 Paroxysmal atrial fibrillation: Secondary | ICD-10-CM

## 2015-10-13 DIAGNOSIS — Z5181 Encounter for therapeutic drug level monitoring: Secondary | ICD-10-CM

## 2015-10-13 LAB — POCT INR: INR: 4.1

## 2015-10-21 ENCOUNTER — Ambulatory Visit (INDEPENDENT_AMBULATORY_CARE_PROVIDER_SITE_OTHER): Payer: Medicare Other | Admitting: Family Medicine

## 2015-10-21 ENCOUNTER — Telehealth: Payer: Self-pay

## 2015-10-21 ENCOUNTER — Encounter: Payer: Self-pay | Admitting: Family Medicine

## 2015-10-21 VITALS — BP 118/64 | HR 97 | Temp 98.0°F | Resp 16 | Ht 65.0 in | Wt 159.0 lb

## 2015-10-21 DIAGNOSIS — R7301 Impaired fasting glucose: Secondary | ICD-10-CM

## 2015-10-21 DIAGNOSIS — E038 Other specified hypothyroidism: Secondary | ICD-10-CM | POA: Diagnosis not present

## 2015-10-21 DIAGNOSIS — E785 Hyperlipidemia, unspecified: Secondary | ICD-10-CM | POA: Diagnosis not present

## 2015-10-21 DIAGNOSIS — R296 Repeated falls: Secondary | ICD-10-CM | POA: Diagnosis not present

## 2015-10-21 DIAGNOSIS — I10 Essential (primary) hypertension: Secondary | ICD-10-CM

## 2015-10-21 DIAGNOSIS — J018 Other acute sinusitis: Secondary | ICD-10-CM | POA: Diagnosis not present

## 2015-10-21 DIAGNOSIS — J019 Acute sinusitis, unspecified: Secondary | ICD-10-CM | POA: Insufficient documentation

## 2015-10-21 DIAGNOSIS — Z9181 History of falling: Secondary | ICD-10-CM | POA: Insufficient documentation

## 2015-10-21 DIAGNOSIS — J302 Other seasonal allergic rhinitis: Secondary | ICD-10-CM

## 2015-10-21 MED ORDER — PENICILLIN V POTASSIUM 500 MG PO TABS: 500.0000 mg | ORAL_TABLET | Freq: Three times a day (TID) | ORAL | Status: DC

## 2015-10-21 NOTE — Telephone Encounter (Signed)
Daughter and front staff aware.

## 2015-10-21 NOTE — Progress Notes (Signed)
   Subjective:    Patient ID: [name], female    DOB: Mar 03, 1926, 82 y.o.   MRN: [id number]  HPI 2 day h/o dry cough and sore throat, a lot of exposure to respiratory illness in the home , daughter concerned that her  Mom does not become ill also Pt denies ear pain or sore throat, does report left facial pressure  Fell earlier this week, tripped on dog leash, hit right knee and back of her head, no cut , abrasion or LOC, feels well , no memory change, ambulating and weight bearing as before   Review of Systems See HPI Denies recent fever , has had a few chills.  Denies chest pains, palpitations and leg swelling Denies abdominal pain, nausea, vomiting,diarrhea or constipation.   Denies dysuria, frequency, hesitancy or incontinence. Chronic and unchanged joint pain,  and limitation in mobility. Denies headaches, seizures, numbness, or tingling. Denies depression, anxiety or insomnia. Denies skin break down or rash.        Objective:   Physical Exam  BP 118/64 mmHg  Pulse 97  Temp(Src) 98 F (36.7 C) (Oral)  Resp 16  Ht 5\' 5"  (1.651 m)  Wt 159 lb (72.122 kg)  BMI 26.46 kg/m2  SpO2 98% Patient alert and oriented and in no cardiopulmonary distress.  HEENT: No facial asymmetry, EOMI,   oropharynx pink and moist.  Neck supple no JVD, no mass. Left maxillary tenderness, TM clear, erythema and edema of nasal mucosa Chest: Clear to auscultation bilaterally.  CVS: S1, S2 no murmurs, no S3.Regular rate.  ABD: Soft non tender.   Ext: No edema  MS: Adequate ROM spine, shoulders, hips and knees.  Skin: Intact, no ulcerations or rash noted.  Psych: Good eye contact, normal affect. Memory intact not anxious or depressed appearing.  CNS: CN 2-12 intact, power,  normal throughout.no focal deficits noted.       Assessment & Plan:  Falls frequently 2 falls in past 1 month, refer PT Home safety reviewed, and written material provided  Sinusitis, acute Left maxillary  sinusitis, penicillin prescribed for 1 week  Essential hypertension Controlled, no change in medication   Seasonal allergies Currently controlled and asymptomatic  Hypothyroidism Updated lab needed at/ before next visit.

## 2015-10-21 NOTE — Patient Instructions (Signed)
F/u as before  Call if you need me sooner  NEW Fasting lipid, cmp and TSH for April labs (shred old sheet )  Penicillin sent for 1 week for cough and drainage  Thanks for choosing [facility], we consider it a privelige to serve you.   You are referred to PT due to falls, VERY important that you go    Fall Prevention in the Home  Falls can cause injuries. They can happen to people of all ages. There are many things you can do to make your home safe and to help prevent falls.  WHAT CAN I DO ON THE OUTSIDE OF MY HOME?  Regularly fix the edges of walkways and driveways and fix any cracks.  Remove anything that might make you trip as you walk through a door, such as a raised step or threshold.  Trim any bushes or trees on the path to your home.  Use bright outdoor lighting.  Clear any walking paths of anything that might make someone trip, such as rocks or tools.  Regularly check to see if handrails are loose or broken. Make sure that both sides of any steps have handrails.  Any raised decks and porches should have guardrails on the edges.  Have any leaves, snow, or ice cleared regularly.  Use sand or salt on walking paths during winter.  Clean up any spills in your garage right away. This includes oil or grease spills. WHAT CAN I DO IN THE BATHROOM?   Use night lights.  Install grab bars by the toilet and in the tub and shower. Do not use towel bars as grab bars.  Use non-skid mats or decals in the tub or shower.  If you need to sit down in the shower, use a plastic, non-slip stool.  Keep the floor dry. Clean up any water that spills on the floor as soon as it happens.  Remove soap buildup in the tub or shower regularly.  Attach bath mats securely with double-sided non-slip rug tape.  Do not have throw rugs and other things on the floor that can make you trip. WHAT CAN I DO IN THE BEDROOM?  Use night lights.  Make sure that you have a light by your  bed that is easy to reach.  Do not use any sheets or blankets that are too big for your bed. They should not hang down onto the floor.  Have a firm chair that has side arms. You can use this for support while you get dressed.  Do not have throw rugs and other things on the floor that can make you trip. WHAT CAN I DO IN THE KITCHEN?  Clean up any spills right away.  Avoid walking on wet floors.  Keep items that you use a lot in easy-to-reach places.  If you need to reach something above you, use a strong step stool that has a grab bar.  Keep electrical cords out of the way.  Do not use floor polish or wax that makes floors slippery. If you must use wax, use non-skid floor wax.  Do not have throw rugs and other things on the floor that can make you trip. WHAT CAN I DO WITH MY STAIRS?  Do not leave any items on the stairs.  Make sure that there are handrails on both sides of the stairs and use them. Fix handrails that are broken or loose. Make sure that handrails are as long as the stairways.  Check any carpeting  to make sure that it is firmly attached to the stairs. Fix any carpet that is loose or worn.  Avoid having throw rugs at the top or bottom of the stairs. If you do have throw rugs, attach them to the floor with carpet tape.  Make sure that you have a light switch at the top of the stairs and the bottom of the stairs. If you do not have them, ask someone to add them for you. WHAT ELSE CAN I DO TO HELP PREVENT FALLS?  Wear shoes that:  Do not have high heels.  Have rubber bottoms.  Are comfortable and fit you well.  Are closed at the toe. Do not wear sandals.  If you use a stepladder:  Make sure that it is fully opened. Do not climb a closed stepladder.  Make sure that both sides of the stepladder are locked into place.  Ask someone to hold it for you, if possible.  Clearly mark and make sure that you can see:  Any grab bars or handrails.  First and last  steps.  Where the edge of each step is.  Use tools that help you move around (mobility aids) if they are needed. These include:  Canes.  Walkers.  Scooters.  Crutches.  Turn on the lights when you go into a dark area. Replace any light bulbs as soon as they burn out.  Set up your furniture so you have a clear path. Avoid moving your furniture around.  If any of your floors are uneven, fix them.  If there are any pets around you, be aware of where they are.  Review your medicines with your doctor. Some medicines can make you feel dizzy. This can increase your chance of falling. Ask your doctor what other things that you can do to help prevent falls.   This information is not intended to replace advice given to you by your health care provider. Make sure you discuss any questions you have with your health care provider.   Document Released: 06/10/2009 Document Revised: 12/29/2014 Document Reviewed: 09/18/2014 Elsevier Interactive Patient Education [employer].

## 2015-10-21 NOTE — Telephone Encounter (Signed)
Work in today [employer]

## 2015-10-21 NOTE — Assessment & Plan Note (Addendum)
2 falls in past 1 month, refer PT Home safety reviewed, and written material provided

## 2015-10-21 NOTE — Telephone Encounter (Signed)
Will work in at 1:30

## 2015-10-23 ENCOUNTER — Other Ambulatory Visit: Payer: Self-pay | Admitting: Family Medicine

## 2015-10-23 NOTE — Assessment & Plan Note (Signed)
Currently controlled and asymptomatic

## 2015-10-23 NOTE — Assessment & Plan Note (Signed)
Controlled, no change in medication  

## 2015-10-23 NOTE — Assessment & Plan Note (Signed)
Updated lab needed at/ before next visit.   

## 2015-10-23 NOTE — Assessment & Plan Note (Signed)
Left maxillary sinusitis, penicillin prescribed for 1 week

## 2015-11-01 ENCOUNTER — Ambulatory Visit (INDEPENDENT_AMBULATORY_CARE_PROVIDER_SITE_OTHER): Payer: Medicare Other | Admitting: *Deleted

## 2015-11-01 DIAGNOSIS — R0902 Hypoxemia: Secondary | ICD-10-CM | POA: Diagnosis not present

## 2015-11-01 DIAGNOSIS — I480 Paroxysmal atrial fibrillation: Secondary | ICD-10-CM

## 2015-11-01 LAB — POCT INR: INR: 2.5

## 2015-11-20 ENCOUNTER — Other Ambulatory Visit: Payer: Self-pay | Admitting: Cardiology

## 2015-11-20 ENCOUNTER — Other Ambulatory Visit: Payer: Self-pay | Admitting: Family Medicine

## 2015-11-25 DIAGNOSIS — I10 Essential (primary) hypertension: Secondary | ICD-10-CM | POA: Diagnosis not present

## 2015-11-25 DIAGNOSIS — E785 Hyperlipidemia, unspecified: Secondary | ICD-10-CM | POA: Diagnosis not present

## 2015-11-26 LAB — COMPREHENSIVE METABOLIC PANEL
ALBUMIN: 4.1 g/dL (ref 3.6–5.1)
ALT: 7 U/L (ref 6–29)
AST: 14 U/L (ref 10–35)
Alkaline Phosphatase: 66 U/L (ref 33–130)
BILIRUBIN TOTAL: 0.6 mg/dL (ref 0.2–1.2)
BUN: 21 mg/dL (ref 7–25)
CHLORIDE: 99 mmol/L (ref 98–110)
CO2: 25 mmol/L (ref 20–31)
CREATININE: 1.08 mg/dL — AB (ref 0.60–0.88)
Calcium: 9.8 mg/dL (ref 8.6–10.4)
GLUCOSE: 110 mg/dL — AB (ref 65–99)
Potassium: 3.9 mmol/L (ref 3.5–5.3)
SODIUM: 137 mmol/L (ref 135–146)
Total Protein: 7.3 g/dL (ref 6.1–8.1)

## 2015-11-26 LAB — LIPID PANEL
CHOL/HDL RATIO: 3.5 ratio (ref <=5.0)
Cholesterol: 151 mg/dL (ref 125–200)
HDL: 43 mg/dL — ABNORMAL LOW (ref >=46)
LDL CALC: 56 mg/dL (ref <130)
Triglycerides: 258 mg/dL — ABNORMAL HIGH (ref <150)
VLDL: 52 mg/dL — ABNORMAL HIGH (ref <30)

## 2015-11-26 LAB — TSH: TSH: 4.96 mIU/L — ABNORMAL HIGH

## 2015-11-29 ENCOUNTER — Ambulatory Visit (INDEPENDENT_AMBULATORY_CARE_PROVIDER_SITE_OTHER): Payer: Medicare Other | Admitting: *Deleted

## 2015-11-29 DIAGNOSIS — I480 Paroxysmal atrial fibrillation: Secondary | ICD-10-CM

## 2015-11-29 LAB — POCT INR: INR: 2.7

## 2015-12-01 ENCOUNTER — Other Ambulatory Visit: Payer: Self-pay

## 2015-12-01 ENCOUNTER — Ambulatory Visit (INDEPENDENT_AMBULATORY_CARE_PROVIDER_SITE_OTHER): Payer: Medicare Other | Admitting: Family Medicine

## 2015-12-01 ENCOUNTER — Encounter: Payer: Self-pay | Admitting: Family Medicine

## 2015-12-01 VITALS — BP 118/78 | HR 99 | Resp 16 | Ht 65.0 in | Wt 157.0 lb

## 2015-12-01 DIAGNOSIS — R7301 Impaired fasting glucose: Secondary | ICD-10-CM | POA: Diagnosis not present

## 2015-12-01 DIAGNOSIS — I10 Essential (primary) hypertension: Secondary | ICD-10-CM

## 2015-12-01 DIAGNOSIS — E785 Hyperlipidemia, unspecified: Secondary | ICD-10-CM | POA: Diagnosis not present

## 2015-12-01 DIAGNOSIS — K219 Gastro-esophageal reflux disease without esophagitis: Secondary | ICD-10-CM

## 2015-12-01 DIAGNOSIS — I480 Paroxysmal atrial fibrillation: Secondary | ICD-10-CM

## 2015-12-01 DIAGNOSIS — E038 Other specified hypothyroidism: Secondary | ICD-10-CM | POA: Diagnosis not present

## 2015-12-01 DIAGNOSIS — J302 Other seasonal allergic rhinitis: Secondary | ICD-10-CM

## 2015-12-01 DIAGNOSIS — E559 Vitamin D deficiency, unspecified: Secondary | ICD-10-CM

## 2015-12-01 MED ORDER — HYDROCHLOROTHIAZIDE 25 MG PO TABS: 25.0000 mg | ORAL_TABLET | Freq: Every day | ORAL | Status: DC

## 2015-12-01 MED ORDER — METOPROLOL TARTRATE 100 MG PO TABS: 100.0000 mg | ORAL_TABLET | Freq: Two times a day (BID) | ORAL | Status: DC

## 2015-12-01 MED ORDER — LEVOTHYROXINE SODIUM 100 MCG PO TABS: 100.0000 ug | ORAL_TABLET | Freq: Every day | ORAL | Status: DC

## 2015-12-01 MED ORDER — LOVASTATIN 40 MG PO TABS: 40.0000 mg | ORAL_TABLET | Freq: Every day | ORAL | Status: DC

## 2015-12-01 NOTE — Progress Notes (Signed)
   Subjective:    Patient ID: [name], female    DOB: 1925-12-17, 82 y.o.   MRN: [id number]  HPI   [name]     MRN: [id number]      DOB: 1926/02/26   HPI [name] is here for follow up and re-evaluation of chronic medical conditions, medication management and review of any available recent lab and radiology data.  Preventive health is updated, specifically  Cancer screening and Immunization.   Questions or concerns regarding consultations or procedures which the PT has had in the interim are  addressed. The PT denies any adverse reactions to current medications since the last visit.  There are no new concerns.  There are no specific complaints   ROS Denies recent fever or chills. Denies sinus pressure, nasal congestion, ear pain or sore throat. Denies chest congestion, productive cough or wheezing. Denies chest pains, palpitations and leg swelling Denies abdominal pain, nausea, vomiting,diarrhea or constipation.   Denies dysuria, frequency, hesitancy or incontinence. Chronic  limitation in mobility. Denies headaches, seizures, numbness, or tingling. Denies depression, anxiety or insomnia. Denies skin break down or rash.   PE  BP 118/78 mmHg  Pulse 99  Resp 16  Ht 5\' 5"  (1.651 m)  Wt 157 lb (71.215 kg)  BMI 26.13 kg/m2  SpO2 97%  Patient alert and oriented and in no cardiopulmonary distress.  HEENT: No facial asymmetry, EOMI,   oropharynx pink and moist.  Neck supple no JVD, no mass.  Chest: Clear to auscultation bilaterally.Decreased air entry  CVS: S1, S2 click, no S3.Regular rate.  ABD: Soft non tender.   Ext: No edema  [id number]  ROM spine, shoulders, hips and knees.  Skin: Intact, no ulcerations or rash noted.  Psych: Good eye contact, normal affect. Memory intact not anxious or depressed appearing.  CNS: CN 2-12 intact, power,  normal throughout.no focal deficits noted.   Assessment & Plan   Essential hypertension Controlled, no  change in medication   Paroxysmal atrial fibrillation Rate controlled wih permanent pacemeaker  Seasonal allergies Increased symptoms with the arrival of Spring but adequately controlled  GERD (gastroesophageal reflux disease) Controlled, no change in medication   Hypothyroidism TSH slightly elevated , no med change. Reviewed with pt the correct way to take the synthroid which she has not been doing unfortunately  Hyperlipidemia Unchnaged, no med change Hyperlipidemia:Low fat diet discussed and encouraged.   Lipid Panel  Lab Results  Component Value Date   CHOL 151 11/25/2015   HDL 43* 11/25/2015   LDLCALC 56 11/25/2015   TRIG 258* 11/25/2015   CHOLHDL 3.5 11/25/2015             Review of Systems     Objective:   Physical Exam        Assessment & Plan:

## 2015-12-01 NOTE — Patient Instructions (Addendum)
End August physical exam , call if you need me [place]  No med changes   Change cooking to less fat  Take thyroid med with water on EMPTY stomach and weight for 1 hr before eating  HBA1C, CBC, chem 7 and eGFr, TSH, Vit D

## 2015-12-02 DIAGNOSIS — R0902 Hypoxemia: Secondary | ICD-10-CM | POA: Diagnosis not present

## 2015-12-05 NOTE — Assessment & Plan Note (Signed)
Unchnaged, no med change Hyperlipidemia:Low fat diet discussed and encouraged.   Lipid Panel  Lab Results  Component Value Date   CHOL 151 11/25/2015   HDL 43* 11/25/2015   LDLCALC 56 11/25/2015   TRIG 258* 11/25/2015   CHOLHDL 3.5 11/25/2015

## 2015-12-05 NOTE — Assessment & Plan Note (Signed)
Controlled, no change in medication  

## 2015-12-05 NOTE — Assessment & Plan Note (Signed)
Rate controlled wih permanent pacemeaker

## 2015-12-05 NOTE — Assessment & Plan Note (Signed)
Increased symptoms with the arrival of Spring but adequately controlled

## 2015-12-05 NOTE — Assessment & Plan Note (Signed)
TSH slightly elevated , no med change. Reviewed with pt the correct way to take the synthroid which she has not been doing unfortunately

## 2015-12-24 ENCOUNTER — Other Ambulatory Visit: Payer: Self-pay | Admitting: Family Medicine

## 2015-12-29 ENCOUNTER — Ambulatory Visit (INDEPENDENT_AMBULATORY_CARE_PROVIDER_SITE_OTHER): Payer: Medicare Other | Admitting: *Deleted

## 2015-12-29 DIAGNOSIS — I480 Paroxysmal atrial fibrillation: Secondary | ICD-10-CM | POA: Diagnosis not present

## 2015-12-29 LAB — POCT INR: INR: 2.8

## 2016-01-01 DIAGNOSIS — R0902 Hypoxemia: Secondary | ICD-10-CM | POA: Diagnosis not present

## 2016-01-22 ENCOUNTER — Other Ambulatory Visit: Payer: Self-pay | Admitting: Internal Medicine

## 2016-02-01 DIAGNOSIS — R0902 Hypoxemia: Secondary | ICD-10-CM | POA: Diagnosis not present

## 2016-02-03 ENCOUNTER — Encounter (INDEPENDENT_AMBULATORY_CARE_PROVIDER_SITE_OTHER): Payer: Medicare Other | Admitting: Ophthalmology

## 2016-02-03 DIAGNOSIS — I10 Essential (primary) hypertension: Secondary | ICD-10-CM

## 2016-02-03 DIAGNOSIS — H353231 Exudative age-related macular degeneration, bilateral, with active choroidal neovascularization: Secondary | ICD-10-CM | POA: Diagnosis not present

## 2016-02-03 DIAGNOSIS — H43813 Vitreous degeneration, bilateral: Secondary | ICD-10-CM

## 2016-02-03 DIAGNOSIS — H35033 Hypertensive retinopathy, bilateral: Secondary | ICD-10-CM

## 2016-02-09 ENCOUNTER — Ambulatory Visit (INDEPENDENT_AMBULATORY_CARE_PROVIDER_SITE_OTHER): Payer: Medicare Other | Admitting: *Deleted

## 2016-02-09 DIAGNOSIS — I480 Paroxysmal atrial fibrillation: Secondary | ICD-10-CM

## 2016-02-09 LAB — POCT INR: INR: 3.2

## 2016-03-02 DIAGNOSIS — R0902 Hypoxemia: Secondary | ICD-10-CM | POA: Diagnosis not present

## 2016-03-06 ENCOUNTER — Encounter: Payer: Self-pay | Admitting: Internal Medicine

## 2016-03-06 ENCOUNTER — Ambulatory Visit (INDEPENDENT_AMBULATORY_CARE_PROVIDER_SITE_OTHER): Payer: Medicare Other | Admitting: Internal Medicine

## 2016-03-06 ENCOUNTER — Ambulatory Visit (INDEPENDENT_AMBULATORY_CARE_PROVIDER_SITE_OTHER): Payer: Medicare Other

## 2016-03-06 VITALS — BP 152/83 | HR 78 | Ht 66.0 in | Wt 154.0 lb

## 2016-03-06 DIAGNOSIS — I495 Sick sinus syndrome: Secondary | ICD-10-CM | POA: Diagnosis not present

## 2016-03-06 DIAGNOSIS — I480 Paroxysmal atrial fibrillation: Secondary | ICD-10-CM

## 2016-03-06 LAB — CUP PACEART INCLINIC DEVICE CHECK
Brady Statistic RA Percent Paced: 42 %
Brady Statistic RV Percent Paced: 49 %
Implantable Lead Implant Date: 20110422
Implantable Lead Location: 753859
Implantable Lead Location: 753860
Lead Channel Impedance Value: 362.5 Ohm
Lead Channel Impedance Value: 400 Ohm
Lead Channel Pacing Threshold Pulse Width: 0.5 ms
Lead Channel Pacing Threshold Pulse Width: 0.5 ms
Lead Channel Sensing Intrinsic Amplitude: 6.9 mV
Lead Channel Setting Pacing Amplitude: 3.5 V
Lead Channel Setting Pacing Pulse Width: 0.5 ms
Lead Channel Setting Sensing Sensitivity: 2 mV
MDC IDC LEAD IMPLANT DT: 20110422
MDC IDC MSMT BATTERY REMAINING LONGEVITY: 3.8
MDC IDC MSMT BATTERY VOLTAGE: 2.68 V
MDC IDC MSMT LEADCHNL RA PACING THRESHOLD AMPLITUDE: 2 V
MDC IDC MSMT LEADCHNL RA SENSING INTR AMPL: 0.6 mV
MDC IDC MSMT LEADCHNL RV PACING THRESHOLD AMPLITUDE: 1 V
MDC IDC MSMT LEADCHNL RV PACING THRESHOLD AMPLITUDE: 1 V
MDC IDC MSMT LEADCHNL RV PACING THRESHOLD PULSEWIDTH: 0.5 ms
MDC IDC PG SERIAL: 7117341
MDC IDC SESS DTM: 20170710120223
MDC IDC SET LEADCHNL RV PACING AMPLITUDE: 2.5 V

## 2016-03-06 LAB — POCT INR: INR: 2

## 2016-03-06 NOTE — Patient Instructions (Addendum)
Your physician wants you to follow-up in: 6 Months with Dr. Lovena Le. You will receive a reminder letter in the mail two months in advance. If you don't receive a letter, please call our office to schedule the follow-up appointment.  Your physician recommends that you schedule a follow-up appointment in: 2 Months with [facility].   Your physician recommends that you continue on your current medications as directed. Please refer to the Current Medication list given to you today.  If you need a refill on your cardiac medications before your next appointment, please call your pharmacy.  Thank you for choosing [facility]!

## 2016-03-06 NOTE — Progress Notes (Signed)
HPI [name] returns today for followup. She is a very pleasant 82 year old woman with a history of symptomatic bradycardia, status post permanent pacemaker insertion, hypertension, and paroxysmal and now mostly chronic atrial fibrillation. In the interim, she has been stable. She denies chest pain but has had some shortness of breath. No syncope. No peripheral edema. Her atrial lead has an elevated pacing threshold which matters less now that she is in atrial fib. She admits to sodium indiscretion.  No Known Allergies   Current Outpatient Prescriptions  Medication Sig Dispense Refill  . Calcium Carb-Cholecalciferol (CALCIUM 500 +D) 500-400 MG-UNIT TABS Take 1 capsule by mouth daily.    Marland Kitchen DIGOX 125 MCG tablet TAKE 1 TABLET BY MOUTH ONCE A DAY. 30 tablet 11  . fluticasone (FLONASE) 50 MCG/ACT nasal spray USE 1 SPRAY IN EACH NOSTRIL DAILY. 16 g 2  . hydrochlorothiazide (HYDRODIURIL) 25 MG tablet Take 1 tablet (25 mg total) by mouth daily. 30 tablet 3  . levothyroxine (SYNTHROID, LEVOTHROID) 100 MCG tablet Take 1 tablet (100 mcg total) by mouth daily. 30 tablet 3  . lovastatin (MEVACOR) 40 MG tablet Take 1 tablet (40 mg total) by mouth daily. 30 tablet 3  . metoprolol (LOPRESSOR) 100 MG tablet Take 1 tablet (100 mg total) by mouth 2 (two) times daily. 60 tablet 3  . omeprazole (PRILOSEC) 40 MG capsule TAKE (1) CAPSULE BY MOUTH ONCE DAILY FOR ACID REFLUX. 30 capsule 4  . OXYGEN-HELIUM IN Inhale 2 L into the lungs at bedtime.    . penicillin v potassium (VEETID) 500 MG tablet Take 1 tablet (500 mg total) by mouth 3 (three) times daily. 21 tablet 0  . Potassium 99 MG TABS Take 1 tablet by mouth daily.    Marland Kitchen warfarin (COUMADIN) 5 MG tablet Take 1/2 tablet daily except 1 tablet on Wednesdays 45 tablet 3   No current facility-administered medications for this visit.     Past Medical History  Diagnosis Date  . Hypertension   . Hyperlipidemia   . Pancreatitis   . Gastroesophageal reflux disease    . Impaired glucose tolerance   . Hypothyroidism   . Macular degeneration   . Sleep apnea     Nocturnal oxygen therapy  . Arthritis   . Sick sinus syndrome ([place]) 2011    Atrial fibrilation 10/2009; and dual chamber pacemaker in 4/11; normal EF  . Paroxysmal atrial fibrillation ([place]) 2011  . Hearing impairment   . Zoster 2016    ROS:   All systems reviewed and negative except as noted in the HPI.   Past Surgical History  Procedure Laterality Date  . Abdominal hysterectomy    . Salpingoophorectomy  1970    right  . Cataract extraction      Bilateral  . Pacemaker insertion  2011    dual chamber   . Colonoscopy  2009    Dr. Gala Romney  . Eye surgery      laser      Family History  Problem Relation Age of Onset  . Cancer Mother     gential  . Cancer Father     throat   . Heart disease    . Arthritis    . Lung disease    . Asthma    . Pneumonia Sister      Social History   Social History  . Marital Status: Widowed    Spouse Name: N/A  . Number of Children: 7  . Years of Education: college  Occupational History  . retired     Social History Main Topics  . Smoking status: Never Smoker   . Smokeless tobacco: Never Used  . Alcohol Use: No  . Drug Use: No  . Sexual Activity: Not on file   Other Topics Concern  . Not on file   Social History Narrative     BP 152/83 mmHg  Pulse 78  Ht 5\' 6"  (1.676 m)  Wt 154 lb (69.854 kg)  BMI 24.87 kg/m2  SpO2 97%  Physical Exam:  Well appearing 82 year old woman, NAD HEENT: Unremarkable Neck:  6 cm JVD, no thyromegally Back:  No CVA tenderness Lungs:  Clear with no wheezes, rales, or rhonchi. HEART:  Regular rate rhythm, no murmurs, no rubs, no clicks Abd:  soft, positive bowel sounds, no organomegally, no rebound, no guarding Ext:  2 plus pulses, no edema, no cyanosis, no clubbing Skin:  No rashes no nodules Neuro:  CN II through XII intact, motor grossly intact  DEVICE  Normal device function.  See  PaceArt for details.   Assess/Plan: 1. Atrial fib - she will continue coumadin. She is asymptomatic. Her rate is controlled. 2. PPM - she is approaching ERI. She will likely need PM gen change in 3 months. 3. Atrial lead dysfunction - she has had an elevated pacing threshold. However, she is now mostly in atrial fib so at this point, I will likely not attempt to place a new atrial lead. 4. HTN - her blood pressure is not well controlled. She notes that he 4 year old great grandson has been behaving badly and gotten her stressed out. ]  Mikle Bosworth.D.

## 2016-04-02 DIAGNOSIS — R0902 Hypoxemia: Secondary | ICD-10-CM | POA: Diagnosis not present

## 2016-04-05 ENCOUNTER — Ambulatory Visit (INDEPENDENT_AMBULATORY_CARE_PROVIDER_SITE_OTHER): Payer: Medicare Other | Admitting: *Deleted

## 2016-04-05 DIAGNOSIS — I480 Paroxysmal atrial fibrillation: Secondary | ICD-10-CM

## 2016-04-05 LAB — POCT INR: INR: 1.6

## 2016-04-14 DIAGNOSIS — E559 Vitamin D deficiency, unspecified: Secondary | ICD-10-CM | POA: Diagnosis not present

## 2016-04-14 DIAGNOSIS — R7301 Impaired fasting glucose: Secondary | ICD-10-CM | POA: Diagnosis not present

## 2016-04-14 DIAGNOSIS — I10 Essential (primary) hypertension: Secondary | ICD-10-CM | POA: Diagnosis not present

## 2016-04-14 LAB — TSH: TSH: 4.55 m[IU]/L — AB

## 2016-04-14 LAB — CBC WITH DIFFERENTIAL/PLATELET
Basophils Absolute: 107 cells/uL (ref 0–200)
Basophils Relative: 1 %
EOS PCT: 1 %
Eosinophils Absolute: 107 cells/uL (ref 15–500)
HEMATOCRIT: 36.2 % (ref 35.0–45.0)
HEMOGLOBIN: 12.4 g/dL (ref 11.7–15.5)
LYMPHS ABS: 2996 {cells}/uL (ref 850–3900)
LYMPHS PCT: 28 %
MCH: 29.2 pg (ref 27.0–33.0)
MCHC: 34.3 g/dL (ref 32.0–36.0)
MCV: 85.2 fL (ref 80.0–100.0)
MPV: 9 fL (ref 7.5–12.5)
Monocytes Absolute: 856 cells/uL (ref 200–950)
Monocytes Relative: 8 %
NEUTROS PCT: 62 %
Neutro Abs: 6634 cells/uL (ref 1500–7800)
Platelets: 317 10*3/uL (ref 140–400)
RBC: 4.25 MIL/uL (ref 3.80–5.10)
RDW: 13.7 % (ref 11.0–15.0)
WBC: 10.7 10*3/uL (ref 3.8–10.8)

## 2016-04-14 LAB — HEMOGLOBIN A1C
Hgb A1c MFr Bld: 5.5 % (ref <5.7)
MEAN PLASMA GLUCOSE: 111 mg/dL

## 2016-04-14 LAB — BASIC METABOLIC PANEL WITH GFR
BUN: 19 mg/dL (ref 7–25)
CO2: 29 mmol/L (ref 20–31)
Calcium: 9.5 mg/dL (ref 8.6–10.4)
Chloride: 97 mmol/L — ABNORMAL LOW (ref 98–110)
Creat: 1.05 mg/dL — ABNORMAL HIGH (ref 0.60–0.88)
GFR, EST AFRICAN AMERICAN: 55 mL/min — AB (ref >=60)
GFR, EST NON AFRICAN AMERICAN: 48 mL/min — AB (ref >=60)
GLUCOSE: 94 mg/dL (ref 65–99)
Potassium: 3.9 mmol/L (ref 3.5–5.3)
SODIUM: 138 mmol/L (ref 135–146)

## 2016-04-15 LAB — VITAMIN D 25 HYDROXY (VIT D DEFICIENCY, FRACTURES): VIT D 25 HYDROXY: 51 ng/mL (ref 30–100)

## 2016-04-17 ENCOUNTER — Other Ambulatory Visit: Payer: Self-pay | Admitting: Family Medicine

## 2016-04-18 ENCOUNTER — Other Ambulatory Visit: Payer: Self-pay | Admitting: Family Medicine

## 2016-04-19 ENCOUNTER — Encounter: Payer: Self-pay | Admitting: Family Medicine

## 2016-04-19 ENCOUNTER — Ambulatory Visit (INDEPENDENT_AMBULATORY_CARE_PROVIDER_SITE_OTHER): Payer: Medicare Other | Admitting: Family Medicine

## 2016-04-19 ENCOUNTER — Ambulatory Visit (INDEPENDENT_AMBULATORY_CARE_PROVIDER_SITE_OTHER): Payer: Medicare Other | Admitting: *Deleted

## 2016-04-19 VITALS — BP 130/80 | HR 68 | Resp 16 | Ht 66.0 in | Wt 154.0 lb

## 2016-04-19 DIAGNOSIS — Z0000 Encounter for general adult medical examination without abnormal findings: Secondary | ICD-10-CM

## 2016-04-19 DIAGNOSIS — Z23 Encounter for immunization: Secondary | ICD-10-CM

## 2016-04-19 DIAGNOSIS — I480 Paroxysmal atrial fibrillation: Secondary | ICD-10-CM | POA: Diagnosis not present

## 2016-04-19 LAB — POCT INR: INR: 1.8

## 2016-04-19 NOTE — Progress Notes (Signed)
         Medicare annual wellness visit, subsequent Annual exam as documented. Counseling done  re healthy lifestyle involving commitment to 150 minutes exercise per week, heart healthy diet, and attaining healthy weight.The importance of adequate sleep also discussed. Regular seat belt use and home safety, is also discussed. Changes in health habits are decided on by the patient with goals and time frames  set for achieving them. Immunization and cancer screening needs are specifically addressed at this visit.   Need for prophylactic vaccination and inoculation against influenza After obtaining informed consent, the vaccine is  administered by LPN.

## 2016-04-19 NOTE — Progress Notes (Signed)
Subjective:    [name] is a 82 y.o. female who presents for Medicare Annual/Subsequent preventive examination.  Preventive Screening-Counseling & Management  Tobacco History  Smoking Status  . Never Smoker  Smokeless Tobacco  . Never Used     Current Problems (verified) Patient Active Problem List   Diagnosis Date Noted  . Falls frequently 10/21/2015  . Seasonal allergies 05/05/2014  . GERD (gastroesophageal reflux disease) 05/05/2014  . Sick sinus syndrome ([place])   . Chronic anticoagulation 11/18/2010  . Impaired fasting glucose 10/14/2010  . Paroxysmal atrial fibrillation ([place]) 06/14/2010  . 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 Hypothyroidism 02/25/2008  . Hyperlipidemia 02/25/2008  . HEARING LOSS 02/25/2008  . Essential hypertension 02/25/2008    Medications Prior to Visit Current Outpatient Prescriptions on File Prior to Visit  Medication Sig Dispense Refill  . Calcium Carb-Cholecalciferol (CALCIUM 500 +D) 500-400 MG-UNIT TABS Take 1 capsule by mouth daily.    Marland Kitchen DIGOX 125 MCG tablet TAKE 1 TABLET BY MOUTH ONCE A DAY. 30 tablet 11  . fluticasone (FLONASE) 50 MCG/ACT nasal spray USE 1 SPRAY IN EACH NOSTRIL DAILY. 16 g 2  . hydrochlorothiazide (HYDRODIURIL) 25 MG tablet Take 1 tablet (25 mg total) by mouth daily. 30 tablet 3  . levothyroxine (SYNTHROID, LEVOTHROID) 100 MCG tablet Take 1 tablet (100 mcg total) by mouth daily. 30 tablet 3  . lovastatin (MEVACOR) 40 MG tablet TAKE ONE TABLET BY MOUTH DAILY. 30 tablet 3  . metoprolol (LOPRESSOR) 100 MG tablet Take 1 tablet (100 mg total) by mouth 2 (two) times daily. 60 tablet 3  . omeprazole (PRILOSEC) 40 MG capsule TAKE (1) CAPSULE BY MOUTH ONCE DAILY FOR ACID REFLUX. 30 capsule 4  . OXYGEN-HELIUM IN Inhale 2 L into the lungs at bedtime.    . Potassium 99 MG TABS Take 1 tablet by mouth daily.    Marland Kitchen warfarin (COUMADIN) 5 MG tablet Take 1/2 tablet daily except 1 tablet on Wednesdays 45 tablet 3   No current facility-administered  medications on file prior to visit.     Current Medications (verified) Current Outpatient Prescriptions  Medication Sig Dispense Refill  . Calcium Carb-Cholecalciferol (CALCIUM 500 +D) 500-400 MG-UNIT TABS Take 1 capsule by mouth daily.    Marland Kitchen DIGOX 125 MCG tablet TAKE 1 TABLET BY MOUTH ONCE A DAY. 30 tablet 11  . fluticasone (FLONASE) 50 MCG/ACT nasal spray USE 1 SPRAY IN EACH NOSTRIL DAILY. 16 g 2  . hydrochlorothiazide (HYDRODIURIL) 25 MG tablet Take 1 tablet (25 mg total) by mouth daily. 30 tablet 3  . levothyroxine (SYNTHROID, LEVOTHROID) 100 MCG tablet Take 1 tablet (100 mcg total) by mouth daily. 30 tablet 3  . lovastatin (MEVACOR) 40 MG tablet TAKE ONE TABLET BY MOUTH DAILY. 30 tablet 3  . metoprolol (LOPRESSOR) 100 MG tablet Take 1 tablet (100 mg total) by mouth 2 (two) times daily. 60 tablet 3  . omeprazole (PRILOSEC) 40 MG capsule TAKE (1) CAPSULE BY MOUTH ONCE DAILY FOR ACID REFLUX. 30 capsule 4  . OXYGEN-HELIUM IN Inhale 2 L into the lungs at bedtime.    . Potassium 99 MG TABS Take 1 tablet by mouth daily.    Marland Kitchen warfarin (COUMADIN) 5 MG tablet Take 1/2 tablet daily except 1 tablet on Wednesdays 45 tablet 3   No current facility-administered medications for this visit.      Allergies (verified) Review of patient's allergies indicates no known allergies.   PAST HISTORY  Family History Family History  Problem Relation Age of  Onset  . Cancer Mother     gential  . Cancer Father     throat   . Pneumonia Sister   . Heart disease    . Arthritis    . Lung disease    . Asthma      Social History Social History  Substance Use Topics  . Smoking status: Never Smoker  . Smokeless tobacco: Never Used  . Alcohol use No     Are there smokers in your home (other than you)? Yes her daughter smokes outside but does smoke in her car with her   Risk Factors Current exercise habits: Exercise is limited by instability. Very unsteady but she does use a cane to walk around her yard  for exercise  Dietary issues discussed: heart healthy diet encouraged. Doesn't eat fried foods and eats variety of fruits and vegetables    Cardiac risk factors: advanced age (older than 80 for men, 96 for women), dyslipidemia, hypertension and smoking/ tobacco exposure.  Depression Screen (Note: if answer to either of the following is "Yes", a more complete depression screening is indicated)   Over the past two weeks, have you felt down, depressed or hopeless? No  Over the past two weeks, have you felt little interest or pleasure in doing things? No  Have you lost interest or pleasure in daily life? No  Do you often feel hopeless? No  Do you cry easily over simple problems? No  Activities of Daily Living In your present state of health, do you have any difficulty performing the following activities?:  Driving? Yes, daughter and granddaughter provides transport Managing money?  Yes Feeding yourself? Yes Getting from bed to chair? No Climbing a flight of stairs? Yes, does use stairs at her home but holds the railing for support  Preparing food and eating?: Yes, family prepares for her  Bathing or showering? No Getting dressed: No Getting to the toilet? No Using the toilet:No Moving around from place to place: Yes, uses cane and seems to be unsteady at nights when she tries to move to fast  In the past year have you fallen or had a near fall?:Yes, once   Are you sexually active?  No  Do you have more than one partner?  No  Hearing Difficulties: Yes Do you often ask people to speak up or repeat themselves? Yes Do you experience ringing or noises in your ears? No Do you have difficulty understanding soft or whispered voices? Yes   Do you feel that you have a problem with memory? No  Do you often misplace items? Yes, once in awhile  Do you feel safe at home?  Yes  Cognitive Testing  Alert? Yes  Normal Appearance?Yes  Oriented to person? Yes  Place? Yes   Time? Yes  Recall of  three objects?  Yes  Can perform simple calculations? Yes  Displays appropriate judgment?Yes  Can read the correct time from a watch face?Yes   Advanced Directives have been discussed with the patient? Yes  List the Names of Other Physician/Practitioners you currently use: 1.  Sinda Du (Pulmonology)  2.  Cristopher Peru  (Cardiology) 3.  Tempie Hoist ( Opthalmology)   Indicate any recent Medical Services you may have received from other than [facility] providers in the past year (date may be approximate).  Immunization History  Administered Date(s) Administered  . H1N1 07/01/2008  . Influenza Split 06/14/2012  . Influenza Whole 06/22/2006, 05/27/2008, 06/10/2009, 06/09/2010, 05/10/2011  . Influenza,inj,Quad PF,36+ Mos 06/09/2013,  08/12/2014, 07/15/2015, 04/19/2016  . Pneumococcal Conjugate-13 03/18/2014  . Pneumococcal Polysaccharide-23 06/09/2008  . Tdap 04/22/2015    Screening Tests Health Maintenance  Topic Date Due  . INFLUENZA VACCINE  03/28/2016  . ZOSTAVAX  09/13/2016 (Originally 10/20/1987)  . TETANUS/TDAP  04/21/2025  . DEXA SCAN  Completed  . PNA vac Low Risk Adult  Completed    All answers were reviewed with the patient and necessary referrals were made:  Tula Nakayama, MD   04/19/2016   History reviewed: allergies, current medications, past family history, past medical history, past social history, past surgical history and problem list  Review of Systems A comprehensive review of systems was negative.    Objective:     Vision by Snellen chart: right eye:20/200, left eye:20/70  Body mass index is 24.86 kg/m. BP 130/80   Pulse 68   Resp 16   Ht 5\' 6"  (1.676 m)   Wt 154 lb (69.9 kg)   SpO2 98%   BMI 24.86 kg/m   No exam performed today, annual wellness visit without physical exam .     Assessment:     Plan:     During the course of the visit the patient was educated and counseled about appropriate screening and preventive services  including:    Advanced directives: has NO advanced directive  - add't info requested. Referral to SW: not applicable   Screening plan  Diet review for nutrition referral? Yes ____  Not Indicated _x___   Patient Instructions (the written plan) was given to the patient.  Medicare Attestation I have personally reviewed: The patient's medical and social history Their use of alcohol, tobacco or illicit drugs Their current medications and supplements The patient's functional ability including ADLs,fall risks, home safety risks, cognitive, and hearing and visual impairment Diet and physical activities Evidence for depression or mood disorders  The patient's weight, height, BMI, and visual acuity have been recorded in the chart.  I have made referrals, counseling, and provided education to the patient based on review of the above and I have provided the patient with a written personalized care plan for preventive services.     Tula Nakayama, MD   04/19/2016

## 2016-04-19 NOTE — Patient Instructions (Signed)
Thank you for choosing [facility] for your healthcare needs.  It was a pleasure seeing you for your Annual Wellness Visit today  Dr. Moshe Cipro will see you back in 3 months for your physical  Lab order will be mailed to you closer to your appointment date to have done 1 week before your appointment.

## 2016-04-21 ENCOUNTER — Encounter: Payer: Self-pay | Admitting: Family Medicine

## 2016-04-21 NOTE — Assessment & Plan Note (Signed)
After obtaining informed consent, the vaccine is  administered by LPN.  

## 2016-04-21 NOTE — Assessment & Plan Note (Signed)

## 2016-04-24 ENCOUNTER — Other Ambulatory Visit: Payer: Self-pay | Admitting: Family Medicine

## 2016-05-03 DIAGNOSIS — R0902 Hypoxemia: Secondary | ICD-10-CM | POA: Diagnosis not present

## 2016-05-08 ENCOUNTER — Ambulatory Visit (INDEPENDENT_AMBULATORY_CARE_PROVIDER_SITE_OTHER): Payer: Medicare Other | Admitting: *Deleted

## 2016-05-08 DIAGNOSIS — I480 Paroxysmal atrial fibrillation: Secondary | ICD-10-CM

## 2016-05-08 LAB — POCT INR: INR: 3.4

## 2016-05-19 ENCOUNTER — Ambulatory Visit (INDEPENDENT_AMBULATORY_CARE_PROVIDER_SITE_OTHER): Payer: Medicare Other | Admitting: *Deleted

## 2016-05-19 DIAGNOSIS — I495 Sick sinus syndrome: Secondary | ICD-10-CM

## 2016-05-19 DIAGNOSIS — I480 Paroxysmal atrial fibrillation: Secondary | ICD-10-CM

## 2016-05-19 LAB — CUP PACEART INCLINIC DEVICE CHECK
Brady Statistic RA Percent Paced: 1 % — CL
Brady Statistic RV Percent Paced: 74 %
Implantable Lead Implant Date: 20110422
Implantable Lead Location: 753859
Lead Channel Impedance Value: 360 Ohm
Lead Channel Setting Pacing Amplitude: 2.5 V
Lead Channel Setting Pacing Amplitude: 3.5 V
Lead Channel Setting Pacing Pulse Width: 0.5 ms
MDC IDC LEAD IMPLANT DT: 20110422
MDC IDC LEAD LOCATION: 753860
MDC IDC MSMT BATTERY REMAINING LONGEVITY: 4 mo
MDC IDC MSMT LEADCHNL RA SENSING INTR AMPL: 0.4 mV
MDC IDC MSMT LEADCHNL RV IMPEDANCE VALUE: 430 Ohm
MDC IDC MSMT LEADCHNL RV PACING THRESHOLD AMPLITUDE: 1 V
MDC IDC MSMT LEADCHNL RV PACING THRESHOLD PULSEWIDTH: 0.5 ms
MDC IDC MSMT LEADCHNL RV SENSING INTR AMPL: 3.6 mV
MDC IDC SESS DTM: 20170922171916
MDC IDC SET LEADCHNL RV SENSING SENSITIVITY: 2 mV
Pulse Gen Model: 2110
Pulse Gen Serial Number: 7117341

## 2016-05-19 NOTE — Progress Notes (Signed)
Pacemaker check in clinic for battery only. RV threshold and impedances consistent with previous measurements. Gradual decline in R wave amplitude noted--sensitivity increased to 1.68mV. Device programmed to maximize longevity. Persistent AF + warfarin. No high ventricular rates noted. Estimated longevity 3.3-7.8 months. Patient will follow up with Device clinic/R in 2 months.

## 2016-05-22 ENCOUNTER — Ambulatory Visit (INDEPENDENT_AMBULATORY_CARE_PROVIDER_SITE_OTHER): Payer: Medicare Other | Admitting: *Deleted

## 2016-05-22 DIAGNOSIS — I480 Paroxysmal atrial fibrillation: Secondary | ICD-10-CM

## 2016-05-22 LAB — POCT INR: INR: 2.7

## 2016-06-01 ENCOUNTER — Encounter (INDEPENDENT_AMBULATORY_CARE_PROVIDER_SITE_OTHER): Payer: Self-pay | Admitting: Ophthalmology

## 2016-06-02 DIAGNOSIS — R0902 Hypoxemia: Secondary | ICD-10-CM | POA: Diagnosis not present

## 2016-06-14 ENCOUNTER — Ambulatory Visit (INDEPENDENT_AMBULATORY_CARE_PROVIDER_SITE_OTHER): Payer: Medicare Other | Admitting: *Deleted

## 2016-06-14 DIAGNOSIS — I480 Paroxysmal atrial fibrillation: Secondary | ICD-10-CM

## 2016-06-14 LAB — POCT INR: INR: 2.8

## 2016-06-21 ENCOUNTER — Encounter (INDEPENDENT_AMBULATORY_CARE_PROVIDER_SITE_OTHER): Payer: Medicare Other | Admitting: Ophthalmology

## 2016-06-21 DIAGNOSIS — H353231 Exudative age-related macular degeneration, bilateral, with active choroidal neovascularization: Secondary | ICD-10-CM | POA: Diagnosis not present

## 2016-06-21 DIAGNOSIS — I10 Essential (primary) hypertension: Secondary | ICD-10-CM

## 2016-06-21 DIAGNOSIS — H35033 Hypertensive retinopathy, bilateral: Secondary | ICD-10-CM

## 2016-06-21 DIAGNOSIS — H43813 Vitreous degeneration, bilateral: Secondary | ICD-10-CM

## 2016-06-23 ENCOUNTER — Other Ambulatory Visit: Payer: Self-pay | Admitting: Family Medicine

## 2016-07-03 DIAGNOSIS — R0902 Hypoxemia: Secondary | ICD-10-CM | POA: Diagnosis not present

## 2016-07-12 ENCOUNTER — Ambulatory Visit (INDEPENDENT_AMBULATORY_CARE_PROVIDER_SITE_OTHER): Payer: Medicare Other | Admitting: *Deleted

## 2016-07-12 DIAGNOSIS — I480 Paroxysmal atrial fibrillation: Secondary | ICD-10-CM

## 2016-07-12 LAB — POCT INR: INR: 2.6

## 2016-07-22 ENCOUNTER — Other Ambulatory Visit: Payer: Self-pay | Admitting: Family Medicine

## 2016-07-24 ENCOUNTER — Telehealth: Payer: Self-pay | Admitting: Family Medicine

## 2016-07-24 DIAGNOSIS — E559 Vitamin D deficiency, unspecified: Secondary | ICD-10-CM | POA: Diagnosis not present

## 2016-07-24 DIAGNOSIS — E038 Other specified hypothyroidism: Secondary | ICD-10-CM

## 2016-07-24 DIAGNOSIS — E782 Mixed hyperlipidemia: Secondary | ICD-10-CM | POA: Diagnosis not present

## 2016-07-24 DIAGNOSIS — R7301 Impaired fasting glucose: Secondary | ICD-10-CM

## 2016-07-24 DIAGNOSIS — I10 Essential (primary) hypertension: Secondary | ICD-10-CM

## 2016-07-24 NOTE — Telephone Encounter (Signed)
Patient aware that labs have been placed 

## 2016-07-24 NOTE — Telephone Encounter (Signed)
Needs tSH,  Fasting lipid and chem 7 and vit D, pls order and let her know

## 2016-07-24 NOTE — Telephone Encounter (Signed)
Does patient need any labs before upcoming appt?

## 2016-07-24 NOTE — Telephone Encounter (Signed)
[name] is calling stating that she needs her lab orders sent to the lab, please advise?

## 2016-07-25 LAB — LIPID PANEL
CHOL/HDL RATIO: 3.4 ratio (ref <5.0)
CHOLESTEROL: 137 mg/dL (ref <200)
HDL: 40 mg/dL — ABNORMAL LOW (ref >50)
LDL Cholesterol: 51 mg/dL (ref <100)
TRIGLYCERIDES: 232 mg/dL — AB (ref <150)
VLDL: 46 mg/dL — AB (ref <30)

## 2016-07-25 LAB — BASIC METABOLIC PANEL
BUN: 12 mg/dL (ref 7–25)
CO2: 27 mmol/L (ref 20–31)
CREATININE: 1.02 mg/dL — AB (ref 0.60–0.88)
Calcium: 9.8 mg/dL (ref 8.6–10.4)
Chloride: 98 mmol/L (ref 98–110)
GLUCOSE: 103 mg/dL — AB (ref 65–99)
POTASSIUM: 3.6 mmol/L (ref 3.5–5.3)
Sodium: 138 mmol/L (ref 135–146)

## 2016-07-25 LAB — TSH: TSH: 5.48 mIU/L — ABNORMAL HIGH

## 2016-07-26 LAB — VITAMIN D 25 HYDROXY (VIT D DEFICIENCY, FRACTURES): VIT D 25 HYDROXY: 46 ng/mL (ref 30–100)

## 2016-07-27 ENCOUNTER — Encounter: Payer: Self-pay | Admitting: Family Medicine

## 2016-07-27 ENCOUNTER — Ambulatory Visit (INDEPENDENT_AMBULATORY_CARE_PROVIDER_SITE_OTHER): Payer: Medicare Other | Admitting: Family Medicine

## 2016-07-27 ENCOUNTER — Other Ambulatory Visit (HOSPITAL_COMMUNITY)
Admission: RE | Admit: 2016-07-27 | Discharge: 2016-07-27 | Disposition: A | Discharge status: Home / Self Care | Payer: Medicare Other | Source: Ambulatory Visit | Attending: Family Medicine | Admitting: Family Medicine

## 2016-07-27 VITALS — BP 122/80 | HR 101 | Resp 16 | Ht 66.0 in | Wt 154.0 lb

## 2016-07-27 DIAGNOSIS — Z124 Encounter for screening for malignant neoplasm of cervix: Secondary | ICD-10-CM | POA: Diagnosis not present

## 2016-07-27 DIAGNOSIS — E785 Hyperlipidemia, unspecified: Secondary | ICD-10-CM

## 2016-07-27 DIAGNOSIS — Z0000 Encounter for general adult medical examination without abnormal findings: Secondary | ICD-10-CM

## 2016-07-27 DIAGNOSIS — Z1211 Encounter for screening for malignant neoplasm of colon: Secondary | ICD-10-CM | POA: Diagnosis not present

## 2016-07-27 DIAGNOSIS — R296 Repeated falls: Secondary | ICD-10-CM

## 2016-07-27 DIAGNOSIS — E038 Other specified hypothyroidism: Secondary | ICD-10-CM

## 2016-07-27 DIAGNOSIS — Z13820 Encounter for screening for osteoporosis: Secondary | ICD-10-CM

## 2016-07-27 LAB — POC HEMOCCULT BLD/STL (OFFICE/1-CARD/DIAGNOSTIC): FECAL OCCULT BLD: NEGATIVE

## 2016-07-27 MED ORDER — LEVOTHYROXINE SODIUM 112 MCG PO TABS: 112.0000 ug | ORAL_TABLET | Freq: Every day | ORAL | 5 refills | Status: DC

## 2016-07-27 NOTE — Patient Instructions (Addendum)
F/u in 4 month, call if you need me soonmer  TG are improved, congrats.  You are referred for bone density test and to physical therapy for 4 weeks.VERY important to protect bones and reduce falls  INCREASED dose of synthroid to 112 mcg once daily, start when you next fill script when due in December  Fasting lipid cmp , tSH in 4 .5 months  Do NOT cut toenails, call for foot Doc to cut when ready, they re thick and you may accidentally cut  Your skin and end up with a bad infection, which could become life threatening  All the best!  ,ty1

## 2016-07-30 NOTE — Assessment & Plan Note (Signed)
Recurrent falls and unsteady gait, refer to PT twice weekly for 4 weeks

## 2016-07-30 NOTE — Progress Notes (Signed)
    [name]     MRN: [id number]      DOB: 1925/10/24  HPI: Patient is in for annual physical exam. C/o unsteady gait and recurrent near falls, agrees to needed  therapy Recent labs, if available are reviewed.Dose of synthroifd increased as persistently subtherapeutic  Immunization is reviewed , and  updated if needed.   PE: Pleasant  female, alert and oriented x 3, in no cardio-pulmonary distress. Afebrile. HEENT No facial trauma or asymetry. Sinuses non tender.  Extra occullar muscles intact External ears normal, tympanic membranes clear. Oropharynx moist, no exudate. Neck: supple, no adenopathy,JVD or thyromegaly.No bruits.  Chest: Clear to ascultation bilaterally.No crackles or wheezes.Decreased though adequate air entry Non tender to palpation Pacemaker in left anterior chest Breast: No asymetry,no masses or lumps. No tenderness. No nipple discharge or inversion. No axillary or supraclavicular adenopathy  Cardiovascular system; Heart sounds normal,  S1 and  S2 ,no S3.   murmur, or thrill. Apical beat not displaced Peripheral pulses normal.  Abdomen: Soft, non tender, no organomegaly or masses. No bruits. Bowel sounds normal. No guarding, tenderness or rebound.  Rectal:  Normal sphincter tone. No rectal mass. Guaiac negative stool.  GU: External genitalia normal female genitalia , normal female distribution of hair. No lesions. Urethral meatus normal in size, no  Prolapse, no lesions visibly  Present. Bladder non tender. Vagina pink and moist , with no visible lesions , discharge present . Adequate pelvic support no  cystocele or rectocele noted  Uterus absent, no adnexal masses, no  adnexal tenderness.   Musculoskeletal exam: Decreased though adequate ROM of spine, hips , shoulders and knees.Deformity and crepitus noted in knees No muscle wasting or atrophy.   Neurologic: Cranial nerves 2 to 12 intact. Power, tone ,sensation and reflexes normal  throughout. disturbance in gait. No tremor.  Skin: Intact, bruise noted under left great toe where she actually traumatized herself attempting to cut nails Right great toenail long and curving into skin and thick Pigmentation normal throughout  Psych; Normal mood and affect. Judgement and concentration normal   Assessment & Plan:  Annual physical exam Annual exam as documented.  Immunization and cancer screening needs are specifically addressed at this visit.   Falls frequently Recurrent falls and unsteady gait, refer to PT twice weekly for 4 weeks  Hypothyroidism Uncorrected, increase dose of synthroid, reviewed taking on empty stomach, rept lab in 4 months

## 2016-07-30 NOTE — Assessment & Plan Note (Signed)
Uncorrected, increase dose of synthroid, reviewed taking on empty stomach, rept lab in 4 months

## 2016-07-30 NOTE — Assessment & Plan Note (Addendum)
Annual exam as documented. . Immunization and cancer screening needs are specifically addressed at this visit.  

## 2016-07-31 ENCOUNTER — Other Ambulatory Visit: Payer: Self-pay | Admitting: Family Medicine

## 2016-07-31 DIAGNOSIS — Z1231 Encounter for screening mammogram for malignant neoplasm of breast: Secondary | ICD-10-CM

## 2016-07-31 LAB — CYTOLOGY - PAP: DIAGNOSIS: NEGATIVE

## 2016-08-01 ENCOUNTER — Encounter: Payer: Self-pay | Admitting: Cardiology

## 2016-08-01 ENCOUNTER — Ambulatory Visit (INDEPENDENT_AMBULATORY_CARE_PROVIDER_SITE_OTHER): Payer: Medicare Other | Admitting: Cardiology

## 2016-08-01 VITALS — BP 130/70 | HR 92 | Wt 154.0 lb

## 2016-08-01 DIAGNOSIS — R001 Bradycardia, unspecified: Secondary | ICD-10-CM

## 2016-08-01 DIAGNOSIS — E782 Mixed hyperlipidemia: Secondary | ICD-10-CM | POA: Diagnosis not present

## 2016-08-01 DIAGNOSIS — I480 Paroxysmal atrial fibrillation: Secondary | ICD-10-CM | POA: Diagnosis not present

## 2016-08-01 DIAGNOSIS — I10 Essential (primary) hypertension: Secondary | ICD-10-CM | POA: Diagnosis not present

## 2016-08-01 NOTE — Progress Notes (Signed)
Clinical Summary Ms. Gharibian is a 82 y.o.female seen today for follow up of the following medical problems   1. Paroxysmal afib - just occasional palpitations, 1-2 times a month. Lasts just few seconds.  - no bleeding troubles on coumadin. Has not been interested in NOACs.    2. Symptomatic bradycardia - pacemaker with normal function at last device check 04/2016.  - denies any lightheadness, no dizziness since last visit  3. HTN - compliant with meds  4. Hyperlipidemia - compliant with statin - She endorses eating a lot of sweets and pastries. 06/2016 TC 137 TG 232 LDL 51 HDL 40  Past Medical History:  Diagnosis Date  . Arthritis   . Gastroesophageal reflux disease   . Hearing impairment   . Hyperlipidemia   . Hypertension   . Hypothyroidism   . Impaired glucose tolerance   . Macular degeneration   . Pancreatitis   . Paroxysmal atrial fibrillation ([place]) 2011  . Sick sinus syndrome ([place]) 2011   Atrial fibrilation 10/2009; and dual chamber pacemaker in 4/11; normal EF  . Sleep apnea    Nocturnal oxygen therapy  . Zoster 2016     No Known Allergies   Current Outpatient Prescriptions  Medication Sig Dispense Refill  . Calcium Carb-Cholecalciferol (CALCIUM 500 +D) 500-400 MG-UNIT TABS Take 1 capsule by mouth daily.    Marland Kitchen DIGOX 125 MCG tablet TAKE 1 TABLET BY MOUTH ONCE A DAY. 30 tablet 11  . fluticasone (FLONASE) 50 MCG/ACT nasal spray USE 1 SPRAY IN EACH NOSTRIL DAILY. 16 g 2  . hydrochlorothiazide (HYDRODIURIL) 25 MG tablet TAKE 1 TABLET BY MOUTH ONCE DAILY. 30 tablet 2  . [START ON 08/11/2016] levothyroxine (SYNTHROID, LEVOTHROID) 112 MCG tablet Take 1 tablet (112 mcg total) by mouth daily. 30 tablet 5  . lovastatin (MEVACOR) 40 MG tablet TAKE ONE TABLET BY MOUTH DAILY. 30 tablet 2  . metoprolol (LOPRESSOR) 100 MG tablet TAKE 1 TABLET BY MOUTH TWICE A DAY. 60 tablet 2  . omeprazole (PRILOSEC) 40 MG capsule TAKE (1) CAPSULE BY MOUTH ONCE DAILY FOR ACID  REFLUX. 30 capsule 3  . OXYGEN-HELIUM IN Inhale 2 L into the lungs at bedtime.    . Potassium 99 MG TABS Take 1 tablet by mouth daily.    Marland Kitchen warfarin (COUMADIN) 5 MG tablet Take 1/2 tablet daily except 1 tablet on Wednesdays 45 tablet 3   No current facility-administered medications for this visit.      Past Surgical History:  Procedure Laterality Date  . ABDOMINAL HYSTERECTOMY    . CATARACT EXTRACTION     Bilateral  . COLONOSCOPY  2009   Dr. Gala Romney  . EYE SURGERY     laser   . PACEMAKER INSERTION  2011   dual chamber   . SALPINGOOPHORECTOMY  1970   right     No Known Allergies    Family History  Problem Relation Age of Onset  . Cancer Mother     gential  . Cancer Father     throat   . Pneumonia Sister   . Heart disease    . Arthritis    . Lung disease    . Asthma       Social History Ms. Maes reports that she has never smoked. She has never used smokeless tobacco. Ms. Sausedo reports that she does not drink alcohol.   Review of Systems CONSTITUTIONAL: No weight loss, fever, chills, weakness or fatigue.  HEENT: Eyes: No visual loss, blurred  vision, double vision or yellow sclerae.No hearing loss, sneezing, congestion, runny nose or sore throat.  SKIN: No rash or itching.  CARDIOVASCULAR: per HPI RESPIRATORY: No shortness of breath, cough or sputum.  GASTROINTESTINAL: No anorexia, nausea, vomiting or diarrhea. No abdominal pain or blood.  GENITOURINARY: No burning on urination, no polyuria NEUROLOGICAL: No headache, dizziness, syncope, paralysis, ataxia, numbness or tingling in the extremities. No change in bowel or bladder control.  MUSCULOSKELETAL: No muscle, back pain, joint pain or stiffness.  LYMPHATICS: No enlarged nodes. No history of splenectomy.  PSYCHIATRIC: No history of depression or anxiety.  ENDOCRINOLOGIC: No reports of sweating, cold or heat intolerance. No polyuria or polydipsia.  Marland Kitchen   Physical Examination Vitals:   08/01/16 1016  BP:  130/70  Pulse: 92   Vitals:   08/01/16 1016  Weight: 154 lb (69.9 kg)    Gen: resting comfortably, no acute distress HEENT: no scleral icterus, pupils equal round and reactive, no palptable cervical adenopathy,  CV: irreg, no m/r/g, no jvd Resp: Clear to auscultation bilaterally GI: abdomen is soft, non-tender, non-distended, normal bowel sounds, no hepatosplenomegaly MSK: extremities are warm, no edema.  Skin: warm, no rash Neuro:  no focal deficits Psych: appropriate affect      Assessment and Plan   1. Afib - symptoms overall controlled, continue current meds. EKG in clinic shows afib rate controlled, intermittent V-pacing - CHADS2Vasc score is 4, continue anticoag  2. HTN - she is at goal, continue current meds.   3. HL - LDL at goal, discussed dietary changes to improve TGs - continue statin  4. Symptomatic bradycardia - normal pacemaker function on last check - continue to monitor     Arnoldo Lenis, M.D.

## 2016-08-01 NOTE — Patient Instructions (Signed)
Your physician wants you to follow-up in: 1 year with Dr. Branch. You will receive a reminder letter in the mail two months in advance. If you don't receive a letter, please call our office to schedule the follow-up appointment.  Your physician recommends that you continue on your current medications as directed. Please refer to the Current Medication list given to you today.  If you need a refill on your cardiac medications before your next appointment, please call your pharmacy.  Thank you for choosing [facility]!   

## 2016-08-02 DIAGNOSIS — R0902 Hypoxemia: Secondary | ICD-10-CM | POA: Diagnosis not present

## 2016-08-03 ENCOUNTER — Ambulatory Visit (INDEPENDENT_AMBULATORY_CARE_PROVIDER_SITE_OTHER): Payer: Medicare Other | Admitting: *Deleted

## 2016-08-03 DIAGNOSIS — I4891 Unspecified atrial fibrillation: Secondary | ICD-10-CM | POA: Diagnosis not present

## 2016-08-03 DIAGNOSIS — Z950 Presence of cardiac pacemaker: Secondary | ICD-10-CM

## 2016-08-03 LAB — CUP PACEART INCLINIC DEVICE CHECK
Brady Statistic RV Percent Paced: 82 %
Date Time Interrogation Session: 20171207170859
Implantable Lead Implant Date: 20110422
Implantable Lead Location: 753860
Implantable Pulse Generator Implant Date: 20110422
Lead Channel Impedance Value: 400 Ohm
Lead Channel Pacing Threshold Amplitude: 0.75 V
Lead Channel Pacing Threshold Pulse Width: 0.5 ms
Lead Channel Sensing Intrinsic Amplitude: 4.9 mV
Lead Channel Setting Sensing Sensitivity: 1.5 mV
MDC IDC LEAD IMPLANT DT: 20110422
MDC IDC LEAD LOCATION: 753859
MDC IDC MSMT BATTERY VOLTAGE: 2.62 V
MDC IDC MSMT LEADCHNL RA IMPEDANCE VALUE: 362.5 Ohm
MDC IDC MSMT LEADCHNL RA SENSING INTR AMPL: 0.3 mV
MDC IDC PG SERIAL: 7117341
MDC IDC SET LEADCHNL RA PACING AMPLITUDE: 3.5 V
MDC IDC SET LEADCHNL RV PACING AMPLITUDE: 2.5 V
MDC IDC SET LEADCHNL RV PACING PULSEWIDTH: 0.5 ms
MDC IDC STAT BRADY RA PERCENT PACED: 0.89 %
Pulse Gen Model: 2110

## 2016-08-03 NOTE — Progress Notes (Signed)
Pacemaker check in clinic. Normal device function. Thresholds, sensing, impedances consistent with previous measurements. Device programmed to maximize longevity. 1 mode switch (100%), AF +warfarin. RA sensitivity increased to 0.53mV due to undersensed AF. No high ventricular rates noted. Device programmed at appropriate safety margins. Histogram distribution appropriate for patient activity level. Device programmed to optimize intrinsic conduction. ERI in <3 months. Patient and family education completed. ROV with GT/R on 09/28/16 as scheduled.

## 2016-08-04 ENCOUNTER — Other Ambulatory Visit (HOSPITAL_COMMUNITY): Payer: Self-pay

## 2016-08-09 ENCOUNTER — Other Ambulatory Visit (HOSPITAL_COMMUNITY): Payer: Self-pay

## 2016-08-09 ENCOUNTER — Telehealth: Payer: Self-pay | Admitting: *Deleted

## 2016-08-09 NOTE — Telephone Encounter (Signed)
Spoke with patient's daughter, [name].  Advised that I reviewed the PPM interrogation from 12/7 with Gaspar Bidding, [employer] rep, and he recommended a f/u appointment sooner than 09/28/16 to discuss generator change.  [name] is agreeable to appointment on 09/11/16 at 10:15am with Dr. Lovena Le in [place].  She is aware that I will cancel the previously scheduled 09/28/16 appointment.  [name] is appreciative of call and denies additional questions or concerns at this time.

## 2016-08-23 ENCOUNTER — Ambulatory Visit (INDEPENDENT_AMBULATORY_CARE_PROVIDER_SITE_OTHER): Payer: Medicare Other | Admitting: *Deleted

## 2016-08-23 DIAGNOSIS — I480 Paroxysmal atrial fibrillation: Secondary | ICD-10-CM | POA: Diagnosis not present

## 2016-08-23 LAB — POCT INR: INR: 3.2

## 2016-08-24 ENCOUNTER — Other Ambulatory Visit: Payer: Self-pay | Admitting: Family Medicine

## 2016-08-31 ENCOUNTER — Other Ambulatory Visit (HOSPITAL_COMMUNITY): Payer: Self-pay

## 2016-09-02 DIAGNOSIS — R0902 Hypoxemia: Secondary | ICD-10-CM | POA: Diagnosis not present

## 2016-09-05 ENCOUNTER — Ambulatory Visit (HOSPITAL_COMMUNITY)
Admission: RE | Admit: 2016-09-05 | Discharge: 2016-09-05 | Disposition: A | Discharge status: Home / Self Care | Payer: Medicare Other | Source: Ambulatory Visit | Attending: Family Medicine | Admitting: Family Medicine

## 2016-09-05 DIAGNOSIS — R296 Repeated falls: Secondary | ICD-10-CM | POA: Insufficient documentation

## 2016-09-05 DIAGNOSIS — Z780 Asymptomatic menopausal state: Secondary | ICD-10-CM | POA: Diagnosis not present

## 2016-09-05 DIAGNOSIS — Z13820 Encounter for screening for osteoporosis: Secondary | ICD-10-CM | POA: Diagnosis not present

## 2016-09-05 DIAGNOSIS — M85831 Other specified disorders of bone density and structure, right forearm: Secondary | ICD-10-CM | POA: Insufficient documentation

## 2016-09-11 ENCOUNTER — Encounter: Payer: Self-pay | Admitting: Internal Medicine

## 2016-09-11 ENCOUNTER — Ambulatory Visit (INDEPENDENT_AMBULATORY_CARE_PROVIDER_SITE_OTHER): Payer: Medicare Other | Admitting: Internal Medicine

## 2016-09-11 VITALS — BP 132/72 | HR 70 | Ht 65.0 in | Wt 154.0 lb

## 2016-09-11 DIAGNOSIS — I495 Sick sinus syndrome: Secondary | ICD-10-CM | POA: Diagnosis not present

## 2016-09-11 DIAGNOSIS — I480 Paroxysmal atrial fibrillation: Secondary | ICD-10-CM

## 2016-09-11 DIAGNOSIS — Z950 Presence of cardiac pacemaker: Secondary | ICD-10-CM | POA: Diagnosis not present

## 2016-09-11 DIAGNOSIS — I4891 Unspecified atrial fibrillation: Secondary | ICD-10-CM

## 2016-09-11 LAB — CUP PACEART INCLINIC DEVICE CHECK
Implantable Lead Implant Date: 20110422
Implantable Lead Implant Date: 20110422
Implantable Lead Location: 753859
Implantable Pulse Generator Implant Date: 20110422
Lead Channel Impedance Value: 362.5 Ohm
Lead Channel Pacing Threshold Amplitude: 1 V
Lead Channel Pacing Threshold Pulse Width: 0.5 ms
Lead Channel Pacing Threshold Pulse Width: 0.5 ms
Lead Channel Sensing Intrinsic Amplitude: 3.8 mV
Lead Channel Setting Pacing Amplitude: 2.5 V
Lead Channel Setting Pacing Amplitude: 3.5 V
Lead Channel Setting Sensing Sensitivity: 1.5 mV
MDC IDC LEAD LOCATION: 753860
MDC IDC MSMT BATTERY VOLTAGE: 2.6 V
MDC IDC MSMT LEADCHNL RA PACING THRESHOLD AMPLITUDE: 2 V
MDC IDC MSMT LEADCHNL RA PACING THRESHOLD PULSEWIDTH: 0.5 ms
MDC IDC MSMT LEADCHNL RA SENSING INTR AMPL: 0.3 mV
MDC IDC MSMT LEADCHNL RV IMPEDANCE VALUE: 425 Ohm
MDC IDC MSMT LEADCHNL RV PACING THRESHOLD AMPLITUDE: 1 V
MDC IDC PG SERIAL: 7117341
MDC IDC SESS DTM: 20180115133928
MDC IDC SET LEADCHNL RV PACING PULSEWIDTH: 0.5 ms
MDC IDC STAT BRADY RA PERCENT PACED: 0.02 %
MDC IDC STAT BRADY RV PERCENT PACED: 73 %

## 2016-09-11 NOTE — Progress Notes (Signed)
HPI Mrs. Tschida returns today for followup. She is a very pleasant 83 year old woman with a history of symptomatic bradycardia, status post permanent pacemaker insertion, hypertension, and paroxysmal and now mostly chronic atrial fibrillation. In the interim, she has been stable. She denies chest pain but has had some shortness of breath. No syncope. No peripheral edema. Her atrial lead has an elevated pacing threshold which matters less now that she is in atrial fib. She admits to sodium indiscretion.  No Known Allergies   Current Outpatient Prescriptions  Medication Sig Dispense Refill  . Calcium Carb-Cholecalciferol (CALCIUM 500 +D) 500-400 MG-UNIT TABS Take 1 capsule by mouth daily.    Marland Kitchen DIGOX 125 MCG tablet TAKE 1 TABLET BY MOUTH ONCE A DAY. 30 tablet 11  . fluticasone (FLONASE) 50 MCG/ACT nasal spray USE 1 SPRAY IN EACH NOSTRIL DAILY. 48 g 1  . hydrochlorothiazide (HYDRODIURIL) 25 MG tablet TAKE 1 TABLET BY MOUTH ONCE DAILY. 30 tablet 2  . levothyroxine (SYNTHROID, LEVOTHROID) 112 MCG tablet Take 1 tablet (112 mcg total) by mouth daily. 30 tablet 5  . lovastatin (MEVACOR) 40 MG tablet TAKE ONE TABLET BY MOUTH DAILY. 30 tablet 2  . metoprolol (LOPRESSOR) 100 MG tablet TAKE 1 TABLET BY MOUTH TWICE A DAY. 60 tablet 2  . omeprazole (PRILOSEC) 40 MG capsule TAKE (1) CAPSULE BY MOUTH ONCE DAILY FOR ACID REFLUX. 30 capsule 3  . OXYGEN-HELIUM IN Inhale 2 L into the lungs at bedtime.    . Potassium 99 MG TABS Take 1 tablet by mouth daily.    Marland Kitchen warfarin (COUMADIN) 5 MG tablet Take 1/2 tablet daily except 1 tablet on Wednesdays 45 tablet 3   No current facility-administered medications for this visit.      Past Medical History:  Diagnosis Date  . Arthritis   . Gastroesophageal reflux disease   . Hearing impairment   . Hyperlipidemia   . Hypertension   . Hypothyroidism   . Impaired glucose tolerance   . Macular degeneration   . Pancreatitis   . Paroxysmal atrial fibrillation ([place]) 2011   . Sick sinus syndrome ([place]) 2011   Atrial fibrilation 10/2009; and dual chamber pacemaker in 4/11; normal EF  . Sleep apnea    Nocturnal oxygen therapy  . Zoster 2016    ROS:   All systems reviewed and negative except as noted in the HPI.   Past Surgical History:  Procedure Laterality Date  . ABDOMINAL HYSTERECTOMY    . CATARACT EXTRACTION     Bilateral  . COLONOSCOPY  2009   Dr. Gala Romney  . EYE SURGERY     laser   . PACEMAKER INSERTION  2011   dual chamber   . SALPINGOOPHORECTOMY  1970   right     Family History  Problem Relation Age of Onset  . Cancer Mother     gential  . Cancer Father     throat   . Pneumonia Sister   . Heart disease    . Arthritis    . Lung disease    . Asthma       Social History   Social History  . Marital status: Widowed    Spouse name: N/A  . Number of children: 7  . Years of education: college   Occupational History  . retired  Retired   Social History Main Topics  . Smoking status: Never Smoker  . Smokeless tobacco: Never Used  . Alcohol use No  . Drug use: No  . Sexual activity:  Not on file   Other Topics Concern  . Not on file   Social History Narrative  . No narrative on file     BP 132/72   Pulse 70   Ht 5\' 5"  (1.651 m)   Wt 154 lb (69.9 kg)   SpO2 98%   BMI 25.63 kg/m   Physical Exam:  Well appearing 83 year old woman, NAD HEENT: Unremarkable Neck:  6 cm JVD, no thyromegally Back:  No CVA tenderness Lungs:  Clear with no wheezes, rales, or rhonchi. HEART:  Regular rate rhythm, no murmurs, no rubs, no clicks Abd:  soft, positive bowel sounds, no organomegally, no rebound, no guarding Ext:  2 plus pulses, no edema, no cyanosis, no clubbing Skin:  No rashes no nodules Neuro:  CN II through XII intact, motor grossly intact  DEVICE  Normal device function.  See PaceArt for details.   Assess/Plan: 1. Atrial fib - she will continue coumadin. She is asymptomatic. Her rate is controlled. 2. PPM - she  is at ERI voltage. Will schedule PPM gen change in 8 weeks.  3. Atrial lead dysfunction - she has had an elevated pacing threshold. However, she is now mostly in atrial fib so at this point, I will likely not attempt to place a new atrial lead. 4. HTN - her blood pressure is better. She will continue her current meds and maintain a low sodium diet.  Mikle Bosworth.D.

## 2016-09-11 NOTE — Patient Instructions (Signed)
Your physician wants you to follow-up in: 1 Year with Dr. Lovena Le. You will receive a reminder letter in the mail two months in advance. If you don't receive a letter, please call our office to schedule the follow-up appointment.  Your physician recommends that you continue on your current medications as directed. Please refer to the Current Medication list given to you today.  Your physician recommends that you return for lab work in: 11/08/16  Your physician has recommended that you have a pacemaker inserted. A pacemaker is a small device that is placed under the skin of your chest or abdomen to help control abnormal heart rhythms. This device uses electrical pulses to prompt the heart to beat at a normal rate. Pacemakers are used to treat heart rhythms that are too slow. Wire (leads) are attached to the pacemaker that goes into the chambers of you heart. This is done in the hospital and usually requires and overnight stay. Please see the instruction sheet given to you today for more information.  If you need a refill on your cardiac medications before your next appointment, please call your pharmacy.  Thank you for choosing [facility]!

## 2016-09-12 ENCOUNTER — Encounter: Payer: Self-pay | Admitting: *Deleted

## 2016-09-12 ENCOUNTER — Encounter: Payer: Self-pay | Admitting: Internal Medicine

## 2016-09-20 ENCOUNTER — Ambulatory Visit (INDEPENDENT_AMBULATORY_CARE_PROVIDER_SITE_OTHER): Payer: Medicare Other | Admitting: *Deleted

## 2016-09-20 DIAGNOSIS — I480 Paroxysmal atrial fibrillation: Secondary | ICD-10-CM

## 2016-09-20 LAB — POCT INR: INR: 3

## 2016-09-22 ENCOUNTER — Other Ambulatory Visit: Payer: Self-pay | Admitting: Cardiology

## 2016-09-28 ENCOUNTER — Encounter: Payer: Self-pay | Admitting: Internal Medicine

## 2016-10-03 DIAGNOSIS — R0902 Hypoxemia: Secondary | ICD-10-CM | POA: Diagnosis not present

## 2016-10-18 ENCOUNTER — Ambulatory Visit (INDEPENDENT_AMBULATORY_CARE_PROVIDER_SITE_OTHER): Payer: Medicare Other | Admitting: *Deleted

## 2016-10-18 ENCOUNTER — Ambulatory Visit (INDEPENDENT_AMBULATORY_CARE_PROVIDER_SITE_OTHER): Payer: Medicare Other

## 2016-10-18 VITALS — BP 122/68 | HR 69 | Temp 98.2°F | Ht 65.0 in | Wt 157.1 lb

## 2016-10-18 DIAGNOSIS — M8580 Other specified disorders of bone density and structure, unspecified site: Secondary | ICD-10-CM

## 2016-10-18 DIAGNOSIS — E782 Mixed hyperlipidemia: Secondary | ICD-10-CM

## 2016-10-18 DIAGNOSIS — Z0000 Encounter for general adult medical examination without abnormal findings: Secondary | ICD-10-CM | POA: Diagnosis not present

## 2016-10-18 DIAGNOSIS — E038 Other specified hypothyroidism: Secondary | ICD-10-CM

## 2016-10-18 DIAGNOSIS — R7301 Impaired fasting glucose: Secondary | ICD-10-CM

## 2016-10-18 DIAGNOSIS — R296 Repeated falls: Secondary | ICD-10-CM

## 2016-10-18 DIAGNOSIS — I10 Essential (primary) hypertension: Secondary | ICD-10-CM | POA: Diagnosis not present

## 2016-10-18 DIAGNOSIS — I480 Paroxysmal atrial fibrillation: Secondary | ICD-10-CM | POA: Diagnosis not present

## 2016-10-18 LAB — POCT INR: INR: 6.4

## 2016-10-18 NOTE — Progress Notes (Addendum)
Subjective:   [name] is a 83 y.o. female who presents for Medicare Annual (Subsequent) preventive examination.  Review of Systems: N/A Cardiac Risk Factors include: advanced age (>8men, >74 women);dyslipidemia;hypertension     Objective:     Vitals: BP 122/68   Pulse 69   Temp 98.2 F (36.8 C) (Oral)   Ht 5\' 5"  (1.651 m)   Wt 157 lb 1.3 oz (71.3 kg)   SpO2 93%   BMI 26.14 kg/m   Body mass index is 26.14 kg/m.   Tobacco History  Smoking Status  . Never Smoker  Smokeless Tobacco  . Never Used     Counseling given: Not Answered   Past Medical History:  Diagnosis Date  . Arthritis   . Gastroesophageal reflux disease   . Hearing impairment   . Hyperlipidemia   . Hypertension   . Hypothyroidism   . Impaired glucose tolerance   . Macular degeneration   . Pancreatitis   . Paroxysmal atrial fibrillation ([place]) 2011  . Sick sinus syndrome ([place]) 2011   Atrial fibrilation 10/2009; and dual chamber pacemaker in 4/11; normal EF  . Sleep apnea    Nocturnal oxygen therapy  . Zoster 2016   Past Surgical History:  Procedure Laterality Date  . ABDOMINAL HYSTERECTOMY    . CATARACT EXTRACTION     Bilateral  . COLONOSCOPY  2009   Dr. Gala Romney  . EYE SURGERY     laser   . PACEMAKER INSERTION  2011   dual chamber   . SALPINGOOPHORECTOMY  1970   right   Family History  Problem Relation Age of Onset  . Cancer Mother     gential  . Cancer Father     throat   . Pneumonia Sister   . Emphysema Sister   . Mitral valve prolapse Sister   . Heart disease    . Arthritis    . Lung disease    . Asthma    . Melanoma Son    History  Sexual Activity  . Sexual activity: No    Outpatient Encounter Prescriptions as of 10/18/2016  Medication Sig  . Calcium Carb-Cholecalciferol (CALCIUM 500 +D) 500-400 MG-UNIT TABS Take 1 capsule by mouth daily.  Marland Kitchen DIGOX 125 MCG tablet TAKE 1 TABLET BY MOUTH ONCE A DAY.  . fluticasone (FLONASE) 50 MCG/ACT nasal spray USE 1 SPRAY IN  EACH NOSTRIL DAILY.  . hydrochlorothiazide (HYDRODIURIL) 25 MG tablet TAKE 1 TABLET BY MOUTH ONCE DAILY.  Marland Kitchen levothyroxine (SYNTHROID, LEVOTHROID) 112 MCG tablet Take 1 tablet (112 mcg total) by mouth daily.  Marland Kitchen lovastatin (MEVACOR) 40 MG tablet TAKE ONE TABLET BY MOUTH DAILY.  . metoprolol (LOPRESSOR) 100 MG tablet TAKE 1 TABLET BY MOUTH TWICE A DAY.  Marland Kitchen omeprazole (PRILOSEC) 40 MG capsule TAKE (1) CAPSULE BY MOUTH ONCE DAILY FOR ACID REFLUX.  Marland Kitchen OXYGEN-HELIUM IN Inhale 2 L into the lungs at bedtime.  . Potassium 99 MG TABS Take 1 tablet by mouth daily.  Marland Kitchen warfarin (COUMADIN) 5 MG tablet TAKE 1/2 TABLET BY MOUTH DAILY EXCEPT 1 TABLET ON WEDNESDAYS OR AS DIRECTED.   No facility-administered encounter medications on file as of 10/18/2016.     Activities of Daily Living In your present state of health, do you have any difficulty performing the following activities: 10/18/2016 04/19/2016  Hearing? Tempie Donning  Vision? Y Y  Difficulty concentrating or making decisions? N N  Walking or climbing stairs? Y Y  Dressing or bathing? N N  Doing  errands, shopping? Tempie Donning  Preparing Food and eating ? Y Y  Using the Toilet? N N  In the past six months, have you accidently leaked urine? Y Y  Do you have problems with loss of bowel control? N N  Managing your Medications? N N  Managing your Finances? N N  Housekeeping or managing your Housekeeping? N Y  Some recent data might be hidden    Patient Care Team: Fayrene Helper, MD as PCP - General (Family Medicine) Evans Lance, MD (Cardiology) Daneil Dolin, MD as Attending Physician (Gastroenterology) Yehuda Savannah, MD as Attending Physician (Cardiology) Hayden Pedro, MD as Attending Physician (Ophthalmology) Sinda Du, MD as Attending Physician (Pulmonary Disease)    Assessment:    Exercise Activities and Dietary recommendations Current Exercise Habits: Home exercise routine, Type of exercise: stretching, Time (Minutes): 10, Frequency  (Times/Week): 3, Weekly Exercise (Minutes/Week): 30, Intensity: Mild  Goals    . Increase physical activity          Patient would like to be able to increase her physical activity so she can be more active with her great grandchildren.      Fall Risk Fall Risk  10/18/2016 04/19/2016 12/17/2014 06/09/2013  Falls in the past year? Yes Yes No No  Number falls in past yr: 2 or more 1 - -  Injury with Fall? No No - -  Risk for fall due to : History of fall(s);Impaired balance/gait;Impaired vision Impaired balance/gait - Impaired mobility;Impaired balance/gait  Follow up Falls prevention discussed;Falls evaluation completed Falls evaluation completed - -   Depression Screen PHQ 2/9 Scores 10/18/2016 04/19/2016 12/17/2014 06/09/2013  PHQ - 2 Score 0 0 4 0  PHQ- 9 Score - - 10 -     Cognitive Function   Normal 6CIT Screen 10/18/2016  What Year? 0 points  What month? 0 points  What time? 0 points  Count back from 20 0 points  Months in reverse 0 points  Repeat phrase 0 points  Total Score 0    Immunization History  Administered Date(s) Administered  . H1N1 07/01/2008  . Influenza Split 06/14/2012  . Influenza Whole 06/22/2006, 05/27/2008, 06/10/2009, 06/09/2010, 05/10/2011  . Influenza,inj,Quad PF,36+ Mos 06/09/2013, 08/12/2014, 07/15/2015, 04/19/2016  . Pneumococcal Conjugate-13 03/18/2014  . Pneumococcal Polysaccharide-23 06/09/2008  . Tdap 04/22/2015   Screening Tests Health Maintenance  Topic Date Due  . TETANUS/TDAP  04/21/2025  . INFLUENZA VACCINE  Completed  . DEXA SCAN  Completed  . PNA vac Low Risk Adult  Completed      Plan:  I have personally reviewed and addressed the Medicare Annual Wellness questionnaire and have noted the following in the patient's chart:  A. Medical and social history B. Use of alcohol, tobacco or illicit drugs  C. Current medications and supplements D. Functional ability and status E.  Nutritional status F.  Physical activity G. Advance  directives H. List of other physicians I.  Hospitalizations, surgeries, and ER visits in previous 12 months J.  [place] to include cognitive, depression, and falls L. Referrals and appointments - Referral to [facility] for the safe strides program.   In addition, I have reviewed and discussed with patient certain preventive protocols, quality metrics, and best practice recommendations. A written personalized care plan for preventive services as well as general preventive health recommendations were provided to patient.  Signed,   Stormy Fabian, LPN Lead Nurse Health Advisor

## 2016-10-18 NOTE — Patient Instructions (Addendum)
[name] , Thank you for taking time to come for your Medicare Wellness Visit. I appreciate your ongoing commitment to your health goals. Please review the following plan we discussed and let me know if I can assist you in the future.    Screening recommendations: Up to date  Abnormal screenings: Fall risk screen  Patient concerns: None  Nurse concerns: Falls. Recommend safe strides program with [facility]. I will discuss with Dr. Moshe Cipro and if she agrees I will send over a referral and they will be in contact with you.   Next appt: Follow up with Dr. Moshe Cipro on 11/27/2016 at 9:20 am . Lab work has been ordered for you today, please have labs completed a week prior to your appointment. Follow up in 1 year for your annual wellness visit.   Fall Prevention in the Home Introduction Falls can cause injuries. They can happen to people of all ages. There are many things you can do to make your home safe and to help prevent falls. What can I do on the outside of my home?  Regularly fix the edges of walkways and driveways and fix any cracks.  Remove anything that might make you trip as you walk through a door, such as a raised step or threshold.  Trim any bushes or trees on the path to your home.  Use bright outdoor lighting.  Clear any walking paths of anything that might make someone trip, such as rocks or tools.  Regularly check to see if handrails are loose or broken. Make sure that both sides of any steps have handrails.  Any raised decks and porches should have guardrails on the edges.  Have any leaves, snow, or ice cleared regularly.  Use sand or salt on walking paths during winter.  Clean up any spills in your garage right away. This includes oil or grease spills. What can I do in the bathroom?  Use night lights.  Install grab bars by the toilet and in the tub and shower. Do not use towel bars as grab bars.  Use non-skid mats or decals in the tub or shower.  If you  need to sit down in the shower, use a plastic, non-slip stool.  Keep the floor dry. Clean up any water that spills on the floor as soon as it happens.  Remove soap buildup in the tub or shower regularly.  Attach bath mats securely with double-sided non-slip rug tape.  Do not have throw rugs and other things on the floor that can make you trip. What can I do in the bedroom?  Use night lights.  Make sure that you have a light by your bed that is easy to reach.  Do not use any sheets or blankets that are too big for your bed. They should not hang down onto the floor.  Have a firm chair that has side arms. You can use this for support while you get dressed.  Do not have throw rugs and other things on the floor that can make you trip. What can I do in the kitchen?  Clean up any spills right away.  Avoid walking on wet floors.  Keep items that you use a lot in easy-to-reach places.  If you need to reach something above you, use a strong step stool that has a grab bar.  Keep electrical cords out of the way.  Do not use floor polish or wax that makes floors slippery. If you must use wax, use  non-skid floor wax.  Do not have throw rugs and other things on the floor that can make you trip. What can I do with my stairs?  Do not leave any items on the stairs.  Make sure that there are handrails on both sides of the stairs and use them. Fix handrails that are broken or loose. Make sure that handrails are as long as the stairways.  Check any carpeting to make sure that it is firmly attached to the stairs. Fix any carpet that is loose or worn.  Avoid having throw rugs at the top or bottom of the stairs. If you do have throw rugs, attach them to the floor with carpet tape.  Make sure that you have a light switch at the top of the stairs and the bottom of the stairs. If you do not have them, ask someone to add them for you. What else can I do to help prevent falls?  Wear shoes  that:  Do not have high heels.  Have rubber bottoms.  Are comfortable and fit you well.  Are closed at the toe. Do not wear sandals.  If you use a stepladder:  Make sure that it is fully opened. Do not climb a closed stepladder.  Make sure that both sides of the stepladder are locked into place.  Ask someone to hold it for you, if possible.  Clearly mark and make sure that you can see:  Any grab bars or handrails.  First and last steps.  Where the edge of each step is.  Use tools that help you move around (mobility aids) if they are needed. These include:  Canes.  Walkers.  Scooters.  Crutches.  Turn on the lights when you go into a dark area. Replace any light bulbs as soon as they burn out.  Set up your furniture so you have a clear path. Avoid moving your furniture around.  If any of your floors are uneven, fix them.  If there are any pets around you, be aware of where they are.  Review your medicines with your doctor. Some medicines can make you feel dizzy. This can increase your chance of falling. Ask your doctor what other things that you can do to help prevent falls. This information is not intended to replace advice given to you by your health care provider. Make sure you discuss any questions you have with your health care provider. Document Released: 06/10/2009 Document Revised: 01/20/2016 Document Reviewed: 09/18/2014  2017 [employer]vier

## 2016-10-25 ENCOUNTER — Encounter (INDEPENDENT_AMBULATORY_CARE_PROVIDER_SITE_OTHER): Payer: Self-pay | Admitting: Ophthalmology

## 2016-10-25 ENCOUNTER — Ambulatory Visit (INDEPENDENT_AMBULATORY_CARE_PROVIDER_SITE_OTHER): Payer: Medicare Other | Admitting: *Deleted

## 2016-10-25 DIAGNOSIS — I480 Paroxysmal atrial fibrillation: Secondary | ICD-10-CM

## 2016-10-25 LAB — POCT INR: INR: 2.8

## 2016-10-31 DIAGNOSIS — R0902 Hypoxemia: Secondary | ICD-10-CM | POA: Diagnosis not present

## 2016-11-01 ENCOUNTER — Encounter: Payer: Self-pay | Admitting: Family Medicine

## 2016-11-01 ENCOUNTER — Ambulatory Visit (INDEPENDENT_AMBULATORY_CARE_PROVIDER_SITE_OTHER): Payer: Medicare Other | Admitting: Family Medicine

## 2016-11-01 VITALS — BP 136/76 | HR 100 | Temp 96.6°F | Resp 20 | Ht 65.0 in | Wt 150.0 lb

## 2016-11-01 DIAGNOSIS — J3089 Other allergic rhinitis: Secondary | ICD-10-CM | POA: Diagnosis not present

## 2016-11-01 DIAGNOSIS — R296 Repeated falls: Secondary | ICD-10-CM | POA: Diagnosis not present

## 2016-11-01 DIAGNOSIS — M8580 Other specified disorders of bone density and structure, unspecified site: Secondary | ICD-10-CM | POA: Diagnosis not present

## 2016-11-01 DIAGNOSIS — Z7952 Long term (current) use of systemic steroids: Secondary | ICD-10-CM | POA: Diagnosis not present

## 2016-11-01 DIAGNOSIS — H9190 Unspecified hearing loss, unspecified ear: Secondary | ICD-10-CM | POA: Diagnosis not present

## 2016-11-01 DIAGNOSIS — Z950 Presence of cardiac pacemaker: Secondary | ICD-10-CM | POA: Diagnosis not present

## 2016-11-01 DIAGNOSIS — R2681 Unsteadiness on feet: Secondary | ICD-10-CM | POA: Diagnosis not present

## 2016-11-01 DIAGNOSIS — R05 Cough: Secondary | ICD-10-CM

## 2016-11-01 DIAGNOSIS — Z9981 Dependence on supplemental oxygen: Secondary | ICD-10-CM | POA: Diagnosis not present

## 2016-11-01 DIAGNOSIS — I10 Essential (primary) hypertension: Secondary | ICD-10-CM | POA: Diagnosis not present

## 2016-11-01 DIAGNOSIS — Z791 Long term (current) use of non-steroidal anti-inflammatories (NSAID): Secondary | ICD-10-CM | POA: Diagnosis not present

## 2016-11-01 DIAGNOSIS — R059 Cough, unspecified: Secondary | ICD-10-CM

## 2016-11-01 DIAGNOSIS — H3530 Unspecified macular degeneration: Secondary | ICD-10-CM | POA: Diagnosis not present

## 2016-11-01 MED ORDER — PREDNISONE 20 MG PO TABS: 20.0000 mg | ORAL_TABLET | Freq: Every day | ORAL | 0 refills | Status: DC

## 2016-11-01 NOTE — Patient Instructions (Signed)
Continue claritin once a day (not claritin D) Continue the flonase and saline nasal wash Add prednisone once a day for a week Call for problems

## 2016-11-01 NOTE — Progress Notes (Signed)
Chief Complaint  Patient presents with  . Sinus Problem   Patient complains of post nasal drip and runny nose, sinus fullness for three weeks.  Slow improvement.  No fever or chills   No nauseas or vomiting.  No chest congestion or wheeze.  No sputum.  Drainage is clear.  She thinks it is uncontrolled allergies.   She has a scheduled pacemaker battery change Mar 19th, and wants to be well enough to proceed. Otherwise is well. Is taking daily claritin.  Uses flonase and saline nasal washes.  Drinks plenty of water.  Patient Active Problem List   Diagnosis Date Noted  . Osteopenia 10/18/2016  . Falls frequently 10/21/2015  . Seasonal allergies 05/05/2014  . GERD (gastroesophageal reflux disease) 05/05/2014  . Sick sinus syndrome ([place])   . Chronic anticoagulation 11/18/2010  . Impaired fasting glucose 10/14/2010  . Paroxysmal atrial fibrillation ([place]) 06/14/2010  . 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 Hypothyroidism 02/25/2008  . Hyperlipidemia 02/25/2008  . HEARING LOSS 02/25/2008  . Essential hypertension 02/25/2008    Outpatient Encounter Prescriptions as of 11/01/2016  Medication Sig  . Calcium Carb-Cholecalciferol (CALCIUM 500 +D) 500-400 MG-UNIT TABS Take 1 capsule by mouth daily.  Marland Kitchen DIGOX 125 MCG tablet TAKE 1 TABLET BY MOUTH ONCE A DAY.  . fluticasone (FLONASE) 50 MCG/ACT nasal spray USE 1 SPRAY IN EACH NOSTRIL DAILY.  . hydrochlorothiazide (HYDRODIURIL) 25 MG tablet TAKE 1 TABLET BY MOUTH ONCE DAILY.  Marland Kitchen levothyroxine (SYNTHROID, LEVOTHROID) 112 MCG tablet Take 1 tablet (112 mcg total) by mouth daily.  Marland Kitchen lovastatin (MEVACOR) 40 MG tablet TAKE ONE TABLET BY MOUTH DAILY.  . metoprolol (LOPRESSOR) 100 MG tablet TAKE 1 TABLET BY MOUTH TWICE A DAY.  Marland Kitchen omeprazole (PRILOSEC) 40 MG capsule TAKE (1) CAPSULE BY MOUTH ONCE DAILY FOR ACID REFLUX.  Marland Kitchen OXYGEN-HELIUM IN Inhale 2 L into the lungs at bedtime.  . Potassium 99 MG TABS Take 1 tablet by mouth daily.  Marland Kitchen warfarin (COUMADIN) 5 MG tablet  TAKE 1/2 TABLET BY MOUTH DAILY EXCEPT 1 TABLET ON WEDNESDAYS OR AS DIRECTED.  Marland Kitchen predniSONE (DELTASONE) 20 MG tablet Take 1 tablet (20 mg total) by mouth daily with breakfast.   No facility-administered encounter medications on file as of 11/01/2016.     No Known Allergies  Review of Systems  Constitutional: Negative for appetite change, chills and fever.  HENT: Positive for congestion, postnasal drip, rhinorrhea and sinus pressure. Negative for sore throat.   Eyes: Positive for visual disturbance. Negative for redness.  Respiratory: Positive for cough. Negative for shortness of breath and wheezing.   Cardiovascular: Negative for chest pain and palpitations.  Gastrointestinal: Negative for nausea and vomiting.  Musculoskeletal: Negative for back pain and myalgias.  Neurological: Negative for dizziness and headaches.    BP 136/76 (BP Location: Left Arm, Patient Position: Sitting, Cuff Size: Normal)   Pulse 100   Temp (!) 96.6 F (35.9 C) (Temporal)   Resp 20   Ht 5\' 5"  (1.651 m)   Wt 150 lb (68 kg)   SpO2 98%   BMI 24.96 kg/m   Physical Exam  Constitutional: She is oriented to person, place, and time. She appears well-developed and well-nourished. No distress.  HENT:  Head: Normocephalic and atraumatic.  Right Ear: External ear normal.  Left Ear: External ear normal.  Nose: Nose normal.  Mouth/Throat: Oropharynx is clear and moist. No oropharyngeal exudate.  Nasal membranes pale and swollen.  Clear d/c  Eyes: Pupils are equal, round, and reactive  to light.  Cardiovascular: An irregularly irregular rhythm present. Tachycardia present.   Pulmonary/Chest: Effort normal and breath sounds normal. No respiratory distress. She has no wheezes.  Abdominal: Soft. Bowel sounds are normal. There is no tenderness.  Musculoskeletal: Normal range of motion. She exhibits no edema.  Lymphadenopathy:    She has no cervical adenopathy.  Neurological: She is alert and oriented to person, place,  and time.  Psychiatric: She has a normal mood and affect. Her behavior is normal. Thought content normal.    ASSESSMENT/PLAN:  1. Cough   2. Chronic allergic rhinitis due to other allergic trigger, unspecified seasonality  Discussed no evidence infection or need antibiotics.   Patient Instructions  Continue claritin once a day (not claritin D) Continue the flonase and saline nasal wash Add prednisone once a day for a week Call for problems   Raylene Everts, MD

## 2016-11-03 DIAGNOSIS — Z950 Presence of cardiac pacemaker: Secondary | ICD-10-CM | POA: Diagnosis not present

## 2016-11-03 DIAGNOSIS — M8580 Other specified disorders of bone density and structure, unspecified site: Secondary | ICD-10-CM | POA: Diagnosis not present

## 2016-11-03 DIAGNOSIS — Z7952 Long term (current) use of systemic steroids: Secondary | ICD-10-CM | POA: Diagnosis not present

## 2016-11-03 DIAGNOSIS — H3530 Unspecified macular degeneration: Secondary | ICD-10-CM | POA: Diagnosis not present

## 2016-11-03 DIAGNOSIS — H9190 Unspecified hearing loss, unspecified ear: Secondary | ICD-10-CM | POA: Diagnosis not present

## 2016-11-03 DIAGNOSIS — R296 Repeated falls: Secondary | ICD-10-CM | POA: Diagnosis not present

## 2016-11-03 DIAGNOSIS — Z9981 Dependence on supplemental oxygen: Secondary | ICD-10-CM | POA: Diagnosis not present

## 2016-11-03 DIAGNOSIS — I10 Essential (primary) hypertension: Secondary | ICD-10-CM | POA: Diagnosis not present

## 2016-11-03 DIAGNOSIS — R2681 Unsteadiness on feet: Secondary | ICD-10-CM | POA: Diagnosis not present

## 2016-11-03 DIAGNOSIS — Z791 Long term (current) use of non-steroidal anti-inflammatories (NSAID): Secondary | ICD-10-CM | POA: Diagnosis not present

## 2016-11-07 DIAGNOSIS — Z9981 Dependence on supplemental oxygen: Secondary | ICD-10-CM | POA: Diagnosis not present

## 2016-11-07 DIAGNOSIS — Z791 Long term (current) use of non-steroidal anti-inflammatories (NSAID): Secondary | ICD-10-CM | POA: Diagnosis not present

## 2016-11-07 DIAGNOSIS — Z7952 Long term (current) use of systemic steroids: Secondary | ICD-10-CM | POA: Diagnosis not present

## 2016-11-07 DIAGNOSIS — Z950 Presence of cardiac pacemaker: Secondary | ICD-10-CM | POA: Diagnosis not present

## 2016-11-07 DIAGNOSIS — R296 Repeated falls: Secondary | ICD-10-CM | POA: Diagnosis not present

## 2016-11-07 DIAGNOSIS — M8580 Other specified disorders of bone density and structure, unspecified site: Secondary | ICD-10-CM | POA: Diagnosis not present

## 2016-11-07 DIAGNOSIS — I10 Essential (primary) hypertension: Secondary | ICD-10-CM | POA: Diagnosis not present

## 2016-11-07 DIAGNOSIS — H9190 Unspecified hearing loss, unspecified ear: Secondary | ICD-10-CM | POA: Diagnosis not present

## 2016-11-07 DIAGNOSIS — R2681 Unsteadiness on feet: Secondary | ICD-10-CM | POA: Diagnosis not present

## 2016-11-07 DIAGNOSIS — H3530 Unspecified macular degeneration: Secondary | ICD-10-CM | POA: Diagnosis not present

## 2016-11-08 ENCOUNTER — Ambulatory Visit (INDEPENDENT_AMBULATORY_CARE_PROVIDER_SITE_OTHER): Payer: Medicare Other | Admitting: *Deleted

## 2016-11-08 DIAGNOSIS — I480 Paroxysmal atrial fibrillation: Secondary | ICD-10-CM | POA: Diagnosis not present

## 2016-11-08 LAB — CBC WITH DIFFERENTIAL/PLATELET
Basophils Absolute: 149 cells/uL (ref 0–200)
Basophils Relative: 1 %
EOS ABS: 149 {cells}/uL (ref 15–500)
Eosinophils Relative: 1 %
HCT: 37.8 % (ref 35.0–45.0)
Hemoglobin: 12.4 g/dL (ref 11.7–15.5)
Lymphocytes Relative: 28 %
Lymphs Abs: 4172 cells/uL — ABNORMAL HIGH (ref 850–3900)
MCH: 29.5 pg (ref 27.0–33.0)
MCHC: 32.8 g/dL (ref 32.0–36.0)
MCV: 89.8 fL (ref 80.0–100.0)
MONOS PCT: 11 %
MPV: 8.8 fL (ref 7.5–12.5)
Monocytes Absolute: 1639 cells/uL — ABNORMAL HIGH (ref 200–950)
NEUTROS ABS: 8791 {cells}/uL — AB (ref 1500–7800)
Neutrophils Relative %: 59 %
PLATELETS: 318 10*3/uL (ref 140–400)
RBC: 4.21 MIL/uL (ref 3.80–5.10)
RDW: 14 % (ref 11.0–15.0)
WBC: 14.9 10*3/uL — ABNORMAL HIGH (ref 3.8–10.8)

## 2016-11-08 LAB — BASIC METABOLIC PANEL
BUN: 23 mg/dL (ref 7–25)
CO2: 31 mmol/L (ref 20–31)
Calcium: 10.1 mg/dL (ref 8.6–10.4)
Chloride: 99 mmol/L (ref 98–110)
Creat: 1.28 mg/dL — ABNORMAL HIGH (ref 0.60–0.88)
GLUCOSE: 97 mg/dL (ref 65–99)
POTASSIUM: 3.7 mmol/L (ref 3.5–5.3)
SODIUM: 139 mmol/L (ref 135–146)

## 2016-11-08 LAB — POCT INR: INR: 2.5

## 2016-11-09 DIAGNOSIS — M8580 Other specified disorders of bone density and structure, unspecified site: Secondary | ICD-10-CM | POA: Diagnosis not present

## 2016-11-09 DIAGNOSIS — Z9981 Dependence on supplemental oxygen: Secondary | ICD-10-CM | POA: Diagnosis not present

## 2016-11-09 DIAGNOSIS — Z791 Long term (current) use of non-steroidal anti-inflammatories (NSAID): Secondary | ICD-10-CM | POA: Diagnosis not present

## 2016-11-09 DIAGNOSIS — Z950 Presence of cardiac pacemaker: Secondary | ICD-10-CM | POA: Diagnosis not present

## 2016-11-09 DIAGNOSIS — I10 Essential (primary) hypertension: Secondary | ICD-10-CM | POA: Diagnosis not present

## 2016-11-09 DIAGNOSIS — R2681 Unsteadiness on feet: Secondary | ICD-10-CM | POA: Diagnosis not present

## 2016-11-09 DIAGNOSIS — Z7952 Long term (current) use of systemic steroids: Secondary | ICD-10-CM | POA: Diagnosis not present

## 2016-11-09 DIAGNOSIS — R296 Repeated falls: Secondary | ICD-10-CM | POA: Diagnosis not present

## 2016-11-09 DIAGNOSIS — H3530 Unspecified macular degeneration: Secondary | ICD-10-CM | POA: Diagnosis not present

## 2016-11-09 DIAGNOSIS — H9190 Unspecified hearing loss, unspecified ear: Secondary | ICD-10-CM | POA: Diagnosis not present

## 2016-11-09 LAB — PROTIME-INR
INR: 2.4 — ABNORMAL HIGH
PROTHROMBIN TIME: 24.5 s — AB (ref 9.0–11.5)

## 2016-11-13 ENCOUNTER — Ambulatory Visit (HOSPITAL_COMMUNITY)
Admission: RE | Disposition: A | Discharge status: Home / Self Care | Payer: Self-pay | Source: Ambulatory Visit | Attending: Internal Medicine

## 2016-11-13 ENCOUNTER — Ambulatory Visit (HOSPITAL_COMMUNITY)
Admission: RE | Admit: 2016-11-13 | Discharge: 2016-11-13 | Disposition: A | Discharge status: Home / Self Care | Payer: Medicare Other | Source: Ambulatory Visit | Attending: Internal Medicine | Admitting: Internal Medicine

## 2016-11-13 ENCOUNTER — Encounter (HOSPITAL_COMMUNITY): Payer: Self-pay | Admitting: Internal Medicine

## 2016-11-13 DIAGNOSIS — Z79899 Other long term (current) drug therapy: Secondary | ICD-10-CM | POA: Diagnosis not present

## 2016-11-13 DIAGNOSIS — I495 Sick sinus syndrome: Secondary | ICD-10-CM | POA: Diagnosis present

## 2016-11-13 DIAGNOSIS — Z45018 Encounter for adjustment and management of other part of cardiac pacemaker: Secondary | ICD-10-CM | POA: Diagnosis not present

## 2016-11-13 DIAGNOSIS — Z9981 Dependence on supplemental oxygen: Secondary | ICD-10-CM | POA: Diagnosis not present

## 2016-11-13 DIAGNOSIS — H9190 Unspecified hearing loss, unspecified ear: Secondary | ICD-10-CM | POA: Insufficient documentation

## 2016-11-13 DIAGNOSIS — K219 Gastro-esophageal reflux disease without esophagitis: Secondary | ICD-10-CM | POA: Insufficient documentation

## 2016-11-13 DIAGNOSIS — H3530 Unspecified macular degeneration: Secondary | ICD-10-CM | POA: Insufficient documentation

## 2016-11-13 DIAGNOSIS — G4730 Sleep apnea, unspecified: Secondary | ICD-10-CM | POA: Insufficient documentation

## 2016-11-13 DIAGNOSIS — Z950 Presence of cardiac pacemaker: Secondary | ICD-10-CM | POA: Diagnosis not present

## 2016-11-13 DIAGNOSIS — I10 Essential (primary) hypertension: Secondary | ICD-10-CM | POA: Insufficient documentation

## 2016-11-13 DIAGNOSIS — E785 Hyperlipidemia, unspecified: Secondary | ICD-10-CM | POA: Insufficient documentation

## 2016-11-13 DIAGNOSIS — I482 Chronic atrial fibrillation: Secondary | ICD-10-CM | POA: Insufficient documentation

## 2016-11-13 DIAGNOSIS — E039 Hypothyroidism, unspecified: Secondary | ICD-10-CM | POA: Insufficient documentation

## 2016-11-13 DIAGNOSIS — Z7901 Long term (current) use of anticoagulants: Secondary | ICD-10-CM | POA: Diagnosis not present

## 2016-11-13 DIAGNOSIS — I481 Persistent atrial fibrillation: Secondary | ICD-10-CM | POA: Insufficient documentation

## 2016-11-13 DIAGNOSIS — M1990 Unspecified osteoarthritis, unspecified site: Secondary | ICD-10-CM | POA: Diagnosis not present

## 2016-11-13 HISTORY — PX: PPM GENERATOR CHANGEOUT: EP1233

## 2016-11-13 LAB — BASIC METABOLIC PANEL
ANION GAP: 11 (ref 5–15)
BUN: 21 mg/dL — ABNORMAL HIGH (ref 6–20)
CO2: 27 mmol/L (ref 22–32)
Calcium: 9.2 mg/dL (ref 8.9–10.3)
Chloride: 99 mmol/L — ABNORMAL LOW (ref 101–111)
Creatinine, Ser: 1.2 mg/dL — ABNORMAL HIGH (ref 0.44–1.00)
GFR, EST AFRICAN AMERICAN: 45 mL/min — AB (ref >60)
GFR, EST NON AFRICAN AMERICAN: 39 mL/min — AB (ref >60)
GLUCOSE: 114 mg/dL — AB (ref 65–99)
POTASSIUM: 3.6 mmol/L (ref 3.5–5.1)
Sodium: 137 mmol/L (ref 135–145)

## 2016-11-13 LAB — CBC
HEMATOCRIT: 36 % (ref 36.0–46.0)
Hemoglobin: 11.9 g/dL — ABNORMAL LOW (ref 12.0–15.0)
MCH: 29.8 pg (ref 26.0–34.0)
MCHC: 33.1 g/dL (ref 30.0–36.0)
MCV: 90 fL (ref 78.0–100.0)
Platelets: 245 10*3/uL (ref 150–400)
RBC: 4 MIL/uL (ref 3.87–5.11)
RDW: 13.8 % (ref 11.5–15.5)
WBC: 11.9 10*3/uL — AB (ref 4.0–10.5)

## 2016-11-13 LAB — SURGICAL PCR SCREEN
MRSA, PCR: NEGATIVE
STAPHYLOCOCCUS AUREUS: NEGATIVE

## 2016-11-13 LAB — PROTIME-INR
INR: 1.87
Prothrombin Time: 21.8 seconds — ABNORMAL HIGH (ref 11.4–15.2)

## 2016-11-13 SURGERY — PPM GENERATOR CHANGEOUT

## 2016-11-13 MED ORDER — FENTANYL CITRATE (PF) 100 MCG/2ML IJ SOLN
INTRAMUSCULAR | Status: DC | PRN
  Administered 2016-11-13: 12.5 ug via INTRAVENOUS
  Administered 2016-11-13: 25 ug via INTRAVENOUS

## 2016-11-13 MED ORDER — MUPIROCIN 2 % EX OINT
TOPICAL_OINTMENT | CUTANEOUS | Status: AC
  Administered 2016-11-13: 1 via TOPICAL
  Filled 2016-11-13: qty 22

## 2016-11-13 MED ORDER — MIDAZOLAM HCL 5 MG/5ML IJ SOLN
INTRAMUSCULAR | Status: DC | PRN
  Administered 2016-11-13 (×3): 1 mg via INTRAVENOUS

## 2016-11-13 MED ORDER — MUPIROCIN 2 % EX OINT
1.0000 "application " | TOPICAL_OINTMENT | Freq: Once | CUTANEOUS | Status: AC
  Administered 2016-11-13: 1 via TOPICAL

## 2016-11-13 MED ORDER — CEFAZOLIN SODIUM-DEXTROSE 2-4 GM/100ML-% IV SOLN
INTRAVENOUS | Status: AC
  Filled 2016-11-13: qty 100

## 2016-11-13 MED ORDER — LIDOCAINE HCL (PF) 1 % IJ SOLN
INTRAMUSCULAR | Status: AC
  Filled 2016-11-13: qty 60

## 2016-11-13 MED ORDER — SODIUM CHLORIDE 0.9 % IV SOLN
INTRAVENOUS | Status: DC
  Administered 2016-11-13: 07:00:00 via INTRAVENOUS

## 2016-11-13 MED ORDER — ONDANSETRON HCL 4 MG/2ML IJ SOLN: 4.0000 mg | Freq: Four times a day (QID) | INTRAMUSCULAR | Status: DC | PRN

## 2016-11-13 MED ORDER — SODIUM CHLORIDE 0.9 % IR SOLN
Status: AC
  Filled 2016-11-13: qty 2

## 2016-11-13 MED ORDER — MIDAZOLAM HCL 5 MG/5ML IJ SOLN
INTRAMUSCULAR | Status: AC
  Filled 2016-11-13: qty 5

## 2016-11-13 MED ORDER — FENTANYL CITRATE (PF) 100 MCG/2ML IJ SOLN
INTRAMUSCULAR | Status: AC
  Filled 2016-11-13: qty 2

## 2016-11-13 MED ORDER — SODIUM CHLORIDE 0.9 % IR SOLN
80.0000 mg | Status: AC
  Administered 2016-11-13: 80 mg

## 2016-11-13 MED ORDER — CHLORHEXIDINE GLUCONATE 4 % EX LIQD: 60.0000 mL | Freq: Once | CUTANEOUS | Status: DC

## 2016-11-13 MED ORDER — CEFAZOLIN SODIUM-DEXTROSE 2-4 GM/100ML-% IV SOLN
2.0000 g | INTRAVENOUS | Status: AC
  Administered 2016-11-13: 2 g via INTRAVENOUS

## 2016-11-13 MED ORDER — ACETAMINOPHEN 325 MG PO TABS: 325.0000 mg | ORAL_TABLET | ORAL | Status: DC | PRN

## 2016-11-13 MED ORDER — LIDOCAINE HCL (PF) 1 % IJ SOLN
INTRAMUSCULAR | Status: DC | PRN
  Administered 2016-11-13: 45 mL

## 2016-11-13 SURGICAL SUPPLY — 4 items
CABLE SURGICAL S-101-97-12 (CABLE) ×2 IMPLANT
PACEMAKER ASSURITY DR-RF (Pacemaker) ×2 IMPLANT
PAD DEFIB LIFELINK (PAD) ×2 IMPLANT
TRAY PACEMAKER INSERTION (PACKS) ×2 IMPLANT

## 2016-11-13 NOTE — Discharge Instructions (Signed)
Pacemaker Battery Change, Care After This sheet gives you information about how to care for yourself after your procedure. Your health care provider may also give you more specific instructions. If you have problems or questions, contact your health care provider. What can I expect after the procedure? After your procedure, it is common to have:  Pain or soreness at the site where the pacemaker was inserted.  Swelling at the site where the pacemaker was inserted. Follow these instructions at home: Incision care   Keep the incision clean and dry.  Do not take baths, swim, or use a hot tub until your health care provider approves.     REMOVE OUTER DRESSING TOMORROW; DO NOT GET SITE WET FOR ONE WEEK  Pat the area dry with a clean towel. Do not rub the area. This may cause bleeding.  Follow instructions from your health care provider about how to take care of your incision. Make sure you:  Wash your hands with soap and water before you change your bandage (dressing). If soap and water are not available, use hand sanitizer.  Change your dressing as told by your health care provider.  Leave stitches (sutures), skin glue, or adhesive strips in place. These skin closures may need to stay in place for 2 weeks or longer. If adhesive strip edges start to loosen and curl up, you may trim the loose edges. Do not remove adhesive strips completely unless your health care provider tells you to do that.  Check your incision area every day for signs of infection. Check for:  More redness, swelling, or pain.  More fluid or blood.  Warmth.  Pus or a bad smell. Activity   Do not lift anything that is heavier than 10 lb (4.5 kg) until your health care provider says it is okay to do so.  For the first 2 weeks, or as long as told by your health care provider:  Avoid lifting your left arm higher than your shoulder.  Be gentle when you move your arms over your head. It is okay to raise your arm to  comb your hair.  Avoid strenuous exercise.  Ask your health care provider when it is okay to:  Resume your normal activities.  Return to work or school.  Resume sexual activity. Eating and drinking   Eat a heart-healthy diet. This should include plenty of fresh fruits and vegetables, whole grains, low-fat dairy products, and lean protein like chicken and fish.  Limit alcohol intake to no more than 1 drink a day for non-pregnant women and 2 drinks a day for men. One drink equals 12 oz of beer, 5 oz of wine, or 1 oz of hard liquor.  Check ingredients and nutrition facts on packaged foods and beverages. Avoid the following types of food:  Food that is high in salt (sodium).  Food that is high in saturated fat, like full-fat dairy or red meat.  Food that is high in trans fat, like fried food.  Food and drinks that are high in sugar. Lifestyle   Do not use any products that contain nicotine or tobacco, such as cigarettes and e-cigarettes. If you need help quitting, ask your health care provider.  Take steps to manage and control your weight.  Get regular exercise. Aim for 150 minutes of moderate-intensity exercise (such as walking or yoga) or 75 minutes of vigorous exercise (such as running or swimming) each week.  Manage other health problems, such as diabetes or high blood pressure. Ask  your health care provider how you can manage these conditions. General instructions   Do not drive for 24 hours after your procedure if you were given a medicine to help you relax (sedative).  Take over-the-counter and prescription medicines only as told by your health care provider.  Avoid putting pressure on the area where the pacemaker was placed.  If you need an MRI after your pacemaker has been placed, be sure to tell the health care provider who orders the MRI that you have a pacemaker.  Avoid close and prolonged exposure to electrical devices that have strong magnetic fields. These  include:  Cell phones. Avoid keeping them in a pocket near the pacemaker, and try using the ear opposite the pacemaker.  MP3 players.  Household appliances, like microwaves.  Metal detectors.  Electric generators.  High-tension wires.  Keep all follow-up visits as directed by your health care provider. This is important. Contact a health care provider if:  You have pain at the incision site that is not relieved by over-the-counter or prescription medicines.  You have any of these around your incision site or coming from it:  More redness, swelling, or pain.  Fluid or blood.  Warmth to the touch.  Pus or a bad smell.  You have a fever.  You feel brief, occasional palpitations, light-headedness, or any symptoms that you think might be related to your heart. Get help right away if:  You experience chest pain that is different from the pain at the pacemaker site.  You develop a red streak that extends above or below the incision site.  You experience shortness of breath.  You have palpitations or an irregular heartbeat.  You have light-headedness that does not go away quickly.  You faint or have dizzy spells.  Your pulse suddenly drops or increases rapidly and does not return to normal.  You begin to gain weight and your legs and ankles swell. Summary  After your procedure, it is common to have pain, soreness, and some swelling where the pacemaker was inserted.  Make sure to keep your incision clean and dry. Follow instructions from your health care provider about how to take care of your incision.  Check your incision every day for signs of infection, such as more pain or swelling, pus or a bad smell, warmth, or leaking fluid and blood.  Avoid strenuous exercise and lifting your left arm higher than your shoulder for 2 weeks, or as long as told by your health care provider. This information is not intended to replace advice given to you by your health care  provider. Make sure you discuss any questions you have with your health care provider. Document Released: 06/04/2013 Document Revised: 07/06/2016 Document Reviewed: 07/06/2016 Elsevier Interactive Patient Education  2017 [employer].

## 2016-11-13 NOTE — H&P (Signed)
HPI [name] returns today for followup. She is a very pleasant 83 year old woman with a history of symptomatic bradycardia, status post permanent pacemaker insertion, hypertension, and paroxysmal and now mostly chronic atrial fibrillation. In the interim, she has been stable. She denies chest pain but has had some shortness of breath. No syncope. No peripheral edema. Her atrial lead has an elevated pacing threshold which matters less now that she is in atrial fib. She admits to sodium indiscretion.  No Known Allergies         Current Outpatient Prescriptions  Medication Sig Dispense Refill  . Calcium Carb-Cholecalciferol (CALCIUM 500 +D) 500-400 MG-UNIT TABS Take 1 capsule by mouth daily.    Marland Kitchen DIGOX 125 MCG tablet TAKE 1 TABLET BY MOUTH ONCE A DAY. 30 tablet 11  . fluticasone (FLONASE) 50 MCG/ACT nasal spray USE 1 SPRAY IN EACH NOSTRIL DAILY. 48 g 1  . hydrochlorothiazide (HYDRODIURIL) 25 MG tablet TAKE 1 TABLET BY MOUTH ONCE DAILY. 30 tablet 2  . levothyroxine (SYNTHROID, LEVOTHROID) 112 MCG tablet Take 1 tablet (112 mcg total) by mouth daily. 30 tablet 5  . lovastatin (MEVACOR) 40 MG tablet TAKE ONE TABLET BY MOUTH DAILY. 30 tablet 2  . metoprolol (LOPRESSOR) 100 MG tablet TAKE 1 TABLET BY MOUTH TWICE A DAY. 60 tablet 2  . omeprazole (PRILOSEC) 40 MG capsule TAKE (1) CAPSULE BY MOUTH ONCE DAILY FOR ACID REFLUX. 30 capsule 3  . OXYGEN-HELIUM IN Inhale 2 L into the lungs at bedtime.    . Potassium 99 MG TABS Take 1 tablet by mouth daily.    Marland Kitchen warfarin (COUMADIN) 5 MG tablet Take 1/2 tablet daily except 1 tablet on Wednesdays 45 tablet 3   No current facility-administered medications for this visit.          Past Medical History:  Diagnosis Date  . Arthritis   . Gastroesophageal reflux disease   . Hearing impairment   . Hyperlipidemia   . Hypertension   . Hypothyroidism   . Impaired glucose tolerance   . Macular degeneration   . Pancreatitis   .  Paroxysmal atrial fibrillation ([place]) 2011  . Sick sinus syndrome ([place]) 2011   Atrial fibrilation 10/2009; and dual chamber pacemaker in 4/11; normal EF  . Sleep apnea    Nocturnal oxygen therapy  . Zoster 2016    ROS:   All systems reviewed and negative except as noted in the HPI.        Past Surgical History:  Procedure Laterality Date  . ABDOMINAL HYSTERECTOMY    . CATARACT EXTRACTION     Bilateral  . COLONOSCOPY  2009   Dr. Gala Romney  . EYE SURGERY     laser   . PACEMAKER INSERTION  2011   dual chamber   . SALPINGOOPHORECTOMY  1970   right           Family History  Problem Relation Age of Onset  . Cancer Mother     gential  . Cancer Father     throat   . Pneumonia Sister   . Heart disease    . Arthritis    . Lung disease    . Asthma       Social History        Social History  . Marital status: Widowed    Spouse name: N/A  . Number of children: 7  . Years of education: college       Occupational History  . retired  Retired  Social History Main Topics  . Smoking status: Never Smoker  . Smokeless tobacco: Never Used  . Alcohol use No  . Drug use: No  . Sexual activity: Not on file       Other Topics Concern  . Not on file      Social History Narrative  . No narrative on file     BP 132/72   Pulse 70   Ht 5\' 5"  (1.651 m)   Wt 154 lb (69.9 kg)   SpO2 98%   BMI 25.63 kg/m   Physical Exam:  Well appearing 83 year old woman, NAD HEENT: Unremarkable Neck:  6 cm JVD, no thyromegally Back:  No CVA tenderness Lungs:  Clear with no wheezes, rales, or rhonchi. HEART:  Regular rate rhythm, no murmurs, no rubs, no clicks Abd:  soft, positive bowel sounds, no organomegally, no rebound, no guarding Ext:  2 plus pulses, no edema, no cyanosis, no clubbing Skin:  No rashes no nodules Neuro:  CN II through XII intact, motor grossly intact  DEVICE  Normal device function.  See  PaceArt for details.   Assess/Plan: 1. Atrial fib - she will continue coumadin. She is asymptomatic. Her rate is controlled. 2. PPM - she is at ERI voltage. Will schedule PPM gen change in 8 weeks.  3. Atrial lead dysfunction - she has had an elevated pacing threshold. However, she is now mostly in atrial fib so at this point, I will likely not attempt to place a new atrial lead. 4. HTN - her blood pressure is better. She will continue her current meds and maintain a low sodium diet.  [name].D.

## 2016-11-14 MED FILL — Cefazolin Sodium-Dextrose IV Solution 2 GM/100ML-4%: INTRAVENOUS | Qty: 100 | Status: AC

## 2016-11-14 MED FILL — Gentamicin Sulfate Inj 40 MG/ML: INTRAMUSCULAR | Qty: 2 | Status: AC

## 2016-11-14 MED FILL — Sodium Chloride Irrigation Soln 0.9%: Qty: 500 | Status: AC

## 2016-11-16 ENCOUNTER — Ambulatory Visit: Payer: Self-pay | Admitting: Family Medicine

## 2016-11-16 ENCOUNTER — Telehealth: Payer: Self-pay | Admitting: Cardiology

## 2016-11-16 DIAGNOSIS — Z950 Presence of cardiac pacemaker: Secondary | ICD-10-CM | POA: Diagnosis not present

## 2016-11-16 DIAGNOSIS — M8580 Other specified disorders of bone density and structure, unspecified site: Secondary | ICD-10-CM | POA: Diagnosis not present

## 2016-11-16 DIAGNOSIS — H3530 Unspecified macular degeneration: Secondary | ICD-10-CM | POA: Diagnosis not present

## 2016-11-16 DIAGNOSIS — I10 Essential (primary) hypertension: Secondary | ICD-10-CM | POA: Diagnosis not present

## 2016-11-16 DIAGNOSIS — Z7952 Long term (current) use of systemic steroids: Secondary | ICD-10-CM | POA: Diagnosis not present

## 2016-11-16 DIAGNOSIS — R2681 Unsteadiness on feet: Secondary | ICD-10-CM | POA: Diagnosis not present

## 2016-11-16 DIAGNOSIS — H9190 Unspecified hearing loss, unspecified ear: Secondary | ICD-10-CM | POA: Diagnosis not present

## 2016-11-16 DIAGNOSIS — R296 Repeated falls: Secondary | ICD-10-CM | POA: Diagnosis not present

## 2016-11-16 DIAGNOSIS — Z791 Long term (current) use of non-steroidal anti-inflammatories (NSAID): Secondary | ICD-10-CM | POA: Diagnosis not present

## 2016-11-16 DIAGNOSIS — Z9981 Dependence on supplemental oxygen: Secondary | ICD-10-CM | POA: Diagnosis not present

## 2016-11-16 NOTE — Telephone Encounter (Signed)
Pt daughter called and wanted to know if pt had to come to [facility] office to have her wound check done. I informed pt that she did have to come to [place] office b/c satellite offices are not always staffed with a team member of the [facility]. Pt daughter verbalized understanding. Pt daughter voiced concerns about pt left leg and ankle swelling. After consulting w/ Device Clinic RN I forwarded call to a Triage nurse.

## 2016-11-16 NOTE — Telephone Encounter (Signed)
Call transferred from device clinic to triage stating that the issue is not device related. Daughter [name] on phone (DPR on file) stating that her mother had a generator change on 11/13/16. She states that her mother has been having swelling in her left lower leg and ankle. She states that the patient is not SOB, denies pain, and that the left side is slightly more swollen that the right side. Denies any injury or trauma to the area. She states that she is keeping it elevated. Daughter was advised to continue to monitor the patient and follow up with PCP if this problem continues. Daughter verbalized understanding.

## 2016-11-18 ENCOUNTER — Other Ambulatory Visit: Payer: Self-pay | Admitting: Family Medicine

## 2016-11-21 DIAGNOSIS — H3530 Unspecified macular degeneration: Secondary | ICD-10-CM | POA: Diagnosis not present

## 2016-11-21 DIAGNOSIS — Z791 Long term (current) use of non-steroidal anti-inflammatories (NSAID): Secondary | ICD-10-CM | POA: Diagnosis not present

## 2016-11-21 DIAGNOSIS — R2681 Unsteadiness on feet: Secondary | ICD-10-CM | POA: Diagnosis not present

## 2016-11-21 DIAGNOSIS — M8580 Other specified disorders of bone density and structure, unspecified site: Secondary | ICD-10-CM | POA: Diagnosis not present

## 2016-11-21 DIAGNOSIS — Z9981 Dependence on supplemental oxygen: Secondary | ICD-10-CM | POA: Diagnosis not present

## 2016-11-21 DIAGNOSIS — I10 Essential (primary) hypertension: Secondary | ICD-10-CM | POA: Diagnosis not present

## 2016-11-21 DIAGNOSIS — Z950 Presence of cardiac pacemaker: Secondary | ICD-10-CM | POA: Diagnosis not present

## 2016-11-21 DIAGNOSIS — R296 Repeated falls: Secondary | ICD-10-CM | POA: Diagnosis not present

## 2016-11-21 DIAGNOSIS — H9190 Unspecified hearing loss, unspecified ear: Secondary | ICD-10-CM | POA: Diagnosis not present

## 2016-11-21 DIAGNOSIS — Z7952 Long term (current) use of systemic steroids: Secondary | ICD-10-CM | POA: Diagnosis not present

## 2016-11-22 ENCOUNTER — Ambulatory Visit (INDEPENDENT_AMBULATORY_CARE_PROVIDER_SITE_OTHER): Payer: Medicare Other | Admitting: *Deleted

## 2016-11-22 DIAGNOSIS — E782 Mixed hyperlipidemia: Secondary | ICD-10-CM | POA: Diagnosis not present

## 2016-11-22 DIAGNOSIS — R7301 Impaired fasting glucose: Secondary | ICD-10-CM | POA: Diagnosis not present

## 2016-11-22 DIAGNOSIS — I480 Paroxysmal atrial fibrillation: Secondary | ICD-10-CM

## 2016-11-22 DIAGNOSIS — E038 Other specified hypothyroidism: Secondary | ICD-10-CM | POA: Diagnosis not present

## 2016-11-22 DIAGNOSIS — M8580 Other specified disorders of bone density and structure, unspecified site: Secondary | ICD-10-CM | POA: Diagnosis not present

## 2016-11-22 DIAGNOSIS — I10 Essential (primary) hypertension: Secondary | ICD-10-CM | POA: Diagnosis not present

## 2016-11-22 LAB — COMPLETE METABOLIC PANEL WITHOUT GFR
ALT: 8 U/L (ref 6–29)
AST: 14 U/L (ref 10–35)
Albumin: 3.7 g/dL (ref 3.6–5.1)
Alkaline Phosphatase: 73 U/L (ref 33–130)
BUN: 15 mg/dL (ref 7–25)
CO2: 30 mmol/L (ref 20–31)
Calcium: 9.8 mg/dL (ref 8.6–10.4)
Chloride: 99 mmol/L (ref 98–110)
Creat: 0.95 mg/dL — ABNORMAL HIGH (ref 0.60–0.88)
GFR, Est African American: 61 mL/min
GFR, Est Non African American: 53 mL/min — ABNORMAL LOW
Glucose, Bld: 106 mg/dL — ABNORMAL HIGH (ref 65–99)
Potassium: 3.9 mmol/L (ref 3.5–5.3)
Sodium: 138 mmol/L (ref 135–146)
Total Bilirubin: 0.6 mg/dL (ref 0.2–1.2)
Total Protein: 6.5 g/dL (ref 6.1–8.1)

## 2016-11-22 LAB — POCT INR: INR: 2.6

## 2016-11-22 LAB — LIPID PANEL
Cholesterol: 147 mg/dL
HDL: 39 mg/dL — ABNORMAL LOW
LDL Cholesterol: 55 mg/dL
Total CHOL/HDL Ratio: 3.8 ratio
Triglycerides: 263 mg/dL — ABNORMAL HIGH
VLDL: 53 mg/dL — ABNORMAL HIGH

## 2016-11-23 ENCOUNTER — Ambulatory Visit (INDEPENDENT_AMBULATORY_CARE_PROVIDER_SITE_OTHER): Payer: Medicare Other | Admitting: *Deleted

## 2016-11-23 DIAGNOSIS — I495 Sick sinus syndrome: Secondary | ICD-10-CM

## 2016-11-23 LAB — CUP PACEART INCLINIC DEVICE CHECK
Brady Statistic RV Percent Paced: 73 %
Implantable Lead Implant Date: 20110422
Implantable Pulse Generator Implant Date: 20180319
Lead Channel Pacing Threshold Amplitude: 1 V
Lead Channel Pacing Threshold Amplitude: 1 V
Lead Channel Pacing Threshold Pulse Width: 0.5 ms
Lead Channel Sensing Intrinsic Amplitude: 4.2 mV
Lead Channel Setting Pacing Amplitude: 2 V
MDC IDC LEAD IMPLANT DT: 20110422
MDC IDC LEAD LOCATION: 753859
MDC IDC LEAD LOCATION: 753860
MDC IDC MSMT BATTERY VOLTAGE: 3.16 V
MDC IDC MSMT LEADCHNL RV IMPEDANCE VALUE: 425 Ohm
MDC IDC MSMT LEADCHNL RV PACING THRESHOLD PULSEWIDTH: 0.5 ms
MDC IDC SESS DTM: 20180329124410
MDC IDC SET LEADCHNL RV PACING PULSEWIDTH: 0.5 ms
MDC IDC SET LEADCHNL RV SENSING SENSITIVITY: 1.5 mV
MDC IDC STAT BRADY RA PERCENT PACED: 0 %
Pulse Gen Serial Number: 8010902

## 2016-11-23 LAB — HEMOGLOBIN A1C
HEMOGLOBIN A1C: 5.6 % (ref <5.7)
MEAN PLASMA GLUCOSE: 114 mg/dL

## 2016-11-23 LAB — TSH: TSH: 0.71 m[IU]/L

## 2016-11-23 LAB — VITAMIN D 25 HYDROXY (VIT D DEFICIENCY, FRACTURES): Vit D, 25-Hydroxy: 50 ng/mL (ref 30–100)

## 2016-11-23 NOTE — Progress Notes (Signed)
Wound check appointment. Steri-strips removed. Wound without redness or edema. Incision edges approximated, wound well healed. Normal device function. Threshold, sensing, and impedance consistent with implant measurements. Device programmed at appropriate safety margin. Histogram distribution appropriate for patient and level of activity. RR slope decreased to 8 d/t pt c/o palps with movement. Patient was also encouraged to decrease caffeine intake. No high ventricular rates noted. Patient educated about wound care and arm mobility. ROV in 3 months with GT.

## 2016-11-24 DIAGNOSIS — Z9981 Dependence on supplemental oxygen: Secondary | ICD-10-CM | POA: Diagnosis not present

## 2016-11-24 DIAGNOSIS — I10 Essential (primary) hypertension: Secondary | ICD-10-CM | POA: Diagnosis not present

## 2016-11-24 DIAGNOSIS — M8580 Other specified disorders of bone density and structure, unspecified site: Secondary | ICD-10-CM | POA: Diagnosis not present

## 2016-11-24 DIAGNOSIS — R2681 Unsteadiness on feet: Secondary | ICD-10-CM | POA: Diagnosis not present

## 2016-11-24 DIAGNOSIS — H3530 Unspecified macular degeneration: Secondary | ICD-10-CM | POA: Diagnosis not present

## 2016-11-24 DIAGNOSIS — H9190 Unspecified hearing loss, unspecified ear: Secondary | ICD-10-CM | POA: Diagnosis not present

## 2016-11-24 DIAGNOSIS — Z950 Presence of cardiac pacemaker: Secondary | ICD-10-CM | POA: Diagnosis not present

## 2016-11-24 DIAGNOSIS — R296 Repeated falls: Secondary | ICD-10-CM | POA: Diagnosis not present

## 2016-11-24 DIAGNOSIS — Z7952 Long term (current) use of systemic steroids: Secondary | ICD-10-CM | POA: Diagnosis not present

## 2016-11-24 DIAGNOSIS — Z791 Long term (current) use of non-steroidal anti-inflammatories (NSAID): Secondary | ICD-10-CM | POA: Diagnosis not present

## 2016-11-27 ENCOUNTER — Ambulatory Visit (INDEPENDENT_AMBULATORY_CARE_PROVIDER_SITE_OTHER): Payer: Medicare Other | Admitting: Family Medicine

## 2016-11-27 ENCOUNTER — Telehealth: Payer: Self-pay | Admitting: Family Medicine

## 2016-11-27 ENCOUNTER — Encounter: Payer: Self-pay | Admitting: Family Medicine

## 2016-11-27 VITALS — BP 114/64 | HR 87 | Resp 16 | Ht 65.0 in | Wt 156.0 lb

## 2016-11-27 DIAGNOSIS — E782 Mixed hyperlipidemia: Secondary | ICD-10-CM

## 2016-11-27 DIAGNOSIS — I10 Essential (primary) hypertension: Secondary | ICD-10-CM

## 2016-11-27 DIAGNOSIS — G4734 Idiopathic sleep related nonobstructive alveolar hypoventilation: Secondary | ICD-10-CM | POA: Diagnosis not present

## 2016-11-27 DIAGNOSIS — R296 Repeated falls: Secondary | ICD-10-CM

## 2016-11-27 DIAGNOSIS — H9190 Unspecified hearing loss, unspecified ear: Secondary | ICD-10-CM | POA: Diagnosis not present

## 2016-11-27 DIAGNOSIS — H3530 Unspecified macular degeneration: Secondary | ICD-10-CM | POA: Diagnosis not present

## 2016-11-27 DIAGNOSIS — I480 Paroxysmal atrial fibrillation: Secondary | ICD-10-CM

## 2016-11-27 DIAGNOSIS — Z791 Long term (current) use of non-steroidal anti-inflammatories (NSAID): Secondary | ICD-10-CM | POA: Diagnosis not present

## 2016-11-27 DIAGNOSIS — Z950 Presence of cardiac pacemaker: Secondary | ICD-10-CM | POA: Diagnosis not present

## 2016-11-27 DIAGNOSIS — Z9981 Dependence on supplemental oxygen: Secondary | ICD-10-CM | POA: Diagnosis not present

## 2016-11-27 DIAGNOSIS — E038 Other specified hypothyroidism: Secondary | ICD-10-CM | POA: Diagnosis not present

## 2016-11-27 DIAGNOSIS — M8580 Other specified disorders of bone density and structure, unspecified site: Secondary | ICD-10-CM | POA: Diagnosis not present

## 2016-11-27 DIAGNOSIS — Z7952 Long term (current) use of systemic steroids: Secondary | ICD-10-CM | POA: Diagnosis not present

## 2016-11-27 DIAGNOSIS — R2681 Unsteadiness on feet: Secondary | ICD-10-CM | POA: Diagnosis not present

## 2016-11-27 NOTE — Patient Instructions (Addendum)
Annual physical exam dec 1 or after, call if you need me sooner  You NEED OXYGEN EVERY NIGHT wHEN you are sleeping, Be KIND to yourself and your family  Thanks to in home pT you are improving  PLEASE cut WAY dOWN on butter , cheese and egg yolks, triglycerides are high  cBC, fasting lipid, cmp and EGFR  and tSH in 5 month, 3 weeks  Thank you  for choosing [facility]. We consider it a privelige to serve you.  Delivering excellent health care in a caring and  compassionate way is our goal.  Partnering with you,  so that together we can achieve this goal is our strategy.

## 2016-11-27 NOTE — Telephone Encounter (Signed)
Daughter [name] would like to speak with Dr. Moshe Cipro about her mother.  She would like to have spoken with you today in reference to the patients  Prescriptions.  She needs your advice in managing her mother.  [phone] (landline) will not bed availble from 2:30-3

## 2016-11-28 NOTE — Telephone Encounter (Signed)
I spoke directly with [name], wanted me to know she is now supervising her mother's med administration, she had accidentally taken too much coumadin in recent past and INR was 6 she states, since then she is manageing the meds Also said still not using the oxygen I encouraged her to continue to work on this

## 2016-11-29 DIAGNOSIS — R2681 Unsteadiness on feet: Secondary | ICD-10-CM | POA: Diagnosis not present

## 2016-11-29 DIAGNOSIS — R296 Repeated falls: Secondary | ICD-10-CM | POA: Diagnosis not present

## 2016-11-29 DIAGNOSIS — I10 Essential (primary) hypertension: Secondary | ICD-10-CM | POA: Diagnosis not present

## 2016-11-29 DIAGNOSIS — H3530 Unspecified macular degeneration: Secondary | ICD-10-CM | POA: Diagnosis not present

## 2016-11-29 DIAGNOSIS — Z7952 Long term (current) use of systemic steroids: Secondary | ICD-10-CM | POA: Diagnosis not present

## 2016-11-29 DIAGNOSIS — Z950 Presence of cardiac pacemaker: Secondary | ICD-10-CM | POA: Diagnosis not present

## 2016-11-29 DIAGNOSIS — Z791 Long term (current) use of non-steroidal anti-inflammatories (NSAID): Secondary | ICD-10-CM | POA: Diagnosis not present

## 2016-11-29 DIAGNOSIS — M8580 Other specified disorders of bone density and structure, unspecified site: Secondary | ICD-10-CM | POA: Diagnosis not present

## 2016-11-29 DIAGNOSIS — H9190 Unspecified hearing loss, unspecified ear: Secondary | ICD-10-CM | POA: Diagnosis not present

## 2016-11-29 DIAGNOSIS — Z9981 Dependence on supplemental oxygen: Secondary | ICD-10-CM | POA: Diagnosis not present

## 2016-12-01 DIAGNOSIS — R0902 Hypoxemia: Secondary | ICD-10-CM | POA: Diagnosis not present

## 2016-12-03 DIAGNOSIS — G4734 Idiopathic sleep related nonobstructive alveolar hypoventilation: Secondary | ICD-10-CM | POA: Insufficient documentation

## 2016-12-03 NOTE — Assessment & Plan Note (Signed)
Hyperlipidemia:Low fat diet discussed and encouraged.   Lipid Panel  Lab Results  Component Value Date   CHOL 147 11/22/2016   HDL 39 (L) 11/22/2016   LDLCALC 55 11/22/2016   TRIG 263 (H) 11/22/2016   CHOLHDL 3.8 11/22/2016   Uncontrolled TG, needs to work harder and more consistently on dietary modiciation

## 2016-12-03 NOTE — Assessment & Plan Note (Signed)
Controlled, no change in medication Updated lab needed at/ before next visit.  

## 2016-12-03 NOTE — Assessment & Plan Note (Signed)
Reviewed th need for use of oxygen while sleeping with pt and attempted to engage her in a commitment to following through with nightly use, as she hs established heart and lung disease

## 2016-12-03 NOTE — Assessment & Plan Note (Signed)
Reports improvement in muscle strength and safe independent mobility in the home with help of in home PT , will continue same Home safety reviewed

## 2016-12-03 NOTE — Progress Notes (Signed)
[name]     MRN: [id number]      DOB: Feb 05, 1926   HPI [name] is here for follow up and re-evaluation of chronic medical conditions, medication management and review of any available recent lab and radiology data.  Preventive health is updated, specifically   Immunization.   Questions or concerns regarding consultations or procedures which the PT has had in the interim are  addressed. Very happy with in home PT and noted to be stronger with improved gait, will continue therapy The PT denies any adverse reactions to current medications since the last visit.  Two Daughters  are present. Great concern that she is  still non compliant with reduced fat diet, also not using oxygen at night as she should   ROS Denies recent fever or chills. Denies sinus pressure, nasal congestion, ear pain or sore throat. Denies chest congestion, productive cough or wheezing. Denies chest pains, palpitations and leg swelling Denies abdominal pain, nausea, vomiting,diarrhea or constipation.   Denies dysuria, frequency, hesitancy or incontinence. Improved  joint pain, swelling and limitation in mobility. Denies headaches, seizures, numbness, or tingling. Denies depression, anxiety or insomnia. c/o skin protruding over area where pacemaker recently placed, no tenderness redness or drainage, daughters concerned, pt is not PE  BP 114/64   Pulse 87   Resp 16   Ht 5\' 5"  (1.651 m)   Wt 156 lb (70.8 kg)   SpO2 98%   BMI 25.96 kg/m   Patient alert and oriented and in no cardiopulmonary distress.  HEENT: No facial asymmetry, EOMI,   oropharynx pink and moist.  Neck decreased ROM no JVD, no mass.  Chest: Clear to auscultation bilaterally.decreased air entry throughout CVS: S1, S2 systolic  murmur, no S3.IrRegular rate.Has pacemaker  ABD: Soft non tender.   Ext: No edema  MS: decreased  ROM spine, shoulders, hips and knees.  Skin: Intact, no ulcerations or rash noted.Pacemaker insertion site on  left anterior chest well healed with no sign of infection  Psych: Good eye contact, normal affect. Memory intact not anxious or depressed appearing.  CNS: CN 2-12 intact, power,  normal throughout.no focal deficits noted.   Assessment & Plan  Essential hypertension .conb1 DASH diet and commitment to daily physical activity for a minimum of 30 minutes discussed and encouraged, as a part of hypertension management. The importance of attaining a healthy weight is also discussed.  BP/Weight 11/27/2016 11/13/2016 11/01/2016 10/18/2016 09/11/2016 08/01/2016 42/35/3614  Systolic BP 431 540 086 761 950 932 671  Diastolic BP 64 61 76 68 72 70 80  Wt. (Lbs) 156 153 150 157.08 154 154 154  BMI 25.96 25.46 24.96 26.14 25.63 24.86 24.86       Falls frequently Reports improvement in muscle strength and safe independent mobility in the home with help of in home PT , will continue same Home safety reviewed   Hyperlipidemia Hyperlipidemia:Low fat diet discussed and encouraged.   Lipid Panel  Lab Results  Component Value Date   CHOL 147 11/22/2016   HDL 39 (L) 11/22/2016   LDLCALC 55 11/22/2016   TRIG 263 (H) 11/22/2016   CHOLHDL 3.8 11/22/2016   Uncontrolled TG, needs to work harder and more consistently on dietary modiciation    Hypothyroidism Controlled, no change in medication Updated lab needed at/ before next visit.   Nocturnal hypoxia Reviewed th need for use of oxygen while sleeping with pt and attempted to engage her in a commitment to following through with nightly use,  as she hs established heart and lung disease  Paroxysmal atrial fibrillation (HCC) Rate controlled by pacer,  pacer replaced less than 4 weeks ago and pt is doing well, insertion site well healed

## 2016-12-03 NOTE — Assessment & Plan Note (Signed)
.  conb1 DASH diet and commitment to daily physical activity for a minimum of 30 minutes discussed and encouraged, as a part of hypertension management. The importance of attaining a healthy weight is also discussed.  BP/Weight 11/27/2016 11/13/2016 11/01/2016 10/18/2016 09/11/2016 08/01/2016 26/20/3559  Systolic BP 741 638 453 646 803 212 248  Diastolic BP 64 61 76 68 72 70 80  Wt. (Lbs) 156 153 150 157.08 154 154 154  BMI 25.96 25.46 24.96 26.14 25.63 24.86 24.86

## 2016-12-03 NOTE — Assessment & Plan Note (Signed)
Rate controlled by pacer,  pacer replaced less than 4 weeks ago and pt is doing well, insertion site well healed

## 2016-12-04 ENCOUNTER — Encounter (INDEPENDENT_AMBULATORY_CARE_PROVIDER_SITE_OTHER): Payer: Self-pay | Admitting: Ophthalmology

## 2016-12-04 DIAGNOSIS — Z950 Presence of cardiac pacemaker: Secondary | ICD-10-CM | POA: Diagnosis not present

## 2016-12-04 DIAGNOSIS — R296 Repeated falls: Secondary | ICD-10-CM | POA: Diagnosis not present

## 2016-12-04 DIAGNOSIS — M8580 Other specified disorders of bone density and structure, unspecified site: Secondary | ICD-10-CM | POA: Diagnosis not present

## 2016-12-04 DIAGNOSIS — H3530 Unspecified macular degeneration: Secondary | ICD-10-CM | POA: Diagnosis not present

## 2016-12-04 DIAGNOSIS — H9190 Unspecified hearing loss, unspecified ear: Secondary | ICD-10-CM | POA: Diagnosis not present

## 2016-12-04 DIAGNOSIS — Z7952 Long term (current) use of systemic steroids: Secondary | ICD-10-CM | POA: Diagnosis not present

## 2016-12-04 DIAGNOSIS — Z791 Long term (current) use of non-steroidal anti-inflammatories (NSAID): Secondary | ICD-10-CM | POA: Diagnosis not present

## 2016-12-04 DIAGNOSIS — I10 Essential (primary) hypertension: Secondary | ICD-10-CM | POA: Diagnosis not present

## 2016-12-04 DIAGNOSIS — R2681 Unsteadiness on feet: Secondary | ICD-10-CM | POA: Diagnosis not present

## 2016-12-04 DIAGNOSIS — Z9981 Dependence on supplemental oxygen: Secondary | ICD-10-CM | POA: Diagnosis not present

## 2016-12-06 ENCOUNTER — Other Ambulatory Visit: Payer: Self-pay | Admitting: Cardiology

## 2016-12-06 DIAGNOSIS — Z791 Long term (current) use of non-steroidal anti-inflammatories (NSAID): Secondary | ICD-10-CM | POA: Diagnosis not present

## 2016-12-06 DIAGNOSIS — Z7952 Long term (current) use of systemic steroids: Secondary | ICD-10-CM | POA: Diagnosis not present

## 2016-12-06 DIAGNOSIS — R2681 Unsteadiness on feet: Secondary | ICD-10-CM | POA: Diagnosis not present

## 2016-12-06 DIAGNOSIS — H3530 Unspecified macular degeneration: Secondary | ICD-10-CM | POA: Diagnosis not present

## 2016-12-06 DIAGNOSIS — H9190 Unspecified hearing loss, unspecified ear: Secondary | ICD-10-CM | POA: Diagnosis not present

## 2016-12-06 DIAGNOSIS — M8580 Other specified disorders of bone density and structure, unspecified site: Secondary | ICD-10-CM | POA: Diagnosis not present

## 2016-12-06 DIAGNOSIS — I10 Essential (primary) hypertension: Secondary | ICD-10-CM | POA: Diagnosis not present

## 2016-12-06 DIAGNOSIS — Z950 Presence of cardiac pacemaker: Secondary | ICD-10-CM | POA: Diagnosis not present

## 2016-12-06 DIAGNOSIS — R296 Repeated falls: Secondary | ICD-10-CM | POA: Diagnosis not present

## 2016-12-06 DIAGNOSIS — Z9981 Dependence on supplemental oxygen: Secondary | ICD-10-CM | POA: Diagnosis not present

## 2016-12-11 DIAGNOSIS — H3530 Unspecified macular degeneration: Secondary | ICD-10-CM | POA: Diagnosis not present

## 2016-12-11 DIAGNOSIS — I10 Essential (primary) hypertension: Secondary | ICD-10-CM | POA: Diagnosis not present

## 2016-12-11 DIAGNOSIS — Z7952 Long term (current) use of systemic steroids: Secondary | ICD-10-CM | POA: Diagnosis not present

## 2016-12-11 DIAGNOSIS — Z9981 Dependence on supplemental oxygen: Secondary | ICD-10-CM | POA: Diagnosis not present

## 2016-12-11 DIAGNOSIS — H9190 Unspecified hearing loss, unspecified ear: Secondary | ICD-10-CM | POA: Diagnosis not present

## 2016-12-11 DIAGNOSIS — Z950 Presence of cardiac pacemaker: Secondary | ICD-10-CM | POA: Diagnosis not present

## 2016-12-11 DIAGNOSIS — R296 Repeated falls: Secondary | ICD-10-CM | POA: Diagnosis not present

## 2016-12-11 DIAGNOSIS — Z791 Long term (current) use of non-steroidal anti-inflammatories (NSAID): Secondary | ICD-10-CM | POA: Diagnosis not present

## 2016-12-11 DIAGNOSIS — R2681 Unsteadiness on feet: Secondary | ICD-10-CM | POA: Diagnosis not present

## 2016-12-11 DIAGNOSIS — M8580 Other specified disorders of bone density and structure, unspecified site: Secondary | ICD-10-CM | POA: Diagnosis not present

## 2016-12-13 ENCOUNTER — Ambulatory Visit (INDEPENDENT_AMBULATORY_CARE_PROVIDER_SITE_OTHER): Payer: Medicare Other | Admitting: *Deleted

## 2016-12-13 DIAGNOSIS — H3530 Unspecified macular degeneration: Secondary | ICD-10-CM | POA: Diagnosis not present

## 2016-12-13 DIAGNOSIS — Z791 Long term (current) use of non-steroidal anti-inflammatories (NSAID): Secondary | ICD-10-CM | POA: Diagnosis not present

## 2016-12-13 DIAGNOSIS — I480 Paroxysmal atrial fibrillation: Secondary | ICD-10-CM | POA: Diagnosis not present

## 2016-12-13 DIAGNOSIS — M8580 Other specified disorders of bone density and structure, unspecified site: Secondary | ICD-10-CM | POA: Diagnosis not present

## 2016-12-13 DIAGNOSIS — R2681 Unsteadiness on feet: Secondary | ICD-10-CM | POA: Diagnosis not present

## 2016-12-13 DIAGNOSIS — I10 Essential (primary) hypertension: Secondary | ICD-10-CM | POA: Diagnosis not present

## 2016-12-13 DIAGNOSIS — H9190 Unspecified hearing loss, unspecified ear: Secondary | ICD-10-CM | POA: Diagnosis not present

## 2016-12-13 DIAGNOSIS — Z950 Presence of cardiac pacemaker: Secondary | ICD-10-CM | POA: Diagnosis not present

## 2016-12-13 DIAGNOSIS — R296 Repeated falls: Secondary | ICD-10-CM | POA: Diagnosis not present

## 2016-12-13 DIAGNOSIS — Z9981 Dependence on supplemental oxygen: Secondary | ICD-10-CM | POA: Diagnosis not present

## 2016-12-13 DIAGNOSIS — Z7952 Long term (current) use of systemic steroids: Secondary | ICD-10-CM | POA: Diagnosis not present

## 2016-12-13 LAB — POCT INR: INR: 2.8

## 2016-12-18 ENCOUNTER — Telehealth: Payer: Self-pay | Admitting: Family Medicine

## 2016-12-18 DIAGNOSIS — M8580 Other specified disorders of bone density and structure, unspecified site: Secondary | ICD-10-CM | POA: Diagnosis not present

## 2016-12-18 DIAGNOSIS — I10 Essential (primary) hypertension: Secondary | ICD-10-CM | POA: Diagnosis not present

## 2016-12-18 DIAGNOSIS — R296 Repeated falls: Secondary | ICD-10-CM | POA: Diagnosis not present

## 2016-12-18 DIAGNOSIS — Z950 Presence of cardiac pacemaker: Secondary | ICD-10-CM | POA: Diagnosis not present

## 2016-12-18 DIAGNOSIS — Z791 Long term (current) use of non-steroidal anti-inflammatories (NSAID): Secondary | ICD-10-CM | POA: Diagnosis not present

## 2016-12-18 DIAGNOSIS — Z9981 Dependence on supplemental oxygen: Secondary | ICD-10-CM | POA: Diagnosis not present

## 2016-12-18 DIAGNOSIS — Z7952 Long term (current) use of systemic steroids: Secondary | ICD-10-CM | POA: Diagnosis not present

## 2016-12-18 DIAGNOSIS — R2681 Unsteadiness on feet: Secondary | ICD-10-CM | POA: Diagnosis not present

## 2016-12-18 DIAGNOSIS — H3530 Unspecified macular degeneration: Secondary | ICD-10-CM | POA: Diagnosis not present

## 2016-12-18 DIAGNOSIS — H9190 Unspecified hearing loss, unspecified ear: Secondary | ICD-10-CM | POA: Diagnosis not present

## 2016-12-18 NOTE — Telephone Encounter (Signed)
Family is calling, patient has been congested since her last appt (beginning of April) physical therapist says she is cold and clammy, she is bloated and nauseated, also has swelling in her legs. I made her an appt for wed (that was the first available) Family is requested that she come in tomorrow, because she is not acting like herself.  cb#: [phone]

## 2016-12-19 NOTE — Telephone Encounter (Signed)
Can be seen today in open slot if able

## 2016-12-20 ENCOUNTER — Ambulatory Visit (HOSPITAL_COMMUNITY)
Admission: RE | Admit: 2016-12-20 | Discharge: 2016-12-20 | Disposition: A | Discharge status: Home / Self Care | Payer: Medicare Other | Source: Ambulatory Visit | Attending: Family Medicine | Admitting: Family Medicine

## 2016-12-20 ENCOUNTER — Ambulatory Visit (INDEPENDENT_AMBULATORY_CARE_PROVIDER_SITE_OTHER): Payer: Medicare Other | Admitting: Family Medicine

## 2016-12-20 ENCOUNTER — Encounter: Payer: Self-pay | Admitting: Family Medicine

## 2016-12-20 ENCOUNTER — Encounter (INDEPENDENT_AMBULATORY_CARE_PROVIDER_SITE_OTHER): Payer: Self-pay | Admitting: Ophthalmology

## 2016-12-20 VITALS — BP 120/60 | HR 91 | Resp 16 | Ht 65.0 in | Wt 157.0 lb

## 2016-12-20 DIAGNOSIS — Z791 Long term (current) use of non-steroidal anti-inflammatories (NSAID): Secondary | ICD-10-CM | POA: Diagnosis not present

## 2016-12-20 DIAGNOSIS — J0181 Other acute recurrent sinusitis: Secondary | ICD-10-CM | POA: Insufficient documentation

## 2016-12-20 DIAGNOSIS — Z7952 Long term (current) use of systemic steroids: Secondary | ICD-10-CM | POA: Diagnosis not present

## 2016-12-20 DIAGNOSIS — R0981 Nasal congestion: Secondary | ICD-10-CM | POA: Diagnosis not present

## 2016-12-20 DIAGNOSIS — H3530 Unspecified macular degeneration: Secondary | ICD-10-CM | POA: Diagnosis not present

## 2016-12-20 DIAGNOSIS — R059 Cough, unspecified: Secondary | ICD-10-CM

## 2016-12-20 DIAGNOSIS — R296 Repeated falls: Secondary | ICD-10-CM | POA: Diagnosis not present

## 2016-12-20 DIAGNOSIS — R05 Cough: Secondary | ICD-10-CM

## 2016-12-20 DIAGNOSIS — R2681 Unsteadiness on feet: Secondary | ICD-10-CM | POA: Diagnosis not present

## 2016-12-20 DIAGNOSIS — I10 Essential (primary) hypertension: Secondary | ICD-10-CM

## 2016-12-20 DIAGNOSIS — H9190 Unspecified hearing loss, unspecified ear: Secondary | ICD-10-CM | POA: Diagnosis not present

## 2016-12-20 DIAGNOSIS — Z950 Presence of cardiac pacemaker: Secondary | ICD-10-CM | POA: Diagnosis not present

## 2016-12-20 DIAGNOSIS — Z9981 Dependence on supplemental oxygen: Secondary | ICD-10-CM | POA: Diagnosis not present

## 2016-12-20 DIAGNOSIS — M8580 Other specified disorders of bone density and structure, unspecified site: Secondary | ICD-10-CM | POA: Diagnosis not present

## 2016-12-20 MED ORDER — PENICILLIN V POTASSIUM 500 MG PO TABS: 500.0000 mg | ORAL_TABLET | Freq: Three times a day (TID) | ORAL | 0 refills | Status: DC

## 2016-12-20 NOTE — Patient Instructions (Addendum)
f/u as before  1 week of penicillin prescribed for sinus infection   Please get get Xray of sinus and chest today  Hope you feeel better soon   Sinusitis, Adult Sinusitis is soreness and inflammation of your sinuses. Sinuses are hollow spaces in the bones around your face. They are located:  Around your eyes.  In the middle of your forehead.  Behind your nose.  In your cheekbones. Your sinuses and nasal passages are lined with a stringy fluid (mucus). Mucus normally drains out of your sinuses. When your nasal tissues get inflamed or swollen, the mucus can get trapped or blocked so air cannot flow through your sinuses. This lets bacteria, viruses, and funguses grow, and that leads to infection. Follow these instructions at home: Medicines   Take, use, or apply over-the-counter and prescription medicines only as told by your doctor. These may include nasal sprays.  If you were prescribed an antibiotic medicine, take it as told by your doctor. Do not stop taking the antibiotic even if you start to feel better. Hydrate and Humidify   Drink enough water to keep your pee (urine) clear or pale yellow.  Use a cool mist humidifier to keep the humidity level in your home above 50%.  Breathe in steam for 10-15 minutes, 3-4 times a day or as told by your doctor. You can do this in the bathroom while a hot shower is running.  Try not to spend time in cool or dry air. Rest   Rest as much as possible.  Sleep with your head raised (elevated).  Make sure to get enough sleep each night. General instructions   Put a warm, moist washcloth on your face 3-4 times a day or as told by your doctor. This will help with discomfort.  Wash your hands often with soap and water. If there is no soap and water, use hand sanitizer.  Do not smoke. Avoid being around people who are smoking (secondhand smoke).  Keep all follow-up visits as told by your doctor. This is important. Contact a doctor  if:  You have a fever.  Your symptoms get worse.  Your symptoms do not get better within 10 days. Get help right away if:  You have a very bad headache.  You cannot stop throwing up (vomiting).  You have pain or swelling around your face or eyes.  You have trouble seeing.  You feel confused.  Your neck is stiff.  You have trouble breathing. This information is not intended to replace advice given to you by your health care provider. Make sure you discuss any questions you have with your health care provider. Document Released: 01/31/2008 Document Revised: 04/09/2016 Document Reviewed: 06/09/2015 Elsevier Interactive Patient Education  2017 [employer].

## 2016-12-21 ENCOUNTER — Telehealth: Payer: Self-pay | Admitting: Family Medicine

## 2016-12-21 NOTE — Telephone Encounter (Signed)
Patients daughter is calling to check on patients xray results  Call to discuss [phone]

## 2016-12-22 NOTE — Telephone Encounter (Signed)
Aware of results. 

## 2016-12-23 DIAGNOSIS — R05 Cough: Secondary | ICD-10-CM | POA: Insufficient documentation

## 2016-12-23 DIAGNOSIS — R059 Cough, unspecified: Secondary | ICD-10-CM | POA: Insufficient documentation

## 2016-12-23 NOTE — Assessment & Plan Note (Signed)
Controlled, no change in medication  

## 2016-12-23 NOTE — Progress Notes (Signed)
   [name]     MRN: [id number]      DOB: Jan 21, 1926   HPI [name] is here with a 2 week week h/o sinus pressure and drainage associated with a dry cough. Feels weak and eytes "look weak" per pt and daughters. She denies sore throat or ear pain c/o intermittent chills and low grade fever, believes her sinuses are infected  ROS See HPI  Denies chest pains, palpitations and leg swelling Denies abdominal pain, nausea, vomiting,diarrhea or constipation.   Denies dysuria, frequency, hesitancy or incontinence. Denies joint pain, swelling and limitation in mobility. Denies headaches, seizures, numbness, or tingling. Denies depression, anxiety or insomnia. Denies skin break down or rash.   PE  BP 120/60   Pulse 91   Resp 16   Ht 5\' 5"  (1.651 m)   Wt 157 lb (71.2 kg)   SpO2 98%   BMI 26.13 kg/m   Patient alert and oriented and in no cardiopulmonary distress.  HEENT: No facial asymmetry, EOMI,   oropharynx pink and moist.  Neck supple no JVD, no mass.Maxillary sinus tender TM clear  Chest: Clear to auscultation bilaterally.Decreased though adequate air entry, no crackles or wheezes  CVS: S1, S2 no murmurs, no S3.Regular rate.  ABD: Soft non tender.   Ext: No edema  MS: Decreased  ROM spine, shoulders, hips and knees.  Skin: Intact, no ulcerations or rash noted.  Psych: Good eye contact, normal affect. Memory intact not anxious or depressed appearing.  CNS: CN 2-12 intact, power,  normal throughout.no focal deficits noted.   Assessment & Plan  Acute sinusitis 1 week penicillin prescribed, saline nasal flushes twice daily and x ray sinus today  Cough Increased cough likely allergy based, cXR today  Essential hypertension Controlled, no change in medication

## 2016-12-23 NOTE — Assessment & Plan Note (Signed)
1 week penicillin prescribed, saline nasal flushes twice daily and x ray sinus today

## 2016-12-23 NOTE — Assessment & Plan Note (Signed)
Increased cough likely allergy based, cXR today

## 2016-12-25 DIAGNOSIS — H9190 Unspecified hearing loss, unspecified ear: Secondary | ICD-10-CM | POA: Diagnosis not present

## 2016-12-25 DIAGNOSIS — M8580 Other specified disorders of bone density and structure, unspecified site: Secondary | ICD-10-CM | POA: Diagnosis not present

## 2016-12-25 DIAGNOSIS — H3530 Unspecified macular degeneration: Secondary | ICD-10-CM | POA: Diagnosis not present

## 2016-12-25 DIAGNOSIS — R296 Repeated falls: Secondary | ICD-10-CM | POA: Diagnosis not present

## 2016-12-25 DIAGNOSIS — Z9981 Dependence on supplemental oxygen: Secondary | ICD-10-CM | POA: Diagnosis not present

## 2016-12-25 DIAGNOSIS — Z950 Presence of cardiac pacemaker: Secondary | ICD-10-CM | POA: Diagnosis not present

## 2016-12-25 DIAGNOSIS — Z791 Long term (current) use of non-steroidal anti-inflammatories (NSAID): Secondary | ICD-10-CM | POA: Diagnosis not present

## 2016-12-25 DIAGNOSIS — R2681 Unsteadiness on feet: Secondary | ICD-10-CM | POA: Diagnosis not present

## 2016-12-25 DIAGNOSIS — I10 Essential (primary) hypertension: Secondary | ICD-10-CM | POA: Diagnosis not present

## 2016-12-25 DIAGNOSIS — Z7952 Long term (current) use of systemic steroids: Secondary | ICD-10-CM | POA: Diagnosis not present

## 2016-12-27 DIAGNOSIS — I10 Essential (primary) hypertension: Secondary | ICD-10-CM | POA: Diagnosis not present

## 2016-12-27 DIAGNOSIS — M8580 Other specified disorders of bone density and structure, unspecified site: Secondary | ICD-10-CM | POA: Diagnosis not present

## 2016-12-27 DIAGNOSIS — H9190 Unspecified hearing loss, unspecified ear: Secondary | ICD-10-CM | POA: Diagnosis not present

## 2016-12-27 DIAGNOSIS — Z9981 Dependence on supplemental oxygen: Secondary | ICD-10-CM | POA: Diagnosis not present

## 2016-12-27 DIAGNOSIS — H3530 Unspecified macular degeneration: Secondary | ICD-10-CM | POA: Diagnosis not present

## 2016-12-27 DIAGNOSIS — R296 Repeated falls: Secondary | ICD-10-CM | POA: Diagnosis not present

## 2016-12-27 DIAGNOSIS — R2681 Unsteadiness on feet: Secondary | ICD-10-CM | POA: Diagnosis not present

## 2016-12-27 DIAGNOSIS — Z7952 Long term (current) use of systemic steroids: Secondary | ICD-10-CM | POA: Diagnosis not present

## 2016-12-27 DIAGNOSIS — Z950 Presence of cardiac pacemaker: Secondary | ICD-10-CM | POA: Diagnosis not present

## 2016-12-27 DIAGNOSIS — Z791 Long term (current) use of non-steroidal anti-inflammatories (NSAID): Secondary | ICD-10-CM | POA: Diagnosis not present

## 2016-12-31 DIAGNOSIS — R0902 Hypoxemia: Secondary | ICD-10-CM | POA: Diagnosis not present

## 2017-01-03 ENCOUNTER — Encounter (HOSPITAL_COMMUNITY): Payer: Self-pay | Admitting: Internal Medicine

## 2017-01-10 ENCOUNTER — Ambulatory Visit (INDEPENDENT_AMBULATORY_CARE_PROVIDER_SITE_OTHER): Payer: Medicare Other | Admitting: *Deleted

## 2017-01-10 DIAGNOSIS — I480 Paroxysmal atrial fibrillation: Secondary | ICD-10-CM | POA: Diagnosis not present

## 2017-01-10 LAB — POCT INR: INR: 3

## 2017-01-16 ENCOUNTER — Encounter (INDEPENDENT_AMBULATORY_CARE_PROVIDER_SITE_OTHER): Payer: Medicare Other | Admitting: Ophthalmology

## 2017-01-16 DIAGNOSIS — H353221 Exudative age-related macular degeneration, left eye, with active choroidal neovascularization: Secondary | ICD-10-CM | POA: Diagnosis not present

## 2017-01-16 DIAGNOSIS — I10 Essential (primary) hypertension: Secondary | ICD-10-CM | POA: Diagnosis not present

## 2017-01-16 DIAGNOSIS — H35033 Hypertensive retinopathy, bilateral: Secondary | ICD-10-CM

## 2017-01-16 DIAGNOSIS — H43813 Vitreous degeneration, bilateral: Secondary | ICD-10-CM | POA: Diagnosis not present

## 2017-01-16 DIAGNOSIS — H353114 Nonexudative age-related macular degeneration, right eye, advanced atrophic with subfoveal involvement: Secondary | ICD-10-CM

## 2017-01-27 ENCOUNTER — Other Ambulatory Visit: Payer: Self-pay | Admitting: Cardiology

## 2017-01-31 DIAGNOSIS — R0902 Hypoxemia: Secondary | ICD-10-CM | POA: Diagnosis not present

## 2017-02-05 ENCOUNTER — Other Ambulatory Visit: Payer: Self-pay | Admitting: Family Medicine

## 2017-02-14 ENCOUNTER — Encounter: Payer: Self-pay | Admitting: *Deleted

## 2017-02-15 ENCOUNTER — Encounter: Payer: Self-pay | Admitting: Internal Medicine

## 2017-02-16 ENCOUNTER — Encounter: Payer: Self-pay | Admitting: Internal Medicine

## 2017-02-21 ENCOUNTER — Ambulatory Visit (INDEPENDENT_AMBULATORY_CARE_PROVIDER_SITE_OTHER): Payer: Medicare Other | Admitting: *Deleted

## 2017-02-21 DIAGNOSIS — I480 Paroxysmal atrial fibrillation: Secondary | ICD-10-CM

## 2017-02-21 LAB — POCT INR: INR: 4.6

## 2017-03-02 DIAGNOSIS — R0902 Hypoxemia: Secondary | ICD-10-CM | POA: Diagnosis not present

## 2017-03-07 ENCOUNTER — Ambulatory Visit (INDEPENDENT_AMBULATORY_CARE_PROVIDER_SITE_OTHER): Payer: Medicare Other | Admitting: *Deleted

## 2017-03-07 ENCOUNTER — Ambulatory Visit (INDEPENDENT_AMBULATORY_CARE_PROVIDER_SITE_OTHER): Payer: Medicare Other

## 2017-03-07 DIAGNOSIS — I480 Paroxysmal atrial fibrillation: Secondary | ICD-10-CM

## 2017-03-07 DIAGNOSIS — J0181 Other acute recurrent sinusitis: Secondary | ICD-10-CM

## 2017-03-07 DIAGNOSIS — N3000 Acute cystitis without hematuria: Secondary | ICD-10-CM

## 2017-03-07 LAB — POCT URINALYSIS DIPSTICK
GLUCOSE UA: 100
Nitrite, UA: POSITIVE
Protein, UA: >300
SPEC GRAV UA: 1.015 (ref 1.010–1.025)
UROBILINOGEN UA: 0.2 U/dL
pH, UA: 6 (ref 5.0–8.0)

## 2017-03-07 LAB — POCT INR: INR: 2

## 2017-03-07 MED ORDER — CIPROFLOXACIN HCL 500 MG PO TABS: 500.0000 mg | ORAL_TABLET | Freq: Two times a day (BID) | ORAL | 0 refills | Status: DC

## 2017-03-07 NOTE — Progress Notes (Signed)
Patient aware that med called for per standing order and to call coumadin clinic to make them aware of the antibiotic usage

## 2017-03-09 ENCOUNTER — Emergency Department (HOSPITAL_COMMUNITY): Payer: Medicare Other

## 2017-03-09 ENCOUNTER — Emergency Department (HOSPITAL_COMMUNITY)
Admission: EM | Admit: 2017-03-09 | Discharge: 2017-03-09 | Disposition: A | Discharge status: Home / Self Care | Payer: Medicare Other | Attending: Emergency Medicine | Admitting: Emergency Medicine

## 2017-03-09 ENCOUNTER — Other Ambulatory Visit: Payer: Self-pay | Admitting: Family Medicine

## 2017-03-09 ENCOUNTER — Other Ambulatory Visit: Payer: Self-pay

## 2017-03-09 ENCOUNTER — Encounter (HOSPITAL_COMMUNITY): Payer: Self-pay | Admitting: Emergency Medicine

## 2017-03-09 DIAGNOSIS — Y929 Unspecified place or not applicable: Secondary | ICD-10-CM | POA: Diagnosis not present

## 2017-03-09 DIAGNOSIS — S299XXA Unspecified injury of thorax, initial encounter: Secondary | ICD-10-CM | POA: Diagnosis present

## 2017-03-09 DIAGNOSIS — Y939 Activity, unspecified: Secondary | ICD-10-CM | POA: Insufficient documentation

## 2017-03-09 DIAGNOSIS — W19XXXA Unspecified fall, initial encounter: Secondary | ICD-10-CM | POA: Insufficient documentation

## 2017-03-09 DIAGNOSIS — Z950 Presence of cardiac pacemaker: Secondary | ICD-10-CM | POA: Insufficient documentation

## 2017-03-09 DIAGNOSIS — I10 Essential (primary) hypertension: Secondary | ICD-10-CM | POA: Diagnosis not present

## 2017-03-09 DIAGNOSIS — Z79899 Other long term (current) drug therapy: Secondary | ICD-10-CM | POA: Insufficient documentation

## 2017-03-09 DIAGNOSIS — E039 Hypothyroidism, unspecified: Secondary | ICD-10-CM | POA: Insufficient documentation

## 2017-03-09 DIAGNOSIS — S20419A Abrasion of unspecified back wall of thorax, initial encounter: Secondary | ICD-10-CM | POA: Insufficient documentation

## 2017-03-09 DIAGNOSIS — T07XXXA Unspecified multiple injuries, initial encounter: Secondary | ICD-10-CM

## 2017-03-09 DIAGNOSIS — S3992XA Unspecified injury of lower back, initial encounter: Secondary | ICD-10-CM | POA: Diagnosis not present

## 2017-03-09 DIAGNOSIS — S0990XA Unspecified injury of head, initial encounter: Secondary | ICD-10-CM | POA: Diagnosis not present

## 2017-03-09 DIAGNOSIS — Y999 Unspecified external cause status: Secondary | ICD-10-CM | POA: Insufficient documentation

## 2017-03-09 LAB — URINE CULTURE

## 2017-03-09 MED ORDER — CEPHALEXIN 500 MG PO CAPS: 500.0000 mg | ORAL_CAPSULE | Freq: Three times a day (TID) | ORAL | 0 refills | Status: DC

## 2017-03-09 NOTE — Discharge Instructions (Signed)
Return if any problems.

## 2017-03-09 NOTE — ED Triage Notes (Signed)
Slipped on water at approximately 1200pm and abraded right flank on a bookcase. Denies LOC. No active bleeding. Slight swelling over area.

## 2017-03-09 NOTE — ED Notes (Signed)
Pt denies any other pain.  Only c.o pain to mid back, abrasion not to mid right lateral.  Denies hitting head.  Did not pass out, tripped and fell.

## 2017-03-12 NOTE — ED Provider Notes (Signed)
[facility] DEPT Provider Note   CSN: [id number] Arrival date & time: 03/09/17  1226     History   Chief Complaint Chief Complaint  Patient presents with  . Fall    HPI [name] is a 83 y.o. female.  The history is provided by the patient. No language interpreter was used.  Fall  This is a new problem. The problem occurs constantly. The problem has been gradually worsening. Nothing aggravates the symptoms. Nothing relieves the symptoms. She has tried nothing for the symptoms. The treatment provided no relief.  Pt fell and scratched her back.  Pt's family concerned that she hit her head.  Pt is on coumadin.  Pt reports she slid down a wall and scraped back.  She denies loss of consciousness.    Past Medical History:  Diagnosis Date  . Arthritis   . Gastroesophageal reflux disease   . Hearing impairment   . Hyperlipidemia   . Hypertension   . Hypothyroidism   . Impaired glucose tolerance   . Macular degeneration   . Pancreatitis   . Paroxysmal atrial fibrillation ([place]) 2011  . Sick sinus syndrome ([place]) 2011   Atrial fibrilation 10/2009; and dual chamber pacemaker in 4/11; normal EF  . Sleep apnea    Nocturnal oxygen therapy  . Zoster 2016    Patient Active Problem List   Diagnosis Date Noted  . Cough 12/23/2016  . Nocturnal hypoxia 12/03/2016  . Osteopenia 10/18/2016  . Falls frequently 10/21/2015  . Acute sinusitis 10/21/2015  . Seasonal allergies 05/05/2014  . GERD (gastroesophageal reflux disease) 05/05/2014  . Sick sinus syndrome ([place])   . Chronic anticoagulation 11/18/2010  . Impaired fasting glucose 10/14/2010  . Paroxysmal atrial fibrillation ([place]) 06/14/2010  . 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 Hypothyroidism 02/25/2008  . Hyperlipidemia 02/25/2008  . HEARING LOSS 02/25/2008  . Essential hypertension 02/25/2008    Past Surgical History:  Procedure Laterality Date  . ABDOMINAL HYSTERECTOMY    . CATARACT EXTRACTION     Bilateral  . COLONOSCOPY   2009   Dr. Gala Romney  . EYE SURGERY     laser   . PACEMAKER INSERTION  2011   dual chamber   . PPM GENERATOR CHANGEOUT N/A 11/13/2016   Procedure: PPM Generator Changeout;  Surgeon: Evans Lance, MD;  Location: [facility];  Service: Cardiovascular;  Laterality: N/A;  . SALPINGOOPHORECTOMY  1970   right    OB History    No data available       Home Medications    Prior to Admission medications   Medication Sig Start Date End Date Taking? Authorizing Provider  Calcium Carb-Cholecalciferol (CALCIUM 500 +D) 500-400 MG-UNIT TABS Take 1 capsule by mouth 2 (two) times daily.     [provider]  cephALEXin (KEFLEX) 500 MG capsule Take 1 capsule (500 mg total) by mouth 3 (three) times daily. 03/09/17   Fayrene Helper, MD  ciprofloxacin (CIPRO) 500 MG tablet Take 1 tablet (500 mg total) by mouth 2 (two) times daily. 03/07/17   Fayrene Helper, MD  DIGOX 125 MCG tablet TAKE 1 TABLET BY MOUTH ONCE A DAY. 01/29/17   Arnoldo Lenis, MD  fluticasone (FLONASE) 50 MCG/ACT nasal spray USE 1 SPRAY IN EACH NOSTRIL DAILY. 08/24/16   Fayrene Helper, MD  hydrochlorothiazide (HYDRODIURIL) 25 MG tablet TAKE 1 TABLET BY MOUTH ONCE DAILY. 11/20/16   Fayrene Helper, MD  levothyroxine (SYNTHROID, LEVOTHROID) 112 MCG tablet TAKE 1 TABLET BY MOUTH  ONCE DAILY. 02/05/17   Fayrene Helper, MD  lovastatin (MEVACOR) 40 MG tablet TAKE ONE TABLET BY MOUTH DAILY. 11/20/16   Fayrene Helper, MD  metoprolol (LOPRESSOR) 100 MG tablet TAKE 1 TABLET BY MOUTH TWICE A DAY. 11/20/16   Fayrene Helper, MD  omeprazole (PRILOSEC) 40 MG capsule TAKE (1) CAPSULE BY MOUTH ONCE DAILY FOR ACID REFLUX. 11/20/16   Fayrene Helper, MD  OXYGEN-HELIUM IN Inhale 2 L into the lungs at bedtime.    [provider]  penicillin v potassium (VEETID) 500 MG tablet Take 1 tablet (500 mg total) by mouth 3 (three) times daily. 12/20/16   Fayrene Helper, MD  Potassium 99 MG TABS Take 1 tablet by  mouth daily.    [provider]  warfarin (COUMADIN) 5 MG tablet TAKE 1/2 TABLET BY MOUTH DAILY EXCEPT 1 TABLET ON WEDNESDAYS OR AS DIRECTED. 12/06/16   Arnoldo Lenis, MD    Family History Family History  Problem Relation Age of Onset  . Cancer Mother        gential  . Cancer Father        throat   . Pneumonia Sister   . Emphysema Sister   . Mitral valve prolapse Sister   . Heart disease Unknown   . Arthritis Unknown   . Lung disease Unknown   . Asthma Unknown   . Melanoma Son     Social History Social History  Substance Use Topics  . Smoking status: Never Smoker  . Smokeless tobacco: Never Used  . Alcohol use No     Allergies   Patient has no known allergies.   Review of Systems Review of Systems  All other systems reviewed and are negative.    Physical Exam Updated Vital Signs BP (!) 144/72 (BP Location: Right Arm)   Pulse 71   Temp 98 F (36.7 C) (Oral)   Resp 16   Ht 5\' 5"  (1.651 m)   Wt 70.8 kg (156 lb)   SpO2 100%   BMI 25.96 kg/m   Physical Exam  Constitutional: She appears well-developed and well-nourished.  HENT:  Head: Normocephalic and atraumatic.  Right Ear: External ear normal.  Left Ear: External ear normal.  Nose: Nose normal.  Mouth/Throat: Oropharynx is clear and moist.  Eyes: Pupils are equal, round, and reactive to light. EOM are normal.  Neck: Normal range of motion.  Cardiovascular: Normal rate and regular rhythm.   Pulmonary/Chest: Effort normal.  Abdominal: Soft.  Musculoskeletal: Normal range of motion. She exhibits no tenderness or deformity.  Neurological: She is alert.  Skin: Skin is warm.  Abrasion upper back, no bleeding,  nontender   Psychiatric: She has a normal mood and affect.  Nursing note and vitals reviewed.    ED Treatments / Results  Labs (all labs ordered are listed, but only abnormal results are displayed) Labs Reviewed - No data to display  EKG  EKG Interpretation None        Radiology No results found.  Procedures Procedures (including critical care time)  Medications Ordered in ED Medications - No data to display   Initial Impression / Assessment and Plan / ED Course  I have reviewed the triage vital signs and the nursing notes.  Pertinent labs & imaging results that were available during my care of the patient were reviewed by me and considered in my medical decision making (see chart for details).     Xray no fracture.    Final  Clinical Impressions(s) / ED Diagnoses   Final diagnoses:  Multiple abrasions  Fall, initial encounter    New Prescriptions Discharge Medication List as of 03/09/2017  4:51 PM    START taking these medications   Details  cephALEXin (KEFLEX) 500 MG capsule Take 1 capsule (500 mg total) by mouth 3 (three) times daily., Starting Fri 03/09/2017, Normal      An After Visit Summary was printed and given to the patient.    Fransico Meadow, [place] 03/12/17 [place]    Noemi Chapel, MD 03/23/17 1255

## 2017-03-19 ENCOUNTER — Other Ambulatory Visit: Payer: Self-pay | Admitting: Family Medicine

## 2017-03-21 ENCOUNTER — Other Ambulatory Visit: Payer: Self-pay | Admitting: Family Medicine

## 2017-03-21 NOTE — Telephone Encounter (Signed)
Seen [phone]

## 2017-03-26 ENCOUNTER — Ambulatory Visit (INDEPENDENT_AMBULATORY_CARE_PROVIDER_SITE_OTHER): Payer: Medicare Other | Admitting: Internal Medicine

## 2017-03-26 ENCOUNTER — Encounter: Payer: Self-pay | Admitting: Internal Medicine

## 2017-03-26 VITALS — BP 144/72 | HR 85 | Ht 65.0 in | Wt 156.0 lb

## 2017-03-26 DIAGNOSIS — I4891 Unspecified atrial fibrillation: Secondary | ICD-10-CM | POA: Diagnosis not present

## 2017-03-26 DIAGNOSIS — I10 Essential (primary) hypertension: Secondary | ICD-10-CM

## 2017-03-26 LAB — CUP PACEART INCLINIC DEVICE CHECK
Battery Voltage: 3.02 V
Brady Statistic RA Percent Paced: 0 %
Brady Statistic RV Percent Paced: 81 %
Date Time Interrogation Session: 20180730105029
Implantable Lead Implant Date: 20110422
Implantable Pulse Generator Implant Date: 20180319
Lead Channel Impedance Value: 437.5 Ohm
Lead Channel Pacing Threshold Amplitude: 1 V
Lead Channel Sensing Intrinsic Amplitude: 3.6 mV
Lead Channel Setting Pacing Amplitude: 2 V
Lead Channel Setting Pacing Pulse Width: 0.5 ms
MDC IDC LEAD IMPLANT DT: 20110422
MDC IDC LEAD LOCATION: 753859
MDC IDC LEAD LOCATION: 753860
MDC IDC MSMT BATTERY REMAINING LONGEVITY: 112 mo
MDC IDC MSMT LEADCHNL RV PACING THRESHOLD AMPLITUDE: 1 V
MDC IDC MSMT LEADCHNL RV PACING THRESHOLD PULSEWIDTH: 0.5 ms
MDC IDC MSMT LEADCHNL RV PACING THRESHOLD PULSEWIDTH: 0.5 ms
MDC IDC SET LEADCHNL RV SENSING SENSITIVITY: 1.5 mV
Pulse Gen Serial Number: 8010902

## 2017-03-26 NOTE — Patient Instructions (Signed)
Medication Instructions:  Your physician recommends that you continue on your current medications as directed. Please refer to the Current Medication list given to you today.   Labwork: NONE  Testing/Procedures: NONE  Follow-Up: Your physician wants you to follow-up in: 9 Months with Dr. Lovena Le. You will receive a reminder letter in the mail two months in advance. If you don't receive a letter, please call our office to schedule the follow-up appointment. Remote monitoring is used to monitor your Pacemaker of ICD from home. This monitoring reduces the number of office visits required to check your device to one time per year. It allows [place] to keep an eye on the functioning of your device to ensure it is working properly. You are scheduled for a device check from home on 06/25/17. You may send your transmission at any time that day. If you have a wireless device, the transmission will be sent automatically. After your physician reviews your transmission, you will receive a postcard with your next transmission date.    Any Other Special Instructions Will Be Listed Below (If Applicable).     If you need a refill on your cardiac medications before your next appointment, please call your pharmacy.  Thank you for choosing [facility]!

## 2017-03-26 NOTE — Progress Notes (Signed)
HPI [name] returns today for followup. She is a very pleasant 83 year old woman with a history of symptomatic bradycardia, status post permanent pacemaker insertion, hypertension, and persistent atrial fibrillation. In the interim, she has had one fall but did not injure herself. She denies chest pain but has had some shortness of breath. No syncope. No peripheral edema. She is programmed VVI and is pacing most of the time. She admits to sodium indiscretion.  No Known Allergies   Current Outpatient Prescriptions  Medication Sig Dispense Refill  . Calcium Carb-Cholecalciferol (CALCIUM 500 +D) 500-400 MG-UNIT TABS Take 1 capsule by mouth 2 (two) times daily.     Marland Kitchen DIGOX 125 MCG tablet TAKE 1 TABLET BY MOUTH ONCE A DAY. 30 tablet 3  . fluticasone (FLONASE) 50 MCG/ACT nasal spray USE 1 SPRAY IN EACH NOSTRIL DAILY. 48 g 1  . hydrochlorothiazide (HYDRODIURIL) 25 MG tablet TAKE 1 TABLET BY MOUTH ONCE DAILY. 90 tablet 1  . levothyroxine (SYNTHROID, LEVOTHROID) 112 MCG tablet TAKE 1 TABLET BY MOUTH ONCE DAILY. 90 tablet 1  . lovastatin (MEVACOR) 40 MG tablet TAKE ONE TABLET BY MOUTH DAILY. 90 tablet 0  . metoprolol tartrate (LOPRESSOR) 100 MG tablet TAKE 1 TABLET BY MOUTH TWICE A DAY. 180 tablet 0  . omeprazole (PRILOSEC) 40 MG capsule TAKE (1) CAPSULE BY MOUTH ONCE DAILY FOR ACID REFLUX. 90 capsule 0  . OXYGEN-HELIUM IN Inhale 2 L into the lungs at bedtime.    . Potassium 99 MG TABS Take 1 tablet by mouth daily.    Marland Kitchen warfarin (COUMADIN) 5 MG tablet TAKE 1/2 TABLET BY MOUTH DAILY EXCEPT 1 TABLET ON WEDNESDAYS OR AS DIRECTED. 45 tablet 3   No current facility-administered medications for this visit.      Past Medical History:  Diagnosis Date  . Arthritis   . Gastroesophageal reflux disease   . Hearing impairment   . Hyperlipidemia   . Hypertension   . Hypothyroidism   . Impaired glucose tolerance   . Macular degeneration   . Pancreatitis   . Paroxysmal atrial fibrillation ([place]) 2011  .  Sick sinus syndrome ([place]) 2011   Atrial fibrilation 10/2009; and dual chamber pacemaker in 4/11; normal EF  . Sleep apnea    Nocturnal oxygen therapy  . Zoster 2016    ROS:   All systems reviewed and negative except as noted in the HPI.   Past Surgical History:  Procedure Laterality Date  . ABDOMINAL HYSTERECTOMY    . CATARACT EXTRACTION     Bilateral  . COLONOSCOPY  2009   Dr. Gala Romney  . EYE SURGERY     laser   . PACEMAKER INSERTION  2011   dual chamber   . PPM GENERATOR CHANGEOUT N/A 11/13/2016   Procedure: PPM Generator Changeout;  Surgeon: Evans Lance, MD;  Location: [facility];  Service: Cardiovascular;  Laterality: N/A;  . SALPINGOOPHORECTOMY  1970   right     Family History  Problem Relation Age of Onset  . Cancer Mother        gential  . Cancer Father        throat   . Pneumonia Sister   . Emphysema Sister   . Mitral valve prolapse Sister   . Heart disease Unknown   . Arthritis Unknown   . Lung disease Unknown   . Asthma Unknown   . Melanoma Son      Social History   Social History  . Marital status: Widowed  Spouse name: N/A  . Number of children: 7  . Years of education: college   Occupational History  . retired  Retired   Social History Main Topics  . Smoking status: Never Smoker  . Smokeless tobacco: Never Used  . Alcohol use No  . Drug use: No  . Sexual activity: No   Other Topics Concern  . Not on file   Social History Narrative  . No narrative on file     BP (!) 144/72   Pulse 85   Ht 5\' 5"  (1.651 m)   Wt 156 lb (70.8 kg)   SpO2 96%   BMI 25.96 kg/m   Physical Exam:  Well appearing 83 year old woman, NAD HEENT: Unremarkable Neck:  6 cm JVD, no thyromegally Back:  No CVA tenderness Lungs:  Clear with no wheezes, rales, or rhonchi. HEART:  Regular rate rhythm, no murmurs, no rubs, no clicks Abd:  soft, positive bowel sounds, no organomegally, no rebound, no guarding Ext:  2 plus pulses, no edema, no  cyanosis, no clubbing Skin:  No rashes no nodules Neuro:  CN II through XII intact, motor grossly intact  DEVICE  Normal device function.  See PaceArt for details.   Assess/Plan: 1. Atrial fib - she will continue coumadin. She is asymptomatic. Her rate is controlled. 2. PPM - her St. Jude PPM is working normally (see below). She is pacing VVI 70. 3. Atrial lead dysfunction - she has had an elevated pacing threshold. However, she is now mostly in atrial fib so at this point, I will likely not attempt to place a new atrial lead. 4. HTN - her blood pressure is better. She will continue her current meds and maintain a low sodium diet.  [name].D.

## 2017-04-02 DIAGNOSIS — R0902 Hypoxemia: Secondary | ICD-10-CM | POA: Diagnosis not present

## 2017-04-04 ENCOUNTER — Ambulatory Visit (INDEPENDENT_AMBULATORY_CARE_PROVIDER_SITE_OTHER): Payer: Medicare Other | Admitting: *Deleted

## 2017-04-04 DIAGNOSIS — I480 Paroxysmal atrial fibrillation: Secondary | ICD-10-CM

## 2017-04-04 LAB — POCT INR: INR: 2.3

## 2017-05-02 ENCOUNTER — Ambulatory Visit (INDEPENDENT_AMBULATORY_CARE_PROVIDER_SITE_OTHER): Payer: Medicare Other | Admitting: *Deleted

## 2017-05-02 DIAGNOSIS — I480 Paroxysmal atrial fibrillation: Secondary | ICD-10-CM

## 2017-05-02 DIAGNOSIS — Z5181 Encounter for therapeutic drug level monitoring: Secondary | ICD-10-CM

## 2017-05-02 LAB — POCT INR: INR: 3.5

## 2017-05-03 ENCOUNTER — Telehealth: Payer: Self-pay

## 2017-05-03 ENCOUNTER — Other Ambulatory Visit: Payer: Self-pay | Admitting: *Deleted

## 2017-05-03 DIAGNOSIS — I10 Essential (primary) hypertension: Secondary | ICD-10-CM

## 2017-05-03 DIAGNOSIS — R0902 Hypoxemia: Secondary | ICD-10-CM | POA: Diagnosis not present

## 2017-05-03 DIAGNOSIS — E038 Other specified hypothyroidism: Secondary | ICD-10-CM

## 2017-05-03 DIAGNOSIS — E782 Mixed hyperlipidemia: Secondary | ICD-10-CM

## 2017-05-03 NOTE — Telephone Encounter (Signed)
Please send in lab orders for appt 07/2017

## 2017-05-03 NOTE — Telephone Encounter (Signed)
mailed

## 2017-05-03 NOTE — Progress Notes (Unsigned)
Cbc

## 2017-05-21 ENCOUNTER — Ambulatory Visit (INDEPENDENT_AMBULATORY_CARE_PROVIDER_SITE_OTHER): Payer: Medicare Other

## 2017-05-21 ENCOUNTER — Other Ambulatory Visit: Payer: Self-pay | Admitting: Family Medicine

## 2017-05-21 DIAGNOSIS — R309 Painful micturition, unspecified: Secondary | ICD-10-CM

## 2017-05-21 LAB — POCT URINALYSIS DIPSTICK
Bilirubin, UA: NEGATIVE
GLUCOSE UA: NEGATIVE
Ketones, UA: NEGATIVE
NITRITE UA: POSITIVE
Protein, UA: 30
Spec Grav, UA: 1.015 (ref 1.010–1.025)
UROBILINOGEN UA: 1 U/dL
pH, UA: 7 (ref 5.0–8.0)

## 2017-05-21 MED ORDER — CIPROFLOXACIN HCL 500 MG PO TABS: 500.0000 mg | ORAL_TABLET | Freq: Two times a day (BID) | ORAL | 0 refills | Status: DC

## 2017-05-21 NOTE — Telephone Encounter (Signed)
Seen [phone]

## 2017-05-22 ENCOUNTER — Encounter (INDEPENDENT_AMBULATORY_CARE_PROVIDER_SITE_OTHER): Payer: Medicare Other | Admitting: Ophthalmology

## 2017-05-23 ENCOUNTER — Ambulatory Visit (INDEPENDENT_AMBULATORY_CARE_PROVIDER_SITE_OTHER): Payer: Medicare Other | Admitting: *Deleted

## 2017-05-23 DIAGNOSIS — I480 Paroxysmal atrial fibrillation: Secondary | ICD-10-CM | POA: Diagnosis not present

## 2017-05-23 DIAGNOSIS — Z5181 Encounter for therapeutic drug level monitoring: Secondary | ICD-10-CM

## 2017-05-23 LAB — POCT INR: INR: 5.4

## 2017-05-23 LAB — URINE CULTURE
MICRO NUMBER: 81055256
SPECIMEN QUALITY: ADEQUATE

## 2017-05-24 ENCOUNTER — Other Ambulatory Visit: Payer: Self-pay | Admitting: Cardiology

## 2017-06-02 DIAGNOSIS — R0902 Hypoxemia: Secondary | ICD-10-CM | POA: Diagnosis not present

## 2017-06-04 ENCOUNTER — Ambulatory Visit (INDEPENDENT_AMBULATORY_CARE_PROVIDER_SITE_OTHER): Payer: Medicare Other | Admitting: *Deleted

## 2017-06-04 DIAGNOSIS — I480 Paroxysmal atrial fibrillation: Secondary | ICD-10-CM

## 2017-06-04 DIAGNOSIS — Z5181 Encounter for therapeutic drug level monitoring: Secondary | ICD-10-CM | POA: Diagnosis not present

## 2017-06-04 LAB — POCT INR: INR: 4.6

## 2017-06-13 ENCOUNTER — Ambulatory Visit (INDEPENDENT_AMBULATORY_CARE_PROVIDER_SITE_OTHER): Payer: Medicare Other | Admitting: *Deleted

## 2017-06-13 DIAGNOSIS — Z5181 Encounter for therapeutic drug level monitoring: Secondary | ICD-10-CM

## 2017-06-13 DIAGNOSIS — I480 Paroxysmal atrial fibrillation: Secondary | ICD-10-CM | POA: Diagnosis not present

## 2017-06-13 LAB — POCT INR: INR: 3

## 2017-06-14 ENCOUNTER — Encounter (INDEPENDENT_AMBULATORY_CARE_PROVIDER_SITE_OTHER): Payer: Medicare Other | Admitting: Ophthalmology

## 2017-06-14 DIAGNOSIS — H35033 Hypertensive retinopathy, bilateral: Secondary | ICD-10-CM | POA: Diagnosis not present

## 2017-06-14 DIAGNOSIS — H43813 Vitreous degeneration, bilateral: Secondary | ICD-10-CM

## 2017-06-14 DIAGNOSIS — H353114 Nonexudative age-related macular degeneration, right eye, advanced atrophic with subfoveal involvement: Secondary | ICD-10-CM

## 2017-06-14 DIAGNOSIS — I10 Essential (primary) hypertension: Secondary | ICD-10-CM | POA: Diagnosis not present

## 2017-06-14 DIAGNOSIS — H353221 Exudative age-related macular degeneration, left eye, with active choroidal neovascularization: Secondary | ICD-10-CM

## 2017-06-20 ENCOUNTER — Encounter: Payer: Self-pay | Admitting: Family Medicine

## 2017-06-20 ENCOUNTER — Other Ambulatory Visit: Payer: Self-pay | Admitting: Family Medicine

## 2017-06-20 ENCOUNTER — Ambulatory Visit (INDEPENDENT_AMBULATORY_CARE_PROVIDER_SITE_OTHER): Payer: Medicare Other | Admitting: Family Medicine

## 2017-06-20 VITALS — BP 130/68 | HR 72 | Temp 97.4°F | Resp 20 | Ht 65.0 in | Wt 158.0 lb

## 2017-06-20 DIAGNOSIS — Z7901 Long term (current) use of anticoagulants: Secondary | ICD-10-CM | POA: Diagnosis not present

## 2017-06-20 DIAGNOSIS — R05 Cough: Secondary | ICD-10-CM | POA: Diagnosis not present

## 2017-06-20 DIAGNOSIS — J302 Other seasonal allergic rhinitis: Secondary | ICD-10-CM | POA: Diagnosis not present

## 2017-06-20 DIAGNOSIS — R059 Cough, unspecified: Secondary | ICD-10-CM

## 2017-06-20 MED ORDER — AZITHROMYCIN 250 MG PO TABS: ORAL_TABLET | ORAL | 0 refills | Status: DC

## 2017-06-20 NOTE — Patient Instructions (Signed)
Continue the coricidin as needed Use the nasal spray as needed Push fluids Take the antibiotic as instructed Call if not better in a few days

## 2017-06-20 NOTE — Progress Notes (Signed)
Chief Complaint  Patient presents with  . URI    x 5 days   Patient complains of post nasal drip and runny nose, sinus fullness for 1 weeks ,no improvement.  No fever or chills   No nausea or vomiting.  No chest congestion or wheeze.  Increased cough with sputum.  Drainage is clear, sometimes thick, some blood streaks from nose.     She has been taking Coricidin HBP for a week.  She has been using her nasal sprays intermittently.  She is advised to use them daily. Otherwise is well.  A little more tired.  No mental status changes.  No urinary symptoms.  She is here with her daughter. Is taking daily claritin.  Uses flonase and saline nasal washes as needed,  drinks plenty of water.  Patient Active Problem List   Diagnosis Date Noted  . Cough 12/23/2016  . Nocturnal hypoxia 12/03/2016  . Osteopenia 10/18/2016  . Falls frequently 10/21/2015  . Acute sinusitis 10/21/2015  . Seasonal allergies 05/05/2014  . GERD (gastroesophageal reflux disease) 05/05/2014  . Sick sinus syndrome ([place])   . Chronic anticoagulation 11/18/2010  . Impaired fasting glucose 10/14/2010  . Paroxysmal atrial fibrillation ([place]) 06/14/2010  . 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 Hypothyroidism 02/25/2008  . Hyperlipidemia 02/25/2008  . HEARING LOSS 02/25/2008  . Essential hypertension 02/25/2008    Outpatient Encounter Prescriptions as of 06/20/2017  Medication Sig  . Calcium Carb-Cholecalciferol (CALCIUM 500 +D) 500-400 MG-UNIT TABS Take 1 capsule by mouth 2 (two) times daily.   Marland Kitchen DIGOX 125 MCG tablet TAKE 1 TABLET BY MOUTH ONCE A DAY.  . fluticasone (FLONASE) 50 MCG/ACT nasal spray USE 1 SPRAY IN EACH NOSTRIL DAILY.  . hydrochlorothiazide (HYDRODIURIL) 25 MG tablet TAKE 1 TABLET BY MOUTH ONCE DAILY.  Marland Kitchen levothyroxine (SYNTHROID, LEVOTHROID) 112 MCG tablet TAKE 1 TABLET BY MOUTH ONCE DAILY.  . metoprolol tartrate (LOPRESSOR) 100 MG tablet TAKE 1 TABLET BY MOUTH TWICE A DAY.  Marland Kitchen OXYGEN-HELIUM IN Inhale 2 L into the  lungs at bedtime.  . Potassium 99 MG TABS Take 1 tablet by mouth daily.  Marland Kitchen warfarin (COUMADIN) 5 MG tablet TAKE 1/2 TABLET BY MOUTH DAILY EXCEPT 1 TABLET ON WEDNESDAYS OR AS DIRECTED.  . [DISCONTINUED] ciprofloxacin (CIPRO) 500 MG tablet Take 1 tablet (500 mg total) by mouth 2 (two) times daily.  . [DISCONTINUED] lovastatin (MEVACOR) 40 MG tablet TAKE ONE TABLET BY MOUTH DAILY.  . [DISCONTINUED] omeprazole (PRILOSEC) 40 MG capsule TAKE (1) CAPSULE BY MOUTH ONCE DAILY FOR ACID REFLUX.  Marland Kitchen azithromycin (ZITHROMAX) 250 MG tablet tad   No facility-administered encounter medications on file as of 06/20/2017.     No Known Allergies  Review of Systems  Constitutional: Negative for appetite change, chills and fever.  HENT: Positive for congestion, postnasal drip, rhinorrhea and sinus pressure. Negative for sore throat.   Eyes: Positive for visual disturbance. Negative for redness.  Respiratory: Positive for cough. Negative for shortness of breath and wheezing.   Cardiovascular: Negative for chest pain and palpitations.  Gastrointestinal: Negative for nausea and vomiting.  Musculoskeletal: Negative for back pain and myalgias.  Neurological: Negative for dizziness and headaches.    BP 130/68 (BP Location: Right Arm, Patient Position: Sitting, Cuff Size: Normal)   Pulse 72   Temp (!) 97.4 F (36.3 C) (Temporal)   Resp 20   Ht 5\' 5"  (1.651 m)   Wt 158 lb 0.6 oz (71.7 kg)   SpO2 97%   BMI 26.30 kg/m  Physical Exam  Constitutional: She is oriented to person, place, and time. She appears well-developed and well-nourished. No distress.  HENT:  Head: Normocephalic and atraumatic.  Right Ear: External ear normal.  Left Ear: External ear normal.  Nose: Nose normal.  Mouth/Throat: Oropharynx is clear and moist. No oropharyngeal exudate.  Nasal membranes pale and swollen.  Clear d/c  Eyes: Pupils are equal, round, and reactive to light.  Mild conjunctival injection  Neck: Normal range of  motion.  Cardiovascular: Normal rate.  An irregularly irregular rhythm present.  Pulmonary/Chest: Effort normal. No respiratory distress. She has no wheezes.  Anterior rhonchi no wheeze  Abdominal: Soft. Bowel sounds are normal. There is no tenderness.  Musculoskeletal: Normal range of motion. She exhibits no edema.  Lymphadenopathy:    She has no cervical adenopathy.  Neurological: She is alert and oriented to person, place, and time.  Psychiatric: She has a normal mood and affect. Her behavior is normal. Thought content normal.    ASSESSMENT/PLAN:  1. Cough Discussed that likely started off a virus.  She does have some an ongoing allergies.  Because of persistent symptoms, worsening symptoms, and fatigue I am concerned about a bacterial infection.  She does have underlying COPD.  We will going to cover her with a Z-Pak.  She is advised this can increase her INR.  She is going to go for a recheck of her INR next week.  Call for any worsening symptoms   Patient Instructions  Continue the coricidin as needed Use the nasal spray as needed Push fluids Take the antibiotic as instructed Call if not better in a few days    Raylene Everts, MD

## 2017-06-25 ENCOUNTER — Encounter: Payer: Medicare Other | Admitting: *Deleted

## 2017-06-25 ENCOUNTER — Telehealth: Payer: Self-pay | Admitting: Cardiology

## 2017-06-25 NOTE — Telephone Encounter (Signed)
LMOVM reminding pt to send remote transmission.   

## 2017-06-27 ENCOUNTER — Ambulatory Visit (INDEPENDENT_AMBULATORY_CARE_PROVIDER_SITE_OTHER): Payer: Medicare Other | Admitting: *Deleted

## 2017-06-27 DIAGNOSIS — I480 Paroxysmal atrial fibrillation: Secondary | ICD-10-CM | POA: Diagnosis not present

## 2017-06-27 DIAGNOSIS — Z7901 Long term (current) use of anticoagulants: Secondary | ICD-10-CM | POA: Diagnosis not present

## 2017-06-27 LAB — POCT INR: INR: 1.8

## 2017-06-29 ENCOUNTER — Encounter: Payer: Self-pay | Admitting: Cardiology

## 2017-07-03 DIAGNOSIS — R0902 Hypoxemia: Secondary | ICD-10-CM | POA: Diagnosis not present

## 2017-07-04 ENCOUNTER — Ambulatory Visit (INDEPENDENT_AMBULATORY_CARE_PROVIDER_SITE_OTHER): Payer: Medicare Other

## 2017-07-04 DIAGNOSIS — Z23 Encounter for immunization: Secondary | ICD-10-CM | POA: Diagnosis not present

## 2017-07-10 ENCOUNTER — Telehealth: Payer: Self-pay | Admitting: Cardiology

## 2017-07-10 ENCOUNTER — Ambulatory Visit (INDEPENDENT_AMBULATORY_CARE_PROVIDER_SITE_OTHER): Payer: Medicare Other | Admitting: *Deleted

## 2017-07-10 DIAGNOSIS — I495 Sick sinus syndrome: Secondary | ICD-10-CM | POA: Diagnosis not present

## 2017-07-10 NOTE — Telephone Encounter (Signed)
Verbal instructions provided on how to send manual transmission. Patient's daughter verbalized understanding.

## 2017-07-10 NOTE — Telephone Encounter (Signed)
New Message  [name] call to get assistance sending pt transmission. Please call back to discuss

## 2017-07-11 LAB — CUP PACEART REMOTE DEVICE CHECK
Battery Remaining Percentage: 95.5 %
Battery Voltage: 3.02 V
Brady Statistic RV Percent Paced: 84 %
Implantable Lead Implant Date: 20110422
Implantable Lead Location: 753859
Lead Channel Impedance Value: 450 Ohm
Lead Channel Sensing Intrinsic Amplitude: 3.7 mV
Lead Channel Setting Pacing Amplitude: 2 V
Lead Channel Setting Pacing Pulse Width: 0.5 ms
MDC IDC LEAD IMPLANT DT: 20110422
MDC IDC LEAD LOCATION: 753860
MDC IDC MSMT BATTERY REMAINING LONGEVITY: 114 mo
MDC IDC MSMT LEADCHNL RV PACING THRESHOLD AMPLITUDE: 1 V
MDC IDC MSMT LEADCHNL RV PACING THRESHOLD PULSEWIDTH: 0.5 ms
MDC IDC PG IMPLANT DT: 20180319
MDC IDC PG SERIAL: 8010902
MDC IDC SESS DTM: 20181113204750
MDC IDC SET LEADCHNL RV SENSING SENSITIVITY: 1.5 mV
Pulse Gen Model: 2272

## 2017-07-11 NOTE — Progress Notes (Signed)
Remote pacemaker transmission.   

## 2017-07-13 ENCOUNTER — Encounter: Payer: Self-pay | Admitting: Cardiology

## 2017-07-18 ENCOUNTER — Ambulatory Visit (INDEPENDENT_AMBULATORY_CARE_PROVIDER_SITE_OTHER): Payer: Medicare Other | Admitting: *Deleted

## 2017-07-18 DIAGNOSIS — I480 Paroxysmal atrial fibrillation: Secondary | ICD-10-CM

## 2017-07-18 DIAGNOSIS — Z5181 Encounter for therapeutic drug level monitoring: Secondary | ICD-10-CM

## 2017-07-18 LAB — POCT INR: INR: 3

## 2017-07-20 ENCOUNTER — Other Ambulatory Visit: Payer: Self-pay | Admitting: Family Medicine

## 2017-07-26 ENCOUNTER — Other Ambulatory Visit: Payer: Self-pay | Admitting: Family Medicine

## 2017-07-26 ENCOUNTER — Telehealth: Payer: Self-pay | Admitting: Family Medicine

## 2017-07-26 ENCOUNTER — Ambulatory Visit (INDEPENDENT_AMBULATORY_CARE_PROVIDER_SITE_OTHER): Payer: Medicare Other

## 2017-07-26 ENCOUNTER — Telehealth: Payer: Self-pay

## 2017-07-26 DIAGNOSIS — E782 Mixed hyperlipidemia: Secondary | ICD-10-CM | POA: Diagnosis not present

## 2017-07-26 DIAGNOSIS — I10 Essential (primary) hypertension: Secondary | ICD-10-CM

## 2017-07-26 DIAGNOSIS — R300 Dysuria: Secondary | ICD-10-CM

## 2017-07-26 DIAGNOSIS — E038 Other specified hypothyroidism: Secondary | ICD-10-CM | POA: Diagnosis not present

## 2017-07-26 LAB — POCT URINALYSIS DIPSTICK
BILIRUBIN UA: NEGATIVE
Glucose, UA: NEGATIVE
Ketones, UA: NEGATIVE
NITRITE UA: NEGATIVE
PH UA: 7 (ref 5.0–8.0)
Protein, UA: 100
Spec Grav, UA: 1.015 (ref 1.010–1.025)
Urobilinogen, UA: 1 E.U./dL

## 2017-07-26 MED ORDER — CEPHALEXIN 500 MG PO CAPS: 500.0000 mg | ORAL_CAPSULE | Freq: Three times a day (TID) | ORAL | 0 refills | Status: DC

## 2017-07-26 NOTE — Telephone Encounter (Signed)
Expired orders reordered

## 2017-07-26 NOTE — Telephone Encounter (Signed)
-----   Message from Fayrene Helper, MD sent at 07/26/2017  2:26 PM EST ----- Keflex sent to ca, pls let pt know

## 2017-07-26 NOTE — Telephone Encounter (Signed)
Called daughter, aware.

## 2017-07-26 NOTE — Progress Notes (Signed)
Keflex

## 2017-07-29 LAB — URINE CULTURE
MICRO NUMBER: 81342445
SPECIMEN QUALITY: ADEQUATE

## 2017-07-31 ENCOUNTER — Encounter: Payer: Self-pay | Admitting: Family Medicine

## 2017-07-31 ENCOUNTER — Telehealth: Payer: Self-pay | Admitting: *Deleted

## 2017-07-31 NOTE — Telephone Encounter (Signed)
[name] called stating she has jury duty and cannot bring patient to her appointment today and she will call back to reschedule. Patient would like her lab results. Please call [name] at [phone] and leave a message if she does not answer

## 2017-08-01 ENCOUNTER — Telehealth: Payer: Self-pay | Admitting: *Deleted

## 2017-08-01 NOTE — Telephone Encounter (Signed)
Please call pt's daughter.

## 2017-08-01 NOTE — Telephone Encounter (Signed)
Pt has been started on Keflex for UTI.  Told daughter it is OK for pt to take this antibiotic with coumadin.

## 2017-08-02 ENCOUNTER — Encounter: Payer: Self-pay | Admitting: Family Medicine

## 2017-08-02 DIAGNOSIS — R0902 Hypoxemia: Secondary | ICD-10-CM | POA: Diagnosis not present

## 2017-08-03 ENCOUNTER — Other Ambulatory Visit: Payer: Self-pay | Admitting: Family Medicine

## 2017-08-03 LAB — VITAMIN B12

## 2017-08-03 LAB — CBC
HEMATOCRIT: 31.8 % — AB (ref 35.0–45.0)
HEMOGLOBIN: 10.9 g/dL — AB (ref 11.7–15.5)
MCH: 29.3 pg (ref 27.0–33.0)
MCHC: 34.3 g/dL (ref 32.0–36.0)
MCV: 85.5 fL (ref 80.0–100.0)
MPV: 9.4 fL (ref 7.5–12.5)
Platelets: 303 10*3/uL (ref 140–400)
RBC: 3.72 10*6/uL — ABNORMAL LOW (ref 3.80–5.10)
RDW: 12.8 % (ref 11.0–15.0)
WBC: 9.7 10*3/uL (ref 3.8–10.8)

## 2017-08-03 LAB — COMPLETE METABOLIC PANEL WITH GFR
AG RATIO: 1.3 (calc) (ref 1.0–2.5)
ALKALINE PHOSPHATASE (APISO): 72 U/L (ref 33–130)
ALT: 8 U/L (ref 6–29)
AST: 13 U/L (ref 10–35)
Albumin: 3.8 g/dL (ref 3.6–5.1)
BUN/Creatinine Ratio: 19 (calc) (ref 6–22)
BUN: 17 mg/dL (ref 7–25)
CHLORIDE: 100 mmol/L (ref 98–110)
CO2: 27 mmol/L (ref 20–32)
Calcium: 9.8 mg/dL (ref 8.6–10.4)
Creat: 0.9 mg/dL — ABNORMAL HIGH (ref 0.60–0.88)
GFR, Est African American: 66 mL/min/{1.73_m2} (ref >=60)
GFR, Est Non African American: 57 mL/min/{1.73_m2} — ABNORMAL LOW (ref >=60)
GLOBULIN: 3 g/dL (ref 1.9–3.7)
GLUCOSE: 110 mg/dL — AB (ref 65–99)
POTASSIUM: 4.7 mmol/L (ref 3.5–5.3)
SODIUM: 137 mmol/L (ref 135–146)
Total Bilirubin: 0.5 mg/dL (ref 0.2–1.2)
Total Protein: 6.8 g/dL (ref 6.1–8.1)

## 2017-08-03 LAB — FERRITIN

## 2017-08-03 LAB — LIPID PANEL
CHOL/HDL RATIO: 3.4 (calc) (ref <5.0)
Cholesterol: 128 mg/dL (ref <200)
HDL: 38 mg/dL — AB (ref >50)
LDL Cholesterol (Calc): 64 mg/dL (calc)
NON-HDL CHOLESTEROL (CALC): 90 mg/dL (ref <130)
Triglycerides: 193 mg/dL — ABNORMAL HIGH (ref <150)

## 2017-08-03 LAB — IRON, TOTAL/TOTAL IRON BINDING CAP

## 2017-08-03 LAB — TSH: TSH: 0.82 m[IU]/L (ref 0.40–4.50)

## 2017-08-03 LAB — FOLATE

## 2017-08-03 NOTE — Telephone Encounter (Signed)
[name] aware of urine results and will await results of labs

## 2017-08-15 ENCOUNTER — Ambulatory Visit (INDEPENDENT_AMBULATORY_CARE_PROVIDER_SITE_OTHER): Payer: Medicare Other | Admitting: *Deleted

## 2017-08-15 DIAGNOSIS — Z5181 Encounter for therapeutic drug level monitoring: Secondary | ICD-10-CM | POA: Diagnosis not present

## 2017-08-15 DIAGNOSIS — I480 Paroxysmal atrial fibrillation: Secondary | ICD-10-CM | POA: Diagnosis not present

## 2017-08-15 LAB — POCT INR: INR: 2.9

## 2017-08-16 ENCOUNTER — Ambulatory Visit: Payer: Medicare Other | Admitting: Family Medicine

## 2017-08-30 ENCOUNTER — Ambulatory Visit: Payer: Medicare Other | Admitting: Cardiology

## 2017-08-30 ENCOUNTER — Encounter: Payer: Self-pay | Admitting: Cardiology

## 2017-08-30 VITALS — BP 136/76 | HR 76 | Ht 65.5 in | Wt 162.0 lb

## 2017-08-30 DIAGNOSIS — I480 Paroxysmal atrial fibrillation: Secondary | ICD-10-CM

## 2017-08-30 DIAGNOSIS — R001 Bradycardia, unspecified: Secondary | ICD-10-CM

## 2017-08-30 DIAGNOSIS — E782 Mixed hyperlipidemia: Secondary | ICD-10-CM

## 2017-08-30 DIAGNOSIS — I10 Essential (primary) hypertension: Secondary | ICD-10-CM

## 2017-08-30 MED ORDER — METOPROLOL TARTRATE 100 MG PO TABS: 100.0000 mg | ORAL_TABLET | Freq: Two times a day (BID) | ORAL | 3 refills | Status: DC

## 2017-08-30 MED ORDER — DIGOXIN 125 MCG PO TABS: 125.0000 ug | ORAL_TABLET | Freq: Every day | ORAL | 3 refills | Status: DC

## 2017-08-30 MED ORDER — LOVASTATIN 40 MG PO TABS: 40.0000 mg | ORAL_TABLET | Freq: Every day | ORAL | 3 refills | Status: DC

## 2017-08-30 MED ORDER — HYDROCHLOROTHIAZIDE 25 MG PO TABS: 25.0000 mg | ORAL_TABLET | Freq: Every day | ORAL | 3 refills | Status: DC

## 2017-08-30 NOTE — Progress Notes (Signed)
Clinical Summary [name] is a 84 y.o.female seen today for follow up of the following medical problems   1. Paroxysmal afib - Has not been interested in NOACs.  - no recent palpitations - compliant with meds   2. Symptomatic bradycardia -no recent symptoms, followed in device clinic  3. HTN - remains compliant with meds  4. Hyperlipidemia - she is compliant with meds 06/2017 TC 128 HDL 38 TG 193 LDL 64   Past Medical History:  Diagnosis Date  . Arthritis   . Gastroesophageal reflux disease   . Hearing impairment   . Hyperlipidemia   . Hypertension   . Hypothyroidism   . Impaired glucose tolerance   . Macular degeneration   . Pancreatitis   . Paroxysmal atrial fibrillation ([place]) 2011  . Sick sinus syndrome ([place]) 2011   Atrial fibrilation 10/2009; and dual chamber pacemaker in 4/11; normal EF  . Sleep apnea    Nocturnal oxygen therapy  . Zoster 2016     No Known Allergies   Current Outpatient Medications  Medication Sig Dispense Refill  . azithromycin (ZITHROMAX) 250 MG tablet tad 6 tablet 0  . Calcium Carb-Cholecalciferol (CALCIUM 500 +D) 500-400 MG-UNIT TABS Take 1 capsule by mouth 2 (two) times daily.     . cephALEXin (KEFLEX) 500 MG capsule Take 1 capsule (500 mg total) by mouth 3 (three) times daily. 21 capsule 0  . DIGOX 125 MCG tablet TAKE 1 TABLET BY MOUTH ONCE A DAY. 30 tablet 6  . fluticasone (FLONASE) 50 MCG/ACT nasal spray USE 1 SPRAY IN EACH NOSTRIL DAILY. 48 g 1  . hydrochlorothiazide (HYDRODIURIL) 25 MG tablet TAKE 1 TABLET BY MOUTH ONCE DAILY. 90 tablet 0  . levothyroxine (SYNTHROID, LEVOTHROID) 112 MCG tablet TAKE 1 TABLET BY MOUTH ONCE DAILY. 30 tablet 5  . lovastatin (MEVACOR) 40 MG tablet TAKE ONE TABLET BY MOUTH DAILY. 90 tablet 0  . metoprolol tartrate (LOPRESSOR) 100 MG tablet TAKE 1 TABLET BY MOUTH TWICE A DAY. 180 tablet 0  . omeprazole (PRILOSEC) 40 MG capsule TAKE (1) CAPSULE BY MOUTH ONCE DAILY FOR ACID REFLUX. 90  capsule 0  . OXYGEN-HELIUM IN Inhale 2 L into the lungs at bedtime.    . Potassium 99 MG TABS Take 1 tablet by mouth daily.    Marland Kitchen warfarin (COUMADIN) 5 MG tablet TAKE 1/2 TABLET BY MOUTH DAILY EXCEPT 1 TABLET ON WEDNESDAYS OR AS DIRECTED. 45 tablet 3   No current facility-administered medications for this visit.      Past Surgical History:  Procedure Laterality Date  . ABDOMINAL HYSTERECTOMY    . CATARACT EXTRACTION     Bilateral  . COLONOSCOPY  2009   Dr. Gala Romney  . EYE SURGERY     laser   . PACEMAKER INSERTION  2011   dual chamber   . PPM GENERATOR CHANGEOUT N/A 11/13/2016   Procedure: PPM Generator Changeout;  Surgeon: Evans Lance, MD;  Location: [facility];  Service: Cardiovascular;  Laterality: N/A;  . SALPINGOOPHORECTOMY  1970   right     No Known Allergies    Family History  Problem Relation Age of Onset  . Cancer Mother        gential  . Cancer Father        throat   . Pneumonia Sister   . Emphysema Sister   . Mitral valve prolapse Sister   . Heart disease Unknown   . Arthritis Unknown   . Lung  disease Unknown   . Asthma Unknown   . Melanoma Son      Social History Ms. Kloosterman reports that  has never smoked. she has never used smokeless tobacco. Ms. Tom reports that she does not drink alcohol.   Review of Systems CONSTITUTIONAL: No weight loss, fever, chills, weakness or fatigue.  HEENT: Eyes: No visual loss, blurred vision, double vision or yellow sclerae.No hearing loss, sneezing, congestion, runny nose or sore throat.  SKIN: No rash or itching.  CARDIOVASCULAR: per hpi RESPIRATORY: No shortness of breath, cough or sputum.  GASTROINTESTINAL: No anorexia, nausea, vomiting or diarrhea. No abdominal pain or blood.  GENITOURINARY: No burning on urination, no polyuria NEUROLOGICAL: No headache, dizziness, syncope, paralysis, ataxia, numbness or tingling in the extremities. No change in bowel or bladder control.  MUSCULOSKELETAL: No muscle,  back pain, joint pain or stiffness.  LYMPHATICS: No enlarged nodes. No history of splenectomy.  PSYCHIATRIC: No history of depression or anxiety.  ENDOCRINOLOGIC: No reports of sweating, cold or heat intolerance. No polyuria or polydipsia.  Marland Kitchen   Physical Examination Vitals:   08/30/17 0925  BP: 136/76  Pulse: 76  SpO2: 97%   Vitals:   08/30/17 0925  Weight: 162 lb (73.5 kg)  Height: 5' 5.5" (1.664 m)    Gen: resting comfortably, no acute distress HEENT: no scleral icterus, pupils equal round and reactive, no palptable cervical adenopathy,  CV: RRR, no m/r/g, no jvd Resp: Clear to auscultation bilaterally GI: abdomen is soft, non-tender, non-distended, normal bowel sounds, no hepatosplenomegaly MSK: extremities are warm, no edema.  Skin: warm, no rash Neuro:  no focal deficits Psych: appropriate affect   Diagnostic Studies     Assessment and Plan  1. Afib - no recent symptoms, continue current meds - CHADS2Vasc score is 4, continue anticoag  2. HTN - reasonable control, continue current meds.   3. HL -LDL has been at goal, continue statin. Continue dietary modification to improve TGs  4. Symptomatic bradycardia - no recent symptoms, continue to follow in device clinic      Arnoldo Lenis, M.D.

## 2017-08-30 NOTE — Patient Instructions (Signed)
Medication Instructions:  Your physician recommends that you continue on your current medications as directed. Please refer to the Current Medication list given to you today.   Labwork: NONE   Testing/Procedures: NONE   Follow-Up: Your physician wants you to follow-up in: 6 Months. You will receive a reminder letter in the mail two months in advance. If you don't receive a letter, please call our office to schedule the follow-up appointment.   Any Other Special Instructions Will Be Listed Below (If Applicable).     If you need a refill on your cardiac medications before your next appointment, please call your pharmacy.  Thank you for choosing [facility]!   

## 2017-09-02 DIAGNOSIS — R0902 Hypoxemia: Secondary | ICD-10-CM | POA: Diagnosis not present

## 2017-09-05 ENCOUNTER — Encounter: Payer: Self-pay | Admitting: Cardiology

## 2017-09-12 ENCOUNTER — Ambulatory Visit (INDEPENDENT_AMBULATORY_CARE_PROVIDER_SITE_OTHER): Payer: Medicare Other | Admitting: *Deleted

## 2017-09-12 DIAGNOSIS — I480 Paroxysmal atrial fibrillation: Secondary | ICD-10-CM

## 2017-09-12 DIAGNOSIS — Z5181 Encounter for therapeutic drug level monitoring: Secondary | ICD-10-CM

## 2017-09-12 LAB — POCT INR: INR: 2.6

## 2017-09-12 NOTE — Patient Instructions (Signed)
Continue coumadin 1/2 tablet daily except 1 tablet only on Wednesdays .  Recheck in 6 week  Keep intake of greens consistent 

## 2017-09-19 DIAGNOSIS — J449 Chronic obstructive pulmonary disease, unspecified: Secondary | ICD-10-CM | POA: Diagnosis not present

## 2017-09-19 DIAGNOSIS — I10 Essential (primary) hypertension: Secondary | ICD-10-CM | POA: Diagnosis not present

## 2017-09-19 DIAGNOSIS — J9611 Chronic respiratory failure with hypoxia: Secondary | ICD-10-CM | POA: Diagnosis not present

## 2017-09-19 DIAGNOSIS — G4733 Obstructive sleep apnea (adult) (pediatric): Secondary | ICD-10-CM | POA: Diagnosis not present

## 2017-09-25 ENCOUNTER — Ambulatory Visit: Payer: Medicare Other | Admitting: Family Medicine

## 2017-10-02 ENCOUNTER — Encounter: Payer: Self-pay | Admitting: Family Medicine

## 2017-10-02 ENCOUNTER — Ambulatory Visit (INDEPENDENT_AMBULATORY_CARE_PROVIDER_SITE_OTHER): Payer: Medicare Other | Admitting: Family Medicine

## 2017-10-02 VITALS — BP 120/70 | HR 67 | Resp 16 | Ht 65.5 in | Wt 156.0 lb

## 2017-10-02 DIAGNOSIS — R296 Repeated falls: Secondary | ICD-10-CM | POA: Diagnosis not present

## 2017-10-02 DIAGNOSIS — D539 Nutritional anemia, unspecified: Secondary | ICD-10-CM

## 2017-10-02 DIAGNOSIS — G4734 Idiopathic sleep related nonobstructive alveolar hypoventilation: Secondary | ICD-10-CM | POA: Diagnosis not present

## 2017-10-02 DIAGNOSIS — E785 Hyperlipidemia, unspecified: Secondary | ICD-10-CM | POA: Diagnosis not present

## 2017-10-02 DIAGNOSIS — E038 Other specified hypothyroidism: Secondary | ICD-10-CM

## 2017-10-02 DIAGNOSIS — I10 Essential (primary) hypertension: Secondary | ICD-10-CM

## 2017-10-02 DIAGNOSIS — I480 Paroxysmal atrial fibrillation: Secondary | ICD-10-CM | POA: Diagnosis not present

## 2017-10-02 DIAGNOSIS — R7301 Impaired fasting glucose: Secondary | ICD-10-CM | POA: Diagnosis not present

## 2017-10-02 NOTE — Patient Instructions (Signed)
F/umid to end June, call if you need me before  Please get cBC and anemia panel today  Youi will be referred to the same physical therapist due to unsteady gait noted by your daughter

## 2017-10-03 DIAGNOSIS — R0902 Hypoxemia: Secondary | ICD-10-CM | POA: Diagnosis not present

## 2017-10-03 LAB — CBC
HCT: 33.8 % — ABNORMAL LOW (ref 35.0–45.0)
Hemoglobin: 11.9 g/dL (ref 11.7–15.5)
MCH: 29.8 pg (ref 27.0–33.0)
MCHC: 35.2 g/dL (ref 32.0–36.0)
MCV: 84.5 fL (ref 80.0–100.0)
MPV: 9.1 fL (ref 7.5–12.5)
PLATELETS: 375 10*3/uL (ref 140–400)
RBC: 4 10*6/uL (ref 3.80–5.10)
RDW: 12.5 % (ref 11.0–15.0)
WBC: 15 10*3/uL — ABNORMAL HIGH (ref 3.8–10.8)

## 2017-10-03 LAB — IRON: IRON: 58 ug/dL (ref 45–160)

## 2017-10-03 LAB — VITAMIN B12: Vitamin B-12: 307 pg/mL (ref 200–1100)

## 2017-10-03 LAB — FERRITIN: FERRITIN: 59 ng/mL (ref 20–288)

## 2017-10-03 LAB — FOLATE: Folate: 8 ng/mL

## 2017-10-04 ENCOUNTER — Other Ambulatory Visit: Payer: Self-pay | Admitting: Family Medicine

## 2017-10-04 ENCOUNTER — Other Ambulatory Visit: Payer: Self-pay | Admitting: Cardiology

## 2017-10-07 ENCOUNTER — Encounter: Payer: Self-pay | Admitting: Family Medicine

## 2017-10-07 NOTE — Assessment & Plan Note (Signed)
Rate currently controlled , followed by cardiology, has permanent pacemaker

## 2017-10-07 NOTE — Assessment & Plan Note (Signed)
Controlled, no change in medication  

## 2017-10-07 NOTE — Assessment & Plan Note (Signed)
Hyperlipidemia:Low fat diet discussed and encouraged.   Lipid Panel  Lab Results  Component Value Date   CHOL 128 07/26/2017   HDL 38 (L) 07/26/2017   LDLCALC 55 11/22/2016   TRIG 193 (H) 07/26/2017   CHOLHDL 3.4 07/26/2017     Improved , no med change

## 2017-10-07 NOTE — Assessment & Plan Note (Signed)
Continue nocturnal oxygen 

## 2017-10-07 NOTE — Progress Notes (Signed)
   [name]     MRN: [id number]      DOB: 07-30-1926   HPI [name] is here for follow up and re-evaluation of chronic medical conditions, medication management and review of any available recent lab and radiology data.  The PT denies any adverse reactions to current medications since the last visit.  Daughter is concerned that her Mother is having weakness in right leg and increased gait instability, requests re evaluation for in home PT and wants the same therapist they had last year  ROS Denies recent fever or chills. Denies sinus pressure, nasal congestion, ear pain or sore throat. Denies chest congestion, chronic  cough is unchanged, she denies  wheezing. Denies chest pains, palpitations and leg swelling Denies abdominal pain, nausea, vomiting,diarrhea or constipation.   Denies dysuria, frequency, hesitancy or incontinence. Denies headaches, seizures, numbness, or tingling. Denies depression, anxiety or insomnia. Denies skin break down or rash.   PE  BP 120/70   Pulse 67   Resp 16   Ht 5' 5.5" (1.664 m)   Wt 156 lb (70.8 kg)   SpO2 95%   BMI 25.56 kg/m   Patient alert and oriented and in no cardiopulmonary distress.  HEENT: No facial asymmetry, EOMI,   oropharynx pink and moist.  Neck supple no JVD, no mass.  Chest: Decreased though adequate air entry,  catered crackles , no wheezes  CVS: S1, S2 no murmurs, no S3.Regular rate.  ABD: Soft non tender.   Ext: No edema  MS: Decreased  ROM spine, shoulders, hips and knees.  Skin: Intact, no ulcerations or rash noted.  Psych: Good eye contact, normal affect. Memory intact not anxious or depressed appearing.  CNS: CN 2-12 intact, power,  normal throughout.no focal deficits noted.   Assessment & Plan  Hypothyroidism Controlled, no change in medication   Essential hypertension Controlled, no change in medication   Paroxysmal atrial fibrillation (HCC) Rate currently controlled , followed by cardiology,  has permanent pacemaker  Hyperlipidemia Hyperlipidemia:Low fat diet discussed and encouraged.   Lipid Panel  Lab Results  Component Value Date   CHOL 128 07/26/2017   HDL 38 (L) 07/26/2017   LDLCALC 55 11/22/2016   TRIG 193 (H) 07/26/2017   CHOLHDL 3.4 07/26/2017     Improved , no med change  Falls frequently Increased gait instability noted with right lower extremity weakness per daughter, requests same therapist to do home eval and treatment as has been there in the past will request home eval  Nocturnal hypoxia Continue nocturnal oxygen

## 2017-10-07 NOTE — Assessment & Plan Note (Signed)
Increased gait instability noted with right lower extremity weakness per daughter, requests same therapist to do home eval and treatment as has been there in the past will request home eval

## 2017-10-08 ENCOUNTER — Encounter: Payer: Self-pay | Admitting: Family Medicine

## 2017-10-09 ENCOUNTER — Ambulatory Visit (INDEPENDENT_AMBULATORY_CARE_PROVIDER_SITE_OTHER): Payer: Medicare Other | Admitting: *Deleted

## 2017-10-09 DIAGNOSIS — I495 Sick sinus syndrome: Secondary | ICD-10-CM

## 2017-10-09 NOTE — Progress Notes (Signed)
Remote pacemaker transmission.   

## 2017-10-10 ENCOUNTER — Encounter: Payer: Self-pay | Admitting: Cardiology

## 2017-10-16 LAB — CUP PACEART REMOTE DEVICE CHECK
Brady Statistic RV Percent Paced: 85 %
Date Time Interrogation Session: 20190212161900
Implantable Lead Implant Date: 20110422
Implantable Lead Location: 753859
Lead Channel Impedance Value: 400 Ohm
Lead Channel Pacing Threshold Amplitude: 1 V
Lead Channel Pacing Threshold Pulse Width: 0.5 ms
Lead Channel Sensing Intrinsic Amplitude: 3.6 mV
Lead Channel Setting Pacing Amplitude: 2 V
MDC IDC LEAD IMPLANT DT: 20110422
MDC IDC LEAD LOCATION: 753860
MDC IDC MSMT BATTERY REMAINING LONGEVITY: 110 mo
MDC IDC MSMT BATTERY REMAINING PERCENTAGE: 95.5 %
MDC IDC MSMT BATTERY VOLTAGE: 3.01 V
MDC IDC PG IMPLANT DT: 20180319
MDC IDC SET LEADCHNL RV PACING PULSEWIDTH: 0.5 ms
MDC IDC SET LEADCHNL RV SENSING SENSITIVITY: 1.5 mV
Pulse Gen Model: 2272
Pulse Gen Serial Number: 8010902

## 2017-10-24 ENCOUNTER — Ambulatory Visit (INDEPENDENT_AMBULATORY_CARE_PROVIDER_SITE_OTHER): Payer: Medicare Other | Admitting: *Deleted

## 2017-10-24 DIAGNOSIS — I480 Paroxysmal atrial fibrillation: Secondary | ICD-10-CM

## 2017-10-24 DIAGNOSIS — Z5181 Encounter for therapeutic drug level monitoring: Secondary | ICD-10-CM

## 2017-10-24 LAB — POCT INR: INR: 3

## 2017-10-24 NOTE — Patient Instructions (Signed)
Continue coumadin 1/2 tablet daily except 1 tablet only on Wednesdays .  Recheck in 6 week  Keep intake of greens consistent 

## 2017-10-31 DIAGNOSIS — R0902 Hypoxemia: Secondary | ICD-10-CM | POA: Diagnosis not present

## 2017-11-01 ENCOUNTER — Encounter (INDEPENDENT_AMBULATORY_CARE_PROVIDER_SITE_OTHER): Payer: Self-pay | Admitting: Ophthalmology

## 2017-11-05 ENCOUNTER — Other Ambulatory Visit: Payer: Self-pay | Admitting: Family Medicine

## 2017-11-05 ENCOUNTER — Telehealth: Payer: Self-pay

## 2017-11-05 DIAGNOSIS — R296 Repeated falls: Secondary | ICD-10-CM

## 2017-11-05 NOTE — Telephone Encounter (Signed)
-----   Message from Fayrene Helper, MD sent at 10/07/2017  6:57 PM EST ----- Regarding: plks refer for in home eval for PT twice weekly for 6 weeks C/o unsteady gait and RLE weakness, wants "same therapist" ???Joey, who treated her last year, per daughter See office note please

## 2017-11-14 ENCOUNTER — Encounter (INDEPENDENT_AMBULATORY_CARE_PROVIDER_SITE_OTHER): Payer: Self-pay | Admitting: Ophthalmology

## 2017-11-29 ENCOUNTER — Encounter (INDEPENDENT_AMBULATORY_CARE_PROVIDER_SITE_OTHER): Payer: Medicare Other | Admitting: Ophthalmology

## 2017-11-29 DIAGNOSIS — I10 Essential (primary) hypertension: Secondary | ICD-10-CM | POA: Diagnosis not present

## 2017-11-29 DIAGNOSIS — H35033 Hypertensive retinopathy, bilateral: Secondary | ICD-10-CM

## 2017-11-29 DIAGNOSIS — H353231 Exudative age-related macular degeneration, bilateral, with active choroidal neovascularization: Secondary | ICD-10-CM | POA: Diagnosis not present

## 2017-11-29 DIAGNOSIS — H43813 Vitreous degeneration, bilateral: Secondary | ICD-10-CM | POA: Diagnosis not present

## 2017-12-01 DIAGNOSIS — R0902 Hypoxemia: Secondary | ICD-10-CM | POA: Diagnosis not present

## 2017-12-04 ENCOUNTER — Other Ambulatory Visit: Payer: Self-pay | Admitting: Family Medicine

## 2017-12-05 ENCOUNTER — Ambulatory Visit (INDEPENDENT_AMBULATORY_CARE_PROVIDER_SITE_OTHER): Payer: Medicare Other | Admitting: *Deleted

## 2017-12-05 DIAGNOSIS — Z7901 Long term (current) use of anticoagulants: Secondary | ICD-10-CM | POA: Diagnosis not present

## 2017-12-05 DIAGNOSIS — I480 Paroxysmal atrial fibrillation: Secondary | ICD-10-CM

## 2017-12-05 LAB — POCT INR: INR: 2.7

## 2017-12-05 NOTE — Patient Instructions (Signed)
Continue coumadin 1/2 tablet daily except 1 tablet only on Wednesdays .  Recheck in 6 week  Keep intake of greens consistent 

## 2017-12-31 DIAGNOSIS — R0902 Hypoxemia: Secondary | ICD-10-CM | POA: Diagnosis not present

## 2018-01-02 ENCOUNTER — Other Ambulatory Visit: Payer: Self-pay | Admitting: Family Medicine

## 2018-01-08 ENCOUNTER — Ambulatory Visit (INDEPENDENT_AMBULATORY_CARE_PROVIDER_SITE_OTHER): Payer: Medicare Other | Admitting: *Deleted

## 2018-01-08 DIAGNOSIS — R001 Bradycardia, unspecified: Secondary | ICD-10-CM

## 2018-01-08 DIAGNOSIS — I495 Sick sinus syndrome: Secondary | ICD-10-CM

## 2018-01-09 NOTE — Progress Notes (Signed)
Remote pacemaker transmission.   

## 2018-01-11 ENCOUNTER — Encounter: Payer: Self-pay | Admitting: Cardiology

## 2018-01-15 LAB — CUP PACEART REMOTE DEVICE CHECK
Battery Remaining Longevity: 110 mo
Battery Remaining Percentage: 95.5 %
Battery Voltage: 3.01 V
Date Time Interrogation Session: 20190514153741
Implantable Lead Implant Date: 20110422
Implantable Lead Location: 753859
Implantable Lead Location: 753860
Lead Channel Pacing Threshold Amplitude: 1 V
Lead Channel Sensing Intrinsic Amplitude: 6.6 mV
Lead Channel Setting Pacing Amplitude: 2 V
Lead Channel Setting Pacing Pulse Width: 0.5 ms
Lead Channel Setting Sensing Sensitivity: 1.5 mV
MDC IDC LEAD IMPLANT DT: 20110422
MDC IDC MSMT LEADCHNL RV IMPEDANCE VALUE: 430 Ohm
MDC IDC MSMT LEADCHNL RV PACING THRESHOLD PULSEWIDTH: 0.5 ms
MDC IDC PG IMPLANT DT: 20180319
MDC IDC PG SERIAL: 8010902
MDC IDC STAT BRADY RV PERCENT PACED: 85 %
Pulse Gen Model: 2272

## 2018-01-16 ENCOUNTER — Ambulatory Visit (INDEPENDENT_AMBULATORY_CARE_PROVIDER_SITE_OTHER): Payer: Medicare Other | Admitting: *Deleted

## 2018-01-16 DIAGNOSIS — I480 Paroxysmal atrial fibrillation: Secondary | ICD-10-CM

## 2018-01-16 DIAGNOSIS — Z7901 Long term (current) use of anticoagulants: Secondary | ICD-10-CM | POA: Diagnosis not present

## 2018-01-16 LAB — POCT INR: INR: 2.6 (ref 2.0–3.0)

## 2018-01-16 NOTE — Patient Instructions (Signed)
Continue coumadin 1/2 tablet daily except 1 tablet only on Wednesdays .  Recheck in 6 week  Keep intake of greens consistent 

## 2018-01-30 ENCOUNTER — Ambulatory Visit (INDEPENDENT_AMBULATORY_CARE_PROVIDER_SITE_OTHER): Payer: Medicare Other | Admitting: Internal Medicine

## 2018-01-30 ENCOUNTER — Encounter: Payer: Self-pay | Admitting: *Deleted

## 2018-01-30 VITALS — BP 140/72 | HR 73 | Ht 65.0 in | Wt 156.0 lb

## 2018-01-30 DIAGNOSIS — I482 Chronic atrial fibrillation, unspecified: Secondary | ICD-10-CM

## 2018-01-30 NOTE — Progress Notes (Signed)
HPI [name] returns today for followup. She is a very pleasant 84 year old woman with a history of symptomatic bradycardia, status post permanent pacemaker insertion, hypertension, and persistent atrial fibrillation. In the interim, she has fallen but did not injure herself. She denies chest pain but has had some shortness of breath. No syncope. No peripheral edema. She is programmed VVI and is pacing most of the time. She admits to sodium indiscretion.  No Known Allergies   Current Outpatient Medications  Medication Sig Dispense Refill  . Calcium Carb-Cholecalciferol 500-400 MG-UNIT CHEW One tablet twice daily 60 tablet   . digoxin (DIGOX) 0.125 MG tablet Take 1 tablet (125 mcg total) by mouth daily. 90 tablet 3  . fluticasone (FLONASE) 50 MCG/ACT nasal spray USE 1 SPRAY IN EACH NOSTRIL DAILY. 48 g 1  . hydrochlorothiazide (HYDRODIURIL) 25 MG tablet Take 1 tablet (25 mg total) by mouth daily. 90 tablet 3  . levothyroxine (SYNTHROID, LEVOTHROID) 112 MCG tablet TAKE 1 TABLET BY MOUTH ONCE DAILY. 30 tablet 3  . lovastatin (MEVACOR) 40 MG tablet Take 1 tablet (40 mg total) by mouth daily. 90 tablet 3  . metoprolol tartrate (LOPRESSOR) 100 MG tablet Take 1 tablet (100 mg total) by mouth 2 (two) times daily. 180 tablet 3  . omeprazole (PRILOSEC) 40 MG capsule TAKE (1) CAPSULE BY MOUTH ONCE DAILY FOR ACID REFLUX. 30 capsule 4  . OXYGEN-HELIUM IN Inhale 2 L into the lungs at bedtime.    . Potassium 99 MG TABS Take 1 tablet by mouth daily.    Marland Kitchen warfarin (COUMADIN) 5 MG tablet TAKE 1/2 TABLET BY MOUTH DAILY EXCEPT 1 TABLET ON WEDNESDAYS OR AS DIRECTED. 45 tablet 3   No current facility-administered medications for this visit.      Past Medical History:  Diagnosis Date  . Arthritis   . Gastroesophageal reflux disease   . Hearing impairment   . Hyperlipidemia   . Hypertension   . Hypothyroidism   . Impaired glucose tolerance   . Macular degeneration   . Pancreatitis   .  Paroxysmal atrial fibrillation ([place]) 2011  . Sick sinus syndrome ([place]) 2011   Atrial fibrilation 10/2009; and dual chamber pacemaker in 4/11; normal EF  . Sleep apnea    Nocturnal oxygen therapy  . Zoster 2016    ROS:   All systems reviewed and negative except as noted in the HPI.   Past Surgical History:  Procedure Laterality Date  . ABDOMINAL HYSTERECTOMY    . CATARACT EXTRACTION     Bilateral  . COLONOSCOPY  2009   Dr. Gala Romney  . EYE SURGERY     laser   . PACEMAKER INSERTION  2011   dual chamber   . PPM GENERATOR CHANGEOUT N/A 11/13/2016   Procedure: PPM Generator Changeout;  Surgeon: Evans Lance, MD;  Location: [facility];  Service: Cardiovascular;  Laterality: N/A;  . SALPINGOOPHORECTOMY  1970   right     Family History  Problem Relation Age of Onset  . Cancer Mother        gential  . Cancer Father        throat   . Pneumonia Sister   . Emphysema Sister   . Mitral valve prolapse Sister   . Heart disease Unknown   . Arthritis Unknown   . Lung disease Unknown   . Asthma Unknown   . Melanoma Son      Social History   Socioeconomic History  .  Marital status: Widowed    Spouse name: Not on file  . Number of children: 7  . Years of education: college  . Highest education level: Not on file  Occupational History  . Occupation: retired     Fish farm manager: RETIRED  Social Needs  . Financial resource strain: Not on file  . Food insecurity:    Worry: Not on file    Inability: Not on file  . Transportation needs:    Medical: Not on file    Non-medical: Not on file  Tobacco Use  . Smoking status: Never Smoker  . Smokeless tobacco: Never Used  Substance and Sexual Activity  . Alcohol use: No    Alcohol/week: 0.0 oz  . Drug use: No  . Sexual activity: Never  Lifestyle  . Physical activity:    Days per week: Not on file    Minutes per session: Not on file  . Stress: Not on file  Relationships  . Social connections:    Talks on phone: Not on  file    Gets together: Not on file    Attends religious service: Not on file    Active member of club or organization: Not on file    Attends meetings of clubs or organizations: Not on file    Relationship status: Not on file  . Intimate partner violence:    Fear of current or ex partner: Not on file    Emotionally abused: Not on file    Physically abused: Not on file    Forced sexual activity: Not on file  Other Topics Concern  . Not on file  Social History Narrative  . Not on file     BP 140/72   Pulse 73   Ht 5\' 5"  (1.651 m)   Wt 156 lb (70.8 kg)   SpO2 98%   BMI 25.96 kg/m   Physical Exam:  stable appearing 84 yo woman, NAD HEENT: Unremarkable Neck:  No JVD, no thyromegally Lymphatics:  No adenopathy Back:  No CVA tenderness Lungs:  Clear with no wheezes HEART:  Regular rate rhythm, no murmurs, no rubs, no clicks Abd:  soft, positive bowel sounds, no organomegally, no rebound, no guarding Ext:  2 plus pulses, no edema, no cyanosis, no clubbing Skin:  No rashes no nodules Neuro:  CN II through XII intact, motor grossly intact   DEVICE  Normal device function.  See PaceArt for details.   Assess/Plan: 1. Atrial fib - her ventricular rate is well controlled. She remains on warfarin and I am concerned about falls but also about strokes if we stop the warfarin. I have discussed my concerns with her daughter today and she will continue warfarin for now but if she starts to fall more frequently, then she will need to stop warfarin. 2. PPM - she has a very slow escape rhythm today. We will follow. 3. HTN - her blood pressure is minimally elevated and she will continue her current meds.  Mikle Bosworth.D.

## 2018-01-30 NOTE — Patient Instructions (Signed)
Medication Instructions:  Your physician recommends that you continue on your current medications as directed. Please refer to the Current Medication list given to you today.   Labwork: NONE   Testing/Procedures: NONE   Follow-Up: Your physician wants you to follow-up in: 1 Year with Dr. Taylor. You will receive a reminder letter in the mail two months in advance. If you don't receive a letter, please call our office to schedule the follow-up appointment.   Any Other Special Instructions Will Be Listed Below (If Applicable).     If you need a refill on your cardiac medications before your next appointment, please call your pharmacy.  Thank you for choosing [facility]!   

## 2018-01-31 DIAGNOSIS — R0902 Hypoxemia: Secondary | ICD-10-CM | POA: Diagnosis not present

## 2018-02-19 ENCOUNTER — Ambulatory Visit: Payer: Medicare Other | Admitting: Family Medicine

## 2018-02-27 ENCOUNTER — Ambulatory Visit (INDEPENDENT_AMBULATORY_CARE_PROVIDER_SITE_OTHER): Payer: Medicare Other | Admitting: *Deleted

## 2018-02-27 DIAGNOSIS — Z5181 Encounter for therapeutic drug level monitoring: Secondary | ICD-10-CM | POA: Diagnosis not present

## 2018-02-27 DIAGNOSIS — I480 Paroxysmal atrial fibrillation: Secondary | ICD-10-CM | POA: Diagnosis not present

## 2018-02-27 LAB — POCT INR: INR: 2 (ref 2.0–3.0)

## 2018-02-27 NOTE — Patient Instructions (Signed)
Continue coumadin 1/2 tablet daily except 1 tablet only on Wednesdays .  Recheck in 6 week  Keep intake of greens consistent

## 2018-03-02 DIAGNOSIS — R0902 Hypoxemia: Secondary | ICD-10-CM | POA: Diagnosis not present

## 2018-03-11 ENCOUNTER — Telehealth: Payer: Self-pay | Admitting: Family Medicine

## 2018-03-11 DIAGNOSIS — E785 Hyperlipidemia, unspecified: Secondary | ICD-10-CM

## 2018-03-11 DIAGNOSIS — E559 Vitamin D deficiency, unspecified: Secondary | ICD-10-CM

## 2018-03-11 DIAGNOSIS — I10 Essential (primary) hypertension: Secondary | ICD-10-CM

## 2018-03-11 DIAGNOSIS — E038 Other specified hypothyroidism: Secondary | ICD-10-CM

## 2018-03-11 NOTE — Telephone Encounter (Signed)
Patient daughter came in to reschedule patients appt., patient needs labs, there are no active orders. Cb# [phone]/ [phone] Cecille Rubin)

## 2018-03-12 NOTE — Telephone Encounter (Signed)
Did you want the patient to have labs drawn before next appointment? There was no mention of labs to be drawn prior to next appt on last AVS.

## 2018-03-12 NOTE — Telephone Encounter (Signed)
Yes lb orders from Nov 2018! Needs fasting lipid, cmp and EGFr, TSH, vit D, please

## 2018-03-12 NOTE — Telephone Encounter (Signed)
Labs ordered and patients daughter notified.

## 2018-03-20 DIAGNOSIS — E038 Other specified hypothyroidism: Secondary | ICD-10-CM | POA: Diagnosis not present

## 2018-03-20 DIAGNOSIS — E559 Vitamin D deficiency, unspecified: Secondary | ICD-10-CM | POA: Diagnosis not present

## 2018-03-20 DIAGNOSIS — E785 Hyperlipidemia, unspecified: Secondary | ICD-10-CM | POA: Diagnosis not present

## 2018-03-20 DIAGNOSIS — I10 Essential (primary) hypertension: Secondary | ICD-10-CM | POA: Diagnosis not present

## 2018-03-21 LAB — TSH: TSH: 0.46 mIU/L (ref 0.40–4.50)

## 2018-03-21 LAB — COMPLETE METABOLIC PANEL WITH GFR
AG RATIO: 1.4 (calc) (ref 1.0–2.5)
ALT: 10 U/L (ref 6–29)
AST: 14 U/L (ref 10–35)
Albumin: 4 g/dL (ref 3.6–5.1)
Alkaline phosphatase (APISO): 69 U/L (ref 33–130)
BUN / CREAT RATIO: 20 (calc) (ref 6–22)
BUN: 22 mg/dL (ref 7–25)
CALCIUM: 10 mg/dL (ref 8.6–10.4)
CO2: 31 mmol/L (ref 20–32)
Chloride: 98 mmol/L (ref 98–110)
Creat: 1.08 mg/dL — ABNORMAL HIGH (ref 0.60–0.88)
GFR, EST AFRICAN AMERICAN: 52 mL/min/{1.73_m2} — AB (ref >=60)
GFR, EST NON AFRICAN AMERICAN: 45 mL/min/{1.73_m2} — AB (ref >=60)
GLUCOSE: 108 mg/dL — AB (ref 65–99)
Globulin: 2.8 g/dL (calc) (ref 1.9–3.7)
POTASSIUM: 4.1 mmol/L (ref 3.5–5.3)
Sodium: 136 mmol/L (ref 135–146)
TOTAL PROTEIN: 6.8 g/dL (ref 6.1–8.1)
Total Bilirubin: 0.6 mg/dL (ref 0.2–1.2)

## 2018-03-21 LAB — LIPID PANEL
CHOLESTEROL: 132 mg/dL (ref <200)
HDL: 42 mg/dL — ABNORMAL LOW (ref >50)
LDL Cholesterol (Calc): 63 mg/dL (calc)
Non-HDL Cholesterol (Calc): 90 mg/dL (calc) (ref <130)
Total CHOL/HDL Ratio: 3.1 (calc) (ref <5.0)
Triglycerides: 195 mg/dL — ABNORMAL HIGH (ref <150)

## 2018-03-21 LAB — VITAMIN D 25 HYDROXY (VIT D DEFICIENCY, FRACTURES): VIT D 25 HYDROXY: 63 ng/mL (ref 30–100)

## 2018-03-26 ENCOUNTER — Encounter: Payer: Self-pay | Admitting: Family Medicine

## 2018-03-26 ENCOUNTER — Other Ambulatory Visit: Payer: Self-pay

## 2018-03-26 ENCOUNTER — Ambulatory Visit (INDEPENDENT_AMBULATORY_CARE_PROVIDER_SITE_OTHER): Payer: Medicare Other | Admitting: Family Medicine

## 2018-03-26 VITALS — BP 128/60 | HR 87 | Resp 12 | Ht 66.0 in | Wt 154.1 lb

## 2018-03-26 DIAGNOSIS — E785 Hyperlipidemia, unspecified: Secondary | ICD-10-CM

## 2018-03-26 DIAGNOSIS — Z9181 History of falling: Secondary | ICD-10-CM | POA: Diagnosis not present

## 2018-03-26 DIAGNOSIS — Z7901 Long term (current) use of anticoagulants: Secondary | ICD-10-CM

## 2018-03-26 DIAGNOSIS — J302 Other seasonal allergic rhinitis: Secondary | ICD-10-CM

## 2018-03-26 DIAGNOSIS — E038 Other specified hypothyroidism: Secondary | ICD-10-CM

## 2018-03-26 DIAGNOSIS — I10 Essential (primary) hypertension: Secondary | ICD-10-CM | POA: Diagnosis not present

## 2018-03-26 MED ORDER — PREDNISONE 5 MG PO TABS: ORAL_TABLET | ORAL | 0 refills | Status: DC

## 2018-03-26 NOTE — Patient Instructions (Addendum)
Wellness with nurse in early  October.call if you need me before  Doing very well, no medication changes  Please be careful not to fall  Annual physical exam with MD in December

## 2018-03-30 ENCOUNTER — Encounter: Payer: Self-pay | Admitting: Family Medicine

## 2018-03-30 NOTE — Assessment & Plan Note (Signed)
Increased symptoms x 1 week, 5 day course of prednisone prescribed

## 2018-03-30 NOTE — Assessment & Plan Note (Signed)
No falls in past 8 montes, uses cane or walker, home safety reviewed

## 2018-03-30 NOTE — Assessment & Plan Note (Signed)
Monitored by cardiology, has chronic a fib

## 2018-03-30 NOTE — Assessment & Plan Note (Signed)
Hyperlipidemia:Low fat diet discussed and encouraged.   Lipid Panel  Lab Results  Component Value Date   CHOL 132 03/20/2018   HDL 42 (L) 03/20/2018   LDLCALC 63 03/20/2018   TRIG 195 (H) 03/20/2018   CHOLHDL 3.1 03/20/2018   Improved, no med change

## 2018-03-30 NOTE — Progress Notes (Signed)
   [name]     MRN: [id number]      DOB: 23-Feb-1926   HPI [name] is here for follow up and re-evaluation of chronic medical conditions, medication management and review of any available recent lab and radiology data.  Preventive health is updated, specifically Immunization.   Questions or concerns regarding consultations or procedures which the PT has had in the interim are  addressed. The PT denies any adverse reactions to current medications since the last visit.  C/o increased nasal congestion and post nasal drainage in the past 5 to 8 days, denies fever or chills ROS . Denies s, ear pain or sore throat. Denies chest congestion, productive cough or wheezing. Denies chest pains, palpitations and leg swelling Denies abdominal pain, nausea, vomiting,diarrhea or constipation.   Denies dysuria, frequency, hesitancy or incontinence. C/o chronic joint swelling and limitation in mobility. Denies headaches, seizures, numbness, or tingling. Denies depression, anxiety or insomnia. Denies skin break down or rash.   PE  BP 128/60 (BP Location: Left Arm, Patient Position: Sitting, Cuff Size: Normal)   Pulse 87   Ht 5\' 6"  (1.676 m)   Wt 154 lb 1.9 oz (69.9 kg)   SpO2 96%   BMI 24.88 kg/m   Patient alert and oriented and in no cardiopulmonary distress.  HEENT: No facial asymmetry, EOMI,   oropharynx pink and moist.  Neck decreased ROM no JVD, no mass.  Chest: decreased ir entry, bilateral wheezes , few crackles  CVS: S1, S2 no murmurs, no S3.IrRegular rate.  ABD: Soft non tender.   Ext: No edema  MS: Decreased  ROM spine, shoulders, hips and knees.  Skin: Intact, no ulcerations or rash noted.  Psych: Good eye contact, normal affect. Memory impaired not anxious or depressed appearing.  CNS: CN 2-12 intact,hearing and vision loss,  power, and tone  Slightly decreased throughout   Assessment & Plan  Essential hypertension Controlled, no change in medication DASH diet  and commitment to daily physical activity for a minimum of 30 minutes discussed and encouraged, as a part of hypertension management. The importance of attaining a healthy weight is also discussed.  BP/Weight 03/26/2018 01/30/2018 10/02/2017 08/30/2017 06/20/2017 03/26/2017 0/63/0160  Systolic BP 109 323 557 322 025 427 062  Diastolic BP 60 72 70 76 68 72 72  Wt. (Lbs) 154.12 156 156 162 158.04 156 156  BMI 24.88 25.96 25.56 26.55 26.3 25.96 25.96       Seasonal allergies Increased symptoms x 1 week, 5 day course of prednisone prescribed  Hyperlipidemia Hyperlipidemia:Low fat diet discussed and encouraged.   Lipid Panel  Lab Results  Component Value Date   CHOL 132 03/20/2018   HDL 42 (L) 03/20/2018   LDLCALC 63 03/20/2018   TRIG 195 (H) 03/20/2018   CHOLHDL 3.1 03/20/2018   Improved, no med change   Hypothyroidism Controlled, no change in medication   At high risk for falls No falls in past 8 montes, uses cane or walker, home safety reviewed  Chronic anticoagulation Monitored by cardiology, has chronic a fib

## 2018-03-30 NOTE — Assessment & Plan Note (Signed)
Controlled, no change in medication DASH diet and commitment to daily physical activity for a minimum of 30 minutes discussed and encouraged, as a part of hypertension management. The importance of attaining a healthy weight is also discussed.  BP/Weight 03/26/2018 01/30/2018 10/02/2017 08/30/2017 06/20/2017 03/26/2017 6/80/8811  Systolic BP 031 594 585 929 [phone]  Diastolic BP 60 72 70 76 68 72 72  Wt. (Lbs) 154.12 156 156 162 158.04 156 156  BMI 24.88 25.96 25.56 26.55 26.3 25.96 25.96

## 2018-03-30 NOTE — Assessment & Plan Note (Signed)
Controlled, no change in medication  

## 2018-04-02 DIAGNOSIS — R0902 Hypoxemia: Secondary | ICD-10-CM | POA: Diagnosis not present

## 2018-04-10 ENCOUNTER — Ambulatory Visit (INDEPENDENT_AMBULATORY_CARE_PROVIDER_SITE_OTHER): Payer: Medicare Other | Admitting: *Deleted

## 2018-04-10 ENCOUNTER — Telehealth: Payer: Self-pay | Admitting: Cardiology

## 2018-04-10 DIAGNOSIS — I480 Paroxysmal atrial fibrillation: Secondary | ICD-10-CM | POA: Diagnosis not present

## 2018-04-10 DIAGNOSIS — Z5181 Encounter for therapeutic drug level monitoring: Secondary | ICD-10-CM

## 2018-04-10 DIAGNOSIS — I495 Sick sinus syndrome: Secondary | ICD-10-CM | POA: Diagnosis not present

## 2018-04-10 LAB — POCT INR: INR: 1.5 — AB (ref 2.0–3.0)

## 2018-04-10 NOTE — Telephone Encounter (Signed)
LMOVM reminding pt to send remote transmission.   

## 2018-04-10 NOTE — Patient Instructions (Signed)
Take coumadin 1 1/2 tablets tonight then increase dose to 1/2 tablet daily except 1 tablet only on Wednesdays and Saturdays.  Recheck 8/26 Keep intake of greens consistent

## 2018-04-11 ENCOUNTER — Encounter: Payer: Self-pay | Admitting: Cardiology

## 2018-04-11 NOTE — Progress Notes (Signed)
Remote pacemaker transmission.   

## 2018-04-15 ENCOUNTER — Other Ambulatory Visit: Payer: Self-pay | Admitting: Family Medicine

## 2018-04-18 ENCOUNTER — Encounter (INDEPENDENT_AMBULATORY_CARE_PROVIDER_SITE_OTHER): Payer: Medicare Other | Admitting: Ophthalmology

## 2018-04-22 ENCOUNTER — Ambulatory Visit (INDEPENDENT_AMBULATORY_CARE_PROVIDER_SITE_OTHER): Payer: Medicare Other | Admitting: *Deleted

## 2018-04-22 ENCOUNTER — Encounter: Payer: Self-pay | Admitting: *Deleted

## 2018-04-22 ENCOUNTER — Ambulatory Visit (INDEPENDENT_AMBULATORY_CARE_PROVIDER_SITE_OTHER): Payer: Medicare Other | Admitting: Cardiology

## 2018-04-22 VITALS — BP 118/64 | HR 92 | Ht 66.0 in | Wt 153.0 lb

## 2018-04-22 DIAGNOSIS — E782 Mixed hyperlipidemia: Secondary | ICD-10-CM | POA: Diagnosis not present

## 2018-04-22 DIAGNOSIS — R001 Bradycardia, unspecified: Secondary | ICD-10-CM

## 2018-04-22 DIAGNOSIS — I10 Essential (primary) hypertension: Secondary | ICD-10-CM | POA: Diagnosis not present

## 2018-04-22 DIAGNOSIS — I480 Paroxysmal atrial fibrillation: Secondary | ICD-10-CM | POA: Diagnosis not present

## 2018-04-22 DIAGNOSIS — I4891 Unspecified atrial fibrillation: Secondary | ICD-10-CM

## 2018-04-22 LAB — POCT INR: INR: 2.6 (ref 2.0–3.0)

## 2018-04-22 NOTE — Progress Notes (Signed)
- no     Clinical Summary Ms. Lippy is a 84 y.o.female seen today for follow up of the following medical problems   1. Paroxysmal afib - Has not been interested in NOACs.  - no recent palpitations. She is compliant with meds. No bleeding issues on coumadin.   2. Symptomatic bradycardia  03/2018 normal device check. No symptoms   3. HTN - compliant wihth meds  4. Hyperlipidemia 06/2017 TC 128 HDL 38 TG 193 LDL 64 - 02/2018 TC 132 HDL 42 TG 195 LDL 63   - compliant with meds   SH: daughter of [name] who is also a patient of mine. She has a great grandsone here with her today started kindergarten, a great grandaughter who is 32 yos  Past Medical History:  Diagnosis Date  . Arthritis   . Gastroesophageal reflux disease   . Hearing impairment   . Hyperlipidemia   . Hypertension   . Hypothyroidism   . Impaired glucose tolerance   . Macular degeneration   . Pancreatitis   . Paroxysmal atrial fibrillation ([place]) 2011  . Sick sinus syndrome ([place]) 2011   Atrial fibrilation 10/2009; and dual chamber pacemaker in 4/11; normal EF  . Sleep apnea    Nocturnal oxygen therapy  . Zoster 2016     No Known Allergies   Current Outpatient Medications  Medication Sig Dispense Refill  . Calcium Carb-Cholecalciferol 500-400 MG-UNIT CHEW One tablet twice daily 60 tablet   . digoxin (DIGOX) 0.125 MG tablet Take 1 tablet (125 mcg total) by mouth daily. 90 tablet 3  . fluticasone (FLONASE) 50 MCG/ACT nasal spray USE 1 SPRAY IN EACH NOSTRIL DAILY. 48 g 1  . hydrochlorothiazide (HYDRODIURIL) 25 MG tablet Take 1 tablet (25 mg total) by mouth daily. 90 tablet 3  . levothyroxine (SYNTHROID, LEVOTHROID) 112 MCG tablet TAKE 1 TABLET BY MOUTH ONCE DAILY. 30 tablet 3  . lovastatin (MEVACOR) 40 MG tablet Take 1 tablet (40 mg total) by mouth daily. 90 tablet 3  . metoprolol tartrate (LOPRESSOR) 100 MG tablet Take 1 tablet (100 mg total) by mouth 2 (two) times daily. 180 tablet 3    . omeprazole (PRILOSEC) 40 MG capsule TAKE (1) CAPSULE BY MOUTH ONCE DAILY FOR ACID REFLUX. 30 capsule 3  . OXYGEN-HELIUM IN Inhale 2 L into the lungs at bedtime.    . Potassium 99 MG TABS Take 1 tablet by mouth daily.    . predniSONE (DELTASONE) 5 MG tablet Take one tablet by mouth two time daily for 5 days 10 tablet 0  . warfarin (COUMADIN) 5 MG tablet TAKE 1/2 TABLET BY MOUTH DAILY EXCEPT 1 TABLET ON WEDNESDAYS OR AS DIRECTED. 45 tablet 3   No current facility-administered medications for this visit.      Past Surgical History:  Procedure Laterality Date  . ABDOMINAL HYSTERECTOMY    . CATARACT EXTRACTION     Bilateral  . COLONOSCOPY  2009   Dr. Gala Romney  . EYE SURGERY     laser   . PACEMAKER INSERTION  2011   dual chamber   . PPM GENERATOR CHANGEOUT N/A 11/13/2016   Procedure: PPM Generator Changeout;  Surgeon: Evans Lance, MD;  Location: [facility];  Service: Cardiovascular;  Laterality: N/A;  . SALPINGOOPHORECTOMY  1970   right     No Known Allergies    Family History  Problem Relation Age of Onset  . Cancer Mother        gential  .  Cancer Father        throat   . Pneumonia Sister   . Emphysema Sister   . Mitral valve prolapse Sister   . Heart disease Unknown   . Arthritis Unknown   . Lung disease Unknown   . Asthma Unknown   . Melanoma Son      Social History Ms. Duddy reports that she has never smoked. She has never used smokeless tobacco. Ms. Klinck reports that she does not drink alcohol.   Review of Systems CONSTITUTIONAL: No weight loss, fever, chills, weakness or fatigue.  HEENT: Eyes: No visual loss, blurred vision, double vision or yellow sclerae.No hearing loss, sneezing, congestion, runny nose or sore throat.  SKIN: No rash or itching.  CARDIOVASCULAR: per hpi RESPIRATORY: No shortness of breath, cough or sputum.  GASTROINTESTINAL: No anorexia, nausea, vomiting or diarrhea. No abdominal pain or blood.  GENITOURINARY: No burning on  urination, no polyuria NEUROLOGICAL: No headache, dizziness, syncope, paralysis, ataxia, numbness or tingling in the extremities. No change in bowel or bladder control.  MUSCULOSKELETAL: No muscle, back pain, joint pain or stiffness.  LYMPHATICS: No enlarged nodes. No history of splenectomy.  PSYCHIATRIC: No history of depression or anxiety.  ENDOCRINOLOGIC: No reports of sweating, cold or heat intolerance. No polyuria or polydipsia.  Marland Kitchen   Physical Examination Vitals:   04/22/18 1307  BP: 118/64  Pulse: 92  SpO2: 98%   Vitals:   04/22/18 1307  Weight: 153 lb (69.4 kg)  Height: 5\' 6"  (1.676 m)    Gen: resting comfortably, no acute distress HEENT: no scleral icterus, pupils equal round and reactive, no palptable cervical adenopathy,  CV: RRR, no m/r/g no jvd Resp: Clear to auscultation bilaterally GI: abdomen is soft, non-tender, non-distended, normal bowel sounds, no hepatosplenomegaly MSK: extremities are warm, no edema.  Skin: warm, no rash Neuro:  no focal deficits Psych: appropriate affect   Diagnostic Studies     Assessment and Plan   1. Afib - CHADS2Vasc score is 4, continue anticoag - doing well, continue current meds  2. HTN -at goal, continue current meds  3. HL - LDL at goal, continue statin  4. Symptomatic bradycardia - continue to follow in device clinic,normal function by last check.    F/u 6 months   Arnoldo Lenis, M.D.

## 2018-04-22 NOTE — Patient Instructions (Signed)
Continue coumadin 1/2 tablet daily except 1 tablet only on Wednesdays and Saturdays.  Recheck in 3 weeks Keep intake of greens consistent

## 2018-04-22 NOTE — Patient Instructions (Signed)

## 2018-05-03 DIAGNOSIS — R0902 Hypoxemia: Secondary | ICD-10-CM | POA: Diagnosis not present

## 2018-05-06 LAB — CUP PACEART REMOTE DEVICE CHECK
Date Time Interrogation Session: 20190814172658
Implantable Lead Implant Date: 20110422
Implantable Lead Location: 753860
Implantable Pulse Generator Implant Date: 20180319
Lead Channel Impedance Value: 430 Ohm
Lead Channel Pacing Threshold Amplitude: 1 V
Lead Channel Pacing Threshold Pulse Width: 0.5 ms
Lead Channel Sensing Intrinsic Amplitude: 5.4 mV
Lead Channel Setting Sensing Sensitivity: 1.5 mV
MDC IDC LEAD IMPLANT DT: 20110422
MDC IDC LEAD LOCATION: 753859
MDC IDC MSMT BATTERY REMAINING LONGEVITY: 117 mo
MDC IDC MSMT BATTERY REMAINING PERCENTAGE: 95.5 %
MDC IDC MSMT BATTERY VOLTAGE: 3.02 V
MDC IDC SET LEADCHNL RV PACING AMPLITUDE: 2 V
MDC IDC SET LEADCHNL RV PACING PULSEWIDTH: 0.5 ms
MDC IDC STAT BRADY RV PERCENT PACED: 88 %
Pulse Gen Serial Number: 8010902

## 2018-05-08 ENCOUNTER — Encounter (INDEPENDENT_AMBULATORY_CARE_PROVIDER_SITE_OTHER): Payer: Medicare Other | Admitting: Ophthalmology

## 2018-05-08 DIAGNOSIS — H43813 Vitreous degeneration, bilateral: Secondary | ICD-10-CM

## 2018-05-08 DIAGNOSIS — H35033 Hypertensive retinopathy, bilateral: Secondary | ICD-10-CM

## 2018-05-08 DIAGNOSIS — H353231 Exudative age-related macular degeneration, bilateral, with active choroidal neovascularization: Secondary | ICD-10-CM | POA: Diagnosis not present

## 2018-05-08 DIAGNOSIS — I10 Essential (primary) hypertension: Secondary | ICD-10-CM | POA: Diagnosis not present

## 2018-05-22 ENCOUNTER — Ambulatory Visit (INDEPENDENT_AMBULATORY_CARE_PROVIDER_SITE_OTHER): Payer: Medicare Other | Admitting: *Deleted

## 2018-05-22 DIAGNOSIS — Z5181 Encounter for therapeutic drug level monitoring: Secondary | ICD-10-CM

## 2018-05-22 DIAGNOSIS — I480 Paroxysmal atrial fibrillation: Secondary | ICD-10-CM

## 2018-05-22 LAB — POCT INR: INR: 2.3 (ref 2.0–3.0)

## 2018-05-22 NOTE — Patient Instructions (Signed)
Continue coumadin 1/2 tablet daily except 1 tablet only on Wednesdays and Saturdays.  Recheck in 4 weeks Keep intake of greens consistent

## 2018-06-02 DIAGNOSIS — R0902 Hypoxemia: Secondary | ICD-10-CM | POA: Diagnosis not present

## 2018-06-04 ENCOUNTER — Other Ambulatory Visit: Payer: Self-pay | Admitting: Family Medicine

## 2018-06-10 ENCOUNTER — Ambulatory Visit: Payer: Medicare Other

## 2018-06-17 ENCOUNTER — Ambulatory Visit (INDEPENDENT_AMBULATORY_CARE_PROVIDER_SITE_OTHER): Payer: Medicare Other

## 2018-06-17 VITALS — BP 122/65 | HR 72 | Resp 12 | Ht 66.0 in | Wt 151.0 lb

## 2018-06-17 DIAGNOSIS — Z23 Encounter for immunization: Secondary | ICD-10-CM | POA: Diagnosis not present

## 2018-06-17 DIAGNOSIS — Z0000 Encounter for general adult medical examination without abnormal findings: Secondary | ICD-10-CM

## 2018-06-17 NOTE — Patient Instructions (Signed)
[name] , Thank you for taking time to come for your Medicare Wellness Visit. I appreciate your ongoing commitment to your health goals. Please review the following plan we discussed and let me know if I can assist you in the future.   Screening recommendations/referrals: Colonoscopy: Postponed  Mammogram: N/A  Bone Density: up to date  Recommended yearly ophthalmology/optometry visit for glaucoma screening and checkup Recommended yearly dental visit for hygiene and checkup  Vaccinations: Influenza vaccine: Given today  Pneumococcal vaccine: up to date  Tdap vaccine: up to date  Shingles vaccine: will check with insurance     Advanced directives: in place   Conditions/risks identified: advanced age, impaired mobility, hard of hearing, impaired vision   Next appointment: wellness appointment in one year    Preventive Care 82 Years and Older, Female Preventive care refers to lifestyle choices and visits with your health care provider that can promote health and wellness. What does preventive care include?  A yearly physical exam. This is also called an annual well check.  Dental exams once or twice a year.  Routine eye exams. Ask your health care provider how often you should have your eyes checked.  Personal lifestyle choices, including:  Daily care of your teeth and gums.  Regular physical activity.  Eating a healthy diet.  Avoiding tobacco and drug use.  Limiting alcohol use.  Practicing safe sex.  Taking low-dose aspirin every day.  Taking vitamin and mineral supplements as recommended by your health care provider. What happens during an annual well check? The services and screenings done by your health care provider during your annual well check will depend on your age, overall health, lifestyle risk factors, and family history of disease. Counseling  Your health care provider may ask you questions about your:  Alcohol use.  Tobacco use.  Drug  use.  Emotional well-being.  Home and relationship well-being.  Sexual activity.  Eating habits.  History of falls.  Memory and ability to understand (cognition).  Work and work Statistician.  Reproductive health. Screening  You may have the following tests or measurements:  Height, weight, and BMI.  Blood pressure.  Lipid and cholesterol levels. These may be checked every 5 years, or more frequently if you are over 36 years old.  Skin check.  Lung cancer screening. You may have this screening every year starting at age 82 if you have a 30-pack-year history of smoking and currently smoke or have quit within the past 15 years.  Fecal occult blood test (FOBT) of the stool. You may have this test every year starting at age 82.  Flexible sigmoidoscopy or colonoscopy. You may have a sigmoidoscopy every 5 years or a colonoscopy every 10 years starting at age 82.  Hepatitis C blood test.  Hepatitis B blood test.  Sexually transmitted disease (STD) testing.  Diabetes screening. This is done by checking your blood sugar (glucose) after you have not eaten for a while (fasting). You may have this done every 1-3 years.  Bone density scan. This is done to screen for osteoporosis. You may have this done starting at age 82.  Mammogram. This may be done every 1-2 years. Talk to your health care provider about how often you should have regular mammograms. Talk with your health care provider about your test results, treatment options, and if necessary, the need for more tests. Vaccines  Your health care provider may recommend certain vaccines, such as:  Influenza vaccine. This is recommended every year.  Tetanus,  diphtheria, and acellular pertussis (Tdap, Td) vaccine. You may need a Td booster every 10 years.  Zoster vaccine. You may need this after age 82.  Pneumococcal 13-valent conjugate (PCV13) vaccine. One dose is recommended after age 82.  Pneumococcal polysaccharide  (PPSV23) vaccine. One dose is recommended after age 82. Talk to your health care provider about which screenings and vaccines you need and how often you need them. This information is not intended to replace advice given to you by your health care provider. Make sure you discuss any questions you have with your health care provider. Document Released: 09/10/2015 Document Revised: 05/03/2016 Document Reviewed: 06/15/2015 Elsevier Interactive Patient Education  2017 [place] Prevention in the Home Falls can cause injuries. They can happen to people of all ages. There are many things you can do to make your home safe and to help prevent falls. What can I do on the outside of my home?  Regularly fix the edges of walkways and driveways and fix any cracks.  Remove anything that might make you trip as you walk through a door, such as a raised step or threshold.  Trim any bushes or trees on the path to your home.  Use bright outdoor lighting.  Clear any walking paths of anything that might make someone trip, such as rocks or tools.  Regularly check to see if handrails are loose or broken. Make sure that both sides of any steps have handrails.  Any raised decks and porches should have guardrails on the edges.  Have any leaves, snow, or ice cleared regularly.  Use sand or salt on walking paths during winter.  Clean up any spills in your garage right away. This includes oil or grease spills. What can I do in the bathroom?  Use night lights.  Install grab bars by the toilet and in the tub and shower. Do not use towel bars as grab bars.  Use non-skid mats or decals in the tub or shower.  If you need to sit down in the shower, use a plastic, non-slip stool.  Keep the floor dry. Clean up any water that spills on the floor as soon as it happens.  Remove soap buildup in the tub or shower regularly.  Attach bath mats securely with double-sided non-slip rug tape.  Do not have  throw rugs and other things on the floor that can make you trip. What can I do in the bedroom?  Use night lights.  Make sure that you have a light by your bed that is easy to reach.  Do not use any sheets or blankets that are too big for your bed. They should not hang down onto the floor.  Have a firm chair that has side arms. You can use this for support while you get dressed.  Do not have throw rugs and other things on the floor that can make you trip. What can I do in the kitchen?  Clean up any spills right away.  Avoid walking on wet floors.  Keep items that you use a lot in easy-to-reach places.  If you need to reach something above you, use a strong step stool that has a grab bar.  Keep electrical cords out of the way.  Do not use floor polish or wax that makes floors slippery. If you must use wax, use non-skid floor wax.  Do not have throw rugs and other things on the floor that can make you trip. What can I do with  my stairs?  Do not leave any items on the stairs.  Make sure that there are handrails on both sides of the stairs and use them. Fix handrails that are broken or loose. Make sure that handrails are as long as the stairways.  Check any carpeting to make sure that it is firmly attached to the stairs. Fix any carpet that is loose or worn.  Avoid having throw rugs at the top or bottom of the stairs. If you do have throw rugs, attach them to the floor with carpet tape.  Make sure that you have a light switch at the top of the stairs and the bottom of the stairs. If you do not have them, ask someone to add them for you. What else can I do to help prevent falls?  Wear shoes that:  Do not have high heels.  Have rubber bottoms.  Are comfortable and fit you well.  Are closed at the toe. Do not wear sandals.  If you use a stepladder:  Make sure that it is fully opened. Do not climb a closed stepladder.  Make sure that both sides of the stepladder are  locked into place.  Ask someone to hold it for you, if possible.  Clearly mark and make sure that you can see:  Any grab bars or handrails.  First and last steps.  Where the edge of each step is.  Use tools that help you move around (mobility aids) if they are needed. These include:  Canes.  Walkers.  Scooters.  Crutches.  Turn on the lights when you go into a dark area. Replace any light bulbs as soon as they burn out.  Set up your furniture so you have a clear path. Avoid moving your furniture around.  If any of your floors are uneven, fix them.  If there are any pets around you, be aware of where they are.  Review your medicines with your doctor. Some medicines can make you feel dizzy. This can increase your chance of falling. Ask your doctor what other things that you can do to help prevent falls. This information is not intended to replace advice given to you by your health care provider. Make sure you discuss any questions you have with your health care provider. Document Released: 06/10/2009 Document Revised: 01/20/2016 Document Reviewed: 09/18/2014 Elsevier Interactive Patient Education  2017 [employer].

## 2018-06-17 NOTE — Progress Notes (Signed)
Subjective:   [name] is a 84 y.o. female who presents for Medicare Annual (Subsequent) preventive examination.  Review of Systems:  Cardiac Risk Factors include: hypertension;dyslipidemia;advanced age (>39men, >75 women)     Objective:     Vitals: BP 122/65   Pulse 72   Resp 12   Ht 5\' 6"  (1.676 m)   Wt 151 lb (68.5 kg)   SpO2 98%   BMI 24.37 kg/m   Body mass index is 24.37 kg/m.  Advanced Directives 11/13/2016 10/18/2016 02/09/2015  Does Patient Have a Medical Advance Directive? No No Yes  Type of Advance Directive - - Living will  Would patient like information on creating a medical advance directive? No - Patient declined (No Data) -    Tobacco Social History   Tobacco Use  Smoking Status Never Smoker  Smokeless Tobacco Never Used     Counseling given: Not Answered   Clinical Intake:  Pre-visit preparation completed: Yes  Pain : No/denies pain Pain Score: 0-No pain     BMI - recorded: 24.4 Nutritional Status: BMI of 19-24  Normal Nutritional Risks: None Diabetes: No  How often do you need to have someone help you when you read instructions, pamphlets, or other written materials from your doctor or pharmacy?: 3 - Sometimes What is the last grade level you completed in school?: 12 grade   Interpreter Needed?: No  Information entered by :: Francena Hanly LPN  Past Medical History:  Diagnosis Date  . Arthritis   . Gastroesophageal reflux disease   . Hearing impairment   . Hyperlipidemia   . Hypertension   . Hypothyroidism   . Impaired glucose tolerance   . Macular degeneration   . Pancreatitis   . Paroxysmal atrial fibrillation ([place]) 2011  . Sick sinus syndrome ([place]) 2011   Atrial fibrilation 10/2009; and dual chamber pacemaker in 4/11; normal EF  . Sleep apnea    Nocturnal oxygen therapy  . Zoster 2016   Past Surgical History:  Procedure Laterality Date  . ABDOMINAL HYSTERECTOMY    . CATARACT EXTRACTION     Bilateral  . COLONOSCOPY   2009   Dr. Gala Romney  . EYE SURGERY     laser   . PACEMAKER INSERTION  2011   dual chamber   . PPM GENERATOR CHANGEOUT N/A 11/13/2016   Procedure: PPM Generator Changeout;  Surgeon: Evans Lance, MD;  Location: [facility];  Service: Cardiovascular;  Laterality: N/A;  . SALPINGOOPHORECTOMY  1970   right   Family History  Problem Relation Age of Onset  . Cancer Mother        gential  . Cancer Father        throat   . Pneumonia Sister   . Emphysema Sister   . Mitral valve prolapse Sister   . Heart disease Unknown   . Arthritis Unknown   . Lung disease Unknown   . Asthma Unknown   . Melanoma Son    Social History   Socioeconomic History  . Marital status: Widowed    Spouse name: Not on file  . Number of children: 7  . Years of education: college  . Highest education level: Not on file  Occupational History  . Occupation: retired     Fish farm manager: RETIRED  Social Needs  . Financial resource strain: Not very hard  . Food insecurity:    Worry: Never true    Inability: Never true  . Transportation needs:    Medical:  No    Non-medical: No  Tobacco Use  . Smoking status: Never Smoker  . Smokeless tobacco: Never Used  Substance and Sexual Activity  . Alcohol use: No    Alcohol/week: 0.0 standard drinks  . Drug use: No  . Sexual activity: Not Currently  Lifestyle  . Physical activity:    Days per week: 2 days    Minutes per session: 30 min  . Stress: Not at all  Relationships  . Social connections:    Talks on phone: More than three times a week    Gets together: Once a week    Attends religious service: Never    Active member of club or organization: No    Attends meetings of clubs or organizations: Never    Relationship status: Widowed  Other Topics Concern  . Not on file  Social History Narrative  . Not on file    Outpatient Encounter Medications as of 06/17/2018  Medication Sig  . Calcium Carb-Cholecalciferol 500-400 MG-UNIT CHEW One tablet twice  daily  . digoxin (DIGOX) 0.125 MG tablet Take 1 tablet (125 mcg total) by mouth daily.  . fluticasone (FLONASE) 50 MCG/ACT nasal spray USE 1 SPRAY IN EACH NOSTRIL DAILY.  . hydrochlorothiazide (HYDRODIURIL) 25 MG tablet Take 1 tablet (25 mg total) by mouth daily.  Marland Kitchen levothyroxine (SYNTHROID, LEVOTHROID) 112 MCG tablet TAKE 1 TABLET BY MOUTH ONCE DAILY.  Marland Kitchen lovastatin (MEVACOR) 40 MG tablet Take 1 tablet (40 mg total) by mouth daily.  . metoprolol tartrate (LOPRESSOR) 100 MG tablet Take 1 tablet (100 mg total) by mouth 2 (two) times daily.  Marland Kitchen omeprazole (PRILOSEC) 40 MG capsule TAKE (1) CAPSULE BY MOUTH ONCE DAILY FOR ACID REFLUX.  Marland Kitchen OXYGEN-HELIUM IN Inhale 2 L into the lungs at bedtime.  . Potassium 99 MG TABS Take 1 tablet by mouth daily.  . predniSONE (DELTASONE) 5 MG tablet Take one tablet by mouth two time daily for 5 days  . warfarin (COUMADIN) 5 MG tablet TAKE 1/2 TABLET BY MOUTH DAILY EXCEPT 1 TABLET ON WEDNESDAYS OR AS DIRECTED.  . [DISCONTINUED] benzonatate (TESSALON PERLES) 100 MG capsule Take 1 capsule (100 mg total) by mouth every 6 (six) hours as needed for cough.   No facility-administered encounter medications on file as of 06/17/2018.     Activities of Daily Living In your present state of health, do you have any difficulty performing the following activities: 06/17/2018  Hearing? Y  Vision? Y  Difficulty concentrating or making decisions? Y  Walking or climbing stairs? Y  Dressing or bathing? N  Doing errands, shopping? Y  Preparing Food and eating ? N  Using the Toilet? N  In the past six months, have you accidently leaked urine? Y  Do you have problems with loss of bowel control? N  Managing your Medications? N  Managing your Finances? N  Housekeeping or managing your Housekeeping? N  Some recent data might be hidden    Patient Care Team: Fayrene Helper, MD as PCP - General (Family Medicine) Harl Bowie, Alphonse Guild, MD as PCP - Cardiology (Cardiology) Evans Lance, MD (Cardiology) Gala Romney Cristopher Estimable, MD as Attending Physician (Gastroenterology) Lattie Haw Cristopher Estimable, MD as Attending Physician (Cardiology) Hayden Pedro, MD as Attending Physician (Ophthalmology) Sinda Du, MD as Attending Physician (Pulmonary Disease)    Assessment:   This is a routine wellness examination for [place].  Exercise Activities and Dietary recommendations Current Exercise Habits: Home exercise routine, Type of exercise: walking, Time (Minutes): 20,  Frequency (Times/Week): 4, Weekly Exercise (Minutes/Week): 80, Intensity: Mild, Exercise limited by: orthopedic condition(s)  Goals    . DIET - EAT MORE FRUITS AND VEGETABLES    . Increase physical activity     Patient would like to be able to increase her physical activity so she can be more active with her great grandchildren.    Marland Kitchen LIFESTYLE - DECREASE FALLS RISK       Fall Risk Fall Risk  06/17/2018 03/26/2018 10/02/2017 10/18/2016 04/19/2016  Falls in the past year? No No Yes Yes Yes  Number falls in past yr: - - 2 or more 2 or more 1  Injury with Fall? - - No No No  Risk Factor Category  - - High Fall Risk - -  Risk for fall due to : Impaired balance/gait;Impaired vision;Impaired mobility;Medication side effect - - History of fall(s);Impaired balance/gait;Impaired vision Impaired balance/gait  Follow up - - - Falls prevention discussed;Falls evaluation completed Falls evaluation completed  Comment - - - Recommend safe strides program with [facility]. Positive TUG 22 seconds -   Is the patient's home free of loose throw rugs in walkways, pet beds, electrical cords, etc?   no      Grab bars in the bathroom? yes      Handrails on the stairs?   yes      Adequate lighting?   yes  Timed Get Up and Go performed: patient able to perform in 11 seconds with the aid of a walker   Depression Screen PHQ 2/9 Scores 06/17/2018 03/26/2018 10/18/2016 04/19/2016  PHQ - 2 Score 0 3 0 0  PHQ- 9 Score - 6 - -      Cognitive Function     6CIT Screen 06/17/2018 10/18/2016  What Year? 0 points 0 points  What month? 0 points 0 points  What time? 0 points 0 points  Count back from 20 2 points 0 points  Months in reverse 0 points 0 points  Repeat phrase 0 points 0 points  Total Score 2 0    Immunization History  Administered Date(s) Administered  . H1N1 07/01/2008  . Influenza Split 06/14/2012  . Influenza Whole 06/22/2006, 05/27/2008, 06/10/2009, 06/09/2010, 05/10/2011  . Influenza,inj,Quad PF,6+ Mos 06/09/2013, 08/12/2014, 07/15/2015, 04/19/2016, 07/04/2017  . Pneumococcal Conjugate-13 03/18/2014  . Pneumococcal Polysaccharide-23 06/09/2008  . Tdap 04/22/2015    Qualifies for Shingles Vaccine? Postponed   Screening Tests Health Maintenance  Topic Date Due  . INFLUENZA VACCINE  03/28/2018  . TETANUS/TDAP  04/21/2025  . DEXA SCAN  Completed  . PNA vac Low Risk Adult  Completed    Cancer Screenings: Lung: Low Dose CT Chest recommended if Age 74-80 years, 30 pack-year currently smoking OR have quit w/in 15years. Patient does not qualify. Breast:  Up to date on Mammogram? No   Up to date of Bone Density/Dexa? Yes Colorectal: ten years ago   Additional Screenings:  Hepatitis C Screening: N/A      Plan:   Plan to continue to increase physical activity as tolerated, decrease fall risk, eat better.   I have personally reviewed and noted the following in the patient's chart:   . Medical and social history . Use of alcohol, tobacco or illicit drugs  . Current medications and supplements . Functional ability and status . Nutritional status . Physical activity . Advanced directives . List of other physicians . Hospitalizations, surgeries, and ER visits in previous 12 months . Vitals . Screenings to include cognitive, depression, and  falls . Referrals and appointments  In addition, I have reviewed and discussed with patient certain preventive protocols, quality metrics, and best  practice recommendations. A written personalized care plan for preventive services as well as general preventive health recommendations were provided to patient.     Francoise Schaumann, LPN  [id number]

## 2018-06-19 ENCOUNTER — Ambulatory Visit (INDEPENDENT_AMBULATORY_CARE_PROVIDER_SITE_OTHER): Payer: Medicare Other | Admitting: *Deleted

## 2018-06-19 ENCOUNTER — Other Ambulatory Visit: Payer: Self-pay | Admitting: Cardiology

## 2018-06-19 DIAGNOSIS — Z5181 Encounter for therapeutic drug level monitoring: Secondary | ICD-10-CM

## 2018-06-19 DIAGNOSIS — I480 Paroxysmal atrial fibrillation: Secondary | ICD-10-CM | POA: Diagnosis not present

## 2018-06-19 LAB — POCT INR: INR: 2.5 (ref 2.0–3.0)

## 2018-06-19 NOTE — Patient Instructions (Signed)
Continue coumadin 1/2 tablet daily except 1 tablet only on Wednesdays and Saturdays.  Recheck in 6 weeks Keep intake of greens consistent

## 2018-07-03 DIAGNOSIS — R0902 Hypoxemia: Secondary | ICD-10-CM | POA: Diagnosis not present

## 2018-07-10 ENCOUNTER — Ambulatory Visit (INDEPENDENT_AMBULATORY_CARE_PROVIDER_SITE_OTHER): Payer: Medicare Other | Admitting: *Deleted

## 2018-07-10 ENCOUNTER — Telehealth: Payer: Self-pay | Admitting: Cardiology

## 2018-07-10 DIAGNOSIS — I4891 Unspecified atrial fibrillation: Secondary | ICD-10-CM

## 2018-07-10 DIAGNOSIS — I495 Sick sinus syndrome: Secondary | ICD-10-CM

## 2018-07-10 NOTE — Telephone Encounter (Signed)
LMOVM reminding pt to send remote transmission.   

## 2018-07-10 NOTE — Progress Notes (Signed)
Remote pacemaker transmission.   

## 2018-07-31 ENCOUNTER — Ambulatory Visit (INDEPENDENT_AMBULATORY_CARE_PROVIDER_SITE_OTHER): Payer: Medicare Other | Admitting: *Deleted

## 2018-07-31 DIAGNOSIS — I480 Paroxysmal atrial fibrillation: Secondary | ICD-10-CM

## 2018-07-31 DIAGNOSIS — Z5181 Encounter for therapeutic drug level monitoring: Secondary | ICD-10-CM

## 2018-07-31 LAB — POCT INR: INR: 2 (ref 2.0–3.0)

## 2018-07-31 NOTE — Patient Instructions (Signed)
Continue coumadin 1/2 tablet daily except 1 tablet only on Wednesdays and Saturdays.  Recheck in 6 weeks Keep intake of greens consistent

## 2018-08-02 ENCOUNTER — Other Ambulatory Visit: Payer: Self-pay | Admitting: Cardiology

## 2018-08-02 DIAGNOSIS — R0902 Hypoxemia: Secondary | ICD-10-CM | POA: Diagnosis not present

## 2018-08-08 ENCOUNTER — Encounter: Payer: Self-pay | Admitting: Family Medicine

## 2018-08-08 ENCOUNTER — Ambulatory Visit (INDEPENDENT_AMBULATORY_CARE_PROVIDER_SITE_OTHER): Payer: Medicare Other | Admitting: Family Medicine

## 2018-08-08 VITALS — BP 136/80 | HR 84 | Resp 15 | Ht 66.0 in | Wt 151.0 lb

## 2018-08-08 DIAGNOSIS — I10 Essential (primary) hypertension: Secondary | ICD-10-CM

## 2018-08-08 DIAGNOSIS — Z0000 Encounter for general adult medical examination without abnormal findings: Secondary | ICD-10-CM

## 2018-08-08 DIAGNOSIS — E785 Hyperlipidemia, unspecified: Secondary | ICD-10-CM

## 2018-08-08 NOTE — Patient Instructions (Addendum)
F/U end June, call if you need me before  Exam today is very good  BEST WISHES and all the best for 2020  Fasting CBc, lipid, cmp and EGFr , TSH in January   Please continue to use your walker to be safe  Thank you  for choosing [facility]. We consider it a privelige to serve you.  Delivering excellent health care in a caring and  compassionate way is our goal.  Partnering with you,  so that together we can achieve this goal is our strategy.

## 2018-08-11 ENCOUNTER — Encounter: Payer: Self-pay | Admitting: Family Medicine

## 2018-08-11 NOTE — Assessment & Plan Note (Signed)
Annual exam as documented. °Counseling done  re healthy lifestyle involving commitment to 150 minutes exercise per week, heart healthy diet, and attaining healthy weight.The importance of adequate sleep also discussed. °Regular seat belt use and home safety, is also discussed. ° °

## 2018-08-11 NOTE — Progress Notes (Signed)
    [name]     MRN: [id number]      DOB: [birth year]  HPI: Patient is in for annual physical exam. No other health concerns are expressed or addressed at the visit. Recent labs, if available are reviewed. Immunization is reviewed , and  updated if needed.   PE: BP 136/80   Pulse 84   Resp 15   Ht 5\' 6"  (1.676 m)   Wt 151 lb (68.5 kg)   SpO2 98%   BMI 24.37 kg/m   Pleasant  female, alert and oriented x 3, in no cardio-pulmonary distress. Afebrile. HEENT No facial trauma or asymetry. Sinuses non tender.  Extra occullar muscles intact, pupils equally reactive to light. External ears normal, tympanic membranes clear. Oropharynx moist, no exudate. Neck: decreased ROM no adenopathy,JVD or thyromegaly.No bruits.  Chest: Clear to ascultation bilaterally.No crackles or wheezes.Decreased air entry Non tender to palpation  Breast: No asymetry,no masses or lumps. No tenderness. No nipple discharge or inversion. No axillary or supraclavicular adenopathy  Cardiovascular system; Heart sounds normal,  S1 and  S2 ,no S3.  No murmur, or thrill. Apical beat not displaced Peripheral pulses normal.  Abdomen: Soft, non tender, no organomegaly or masses. No bruits. Bowel sounds normal. No guarding, tenderness or rebound.   Musculoskeletal exam: Decreased ROM of spine, hips , shoulders and knees. Deformity ,swelling and  crepitus noted. muscle wasting noted Neurologic: Cranial nerves marked hearing loss otherwise normal. Abnormal gait. No tremor.Relies in a walker for safe mobility. Decreased power and tone in all extremities,due to muscle wasting,sensation is intact  Skin: Intact, no ulceration, erythema , scaling or rash noted. Pigmentation normal throughout  Psych; Normal mood and affect. Judgement and concentration normal   Assessment & Plan:  Annual physical exam Annual exam as documented. Counseling done  re healthy lifestyle involving commitment to 150 minutes  exercise per week, heart healthy diet, and attaining healthy weight.The importance of adequate sleep also discussed. Regular seat belt use and home safety, is also discussed.

## 2018-08-27 ENCOUNTER — Encounter: Payer: Self-pay | Admitting: *Deleted

## 2018-08-30 ENCOUNTER — Encounter: Payer: Self-pay | Admitting: *Deleted

## 2018-09-02 DIAGNOSIS — R0902 Hypoxemia: Secondary | ICD-10-CM | POA: Diagnosis not present

## 2018-09-03 ENCOUNTER — Other Ambulatory Visit: Payer: Self-pay | Admitting: Cardiology

## 2018-09-09 ENCOUNTER — Other Ambulatory Visit: Payer: Self-pay | Admitting: Cardiology

## 2018-09-10 LAB — CUP PACEART REMOTE DEVICE CHECK
Battery Remaining Percentage: 95.5 %
Battery Voltage: 3.01 V
Date Time Interrogation Session: 20191113171708
Implantable Lead Implant Date: 20110422
Implantable Lead Location: 753859
Implantable Lead Location: 753860
Lead Channel Impedance Value: 440 Ohm
Lead Channel Pacing Threshold Amplitude: 1 V
Lead Channel Pacing Threshold Pulse Width: 0.5 ms
Lead Channel Sensing Intrinsic Amplitude: 5.8 mV
Lead Channel Setting Pacing Amplitude: 2 V
Lead Channel Setting Pacing Pulse Width: 0.5 ms
Lead Channel Setting Sensing Sensitivity: 1.5 mV
MDC IDC LEAD IMPLANT DT: 20110422
MDC IDC MSMT BATTERY REMAINING LONGEVITY: 117 mo
MDC IDC PG IMPLANT DT: 20180319
MDC IDC STAT BRADY RV PERCENT PACED: 89 %
Pulse Gen Model: 2272
Pulse Gen Serial Number: 8010902

## 2018-09-25 ENCOUNTER — Other Ambulatory Visit: Payer: Self-pay | Admitting: Cardiology

## 2018-09-25 ENCOUNTER — Other Ambulatory Visit: Payer: Self-pay | Admitting: Family Medicine

## 2018-09-25 ENCOUNTER — Ambulatory Visit (INDEPENDENT_AMBULATORY_CARE_PROVIDER_SITE_OTHER): Payer: Medicare Other | Admitting: Pharmacist

## 2018-09-25 DIAGNOSIS — I480 Paroxysmal atrial fibrillation: Secondary | ICD-10-CM | POA: Diagnosis not present

## 2018-09-25 LAB — POCT INR: INR: 2.8 (ref 2.0–3.0)

## 2018-09-25 NOTE — Patient Instructions (Signed)
Description   Continue coumadin 1/2 tablet daily except 1 tablet only on Wednesdays and Saturdays.  Recheck in 6 weeks Keep intake of greens consistent

## 2018-10-02 ENCOUNTER — Encounter (INDEPENDENT_AMBULATORY_CARE_PROVIDER_SITE_OTHER): Payer: Medicare Other | Admitting: Ophthalmology

## 2018-10-03 DIAGNOSIS — R0902 Hypoxemia: Secondary | ICD-10-CM | POA: Diagnosis not present

## 2018-10-09 ENCOUNTER — Ambulatory Visit (INDEPENDENT_AMBULATORY_CARE_PROVIDER_SITE_OTHER): Payer: Medicare Other

## 2018-10-09 DIAGNOSIS — I495 Sick sinus syndrome: Secondary | ICD-10-CM

## 2018-10-11 LAB — CUP PACEART REMOTE DEVICE CHECK
Battery Remaining Longevity: 116 mo
Battery Remaining Percentage: 95.5 %
Brady Statistic RV Percent Paced: 90 %
Date Time Interrogation Session: 20200212163152
Implantable Lead Implant Date: 20110422
Implantable Lead Implant Date: 20110422
Implantable Lead Location: 753859
Implantable Lead Location: 753860
Implantable Pulse Generator Implant Date: 20180319
Lead Channel Impedance Value: 430 Ohm
Lead Channel Pacing Threshold Amplitude: 1 V
Lead Channel Pacing Threshold Pulse Width: 0.5 ms
Lead Channel Sensing Intrinsic Amplitude: 4.2 mV
Lead Channel Setting Pacing Amplitude: 2 V
Lead Channel Setting Pacing Pulse Width: 0.5 ms
Lead Channel Setting Sensing Sensitivity: 1.5 mV
MDC IDC MSMT BATTERY VOLTAGE: 3.01 V
Pulse Gen Model: 2272
Pulse Gen Serial Number: 8010902

## 2018-10-21 NOTE — Progress Notes (Signed)
Remote pacemaker transmission.   

## 2018-10-25 ENCOUNTER — Other Ambulatory Visit: Payer: Self-pay | Admitting: Cardiology

## 2018-11-01 DIAGNOSIS — R0902 Hypoxemia: Secondary | ICD-10-CM | POA: Diagnosis not present

## 2018-11-04 ENCOUNTER — Ambulatory Visit: Payer: Medicare Other | Admitting: Cardiology

## 2018-11-04 ENCOUNTER — Encounter: Payer: Self-pay | Admitting: Cardiology

## 2018-11-04 ENCOUNTER — Ambulatory Visit (INDEPENDENT_AMBULATORY_CARE_PROVIDER_SITE_OTHER): Payer: Medicare Other | Admitting: *Deleted

## 2018-11-04 VITALS — BP 124/80 | HR 78 | Ht 66.0 in | Wt 148.0 lb

## 2018-11-04 DIAGNOSIS — R001 Bradycardia, unspecified: Secondary | ICD-10-CM

## 2018-11-04 DIAGNOSIS — Z5181 Encounter for therapeutic drug level monitoring: Secondary | ICD-10-CM

## 2018-11-04 DIAGNOSIS — I4891 Unspecified atrial fibrillation: Secondary | ICD-10-CM

## 2018-11-04 DIAGNOSIS — I480 Paroxysmal atrial fibrillation: Secondary | ICD-10-CM | POA: Diagnosis not present

## 2018-11-04 DIAGNOSIS — E782 Mixed hyperlipidemia: Secondary | ICD-10-CM | POA: Diagnosis not present

## 2018-11-04 DIAGNOSIS — I10 Essential (primary) hypertension: Secondary | ICD-10-CM

## 2018-11-04 LAB — POCT INR: INR: 2.9 (ref 2.0–3.0)

## 2018-11-04 NOTE — Progress Notes (Signed)
Clinical Summary [name] is a 85 y.o.female seen today for follow up of the following medical problems   1. Paroxysmal afib -Has not been interested in NOACs.   - no palpitatons. No bleeding on coumadin - compliant with meds  2. Symptomatic bradycardia - no recent symptoms, recent normal device check   3. HTN - she is compliant with meds  4. Hyperlipidemia - 02/2018 TC 132 HDL 42 TG 195 LDL 63  - compliant with statin    SH: daughter of [name] who is also a patient of mine. She has a great grandsone here with her today started kindergarten, a great grandaughter who is 75 yos   Past Medical History:  Diagnosis Date  . Arthritis   . Gastroesophageal reflux disease   . Hearing impairment   . Hyperlipidemia   . Hypertension   . Hypothyroidism   . Impaired glucose tolerance   . Macular degeneration   . Pancreatitis   . Paroxysmal atrial fibrillation ([place]) 2011  . Sick sinus syndrome ([place]) 2011   Atrial fibrilation 10/2009; and dual chamber pacemaker in 4/11; normal EF  . Sleep apnea    Nocturnal oxygen therapy  . Zoster 2016     No Known Allergies   Current Outpatient Medications  Medication Sig Dispense Refill  . Calcium Carb-Cholecalciferol 500-400 MG-UNIT CHEW One tablet twice daily 60 tablet   . DIGOX 125 MCG tablet TAKE 1 TABLET BY MOUTH ONCE A DAY. 90 tablet 3  . fluticasone (FLONASE) 50 MCG/ACT nasal spray USE 1 SPRAY IN EACH NOSTRIL DAILY. 48 g 1  . hydrochlorothiazide (HYDRODIURIL) 25 MG tablet TAKE 1 TABLET BY MOUTH ONCE DAILY. 90 tablet 3  . levothyroxine (SYNTHROID, LEVOTHROID) 112 MCG tablet TAKE 1 TABLET BY MOUTH ONCE DAILY. 30 tablet 5  . lovastatin (MEVACOR) 40 MG tablet TAKE ONE TABLET BY MOUTH DAILY. 30 tablet 0  . metoprolol tartrate (LOPRESSOR) 100 MG tablet TAKE 1 TABLET BY MOUTH TWICE A DAY. 180 tablet 3  . omeprazole (PRILOSEC) 40 MG capsule TAKE (1) CAPSULE BY MOUTH ONCE DAILY FOR ACID REFLUX. 30 capsule 5  .  OXYGEN-HELIUM IN Inhale 2 L into the lungs at bedtime.    . Potassium 99 MG TABS Take 1 tablet by mouth daily.    Marland Kitchen warfarin (COUMADIN) 5 MG tablet Take 1/2 tablet daily except 1 tablet on Wednesdays and Saturdays or as directed 30 tablet 6   No current facility-administered medications for this visit.      Past Surgical History:  Procedure Laterality Date  . ABDOMINAL HYSTERECTOMY    . CATARACT EXTRACTION     Bilateral  . COLONOSCOPY  2009   Dr. Gala Romney  . EYE SURGERY     laser   . PACEMAKER INSERTION  2011   dual chamber   . PPM GENERATOR CHANGEOUT N/A 11/13/2016   Procedure: PPM Generator Changeout;  Surgeon: Evans Lance, MD;  Location: [facility];  Service: Cardiovascular;  Laterality: N/A;  . SALPINGOOPHORECTOMY  1970   right     No Known Allergies    Family History  Problem Relation Age of Onset  . Cancer Mother        gential  . Cancer Father        throat   . Pneumonia Sister   . Emphysema Sister   . Mitral valve prolapse Sister   . Heart disease Unknown   . Arthritis Unknown   . Lung disease Unknown   .  Asthma Unknown   . Melanoma Son      Social History Ms. Legacy reports that she has never smoked. She has never used smokeless tobacco. Ms. Diskin reports no history of alcohol use.   Review of Systems CONSTITUTIONAL: No weight loss, fever, chills, weakness or fatigue.  HEENT: Eyes: No visual loss, blurred vision, double vision or yellow sclerae.No hearing loss, sneezing, congestion, runny nose or sore throat.  SKIN: No rash or itching.  CARDIOVASCULAR: per hpi RESPIRATORY: No shortness of breath, cough or sputum.  GASTROINTESTINAL: No anorexia, nausea, vomiting or diarrhea. No abdominal pain or blood.  GENITOURINARY: No burning on urination, no polyuria NEUROLOGICAL: No headache, dizziness, syncope, paralysis, ataxia, numbness or tingling in the extremities. No change in bowel or bladder control.  MUSCULOSKELETAL: No muscle, back pain,  joint pain or stiffness.  LYMPHATICS: No enlarged nodes. No history of splenectomy.  PSYCHIATRIC: No history of depression or anxiety.  ENDOCRINOLOGIC: No reports of sweating, cold or heat intolerance. No polyuria or polydipsia.  Marland Kitchen   Physical Examination Today's Vitals   11/04/18 1057  BP: 124/80  Pulse: 78  SpO2: 97%  Weight: 148 lb (67.1 kg)  Height: 5\' 6"  (1.676 m)   Body mass index is 23.89 kg/m.  Gen: resting comfortably, no acute distress HEENT: no scleral icterus, pupils equal round and reactive, no palptable cervical adenopathy,  CV: RRR, no m/r/g, no jvd Resp: Clear to auscultation bilaterally GI: abdomen is soft, non-tender, non-distended, normal bowel sounds, no hepatosplenomegaly MSK: extremities are warm, no edema.  Skin: warm, no rash Neuro:  no focal deficits Psych: appropriate affect    Assessment and Plan   1. Afib - CHADS2Vasc score is 4, continue anticoag - no symptoms. Given her advanced age concern for side effects on digoxin, which she has been on very long time. We will d/c, if palpitations would consider adding low dose CCB to her beta blocker  2. HTN -compliant with meds  3. Hyperlipidemia -at goal, continue statin  4. Symptomatic bradycardia - no symptoms, continue to follow in device clinic   F/u 6 months     Arnoldo Lenis, M.D.

## 2018-11-04 NOTE — Patient Instructions (Signed)
Continue coumadin 1/2 tablet daily except 1 tablet only on Wednesdays and Saturdays.  Recheck in 6 weeks Keep intake of greens consistent

## 2018-11-04 NOTE — Patient Instructions (Signed)
Medication Instructions: STOP Digoxin   If you need a refill on your cardiac medications before your next appointment, please call your pharmacy.   Lab work: None today If you have labs (blood work) drawn today and your tests are completely normal, you will receive your results only by: Marland Kitchen MyChart Message (if you have MyChart) OR . A paper copy in the mail If you have any lab test that is abnormal or we need to change your treatment, we will call you to review the results.  Testing/Procedures: None today  Follow-Up: At [facility], you and your health needs are our priority.  As part of our continuing mission to provide you with exceptional heart care, we have created designated Provider Care Teams.  These Care Teams include your primary Cardiologist (physician) and Advanced Practice Providers (APPs -  Physician Assistants and Nurse Practitioners) who all work together to provide you with the care you need, when you need it. You will need a follow up appointment in 6 months.  Please call our office 2 months in advance to schedule this appointment.  You may see Carlyle Dolly, MD or one of the following Advanced Practice Providers on your designated Care Team:   Bernerd Pho, PA-C [facility]) . Ermalinda Barrios, PA-C ([place])  Any Other Special Instructions Will Be Listed Below (If Applicable). None

## 2018-12-02 DIAGNOSIS — R0902 Hypoxemia: Secondary | ICD-10-CM | POA: Diagnosis not present

## 2018-12-16 ENCOUNTER — Ambulatory Visit (INDEPENDENT_AMBULATORY_CARE_PROVIDER_SITE_OTHER): Payer: Medicare Other | Admitting: *Deleted

## 2018-12-16 ENCOUNTER — Other Ambulatory Visit: Payer: Self-pay

## 2018-12-16 DIAGNOSIS — Z5181 Encounter for therapeutic drug level monitoring: Secondary | ICD-10-CM | POA: Diagnosis not present

## 2018-12-16 DIAGNOSIS — I480 Paroxysmal atrial fibrillation: Secondary | ICD-10-CM

## 2018-12-16 LAB — POCT INR: INR: 2.3 (ref 2.0–3.0)

## 2018-12-16 NOTE — Patient Instructions (Signed)
Continue coumadin 1/2 tablet daily except 1 tablet only on Wednesdays and Saturdays.  Recheck in 6 weeks Keep intake of greens consistent

## 2018-12-23 ENCOUNTER — Other Ambulatory Visit: Payer: Self-pay | Admitting: Cardiology

## 2019-01-01 DIAGNOSIS — R0902 Hypoxemia: Secondary | ICD-10-CM | POA: Diagnosis not present

## 2019-01-02 ENCOUNTER — Other Ambulatory Visit: Payer: Self-pay | Admitting: Family Medicine

## 2019-01-08 ENCOUNTER — Other Ambulatory Visit: Payer: Self-pay

## 2019-01-08 ENCOUNTER — Ambulatory Visit (INDEPENDENT_AMBULATORY_CARE_PROVIDER_SITE_OTHER): Payer: Medicare Other | Admitting: *Deleted

## 2019-01-08 DIAGNOSIS — I480 Paroxysmal atrial fibrillation: Secondary | ICD-10-CM

## 2019-01-08 DIAGNOSIS — I495 Sick sinus syndrome: Secondary | ICD-10-CM

## 2019-01-09 LAB — CUP PACEART REMOTE DEVICE CHECK
Battery Remaining Longevity: 120 mo
Battery Remaining Percentage: 95 %
Battery Voltage: 3.01 V
Brady Statistic RV Percent Paced: 84 %
Date Time Interrogation Session: 20200514130546
Implantable Lead Implant Date: 20110422
Implantable Lead Implant Date: 20110422
Implantable Lead Location: 753859
Implantable Lead Location: 753860
Implantable Pulse Generator Implant Date: 20180319
Lead Channel Impedance Value: 410 Ohm
Lead Channel Sensing Intrinsic Amplitude: 2.4 mV
Lead Channel Setting Pacing Amplitude: 2 V
Lead Channel Setting Pacing Pulse Width: 0.5 ms
Lead Channel Setting Sensing Sensitivity: 1.5 mV
Pulse Gen Model: 2272
Pulse Gen Serial Number: 8010902

## 2019-01-27 NOTE — Progress Notes (Signed)
Remote pacemaker transmission.   

## 2019-01-29 ENCOUNTER — Telehealth: Payer: Self-pay | Admitting: *Deleted

## 2019-01-29 ENCOUNTER — Ambulatory Visit (INDEPENDENT_AMBULATORY_CARE_PROVIDER_SITE_OTHER): Payer: Medicare Other | Admitting: *Deleted

## 2019-01-29 DIAGNOSIS — I480 Paroxysmal atrial fibrillation: Secondary | ICD-10-CM | POA: Diagnosis not present

## 2019-01-29 DIAGNOSIS — Z5181 Encounter for therapeutic drug level monitoring: Secondary | ICD-10-CM | POA: Diagnosis not present

## 2019-01-29 LAB — POCT INR: INR: 3.2 — AB (ref 2.0–3.0)

## 2019-01-29 NOTE — Telephone Encounter (Signed)
[name] -daughter called stating that she needs to speak with Edrick Oh, RN in regards to her coumdin visit today.   Please call [phone].

## 2019-01-29 NOTE — Telephone Encounter (Signed)
Spoke with daughter to clarify coumadin dosage.  She verbalized understanding.

## 2019-01-29 NOTE — Patient Instructions (Signed)
Take 1/2 tablet tonight then resume 1/2 tablet daily except 1 tablet only on Wednesdays and Saturdays.  Recheck in 6 weeks Keep intake of greens consistent

## 2019-02-01 DIAGNOSIS — R0902 Hypoxemia: Secondary | ICD-10-CM | POA: Diagnosis not present

## 2019-02-10 ENCOUNTER — Telehealth: Payer: Self-pay | Admitting: Internal Medicine

## 2019-02-10 NOTE — Telephone Encounter (Signed)

## 2019-02-12 ENCOUNTER — Telehealth (INDEPENDENT_AMBULATORY_CARE_PROVIDER_SITE_OTHER): Payer: Medicare Other | Admitting: Internal Medicine

## 2019-02-12 ENCOUNTER — Other Ambulatory Visit: Payer: Self-pay

## 2019-02-12 ENCOUNTER — Encounter: Payer: Self-pay | Admitting: Internal Medicine

## 2019-02-12 VITALS — Ht 65.0 in | Wt 151.0 lb

## 2019-02-12 DIAGNOSIS — I482 Chronic atrial fibrillation, unspecified: Secondary | ICD-10-CM

## 2019-02-12 DIAGNOSIS — I10 Essential (primary) hypertension: Secondary | ICD-10-CM | POA: Diagnosis not present

## 2019-02-12 NOTE — Progress Notes (Signed)
Electrophysiology TeleHealth Note   Due to national recommendations of social distancing due to COVID 19, an audio/video telehealth visit is felt to be most appropriate for this patient at this time.  See MyChart message from today for the patient's consent to telehealth for [facility].   Date:  2/69/4854   ID:  REE ALCALDE, DOB [birth year], MRN [id number]  Location: patient's home  Provider location: [address] [place]  Evaluation Performed: Follow-up visit  PCP:  Fayrene Helper, MD  Cardiologist:  Carlyle Dolly, MD Electrophysiologist:  Dr Lovena Le  Chief Complaint:  "I'm doing alright."  History of Present Illness:    [name] is a 85 y.o. female who presents via audio/video conferencing for a telehealth visit today.  She is a pleasant 85 yo Since last being seen in our clinic, the patient reports doing very well.  Today, she denies symptoms of palpitations, chest pain, shortness of breath,  lower extremity edema, dizziness, presyncope, or syncope.  The patient is otherwise without complaint today.  The patient denies symptoms of fevers, chills, cough, or new SOB worrisome for COVID 19.  Past Medical History:  Diagnosis Date  . Arthritis   . Gastroesophageal reflux disease   . Hearing impairment   . Hyperlipidemia   . Hypertension   . Hypothyroidism   . Impaired glucose tolerance   . Macular degeneration   . Pancreatitis   . Paroxysmal atrial fibrillation ([place]) 2011  . Sick sinus syndrome ([place]) 2011   Atrial fibrilation 10/2009; and dual chamber pacemaker in 4/11; normal EF  . Sleep apnea    Nocturnal oxygen therapy  . Zoster 2016    Past Surgical History:  Procedure Laterality Date  . ABDOMINAL HYSTERECTOMY    . CATARACT EXTRACTION     Bilateral  . COLONOSCOPY  2009   Dr. Gala Romney  . EYE SURGERY     laser   . PACEMAKER INSERTION  2011   dual chamber   . PPM GENERATOR CHANGEOUT N/A 11/13/2016   Procedure: PPM Generator  Changeout;  Surgeon: Evans Lance, MD;  Location: [place] [facility];  Service: Cardiovascular;  Laterality: N/A;  . SALPINGOOPHORECTOMY  1970   right    Current Outpatient Medications  Medication Sig Dispense Refill  . Calcium Carb-Cholecalciferol 500-400 MG-UNIT CHEW One tablet twice daily 60 tablet   . fluticasone (FLONASE) 50 MCG/ACT nasal spray USE 1 SPRAY IN EACH NOSTRIL DAILY. 48 g 1  . hydrochlorothiazide (HYDRODIURIL) 25 MG tablet TAKE 1 TABLET BY MOUTH ONCE DAILY. 90 tablet 3  . levothyroxine (SYNTHROID) 112 MCG tablet TAKE 1 TABLET BY MOUTH ONCE DAILY. 90 tablet 0  . lovastatin (MEVACOR) 40 MG tablet TAKE ONE TABLET BY MOUTH DAILY. 30 tablet 6  . metoprolol tartrate (LOPRESSOR) 100 MG tablet TAKE 1 TABLET BY MOUTH TWICE A DAY. 180 tablet 3  . omeprazole (PRILOSEC) 40 MG capsule TAKE (1) CAPSULE BY MOUTH ONCE DAILY FOR ACID REFLUX. 30 capsule 5  . OXYGEN-HELIUM IN Inhale 2 L into the lungs at bedtime.    . Potassium 99 MG TABS Take 1 tablet by mouth daily.    Marland Kitchen warfarin (COUMADIN) 5 MG tablet Take 1/2 tablet daily except 1 tablet on Wednesdays and Saturdays or as directed 30 tablet 6   No current facility-administered medications for this visit.     Allergies:   Patient has no known allergies.   Social History:  The patient  reports that she has  never smoked. She has never used smokeless tobacco. She reports that she does not drink alcohol or use drugs.   Family History:  The patient's  family history includes Arthritis in an other family member; Asthma in an other family member; Cancer in her father and mother; Emphysema in her sister; Heart disease in an other family member; Lung disease in an other family member; Melanoma in her son; Mitral valve prolapse in her sister; Pneumonia in her sister.   ROS:  Please see the history of present illness.   All other systems are personally reviewed and negative.    Exam:    Vital Signs:  Ht 5\' 5"  (1.651 m)   Wt 151 lb (68.5 kg)    BMI 25.13 kg/m    Labs/Other Tests and Data Reviewed:    Recent Labs: 03/20/2018: ALT 10; BUN 22; Creat 1.08; Potassium 4.1; Sodium 136; TSH 0.46   Wt Readings from Last 3 Encounters:  02/12/19 151 lb (68.5 kg)  11/04/18 148 lb (67.1 kg)  08/08/18 151 lb (68.5 kg)     Other studies personally reviewed:  Last device remote is reviewed from [place] PDF dated 5/20 which reveals normal device function, no arrhythmias    ASSESSMENT & PLAN:    1.  Atrial fib -  Her VR is well controlled. She is asymptomatic. 2. PPM- her [employer] VVVIR PPM is working normally based on her last interogation a month ago. 3. HTN - her blood pressure was not checked today. Her daughter tells me that it has been good over the past month. 4. COVID 19 screen The patient denies symptoms of COVID 19 at this time.  The importance of social distancing was discussed today.  Follow-up:  6 months Next remote: 8/20  Current medicines are reviewed at length with the patient today.   The patient does not have concerns regarding her medicines.  The following changes were made today:  none  Labs/ tests ordered today include: none No orders of the defined types were placed in this encounter.    Patient Risk:  after full review of this patients clinical status, I feel that they are at moderate risk at this time.  Today, I have spent 15 minutes with the patient with telehealth technology discussing all of the above .    Signed, Cristopher Peru, MD  02/12/2019 10:35 AM     [facility] [address] [place] [address] [phone] (office) [phone] (fax)

## 2019-02-12 NOTE — Patient Instructions (Signed)
Medication Instructions:  Your physician recommends that you continue on your current medications as directed. Please refer to the Current Medication list given to you today.  If you need a refill on your cardiac medications before your next appointment, please call your pharmacy.   Lab work: NONE  If you have labs (blood work) drawn today and your tests are completely normal, you will receive your results only by: Marland Kitchen MyChart Message (if you have MyChart) OR . A paper copy in the mail If you have any lab test that is abnormal or we need to change your treatment, we will call you to review the results.  Testing/Procedures: NONE   Follow-Up: At [facility], you and your health needs are our priority.  As part of our continuing mission to provide you with exceptional heart care, we have created designated Provider Care Teams.  These Care Teams include your primary Cardiologist (physician) and Advanced Practice Providers (APPs -  Physician Assistants and Nurse Practitioners) who all work together to provide you with the care you need, when you need it. You will need a follow up appointment in 6 months.  Please call our office 2 months in advance to schedule this appointment.  You may see Dr. Lovena Le or one of the following Advanced Practice Providers on your designated Care Team:   Chanetta Marshall, NP . Tommye Standard, PA-C Remote monitoring is used to monitor your Pacemaker of ICD from home. This monitoring reduces the number of office visits required to check your device to one time per year. It allows [place] to keep an eye on the functioning of your device to ensure it is working properly. You are scheduled for a device check from home on 04/17/19. You may send your transmission at any time that day. If you have a wireless device, the transmission will be sent automatically. After your physician reviews your transmission, you will receive a postcard with your next transmission date.    Any Other  Special Instructions Will Be Listed Below (If Applicable). Thank you for choosing [facility]!

## 2019-02-24 ENCOUNTER — Ambulatory Visit: Payer: Medicare Other | Admitting: Family Medicine

## 2019-03-03 DIAGNOSIS — R0902 Hypoxemia: Secondary | ICD-10-CM | POA: Diagnosis not present

## 2019-03-10 ENCOUNTER — Ambulatory Visit (INDEPENDENT_AMBULATORY_CARE_PROVIDER_SITE_OTHER): Payer: Medicare Other | Admitting: Family Medicine

## 2019-03-10 ENCOUNTER — Other Ambulatory Visit: Payer: Self-pay

## 2019-03-10 VITALS — BP 124/70 | HR 87 | Resp 15 | Ht 66.0 in | Wt 152.0 lb

## 2019-03-10 DIAGNOSIS — E559 Vitamin D deficiency, unspecified: Secondary | ICD-10-CM | POA: Diagnosis not present

## 2019-03-10 DIAGNOSIS — E785 Hyperlipidemia, unspecified: Secondary | ICD-10-CM | POA: Diagnosis not present

## 2019-03-10 DIAGNOSIS — E038 Other specified hypothyroidism: Secondary | ICD-10-CM | POA: Diagnosis not present

## 2019-03-10 DIAGNOSIS — Z9181 History of falling: Secondary | ICD-10-CM

## 2019-03-10 DIAGNOSIS — I10 Essential (primary) hypertension: Secondary | ICD-10-CM

## 2019-03-10 DIAGNOSIS — G4734 Idiopathic sleep related nonobstructive alveolar hypoventilation: Secondary | ICD-10-CM

## 2019-03-10 DIAGNOSIS — I480 Paroxysmal atrial fibrillation: Secondary | ICD-10-CM

## 2019-03-10 NOTE — Patient Instructions (Signed)
F//u for Annual exam in December`,call if you need me before   Please get fasting CBC, lipid, cmp and EGFr, TSH and vit D  No MORE falls, please wear socks every day

## 2019-03-12 ENCOUNTER — Ambulatory Visit (INDEPENDENT_AMBULATORY_CARE_PROVIDER_SITE_OTHER): Payer: Medicare Other | Admitting: *Deleted

## 2019-03-12 DIAGNOSIS — Z5181 Encounter for therapeutic drug level monitoring: Secondary | ICD-10-CM

## 2019-03-12 DIAGNOSIS — I480 Paroxysmal atrial fibrillation: Secondary | ICD-10-CM | POA: Diagnosis not present

## 2019-03-12 LAB — POCT INR: INR: 2.6 (ref 2.0–3.0)

## 2019-03-12 NOTE — Patient Instructions (Signed)
Continue coumadin 1/2 tablet daily except 1 tablet only on Wednesdays and Saturdays.  Recheck in 6 weeks Keep intake of greens consistent

## 2019-03-13 ENCOUNTER — Encounter: Payer: Self-pay | Admitting: Family Medicine

## 2019-03-13 NOTE — Progress Notes (Signed)
   [name]     MRN: [id number]      DOB: 1926-08-10   HPI [name] is here for follow up and re-evaluation of chronic medical conditions, medication management and review of any available recent lab and radiology data.  Preventive health is updated, specifically  Cancer screening and Immunization.    The PT denies any adverse reactions to current medications since the last visit.  There are no new concerns.  There are no specific complaints , doing well overall, however recently fell, due to ill fitting shoes, needs to wear socks and is encouraged to do so  ROS Denies recent fever or chills. Denies sinus pressure, nasal congestion, ear pain or sore throat. Denies chest congestion, productive cough or wheezing. Denies chest pains, palpitations and leg swelling Denies abdominal pain, nausea, vomiting,diarrhea or constipation.   Denies dysuria, frequency, hesitancy or incontinence. C/o chronic  joint pain, swelling and limitation in mobility.not disabling or interfering with sleep Denies headaches, seizures, numbness, or tingling. Denies depression, anxiety or insomnia. Denies skin break down or rash.   PE  BP 124/70   Pulse 87   Resp 15   Ht 5\' 6"  (1.676 m)   Wt 152 lb (68.9 kg)   SpO2 98%   BMI 24.53 kg/m   Patient alert and oriented and in no cardiopulmonary distress.  HEENT: No facial asymmetry, EOMI,   oropharynx pink and moist.  Neck supple no JVD, no mass.  Chest: Clear to auscultation bilaterally.decreased air entry  CVS: S1, S2 no murmurs, no S3.Regular rate.  ABD: Soft non tender.   Ext: No edema  MS: Adequate though reduced  ROM spine, shoulders, hips and knees.  Skin: Intact, no ulcerations or rash noted.  Psych: Good eye contact, normal affect. Memory intact not anxious or depressed appearing.  CNS: CN 2-12 intact, power,  normal throughout.no focal deficits noted.   Assessment & Plan  Essential hypertension Controlled, no change in  medication DASH diet and commitment to daily physical activity for a minimum of 30 minutes discussed and encouraged, as a part of hypertension management. The importance of attaining a healthy weight is also discussed.  BP/Weight 03/10/2019 02/12/2019 11/04/2018 08/08/2018 06/17/2018 04/22/2018 2/95/1884  Systolic BP 166 - 063 016 010 932 355  Diastolic BP 70 - 80 80 65 64 60  Wt. (Lbs) 152 151 148 151 151 153 154.12  BMI 24.53 25.13 23.89 24.37 24.37 24.69 24.88       Hyperlipidemia Hyperlipidemia:Low fat diet discussed and encouraged.Not at [facility] when last tested   Lipid Panel  Lab Results  Component Value Date   CHOL 132 03/20/2018   HDL 42 (L) 03/20/2018   LDLCALC 63 03/20/2018   TRIG 195 (H) 03/20/2018   CHOLHDL 3.1 03/20/2018     Updated lab needed at/ before next visit.   Hypothyroidism Controlled when last checked , updated lab needed  Nocturnal hypoxia Continue supplemental oxygen  At high risk for falls Recent fall at home Home safety reviewed , in particular the need to wear well fitting shoes  Paroxysmal atrial fibrillation ([place]) Rate controlled with pacemeker

## 2019-03-13 NOTE — Assessment & Plan Note (Signed)
Controlled, no change in medication DASH diet and commitment to daily physical activity for a minimum of 30 minutes discussed and encouraged, as a part of hypertension management. The importance of attaining a healthy weight is also discussed.  BP/Weight 03/10/2019 02/12/2019 11/04/2018 08/08/2018 06/17/2018 04/22/2018 9/37/3428  Systolic BP 768 - 115 726 203 559 741  Diastolic BP 70 - 80 80 65 64 60  Wt. (Lbs) 152 151 148 151 151 153 154.12  BMI 24.53 25.13 23.89 24.37 24.37 24.69 24.88

## 2019-03-13 NOTE — Assessment & Plan Note (Signed)
Rate controlled with pacemeker

## 2019-03-13 NOTE — Assessment & Plan Note (Signed)
Recent fall at home Home safety reviewed , in particular the need to wear well fitting shoes

## 2019-03-13 NOTE — Assessment & Plan Note (Signed)
Continue supplemental oxygen. 

## 2019-03-13 NOTE — Assessment & Plan Note (Signed)
Controlled when last checked , updated lab needed

## 2019-03-13 NOTE — Assessment & Plan Note (Addendum)
Hyperlipidemia:Low fat diet discussed and encouraged.Not at [facility] when last tested   Lipid Panel  Lab Results  Component Value Date   CHOL 132 03/20/2018   HDL 42 (L) 03/20/2018   LDLCALC 63 03/20/2018   TRIG 195 (H) 03/20/2018   CHOLHDL 3.1 03/20/2018     Updated lab needed at/ before next visit.

## 2019-04-01 ENCOUNTER — Other Ambulatory Visit: Payer: Self-pay | Admitting: Family Medicine

## 2019-04-03 DIAGNOSIS — R0902 Hypoxemia: Secondary | ICD-10-CM | POA: Diagnosis not present

## 2019-04-04 ENCOUNTER — Other Ambulatory Visit: Payer: Self-pay | Admitting: Family Medicine

## 2019-04-09 ENCOUNTER — Ambulatory Visit (INDEPENDENT_AMBULATORY_CARE_PROVIDER_SITE_OTHER): Payer: Medicare Other | Admitting: *Deleted

## 2019-04-09 ENCOUNTER — Other Ambulatory Visit: Payer: Self-pay

## 2019-04-09 ENCOUNTER — Encounter (INDEPENDENT_AMBULATORY_CARE_PROVIDER_SITE_OTHER): Payer: Self-pay

## 2019-04-09 DIAGNOSIS — I495 Sick sinus syndrome: Secondary | ICD-10-CM | POA: Diagnosis not present

## 2019-04-10 LAB — CUP PACEART REMOTE DEVICE CHECK
Battery Remaining Longevity: 117 mo
Battery Remaining Percentage: 95.5 %
Battery Voltage: 3.01 V
Brady Statistic RV Percent Paced: 77 %
Date Time Interrogation Session: 20200812153420
Implantable Lead Implant Date: 20110422
Implantable Lead Implant Date: 20110422
Implantable Lead Location: 753859
Implantable Lead Location: 753860
Implantable Pulse Generator Implant Date: 20180319
Lead Channel Impedance Value: 430 Ohm
Lead Channel Pacing Threshold Amplitude: 1 V
Lead Channel Pacing Threshold Pulse Width: 0.5 ms
Lead Channel Sensing Intrinsic Amplitude: 3.6 mV
Lead Channel Setting Pacing Amplitude: 2 V
Lead Channel Setting Pacing Pulse Width: 0.5 ms
Lead Channel Setting Sensing Sensitivity: 1.5 mV
Pulse Gen Model: 2272
Pulse Gen Serial Number: 8010902

## 2019-04-13 DIAGNOSIS — L237 Allergic contact dermatitis due to plants, except food: Secondary | ICD-10-CM | POA: Diagnosis not present

## 2019-04-18 ENCOUNTER — Encounter: Payer: Self-pay | Admitting: Cardiology

## 2019-04-18 NOTE — Progress Notes (Signed)
Remote pacemaker transmission.   

## 2019-04-23 ENCOUNTER — Ambulatory Visit (INDEPENDENT_AMBULATORY_CARE_PROVIDER_SITE_OTHER): Payer: Medicare Other | Admitting: *Deleted

## 2019-04-23 ENCOUNTER — Other Ambulatory Visit: Payer: Self-pay

## 2019-04-23 DIAGNOSIS — I480 Paroxysmal atrial fibrillation: Secondary | ICD-10-CM

## 2019-04-23 DIAGNOSIS — Z5181 Encounter for therapeutic drug level monitoring: Secondary | ICD-10-CM

## 2019-04-23 LAB — POCT INR: INR: 1.8 — AB (ref 2.0–3.0)

## 2019-04-23 NOTE — Patient Instructions (Signed)
Take coumadin 1 1/2 tablets tonight then resume 1/2 tablet daily except 1 tablet on Wednesdays and Saturdays.  Recheck in 6 weeks Keep intake of greens consistent

## 2019-05-04 DIAGNOSIS — R0902 Hypoxemia: Secondary | ICD-10-CM | POA: Diagnosis not present

## 2019-05-12 ENCOUNTER — Encounter: Payer: Self-pay | Admitting: Cardiology

## 2019-05-12 ENCOUNTER — Telehealth (INDEPENDENT_AMBULATORY_CARE_PROVIDER_SITE_OTHER): Payer: Medicare Other | Admitting: Cardiology

## 2019-05-12 VITALS — Ht 66.0 in | Wt 153.0 lb

## 2019-05-12 DIAGNOSIS — I10 Essential (primary) hypertension: Secondary | ICD-10-CM

## 2019-05-12 DIAGNOSIS — E782 Mixed hyperlipidemia: Secondary | ICD-10-CM

## 2019-05-12 DIAGNOSIS — R001 Bradycardia, unspecified: Secondary | ICD-10-CM

## 2019-05-12 DIAGNOSIS — I4891 Unspecified atrial fibrillation: Secondary | ICD-10-CM

## 2019-05-12 NOTE — Patient Instructions (Signed)
Medication Instructions: Your physician recommends that you continue on your current medications as directed. Please refer to the Current Medication list given to you today.   Labwork: none  Procedures/Testing: none  Follow-Up: 6 months with Dr.Branch  Any Additional Special Instructions Will Be Listed Below (If Applicable).     If you need a refill on your cardiac medications before your next appointment, please call your pharmacy.     Thank you for choosing [facility] !        

## 2019-05-12 NOTE — Progress Notes (Signed)
Virtual Visit via Telephone Note   This visit type was conducted due to national recommendations for restrictions regarding the COVID-19 Pandemic (e.g. social distancing) in an effort to limit this patient's exposure and mitigate transmission in our community.  Due to her co-morbid illnesses, this patient is at least at moderate risk for complications without adequate follow up.  This format is felt to be most appropriate for this patient at this time.  The patient did not have access to video technology/had technical difficulties with video requiring transitioning to audio format only (telephone).  All issues noted in this document were discussed and addressed.  No physical exam could be performed with this format.  Please refer to the patient's chart for her  consent to telehealth for [facility].   Date:  [id number]   ID:  [name], DOB [id number], MRN [id number]  Patient Location: Home Provider Location: Office  PCP:  Fayrene Helper, MD  Cardiologist:  Carlyle Dolly, MD  Electrophysiologist:  None   Evaluation Performed:  Follow-Up Visit  Chief Complaint:  Follow up  History of Present Illness:    [name] is a 85 y.o. female  seen today for follow up of the following medical problems   1. Paroxysmal afib -Has not been interested in NOACs.  - no recent palpitations - no bleeding on coumadin - compliant with meds  2. Symptomatic bradycardia - normal device check during EP visit 01/2019 and remotely 03/2019 -no recent symptoms.   3. HTN - compliant with meds. Recent bp at 03/2019 urgent care visit 120s/80s  4. Hyperlipidemia - 02/2018 TC 132 HDL 42 TG 195 LDL 63 -she is compliant with statin - family holding off on repeat labs due to covid19 risks    SH: daughter of [name] who is also a patient of mine. She has a great grandsone here with her today started kindergarten, a great grandaughter who is 60 yos  The patient does  not have symptoms concerning for COVID-19 infection (fever, chills, cough, or new shortness of breath).    Past Medical History:  Diagnosis Date  . Arthritis   . Gastroesophageal reflux disease   . Hearing impairment   . Hyperlipidemia   . Hypertension   . Hypothyroidism   . Impaired glucose tolerance   . Macular degeneration   . Pancreatitis   . Paroxysmal atrial fibrillation ([place]) 2011  . Sick sinus syndrome ([place]) 2011   Atrial fibrilation 10/2009; and dual chamber pacemaker in 4/11; normal EF  . Sleep apnea    Nocturnal oxygen therapy  . Zoster 2016   Past Surgical History:  Procedure Laterality Date  . ABDOMINAL HYSTERECTOMY    . CATARACT EXTRACTION     Bilateral  . COLONOSCOPY  2009   Dr. Gala Romney  . EYE SURGERY     laser   . PACEMAKER INSERTION  2011   dual chamber   . PPM GENERATOR CHANGEOUT N/A 11/13/2016   Procedure: PPM Generator Changeout;  Surgeon: Evans Lance, MD;  Location: [place] [facility];  Service: Cardiovascular;  Laterality: N/A;  . SALPINGOOPHORECTOMY  1970   right     Current Meds  Medication Sig  . Calcium Carb-Cholecalciferol 500-400 MG-UNIT CHEW One tablet twice daily  . fluticasone (FLONASE) 50 MCG/ACT nasal spray USE 1 SPRAY IN EACH NOSTRIL DAILY.  . hydrochlorothiazide (HYDRODIURIL) 25 MG tablet TAKE 1 TABLET BY MOUTH ONCE DAILY.  Marland Kitchen levothyroxine (SYNTHROID) 112 MCG tablet TAKE 1 TABLET  BY MOUTH ONCE DAILY.  Marland Kitchen lovastatin (MEVACOR) 40 MG tablet TAKE ONE TABLET BY MOUTH DAILY.  . metoprolol tartrate (LOPRESSOR) 100 MG tablet TAKE 1 TABLET BY MOUTH TWICE A DAY.  Marland Kitchen omeprazole (PRILOSEC) 40 MG capsule TAKE (1) CAPSULE BY MOUTH ONCE DAILY FOR ACID REFLUX.  Marland Kitchen OXYGEN-HELIUM IN Inhale 2 L into the lungs at bedtime.  . Potassium 99 MG TABS Take 1 tablet by mouth daily.  Marland Kitchen warfarin (COUMADIN) 5 MG tablet Take 1/2 tablet daily except 1 tablet on Wednesdays and Saturdays or as directed     Allergies:   Patient has no known allergies.   Social  History   Tobacco Use  . Smoking status: Never Smoker  . Smokeless tobacco: Never Used  Substance Use Topics  . Alcohol use: No    Alcohol/week: 0.0 standard drinks  . Drug use: No     Family Hx: The patient's family history includes Arthritis in an other family member; Asthma in an other family member; Cancer in her father and mother; Emphysema in her sister; Heart disease in an other family member; Lung disease in an other family member; Melanoma in her son; Mitral valve prolapse in her sister; Pneumonia in her sister.  ROS:   Please see the history of present illness.     All other systems reviewed and are negative.   Prior CV studies:   The following studies were reviewed today:   Labs/Other Tests and Data Reviewed:    EKG:  No ECG reviewed.  Recent Labs: No results found for requested labs within last 8760 hours.   Recent Lipid Panel Lab Results  Component Value Date/Time   CHOL 132 03/20/2018 10:36 AM   TRIG 195 (H) 03/20/2018 10:36 AM   HDL 42 (L) 03/20/2018 10:36 AM   CHOLHDL 3.1 03/20/2018 10:36 AM   LDLCALC 63 03/20/2018 10:36 AM    Wt Readings from Last 3 Encounters:  05/12/19 153 lb (69.4 kg)  03/10/19 152 lb (68.9 kg)  02/12/19 151 lb (68.5 kg)     Objective:    Vital Signs:  Ht 5\' 6"  (1.676 m)   Wt 153 lb (69.4 kg)   BMI 24.69 kg/m    Normal affect. Normal speech pattern and tone. Comfortable, no apparent distress. No audible signs of SOB or wheezing.   ASSESSMENT & PLAN:    1. Afib - no symptoms, continue current meds. She has not been interested in NOACs, continue coumadin  2. HTN - has been at goal, continue current meds  3. Hyperlipidemia -LDL at goal, continue statin  4. Symptomatic bradycardia - no recent symptoms, normal device check 03/2019. Continue to monitor.   COVID-19 Education: The signs and symptoms of COVID-19 were discussed with the patient and how to seek care for testing (follow up with PCP or arrange E-visit).   The importance of social distancing was discussed today.  Time:   Today, I have spent 14 minutes with the patient with telehealth technology discussing the above problems.     Medication Adjustments/Labs and Tests Ordered: Current medicines are reviewed at length with the patient today.  Concerns regarding medicines are outlined above.   Tests Ordered: No orders of the defined types were placed in this encounter.   Medication Changes: No orders of the defined types were placed in this encounter.   Follow Up:  In Person in 6 month(s)  Signed, Carlyle Dolly, MD  05/12/2019 11:18 AM    [place]

## 2019-05-14 ENCOUNTER — Telehealth: Payer: Self-pay | Admitting: Family Medicine

## 2019-05-14 NOTE — Telephone Encounter (Signed)
pls call her daughter [name], let her know I received a message from Dr Harl Bowie re stated concern of several falls in recent times Please refer her for in home or at physical therapy dept, whichever family prefers and is possible PT twice weekly x  6 weeks I tried to speak with daughter and could not even leave a message Thanks ?? Please ask

## 2019-05-15 ENCOUNTER — Ambulatory Visit (INDEPENDENT_AMBULATORY_CARE_PROVIDER_SITE_OTHER): Payer: Medicare Other | Admitting: Family Medicine

## 2019-05-15 ENCOUNTER — Other Ambulatory Visit: Payer: Self-pay

## 2019-05-15 ENCOUNTER — Encounter: Payer: Self-pay | Admitting: Family Medicine

## 2019-05-15 VITALS — BP 122/70 | HR 81 | Temp 97.5°F | Ht 66.0 in | Wt 157.0 lb

## 2019-05-15 DIAGNOSIS — I10 Essential (primary) hypertension: Secondary | ICD-10-CM | POA: Diagnosis not present

## 2019-05-15 DIAGNOSIS — E559 Vitamin D deficiency, unspecified: Secondary | ICD-10-CM | POA: Diagnosis not present

## 2019-05-15 DIAGNOSIS — R42 Dizziness and giddiness: Secondary | ICD-10-CM

## 2019-05-15 DIAGNOSIS — S50312A Abrasion of left elbow, initial encounter: Secondary | ICD-10-CM

## 2019-05-15 DIAGNOSIS — E785 Hyperlipidemia, unspecified: Secondary | ICD-10-CM | POA: Diagnosis not present

## 2019-05-15 DIAGNOSIS — E038 Other specified hypothyroidism: Secondary | ICD-10-CM

## 2019-05-15 DIAGNOSIS — Z9181 History of falling: Secondary | ICD-10-CM

## 2019-05-15 DIAGNOSIS — Z23 Encounter for immunization: Secondary | ICD-10-CM | POA: Diagnosis not present

## 2019-05-15 DIAGNOSIS — Z7901 Long term (current) use of anticoagulants: Secondary | ICD-10-CM

## 2019-05-15 MED ORDER — MECLIZINE HCL 12.5 MG PO TABS: 12.5000 mg | ORAL_TABLET | Freq: Two times a day (BID) | ORAL | 0 refills | Status: DC | PRN

## 2019-05-15 NOTE — Patient Instructions (Addendum)
Keep appointments already scheduled, call if you need me sooner  Flu vaccine today  You are referred to in home physical therapy, , special request for "Joey"  Please get labs ordered today, if possible, if not within the next 1 week, we need to see them!  Nurse to cover abrasion on left elbow with dry gauze and paper tape  Please get an x ray of your left elbow today, and we will call with the appointment information for the [place] of your neck arteries as soon as we get appointment information  Meclizine is prescribed for use, if needed, should experience dizziness / a feeling as though either you, or your environment is spinning, this may cause light headedness, so ONLY use if you experience spinning

## 2019-05-15 NOTE — Telephone Encounter (Signed)
Pt in office today for appt. Will [facility] to [facility] and request [facility] per pt preference

## 2019-05-16 LAB — COMPLETE METABOLIC PANEL WITH GFR
AG Ratio: 1.5 (calc) (ref 1.0–2.5)
ALT: 8 U/L (ref 6–29)
AST: 15 U/L (ref 10–35)
Albumin: 4.4 g/dL (ref 3.6–5.1)
Alkaline phosphatase (APISO): 73 U/L (ref 37–153)
BUN/Creatinine Ratio: 22 (calc) (ref 6–22)
BUN: 25 mg/dL (ref 7–25)
CO2: 30 mmol/L (ref 20–32)
Calcium: 10.7 mg/dL — ABNORMAL HIGH (ref 8.6–10.4)
Chloride: 95 mmol/L — ABNORMAL LOW (ref 98–110)
Creat: 1.15 mg/dL — ABNORMAL HIGH (ref 0.60–0.88)
GFR, Est African American: 48 mL/min/{1.73_m2} — ABNORMAL LOW (ref >=60)
GFR, Est Non African American: 42 mL/min/{1.73_m2} — ABNORMAL LOW (ref >=60)
Globulin: 3 g/dL (calc) (ref 1.9–3.7)
Glucose, Bld: 105 mg/dL — ABNORMAL HIGH (ref 65–99)
Potassium: 4.9 mmol/L (ref 3.5–5.3)
Sodium: 133 mmol/L — ABNORMAL LOW (ref 135–146)
Total Bilirubin: 0.7 mg/dL (ref 0.2–1.2)
Total Protein: 7.4 g/dL (ref 6.1–8.1)

## 2019-05-16 LAB — LIPID PANEL
Cholesterol: 133 mg/dL (ref <200)
HDL: 64 mg/dL (ref >=50)
LDL Cholesterol (Calc): 49 mg/dL (calc)
Non-HDL Cholesterol (Calc): 69 mg/dL (calc) (ref <130)
Total CHOL/HDL Ratio: 2.1 (calc) (ref <5.0)
Triglycerides: 110 mg/dL (ref <150)

## 2019-05-16 LAB — CBC
HCT: 38.1 % (ref 35.0–45.0)
Hemoglobin: 12.7 g/dL (ref 11.7–15.5)
MCH: 29.1 pg (ref 27.0–33.0)
MCHC: 33.3 g/dL (ref 32.0–36.0)
MCV: 87.2 fL (ref 80.0–100.0)
MPV: 9.7 fL (ref 7.5–12.5)
Platelets: 357 10*3/uL (ref 140–400)
RBC: 4.37 10*6/uL (ref 3.80–5.10)
RDW: 12.8 % (ref 11.0–15.0)
WBC: 11.7 10*3/uL — ABNORMAL HIGH (ref 3.8–10.8)

## 2019-05-16 LAB — VITAMIN D 25 HYDROXY (VIT D DEFICIENCY, FRACTURES): Vit D, 25-Hydroxy: 51 ng/mL (ref 30–100)

## 2019-05-16 LAB — TSH: TSH: 2.12 mIU/L (ref 0.40–4.50)

## 2019-05-18 ENCOUNTER — Encounter: Payer: Self-pay | Admitting: Family Medicine

## 2019-05-18 DIAGNOSIS — R42 Dizziness and giddiness: Secondary | ICD-10-CM | POA: Insufficient documentation

## 2019-05-18 DIAGNOSIS — S50312A Abrasion of left elbow, initial encounter: Secondary | ICD-10-CM | POA: Insufficient documentation

## 2019-05-18 NOTE — Assessment & Plan Note (Signed)
At risk of needing to discontinuing anticoag meds due to recurrent fallls, will need to monitor closely. Cardiology aware also

## 2019-05-18 NOTE — Assessment & Plan Note (Signed)
H/o recurrent falls and reduced mobility family , reports 5 falls in past 2 months, has benefited in the past from in home PT refer for same for 6 weeks, twice weekly

## 2019-05-18 NOTE — Assessment & Plan Note (Signed)
Light headedness with recurrent falls , refer for carotid US

## 2019-05-18 NOTE — Assessment & Plan Note (Signed)
Hyperlipidemia:Low fat diet discussed and encouraged.   Lipid Panel  Lab Results  Component Value Date   CHOL 133 05/15/2019   HDL 64 05/15/2019   LDLCALC 49 05/15/2019   TRIG 110 05/15/2019   CHOLHDL 2.1 05/15/2019   Controlled, no change in medication

## 2019-05-18 NOTE — Assessment & Plan Note (Signed)
Possible vertigo contributing to falls, limited amount of antivert prescribed and family counseled re possible sedating s/e , therefore the need to avoid overuse and exercise caution

## 2019-05-18 NOTE — Assessment & Plan Note (Signed)
Controlled, no change in medication  

## 2019-05-18 NOTE — Progress Notes (Signed)
   [name]     MRN: [id number]      DOB: 06/26/1926   HPI [name] is here for follow up and re-evaluation of chronic medical conditions, medication management and review of any available recent lab and radiology data.  Main c/o recurrent falls , 5 times in past 2 months, and she sustained soft tissue injury as a result of all falls. C/o back trauma, most recent is left elbow trauma with abrasion to the elbow, daughters concerned about possible fracture C/o light headedness or possible vertigo, just prior to recent fall, unclear based on history, and denies that this is a recurrent problem  ROS Denies recent fever or chills. Denies sinus pressure, nasal congestion, ear pain or sore throat. Denies chest congestion, productive cough or wheezing. Denies chest pains, palpitations and leg swelling Denies abdominal pain, nausea, vomiting,diarrhea or constipation.   Denies dysuria, frequency, hesitancy or incontinence. C/o chronic  joint pain,  and limitation in mobility.with increased fall frequency in recent times Denies headaches, seizures, numbness, or tingling. Denies depression, anxiety or insomnia. Denies skin break down or rash.   PE  BP 122/70   Pulse 81   Temp (!) 97.5 F (36.4 C) (Temporal)   Ht 5\' 6"  (1.676 m)   Wt 157 lb (71.2 kg)   SpO2 96%   BMI 25.34 kg/m   Patient alert and oriented and in no cardiopulmonary distress.  HEENT: No facial asymmetry, EOMI,   oropharynx pink and moist.  Neck decreased ROM no JVD, no mass.Bruit  Chest: Adequate air entry, few scattered wheezes, no crackles.  CVS: S1, S2 no murmurs, no S3.Regular rate.  ABD: Soft non tender.   Ext: No edema  MS: decreased  ROM spine, shoulders, hips and knees.  Skin: Abrasion to left elbow, not infected, full ROM left elbow, no point tenderness.  Psych: Good eye contact, normal affect.  not anxious or depressed appearing.  CNS: CN 2-12 intact, power,  normal throughout.no focal deficits  noted.   Assessment & Plan  At high risk for falls H/o recurrent falls and reduced mobility family , reports 5 falls in past 2 months, has benefited in the past from in home PT refer for same for 6 weeks, twice weekly   Abrasion of left elbow diect trauma to elbow with skin breakdown, X ray of left elbow to assess for bony injury, clinically not the case  Light headedness Light headedness with recurrent falls , refer for carotid US  Vertigo Possible vertigo contributing to falls, limited amount of antivert prescribed and family counseled re possible sedating s/e , therefore the need to avoid overuse and exercise caution  Need for immunization against influenza After obtaining informed consent, the vaccine is  administered , with no adverse effect noted at the time of administration.   Hyperlipidemia Hyperlipidemia:Low fat diet discussed and encouraged.   Lipid Panel  Lab Results  Component Value Date   CHOL 133 05/15/2019   HDL 64 05/15/2019   LDLCALC 49 05/15/2019   TRIG 110 05/15/2019   CHOLHDL 2.1 05/15/2019   Controlled, no change in medication     Hypothyroidism Controlled, no change in medication   Chronic anticoagulation At risk of needing to discontinuing anticoag meds due to recurrent fallls, will need to monitor closely. Cardiology aware also

## 2019-05-18 NOTE — Assessment & Plan Note (Signed)
diect trauma to elbow with skin breakdown, X ray of left elbow to assess for bony injury, clinically not the case

## 2019-05-18 NOTE — Assessment & Plan Note (Signed)
After obtaining informed consent, the vaccine is  administered , with no adverse effect noted at the time of administration.  

## 2019-05-20 ENCOUNTER — Telehealth: Payer: Self-pay

## 2019-05-20 DIAGNOSIS — I480 Paroxysmal atrial fibrillation: Secondary | ICD-10-CM

## 2019-05-20 DIAGNOSIS — E038 Other specified hypothyroidism: Secondary | ICD-10-CM

## 2019-05-20 DIAGNOSIS — I10 Essential (primary) hypertension: Secondary | ICD-10-CM

## 2019-05-20 NOTE — Telephone Encounter (Signed)
Labs ordered to be drawn 2nd week in October

## 2019-05-21 ENCOUNTER — Telehealth: Payer: Self-pay | Admitting: Family Medicine

## 2019-05-21 NOTE — Telephone Encounter (Signed)
[name] is calling back please call Cell [phone]

## 2019-05-21 NOTE — Telephone Encounter (Signed)
Returned Laurie's call and gave results of blood work

## 2019-05-24 ENCOUNTER — Other Ambulatory Visit: Payer: Self-pay | Admitting: Cardiology

## 2019-05-25 DIAGNOSIS — H3530 Unspecified macular degeneration: Secondary | ICD-10-CM | POA: Diagnosis not present

## 2019-05-25 DIAGNOSIS — I480 Paroxysmal atrial fibrillation: Secondary | ICD-10-CM | POA: Diagnosis not present

## 2019-05-25 DIAGNOSIS — E785 Hyperlipidemia, unspecified: Secondary | ICD-10-CM | POA: Diagnosis not present

## 2019-05-25 DIAGNOSIS — Z7901 Long term (current) use of anticoagulants: Secondary | ICD-10-CM | POA: Diagnosis not present

## 2019-05-25 DIAGNOSIS — K219 Gastro-esophageal reflux disease without esophagitis: Secondary | ICD-10-CM | POA: Diagnosis not present

## 2019-05-25 DIAGNOSIS — I10 Essential (primary) hypertension: Secondary | ICD-10-CM | POA: Diagnosis not present

## 2019-05-25 DIAGNOSIS — H9190 Unspecified hearing loss, unspecified ear: Secondary | ICD-10-CM | POA: Diagnosis not present

## 2019-05-25 DIAGNOSIS — R42 Dizziness and giddiness: Secondary | ICD-10-CM | POA: Diagnosis not present

## 2019-05-25 DIAGNOSIS — I495 Sick sinus syndrome: Secondary | ICD-10-CM | POA: Diagnosis not present

## 2019-05-25 DIAGNOSIS — E039 Hypothyroidism, unspecified: Secondary | ICD-10-CM | POA: Diagnosis not present

## 2019-05-25 DIAGNOSIS — M1990 Unspecified osteoarthritis, unspecified site: Secondary | ICD-10-CM | POA: Diagnosis not present

## 2019-05-25 DIAGNOSIS — Z950 Presence of cardiac pacemaker: Secondary | ICD-10-CM | POA: Diagnosis not present

## 2019-05-25 DIAGNOSIS — Z9181 History of falling: Secondary | ICD-10-CM | POA: Diagnosis not present

## 2019-05-26 ENCOUNTER — Telehealth: Payer: Self-pay | Admitting: *Deleted

## 2019-05-26 NOTE — Telephone Encounter (Signed)
Called to give verbal orders. Left vm requesting call back.

## 2019-05-26 NOTE — Telephone Encounter (Signed)
[name] PT with [facility] called requesting verbal orders for pt. PT orders will be three times a week for two weeks and two times a week for two weeks. She can be reached at [phone]

## 2019-05-27 DIAGNOSIS — Z9181 History of falling: Secondary | ICD-10-CM | POA: Diagnosis not present

## 2019-05-27 DIAGNOSIS — I10 Essential (primary) hypertension: Secondary | ICD-10-CM | POA: Diagnosis not present

## 2019-05-27 DIAGNOSIS — E039 Hypothyroidism, unspecified: Secondary | ICD-10-CM | POA: Diagnosis not present

## 2019-05-27 DIAGNOSIS — I495 Sick sinus syndrome: Secondary | ICD-10-CM | POA: Diagnosis not present

## 2019-05-27 DIAGNOSIS — I480 Paroxysmal atrial fibrillation: Secondary | ICD-10-CM | POA: Diagnosis not present

## 2019-05-27 DIAGNOSIS — H9190 Unspecified hearing loss, unspecified ear: Secondary | ICD-10-CM | POA: Diagnosis not present

## 2019-05-27 DIAGNOSIS — E785 Hyperlipidemia, unspecified: Secondary | ICD-10-CM | POA: Diagnosis not present

## 2019-05-27 DIAGNOSIS — K219 Gastro-esophageal reflux disease without esophagitis: Secondary | ICD-10-CM | POA: Diagnosis not present

## 2019-05-27 DIAGNOSIS — Z7901 Long term (current) use of anticoagulants: Secondary | ICD-10-CM | POA: Diagnosis not present

## 2019-05-27 DIAGNOSIS — Z950 Presence of cardiac pacemaker: Secondary | ICD-10-CM | POA: Diagnosis not present

## 2019-05-27 DIAGNOSIS — H3530 Unspecified macular degeneration: Secondary | ICD-10-CM | POA: Diagnosis not present

## 2019-05-27 DIAGNOSIS — M1990 Unspecified osteoarthritis, unspecified site: Secondary | ICD-10-CM | POA: Diagnosis not present

## 2019-05-27 DIAGNOSIS — R42 Dizziness and giddiness: Secondary | ICD-10-CM | POA: Diagnosis not present

## 2019-05-28 ENCOUNTER — Other Ambulatory Visit: Payer: Self-pay

## 2019-05-28 ENCOUNTER — Ambulatory Visit (HOSPITAL_COMMUNITY)
Admission: RE | Admit: 2019-05-28 | Discharge: 2019-05-28 | Disposition: A | Discharge status: Home / Self Care | Payer: Medicare Other | Source: Ambulatory Visit | Attending: Family Medicine | Admitting: Family Medicine

## 2019-05-28 DIAGNOSIS — R42 Dizziness and giddiness: Secondary | ICD-10-CM | POA: Insufficient documentation

## 2019-05-28 DIAGNOSIS — I6522 Occlusion and stenosis of left carotid artery: Secondary | ICD-10-CM | POA: Diagnosis not present

## 2019-05-29 NOTE — Telephone Encounter (Signed)
Left vm giving verbal orders for [name]

## 2019-05-30 DIAGNOSIS — I480 Paroxysmal atrial fibrillation: Secondary | ICD-10-CM | POA: Diagnosis not present

## 2019-05-30 DIAGNOSIS — Z950 Presence of cardiac pacemaker: Secondary | ICD-10-CM | POA: Diagnosis not present

## 2019-05-30 DIAGNOSIS — R42 Dizziness and giddiness: Secondary | ICD-10-CM | POA: Diagnosis not present

## 2019-05-30 DIAGNOSIS — H3530 Unspecified macular degeneration: Secondary | ICD-10-CM | POA: Diagnosis not present

## 2019-05-30 DIAGNOSIS — E039 Hypothyroidism, unspecified: Secondary | ICD-10-CM | POA: Diagnosis not present

## 2019-05-30 DIAGNOSIS — Z7901 Long term (current) use of anticoagulants: Secondary | ICD-10-CM | POA: Diagnosis not present

## 2019-05-30 DIAGNOSIS — I495 Sick sinus syndrome: Secondary | ICD-10-CM | POA: Diagnosis not present

## 2019-05-30 DIAGNOSIS — I10 Essential (primary) hypertension: Secondary | ICD-10-CM | POA: Diagnosis not present

## 2019-05-30 DIAGNOSIS — H9190 Unspecified hearing loss, unspecified ear: Secondary | ICD-10-CM | POA: Diagnosis not present

## 2019-05-30 DIAGNOSIS — K219 Gastro-esophageal reflux disease without esophagitis: Secondary | ICD-10-CM | POA: Diagnosis not present

## 2019-05-30 DIAGNOSIS — E785 Hyperlipidemia, unspecified: Secondary | ICD-10-CM | POA: Diagnosis not present

## 2019-05-30 DIAGNOSIS — M1990 Unspecified osteoarthritis, unspecified site: Secondary | ICD-10-CM | POA: Diagnosis not present

## 2019-05-30 DIAGNOSIS — Z9181 History of falling: Secondary | ICD-10-CM | POA: Diagnosis not present

## 2019-06-03 DIAGNOSIS — R0902 Hypoxemia: Secondary | ICD-10-CM | POA: Diagnosis not present

## 2019-06-04 ENCOUNTER — Ambulatory Visit (INDEPENDENT_AMBULATORY_CARE_PROVIDER_SITE_OTHER): Payer: Medicare Other | Admitting: *Deleted

## 2019-06-04 ENCOUNTER — Other Ambulatory Visit: Payer: Self-pay

## 2019-06-04 DIAGNOSIS — H9190 Unspecified hearing loss, unspecified ear: Secondary | ICD-10-CM | POA: Diagnosis not present

## 2019-06-04 DIAGNOSIS — I10 Essential (primary) hypertension: Secondary | ICD-10-CM | POA: Diagnosis not present

## 2019-06-04 DIAGNOSIS — I495 Sick sinus syndrome: Secondary | ICD-10-CM | POA: Diagnosis not present

## 2019-06-04 DIAGNOSIS — Z7901 Long term (current) use of anticoagulants: Secondary | ICD-10-CM | POA: Diagnosis not present

## 2019-06-04 DIAGNOSIS — H3530 Unspecified macular degeneration: Secondary | ICD-10-CM | POA: Diagnosis not present

## 2019-06-04 DIAGNOSIS — R42 Dizziness and giddiness: Secondary | ICD-10-CM | POA: Diagnosis not present

## 2019-06-04 DIAGNOSIS — I480 Paroxysmal atrial fibrillation: Secondary | ICD-10-CM | POA: Diagnosis not present

## 2019-06-04 DIAGNOSIS — M1990 Unspecified osteoarthritis, unspecified site: Secondary | ICD-10-CM | POA: Diagnosis not present

## 2019-06-04 DIAGNOSIS — Z9181 History of falling: Secondary | ICD-10-CM | POA: Diagnosis not present

## 2019-06-04 DIAGNOSIS — Z5181 Encounter for therapeutic drug level monitoring: Secondary | ICD-10-CM | POA: Diagnosis not present

## 2019-06-04 DIAGNOSIS — Z950 Presence of cardiac pacemaker: Secondary | ICD-10-CM | POA: Diagnosis not present

## 2019-06-04 DIAGNOSIS — E039 Hypothyroidism, unspecified: Secondary | ICD-10-CM | POA: Diagnosis not present

## 2019-06-04 DIAGNOSIS — K219 Gastro-esophageal reflux disease without esophagitis: Secondary | ICD-10-CM | POA: Diagnosis not present

## 2019-06-04 DIAGNOSIS — E785 Hyperlipidemia, unspecified: Secondary | ICD-10-CM | POA: Diagnosis not present

## 2019-06-04 LAB — POCT INR: INR: 2.1 (ref 2.0–3.0)

## 2019-06-04 NOTE — Patient Instructions (Signed)
Continue warfarin 1/2 tablet daily except 1 tablet on Wednesdays and Saturdays.  Recheck in 6 weeks Keep intake of greens consistent

## 2019-06-05 DIAGNOSIS — M1990 Unspecified osteoarthritis, unspecified site: Secondary | ICD-10-CM | POA: Diagnosis not present

## 2019-06-05 DIAGNOSIS — R42 Dizziness and giddiness: Secondary | ICD-10-CM | POA: Diagnosis not present

## 2019-06-05 DIAGNOSIS — Z950 Presence of cardiac pacemaker: Secondary | ICD-10-CM | POA: Diagnosis not present

## 2019-06-05 DIAGNOSIS — I495 Sick sinus syndrome: Secondary | ICD-10-CM | POA: Diagnosis not present

## 2019-06-05 DIAGNOSIS — E039 Hypothyroidism, unspecified: Secondary | ICD-10-CM | POA: Diagnosis not present

## 2019-06-05 DIAGNOSIS — Z9181 History of falling: Secondary | ICD-10-CM | POA: Diagnosis not present

## 2019-06-05 DIAGNOSIS — I10 Essential (primary) hypertension: Secondary | ICD-10-CM | POA: Diagnosis not present

## 2019-06-05 DIAGNOSIS — H3530 Unspecified macular degeneration: Secondary | ICD-10-CM | POA: Diagnosis not present

## 2019-06-05 DIAGNOSIS — K219 Gastro-esophageal reflux disease without esophagitis: Secondary | ICD-10-CM | POA: Diagnosis not present

## 2019-06-05 DIAGNOSIS — H9190 Unspecified hearing loss, unspecified ear: Secondary | ICD-10-CM | POA: Diagnosis not present

## 2019-06-05 DIAGNOSIS — E785 Hyperlipidemia, unspecified: Secondary | ICD-10-CM | POA: Diagnosis not present

## 2019-06-05 DIAGNOSIS — Z7901 Long term (current) use of anticoagulants: Secondary | ICD-10-CM | POA: Diagnosis not present

## 2019-06-05 DIAGNOSIS — I480 Paroxysmal atrial fibrillation: Secondary | ICD-10-CM | POA: Diagnosis not present

## 2019-06-06 ENCOUNTER — Other Ambulatory Visit: Payer: Self-pay | Admitting: Family Medicine

## 2019-06-06 DIAGNOSIS — H3530 Unspecified macular degeneration: Secondary | ICD-10-CM | POA: Diagnosis not present

## 2019-06-06 DIAGNOSIS — Z9181 History of falling: Secondary | ICD-10-CM | POA: Diagnosis not present

## 2019-06-06 DIAGNOSIS — H9190 Unspecified hearing loss, unspecified ear: Secondary | ICD-10-CM | POA: Diagnosis not present

## 2019-06-06 DIAGNOSIS — E785 Hyperlipidemia, unspecified: Secondary | ICD-10-CM | POA: Diagnosis not present

## 2019-06-06 DIAGNOSIS — K219 Gastro-esophageal reflux disease without esophagitis: Secondary | ICD-10-CM | POA: Diagnosis not present

## 2019-06-06 DIAGNOSIS — R42 Dizziness and giddiness: Secondary | ICD-10-CM | POA: Diagnosis not present

## 2019-06-06 DIAGNOSIS — Z950 Presence of cardiac pacemaker: Secondary | ICD-10-CM | POA: Diagnosis not present

## 2019-06-06 DIAGNOSIS — Z7901 Long term (current) use of anticoagulants: Secondary | ICD-10-CM | POA: Diagnosis not present

## 2019-06-06 DIAGNOSIS — I480 Paroxysmal atrial fibrillation: Secondary | ICD-10-CM | POA: Diagnosis not present

## 2019-06-06 DIAGNOSIS — M1990 Unspecified osteoarthritis, unspecified site: Secondary | ICD-10-CM | POA: Diagnosis not present

## 2019-06-06 DIAGNOSIS — E039 Hypothyroidism, unspecified: Secondary | ICD-10-CM | POA: Diagnosis not present

## 2019-06-06 DIAGNOSIS — I10 Essential (primary) hypertension: Secondary | ICD-10-CM | POA: Diagnosis not present

## 2019-06-06 DIAGNOSIS — I495 Sick sinus syndrome: Secondary | ICD-10-CM | POA: Diagnosis not present

## 2019-06-10 ENCOUNTER — Emergency Department (HOSPITAL_COMMUNITY)
Admission: EM | Admit: 2019-06-10 | Discharge: 2019-06-10 | Disposition: A | Discharge status: Home / Self Care | Payer: Medicare Other | Attending: Emergency Medicine | Admitting: Emergency Medicine

## 2019-06-10 ENCOUNTER — Ambulatory Visit: Payer: Medicare Other | Admitting: Family Medicine

## 2019-06-10 ENCOUNTER — Other Ambulatory Visit: Payer: Self-pay

## 2019-06-10 ENCOUNTER — Encounter (HOSPITAL_COMMUNITY): Payer: Self-pay | Admitting: Emergency Medicine

## 2019-06-10 ENCOUNTER — Emergency Department (HOSPITAL_COMMUNITY): Payer: Medicare Other

## 2019-06-10 DIAGNOSIS — S01112A Laceration without foreign body of left eyelid and periocular area, initial encounter: Secondary | ICD-10-CM | POA: Diagnosis not present

## 2019-06-10 DIAGNOSIS — E039 Hypothyroidism, unspecified: Secondary | ICD-10-CM | POA: Diagnosis not present

## 2019-06-10 DIAGNOSIS — Y929 Unspecified place or not applicable: Secondary | ICD-10-CM | POA: Diagnosis not present

## 2019-06-10 DIAGNOSIS — R519 Headache, unspecified: Secondary | ICD-10-CM | POA: Insufficient documentation

## 2019-06-10 DIAGNOSIS — Y939 Activity, unspecified: Secondary | ICD-10-CM | POA: Insufficient documentation

## 2019-06-10 DIAGNOSIS — Z79899 Other long term (current) drug therapy: Secondary | ICD-10-CM | POA: Insufficient documentation

## 2019-06-10 DIAGNOSIS — H3530 Unspecified macular degeneration: Secondary | ICD-10-CM | POA: Diagnosis not present

## 2019-06-10 DIAGNOSIS — S0990XA Unspecified injury of head, initial encounter: Secondary | ICD-10-CM

## 2019-06-10 DIAGNOSIS — W010XXA Fall on same level from slipping, tripping and stumbling without subsequent striking against object, initial encounter: Secondary | ICD-10-CM | POA: Insufficient documentation

## 2019-06-10 DIAGNOSIS — K219 Gastro-esophageal reflux disease without esophagitis: Secondary | ICD-10-CM | POA: Diagnosis not present

## 2019-06-10 DIAGNOSIS — Y999 Unspecified external cause status: Secondary | ICD-10-CM | POA: Insufficient documentation

## 2019-06-10 DIAGNOSIS — H9190 Unspecified hearing loss, unspecified ear: Secondary | ICD-10-CM | POA: Diagnosis not present

## 2019-06-10 DIAGNOSIS — W19XXXA Unspecified fall, initial encounter: Secondary | ICD-10-CM

## 2019-06-10 DIAGNOSIS — Z9181 History of falling: Secondary | ICD-10-CM | POA: Diagnosis not present

## 2019-06-10 DIAGNOSIS — R42 Dizziness and giddiness: Secondary | ICD-10-CM | POA: Diagnosis not present

## 2019-06-10 DIAGNOSIS — M1990 Unspecified osteoarthritis, unspecified site: Secondary | ICD-10-CM | POA: Diagnosis not present

## 2019-06-10 DIAGNOSIS — I495 Sick sinus syndrome: Secondary | ICD-10-CM | POA: Diagnosis not present

## 2019-06-10 DIAGNOSIS — I10 Essential (primary) hypertension: Secondary | ICD-10-CM | POA: Diagnosis not present

## 2019-06-10 DIAGNOSIS — Z950 Presence of cardiac pacemaker: Secondary | ICD-10-CM | POA: Diagnosis not present

## 2019-06-10 DIAGNOSIS — I480 Paroxysmal atrial fibrillation: Secondary | ICD-10-CM | POA: Diagnosis not present

## 2019-06-10 DIAGNOSIS — Z7901 Long term (current) use of anticoagulants: Secondary | ICD-10-CM | POA: Diagnosis not present

## 2019-06-10 DIAGNOSIS — E785 Hyperlipidemia, unspecified: Secondary | ICD-10-CM | POA: Diagnosis not present

## 2019-06-10 NOTE — Discharge Instructions (Addendum)
CT scan of your head showed no acute findings.  Try to keep the bandage dry for 2 days.  Steri-Strips will ultimately drop off.

## 2019-06-10 NOTE — ED Provider Notes (Signed)
[facility] EMERGENCY DEPARTMENT Provider Note   CSN: [id number] Arrival date & time: 06/10/19  1507     History   Chief Complaint Chief Complaint  Patient presents with  . Fall    HPI Hayley BENOIT is a 85 y.o. female.     Accidental trip and fall striking the left eyebrow just prior to visit.  No loss of consciousness or neurological deficits.  Patient is taking Coumadin.  Severity of injury is mild.     Past Medical History:  Diagnosis Date  . Arthritis   . Gastroesophageal reflux disease   . Hearing impairment   . Hyperlipidemia   . Hypertension   . Hypothyroidism   . Impaired glucose tolerance   . Macular degeneration   . Pancreatitis   . Paroxysmal atrial fibrillation ([place]) 2011  . Sick sinus syndrome ([place]) 2011   Atrial fibrilation 10/2009; and dual chamber pacemaker in 4/11; normal EF  . Sleep apnea    Nocturnal oxygen therapy  . Zoster 2016    Patient Active Problem List   Diagnosis Date Noted  . Abrasion of left elbow 05/18/2019  . Light headedness 05/18/2019  . Vertigo 05/18/2019  . Cough 12/23/2016  . Nocturnal hypoxia 12/03/2016  . Osteopenia 10/18/2016  . At high risk for falls 10/21/2015  . Need for immunization against influenza 09/01/2014  . Seasonal allergies 05/05/2014  . Sick sinus syndrome ([place])   . Chronic anticoagulation 11/18/2010  . Impaired fasting glucose 10/14/2010  . Paroxysmal atrial fibrillation ([place]) 06/14/2010  . 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 Hypothyroidism 02/25/2008  . Hyperlipidemia 02/25/2008  . HEARING LOSS 02/25/2008  . Essential hypertension 02/25/2008    Past Surgical History:  Procedure Laterality Date  . ABDOMINAL HYSTERECTOMY    . CATARACT EXTRACTION     Bilateral  . COLONOSCOPY  2009   Dr. Gala Romney  . EYE SURGERY     laser   . PACEMAKER INSERTION  2011   dual chamber   . PPM GENERATOR CHANGEOUT N/A 11/13/2016   Procedure: PPM Generator Changeout;  Surgeon: Evans Lance, MD;  Location: [facility];  Service: Cardiovascular;  Laterality: N/A;  . SALPINGOOPHORECTOMY  1970   right     OB History   No obstetric history on file.      Home Medications    Prior to Admission medications   Medication Sig Start Date End Date Taking? Authorizing Provider  Calcium Carb-Cholecalciferol 500-400 MG-UNIT CHEW One tablet twice daily 10/07/17   Fayrene Helper, MD  hydrochlorothiazide (HYDRODIURIL) 25 MG tablet TAKE 1 TABLET BY MOUTH ONCE DAILY. 06/19/18   Arnoldo Lenis, MD  levothyroxine (SYNTHROID) 112 MCG tablet TAKE 1 TABLET BY MOUTH ONCE DAILY. 04/02/19   Fayrene Helper, MD  lovastatin (MEVACOR) 40 MG tablet TAKE ONE TABLET BY MOUTH DAILY. 12/23/18   Arnoldo Lenis, MD  meclizine (ANTIVERT) 12.5 MG tablet Take 1 tablet (12.5 mg total) by mouth 2 (two) times daily as needed for dizziness. 05/15/19   Fayrene Helper, MD  metoprolol tartrate (LOPRESSOR) 100 MG tablet TAKE 1 TABLET BY MOUTH TWICE A DAY. 09/09/18   Arnoldo Lenis, MD  omeprazole (PRILOSEC) 40 MG capsule TAKE (1) CAPSULE BY MOUTH ONCE DAILY FOR ACID REFLUX. 06/09/19   Fayrene Helper, MD  OXYGEN-HELIUM IN Inhale 2 L into the lungs at bedtime.    [provider]  Potassium 99 MG TABS Take 1 tablet by mouth daily.    [provider]  warfarin (COUMADIN) 5 MG tablet TAKE 1/2 TABLET BY MOUTH DAILY EXCEPT 1 TABLET ON WEDNESDAYS & SATURDAYS OR AS DIRECTED. 05/26/19   Arnoldo Lenis, MD  benzonatate (TESSALON PERLES) 100 MG capsule Take 1 capsule (100 mg total) by mouth every 6 (six) hours as needed for cough. 02/05/13 11/04/18  Fayrene Helper, MD    Family History Family History  Problem Relation Age of Onset  . Cancer Mother        gential  . Cancer Father        throat   . Pneumonia Sister   . Emphysema Sister   . Mitral valve prolapse Sister   . Heart disease Other   . Arthritis Other   . Lung disease Other   . Asthma Other   . Melanoma Son     Social History Social  History   Tobacco Use  . Smoking status: Never Smoker  . Smokeless tobacco: Never Used  Substance Use Topics  . Alcohol use: No    Alcohol/week: 0.0 standard drinks  . Drug use: No     Allergies   Patient has no known allergies.   Review of Systems Review of Systems  All other systems reviewed and are negative.    Physical Exam Updated Vital Signs BP (!) 149/89 (BP Location: Right Arm)   Pulse (!) 123   Temp 98.5 F (36.9 C) (Oral)   Resp 18   Ht 5\' 5"  (1.651 m)   Wt 68 kg   SpO2 100%   BMI 24.96 kg/m   Physical Exam Vitals signs and nursing note reviewed.  Constitutional:      Appearance: She is well-developed.  HENT:     Head: Normocephalic and atraumatic.  Eyes:     Conjunctiva/sclera: Conjunctivae normal.  Neck:     Musculoskeletal: Neck supple.  Cardiovascular:     Rate and Rhythm: Normal rate and regular rhythm.  Pulmonary:     Effort: Pulmonary effort is normal.     Breath sounds: Normal breath sounds.  Abdominal:     General: Bowel sounds are normal.     Palpations: Abdomen is soft.  Musculoskeletal: Normal range of motion.  Skin:    General: Skin is warm and dry.     Comments: Face: 2.0 cm oblique laceration on left lateral eyebrow  Neurological:     General: No focal deficit present.     Mental Status: She is alert and oriented to person, place, and time.  Psychiatric:        Behavior: Behavior normal.      ED Treatments / Results  Labs (all labs ordered are listed, but only abnormal results are displayed) Labs Reviewed - No data to display  EKG None  Radiology Ct Head Wo Contrast  Result Date: 06/10/2019 CLINICAL DATA:  Pain following fall EXAM: CT HEAD WITHOUT CONTRAST TECHNIQUE: Contiguous axial images were obtained from the base of the skull through the vertex without intravenous contrast. COMPARISON:  March 09, 2017 FINDINGS: Brain: There is age related volume loss, stable. There is no intracranial mass, hemorrhage,  extra-axial fluid collection, or midline shift. Patchy small vessel disease in the centra semiovale bilaterally is stable. No acute infarct is demonstrable on this study. Vascular: There is no hyperdense vessel. There is calcification in each carotid siphon region. Skull: Bony calvarium appears intact. Sinuses/Orbits: There is mucosal thickening, mild, in each visualized maxillary antrum. There is mucosal thickening and opacification in multiple ethmoid air cells. There is  mild mucosal thickening in the anterior right sphenoid sinus. Orbits appear symmetric bilaterally. Other: Mastoid air cells are clear, although somewhat hypoplastic on the left, stable. IMPRESSION: Age related volume loss with periventricular small vessel disease. No acute infarct. No mass or hemorrhage. There are foci of arterial vascular calcification. There are foci of paranasal sinus disease. 5 Electronically Signed   By: Lowella Grip III M.D.   On: 06/10/2019 17:29    Procedures .Marland KitchenLaceration Repair  Date/Time: 06/10/2019 8:24 PM Performed by: Nat Christen, MD Authorized by: Nat Christen, MD   Consent:    Consent obtained:  Verbal   Consent given by:  Patient Anesthesia (see MAR for exact dosages):    Anesthesia method:  None Laceration details:    Location:  Face   Face location:  L eyebrow   Wound length (cm): 2.0. Repair type:    Repair type:  Simple Exploration:    Contaminated: no   Treatment:    Amount of cleaning:  Standard   Visualized foreign bodies/material removed: no   Skin repair:    Repair method:  Steri-Strips Approximation:    Approximation:  Loose Post-procedure details:    Dressing:  Sterile dressing   Patient tolerance of procedure:  Tolerated well, no immediate complications   (including critical care time)  Medications Ordered in ED Medications - No data to display   Initial Impression / Assessment and Plan / ED Course  I have reviewed the triage vital signs and the nursing  notes.  Pertinent labs & imaging results that were available during my care of the patient were reviewed by me and considered in my medical decision making (see chart for details).        Patient is alert and oriented without neurological deficits.  CT head shows no acute findings.  Steri-Strips applied to laceration.  Final Clinical Impressions(s) / ED Diagnoses   Final diagnoses:  Fall, initial encounter  Minor head injury, initial encounter  Laceration of left eyebrow, initial encounter    ED Discharge Orders    None       Nat Christen, MD 06/10/19 2025

## 2019-06-10 NOTE — ED Notes (Signed)
Steri-strips applied to eyebrow laceration per Dr. Lacinda Axon.

## 2019-06-10 NOTE — ED Triage Notes (Signed)
Patient complains of head pain and laceration to head after falling today. Patient states she tripped at front door. Patient accompanied with daughter.

## 2019-06-12 ENCOUNTER — Telehealth: Payer: Self-pay

## 2019-06-12 NOTE — Telephone Encounter (Signed)
Late entry. Patients daughter called on 06/10/19 wanting to bring her mother in after a fall. She had a gash on her forehead and was acting tired. Advised her to take her to the ER. Patient declined so I spoke with her one the phone and she then agreed and daughter took her to ER to be evaluated

## 2019-06-17 ENCOUNTER — Ambulatory Visit (INDEPENDENT_AMBULATORY_CARE_PROVIDER_SITE_OTHER): Payer: Medicare Other | Admitting: Family Medicine

## 2019-06-17 ENCOUNTER — Other Ambulatory Visit: Payer: Self-pay

## 2019-06-17 VITALS — BP 122/70 | Ht 66.0 in | Wt 157.0 lb

## 2019-06-17 DIAGNOSIS — I10 Essential (primary) hypertension: Secondary | ICD-10-CM | POA: Diagnosis not present

## 2019-06-17 DIAGNOSIS — J322 Chronic ethmoidal sinusitis: Secondary | ICD-10-CM

## 2019-06-17 DIAGNOSIS — Z9181 History of falling: Secondary | ICD-10-CM | POA: Diagnosis not present

## 2019-06-17 DIAGNOSIS — Z09 Encounter for follow-up examination after completed treatment for conditions other than malignant neoplasm: Secondary | ICD-10-CM

## 2019-06-17 DIAGNOSIS — I480 Paroxysmal atrial fibrillation: Secondary | ICD-10-CM

## 2019-06-17 MED ORDER — PENICILLIN V POTASSIUM 500 MG PO TABS: 500.0000 mg | ORAL_TABLET | Freq: Three times a day (TID) | ORAL | 0 refills | Status: DC

## 2019-06-17 NOTE — Patient Instructions (Signed)
F/u as before, call if you need me sooner  You are treated for sinusitis with a 10 day course of penicillin , if your symptoms persist or worsen you will need to call back and I would recommend Covid screening at that time  Your recent head scan in the ED reported  sinus infection

## 2019-06-17 NOTE — Assessment & Plan Note (Signed)
10 day course of penicillin, preascribed, if symptoms persist or worsen will recommend covid screen

## 2019-06-17 NOTE — Progress Notes (Signed)
Virtual Visit via Telephone Note  I connected with [name] on [id number] at  3:40 PM EDT by telephone and verified that I am speaking with the correct person using two identifiers.  Location: Patient: home Provider: office   I discussed the limitations, risks, security and privacy concerns of performing an evaluation and management service by telephone and the availability of in person appointments. I also discussed with the patient that there may be a patient responsible charge related to this service. The patient expressed understanding and agreed to proceed.   History of Present Illness: 3 day h/o intermittent chills and fever with cough and increased nasal drainage. Was evaluated in the eD recently for head trauma and scan reports sinus infection, so she will be treated for thi and will call back if no improvement or worsening symptoms . Denies chest pains, palpitations and leg swelling Denies abdominal pain, nausea, vomiting,diarrhea or constipation.   Denies dysuria, frequency, hesitancy or incontinence. C/o chronic  joint pain, swelling and limitation in mobility. Denies headaches, seizures, numbness, or tingling. Denies depression, anxiety or insomnia. Denies skin break down or rash.       Observations/Objective: BP 122/70   Ht 5\' 6"  (1.676 m)   Wt 157 lb (71.2 kg)   BMI 25.34 kg/m  Good communication with no confusion and intact memory. Alert and oriented x 3 No signs of respiratory distress during speech    Assessment and Plan: Ethmoid sinusitis 10 day course of penicillin, preascribed, if symptoms persist or worsen will recommend covid screen  Encounter for examination following treatment at hospital Ed record reviewed with daughter and patient. Head scan positive for sinus infection, an negative for bleed or fracture following head trauma. No new loss or confusion. Will be treated for infection based on current complaint wih penicillin   Essential  hypertension Controlled, no change in medication   Paroxysmal atrial fibrillation (HCC) Rate controlled has pacemeker  At high risk for falls Home safet and fall risk reduction discussed    Follow Up Instructions:    I discussed the assessment and treatment plan with the patient. The patient was provided an opportunity to ask questions and all were answered. The patient agreed with the plan and demonstrated an understanding of the instructions.   The patient was advised to call back or seek an in-person evaluation if the symptoms worsen or if the condition fails to improve as anticipated.  I provided 22 minutes of non-face-to-face time during this encounter.   Tula Nakayama, MD

## 2019-06-20 ENCOUNTER — Encounter: Payer: Self-pay | Admitting: Family Medicine

## 2019-06-20 NOTE — Assessment & Plan Note (Signed)
Ed record reviewed with daughter and patient. Head scan positive for sinus infection, an negative for bleed or fracture following head trauma. No new loss or confusion. Will be treated for infection based on current complaint wih penicillin

## 2019-06-20 NOTE — Assessment & Plan Note (Signed)
Home safet and fall risk reduction discussed

## 2019-06-20 NOTE — Assessment & Plan Note (Signed)
Rate controlled has pacemeker

## 2019-06-20 NOTE — Assessment & Plan Note (Signed)
Controlled, no change in medication  

## 2019-06-24 DIAGNOSIS — M1990 Unspecified osteoarthritis, unspecified site: Secondary | ICD-10-CM | POA: Diagnosis not present

## 2019-06-24 DIAGNOSIS — R42 Dizziness and giddiness: Secondary | ICD-10-CM | POA: Diagnosis not present

## 2019-06-24 DIAGNOSIS — I10 Essential (primary) hypertension: Secondary | ICD-10-CM | POA: Diagnosis not present

## 2019-06-24 DIAGNOSIS — E785 Hyperlipidemia, unspecified: Secondary | ICD-10-CM | POA: Diagnosis not present

## 2019-06-24 DIAGNOSIS — Z950 Presence of cardiac pacemaker: Secondary | ICD-10-CM | POA: Diagnosis not present

## 2019-06-24 DIAGNOSIS — H3530 Unspecified macular degeneration: Secondary | ICD-10-CM | POA: Diagnosis not present

## 2019-06-24 DIAGNOSIS — E039 Hypothyroidism, unspecified: Secondary | ICD-10-CM | POA: Diagnosis not present

## 2019-06-24 DIAGNOSIS — K219 Gastro-esophageal reflux disease without esophagitis: Secondary | ICD-10-CM | POA: Diagnosis not present

## 2019-06-24 DIAGNOSIS — H9190 Unspecified hearing loss, unspecified ear: Secondary | ICD-10-CM | POA: Diagnosis not present

## 2019-06-24 DIAGNOSIS — Z9181 History of falling: Secondary | ICD-10-CM | POA: Diagnosis not present

## 2019-06-24 DIAGNOSIS — Z7901 Long term (current) use of anticoagulants: Secondary | ICD-10-CM | POA: Diagnosis not present

## 2019-06-24 DIAGNOSIS — I495 Sick sinus syndrome: Secondary | ICD-10-CM | POA: Diagnosis not present

## 2019-06-24 DIAGNOSIS — I480 Paroxysmal atrial fibrillation: Secondary | ICD-10-CM | POA: Diagnosis not present

## 2019-06-26 ENCOUNTER — Ambulatory Visit: Payer: Medicare Other

## 2019-06-27 ENCOUNTER — Telehealth: Payer: Self-pay | Admitting: *Deleted

## 2019-06-27 ENCOUNTER — Other Ambulatory Visit: Payer: Self-pay | Admitting: Cardiology

## 2019-06-27 ENCOUNTER — Other Ambulatory Visit: Payer: Self-pay | Admitting: Family Medicine

## 2019-06-27 DIAGNOSIS — R05 Cough: Secondary | ICD-10-CM

## 2019-06-27 DIAGNOSIS — Z950 Presence of cardiac pacemaker: Secondary | ICD-10-CM | POA: Diagnosis not present

## 2019-06-27 DIAGNOSIS — E785 Hyperlipidemia, unspecified: Secondary | ICD-10-CM | POA: Diagnosis not present

## 2019-06-27 DIAGNOSIS — R059 Cough, unspecified: Secondary | ICD-10-CM

## 2019-06-27 DIAGNOSIS — R42 Dizziness and giddiness: Secondary | ICD-10-CM | POA: Diagnosis not present

## 2019-06-27 DIAGNOSIS — H3530 Unspecified macular degeneration: Secondary | ICD-10-CM | POA: Diagnosis not present

## 2019-06-27 DIAGNOSIS — Z7901 Long term (current) use of anticoagulants: Secondary | ICD-10-CM | POA: Diagnosis not present

## 2019-06-27 DIAGNOSIS — K219 Gastro-esophageal reflux disease without esophagitis: Secondary | ICD-10-CM | POA: Diagnosis not present

## 2019-06-27 DIAGNOSIS — Z9181 History of falling: Secondary | ICD-10-CM | POA: Diagnosis not present

## 2019-06-27 DIAGNOSIS — I10 Essential (primary) hypertension: Secondary | ICD-10-CM | POA: Diagnosis not present

## 2019-06-27 DIAGNOSIS — J329 Chronic sinusitis, unspecified: Secondary | ICD-10-CM

## 2019-06-27 DIAGNOSIS — I495 Sick sinus syndrome: Secondary | ICD-10-CM | POA: Diagnosis not present

## 2019-06-27 DIAGNOSIS — M1990 Unspecified osteoarthritis, unspecified site: Secondary | ICD-10-CM | POA: Diagnosis not present

## 2019-06-27 DIAGNOSIS — H9190 Unspecified hearing loss, unspecified ear: Secondary | ICD-10-CM | POA: Diagnosis not present

## 2019-06-27 DIAGNOSIS — E039 Hypothyroidism, unspecified: Secondary | ICD-10-CM | POA: Diagnosis not present

## 2019-06-27 DIAGNOSIS — I480 Paroxysmal atrial fibrillation: Secondary | ICD-10-CM | POA: Diagnosis not present

## 2019-06-27 MED ORDER — AZITHROMYCIN 250 MG PO TABS: ORAL_TABLET | ORAL | 0 refills | Status: DC

## 2019-06-27 NOTE — Telephone Encounter (Signed)
Spoke with patients daughter. They checked temperature while we were on the phone. No fever. They will take patient to the lab either today or Monday for testing.

## 2019-06-27 NOTE — Telephone Encounter (Signed)
pts daughter Cecille Rubin called said Dr Moshe Cipro called in penicllin for pt last week but her mother is feeling no better and having a lot of drainaiage would like something stronger than penicillin called in

## 2019-06-27 NOTE — Progress Notes (Signed)
azithro

## 2019-06-27 NOTE — Telephone Encounter (Signed)
I recommend that sample be sent of the post nasal/ sinus drainage and sputum ( if she has this) for culture and sensitivity testing , pls send an order to lab of pt choice let daughter know she cam either collect the sputum cup here with order or get it from the lab I recommend saline/ netty pot flushes for drainage Pls ask specifically if she has a fever and document . I am prescribing Z pack Azithromycin since she does have a sinus  Infection form recent brain scan

## 2019-06-30 ENCOUNTER — Other Ambulatory Visit: Payer: Self-pay

## 2019-06-30 ENCOUNTER — Encounter: Payer: Medicare Other | Admitting: Family Medicine

## 2019-07-01 DIAGNOSIS — Z950 Presence of cardiac pacemaker: Secondary | ICD-10-CM | POA: Diagnosis not present

## 2019-07-01 DIAGNOSIS — Z7901 Long term (current) use of anticoagulants: Secondary | ICD-10-CM | POA: Diagnosis not present

## 2019-07-01 DIAGNOSIS — I495 Sick sinus syndrome: Secondary | ICD-10-CM | POA: Diagnosis not present

## 2019-07-01 DIAGNOSIS — H3530 Unspecified macular degeneration: Secondary | ICD-10-CM | POA: Diagnosis not present

## 2019-07-01 DIAGNOSIS — Z9181 History of falling: Secondary | ICD-10-CM | POA: Diagnosis not present

## 2019-07-01 DIAGNOSIS — R42 Dizziness and giddiness: Secondary | ICD-10-CM | POA: Diagnosis not present

## 2019-07-01 DIAGNOSIS — M1990 Unspecified osteoarthritis, unspecified site: Secondary | ICD-10-CM | POA: Diagnosis not present

## 2019-07-01 DIAGNOSIS — E785 Hyperlipidemia, unspecified: Secondary | ICD-10-CM | POA: Diagnosis not present

## 2019-07-01 DIAGNOSIS — K219 Gastro-esophageal reflux disease without esophagitis: Secondary | ICD-10-CM | POA: Diagnosis not present

## 2019-07-01 DIAGNOSIS — H9190 Unspecified hearing loss, unspecified ear: Secondary | ICD-10-CM | POA: Diagnosis not present

## 2019-07-01 DIAGNOSIS — E039 Hypothyroidism, unspecified: Secondary | ICD-10-CM | POA: Diagnosis not present

## 2019-07-01 DIAGNOSIS — I480 Paroxysmal atrial fibrillation: Secondary | ICD-10-CM | POA: Diagnosis not present

## 2019-07-01 DIAGNOSIS — I10 Essential (primary) hypertension: Secondary | ICD-10-CM | POA: Diagnosis not present

## 2019-07-02 ENCOUNTER — Encounter: Payer: Self-pay | Admitting: Family Medicine

## 2019-07-02 ENCOUNTER — Ambulatory Visit (INDEPENDENT_AMBULATORY_CARE_PROVIDER_SITE_OTHER): Payer: Medicare Other | Admitting: Family Medicine

## 2019-07-02 ENCOUNTER — Other Ambulatory Visit: Payer: Self-pay

## 2019-07-02 VITALS — BP 122/70 | Ht 66.0 in | Wt 157.0 lb

## 2019-07-02 DIAGNOSIS — Z0000 Encounter for general adult medical examination without abnormal findings: Secondary | ICD-10-CM | POA: Diagnosis not present

## 2019-07-02 NOTE — [facility] Notes (Signed)
Subjective:   [name] is a 85 y.o. female who presents for Medicare Annual (Subsequent) preventive examination.  Location of Patient: Home Location of Provider: Telehealth Consent was obtain for visit to be over via telehealth.  I verified that I am speaking with the correct person using two identifiers.  Review of Systems:   Cardiac Risk Factors include: advanced age (>64men, >80 women);dyslipidemia;hypertension;sedentary lifestyle     Objective:     Vitals: BP 122/70   Ht 5\' 6"  (1.676 m)   Wt 157 lb (71.2 kg)   BMI 25.34 kg/m   Body mass index is 25.34 kg/m.  Advanced Directives 11/13/2016 10/18/2016 02/09/2015  Does Patient Have a Medical Advance Directive? No No Yes  Type of Advance Directive - - Living will  Would patient like information on creating a medical advance directive? No - Patient declined (No Data) -    Tobacco Social History   Tobacco Use  Smoking Status Never Smoker  Smokeless Tobacco Never Used     Counseling given: Not Answered   Clinical Intake:     Pain : No/denies pain Pain Score: 0-No pain     Diabetes: No  How often do you need to have someone help you when you read instructions, pamphlets, or other written materials from your doctor or pharmacy?: 3 - Sometimes(due to vision impairements) What is the last grade level you completed in school?: diploma- some college  Interpreter Needed?: No  Information entered by :: Kate Sable LPN  Past Medical History:  Diagnosis Date  . Arthritis   . Gastroesophageal reflux disease   . Hearing impairment   . Hyperlipidemia   . Hypertension   . Hypothyroidism   . Impaired glucose tolerance   . Macular degeneration   . Pancreatitis   . Paroxysmal atrial fibrillation ([place]) 2011  . Sick sinus syndrome ([place]) 2011   Atrial fibrilation 10/2009; and dual chamber pacemaker in 4/11; normal EF  . Sleep apnea    Nocturnal oxygen therapy  . Zoster 2016   Past Surgical History:   Procedure Laterality Date  . ABDOMINAL HYSTERECTOMY    . CATARACT EXTRACTION     Bilateral  . COLONOSCOPY  2009   Dr. Gala Romney  . EYE SURGERY     laser   . PACEMAKER INSERTION  2011   dual chamber   . PPM GENERATOR CHANGEOUT N/A 11/13/2016   Procedure: PPM Generator Changeout;  Surgeon: Evans Lance, MD;  Location: [facility];  Service: Cardiovascular;  Laterality: N/A;  . SALPINGOOPHORECTOMY  1970   right   Family History  Problem Relation Age of Onset  . Cancer Mother        gential  . Cancer Father        throat   . Pneumonia Sister   . Emphysema Sister   . Mitral valve prolapse Sister   . Heart disease Other   . Arthritis Other   . Lung disease Other   . Asthma Other   . Melanoma Son    Social History   Socioeconomic History  . Marital status: Widowed    Spouse name: Not on file  . Number of children: 7  . Years of education: college  . Highest education level: Not on file  Occupational History  . Occupation: retired     Fish farm manager: RETIRED  Social Needs  . Financial resource strain: Not very hard  . Food insecurity    Worry: Never true    Inability:  Never true  . Transportation needs    Medical: No    Non-medical: No  Tobacco Use  . Smoking status: Never Smoker  . Smokeless tobacco: Never Used  Substance and Sexual Activity  . Alcohol use: No    Alcohol/week: 0.0 standard drinks  . Drug use: No  . Sexual activity: Not Currently  Lifestyle  . Physical activity    Days per week: 2 days    Minutes per session: 30 min  . Stress: Not at all  Relationships  . Social connections    Talks on phone: More than three times a week    Gets together: Once a week    Attends religious service: Never    Active member of club or organization: No    Attends meetings of clubs or organizations: Never    Relationship status: Widowed  Other Topics Concern  . Not on file  Social History Narrative  . Not on file    Outpatient Encounter Medications as of  07/02/2019  Medication Sig  . Calcium Carb-Cholecalciferol 500-400 MG-UNIT CHEW One tablet twice daily  . hydrochlorothiazide (HYDRODIURIL) 25 MG tablet TAKE 1 TABLET BY MOUTH ONCE DAILY.  Marland Kitchen levothyroxine (SYNTHROID) 112 MCG tablet TAKE 1 TABLET BY MOUTH ONCE DAILY.  Marland Kitchen lovastatin (MEVACOR) 40 MG tablet TAKE ONE TABLET BY MOUTH DAILY.  . meclizine (ANTIVERT) 12.5 MG tablet Take 1 tablet (12.5 mg total) by mouth 2 (two) times daily as needed for dizziness.  . metoprolol tartrate (LOPRESSOR) 100 MG tablet TAKE 1 TABLET BY MOUTH TWICE A DAY.  Marland Kitchen omeprazole (PRILOSEC) 40 MG capsule TAKE (1) CAPSULE BY MOUTH ONCE DAILY FOR ACID REFLUX.  Marland Kitchen OXYGEN-HELIUM IN Inhale 2 L into the lungs at bedtime.  . Potassium 99 MG TABS Take 1 tablet by mouth daily.  Marland Kitchen warfarin (COUMADIN) 5 MG tablet TAKE 1/2 TABLET BY MOUTH DAILY EXCEPT 1 TABLET ON WEDNESDAYS & SATURDAYS OR AS DIRECTED.  . [DISCONTINUED] azithromycin (ZITHROMAX) 250 MG tablet Take 2 tablets on day one, then take one tablet once daily for an additional four days  . [DISCONTINUED] benzonatate (TESSALON PERLES) 100 MG capsule Take 1 capsule (100 mg total) by mouth every 6 (six) hours as needed for cough.  . [DISCONTINUED] penicillin v potassium (VEETID) 500 MG tablet Take 1 tablet (500 mg total) by mouth 3 (three) times daily.   No facility-administered encounter medications on file as of 07/02/2019.     Activities of Daily Living In your present state of health, do you have any difficulty performing the following activities: 07/02/2019  Hearing? Y  Vision? Y  Difficulty concentrating or making decisions? Y  Walking or climbing stairs? Y  Dressing or bathing? N  Doing errands, shopping? Y  Preparing Food and eating ? Y  Using the Toilet? N  In the past six months, have you accidently leaked urine? Y  Do you have problems with loss of bowel control? N  Managing your Medications? Y  Managing your Finances? N  Housekeeping or managing your  Housekeeping? Y  Comment daughter helps  Some recent data might be hidden    Patient Care Team: Fayrene Helper, MD as PCP - General (Family Medicine) Harl Bowie, Alphonse Guild, MD as PCP - Cardiology (Cardiology) Evans Lance, MD (Cardiology) Gala Romney Cristopher Estimable, MD as Attending Physician (Gastroenterology) Lattie Haw Cristopher Estimable, MD as Attending Physician (Cardiology) Hayden Pedro, MD as Attending Physician (Ophthalmology) Sinda Du, MD as Attending Physician (Pulmonary Disease)    Assessment:  This is a routine wellness examination for Bobbette.  Exercise Activities and Dietary recommendations Current Exercise Habits: The patient does not participate in regular exercise at present  Goals    . DIET - EAT MORE FRUITS AND VEGETABLES    . Increase physical activity     Patient would like to be able to increase her physical activity so she can be more active with her great grandchildren.    Marland Kitchen LIFESTYLE - DECREASE FALLS RISK       Fall Risk Fall Risk  07/02/2019 05/15/2019 08/08/2018 06/17/2018 03/26/2018  Falls in the past year? 1 1 1  No No  Number falls in past yr: 1 1 0 - -  Injury with Fall? 1 1 0 - -  Risk Factor Category  - - - - -  Risk for fall due to : Impaired balance/gait;Impaired mobility;Impaired vision;History of fall(s) Impaired balance/gait;History of fall(s);Impaired mobility Impaired balance/gait;Impaired mobility;Impaired vision Impaired balance/gait;Impaired vision;Impaired mobility;Medication side effect -  Follow up - - - - -  Comment - - - - -   Is the patient's home free of loose throw rugs in walkways, pet beds, electrical cords, etc?   yes      Grab bars in the bathroom? yes      Handrails on the stairs?   yes      Adequate lighting?   yes   Depression Screen PHQ 2/9 Scores 07/02/2019 08/08/2018 06/17/2018 03/26/2018  PHQ - 2 Score 0 4 0 3  PHQ- 9 Score - 9 - 6     Cognitive Function     6CIT Screen 06/17/2018 10/18/2016  What Year? 0 points 0  points  What month? 0 points 0 points  What time? 0 points 0 points  Count back from 20 2 points 0 points  Months in reverse 0 points 0 points  Repeat phrase 0 points 0 points  Total Score 2 0    Immunization History  Administered Date(s) Administered  . Fluad Quad(high Dose 65+) 05/15/2019  . H1N1 07/01/2008  . Influenza Split 06/14/2012  . Influenza Whole 06/22/2006, 05/27/2008, 06/10/2009, 06/09/2010, 05/10/2011  . Influenza,inj,Quad PF,6+ Mos 06/09/2013, 08/12/2014, 07/15/2015, 04/19/2016, 07/04/2017, 06/17/2018  . Pneumococcal Conjugate-13 03/18/2014  . Pneumococcal Polysaccharide-23 06/09/2008  . Tdap 04/22/2015    Qualifies for Shingles Vaccine? Reports having, does not want shot  Screening Tests Health Maintenance  Topic Date Due  . TETANUS/TDAP  04/21/2025  . INFLUENZA VACCINE  Completed  . DEXA SCAN  Completed  . PNA vac Low Risk Adult  Completed    Cancer Screenings: Lung: Low Dose CT Chest recommended if Age 51-80 years, 30 pack-year currently smoking OR have quit w/in 15years. Patient does not qualify. Breast:  Up to date on Mammogram? yes   Up to date of Bone Density/Dexa? yes Colorectal: no longer needed   Additional Screenings:  Hepatitis C Screening:     Plan:     1. Encounter for Medicare annual wellness exam  I have personally reviewed and noted the following in the patient's chart:   . Medical and social history . Use of alcohol, tobacco or illicit drugs  . Current medications and supplements . Functional ability and status . Nutritional status . Physical activity . Advanced directives . List of other physicians . Hospitalizations, surgeries, and ER visits in previous 12 months . Vitals . Screenings to include cognitive, depression, and falls . Referrals and appointments  In addition, I have reviewed and discussed with patient certain preventive protocols, quality  metrics, and best practice recommendations. A written personalized care  plan for preventive services as well as general preventive health recommendations were provided to patient.     I provided 20 minutes of non-face-to-face time during this encounter.    Kate Sable, LPN, LPN  [phone]

## 2019-07-02 NOTE — Patient Instructions (Addendum)
[name] , Thank you for taking time to come for your Medicare Wellness Visit. I appreciate your ongoing commitment to your health goals. Please review the following plan we discussed and let me know if I can assist you in the future.   Please continue to practice social distancing to keep you, your family, and our community safe.  If you must go out, please wear a Mask and practice good handwashing.  Screening recommendations/referrals: Colonoscopy: no longer needed Mammogram: no longer needed Bone Density: up to date Recommended yearly ophthalmology/optometry visit for glaucoma screening and checkup Recommended yearly dental visit for hygiene and checkup  Vaccinations: Influenza vaccine: completed, due Fall 2021  Pneumococcal vaccine: Completed Tdap vaccine: Up to date  Advanced directives: you reported having paperwork  Conditions/risks identified: Fall  Next appointment: 08/14/2019    Preventive Care 83 Years and Older, Female Preventive care refers to lifestyle choices and visits with your health care provider that can promote health and wellness. What does preventive care include?  A yearly physical exam. This is also called an annual well check.  Dental exams once or twice a year.  Routine eye exams. Ask your health care provider how often you should have your eyes checked.  Personal lifestyle choices, including:  Daily care of your teeth and gums.  Regular physical activity.  Eating a healthy diet.  Avoiding tobacco and drug use.  Limiting alcohol use.  Practicing safe sex.  Taking low-dose aspirin every day.  Taking vitamin and mineral supplements as recommended by your health care provider. What happens during an annual well check? The services and screenings done by your health care provider during your annual well check will depend on your age, overall health, lifestyle risk factors, and family history of disease. Counseling  Your health care  provider may ask you questions about your:  Alcohol use.  Tobacco use.  Drug use.  Emotional well-being.  Home and relationship well-being.  Sexual activity.  Eating habits.  History of falls.  Memory and ability to understand (cognition).  Work and work Statistician.  Reproductive health. Screening  You may have the following tests or measurements:  Height, weight, and BMI.  Blood pressure.  Lipid and cholesterol levels. These may be checked every 5 years, or more frequently if you are over 23 years old.  Skin check.  Lung cancer screening. You may have this screening every year starting at age 50 if you have a 30-pack-year history of smoking and currently smoke or have quit within the past 15 years.  Fecal occult blood test (FOBT) of the stool. You may have this test every year starting at age 70.  Flexible sigmoidoscopy or colonoscopy. You may have a sigmoidoscopy every 5 years or a colonoscopy every 10 years starting at age 54.  Hepatitis C blood test.  Hepatitis B blood test.  Sexually transmitted disease (STD) testing.  Diabetes screening. This is done by checking your blood sugar (glucose) after you have not eaten for a while (fasting). You may have this done every 1-3 years.  Bone density scan. This is done to screen for osteoporosis. You may have this done starting at age 33.  Mammogram. This may be done every 1-2 years. Talk to your health care provider about how often you should have regular mammograms. Talk with your health care provider about your test results, treatment options, and if necessary, the need for more tests. Vaccines  Your health care provider may recommend certain vaccines, such as:  Influenza  vaccine. This is recommended every year.  Tetanus, diphtheria, and acellular pertussis (Tdap, Td) vaccine. You may need a Td booster every 10 years.  Zoster vaccine. You may need this after age 94.  Pneumococcal 13-valent conjugate (PCV13)  vaccine. One dose is recommended after age 17.  Pneumococcal polysaccharide (PPSV23) vaccine. One dose is recommended after age 2. Talk to your health care provider about which screenings and vaccines you need and how often you need them. This information is not intended to replace advice given to you by your health care provider. Make sure you discuss any questions you have with your health care provider. Document Released: 09/10/2015 Document Revised: 05/03/2016 Document Reviewed: 06/15/2015 Elsevier Interactive Patient Education  2017 [place] Prevention in the Home Falls can cause injuries. They can happen to people of all ages. There are many things you can do to make your home safe and to help prevent falls. What can I do on the outside of my home?  Regularly fix the edges of walkways and driveways and fix any cracks.  Remove anything that might make you trip as you walk through a door, such as a raised step or threshold.  Trim any bushes or trees on the path to your home.  Use bright outdoor lighting.  Clear any walking paths of anything that might make someone trip, such as rocks or tools.  Regularly check to see if handrails are loose or broken. Make sure that both sides of any steps have handrails.  Any raised decks and porches should have guardrails on the edges.  Have any leaves, snow, or ice cleared regularly.  Use sand or salt on walking paths during winter.  Clean up any spills in your garage right away. This includes oil or grease spills. What can I do in the bathroom?  Use night lights.  Install grab bars by the toilet and in the tub and shower. Do not use towel bars as grab bars.  Use non-skid mats or decals in the tub or shower.  If you need to sit down in the shower, use a plastic, non-slip stool.  Keep the floor dry. Clean up any water that spills on the floor as soon as it happens.  Remove soap buildup in the tub or shower regularly.   Attach bath mats securely with double-sided non-slip rug tape.  Do not have throw rugs and other things on the floor that can make you trip. What can I do in the bedroom?  Use night lights.  Make sure that you have a light by your bed that is easy to reach.  Do not use any sheets or blankets that are too big for your bed. They should not hang down onto the floor.  Have a firm chair that has side arms. You can use this for support while you get dressed.  Do not have throw rugs and other things on the floor that can make you trip. What can I do in the kitchen?  Clean up any spills right away.  Avoid walking on wet floors.  Keep items that you use a lot in easy-to-reach places.  If you need to reach something above you, use a strong step stool that has a grab bar.  Keep electrical cords out of the way.  Do not use floor polish or wax that makes floors slippery. If you must use wax, use non-skid floor wax.  Do not have throw rugs and other things on the floor that can  make you trip. What can I do with my stairs?  Do not leave any items on the stairs.  Make sure that there are handrails on both sides of the stairs and use them. Fix handrails that are broken or loose. Make sure that handrails are as long as the stairways.  Check any carpeting to make sure that it is firmly attached to the stairs. Fix any carpet that is loose or worn.  Avoid having throw rugs at the top or bottom of the stairs. If you do have throw rugs, attach them to the floor with carpet tape.  Make sure that you have a light switch at the top of the stairs and the bottom of the stairs. If you do not have them, ask someone to add them for you. What else can I do to help prevent falls?  Wear shoes that:  Do not have high heels.  Have rubber bottoms.  Are comfortable and fit you well.  Are closed at the toe. Do not wear sandals.  If you use a stepladder:  Make sure that it is fully opened. Do not climb  a closed stepladder.  Make sure that both sides of the stepladder are locked into place.  Ask someone to hold it for you, if possible.  Clearly mark and make sure that you can see:  Any grab bars or handrails.  First and last steps.  Where the edge of each step is.  Use tools that help you move around (mobility aids) if they are needed. These include:  Canes.  Walkers.  Scooters.  Crutches.  Turn on the lights when you go into a dark area. Replace any light bulbs as soon as they burn out.  Set up your furniture so you have a clear path. Avoid moving your furniture around.  If any of your floors are uneven, fix them.  If there are any pets around you, be aware of where they are.  Review your medicines with your doctor. Some medicines can make you feel dizzy. This can increase your chance of falling. Ask your doctor what other things that you can do to help prevent falls. This information is not intended to replace advice given to you by your health care provider. Make sure you discuss any questions you have with your health care provider. Document Released: 06/10/2009 Document Revised: 01/20/2016 Document Reviewed: 09/18/2014 Elsevier Interactive Patient Education  2017 [employer].

## 2019-07-03 ENCOUNTER — Ambulatory Visit (INDEPENDENT_AMBULATORY_CARE_PROVIDER_SITE_OTHER)
Admission: EM | Admit: 2019-07-03 | Discharge: 2019-07-03 | Disposition: A | Discharge status: Home / Self Care | Payer: Medicare Other | Source: Home / Self Care

## 2019-07-03 ENCOUNTER — Inpatient Hospital Stay (HOSPITAL_COMMUNITY)
Admission: EM | Admit: 2019-07-03 | Discharge: 2019-07-05 | DRG: 309 | Disposition: A | Discharge status: Home / Self Care | Payer: Medicare Other | Attending: Internal Medicine | Admitting: Internal Medicine

## 2019-07-03 ENCOUNTER — Telehealth: Payer: Self-pay | Admitting: Cardiology

## 2019-07-03 ENCOUNTER — Telehealth: Payer: Self-pay

## 2019-07-03 ENCOUNTER — Other Ambulatory Visit: Payer: Self-pay

## 2019-07-03 ENCOUNTER — Encounter (HOSPITAL_COMMUNITY): Payer: Self-pay

## 2019-07-03 ENCOUNTER — Emergency Department (HOSPITAL_COMMUNITY): Payer: Medicare Other

## 2019-07-03 DIAGNOSIS — H9190 Unspecified hearing loss, unspecified ear: Secondary | ICD-10-CM | POA: Diagnosis not present

## 2019-07-03 DIAGNOSIS — R531 Weakness: Secondary | ICD-10-CM | POA: Diagnosis not present

## 2019-07-03 DIAGNOSIS — Z03818 Encounter for observation for suspected exposure to other biological agents ruled out: Secondary | ICD-10-CM | POA: Diagnosis not present

## 2019-07-03 DIAGNOSIS — E876 Hypokalemia: Secondary | ICD-10-CM | POA: Diagnosis not present

## 2019-07-03 DIAGNOSIS — Z9181 History of falling: Secondary | ICD-10-CM | POA: Diagnosis not present

## 2019-07-03 DIAGNOSIS — Z950 Presence of cardiac pacemaker: Secondary | ICD-10-CM | POA: Diagnosis not present

## 2019-07-03 DIAGNOSIS — K30 Functional dyspepsia: Secondary | ICD-10-CM | POA: Diagnosis present

## 2019-07-03 DIAGNOSIS — Z79899 Other long term (current) drug therapy: Secondary | ICD-10-CM | POA: Diagnosis not present

## 2019-07-03 DIAGNOSIS — E871 Hypo-osmolality and hyponatremia: Secondary | ICD-10-CM | POA: Diagnosis not present

## 2019-07-03 DIAGNOSIS — I4891 Unspecified atrial fibrillation: Secondary | ICD-10-CM | POA: Diagnosis not present

## 2019-07-03 DIAGNOSIS — R0989 Other specified symptoms and signs involving the circulatory and respiratory systems: Secondary | ICD-10-CM | POA: Diagnosis not present

## 2019-07-03 DIAGNOSIS — I10 Essential (primary) hypertension: Secondary | ICD-10-CM | POA: Diagnosis present

## 2019-07-03 DIAGNOSIS — M1990 Unspecified osteoarthritis, unspecified site: Secondary | ICD-10-CM | POA: Diagnosis not present

## 2019-07-03 DIAGNOSIS — H3530 Unspecified macular degeneration: Secondary | ICD-10-CM | POA: Diagnosis present

## 2019-07-03 DIAGNOSIS — Z20828 Contact with and (suspected) exposure to other viral communicable diseases: Secondary | ICD-10-CM | POA: Diagnosis present

## 2019-07-03 DIAGNOSIS — E039 Hypothyroidism, unspecified: Secondary | ICD-10-CM | POA: Diagnosis present

## 2019-07-03 DIAGNOSIS — R000 Tachycardia, unspecified: Secondary | ICD-10-CM | POA: Diagnosis not present

## 2019-07-03 DIAGNOSIS — E785 Hyperlipidemia, unspecified: Secondary | ICD-10-CM | POA: Diagnosis not present

## 2019-07-03 DIAGNOSIS — R42 Dizziness and giddiness: Secondary | ICD-10-CM | POA: Diagnosis not present

## 2019-07-03 DIAGNOSIS — E038 Other specified hypothyroidism: Secondary | ICD-10-CM | POA: Diagnosis not present

## 2019-07-03 DIAGNOSIS — G4733 Obstructive sleep apnea (adult) (pediatric): Secondary | ICD-10-CM | POA: Diagnosis not present

## 2019-07-03 DIAGNOSIS — I480 Paroxysmal atrial fibrillation: Secondary | ICD-10-CM | POA: Diagnosis not present

## 2019-07-03 DIAGNOSIS — Z7901 Long term (current) use of anticoagulants: Secondary | ICD-10-CM

## 2019-07-03 DIAGNOSIS — Z79890 Hormone replacement therapy (postmenopausal): Secondary | ICD-10-CM

## 2019-07-03 DIAGNOSIS — Z9981 Dependence on supplemental oxygen: Secondary | ICD-10-CM | POA: Diagnosis not present

## 2019-07-03 DIAGNOSIS — I495 Sick sinus syndrome: Secondary | ICD-10-CM | POA: Diagnosis present

## 2019-07-03 DIAGNOSIS — Z743 Need for continuous supervision: Secondary | ICD-10-CM | POA: Diagnosis not present

## 2019-07-03 DIAGNOSIS — I499 Cardiac arrhythmia, unspecified: Secondary | ICD-10-CM | POA: Diagnosis not present

## 2019-07-03 DIAGNOSIS — J309 Allergic rhinitis, unspecified: Secondary | ICD-10-CM | POA: Diagnosis not present

## 2019-07-03 DIAGNOSIS — K219 Gastro-esophageal reflux disease without esophagitis: Secondary | ICD-10-CM | POA: Diagnosis not present

## 2019-07-03 HISTORY — DX: Unspecified atrial fibrillation: I48.91

## 2019-07-03 LAB — CBC WITH DIFFERENTIAL/PLATELET
Abs Immature Granulocytes: 0.03 10*3/uL (ref 0.00–0.07)
Basophils Absolute: 0.1 10*3/uL (ref 0.0–0.1)
Basophils Relative: 1 %
Eosinophils Absolute: 0.2 10*3/uL (ref 0.0–0.5)
Eosinophils Relative: 2 %
HCT: 33.7 % — ABNORMAL LOW (ref 36.0–46.0)
Hemoglobin: 11.4 g/dL — ABNORMAL LOW (ref 12.0–15.0)
Immature Granulocytes: 0 %
Lymphocytes Relative: 19 %
Lymphs Abs: 1.9 10*3/uL (ref 0.7–4.0)
MCH: 29.9 pg (ref 26.0–34.0)
MCHC: 33.8 g/dL (ref 30.0–36.0)
MCV: 88.5 fL (ref 80.0–100.0)
Monocytes Absolute: 0.6 10*3/uL (ref 0.1–1.0)
Monocytes Relative: 6 %
Neutro Abs: 7.2 10*3/uL (ref 1.7–7.7)
Neutrophils Relative %: 72 %
Platelets: 303 10*3/uL (ref 150–400)
RBC: 3.81 MIL/uL — ABNORMAL LOW (ref 3.87–5.11)
RDW: 12.6 % (ref 11.5–15.5)
WBC: 10 10*3/uL (ref 4.0–10.5)
nRBC: 0 % (ref 0.0–0.2)

## 2019-07-03 LAB — PROTIME-INR
INR: 2.7 — ABNORMAL HIGH (ref 0.8–1.2)
Prothrombin Time: 28.1 seconds — ABNORMAL HIGH (ref 11.4–15.2)

## 2019-07-03 LAB — TROPONIN I (HIGH SENSITIVITY)
Troponin I (High Sensitivity): 5 ng/L (ref <18)
Troponin I (High Sensitivity): 5 ng/L (ref <18)

## 2019-07-03 LAB — BASIC METABOLIC PANEL
Anion gap: 11 (ref 5–15)
BUN: 18 mg/dL (ref 8–23)
CO2: 25 mmol/L (ref 22–32)
Calcium: 9.4 mg/dL (ref 8.9–10.3)
Chloride: 89 mmol/L — ABNORMAL LOW (ref 98–111)
Creatinine, Ser: 0.96 mg/dL (ref 0.44–1.00)
GFR calc Af Amer: 60 mL/min — ABNORMAL LOW (ref >60)
GFR calc non Af Amer: 52 mL/min — ABNORMAL LOW (ref >60)
Glucose, Bld: 110 mg/dL — ABNORMAL HIGH (ref 70–99)
Potassium: 4.2 mmol/L (ref 3.5–5.1)
Sodium: 125 mmol/L — ABNORMAL LOW (ref 135–145)

## 2019-07-03 LAB — BRAIN NATRIURETIC PEPTIDE: B Natriuretic Peptide: 232 pg/mL — ABNORMAL HIGH (ref 0.0–100.0)

## 2019-07-03 MED ORDER — METOPROLOL TARTRATE 50 MG PO TABS
100.0000 mg | ORAL_TABLET | Freq: Two times a day (BID) | ORAL | Status: DC
  Administered 2019-07-04 – 2019-07-05 (×3): 100 mg via ORAL
  Filled 2019-07-03 (×3): qty 2

## 2019-07-03 MED ORDER — ACETAMINOPHEN 650 MG RE SUPP: 650.0000 mg | Freq: Four times a day (QID) | RECTAL | Status: DC | PRN

## 2019-07-03 MED ORDER — PRAVASTATIN SODIUM 20 MG PO TABS: 40.0000 mg | ORAL_TABLET | Freq: Every day | ORAL | Status: DC

## 2019-07-03 MED ORDER — LEVOTHYROXINE SODIUM 112 MCG PO TABS
112.0000 ug | ORAL_TABLET | Freq: Every day | ORAL | Status: DC
  Administered 2019-07-04 – 2019-07-05 (×2): 112 ug via ORAL
  Filled 2019-07-03 (×7): qty 1

## 2019-07-03 MED ORDER — FLUTICASONE PROPIONATE 50 MCG/ACT NA SUSP
1.0000 | Freq: Every day | NASAL | Status: DC
  Filled 2019-07-03: qty 16

## 2019-07-03 MED ORDER — WARFARIN - PHARMACIST DOSING INPATIENT
Freq: Every day | Status: DC
  Administered 2019-07-04: 17:00:00

## 2019-07-03 MED ORDER — WARFARIN SODIUM 2.5 MG PO TABS
2.5000 mg | ORAL_TABLET | Freq: Once | ORAL | Status: AC
  Administered 2019-07-04: 2.5 mg via ORAL
  Filled 2019-07-03 (×2): qty 1

## 2019-07-03 MED ORDER — DILTIAZEM HCL-DEXTROSE 125-5 MG/125ML-% IV SOLN (PREMIX)
5.0000 mg/h | INTRAVENOUS | Status: DC
  Administered 2019-07-03: 5 mg/h via INTRAVENOUS
  Filled 2019-07-03: qty 125

## 2019-07-03 MED ORDER — PANTOPRAZOLE SODIUM 40 MG PO TBEC
40.0000 mg | DELAYED_RELEASE_TABLET | Freq: Every day | ORAL | Status: DC
  Administered 2019-07-04 – 2019-07-05 (×2): 40 mg via ORAL
  Filled 2019-07-03 (×2): qty 1

## 2019-07-03 MED ORDER — ACETAMINOPHEN 325 MG PO TABS
650.0000 mg | ORAL_TABLET | Freq: Four times a day (QID) | ORAL | Status: DC | PRN
  Administered 2019-07-04: 650 mg via ORAL
  Filled 2019-07-03 (×2): qty 2

## 2019-07-03 NOTE — Telephone Encounter (Addendum)
Returned pt's daughters call. She states that for the last week or so her mother's heart rate has been elevated in to low 100's. (100, 108)  She has not had any strength to get up and do anything. She has been very tired. She states that she has been complaining with some indigestion and stomach pain. She has been having some frequent falls as well. I advised her to call Dr. Moshe Cipro about her stomach issues, she voiced understanding of plan. She wanted to know what she should do about the increased heart rates. Please advise.

## 2019-07-03 NOTE — ED Provider Notes (Signed)
[facility] EMERGENCY DEPARTMENT Provider Note   CSN: [id number] Arrival date & time: 07/03/19  1633     History   Chief Complaint Chief Complaint  Patient presents with  . Atrial Fibrillation    HPI [name] is a 85 y.o. female.     Patient is a 85 year old female with past medical history of GERD, paroxysmal A. fib, sick sinus requiring pacemaker, and macular degeneration.  She presents today for evaluation of elevated heart rate.  Patient had a visit today from home health.  Patient states that she did not feel well and did not want to have her therapy session.  The therapist checked her heart rate and it was in the 120s and irregular.  Patient was then sent here for further evaluation.  She denies to me she is experiencing any chest pain.  She does feel weak.  She denies any fevers or chills.  The history is provided by the patient.  Atrial Fibrillation This is a new problem. Episode onset: unknown. The problem occurs constantly. The problem has not changed since onset.Pertinent negatives include no chest pain and no shortness of breath. Nothing aggravates the symptoms. Nothing relieves the symptoms. She has tried nothing for the symptoms.    Past Medical History:  Diagnosis Date  . Arthritis   . Gastroesophageal reflux disease   . Hearing impairment   . Hyperlipidemia   . Hypertension   . Hypothyroidism   . Impaired glucose tolerance   . Macular degeneration   . Pancreatitis   . Paroxysmal atrial fibrillation ([place]) 2011  . Sick sinus syndrome ([place]) 2011   Atrial fibrilation 10/2009; and dual chamber pacemaker in 4/11; normal EF  . Sleep apnea    Nocturnal oxygen therapy  . Zoster 2016    Patient Active Problem List   Diagnosis Date Noted  . Light headedness 05/18/2019  . Vertigo 05/18/2019  . Cough 12/23/2016  . Nocturnal hypoxia 12/03/2016  . Osteopenia 10/18/2016  . At high risk for falls 10/21/2015  . Need for immunization against influenza  09/01/2014  . Seasonal allergies 05/05/2014  . Ethmoid sinusitis 02/05/2013  . Sick sinus syndrome ([place])   . Chronic anticoagulation 11/18/2010  . Impaired fasting glucose 10/14/2010  . Paroxysmal atrial fibrillation ([place]) 06/14/2010  . 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 Hypothyroidism 02/25/2008  . Hyperlipidemia 02/25/2008  . HEARING LOSS 02/25/2008  . Essential hypertension 02/25/2008    Past Surgical History:  Procedure Laterality Date  . ABDOMINAL HYSTERECTOMY    . CATARACT EXTRACTION     Bilateral  . COLONOSCOPY  2009   Dr. Gala Romney  . EYE SURGERY     laser   . PACEMAKER INSERTION  2011   dual chamber   . PPM GENERATOR CHANGEOUT N/A 11/13/2016   Procedure: PPM Generator Changeout;  Surgeon: Evans Lance, MD;  Location: [place] [facility];  Service: Cardiovascular;  Laterality: N/A;  . SALPINGOOPHORECTOMY  1970   right     OB History   No obstetric history on file.      Home Medications    Prior to Admission medications   Medication Sig Start Date End Date Taking? Authorizing Provider  Calcium Carb-Cholecalciferol 500-400 MG-UNIT CHEW One tablet twice daily 10/07/17   Fayrene Helper, MD  hydrochlorothiazide (HYDRODIURIL) 25 MG tablet TAKE 1 TABLET BY MOUTH ONCE DAILY. 06/27/19   Arnoldo Lenis, MD  levothyroxine (SYNTHROID) 112 MCG tablet TAKE 1 TABLET BY MOUTH ONCE DAILY. 04/02/19   Fayrene Helper,  MD  lovastatin (MEVACOR) 40 MG tablet TAKE ONE TABLET BY MOUTH DAILY. 12/23/18   Arnoldo Lenis, MD  meclizine (ANTIVERT) 12.5 MG tablet Take 1 tablet (12.5 mg total) by mouth 2 (two) times daily as needed for dizziness. 05/15/19   Fayrene Helper, MD  metoprolol tartrate (LOPRESSOR) 100 MG tablet TAKE 1 TABLET BY MOUTH TWICE A DAY. 09/09/18   Arnoldo Lenis, MD  omeprazole (PRILOSEC) 40 MG capsule TAKE (1) CAPSULE BY MOUTH ONCE DAILY FOR ACID REFLUX. 06/09/19   Fayrene Helper, MD  OXYGEN-HELIUM IN Inhale 2 L into the lungs at bedtime.    [provider]  Potassium 99 MG TABS Take 1 tablet by mouth daily.    [provider]  warfarin (COUMADIN) 5 MG tablet TAKE 1/2 TABLET BY MOUTH DAILY EXCEPT 1 TABLET ON WEDNESDAYS & SATURDAYS OR AS DIRECTED. 05/26/19   Arnoldo Lenis, MD  benzonatate (TESSALON PERLES) 100 MG capsule Take 1 capsule (100 mg total) by mouth every 6 (six) hours as needed for cough. 02/05/13 11/04/18  Fayrene Helper, MD    Family History Family History  Problem Relation Age of Onset  . Cancer Mother        gential  . Cancer Father        throat   . Pneumonia Sister   . Emphysema Sister   . Mitral valve prolapse Sister   . Heart disease Other   . Arthritis Other   . Lung disease Other   . Asthma Other   . Melanoma Son     Social History Social History   Tobacco Use  . Smoking status: Never Smoker  . Smokeless tobacco: Never Used  Substance Use Topics  . Alcohol use: No    Alcohol/week: 0.0 standard drinks  . Drug use: No     Allergies   Patient has no known allergies.   Review of Systems Review of Systems  Respiratory: Negative for shortness of breath.   Cardiovascular: Negative for chest pain.  All other systems reviewed and are negative.    Physical Exam Updated Vital Signs BP (!) 142/99 (BP Location: Right Arm)   Pulse (!) 118   Temp 98 F (36.7 C) (Oral)   Wt 71 kg   SpO2 99%   BMI 25.26 kg/m   Physical Exam Vitals signs and nursing note reviewed.  Constitutional:      General: She is not in acute distress.    Appearance: She is well-developed. She is not diaphoretic.  HENT:     Head: Normocephalic and atraumatic.  Neck:     Musculoskeletal: Normal range of motion and neck supple.  Cardiovascular:     Rate and Rhythm: Tachycardia present. Rhythm irregular.     Heart sounds: No murmur. No friction rub. No gallop.   Pulmonary:     Effort: Pulmonary effort is normal. No respiratory distress.     Breath sounds: Normal breath sounds. No wheezing.   Abdominal:     General: Bowel sounds are normal. There is no distension.     Palpations: Abdomen is soft.     Tenderness: There is no abdominal tenderness.  Musculoskeletal: Normal range of motion.        General: No swelling or tenderness.     Right lower leg: No edema.     Left lower leg: No edema.  Skin:    General: Skin is warm and dry.  Neurological:     Mental Status: She is  alert and oriented to person, place, and time.      ED Treatments / Results  Labs (all labs ordered are listed, but only abnormal results are displayed) Labs Reviewed  BASIC METABOLIC PANEL  CBC WITH DIFFERENTIAL/PLATELET  BRAIN NATRIURETIC PEPTIDE  TROPONIN I (HIGH SENSITIVITY)    EKG EKG Interpretation  Date/Time:  Thursday July 03 2019 16:42:33 EST Ventricular Rate:  118 PR Interval:    QRS Duration: 129 QT Interval:  350 QTC Calculation: 491 R Axis:   -84 Text Interpretation: Atrial fibrillation RBBB and LAFB Confirmed by Veryl Speak [phone]) on 07/03/2019 4:46:09 PM   Radiology No results found.  Procedures Procedures (including critical care time)  Medications Ordered in ED Medications - No data to display   Initial Impression / Assessment and Plan / ED Course  I have reviewed the triage vital signs and the nursing notes.  Pertinent labs & imaging results that were available during my care of the patient were reviewed by me and considered in my medical decision making (see chart for details).  Patient with history of pacemaker, paroxysmal afib presenting with complaints of weakness and elevated heart rate.  Her home health therapist checked her heart rate and had her seen in urgent care and then was sent here for A. fib with RVR.  Patient's laboratory studies are unremarkable.  Her blood pressure is stable.  Patient will be started on Cardizem and admitted to the hospitalist service for rate control and cardiology consultation in the morning.  I had discussed the  possibility of cardioversion with the patient and the daughter, however they are declining this.  She is on Coumadin with an INR of 2.7.  CRITICAL CARE Performed by: Veryl Speak Total critical care time: 35 minutes Critical care time was exclusive of separately billable procedures and treating other patients. Critical care was necessary to treat or prevent imminent or life-threatening deterioration. Critical care was time spent personally by me on the following activities: development of treatment plan with patient and/or surrogate as well as nursing, discussions with consultants, evaluation of patient's response to treatment, examination of patient, obtaining history from patient or surrogate, ordering and performing treatments and interventions, ordering and review of laboratory studies, ordering and review of radiographic studies, pulse oximetry and re-evaluation of patient's condition.   Final Clinical Impressions(s) / ED Diagnoses   Final diagnoses:  None    ED Discharge Orders    None       Veryl Speak, MD 07/03/19 1927

## 2019-07-03 NOTE — ED Notes (Signed)
Pt given sandwhich 

## 2019-07-03 NOTE — Telephone Encounter (Signed)
Agree with pcp evaluation. Just mildly elevated heart rates that likely are in response to whatever else is going on that's causing her to feel bad. I think the heart rate is a response and not the cause of any symptom, needs pcp to evaluate what the underlying issue is such as infection   Zandra Abts MD

## 2019-07-03 NOTE — Telephone Encounter (Signed)
Pt daughter wanted Dr. Lovena Le to be made aware of this since she has a pacemaker

## 2019-07-03 NOTE — Telephone Encounter (Signed)
noted 

## 2019-07-03 NOTE — Telephone Encounter (Signed)
Stacy - Physical Therapist w/ [facility] called stating that pt's heart rate at rest is around 100, states pt is not feeling well.. she's having problems w/ indigestion and stomach pain   Please give the patients daughter [name] a call [phone]

## 2019-07-03 NOTE — Telephone Encounter (Signed)
Called pt's daughter. She states that she is at urgent care now. Dr. Moshe Cipro wanted her to go there for EKG. Nurse from Urgent Care will fax over for review.

## 2019-07-03 NOTE — Telephone Encounter (Signed)
Patients daughter called stating she is still having lots of indigestion with burping and belching and she is feeling nauseous. Took two gas pills this morning and drank ginger ale and this didn't help. Heart rate has been up and she has called her cardiologist about this. She is taking prilosec as directed. Patient is not eating anything spicy at all. Please advise.

## 2019-07-03 NOTE — Telephone Encounter (Signed)
[facility], called to inform Dr. Harl Bowie that pt is in afib, with RVR. They are going to send her to [facility] to slow her heart rate down and for evaluation. Copy of EKG faxed to [facility] for Dr. Harl Bowie to review.

## 2019-07-03 NOTE — ED Provider Notes (Signed)
[facility]   [id number] 07/03/19 Arrival Time: [id number]  ABBREVIATED NOTE:  CC: EKG  SUBJECTIVE: HPI obtained from daughters  [name] is a 85 y.o. female who presents for EKG per PCP office recommendation.  Patient was seen by physical therapist today, and was informed heart rate was elevated.  Hx significant for atrial fibrillation and has a pacemaker.  Was instructed by PCP office to come here for EKG.  Denies chest pain or SOB.  Just admits to intermittent indigestion, increased belching, nausea, and not feeling well over the past few weeks. Has been taking nexium and avoiding spicy foods without relief.  Denies abdominal pain.    ROS: As per HPI.  All other pertinent ROS negative.    Past Medical History:  Diagnosis Date  . Arthritis   . Gastroesophageal reflux disease   . Hearing impairment   . Hyperlipidemia   . Hypertension   . Hypothyroidism   . Impaired glucose tolerance   . Macular degeneration   . Pancreatitis   . Paroxysmal atrial fibrillation ([place]) 2011  . Sick sinus syndrome ([place]) 2011   Atrial fibrilation 10/2009; and dual chamber pacemaker in 4/11; normal EF  . Sleep apnea    Nocturnal oxygen therapy  . Zoster 2016   Past Surgical History:  Procedure Laterality Date  . ABDOMINAL HYSTERECTOMY    . CATARACT EXTRACTION     Bilateral  . COLONOSCOPY  2009   Dr. Gala Romney  . EYE SURGERY     laser   . PACEMAKER INSERTION  2011   dual chamber   . PPM GENERATOR CHANGEOUT N/A 11/13/2016   Procedure: PPM Generator Changeout;  Surgeon: Evans Lance, MD;  Location: [place] [facility];  Service: Cardiovascular;  Laterality: N/A;  . SALPINGOOPHORECTOMY  1970   right   No Known Allergies No current facility-administered medications on file prior to encounter.    Current Outpatient Medications on File Prior to Encounter  Medication Sig Dispense Refill  . Calcium Carb-Cholecalciferol 500-400 MG-UNIT CHEW One tablet twice daily 60 tablet   .  hydrochlorothiazide (HYDRODIURIL) 25 MG tablet TAKE 1 TABLET BY MOUTH ONCE DAILY. 90 tablet 1  . levothyroxine (SYNTHROID) 112 MCG tablet TAKE 1 TABLET BY MOUTH ONCE DAILY. 90 tablet 0  . lovastatin (MEVACOR) 40 MG tablet TAKE ONE TABLET BY MOUTH DAILY. 30 tablet 6  . meclizine (ANTIVERT) 12.5 MG tablet Take 1 tablet (12.5 mg total) by mouth 2 (two) times daily as needed for dizziness. 30 tablet 0  . metoprolol tartrate (LOPRESSOR) 100 MG tablet TAKE 1 TABLET BY MOUTH TWICE A DAY. 180 tablet 3  . omeprazole (PRILOSEC) 40 MG capsule TAKE (1) CAPSULE BY MOUTH ONCE DAILY FOR ACID REFLUX. 30 capsule 0  . OXYGEN-HELIUM IN Inhale 2 L into the lungs at bedtime.    . Potassium 99 MG TABS Take 1 tablet by mouth daily.    Marland Kitchen warfarin (COUMADIN) 5 MG tablet TAKE 1/2 TABLET BY MOUTH DAILY EXCEPT 1 TABLET ON WEDNESDAYS & SATURDAYS OR AS DIRECTED. 25 tablet 2  . [DISCONTINUED] benzonatate (TESSALON PERLES) 100 MG capsule Take 1 capsule (100 mg total) by mouth every 6 (six) hours as needed for cough. 30 capsule 0   Social History   Socioeconomic History  . Marital status: Widowed    Spouse name: Not on file  . Number of children: 7  . Years of education: college  . Highest education level: Not on file  Occupational History  . Occupation:  retired     Fish farm manager: RETIRED  Social Needs  . Financial resource strain: Not very hard  . Food insecurity    Worry: Never true    Inability: Never true  . Transportation needs    Medical: No    Non-medical: No  Tobacco Use  . Smoking status: Never Smoker  . Smokeless tobacco: Never Used  Substance and Sexual Activity  . Alcohol use: No    Alcohol/week: 0.0 standard drinks  . Drug use: No  . Sexual activity: Not Currently  Lifestyle  . Physical activity    Days per week: 2 days    Minutes per session: 30 min  . Stress: Not at all  Relationships  . Social connections    Talks on phone: More than three times a week    Gets together: Once a week     Attends religious service: Never    Active member of club or organization: No    Attends meetings of clubs or organizations: Never    Relationship status: Widowed  . Intimate partner violence    Fear of current or ex partner: No    Emotionally abused: No    Physically abused: No    Forced sexual activity: No  Other Topics Concern  . Not on file  Social History Narrative  . Not on file   Family History  Problem Relation Age of Onset  . Cancer Mother        gential  . Cancer Father        throat   . Pneumonia Sister   . Emphysema Sister   . Mitral valve prolapse Sister   . Heart disease Other   . Arthritis Other   . Lung disease Other   . Asthma Other   . Melanoma Son      OBJECTIVE:  Vitals:   07/03/19 1510  BP: (!) 145/89  Pulse: (!) 120  Resp: 20  Temp: 98.6 F (37 C)  SpO2: 98%    General appearance: alert; no distress Eyes: PERRLA; EOMI; conjunctiva normal HENT: normocephalic; atraumatic; oropharynx clear Neck: supple; no carotid bruits Lungs: clear to auscultation bilaterally without adventitious breath sounds Heart: Regularly irregular Abdomen: soft, non-tender; bowel sounds normal; no guarding Extremities: no cyanosis or edema; symmetrical with no gross deformities Skin: warm and dry Psychological: alert and cooperative; normal mood and affect  ECG: Orders placed or performed in visit on 07/03/19  . EKG   EKG with atrial fibrillation with RVR; changes from past EKG in January of 2019.    ASSESSMENT & PLAN:  1. Atrial fibrillation with RVR ([place])   2. Indigestion    Recommending further evaluation and management in the ED for atrial fibrillation for RVR.  Spoke to cardiology office and they recommended further evaluaiton n the ED as well.  Patient and family aware and in agreement with this plan.  EMS called, and patient transported via EMS to [facility] ED in stable condition.     Lestine Box, PA-C 07/03/19 1640

## 2019-07-03 NOTE — ED Triage Notes (Signed)
Pt brought in by EMS from urgent care due to HR 125. EKG shows afib rvr. Pt has no complaints

## 2019-07-03 NOTE — H&P (Signed)
History and Physical    PLEASE NOTE THAT DRAGON DICTATION SOFTWARE WAS USED IN THE CONSTRUCTION OF THIS NOTE.   [name] [id number] DOB: 17-Jul-1926 DOA: 07/03/2019  PCP: Hayley Helper, MD Patient coming from: Home  I have personally briefly reviewed patient's old medical records in [place]  Chief Complaint: Elevated heart rate  HPI: [name] is a 85 y.o. female with medical history significant for paroxysmal atrial fibrillation complicated by sick sinus syndrome status post pacemaker placement in 2011, chronic anticoagulation on warfarin, acquired hypothyroidism, hypertension, obstructive sleep apnea on 2 L O2 on QHS basis, who is admitted to [facility] on 07/03/2019 with atrial fibrillation with rapid ventricular response after presenting from home to [facility] emergency department for evaluation of elevated heart rate.  Earlier today, at the time of her physical therapy appointment with home health, the home health nurse reportedly noted the patient to be tachycardic, reporting heart rates into the 120s to 130s.  The patient reports that she has felt very mild, generalized, nonfocal weakness over the last day, but otherwise denies any new symptoms.  Specifically, she denies any associated chest pain, shortness of breath, palpitations, diaphoresis, nausea, vomiting, dizziness, presyncope.  She also denies any recent orthopnea, PND, or peripheral edema.  Denies subjective fever, chills, rigors, or generalized myalgias.  She also denies any recent cough, shortness of breath, or any known Covid positive contacts.  Denies any associated abdominal pain, diarrhea, dysuria, gross hematuria, or change in urinary urgency/frequency.  The patient reports that she has been on the same dose of Lopressor 100 mg p.o. twice daily for several years, and reports outstanding compliance in taking this medication.  Overall, the patient reports that she was very surprised to hear  that her heart rate was elevated as she reports that she feels that she has been at her baseline level of health without any new symptoms.  Upon finding the patient's heart rate to be elevated today, home health nurse made arrangements for the patient to be seen at urgent care, who confirmed A. fib with RVR, prompting the patient to be transported from urgent care to [facility] emergency department via EMS for further evaluation and management of atrial fibrillation with RVR.  Of note, per my chart review, I have not encountered a previous echocardiogram on file.    The patient reports that, on June 10, 2019, that she experienced a ground-level mechanical fall while attempting to walk into her house.  Specifically, she reports that she tripped while attempting to step up into the house, at which time her head moved forward striking the wood door frame.  She denies any associated loss of consciousness, reports that she had mild bruising over the left temple for a few weeks following this incident.  She denies any ensuing dizziness, change in vision, or headache.  In the setting of presenting acute hyponatremia, the patient also reports that she is been on the same dose of hydrochlorothiazide for several years now.    ED Course: Vital signs in the emergency department were notable for the following: Temperature max 98; initial ventricular rate noted to be in the 120s; blood pressure 130/74-130 8/92; respiratory rate 15-22, and oxygen saturation 95 to 99% on room air.  Labs in the ED were notable for the following: BMP notable for sodium 125 relative to most recent prior value 133 on 05/15/2019, potassium 4.2, chloride 89, BUN 18, creatinine 0.96, and BUN to creatinine ratio of 18.75.  BMP noted to be 232, without any prior value available for point comparison.  High-sensitivity troponins I x2 were both found to be nonelevated at 5.  CBC notable for white blood cell count of 10,000; INR 2.7.  EKG showed  atrial fibrillation with ventricular rate 118 as well as T wave flattening in the inferior leads as well as T wave inversion in V1 through V4, in the absence of any evidence of ST changes, including no evidence of ST elevation.  The aforementioned T wave changes appear to be new relative to most recent prior EKG from January 2019.  Chest x-ray showed cardiomegaly with mild pulmonary vascular congestion without evidence of infiltrate, pulmonary edema, or effusion.  In anticipation of admission, routine screening COVID-19 nasopharyngeal swab was obtained, with result of such pending at this time.   While still in the ED, the patient was started on diltiazem drip in the absence of preceding bolus.  Subsequently, the patient was admitted to the stepdown unit for further evaluation management of presenting atrial fibrillation with RVR.   Review of Systems: As per HPI otherwise 10 point review of systems negative.   Past Medical History:  Diagnosis Date  . Arthritis   . Gastroesophageal reflux disease   . Hearing impairment   . Hyperlipidemia   . Hypertension   . Hypothyroidism   . Impaired glucose tolerance   . Macular degeneration   . Pancreatitis   . Paroxysmal atrial fibrillation ([name]) 2011  . Sick sinus syndrome ([place]) 2011   Atrial fibrilation 10/2009; and dual chamber pacemaker in 4/11; normal EF  . Sleep apnea    Nocturnal oxygen therapy  . Zoster 2016    Past Surgical History:  Procedure Laterality Date  . ABDOMINAL HYSTERECTOMY    . CATARACT EXTRACTION     Bilateral  . COLONOSCOPY  2009   Dr. Gala Romney  . EYE SURGERY     laser   . PACEMAKER INSERTION  2011   dual chamber   . PPM GENERATOR CHANGEOUT N/A 11/13/2016   Procedure: PPM Generator Changeout;  Surgeon: Evans Lance, MD;  Location: [place] [facility];  Service: Cardiovascular;  Laterality: N/A;  . SALPINGOOPHORECTOMY  1970   right    Social History:  reports that she has never smoked. She has never used smokeless  tobacco. She reports that she does not drink alcohol or use drugs.   No Known Allergies  Family History  Problem Relation Age of Onset  . Cancer Mother        gential  . Cancer Father        throat   . Pneumonia Sister   . Emphysema Sister   . Mitral valve prolapse Sister   . Heart disease Other   . Arthritis Other   . Lung disease Other   . Asthma Other   . Melanoma Son      Prior to Admission medications   Medication Sig Start Date End Date Taking? Authorizing Provider  hydrochlorothiazide (HYDRODIURIL) 25 MG tablet TAKE 1 TABLET BY MOUTH ONCE DAILY. Patient taking differently: Take 25 mg by mouth daily.  06/27/19  Yes Arnoldo Lenis, MD  levothyroxine (SYNTHROID) 112 MCG tablet TAKE 1 TABLET BY MOUTH ONCE DAILY. Patient taking differently: Take 112 mcg by mouth daily before breakfast.  04/02/19  Yes Hayley Helper, MD  lovastatin (MEVACOR) 40 MG tablet TAKE ONE TABLET BY MOUTH DAILY. Patient taking differently: Take 40 mg by mouth at bedtime.  12/23/18  Yes  Arnoldo Lenis, MD  metoprolol tartrate (LOPRESSOR) 100 MG tablet TAKE 1 TABLET BY MOUTH TWICE A DAY. Patient taking differently: Take 100 mg by mouth 2 (two) times daily.  09/09/18  Yes Branch, Alphonse Guild, MD  omeprazole (PRILOSEC) 40 MG capsule TAKE (1) CAPSULE BY MOUTH ONCE DAILY FOR ACID REFLUX. Patient taking differently: Take 40 mg by mouth daily.  06/09/19  Yes Hayley Helper, MD  warfarin (COUMADIN) 5 MG tablet TAKE 1/2 TABLET BY MOUTH DAILY EXCEPT 1 TABLET ON WEDNESDAYS & SATURDAYS OR AS DIRECTED. 05/26/19  Yes Branch, Alphonse Guild, MD  azithromycin (ZITHROMAX) 250 MG tablet Take 250-500 mg by mouth See admin instructions. Take two tablet on day 1 then take one tablet on days 2 through 5 starting on 06/27/2019    [provider]  Calcium Carb-Cholecalciferol 500-400 MG-UNIT CHEW One tablet twice daily 10/07/17   Hayley Helper, MD  meclizine (ANTIVERT) 12.5 MG tablet Take 1 tablet (12.5 mg  total) by mouth 2 (two) times daily as needed for dizziness. 05/15/19   Hayley Helper, MD  OXYGEN-HELIUM IN Inhale 2 L into the lungs at bedtime.    [provider]  Potassium 99 MG TABS Take 1 tablet by mouth daily.    [provider]  benzonatate (TESSALON PERLES) 100 MG capsule Take 1 capsule (100 mg total) by mouth every 6 (six) hours as needed for cough. 02/05/13 11/04/18  Hayley Helper, MD     Objective    Physical Exam: Vitals:   07/03/19 1637 07/03/19 1640 07/03/19 1836 07/03/19 1840  BP:  (!) 142/99    Pulse:  (!) 118    Resp:   (!) 24 (!) 25  Temp:  98 F (36.7 C)    TempSrc:  Oral    SpO2:  99% 98% 98%  Weight: 71 kg       General: appears to be stated age; alert, oriented Skin: warm, dry, no rash Head:  AT/[place] Eyes:  PEARL b/l, EOMI Mouth:  Oral mucosa membranes appear moist, normal dentition Neck: supple; trachea midline Heart:  Irregular, now rated controlled; did not appreciate any M/R/G Lungs: CTAB, did not appreciate any wheezes, rales, or rhonchi Abdomen: + BS; soft, ND, NT Vascular: 2+ pedal pulses b/l; 2+ radial pulses b/l Extremities: no peripheral edema, no muscle wasting Neuro: strength and sensation intact in upper and lower extremities b/l   Labs on Admission: I have personally reviewed following labs and imaging studies  CBC: Recent Labs  Lab 07/03/19 1657  WBC 10.0  NEUTROABS 7.2  HGB 11.4*  HCT 33.7*  MCV 88.5  PLT [id number]   Basic Metabolic Panel: Recent Labs  Lab 07/03/19 1657  NA 125*  K 4.2  CL 89*  CO2 25  GLUCOSE 110*  BUN 18  CREATININE 0.96  CALCIUM 9.4   GFR: Estimated Creatinine Clearance: 35.7 mL/min (by C-G formula based on SCr of 0.96 mg/dL). Liver Function Tests: No results for input(s): AST, ALT, ALKPHOS, BILITOT, PROT, ALBUMIN in the last 168 hours. No results for input(s): LIPASE, AMYLASE in the last 168 hours. No results for input(s): AMMONIA in the last 168 hours. Coagulation  Profile: Recent Labs  Lab 07/03/19 1701  INR 2.7*   Cardiac Enzymes: No results for input(s): CKTOTAL, CKMB, CKMBINDEX, TROPONINI in the last 168 hours. BNP (last 3 results) No results for input(s): PROBNP in the last 8760 hours. HbA1C: No results for input(s): HGBA1C in the last 72 hours. CBG: No results  for input(s): GLUCAP in the last 168 hours. Lipid Profile: No results for input(s): CHOL, HDL, LDLCALC, TRIG, CHOLHDL, LDLDIRECT in the last 72 hours. Thyroid Function Tests: No results for input(s): TSH, T4TOTAL, FREET4, T3FREE, THYROIDAB in the last 72 hours. Anemia Panel: No results for input(s): VITAMINB12, FOLATE, FERRITIN, TIBC, IRON, RETICCTPCT in the last 72 hours. Urine analysis:    Component Value Date/Time   COLORURINE YELLOW 02/09/2015 [place] 02/09/2015 1715   LABSPEC 1.010 02/09/2015 1715   PHURINE 6.5 02/09/2015 1715   GLUCOSEU NEGATIVE 02/09/2015 1715   HGBUR LARGE (A) 02/09/2015 1715   HGBUR trace-lysed 01/11/2010 1458   BILIRUBINUR neg 07/26/2017 1120   KETONESUR NEGATIVE 02/09/2015 1715   PROTEINUR 100 07/26/2017 1120   PROTEINUR NEGATIVE 02/09/2015 1715   UROBILINOGEN 1.0 07/26/2017 1120   UROBILINOGEN 0.2 02/09/2015 1715   NITRITE neg 07/26/2017 1120   NITRITE NEGATIVE 02/09/2015 1715   LEUKOCYTESUR Moderate (2+) (A) 07/26/2017 1120    Radiological Exams on Admission: Dg Chest Port 1 View  Result Date: 07/03/2019 CLINICAL DATA:  Weakness and pain. EXAM: PORTABLE CHEST 1 VIEW COMPARISON:  12/20/2016 FINDINGS: Cardiomegaly and LEFT-sided pacemaker again noted. Mild pulmonary vascular congestion identified. There is no evidence of focal airspace disease, pulmonary edema, suspicious pulmonary nodule/mass, pleural effusion, or pneumothorax. No acute bony abnormalities are identified. IMPRESSION: Cardiomegaly with mild pulmonary vascular congestion. Electronically Signed   By: Margarette Canada M.D.   On: 07/03/2019 18:08    EKG:  Independently reviewed, with results as described above.    Assessment/Plan   HALLI NEIGHBOURS is a 85 y.o. female with medical history significant for paroxysmal atrial fibrillation complicated by sick sinus syndrome status post pacemaker placement in 2011, chronic anticoagulation on warfarin, acquired hypothyroidism, hypertension, obstructive sleep apnea on 2 L O2 on QHS basis, who is admitted to [facility] on 07/03/2019 with atrial fibrillation with rapid ventricular response after presenting from home to [facility] emergency department for evaluation of elevated heart rate.   Principal Problem:   Atrial fibrillation with RVR (HCC) Active Problems:   Hypothyroidism   Essential hypertension   Chronic anticoagulation   Acute hyponatremia   Generalized weakness   #) Atrial fibrillation with RVR: In the context of a known history of paroxysmal atrial fibrillation complicated by sick sinus syndrome status post pacemaker placement in 2011 followed by replacement of this original pacemaker in 2018, the patient presents with atrial fibrillation with RVR and initial ventricular rates in the 120s.  With the exception of mild generalized weakness over the last day, she does not appear to be symptomatic in the setting of RVR.  Underlying etiology for episode of RVR is not entirely clear at this time, as the patient reports outstanding compliance with her long-term AV nodal blocking agent in the form of Lopressor 100 mg p.o. twice daily.  No evidence of underlying infectious process.  Presenting chest x-ray is associated with cardiomegaly and mild pulmonary vascular congestion, which may be contributing to her RVR, but may also be a consequence of her rapid ventricular response due to the associated diminished diastolic filling time associated with this.  However, in the context of nonspecific T wave findings on presenting EKG, could consider obtaining echocardiogram following improve rate control,  particularly given that I have been unable to locate any prior echocardiogram results for this patient.  Rate control improving on diltiazem drip initiated in the ED, and blood pressures have tolerated all of the above, without any  evidence of associated hypotension.  Of note, high-sensitivity troponin I x2 have been found to be nonelevated.  She is chronically anticoagulated on warfarin, with presenting INR found to be therapeutic at 2.7.  Plan: Continue diltiazem drip overnight, with goal heart rate of less than 100.  I have ordered resumption of home Lopressor, with first dose to occur in the morning.  Anticipate that diltiazem drip will be continued for 1 to 2 hours following administration of the oral Lopressor, with plan to subsequently discontinue this drip if rate control remains adequate at that time.  Consider obtaining echocardiogram, as further described above.  Monitor on telemetry.  Monitor strict I's and O's.  I placed an inpatient pharmacy consult for assistance with warfarin dosing during this hospitalization.  We will add on serum magnesium level to labs already collected in the ED.  In the setting of a documented history of acquired hypothyroidism, also check TSH.  Check urinalysis.     #) Acute hypoosmolar hyponatremia: Serum sodium in the ED today found to be 125, which is new for the patient, with most recent prior serum sodium level noted to be 133 on 05/15/2019.  There may be an element of dehydration given concomitant hypochloremia in the context of outpatient HCTZ, although the patient reports that she has been on this thiazide diuretic for several years now, without any recent dose adjustments.  Other possibilities include a mild SIADH response prompted by increasing pulmonary vascular congestion due to relative diminished cardiac output in the setting of atrial fibrillation with RVR.  Differential also includes cerebral salt wasting syndrome, given that the patient fell and hit her  head on June 10, 2019, representing an interval occurrence relative to most recent prior serum sodium value of 133.  Overall, the patient appears relatively euvolemic at this time, and I will initiate SIADH work-up to further guide subsequent approach to fluids.   Plan: Check urinalysis along with random urine sodium and urine osmolality.  We will hold home HCTZ for now.  Work-up and management of presenting atrial fibrillation with RVR, as above.  In the setting of history of acquired hypothyroidism on Synthroid as an outpatient, will check TSH.  Monitor strict I's and O's and daily weights.  Repeat BMP in the morning.    #) Generalized weakness: the patient reports mild generalized, nonfocal weakness over the last day or so.  Potential contributing factors include presenting atrial fibrillation with RVR as well has acute hyponatremia, as described above.  No objective findings of acute focal neurologic deficits on physical exam this evening.  Of note, the patient already has home health physical therapy set up for her, as described above.  Plan: Work-up and management of A. fib with RVR as well as acute hyponatremia, as above.  Check TSH as well as urinalysis.      #) Acquired hypothyroidism: On Synthroid as an outpatient.  Plan: We will continue home dose of Synthroid for now, and check TSH at this time.     #) Allergic rhinitis: On scheduled intranasal Flonase as an outpatient.  No evidence of recent exacerbation or associated asthma.   Plan: Continue home Flonase.      #) Obstructive sleep apnea: The patient reports that she does not use CPAP as an outpatient, but rather wears 2 L nasal cannula on a nightly basis.  Plan: I placed an order for the patient to be placed on 2 L nasal cannula overnight.      DVT prophylaxis: Chronically anticoagulated  on warfarin, with presenting INR noted to be therapeutic. Code Status: Full code Family Communication: None Disposition Plan:   Per Rounding Team Consults called: None Admission status: Observation; stepdown unit.    PLEASE NOTE THAT DRAGON DICTATION SOFTWARE WAS USED IN THE CONSTRUCTION OF THIS NOTE.   [facility] Pager [phone] From 3PM- 11PM.   Otherwise, please contact night-coverage  [email or web address] Password [facility]  07/03/2019, 6:58 PM

## 2019-07-03 NOTE — Telephone Encounter (Signed)
Afib 110 on ekg, fairly mild tachycardia. Given her constellation of symptoms I think ER evaluation is indicated. Can get a full workup, afib alone at 110 I don't think would explain her symptoms unless she has much faster rates in the ER.    Zandra Abts MD

## 2019-07-03 NOTE — Discharge Instructions (Signed)
Recommending further evaluation and management in the ED for atrial fibrillation for RVR.  Spoke to cardiology office and they recommended further evaluaiton n the ED as well.  Patient and family aware and in agreement with this plan.  Will go by private vehicle to [facility] ED.

## 2019-07-03 NOTE — ED Triage Notes (Signed)
Pt presents with c/o tachycardia. Physical therapist made  home visit today  And discovered to have pulse of 125. Pt has pacemaker and has had frequent falls over the past 2 months. Pt also has c/o belching but no c/o chest pain

## 2019-07-04 ENCOUNTER — Observation Stay (HOSPITAL_COMMUNITY): Payer: Medicare Other

## 2019-07-04 ENCOUNTER — Encounter (HOSPITAL_COMMUNITY): Payer: Self-pay

## 2019-07-04 ENCOUNTER — Ambulatory Visit: Payer: Medicare Other | Admitting: Family Medicine

## 2019-07-04 ENCOUNTER — Telehealth: Payer: Self-pay

## 2019-07-04 DIAGNOSIS — E039 Hypothyroidism, unspecified: Secondary | ICD-10-CM | POA: Diagnosis present

## 2019-07-04 DIAGNOSIS — E876 Hypokalemia: Secondary | ICD-10-CM | POA: Diagnosis not present

## 2019-07-04 DIAGNOSIS — I4891 Unspecified atrial fibrillation: Secondary | ICD-10-CM | POA: Diagnosis not present

## 2019-07-04 DIAGNOSIS — Z9981 Dependence on supplemental oxygen: Secondary | ICD-10-CM | POA: Diagnosis not present

## 2019-07-04 DIAGNOSIS — G4733 Obstructive sleep apnea (adult) (pediatric): Secondary | ICD-10-CM | POA: Diagnosis present

## 2019-07-04 DIAGNOSIS — Z79890 Hormone replacement therapy (postmenopausal): Secondary | ICD-10-CM | POA: Diagnosis not present

## 2019-07-04 DIAGNOSIS — Z79899 Other long term (current) drug therapy: Secondary | ICD-10-CM | POA: Diagnosis not present

## 2019-07-04 DIAGNOSIS — R0902 Hypoxemia: Secondary | ICD-10-CM | POA: Diagnosis not present

## 2019-07-04 DIAGNOSIS — E871 Hypo-osmolality and hyponatremia: Secondary | ICD-10-CM | POA: Diagnosis present

## 2019-07-04 DIAGNOSIS — Z950 Presence of cardiac pacemaker: Secondary | ICD-10-CM | POA: Diagnosis not present

## 2019-07-04 DIAGNOSIS — H9190 Unspecified hearing loss, unspecified ear: Secondary | ICD-10-CM | POA: Diagnosis present

## 2019-07-04 DIAGNOSIS — I480 Paroxysmal atrial fibrillation: Secondary | ICD-10-CM | POA: Diagnosis present

## 2019-07-04 DIAGNOSIS — J309 Allergic rhinitis, unspecified: Secondary | ICD-10-CM | POA: Diagnosis present

## 2019-07-04 DIAGNOSIS — R531 Weakness: Secondary | ICD-10-CM

## 2019-07-04 DIAGNOSIS — Z9181 History of falling: Secondary | ICD-10-CM | POA: Diagnosis not present

## 2019-07-04 DIAGNOSIS — K219 Gastro-esophageal reflux disease without esophagitis: Secondary | ICD-10-CM | POA: Diagnosis present

## 2019-07-04 DIAGNOSIS — Z20828 Contact with and (suspected) exposure to other viral communicable diseases: Secondary | ICD-10-CM | POA: Diagnosis present

## 2019-07-04 DIAGNOSIS — I495 Sick sinus syndrome: Secondary | ICD-10-CM | POA: Diagnosis present

## 2019-07-04 DIAGNOSIS — Z7901 Long term (current) use of anticoagulants: Secondary | ICD-10-CM | POA: Diagnosis not present

## 2019-07-04 DIAGNOSIS — E785 Hyperlipidemia, unspecified: Secondary | ICD-10-CM | POA: Diagnosis present

## 2019-07-04 DIAGNOSIS — K30 Functional dyspepsia: Secondary | ICD-10-CM | POA: Diagnosis present

## 2019-07-04 DIAGNOSIS — H3530 Unspecified macular degeneration: Secondary | ICD-10-CM | POA: Diagnosis present

## 2019-07-04 DIAGNOSIS — I10 Essential (primary) hypertension: Secondary | ICD-10-CM | POA: Diagnosis present

## 2019-07-04 LAB — CBC
HCT: 34.4 % — ABNORMAL LOW (ref 36.0–46.0)
HCT: 35 % — ABNORMAL LOW (ref 36.0–46.0)
Hemoglobin: 11.7 g/dL — ABNORMAL LOW (ref 12.0–15.0)
Hemoglobin: 11.8 g/dL — ABNORMAL LOW (ref 12.0–15.0)
MCH: 29.8 pg (ref 26.0–34.0)
MCH: 29.8 pg (ref 26.0–34.0)
MCHC: 34 g/dL (ref 30.0–36.0)
MCHC: 33.7 g/dL (ref 30.0–36.0)
MCV: 87.8 fL (ref 80.0–100.0)
MCV: 88.4 fL (ref 80.0–100.0)
Platelets: 299 10*3/uL (ref 150–400)
Platelets: 293 10*3/uL (ref 150–400)
RBC: 3.92 MIL/uL (ref 3.87–5.11)
RBC: 3.96 MIL/uL (ref 3.87–5.11)
RDW: 12.6 % (ref 11.5–15.5)
RDW: 12.6 % (ref 11.5–15.5)
WBC: 10.7 10*3/uL — ABNORMAL HIGH (ref 4.0–10.5)
WBC: 8.7 10*3/uL (ref 4.0–10.5)
nRBC: 0 % (ref 0.0–0.2)
nRBC: 0 % (ref 0.0–0.2)

## 2019-07-04 LAB — BASIC METABOLIC PANEL
Anion gap: 13 (ref 5–15)
BUN: 17 mg/dL (ref 8–23)
CO2: 26 mmol/L (ref 22–32)
Calcium: 9.8 mg/dL (ref 8.9–10.3)
Chloride: 89 mmol/L — ABNORMAL LOW (ref 98–111)
Creatinine, Ser: 0.86 mg/dL (ref 0.44–1.00)
GFR calc Af Amer: >60 mL/min (ref >60)
GFR calc non Af Amer: 59 mL/min — ABNORMAL LOW (ref >60)
Glucose, Bld: 106 mg/dL — ABNORMAL HIGH (ref 70–99)
Potassium: 3.3 mmol/L — ABNORMAL LOW (ref 3.5–5.1)
Sodium: 128 mmol/L — ABNORMAL LOW (ref 135–145)

## 2019-07-04 LAB — URINALYSIS, ROUTINE W REFLEX MICROSCOPIC
Bacteria, UA: NONE SEEN
Bilirubin Urine: NEGATIVE
Glucose, UA: NEGATIVE mg/dL
Ketones, ur: NEGATIVE mg/dL
Nitrite: NEGATIVE
Protein, ur: NEGATIVE mg/dL
Specific Gravity, Urine: 1.005 (ref 1.005–1.030)
pH: 6 (ref 5.0–8.0)

## 2019-07-04 LAB — TSH: TSH: 2.855 u[IU]/mL (ref 0.350–4.500)

## 2019-07-04 LAB — OSMOLALITY, URINE: Osmolality, Ur: 219 mOsm/kg — ABNORMAL LOW (ref 300–900)

## 2019-07-04 LAB — ECHOCARDIOGRAM COMPLETE: Weight: 2504.43 oz

## 2019-07-04 LAB — SARS CORONAVIRUS 2 (TAT 6-24 HRS): SARS Coronavirus 2: NEGATIVE

## 2019-07-04 LAB — MAGNESIUM: Magnesium: 1.7 mg/dL (ref 1.7–2.4)

## 2019-07-04 LAB — SODIUM, URINE, RANDOM: Sodium, Ur: 62 mmol/L

## 2019-07-04 LAB — TROPONIN I (HIGH SENSITIVITY): Troponin I (High Sensitivity): 6 ng/L (ref <18)

## 2019-07-04 MED ORDER — ACETAMINOPHEN 325 MG PO TABS
650.0000 mg | ORAL_TABLET | Freq: Once | ORAL | Status: AC
  Administered 2019-07-04: 650 mg via ORAL
  Filled 2019-07-04: qty 2

## 2019-07-04 MED ORDER — DILTIAZEM HCL 30 MG PO TABS
30.0000 mg | ORAL_TABLET | Freq: Four times a day (QID) | ORAL | Status: DC
  Administered 2019-07-04 – 2019-07-05 (×5): 30 mg via ORAL
  Filled 2019-07-04 (×5): qty 1

## 2019-07-04 MED ORDER — PRAVASTATIN SODIUM 40 MG PO TABS
40.0000 mg | ORAL_TABLET | Freq: Every day | ORAL | Status: DC
  Administered 2019-07-04 (×2): 40 mg via ORAL
  Filled 2019-07-04 (×5): qty 1
  Filled 2019-07-04: qty 2
  Filled 2019-07-04: qty 1

## 2019-07-04 MED ORDER — SODIUM CHLORIDE 0.9 % IV SOLN
INTRAVENOUS | Status: AC
  Administered 2019-07-04: 16:00:00 via INTRAVENOUS

## 2019-07-04 MED ORDER — POTASSIUM CHLORIDE CRYS ER 20 MEQ PO TBCR
40.0000 meq | EXTENDED_RELEASE_TABLET | Freq: Two times a day (BID) | ORAL | Status: AC
  Administered 2019-07-04 (×2): 40 meq via ORAL
  Filled 2019-07-04 (×2): qty 2

## 2019-07-04 MED ORDER — LEVOTHYROXINE SODIUM 112 MCG PO TABS
ORAL_TABLET | ORAL | Status: AC
  Filled 2019-07-04: qty 1

## 2019-07-04 MED ORDER — WARFARIN SODIUM 2.5 MG PO TABS
2.5000 mg | ORAL_TABLET | Freq: Once | ORAL | Status: AC
  Administered 2019-07-04: 17:00:00 2.5 mg via ORAL
  Filled 2019-07-04 (×2): qty 1

## 2019-07-04 MED ORDER — DILTIAZEM HCL 25 MG/5ML IV SOLN
10.0000 mg | Freq: Once | INTRAVENOUS | Status: AC
  Administered 2019-07-04: 10 mg via INTRAVENOUS
  Filled 2019-07-04: qty 5

## 2019-07-04 NOTE — Progress Notes (Signed)
*  PRELIMINARY RESULTS* Echocardiogram 2D Echocardiogram has been performed.  [name] 07/04/2019, 12:36 PM

## 2019-07-04 NOTE — Evaluation (Signed)
Physical Therapy Evaluation Patient Details Name: [name] MRN: [id number] DOB: Jun 23, 1926 Today's Date: 07/04/2019   History of Present Illness  [name] is a 85 y.o. female with medical history significant for paroxysmal atrial fibrillation complicated by sick sinus syndrome status post pacemaker placement in 2011, chronic anticoagulation on warfarin, acquired hypothyroidism, hypertension, obstructive sleep apnea on 2 L O2 on QHS basis, who is admitted to [facility] on 07/03/2019 with atrial fibrillation with rapid ventricular response after presenting from home to [facility] emergency department for evaluation of elevated heart rate.    Clinical Impression  Patient functioning near baseline for functional mobility and gait, limited to ambulation in room due to c/o fatigue, no loss of balance, had difficulty with stand to sitting in chair secondary to dropping into chair, demonstrated improvement after verbal cues and tolerated sitting up in chair after therapy.  Patient will benefit from continued physical therapy in hospital and recommended venue below to increase strength, balance, endurance for safe ADLs and gait.     Follow Up Recommendations Home health PT    Equipment Recommendations  None recommended by PT    Recommendations for Other Services       Precautions / Restrictions Precautions Precautions: Fall Restrictions Weight Bearing Restrictions: No      Mobility  Bed Mobility Overal bed mobility: Modified Independent             General bed mobility comments: slightly increased time  Transfers Overall transfer level: Needs assistance Equipment used: Rolling walker (2 wheeled) Transfers: Sit to/from [employer] Transfers Sit to Stand: Supervision;Min guard Stand pivot transfers: Supervision;Min guard       General transfer comment: has diffiuclty with stand to sits due to dropping into chair, demonstrates improvement after verbal  cueing  Ambulation/Gait Ambulation/Gait assistance: Supervision;Min guard Gait Distance (Feet): 20 Feet Assistive device: Rolling walker (2 wheeled) Gait Pattern/deviations: Decreased step length - right;Decreased step length - left;Decreased stride length Gait velocity: decreased   General Gait Details: demonstrates slightly labored cadence with hiking of left hip due to weak left dorsiflexors, limited secondary to c/o fatigue  Stairs            Wheelchair Mobility    Modified Rankin (Stroke Patients Only)       Balance Overall balance assessment: Needs assistance Sitting-balance support: Feet supported;No upper extremity supported Sitting balance-Leahy Scale: Good Sitting balance - Comments: demonstrates good return for maintaining sitting balance putting on shoes while seated at bedside   Standing balance support: During functional activity;Bilateral upper extremity supported Standing balance-Leahy Scale: Fair Standing balance comment: using RW                             Pertinent Vitals/Pain Pain Assessment: No/denies pain    Home Living Family/patient expects to be discharged to:: Private residence Living Arrangements: Children Available Help at Discharge: Family;Available 24 hours/day Type of Home: House Home Access: Stairs to enter Entrance Stairs-Rails: Right;Left(to wide to reach both) Entrance Stairs-Number of Steps: 1 Home Layout: One level Home Equipment: Walker - 2 wheels;Cane - single point;Bedside commode      Prior Function Level of Independence: Needs assistance   Gait / Transfers Assistance Needed: household ambulator with RW or SPC  ADL's / Homemaking Assistance Needed: assisted by family        Hand Dominance        Extremity/Trunk Assessment   Upper Extremity Assessment Upper Extremity  Assessment: Generalized weakness    Lower Extremity Assessment Lower Extremity Assessment: Generalized weakness;LLE  deficits/detail LLE Deficits / Details: grossly 4+/5 except left ankle dorsiflexors 2/5, can't clear toes when taking steps,has to hike hip    Cervical / Trunk Assessment Cervical / Trunk Assessment: Kyphotic  Communication   Communication: No difficulties  Cognition Arousal/Alertness: Awake/alert Behavior During Therapy: WFL for tasks assessed/performed Overall Cognitive Status: Within Functional Limits for tasks assessed                                        General Comments      Exercises     Assessment/Plan    PT Assessment Patient needs continued PT services  PT Problem List Decreased strength;Decreased activity tolerance;Decreased balance;Decreased mobility       PT Treatment Interventions Gait training;Stair training;Functional mobility training;Therapeutic activities;Therapeutic exercise;Patient/family education    PT Goals (Current goals can be found in the Care Plan section)  Acute Rehab PT Goals Patient Stated Goal: return home with family to assist PT Goal Formulation: With patient Time For Goal Achievement: 07/07/19 Potential to Achieve Goals: Good    Frequency Min 3X/week   Barriers to discharge        Co-evaluation               AM-PAC PT "6 Clicks" Mobility  Outcome Measure Help needed turning from your back to your side while in a flat bed without using bedrails?: None Help needed moving from lying on your back to sitting on the side of a flat bed without using bedrails?: None Help needed moving to and from a bed to a chair (including a wheelchair)?: A Little Help needed standing up from a chair using your arms (e.g., wheelchair or bedside chair)?: A Little Help needed to walk in hospital room?: A Little Help needed climbing 3-5 steps with a railing? : A Little 6 Click Score: 20    End of Session   Activity Tolerance: Patient tolerated treatment well;Patient limited by fatigue Patient left: in chair;with call bell/phone  within reach;with chair alarm set Nurse Communication: Mobility status PT Visit Diagnosis: Unsteadiness on feet (R26.81);Other abnormalities of gait and mobility (R26.89);Muscle weakness (generalized) (M62.81)    Time: [id number] PT Time Calculation (min) (ACUTE ONLY): 29 min   Charges:   PT Evaluation $PT Eval Moderate Complexity: 1 Mod PT Treatments $Therapeutic Activity: 23-37 mins        3:57 PM, 07/04/19 Lonell Grandchild, MPT Physical Therapist with [facility] [phone] office [phone] mobile phone

## 2019-07-04 NOTE — Telephone Encounter (Signed)
Pt's daughter called in asking if her mother's cardiologist will see her today. She informed me she was still in the ED as there were no available beds for her upstairs. I informed her that Dr.McDowell was the hospital rounder today and "if" they put in for a consult, she would be seen by the cardiologist. I advised her that she should call over to the ED and speak with the charge nurse handling  her mother's care. She stated she will call there.

## 2019-07-04 NOTE — Progress Notes (Signed)
PROGRESS NOTE    [name]  [id number] DOB: May 22, 1926 DOA: 07/03/2019 PCP: Fayrene Helper, MD   Brief Narrative:  Per HPI: [name] is a 85 y.o. female with medical history significant for paroxysmal atrial fibrillation complicated by sick sinus syndrome status post pacemaker placement in 2011, chronic anticoagulation on warfarin, acquired hypothyroidism, hypertension, obstructive sleep apnea on 2 L O2 on QHS basis, who is admitted to [facility] on 07/03/2019 with atrial fibrillation with rapid ventricular response after presenting from home to [facility] emergency department for evaluation of elevated heart rate.  Earlier today, at the time of her physical therapy appointment with home health, the home health nurse reportedly noted the patient to be tachycardic, reporting heart rates into the 120s to 130s.  The patient reports that she has felt very mild, generalized, nonfocal weakness over the last day, but otherwise denies any new symptoms.  Specifically, she denies any associated chest pain, shortness of breath, palpitations, diaphoresis, nausea, vomiting, dizziness, presyncope.  She also denies any recent orthopnea, PND, or peripheral edema.  Denies subjective fever, chills, rigors, or generalized myalgias.  She also denies any recent cough, shortness of breath, or any known Covid positive contacts.  Denies any associated abdominal pain, diarrhea, dysuria, gross hematuria, or change in urinary urgency/frequency.  The patient reports that she has been on the same dose of Lopressor 100 mg p.o. twice daily for several years, and reports outstanding compliance in taking this medication.  Overall, the patient reports that she was very surprised to hear that her heart rate was elevated as she reports that she feels that she has been at her baseline level of health without any new symptoms.  Upon finding the patient's heart rate to be elevated today, home health nurse made  arrangements for the patient to be seen at urgent care, who confirmed A. fib with RVR, prompting the patient to be transported from urgent care to [facility] emergency department via EMS for further evaluation and management of atrial fibrillation with RVR.  Of note, per my chart review, I have not encountered a previous echocardiogram on file.    The patient reports that, on June 10, 2019, that she experienced a ground-level mechanical fall while attempting to walk into her house.  Specifically, she reports that she tripped while attempting to step up into the house, at which time her head moved forward striking the wood door frame.  She denies any associated loss of consciousness, reports that she had mild bruising over the left temple for a few weeks following this incident.  She denies any ensuing dizziness, change in vision, or headache.  In the setting of presenting acute hyponatremia, the patient also reports that she is been on the same dose of hydrochlorothiazide for several years now.  11/6: Patient was admitted for tachycardia that was noticed by her home health nurse.  No symptoms otherwise noted.  She has been having some weakness however and is noted to have hyponatremia in the setting of HCTZ use.  She is now weaned off Cardizem IV drip and has been transitioned to oral Cardizem with stable heart rate control noted.  2D echocardiogram without any acute abnormalities and TSH within normal limits.  Continue to monitor on telemetry with oral Cardizem to ensure stability.  Assessment & Plan:   Principal Problem:   Atrial fibrillation with RVR (HCC) Active Problems:   Hypothyroidism   Essential hypertension   Chronic anticoagulation   Acute hyponatremia  Generalized weakness   Atrial fibrillation with RVR in the setting of paroxysmal atrial fibrillation -This is complicated by sick sinus syndrome status post pacemaker placement in 2011 with replacement in 2018 -Cardizem drip  discontinued and patient can be admitted to telemetry with oral Cardizem as ordered -We will order IV pushes as needed for significant tachycardia -TSH within normal limits at 2.855 -2D echocardiogram with LVEF 65-70% which is similar when compared to prior in 2011 -Continue warfarin for anticoagulation monitor INR  Generalized weakness with acute hyperosmolar hyponatremia -Likely related along with factor of atrial fibrillation with RVR -TSH within normal limits -Hold home HCTZ which is the likely culprit -This is improving at the moment and will continue to follow while off HCTZ -Gentle, time-limited normal saline for now and will recheck labs in a.m. -No signs of SIADH noted for now -Fall precautions and PT evaluation ordered  Hypothyroidism -Continue Synthroid with TSH within normal limits  Allergic rhinitis -Flonase  OSA -2 L nasal cannula oxygen at night  DVT prophylaxis: Chronically on warfarin with therapeutic INR Code Status: Full code Family Communication: None at bedside Disposition Plan: Monitor on telemetry with oral Cardizem as ordered.  2D echocardiogram within normal limits.  PT evaluation.  Monitor sodium levels.  Anticipate discharge in the next 1 to 2 days if stable.   Consultants:   None  Procedures:   None  Antimicrobials:   None   Subjective: Patient seen and evaluated today with no new acute complaints or concerns. No acute concerns or events noted overnight.  Heart rates are better controlled.  Objective: Vitals:   07/04/19 1257 07/04/19 1259 07/04/19 1300 07/04/19 1329  BP:   129/74   Pulse: (!) 140 (!) 111 74   Resp: 19 20 18  (!) 26  Temp:      TempSrc:      SpO2: 100% 96% 100%   Weight:        Intake/Output Summary (Last 24 hours) at 07/04/2019 1335 Last data filed at 07/04/2019 0624 Gross per 24 hour  Intake -  Output 1600 ml  Net -1600 ml   Filed Weights   07/03/19 1637  Weight: 71 kg    Examination:  General exam:  Appears calm and comfortable  Respiratory system: Clear to auscultation. Respiratory effort normal.  Currently on 2 L nasal cannula oxygen. Cardiovascular system: S1 & S2 heard, irregular. No JVD, murmurs, rubs, gallops or clicks. No pedal edema. Gastrointestinal system: Abdomen is nondistended, soft and nontender. No organomegaly or masses felt. Normal bowel sounds heard. Central nervous system: Alert and oriented. No focal neurological deficits. Extremities: Symmetric 5 x 5 power. Skin: No rashes, lesions or ulcers Psychiatry: Judgement and insight appear normal. Mood & affect appropriate.     Data Reviewed: I have personally reviewed following labs and imaging studies  CBC: Recent Labs  Lab 07/03/19 1657 07/04/19 0542 07/04/19 1140  WBC 10.0 10.7* 8.7  NEUTROABS 7.2  --   --   HGB 11.4* 11.7* 11.8*  HCT 33.7* 34.4* 35.0*  MCV 88.5 87.8 88.4  PLT 303 299 0000000   Basic Metabolic Panel: Recent Labs  Lab 07/03/19 1657 07/04/19 0542  NA 125* 128*  K 4.2 3.3*  CL 89* 89*  CO2 25 26  GLUCOSE 110* 106*  BUN 18 17  CREATININE 0.96 0.86  CALCIUM 9.4 9.8  MG  --  1.7   GFR: Estimated Creatinine Clearance: 39.9 mL/min (by C-G formula based on SCr of 0.86 mg/dL). Liver Function Tests:  No results for input(s): AST, ALT, ALKPHOS, BILITOT, PROT, ALBUMIN in the last 168 hours. No results for input(s): LIPASE, AMYLASE in the last 168 hours. No results for input(s): AMMONIA in the last 168 hours. Coagulation Profile: Recent Labs  Lab 07/03/19 1701  INR 2.7*   Cardiac Enzymes: No results for input(s): CKTOTAL, CKMB, CKMBINDEX, TROPONINI in the last 168 hours. BNP (last 3 results) No results for input(s): PROBNP in the last 8760 hours. HbA1C: No results for input(s): HGBA1C in the last 72 hours. CBG: No results for input(s): GLUCAP in the last 168 hours. Lipid Profile: No results for input(s): CHOL, HDL, LDLCALC, TRIG, CHOLHDL, LDLDIRECT in the last 72 hours. Thyroid  Function Tests: Recent Labs    07/04/19 0542  TSH 2.855   Anemia Panel: No results for input(s): VITAMINB12, FOLATE, FERRITIN, TIBC, IRON, RETICCTPCT in the last 72 hours. Sepsis Labs: No results for input(s): PROCALCITON, LATICACIDVEN in the last 168 hours.  No results found for this or any previous visit (from the past 240 hour(s)).       Radiology Studies: Dg Chest Port 1 View  Result Date: 07/03/2019 CLINICAL DATA:  Weakness and pain. EXAM: PORTABLE CHEST 1 VIEW COMPARISON:  12/20/2016 FINDINGS: Cardiomegaly and LEFT-sided pacemaker again noted. Mild pulmonary vascular congestion identified. There is no evidence of focal airspace disease, pulmonary edema, suspicious pulmonary nodule/mass, pleural effusion, or pneumothorax. No acute bony abnormalities are identified. IMPRESSION: Cardiomegaly with mild pulmonary vascular congestion. Electronically Signed   By: Margarette Canada M.D.   On: 07/03/2019 18:08        Scheduled Meds: . diltiazem  30 mg Oral Q6H  . fluticasone  1 spray Each Nare Daily  . levothyroxine  112 mcg Oral QAC breakfast  . metoprolol tartrate  100 mg Oral BID  . pantoprazole  40 mg Oral Daily  . potassium chloride  40 mEq Oral BID  . pravastatin  40 mg Oral q1800  . warfarin  2.5 mg Oral Once  . Warfarin - Pharmacist Dosing Inpatient   Does not apply q1800   Continuous Infusions:   LOS: 0 days    Time spent: 30 minutes    Pratik Darleen Crocker, DO [facility] Pager [phone]  If 7PM-7AM, please contact night-coverage [email or web address] Password [email or web address] 07/04/2019, 1:35 PM

## 2019-07-04 NOTE — Progress Notes (Signed)
ANTICOAGULATION CONSULT NOTE - Initial Consult  Pharmacy Consult for:  warfarin dosing Indication: atrial fibrillation  Goal INR range: 2-3 Home dose: warfarin  2.5mg   on M-Tues-Thurs-Fri-Sun and 5mg  on Wed-Sat Current INR; 2.7 Last dose: 5mg  on 07-02-19   No Known Allergies  Patient Measurements: Weight: 156 lb 8.4 oz (71 kg) Heparin Dosing Weight:    Vital Signs: Temp: 98 F (36.7 C) (11/05 1640) Temp Source: Oral (11/05 1640) BP: 129/74 (11/06 0038) Pulse Rate: 72 (11/06 0038)  Labs: Recent Labs    07/03/19 1657 07/03/19 1701 07/03/19 1912  HGB 11.4*  --   --   HCT 33.7*  --   --   PLT 303  --   --   LABPROT  --  28.1*  --   INR  --  2.7*  --   CREATININE 0.96  --   --   TROPONINIHS 5  --  5    Estimated Creatinine Clearance: 35.7 mL/min (by C-G formula based on SCr of 0.96 mg/dL).   Medical History: Past Medical History:  Diagnosis Date  . Arthritis   . Gastroesophageal reflux disease   . Hearing impairment   . Hyperlipidemia   . Hypertension   . Hypothyroidism   . Impaired glucose tolerance   . Macular degeneration   . Pancreatitis   . Paroxysmal atrial fibrillation ([place]) 2011  . Sick sinus syndrome ([place]) 2011   Atrial fibrilation 10/2009; and dual chamber pacemaker in 4/11; normal EF  . Sleep apnea    Nocturnal oxygen therapy  . Zoster 2016     Assessment: Pharmacy consulted to dose warfarin for this 85 yo female with paroxysmal atrial fibrillation and sick sinus syndrome with  Pacemaker. She has been  on chronic anti-coagulation with warfarin.      Goal of Therapy:  INR 2-3 Monitor platelets by anticoagulation protocol: Yes   Plan:  Give warfarin 2.5mg  today for INR 2.7 (home dose)\ Monitor CBC every other day  Daily INR Monitor for signs/symptoms of bleeding.   Despina Pole, Pharm. D. Clinical Pharmacist 07/04/2019 1:13 AM

## 2019-07-04 NOTE — Progress Notes (Signed)
Spoke with patient's daughter [name] and she stated she spoke with someone at [facility] and they stated there will be a Cardiologist on call tomorrow. She stated she wanted the Cardiologist to see her mother tomorrow. She stated that her mother is very anxious about being in the hospital. Patient lying in bed with eyes closed at this time. No complaints. [facility] notified.

## 2019-07-04 NOTE — Progress Notes (Addendum)
ANTICOAGULATION CONSULT NOTE - Initial Consult  Pharmacy Consult for:  warfarin dosing Indication: atrial fibrillation    No Known Allergies  Patient Measurements: Weight: 156 lb 8.4 oz (71 kg) Heparin Dosing Weight:    Vital Signs: BP: 153/61 (11/06 0900) Pulse Rate: 78 (11/06 0900)  Labs: Recent Labs    07/03/19 1657 07/03/19 1701 07/03/19 1912 07/04/19 0542  HGB 11.4*  --   --  11.7*  HCT 33.7*  --   --  34.4*  PLT 303  --   --  299  LABPROT  --  28.1*  --   --   INR  --  2.7*  --   --   CREATININE 0.96  --   --  0.86  TROPONINIHS 5  --  5 6    Estimated Creatinine Clearance: 39.9 mL/min (by C-G formula based on SCr of 0.86 mg/dL).   Medical History: Past Medical History:  Diagnosis Date  . Arthritis   . Gastroesophageal reflux disease   . Hearing impairment   . Hyperlipidemia   . Hypertension   . Hypothyroidism   . Impaired glucose tolerance   . Macular degeneration   . Pancreatitis   . Paroxysmal atrial fibrillation ([place]) 2011  . Sick sinus syndrome ([place]) 2011   Atrial fibrilation 10/2009; and dual chamber pacemaker in 4/11; normal EF  . Sleep apnea    Nocturnal oxygen therapy  . Zoster 2016     Assessment: Pharmacy consulted to dose warfarin for this 85 yo female with paroxysmal atrial fibrillation and sick sinus syndrome with  Pacemaker. She has been  on chronic anti-coagulation with warfarin.  INR 2.7  Home dose: warfarin  2.5mg   on M-Tues-Thurs-Fri-Sun and 5mg  on Wed-Sat  Goal of Therapy:  INR 2-3 Monitor platelets by anticoagulation protocol: Yes   Plan:  Warfarin 2.5mg  x 1 Monitor CBC every other day  Daily INR Monitor for signs/symptoms of bleeding.  Isac Sarna, BS Pharm D, [place] Clinical Pharmacist Pager [phone] 07/04/2019 10:49 AM

## 2019-07-04 NOTE — Plan of Care (Signed)
  Problem: Acute Rehab PT Goals(only PT should resolve) Goal: Pt Will Go Supine/Side To Sit Outcome: Progressing Flowsheets (Taken 07/04/2019 1559) Pt will go Supine/Side to Sit: Independently Goal: Patient Will Transfer Sit To/From Stand Outcome: Progressing Flowsheets (Taken 07/04/2019 1559) Patient will transfer sit to/from stand: with supervision Goal: Pt Will Transfer Bed To Chair/Chair To Bed Outcome: Progressing Flowsheets (Taken 07/04/2019 1559) Pt will Transfer Bed to Chair/Chair to Bed: with supervision Goal: Pt Will Ambulate Outcome: Progressing Flowsheets (Taken 07/04/2019 1559) Pt will Ambulate:  50 feet  with supervision  with rolling walker   3:59 PM, 07/04/19 Lonell Grandchild, MPT Physical Therapist with [facility] [phone] office [phone] mobile phone

## 2019-07-04 NOTE — ED Notes (Signed)
Pt currently eating breakfast. In bed with NAD

## 2019-07-04 NOTE — ED Notes (Signed)
Echo in room

## 2019-07-04 NOTE — ED Notes (Signed)
Have notified Dr. Manuella Ghazi about pt's HR. Have put in order for 10mg  IV Cardizem push

## 2019-07-04 NOTE — ED Notes (Signed)
Pt given meal tray.

## 2019-07-05 ENCOUNTER — Other Ambulatory Visit: Payer: Self-pay | Admitting: Family Medicine

## 2019-07-05 LAB — BASIC METABOLIC PANEL
Anion gap: 9 (ref 5–15)
BUN: 16 mg/dL (ref 8–23)
CO2: 26 mmol/L (ref 22–32)
Calcium: 9.7 mg/dL (ref 8.9–10.3)
Chloride: 95 mmol/L — ABNORMAL LOW (ref 98–111)
Creatinine, Ser: 0.83 mg/dL (ref 0.44–1.00)
GFR calc Af Amer: >60 mL/min (ref >60)
GFR calc non Af Amer: >60 mL/min (ref >60)
Glucose, Bld: 96 mg/dL (ref 70–99)
Potassium: 4.5 mmol/L (ref 3.5–5.1)
Sodium: 130 mmol/L — ABNORMAL LOW (ref 135–145)

## 2019-07-05 LAB — CBC
HCT: 32.7 % — ABNORMAL LOW (ref 36.0–46.0)
Hemoglobin: 10.9 g/dL — ABNORMAL LOW (ref 12.0–15.0)
MCH: 29.9 pg (ref 26.0–34.0)
MCHC: 33.3 g/dL (ref 30.0–36.0)
MCV: 89.6 fL (ref 80.0–100.0)
Platelets: 276 10*3/uL (ref 150–400)
RBC: 3.65 MIL/uL — ABNORMAL LOW (ref 3.87–5.11)
RDW: 12.8 % (ref 11.5–15.5)
WBC: 8 10*3/uL (ref 4.0–10.5)
nRBC: 0 % (ref 0.0–0.2)

## 2019-07-05 LAB — MAGNESIUM: Magnesium: 1.8 mg/dL (ref 1.7–2.4)

## 2019-07-05 LAB — PROTIME-INR
INR: 2.6 — ABNORMAL HIGH (ref 0.8–1.2)
Prothrombin Time: 27.3 seconds — ABNORMAL HIGH (ref 11.4–15.2)

## 2019-07-05 MED ORDER — DILTIAZEM HCL ER COATED BEADS 120 MG PO CP24
120.0000 mg | ORAL_CAPSULE | Freq: Every day | ORAL | Status: DC
  Administered 2019-07-05: 10:00:00 120 mg via ORAL
  Filled 2019-07-05: qty 1

## 2019-07-05 MED ORDER — WARFARIN SODIUM 2.5 MG PO TABS: 2.5000 mg | ORAL_TABLET | Freq: Once | ORAL | Status: DC

## 2019-07-05 MED ORDER — DILTIAZEM HCL ER COATED BEADS 120 MG PO CP24: 120.0000 mg | ORAL_CAPSULE | Freq: Every day | ORAL | 2 refills | Status: DC

## 2019-07-05 NOTE — Discharge Summary (Signed)
Physician Discharge Summary  [name] [id number] DOB: 09/23/1925 DOA: 07/03/2019  PCP: Fayrene Helper, MD  Admit date: 07/03/2019  Discharge date: 07/05/2019  Admitted From:Home  Disposition:  Home  Recommendations for Outpatient Follow-up:  1. Follow up with PCP in 1-2 weeks 2. Follow up with Cardiologist Dr. Harl Bowie in 1 week to ensure HR stable  3. Discontinue hydrochlorothiazide and start Cardizem 120 mg CD daily as prescribed 4. Continue metoprolol as prior  Home Health: Yes with PT, RN  Equipment/Devices: None  Discharge Condition: Stable  CODE STATUS: Full  Diet recommendation: Heart Healthy  Brief/Interim Summary: Per HPI: [name] a 85 y.o.femalewith medical history significant forparoxysmal atrial fibrillation complicated by sick sinus syndrome status post pacemaker placement in 2011, chronic anticoagulation on warfarin, acquired hypothyroidism, hypertension, obstructive sleep apnea on 2 LO2 on QHS basis,who is admitted to [facility] on 07/03/2019 with atrial fibrillation with rapid ventricular response after presenting from home to [facility] emergency department for evaluation of elevated heart rate.  Earlier today, at the time of her physical therapy appointment with home health, the home health nurse reportedly noted the patient to be tachycardic, reporting heart rates into the 120s to 130s.The patient reports that she has felt very mild, generalized, nonfocal weakness over the last day, but otherwise denies any new symptoms. Specifically, she denies any associated chest pain, shortness of breath, palpitations, diaphoresis, nausea, vomiting, dizziness, presyncope.She also denies any recent orthopnea, PND, or peripheral edema. Denies subjective fever, chills, rigors, or generalized myalgias. She also denies any recent cough, shortness of breath, or any known Covid positive contacts. Denies any associated abdominal pain, diarrhea,  dysuria, gross hematuria, or change in urinary urgency/frequency.  The patient reports that she has been on the same dose of Lopressor 100 mg p.o. twice daily for several years, and reports outstanding compliance in taking this medication. Overall, the patient reports that she was very surprised to hear that her heart rate was elevated as she reports that she feels that she hasbeen at her baseline level of health without any new symptoms. Upon finding the patient's heart rate to be elevated today, home health nurse made arrangements for the patient to be seen at urgent care, who confirmed A. fib with RVR, prompting the patient to be transported from urgent care to [facility] emergency department via EMS for further evaluation and management of atrial fibrillation with RVR.  Of note, per my chart review, I have not encountered a previous echocardiogram on file.   The patient reports that, on June 10, 2019, that she experienced a ground-level mechanical fall while attempting to walk into her house.Specifically, she reports that she tripped while attempting to step up into the house, at which time her head moved forward striking the wood door frame.She denies any associated loss of consciousness, reports that she had mild bruising over the left temple for a few weeks following this incident. She denies any ensuing dizziness, change in vision, or headache. In the setting of presenting acute hyponatremia, the patient also reports that she is been on the same dose of hydrochlorothiazide for several years now.  11/6: Patient was admitted for tachycardia that was noticed by her home health nurse.  No symptoms otherwise noted.  She has been having some weakness however and is noted to have hyponatremia in the setting of HCTZ use.  She is now weaned off Cardizem IV drip and has been transitioned to oral Cardizem with stable heart rate control noted.  2D echocardiogram without any acute abnormalities  and TSH within normal limits.  Continue to monitor on telemetry with oral Cardizem to ensure stability.  11/7: Patient has remained with heart rate of approximately 80-90 bpm on telemetry with Cardizem 30 mg every 6 hours.  She has no further symptomatology noted at this time.  Her potassium level is currently 4.5 and has improved after supplementation.  Her sodium levels remain improved at 130 this morning as well.  I have spoken with the patient as well as extensively with the daughter over the phone about discontinuing home HCTZ and remaining on Cardizem daily as prescribed along with home metoprolol.  She will need close follow-up with her PCP and cardiologist to ensure that blood pressure and heart rates are remaining stable.  Will ensure that patient has home PT and RN to continue at home as well.  Discharge Diagnoses:  Principal Problem:   Atrial fibrillation with RVR (HCC) Active Problems:   Hypothyroidism   Essential hypertension   Chronic anticoagulation   Acute hyponatremia   Generalized weakness  Principal discharge diagnosis: Atrial fibrillation with RVR with mild hypokalemia-improved.  Discharge Instructions  Discharge Instructions    Diet - low sodium heart healthy   Complete by: As directed    Increase activity slowly   Complete by: As directed      Allergies as of 07/05/2019   No Known Allergies     Medication List    STOP taking these medications   azithromycin 250 MG tablet Commonly known as: ZITHROMAX   hydrochlorothiazide 25 MG tablet Commonly known as: HYDRODIURIL     TAKE these medications   Calcium Carb-Cholecalciferol 500-400 MG-UNIT Chew Chew 1 tablet by mouth daily.   diltiazem 120 MG 24 hr capsule Commonly known as: CARDIZEM CD Take 1 capsule (120 mg total) by mouth daily.   EYE VITAMINS & MINERALS PO Take 1 tablet by mouth daily.   fluticasone 50 MCG/ACT nasal spray Commonly known as: FLONASE Place 1 spray into both nostrils daily.    levothyroxine 112 MCG tablet Commonly known as: SYNTHROID TAKE 1 TABLET BY MOUTH ONCE DAILY. What changed: when to take this   lovastatin 40 MG tablet Commonly known as: MEVACOR TAKE ONE TABLET BY MOUTH DAILY. What changed: when to take this   meclizine 12.5 MG tablet Commonly known as: ANTIVERT Take 1 tablet (12.5 mg total) by mouth 2 (two) times daily as needed for dizziness.   metoprolol tartrate 100 MG tablet Commonly known as: LOPRESSOR TAKE 1 TABLET BY MOUTH TWICE A DAY.   omeprazole 40 MG capsule Commonly known as: PRILOSEC TAKE (1) CAPSULE BY MOUTH ONCE DAILY FOR ACID REFLUX. What changed: See the new instructions.   OXYGEN Inhale 2 L into the lungs at bedtime.   Potassium 99 MG Tabs Take 1 tablet by mouth at bedtime.   warfarin 5 MG tablet Commonly known as: COUMADIN Take as directed. If you are unsure how to take this medication, talk to your nurse or doctor. Original instructions: TAKE 1/2 TABLET BY MOUTH DAILY EXCEPT 1 TABLET ON WEDNESDAYS & SATURDAYS OR AS DIRECTED. What changed:   how much to take  how to take this  when to take this      Follow-up Information    Fayrene Helper, MD Follow up in 1 week(s).   Specialty: Family Medicine Contact information: [address] [place] [address] [phone]        Arnoldo Lenis, MD Follow  up in 1 week(s).   Specialty: Cardiology Contact information: [address] [place] [address] [phone]          No Known Allergies  Consultations:  None   Procedures/Studies: Ct Head Wo Contrast  Result Date: 06/10/2019 CLINICAL DATA:  Pain following fall EXAM: CT HEAD WITHOUT CONTRAST TECHNIQUE: Contiguous axial images were obtained from the base of the skull through the vertex without intravenous contrast. COMPARISON:  March 09, 2017 FINDINGS: Brain: There is age related volume loss, stable. There is no intracranial mass, hemorrhage, extra-axial fluid collection,  or midline shift. Patchy small vessel disease in the centra semiovale bilaterally is stable. No acute infarct is demonstrable on this study. Vascular: There is no hyperdense vessel. There is calcification in each carotid siphon region. Skull: Bony calvarium appears intact. Sinuses/Orbits: There is mucosal thickening, mild, in each visualized maxillary antrum. There is mucosal thickening and opacification in multiple ethmoid air cells. There is mild mucosal thickening in the anterior right sphenoid sinus. Orbits appear symmetric bilaterally. Other: Mastoid air cells are clear, although somewhat hypoplastic on the left, stable. IMPRESSION: Age related volume loss with periventricular small vessel disease. No acute infarct. No mass or hemorrhage. There are foci of arterial vascular calcification. There are foci of paranasal sinus disease. 5 Electronically Signed   By: Lowella Grip III M.D.   On: 06/10/2019 17:29   Dg Chest Port 1 View  Result Date: 07/03/2019 CLINICAL DATA:  Weakness and pain. EXAM: PORTABLE CHEST 1 VIEW COMPARISON:  12/20/2016 FINDINGS: Cardiomegaly and LEFT-sided pacemaker again noted. Mild pulmonary vascular congestion identified. There is no evidence of focal airspace disease, pulmonary edema, suspicious pulmonary nodule/mass, pleural effusion, or pneumothorax. No acute bony abnormalities are identified. IMPRESSION: Cardiomegaly with mild pulmonary vascular congestion. Electronically Signed   By: Margarette Canada M.D.   On: 07/03/2019 18:08     Discharge Exam: Vitals:   07/05/19 0031 07/05/19 0530  BP: (!) 126/59 126/80  Pulse: 84 86  Resp:  17  Temp:  98.1 F (36.7 C)  SpO2: 100%    Vitals:   07/04/19 2120 07/05/19 0031 07/05/19 0500 07/05/19 0530  BP: (!) 111/58 (!) 126/59  126/80  Pulse: 68 84  86  Resp: 17   17  Temp: 97.6 F (36.4 C)   98.1 F (36.7 C)  TempSrc: Oral   Oral  SpO2: 96% 100%    Weight:   75.4 kg     General: Pt is alert, awake, not in acute  distress Cardiovascular: Irregular, S1/S2 +, no rubs, no gallops Respiratory: CTA bilaterally, no wheezing, no rhonchi Abdominal: Soft, NT, ND, bowel sounds + Extremities: no edema, no cyanosis    The results of significant diagnostics from this hospitalization (including imaging, microbiology, ancillary and laboratory) are listed below for reference.     Microbiology: Recent Results (from the past 240 hour(s))  SARS CORONAVIRUS 2 (TAT 6-24 HRS) Nasopharyngeal Nasopharyngeal Swab     Status: None   Collection Time: 07/03/19  8:08 PM   Specimen: Nasopharyngeal Swab  Result Value Ref Range Status   SARS Coronavirus 2 NEGATIVE NEGATIVE Final    Comment: (NOTE) SARS-CoV-2 target nucleic acids are NOT DETECTED. The SARS-CoV-2 RNA is generally detectable in upper and lower respiratory specimens during the acute phase of infection. Negative results do not preclude SARS-CoV-2 infection, do not rule out co-infections with other pathogens, and should not be used as the sole basis for treatment or other patient management decisions. Negative results must be  combined with clinical observations, patient history, and epidemiological information. The expected result is Negative. Fact Sheet for Patients: SugarRoll.be Fact Sheet for Healthcare Providers: https://www.woods-mathews.com/ This test is not yet approved or cleared by the [place] FDA and  has been authorized for detection and/or diagnosis of SARS-CoV-2 by FDA under an Emergency Use Authorization (EUA). This EUA will remain  in effect (meaning this test can be used) for the duration of the COVID-19 declaration under Section 56 4(b)(1) of the Act, 21 U.S.C. section 360bbb-3(b)(1), unless the authorization is terminated or revoked sooner. Performed at [facility], [place] [address], [place], [place] [address]      Labs: BNP (last 3 results) Recent Labs    07/03/19 1657  BNP  0000000*   Basic Metabolic Panel: Recent Labs  Lab 07/03/19 1657 07/04/19 0542 07/05/19 0617  NA 125* 128* 130*  K 4.2 3.3* 4.5  CL 89* 89* 95*  CO2 25 26 26   GLUCOSE 110* 106* 96  BUN 18 17 16   CREATININE 0.96 0.86 0.83  CALCIUM 9.4 9.8 9.7  MG  --  1.7 1.8   Liver Function Tests: No results for input(s): AST, ALT, ALKPHOS, BILITOT, PROT, ALBUMIN in the last 168 hours. No results for input(s): LIPASE, AMYLASE in the last 168 hours. No results for input(s): AMMONIA in the last 168 hours. CBC: Recent Labs  Lab 07/03/19 1657 07/04/19 0542 07/04/19 1140 07/05/19 0617  WBC 10.0 10.7* 8.7 8.0  NEUTROABS 7.2  --   --   --   HGB 11.4* 11.7* 11.8* 10.9*  HCT 33.7* 34.4* 35.0* 32.7*  MCV 88.5 87.8 88.4 89.6  PLT 303 299 293 276   Cardiac Enzymes: No results for input(s): CKTOTAL, CKMB, CKMBINDEX, TROPONINI in the last 168 hours. BNP: Invalid input(s): POCBNP CBG: No results for input(s): GLUCAP in the last 168 hours. D-Dimer No results for input(s): DDIMER in the last 72 hours. Hgb A1c No results for input(s): HGBA1C in the last 72 hours. Lipid Profile No results for input(s): CHOL, HDL, LDLCALC, TRIG, CHOLHDL, LDLDIRECT in the last 72 hours. Thyroid function studies Recent Labs    07/04/19 0542  TSH 2.855   Anemia work up No results for input(s): VITAMINB12, FOLATE, FERRITIN, TIBC, IRON, RETICCTPCT in the last 72 hours. Urinalysis    Component Value Date/Time   COLORURINE YELLOW 07/03/2019 2328   APPEARANCEUR HAZY (A) 07/03/2019 2328   LABSPEC 1.005 07/03/2019 2328   PHURINE 6.0 07/03/2019 2328   GLUCOSEU NEGATIVE 07/03/2019 2328   HGBUR LARGE (A) 07/03/2019 2328   HGBUR trace-lysed 01/11/2010 1458   BILIRUBINUR NEGATIVE 07/03/2019 2328   BILIRUBINUR neg 07/26/2017 1120   KETONESUR NEGATIVE 07/03/2019 2328   PROTEINUR NEGATIVE 07/03/2019 2328   UROBILINOGEN 1.0 07/26/2017 1120   UROBILINOGEN 0.2 02/09/2015 1715   NITRITE NEGATIVE 07/03/2019 2328    LEUKOCYTESUR SMALL (A) 07/03/2019 2328   Sepsis Labs Invalid input(s): PROCALCITONIN,  WBC,  LACTICIDVEN Microbiology Recent Results (from the past 240 hour(s))  SARS CORONAVIRUS 2 (TAT 6-24 HRS) Nasopharyngeal Nasopharyngeal Swab     Status: None   Collection Time: 07/03/19  8:08 PM   Specimen: Nasopharyngeal Swab  Result Value Ref Range Status   SARS Coronavirus 2 NEGATIVE NEGATIVE Final    Comment: (NOTE) SARS-CoV-2 target nucleic acids are NOT DETECTED. The SARS-CoV-2 RNA is generally detectable in upper and lower respiratory specimens during the acute phase of infection. Negative results do not preclude SARS-CoV-2 infection, do not rule out co-infections with other  pathogens, and should not be used as the sole basis for treatment or other patient management decisions. Negative results must be combined with clinical observations, patient history, and epidemiological information. The expected result is Negative. Fact Sheet for Patients: SugarRoll.be Fact Sheet for Healthcare Providers: https://www.woods-mathews.com/ This test is not yet approved or cleared by the [place] FDA and  has been authorized for detection and/or diagnosis of SARS-CoV-2 by FDA under an Emergency Use Authorization (EUA). This EUA will remain  in effect (meaning this test can be used) for the duration of the COVID-19 declaration under Section 56 4(b)(1) of the Act, 21 U.S.C. section 360bbb-3(b)(1), unless the authorization is terminated or revoked sooner. Performed at [facility], [place] [address], [place], [place] [address]      Time coordinating discharge: 35 minutes  SIGNED:   Rodena Goldmann, DO [facility] 07/05/2019, 9:01 AM  If 7PM-7AM, please contact night-coverage [email or web address]

## 2019-07-05 NOTE — Progress Notes (Signed)
ANTICOAGULATION CONSULT NOTE -  Consult  Pharmacy Consult for:  warfarin dosing Indication: atrial fibrillation    No Known Allergies  Patient Measurements: Weight: 166 lb 3.6 oz (75.4 kg) Heparin Dosing Weight:    Vital Signs: Temp: 98.1 F (36.7 C) (11/07 0530) Temp Source: Oral (11/07 0530) BP: 126/80 (11/07 0530) Pulse Rate: 86 (11/07 0530)  Labs: Recent Labs    07/03/19 1657 07/03/19 1701 07/03/19 1912 07/04/19 0542 07/04/19 1140 07/05/19 0617  HGB 11.4*  --   --  11.7* 11.8* 10.9*  HCT 33.7*  --   --  34.4* 35.0* 32.7*  PLT 303  --   --  299 293 276  LABPROT  --  28.1*  --   --   --  27.3*  INR  --  2.7*  --   --   --  2.6*  CREATININE 0.96  --   --  0.86  --  0.83  TROPONINIHS 5  --  5 6  --   --     Estimated Creatinine Clearance: 45.8 mL/min (by C-G formula based on SCr of 0.83 mg/dL).   Medical History: Past Medical History:  Diagnosis Date  . Arthritis   . Gastroesophageal reflux disease   . Hearing impairment   . Hyperlipidemia   . Hypertension   . Hypothyroidism   . Impaired glucose tolerance   . Macular degeneration   . Pancreatitis   . Paroxysmal atrial fibrillation ([place]) 2011  . Sick sinus syndrome ([place]) 2011   Atrial fibrilation 10/2009; and dual chamber pacemaker in 4/11; normal EF  . Sleep apnea    Nocturnal oxygen therapy  . Zoster 2016     Assessment: Pharmacy consulted to dose warfarin for this 85 yo female with paroxysmal atrial fibrillation and sick sinus syndrome with  Pacemaker. She has been  on chronic anti-coagulation with warfarin.  INR 2.6  Home dose: warfarin  2.5mg   on M-Tues-Thurs-Fri-Sun and 5mg  on Wed-Sat  Goal of Therapy:  INR 2-3 Monitor platelets by anticoagulation protocol: Yes   Plan:  Warfarin 2.5mg  x 1 Monitor CBC every other day  Daily INR Monitor for signs/symptoms of bleeding.  Thomasenia Sales, PharmD, MBA, BCGP Clinical Pharmacist  07/05/2019 8:58 AM

## 2019-07-05 NOTE — Plan of Care (Signed)

## 2019-07-07 ENCOUNTER — Telehealth: Payer: Self-pay | Admitting: Cardiology

## 2019-07-07 NOTE — Telephone Encounter (Signed)
Daughter aware, will bring  Mother to apt

## 2019-07-07 NOTE — Telephone Encounter (Signed)
Will forward to Dr. Branch. 

## 2019-07-07 NOTE — Telephone Encounter (Signed)
Recently in hospital was told to see Dr Harl Bowie this week.  Nothing available in either location  Please advice when to schedule

## 2019-07-07 NOTE — Telephone Encounter (Signed)
Could do Friday 220 pm in [place] MD

## 2019-07-08 ENCOUNTER — Other Ambulatory Visit: Payer: Self-pay

## 2019-07-08 ENCOUNTER — Emergency Department (HOSPITAL_COMMUNITY)
Admission: EM | Admit: 2019-07-08 | Discharge: 2019-07-08 | Disposition: A | Discharge status: Home / Self Care | Payer: Medicare Other | Attending: Emergency Medicine | Admitting: Emergency Medicine

## 2019-07-08 ENCOUNTER — Telehealth: Payer: Self-pay

## 2019-07-08 ENCOUNTER — Encounter (HOSPITAL_COMMUNITY): Payer: Self-pay | Admitting: *Deleted

## 2019-07-08 ENCOUNTER — Ambulatory Visit (INDEPENDENT_AMBULATORY_CARE_PROVIDER_SITE_OTHER)
Admission: EM | Admit: 2019-07-08 | Discharge: 2019-07-08 | Disposition: A | Discharge status: Home / Self Care | Payer: Medicare Other | Source: Home / Self Care

## 2019-07-08 DIAGNOSIS — Z7901 Long term (current) use of anticoagulants: Secondary | ICD-10-CM | POA: Diagnosis not present

## 2019-07-08 DIAGNOSIS — Z79899 Other long term (current) drug therapy: Secondary | ICD-10-CM | POA: Diagnosis not present

## 2019-07-08 DIAGNOSIS — I10 Essential (primary) hypertension: Secondary | ICD-10-CM | POA: Diagnosis not present

## 2019-07-08 DIAGNOSIS — I4891 Unspecified atrial fibrillation: Secondary | ICD-10-CM | POA: Diagnosis not present

## 2019-07-08 DIAGNOSIS — E039 Hypothyroidism, unspecified: Secondary | ICD-10-CM | POA: Diagnosis not present

## 2019-07-08 DIAGNOSIS — R000 Tachycardia, unspecified: Secondary | ICD-10-CM | POA: Diagnosis present

## 2019-07-08 DIAGNOSIS — Z743 Need for continuous supervision: Secondary | ICD-10-CM | POA: Diagnosis not present

## 2019-07-08 DIAGNOSIS — R9431 Abnormal electrocardiogram [ECG] [EKG]: Secondary | ICD-10-CM | POA: Diagnosis not present

## 2019-07-08 DIAGNOSIS — R5383 Other fatigue: Secondary | ICD-10-CM

## 2019-07-08 DIAGNOSIS — Z950 Presence of cardiac pacemaker: Secondary | ICD-10-CM | POA: Diagnosis not present

## 2019-07-08 DIAGNOSIS — H938X9 Other specified disorders of ear, unspecified ear: Secondary | ICD-10-CM

## 2019-07-08 DIAGNOSIS — I4819 Other persistent atrial fibrillation: Secondary | ICD-10-CM | POA: Diagnosis not present

## 2019-07-08 DIAGNOSIS — R3989 Other symptoms and signs involving the genitourinary system: Secondary | ICD-10-CM

## 2019-07-08 LAB — POCT URINALYSIS DIP (MANUAL ENTRY)
Bilirubin, UA: NEGATIVE
Glucose, UA: NEGATIVE mg/dL
Ketones, POC UA: NEGATIVE mg/dL
Leukocytes, UA: NEGATIVE
Nitrite, UA: NEGATIVE
Protein Ur, POC: NEGATIVE mg/dL
Spec Grav, UA: <=1.005 — AB (ref 1.010–1.025)
Urobilinogen, UA: 0.2 E.U./dL
pH, UA: 5.5 (ref 5.0–8.0)

## 2019-07-08 LAB — CBC
HCT: 33 % — ABNORMAL LOW (ref 36.0–46.0)
Hemoglobin: 10.8 g/dL — ABNORMAL LOW (ref 12.0–15.0)
MCH: 29.7 pg (ref 26.0–34.0)
MCHC: 32.7 g/dL (ref 30.0–36.0)
MCV: 90.7 fL (ref 80.0–100.0)
Platelets: 287 10*3/uL (ref 150–400)
RBC: 3.64 MIL/uL — ABNORMAL LOW (ref 3.87–5.11)
RDW: 12.8 % (ref 11.5–15.5)
WBC: 9 10*3/uL (ref 4.0–10.5)
nRBC: 0 % (ref 0.0–0.2)

## 2019-07-08 LAB — BASIC METABOLIC PANEL
Anion gap: 8 (ref 5–15)
BUN: 14 mg/dL (ref 8–23)
CO2: 25 mmol/L (ref 22–32)
Calcium: 9.6 mg/dL (ref 8.9–10.3)
Chloride: 95 mmol/L — ABNORMAL LOW (ref 98–111)
Creatinine, Ser: 0.87 mg/dL (ref 0.44–1.00)
GFR calc Af Amer: >60 mL/min (ref >60)
GFR calc non Af Amer: 58 mL/min — ABNORMAL LOW (ref >60)
Glucose, Bld: 109 mg/dL — ABNORMAL HIGH (ref 70–99)
Potassium: 4.8 mmol/L (ref 3.5–5.1)
Sodium: 128 mmol/L — ABNORMAL LOW (ref 135–145)

## 2019-07-08 LAB — TROPONIN I (HIGH SENSITIVITY)
Troponin I (High Sensitivity): 5 ng/L (ref <18)
Troponin I (High Sensitivity): 5 ng/L (ref <18)

## 2019-07-08 LAB — PROTIME-INR
INR: 2.6 — ABNORMAL HIGH (ref 0.8–1.2)
Prothrombin Time: 27.1 seconds — ABNORMAL HIGH (ref 11.4–15.2)

## 2019-07-08 MED ORDER — DILTIAZEM HCL ER COATED BEADS 180 MG PO CP24: 180.0000 mg | ORAL_CAPSULE | Freq: Every day | ORAL | 1 refills | Status: DC

## 2019-07-08 NOTE — ED Triage Notes (Signed)
Pt presents with c/o ears popping , pressure in bladder and tachycardia. Pt here last week for tachycardia and was DC from hospital on Saturday . Pt is concerned because she is feeling palpitations

## 2019-07-08 NOTE — Telephone Encounter (Signed)
Pt's daughter called in stating that her mother was seen in ER, admitted to hospital over weekend due to her afib. Since coming home her heart rates and blood pressure has been climbing. (142/99  82, 123/99 107, 169/104  124, 130/99   134) Pt is not symptomatic. Daughter is very worried that her blood pressure and pulse rate keeps climbing. She is worried that medication is not controlling her heart rates well enough.She asks if she could be seen sooner than Friday. Please advise.

## 2019-07-08 NOTE — Telephone Encounter (Signed)
Was hard to hear everything she said but it was about her mom being back admitted

## 2019-07-08 NOTE — ED Provider Notes (Signed)
[place]   [id number] 07/08/19 Arrival Time: [id number]   CC: Tachycardia, fatigue  SUBJECTIVE:  [name] is a 85 y.o. female hx significant for a fib, HLD, GERD, HTN, hypothyroid, and sleep apnea, who presents with complaint of tachycardia and fatigue x 1 day.  Also reports an episode of palpitations that occurred this morning that lasted a few seconds, now resolved.  Heart rate has been between 73-124 bpm over the past 3 days.  Patient seen in UC last week for similar symptoms and ultimately routed to [facility] ED for further work-up. Discharged from [facility] ED on 07/05/2019, was admitted on 07/03/2019 for a fib with RVR complicated by sick sinus syndrome status post pacemaker placement in 2011, and chronic anticoagulation on warfarin.  Heart rate was stabilized in the ED with IV cardizem, and patient was transitioned to oral cardizem.  Currently taking cardizem 120 mg and instructed to follow up with cardiology.  Has appt with cardiology this Friday.  Family spoke to cardiology nurse and recommended she come her for further evaluation.  Denies chest pain.  Fatigue made worse with activity.  Reports previous symptoms in the past with a fib with RVR.  Also mentions ear pressure, and bladder pressure.  Denies fever, chills, lightheadedness, dizziness, palpitations, SOB, nausea, vomiting, abdominal pain, changes in bowel or bladder habits, diaphoresis, numbness/tingling in extremities, peripheral edema, or anxiety.    ROS: As per HPI.  All other pertinent ROS negative.    Past Medical History:  Diagnosis Date  . Arthritis   . Gastroesophageal reflux disease   . Hearing impairment   . Hyperlipidemia   . Hypertension   . Hypothyroidism   . Impaired glucose tolerance   . Macular degeneration   . Pancreatitis   . Paroxysmal atrial fibrillation ([place]) 2011  . Sick sinus syndrome ([place]) 2011   Atrial fibrilation 10/2009; and dual chamber pacemaker in 4/11; normal EF  . Sleep apnea     Nocturnal oxygen therapy  . Zoster 2016   Past Surgical History:  Procedure Laterality Date  . ABDOMINAL HYSTERECTOMY    . CATARACT EXTRACTION     Bilateral  . COLONOSCOPY  2009   Dr. Gala Romney  . EYE SURGERY     laser   . PACEMAKER INSERTION  2011   dual chamber   . PPM GENERATOR CHANGEOUT N/A 11/13/2016   Procedure: PPM Generator Changeout;  Surgeon: Evans Lance, MD;  Location: [place] [facility];  Service: Cardiovascular;  Laterality: N/A;  . SALPINGOOPHORECTOMY  1970   right   No Known Allergies No current facility-administered medications on file prior to encounter.    Current Outpatient Medications on File Prior to Encounter  Medication Sig Dispense Refill  . Calcium Carb-Cholecalciferol 500-400 MG-UNIT CHEW Chew 1 tablet by mouth daily.  60 tablet   . diltiazem (CARDIZEM CD) 120 MG 24 hr capsule Take 1 capsule (120 mg total) by mouth daily. 30 capsule 2  . fluticasone (FLONASE) 50 MCG/ACT nasal spray Place 1 spray into both nostrils daily.    Marland Kitchen levothyroxine (SYNTHROID) 112 MCG tablet TAKE 1 TABLET BY MOUTH ONCE DAILY. 90 tablet 1  . lovastatin (MEVACOR) 40 MG tablet TAKE ONE TABLET BY MOUTH DAILY. (Patient taking differently: Take 40 mg by mouth at bedtime. ) 30 tablet 6  . meclizine (ANTIVERT) 12.5 MG tablet Take 1 tablet (12.5 mg total) by mouth 2 (two) times daily as needed for dizziness. 30 tablet 0  . metoprolol tartrate (LOPRESSOR)  100 MG tablet TAKE 1 TABLET BY MOUTH TWICE A DAY. (Patient taking differently: Take 100 mg by mouth 2 (two) times daily. ) 180 tablet 3  . Multiple Vitamins-Minerals (EYE VITAMINS & MINERALS PO) Take 1 tablet by mouth daily.    Marland Kitchen omeprazole (PRILOSEC) 40 MG capsule TAKE (1) CAPSULE BY MOUTH ONCE DAILY FOR ACID REFLUX. (Patient taking differently: Take 40 mg by mouth every morning. ) 30 capsule 0  . OXYGEN-HELIUM IN Inhale 2 L into the lungs at bedtime.    . Potassium 99 MG TABS Take 1 tablet by mouth at bedtime.     Marland Kitchen warfarin (COUMADIN) 5  MG tablet TAKE 1/2 TABLET BY MOUTH DAILY EXCEPT 1 TABLET ON WEDNESDAYS & SATURDAYS OR AS DIRECTED. (Patient taking differently: Take 2.5-5 mg by mouth See admin instructions. TAKE 1/2 TABLET BY MOUTH DAILY EXCEPT 1 TABLET ON WEDNESDAYS & SATURDAYS OR AS DIRECTED.) 25 tablet 2  . [DISCONTINUED] benzonatate (TESSALON PERLES) 100 MG capsule Take 1 capsule (100 mg total) by mouth every 6 (six) hours as needed for cough. 30 capsule 0   Social History   Socioeconomic History  . Marital status: Widowed    Spouse name: Not on file  . Number of children: 7  . Years of education: college  . Highest education level: Not on file  Occupational History  . Occupation: retired     Fish farm manager: RETIRED  Social Needs  . Financial resource strain: Not very hard  . Food insecurity    Worry: Never true    Inability: Never true  . Transportation needs    Medical: No    Non-medical: No  Tobacco Use  . Smoking status: Never Smoker  . Smokeless tobacco: Never Used  Substance and Sexual Activity  . Alcohol use: No    Alcohol/week: 0.0 standard drinks  . Drug use: No  . Sexual activity: Not Currently  Lifestyle  . Physical activity    Days per week: 2 days    Minutes per session: 30 min  . Stress: Not at all  Relationships  . Social connections    Talks on phone: More than three times a week    Gets together: Once a week    Attends religious service: Never    Active member of club or organization: No    Attends meetings of clubs or organizations: Never    Relationship status: Widowed  . Intimate partner violence    Fear of current or ex partner: No    Emotionally abused: No    Physically abused: No    Forced sexual activity: No  Other Topics Concern  . Not on file  Social History Narrative  . Not on file   Family History  Problem Relation Age of Onset  . Cancer Mother        gential  . Cancer Father        throat   . Pneumonia Sister   . Emphysema Sister   . Mitral valve prolapse  Sister   . Heart disease Other   . Arthritis Other   . Lung disease Other   . Asthma Other   . Melanoma Son      OBJECTIVE:  Vitals:   07/08/19 1328  BP: (!) 161/84  Pulse: 100  Resp: 20  Temp: 98.4 F (36.9 C)  SpO2: 98%    General appearance: alert; no distress Eyes: PERRLA; EOMI; conjunctiva normal HENT: normocephalic; atraumatic; EACs clear, TMs pearly gray; nares patent without rhinorrhea; oropharynx  clear, tonsils not enlarged or erythematous Neck: supple Lungs: clear to auscultation bilaterally without adventitious breath sounds Heart: Regularly irregular Abdomen: soft, non-tender; bowel sounds normal; no guarding Extremities: no cyanosis, mild pitting edema bilateral LE to distal third of tibia; symmetrical with no gross deformities Skin: warm and dry Psychological: alert and cooperative; normal mood and affect  ECG: Orders placed or performed during the hospital encounter of 07/08/19  . ED EKG  . ED EKG   EKG with atrial fibrillation with RVR.  Comparable to last EKG.    LABS:  Results for orders placed or performed during the hospital encounter of 07/08/19 (from the past 24 hour(s))  POCT urinalysis dipstick     Status: Abnormal   Collection Time: 07/08/19  1:43 PM  Result Value Ref Range   Color, UA yellow yellow   Clarity, UA clear clear   Glucose, UA negative negative mg/dL   Bilirubin, UA negative negative   Ketones, POC UA negative negative mg/dL   Spec Grav, UA <=1.005 (A) 1.010 - 1.025   Blood, UA moderate (A) negative   pH, UA 5.5 5.0 - 8.0   Protein Ur, POC negative negative mg/dL   Urobilinogen, UA 0.2 0.2 or 1.0 E.U./dL   Nitrite, UA Negative Negative   Leukocytes, UA Negative Negative     ASSESSMENT & PLAN:  1. Atrial fibrillation with RVR (HCC)   2. Other fatigue   3. Sensation of pressure in bladder area   4. Pressure sensation in ear, unspecified laterality     Recommending further evaluation and management in the ED for atrial  fibrillation with RVR.  Patient and family aware.  Patient and family requests EMS transport to ED.     Lestine Box, PA-C 07/08/19 1419

## 2019-07-08 NOTE — ED Triage Notes (Signed)
Pt brought in by [facility] from [place] office due to pt's EKG showing A. Fib with RVR which she was recently admitted to the hospital for. Pt felt a "palpitation" while at the doctor and they did the EKG and sent her to ED for further evaluation. EMS reports pt's HR is 102. Pt denies CP, SOB.

## 2019-07-08 NOTE — ED Notes (Signed)
[facility] and is requesting transport by ems. c- comm called and report called to triage. Pt stable not requiring 02

## 2019-07-08 NOTE — ED Provider Notes (Signed)
[facility] EMERGENCY DEPARTMENT Provider Note   CSN: [id number] Arrival date & time: 07/08/19  1439     History   Chief Complaint Chief Complaint  Patient presents with  . Atrial Fibrillation    HPI [name] is a 85 y.o. female.     HPI  This patient is a 85 year old female, she has a known history of atrial fibrillation, sick sinus syndrome status post pacemaker in 2011, she was recently admitted to the hospital for atrial fibrillation with rapid ventricular rate.  She was discharged home and is taking diltiazem, metoprolol and warfarin.  Her family members were concerned because of heart rate and blood pressure today which were both elevated per the paramedics report although the paramedics found her to have a normal rate between 80 and 100 and her normal blood pressure of [id number] systolic.  She initially went to the urgent care with family members but of ambulance was called for transport to the hospital due to recent complications of atrial fibrillation with admission.  No history of stroke, the patient does not have any symptoms whatsoever including no coughing shortness of breath chest pain headache blurred vision vomiting or diarrhea, no blood in the stool.  Paramedics report a normal heart rate and an EKG was obtained at the urgent care which showed a heart rate of approximately 100 and atrial fibrillation.    Past Medical History:  Diagnosis Date  . Arthritis   . Gastroesophageal reflux disease   . Hearing impairment   . Hyperlipidemia   . Hypertension   . Hypothyroidism   . Impaired glucose tolerance   . Macular degeneration   . Pancreatitis   . Paroxysmal atrial fibrillation ([place]) 2011  . Sick sinus syndrome ([place]) 2011   Atrial fibrilation 10/2009; and dual chamber pacemaker in 4/11; normal EF  . Sleep apnea    Nocturnal oxygen therapy  . Zoster 2016    Patient Active Problem List   Diagnosis Date Noted  . Acute hyponatremia 07/04/2019  . Generalized  weakness 07/04/2019  . Atrial fibrillation with RVR ([place]) 07/03/2019  . Light headedness 05/18/2019  . Vertigo 05/18/2019  . Cough 12/23/2016  . Nocturnal hypoxia 12/03/2016  . Osteopenia 10/18/2016  . At high risk for falls 10/21/2015  . Need for immunization against influenza 09/01/2014  . Seasonal allergies 05/05/2014  . Ethmoid sinusitis 02/05/2013  . Sick sinus syndrome ([place])   . Chronic anticoagulation 11/18/2010  . Impaired fasting glucose 10/14/2010  . Paroxysmal atrial fibrillation ([place]) 06/14/2010  . 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 Hypothyroidism 02/25/2008  . Hyperlipidemia 02/25/2008  . HEARING LOSS 02/25/2008  . Essential hypertension 02/25/2008    Past Surgical History:  Procedure Laterality Date  . ABDOMINAL HYSTERECTOMY    . CATARACT EXTRACTION     Bilateral  . COLONOSCOPY  2009   Dr. Gala Romney  . EYE SURGERY     laser   . PACEMAKER INSERTION  2011   dual chamber   . PPM GENERATOR CHANGEOUT N/A 11/13/2016   Procedure: PPM Generator Changeout;  Surgeon: Evans Lance, MD;  Location: [place] [facility];  Service: Cardiovascular;  Laterality: N/A;  . SALPINGOOPHORECTOMY  1970   right     OB History   No obstetric history on file.      Home Medications    Prior to Admission medications   Medication Sig Start Date End Date Taking? Authorizing Provider  levothyroxine (SYNTHROID) 112 MCG tablet TAKE 1 TABLET BY MOUTH ONCE DAILY. Patient taking  differently: Take 112 mcg by mouth daily before breakfast.  07/06/19  Yes Fayrene Helper, MD  lovastatin (MEVACOR) 40 MG tablet TAKE ONE TABLET BY MOUTH DAILY. Patient taking differently: Take 40 mg by mouth at bedtime.  12/23/18  Yes Branch, Alphonse Guild, MD  metoprolol tartrate (LOPRESSOR) 100 MG tablet TAKE 1 TABLET BY MOUTH TWICE A DAY. Patient taking differently: Take 100 mg by mouth 2 (two) times daily.  09/09/18  Yes Branch, Alphonse Guild, MD  omeprazole (PRILOSEC) 40 MG capsule TAKE (1) CAPSULE BY MOUTH ONCE DAILY FOR  ACID REFLUX. Patient taking differently: Take 40 mg by mouth every morning.  06/09/19  Yes Fayrene Helper, MD  OXYGEN-HELIUM IN Inhale 2 L into the lungs at bedtime.   Yes [provider]  Calcium Carb-Cholecalciferol 500-400 MG-UNIT CHEW Chew 1 tablet by mouth daily.  10/07/17   Fayrene Helper, MD  diltiazem (CARDIZEM CD) 180 MG 24 hr capsule Take 1 capsule (180 mg total) by mouth daily. 07/08/19   Noemi Chapel, MD  fluticasone (FLONASE) 50 MCG/ACT nasal spray Place 1 spray into both nostrils daily.    [provider]  meclizine (ANTIVERT) 12.5 MG tablet Take 1 tablet (12.5 mg total) by mouth 2 (two) times daily as needed for dizziness. 05/15/19   Fayrene Helper, MD  Multiple Vitamins-Minerals (EYE VITAMINS & MINERALS PO) Take 1 tablet by mouth daily.    [provider]  Potassium 99 MG TABS Take 1 tablet by mouth at bedtime.     [provider]  warfarin (COUMADIN) 5 MG tablet TAKE 1/2 TABLET BY MOUTH DAILY EXCEPT 1 TABLET ON WEDNESDAYS & SATURDAYS OR AS DIRECTED. Patient taking differently: Take 2.5-5 mg by mouth See admin instructions. TAKE 1/2 TABLET BY MOUTH DAILY EXCEPT 1 TABLET ON WEDNESDAYS & SATURDAYS OR AS DIRECTED. 05/26/19   Arnoldo Lenis, MD  benzonatate (TESSALON PERLES) 100 MG capsule Take 1 capsule (100 mg total) by mouth every 6 (six) hours as needed for cough. 02/05/13 11/04/18  Fayrene Helper, MD    Family History Family History  Problem Relation Age of Onset  . Cancer Mother        gential  . Cancer Father        throat   . Pneumonia Sister   . Emphysema Sister   . Mitral valve prolapse Sister   . Heart disease Other   . Arthritis Other   . Lung disease Other   . Asthma Other   . Melanoma Son     Social History Social History   Tobacco Use  . Smoking status: Never Smoker  . Smokeless tobacco: Never Used  Substance Use Topics  . Alcohol use: No    Alcohol/week: 0.0 standard drinks  . Drug use: No      Allergies   Patient has no known allergies.   Review of Systems Review of Systems  All other systems reviewed and are negative.    Physical Exam Updated Vital Signs BP (!) 147/76 (BP Location: Left Arm)   Pulse 95   Temp 98.5 F (36.9 C) (Oral)   Resp 18   Ht 1.676 m (5\' 6" )   Wt 75.4 kg   SpO2 100%   BMI 26.83 kg/m   Physical Exam Vitals signs and nursing note reviewed.  Constitutional:      General: She is not in acute distress.    Appearance: She is well-developed.  HENT:     Head: Normocephalic and atraumatic.  Mouth/Throat:     Pharynx: No oropharyngeal exudate.  Eyes:     General: No scleral icterus.       Right eye: No discharge.        Left eye: No discharge.     Conjunctiva/sclera: Conjunctivae normal.     Pupils: Pupils are equal, round, and reactive to light.  Neck:     Musculoskeletal: Normal range of motion and neck supple.     Thyroid: No thyromegaly.     Vascular: No JVD.  Cardiovascular:     Rate and Rhythm: Normal rate. Rhythm irregular.     Heart sounds: Normal heart sounds. No murmur. No friction rub. No gallop.   Pulmonary:     Effort: Pulmonary effort is normal. No respiratory distress.     Breath sounds: Normal breath sounds. No wheezing or rales.  Abdominal:     General: Bowel sounds are normal. There is no distension.     Palpations: Abdomen is soft. There is no mass.     Tenderness: There is no abdominal tenderness.  Musculoskeletal: Normal range of motion.        General: No tenderness.     Right lower leg: Edema present.     Left lower leg: Edema present.     Comments: Scant bilateral lower extremity edema  Lymphadenopathy:     Cervical: No cervical adenopathy.  Skin:    General: Skin is warm and dry.     Findings: No erythema or rash.  Neurological:     Mental Status: She is alert.     Coordination: Coordination normal.  Psychiatric:        Behavior: Behavior normal.      ED Treatments / Results  Labs (all labs  ordered are listed, but only abnormal results are displayed) Labs Reviewed  BASIC METABOLIC PANEL - Abnormal; Notable for the following components:      Result Value   Sodium 128 (*)    Chloride 95 (*)    Glucose, Bld 109 (*)    GFR calc non Af Amer 58 (*)    All other components within normal limits  CBC - Abnormal; Notable for the following components:   RBC 3.64 (*)    Hemoglobin 10.8 (*)    HCT 33.0 (*)    All other components within normal limits  PROTIME-INR - Abnormal; Notable for the following components:   Prothrombin Time 27.1 (*)    INR 2.6 (*)    All other components within normal limits  TROPONIN I (HIGH SENSITIVITY)    EKG EKG Interpretation  Date/Time:  Tuesday July 08 2019 14:56:05 EST Ventricular Rate:  88 PR Interval:    QRS Duration: 130 QT Interval:  358 QTC Calculation: 433 R Axis:   66 Text Interpretation:  Suspect unspecified pacemaker failure Atrial fibrillation with occasional ventricular-paced complexes Right bundle branch block T wave abnormality, consider inferior ischemia Abnormal ECG Since last tracing twa new Confirmed by Noemi Chapel [phone]) on 07/08/2019 3:45:41 PM   Radiology No results found.  Procedures Procedures (including critical care time)  Medications Ordered in ED Medications - No data to display   Initial Impression / Assessment and Plan / ED Course  I have reviewed the triage vital signs and the nursing notes.  Pertinent labs & imaging results that were available during my care of the patient were reviewed by me and considered in my medical decision making (see chart for details).  Clinical Course as of Jul 07 1701  Tue Jul 08, 2019  1546 EKG is different than prior EKGs with T wave inversions in leads II 3 and F.  Will consult with cardiology, labs pending   [BM]  1555 Discussed the case with the cardiologist Dr. Harl Bowie who recommends increasing Cardizem, checking a troponin, has follow-up tomorrow in the office.   If troponin is significantly elevated would need admission otherwise the patient can be discharged home, especially since she is asymptomatic   [BM]    Clinical Course User Index [BM] Noemi Chapel, MD       The patient's lungs are clear, the heart is irregular but rate seems to be borderline controlled, she does not appear critically ill.  Will obtain an EKG and some basic labs, discussed with family to determine neck steps.  With her history of atrial fibrillation and recent admission it is not concerning at all to see her still in atrial fibrillation.  She had an echocardiogram in the hospital which was unremarkable showing no acute abnormalities, with an ejection fraction of 65 to 70%, mild increased left ventricular hypertrophy., TSH was within normal limits, oral Cardizem was transitioned from IV Cardizem and the patient did very well.  The patient's family members were worried that her medications were not controlling her rate adequately for her blood pressure.  Medical record reviewed, telephone encounters tracked back and forth, cardiology appointment for Friday  Labs unremarkable, patient stable for discharge, she has a chronic mild hyponatremia.  I discussed the care with the family members who are coming to pick her up and they are in agreement with the plan, she has an appointment at 2:00 in the afternoon tomorrow with Dr. Harl Bowie  Final Clinical Impressions(s) / ED Diagnoses   Final diagnoses:  Persistent atrial fibrillation [facility])    ED Discharge Orders         Ordered    diltiazem (CARDIZEM CD) 180 MG 24 hr capsule  Daily     07/08/19 1702           Noemi Chapel, MD 07/08/19 1702

## 2019-07-08 NOTE — Telephone Encounter (Signed)
[name] and her sister stopped by to talk with Dr Moshe Cipro, however Dr Moshe Cipro was in [facility], I advised I would send a note to have Dr Moshe Cipro call

## 2019-07-08 NOTE — Telephone Encounter (Signed)
Can do 220 pm tomorrow in [facility]   Zandra Abts MD

## 2019-07-08 NOTE — Telephone Encounter (Signed)
Spoke with pt's daughter. Made her aware of appointment. Pt is being seen in ER.

## 2019-07-08 NOTE — Discharge Instructions (Addendum)
The testing today is unremarkable, thankfully the heart rate is close to where it needs to be.  He will need to start taking the medicine called diltiazem 180 mg a day instead of 120 mg a day.  I have sent this to your pharmacy.  Please see Dr. Harl Bowie at your appointment tomorrow.  ER for worsening symptoms

## 2019-07-08 NOTE — Discharge Instructions (Signed)
Recommending further evaluation and management in the ED for atrial fibrillation with RVR.  Patient and family aware.  Patient and family requests EMS transport to ED.

## 2019-07-09 ENCOUNTER — Telehealth: Payer: Self-pay | Admitting: Internal Medicine

## 2019-07-09 ENCOUNTER — Encounter: Payer: Self-pay | Admitting: Cardiology

## 2019-07-09 ENCOUNTER — Ambulatory Visit (INDEPENDENT_AMBULATORY_CARE_PROVIDER_SITE_OTHER): Payer: Medicare Other | Admitting: Cardiology

## 2019-07-09 ENCOUNTER — Encounter: Payer: Medicare Other | Admitting: *Deleted

## 2019-07-09 ENCOUNTER — Ambulatory Visit: Payer: Medicare Other | Admitting: Family Medicine

## 2019-07-09 VITALS — BP 128/76 | HR 79 | Temp 97.8°F | Ht 66.0 in

## 2019-07-09 DIAGNOSIS — R001 Bradycardia, unspecified: Secondary | ICD-10-CM | POA: Diagnosis not present

## 2019-07-09 DIAGNOSIS — I10 Essential (primary) hypertension: Secondary | ICD-10-CM

## 2019-07-09 DIAGNOSIS — I4891 Unspecified atrial fibrillation: Secondary | ICD-10-CM | POA: Diagnosis not present

## 2019-07-09 NOTE — Telephone Encounter (Signed)
Spoke with [name] and she expressed her displeasure with the fact that her mother's monitor was not transmitting information to the office and the patient was not notified.The patient went into AF with RVR and was hospitalized over the weekend. She contacted [employer] support number and was told today that patient's monitor was not working. Patient's daughter stated that she was upset because she was told that her Mother's device could be interrogated at Dr Nelly Laurence office today and that was not possible after they arrived at the appointment. Daughter was informed that [place] would be contacted and we would arrange for new monitor to be sent to her. Address confirmed.

## 2019-07-09 NOTE — Patient Instructions (Signed)
Medication Instructions:  STOP POTASSIUM   Labwork: NONE  Testing/Procedures: NONE  Follow-Up: Your physician recommends that you schedule a follow-up appointment in: 4 MONTHS    Any Other Special Instructions Will Be Listed Below (If Applicable).  PLEASE CALL Monday WITH UPDATED WEIGHTS/HEART RATES    If you need a refill on your cardiac medications before your next appointment, please call your pharmacy.

## 2019-07-09 NOTE — Telephone Encounter (Signed)
°  1. Has your device fired?   2. Is you device beeping?  3. Are you experiencing draining or swelling at device site?   4. Are you calling to see if we received your device transmission?   5. Have you passed out? No  Patient's daughter called stating that her mother device has not been working since September. She stated she called company said is stop working from September and that we should have notified her. She states the last transmission was sent in August. She is very upset that no one ever called her that to inform her of this.  She wants a call back today.     Please route to [place]

## 2019-07-09 NOTE — Telephone Encounter (Signed)
Spoke with daughterMargarita Grizzle) and explained that patient would need appointment to have industry present to check device and troubleshoot issue with transmission of data. Patient will be seen in [place] 07/15/19 at 31 and family aware that there may be a wait due to patient being worked into schedule. They were appreciative of the appointment in [place] to to patient's age and current health.  Appointment date and time approved by Lynnell Jude NP due to situation and patient is established patient with Dr. Lovena Le.

## 2019-07-09 NOTE — Telephone Encounter (Signed)
Spoke with Barnabas Lister at [employer] and remote monitor device is functional but unable to connect with patient and e-mail was sent to the [facility] representative to determine how to proceed. Spoke with Dionne Milo from [facility] and patient will need an in-person visit in clinic to interrogate device with a industry present to assess situation.

## 2019-07-09 NOTE — Progress Notes (Signed)
Clinical Summary [name] is a 85 y.o.female seen today for follow up of the following medical problems   1. Paroxysmal afib -Has not been interested in NOACs.   - admission 06/2019 with afib with RVR. Was on dilt gtt, converted to oral dilt 120mg  daily and discharged - in general patient has not had any symptoms. The tachycardia was noted by a home vitals check prior to admission with rates 130s - some recurrent asymptomatic tachycardia noted at home, patient brought to ER yesterday. Cardizem increased to 180mg , discharged from ER.       2. Symptomatic bradycardia - normal device check during EP visit 01/2019 and remotely 03/2019 -she denies any recent symptoms  3. HTN - compliant with meds. Recent bp at 03/2019 urgent care visit 120s/80s  HCTZ stopped during 06/2019 admit with hyponatremia    4. Hyperlipidemia - 02/2018 TC 132 HDL 42 TG 195 LDL 63 -she is compliant with statin - family holding off on repeat labs due to covid19 risks    SH: daughter of [name] who is also a patient of mine. She has a great grandsone here with her today started kindergarten, a great grandaughter who is 31 yos   Past Medical History:  Diagnosis Date  . Arthritis   . Gastroesophageal reflux disease   . Hearing impairment   . Hyperlipidemia   . Hypertension   . Hypothyroidism   . Impaired glucose tolerance   . Macular degeneration   . Pancreatitis   . Paroxysmal atrial fibrillation ([place]) 2011  . Sick sinus syndrome ([place]) 2011   Atrial fibrilation 10/2009; and dual chamber pacemaker in 4/11; normal EF  . Sleep apnea    Nocturnal oxygen therapy  . Zoster 2016     No Known Allergies   Current Outpatient Medications  Medication Sig Dispense Refill  . Calcium Carb-Cholecalciferol 500-400 MG-UNIT CHEW Chew 1 tablet by mouth daily.  60 tablet   . diltiazem (CARDIZEM CD) 180 MG 24 hr capsule Take 1 capsule (180 mg total) by mouth daily. 30 capsule 1  .  fluticasone (FLONASE) 50 MCG/ACT nasal spray Place 1 spray into both nostrils daily.    Marland Kitchen levothyroxine (SYNTHROID) 112 MCG tablet TAKE 1 TABLET BY MOUTH ONCE DAILY. (Patient taking differently: Take 112 mcg by mouth daily before breakfast. ) 90 tablet 1  . lovastatin (MEVACOR) 40 MG tablet TAKE ONE TABLET BY MOUTH DAILY. (Patient taking differently: Take 40 mg by mouth at bedtime. ) 30 tablet 6  . meclizine (ANTIVERT) 12.5 MG tablet Take 1 tablet (12.5 mg total) by mouth 2 (two) times daily as needed for dizziness. 30 tablet 0  . metoprolol tartrate (LOPRESSOR) 100 MG tablet TAKE 1 TABLET BY MOUTH TWICE A DAY. (Patient taking differently: Take 100 mg by mouth 2 (two) times daily. ) 180 tablet 3  . Multiple Vitamins-Minerals (EYE VITAMINS & MINERALS PO) Take 1 tablet by mouth daily.    Marland Kitchen omeprazole (PRILOSEC) 40 MG capsule TAKE (1) CAPSULE BY MOUTH ONCE DAILY FOR ACID REFLUX. (Patient taking differently: Take 40 mg by mouth every morning. ) 30 capsule 0  . OXYGEN-HELIUM IN Inhale 2 L into the lungs at bedtime.    . Potassium 99 MG TABS Take 1 tablet by mouth at bedtime.     Marland Kitchen warfarin (COUMADIN) 5 MG tablet TAKE 1/2 TABLET BY MOUTH DAILY EXCEPT 1 TABLET ON WEDNESDAYS & SATURDAYS OR AS DIRECTED. (Patient taking differently: Take 2.5-5 mg by mouth See admin instructions.  TAKE 1/2 TABLET BY MOUTH DAILY EXCEPT 1 TABLET ON WEDNESDAYS & SATURDAYS OR AS DIRECTED.) 25 tablet 2   No current facility-administered medications for this visit.      Past Surgical History:  Procedure Laterality Date  . ABDOMINAL HYSTERECTOMY    . CATARACT EXTRACTION     Bilateral  . COLONOSCOPY  2009   Dr. Gala Romney  . EYE SURGERY     laser   . PACEMAKER INSERTION  2011   dual chamber   . PPM GENERATOR CHANGEOUT N/A 11/13/2016   Procedure: PPM Generator Changeout;  Surgeon: Evans Lance, MD;  Location: [facility];  Service: Cardiovascular;  Laterality: N/A;  . SALPINGOOPHORECTOMY  1970   right     No Known  Allergies    Family History  Problem Relation Age of Onset  . Cancer Mother        gential  . Cancer Father        throat   . Pneumonia Sister   . Emphysema Sister   . Mitral valve prolapse Sister   . Heart disease Other   . Arthritis Other   . Lung disease Other   . Asthma Other   . Melanoma Son      Social History Ms. Simm reports that she has never smoked. She has never used smokeless tobacco. Ms. Grossman reports no history of alcohol use.   Review of Systems CONSTITUTIONAL: No weight loss, fever, chills, weakness or fatigue.  HEENT: Eyes: No visual loss, blurred vision, double vision or yellow sclerae.No hearing loss, sneezing, congestion, runny nose or sore throat.  SKIN: No rash or itching.  CARDIOVASCULAR: per hpi RESPIRATORY: No shortness of breath, cough or sputum.  GASTROINTESTINAL: No anorexia, nausea, vomiting or diarrhea. No abdominal pain or blood.  GENITOURINARY: No burning on urination, no polyuria NEUROLOGICAL: No headache, dizziness, syncope, paralysis, ataxia, numbness or tingling in the extremities. No change in bowel or bladder control.  MUSCULOSKELETAL: No muscle, back pain, joint pain or stiffness.  LYMPHATICS: No enlarged nodes. No history of splenectomy.  PSYCHIATRIC: No history of depression or anxiety.  ENDOCRINOLOGIC: No reports of sweating, cold or heat intolerance. No polyuria or polydipsia.  Marland Kitchen   Physical Examination Today's Vitals   07/09/19 1424  BP: 128/76  Pulse: 79  Temp: 97.8 F (36.6 C)  SpO2: 97%  Height: 5\' 6"  (1.676 m)   Body mass index is 26.83 kg/m.  Gen: resting comfortably, no acute distress HEENT: no scleral icterus, pupils equal round and reactive, no palptable cervical adenopathy,  CV: RRR, no m/r/g, no jvd Resp: Clear to auscultation bilaterally GI: abdomen is soft, non-tender, non-distended, normal bowel sounds, no hepatosplenomegaly MSK: extremities are warm, no edema.  Skin: warm, no rash Neuro:  no focal  deficits Psych: appropriate affect   Diagnostic Studies 07/04/19 echo IMPRESSIONS    1. Left ventricular ejection fraction, by visual estimation, is 65 to 70%. The left ventricle has normal function. There is mildly increased left ventricular hypertrophy.  2. Left ventricular diastolic parameters are indeterminate in the setting of atrial fibrillation.  3. Global right ventricle has normal systolic function.The right ventricular size is mildly enlarged. No increase in right ventricular wall thickness.  4. Left atrial size was upper normal.  5. Right atrial size was upper normal.  6. Mild mitral annular calcification.  7. Moderate aortic valve annular calcification.  8. The mitral valve is grossly normal. Mild mitral valve regurgitation.  9. The tricuspid valve is grossly normal.  Tricuspid valve regurgitation is trivial. 10. The aortic valve is tricuspid. Aortic valve regurgitation is not visualized. Mild aortic valve sclerosis without stenosis. 11. The pulmonic valve was grossly normal. Pulmonic valve regurgitation is trivial. 12. A pacer wire is visualized in the RV and RA. 13. The inferior vena cava is normal in size with greater than 50% respiratory variability, suggesting right atrial pressure of 3 mmHg. 14. Mildly elevated pulmonary artery systolic pressure. 15. The tricuspid regurgitant velocity is 2.71 m/s, and with an assumed right atrial pressure of 3 mmHg, the estimated right ventricular systolic pressure is mildly elevated at 32.4 mmHg.    Assessment and Plan  1. Afib - doing well with recent changes, room to titrate dilt further if needed. Continue current meds  2. HTN -at goal, continue current meds - HCTZ stopped during recent admission due to hyponatremia, will stop potassium since off diuretic  3. Symptomatic bradycardia/Pacemaker - no symptoms, continue to follow with EP and device clinic      Arnoldo Lenis, M.D.

## 2019-07-09 NOTE — Telephone Encounter (Signed)
Pls call daughter , apologize, I was unable to contact her yesterday. I have reviewed charyt and see that she has cardiology f/u this morning and actually was hospitalized recently for a fib, I will call this pm, let her know. I also see she has appt with [name] today for [facility] so that will be useful

## 2019-07-10 ENCOUNTER — Telehealth: Payer: Self-pay

## 2019-07-10 DIAGNOSIS — Z9181 History of falling: Secondary | ICD-10-CM | POA: Diagnosis not present

## 2019-07-10 DIAGNOSIS — H3530 Unspecified macular degeneration: Secondary | ICD-10-CM | POA: Diagnosis not present

## 2019-07-10 DIAGNOSIS — E039 Hypothyroidism, unspecified: Secondary | ICD-10-CM | POA: Diagnosis not present

## 2019-07-10 DIAGNOSIS — I480 Paroxysmal atrial fibrillation: Secondary | ICD-10-CM | POA: Diagnosis not present

## 2019-07-10 DIAGNOSIS — E785 Hyperlipidemia, unspecified: Secondary | ICD-10-CM | POA: Diagnosis not present

## 2019-07-10 DIAGNOSIS — I10 Essential (primary) hypertension: Secondary | ICD-10-CM | POA: Diagnosis not present

## 2019-07-10 DIAGNOSIS — I495 Sick sinus syndrome: Secondary | ICD-10-CM | POA: Diagnosis not present

## 2019-07-10 DIAGNOSIS — Z7901 Long term (current) use of anticoagulants: Secondary | ICD-10-CM | POA: Diagnosis not present

## 2019-07-10 DIAGNOSIS — K219 Gastro-esophageal reflux disease without esophagitis: Secondary | ICD-10-CM | POA: Diagnosis not present

## 2019-07-10 DIAGNOSIS — Z950 Presence of cardiac pacemaker: Secondary | ICD-10-CM | POA: Diagnosis not present

## 2019-07-10 DIAGNOSIS — R42 Dizziness and giddiness: Secondary | ICD-10-CM | POA: Diagnosis not present

## 2019-07-10 DIAGNOSIS — M1990 Unspecified osteoarthritis, unspecified site: Secondary | ICD-10-CM | POA: Diagnosis not present

## 2019-07-10 DIAGNOSIS — H9190 Unspecified hearing loss, unspecified ear: Secondary | ICD-10-CM | POA: Diagnosis not present

## 2019-07-10 NOTE — Telephone Encounter (Signed)
Left message for patient to remind of missed remote transmission. LL 

## 2019-07-11 ENCOUNTER — Ambulatory Visit: Payer: Medicare Other | Admitting: Cardiology

## 2019-07-11 ENCOUNTER — Telehealth: Payer: Self-pay

## 2019-07-11 DIAGNOSIS — H9190 Unspecified hearing loss, unspecified ear: Secondary | ICD-10-CM | POA: Diagnosis not present

## 2019-07-11 DIAGNOSIS — Z7901 Long term (current) use of anticoagulants: Secondary | ICD-10-CM | POA: Diagnosis not present

## 2019-07-11 DIAGNOSIS — Z950 Presence of cardiac pacemaker: Secondary | ICD-10-CM | POA: Diagnosis not present

## 2019-07-11 DIAGNOSIS — M1990 Unspecified osteoarthritis, unspecified site: Secondary | ICD-10-CM | POA: Diagnosis not present

## 2019-07-11 DIAGNOSIS — K219 Gastro-esophageal reflux disease without esophagitis: Secondary | ICD-10-CM | POA: Diagnosis not present

## 2019-07-11 DIAGNOSIS — I10 Essential (primary) hypertension: Secondary | ICD-10-CM | POA: Diagnosis not present

## 2019-07-11 DIAGNOSIS — H3530 Unspecified macular degeneration: Secondary | ICD-10-CM | POA: Diagnosis not present

## 2019-07-11 DIAGNOSIS — I495 Sick sinus syndrome: Secondary | ICD-10-CM | POA: Diagnosis not present

## 2019-07-11 DIAGNOSIS — E039 Hypothyroidism, unspecified: Secondary | ICD-10-CM | POA: Diagnosis not present

## 2019-07-11 DIAGNOSIS — R42 Dizziness and giddiness: Secondary | ICD-10-CM | POA: Diagnosis not present

## 2019-07-11 DIAGNOSIS — E785 Hyperlipidemia, unspecified: Secondary | ICD-10-CM | POA: Diagnosis not present

## 2019-07-11 DIAGNOSIS — I480 Paroxysmal atrial fibrillation: Secondary | ICD-10-CM | POA: Diagnosis not present

## 2019-07-11 DIAGNOSIS — Z9181 History of falling: Secondary | ICD-10-CM | POA: Diagnosis not present

## 2019-07-11 MED ORDER — FUROSEMIDE 40 MG PO TABS: ORAL_TABLET | ORAL | 3 refills | Status: DC

## 2019-07-11 NOTE — Telephone Encounter (Signed)
New Message     Pt c/o swelling: STAT is pt has developed SOB within 24 hours  1) How much weight have you gained and in what time span?  no  2) If swelling, where is the swelling located? Left leg , lower leg and ankle   3) Are you currently taking a fluid pill? Has HTZ  4) Are you currently SOB?  no  5) Do you have a log of your daily weights (if so, list)? No  6) Have you gained 3 pounds in a day or 5 pounds in a week? no  7) Have you traveled recently? No   Patient started  diltiazem (CARDIZEM CD) 180 MG 24 hr capsule Take 1 capsule (180 mg total) by mouth daily.    and she is having swelling, wants to know how much HTZ should she take.   Patients bp is staying between 165-80 to 144/80 (within last half hour)  HR is 100-89 fluctuating.

## 2019-07-11 NOTE — Telephone Encounter (Signed)
2nd call has not heard anthing back about taking a fluid pill?

## 2019-07-11 NOTE — Telephone Encounter (Signed)
Should be off HCTZ, this was stopped during recent admission. Can start lasix 40mg  just prn leg swelling. Emphasize if no significant swelling then not to take lasix. Update [place] again on Monday on swelling, HRs and blood pressures. We may increase dilt further but would like the recent change take affect over the next few days.   Zandra Abts MD

## 2019-07-11 NOTE — Telephone Encounter (Signed)
Pt's daughter made aware, voiced understanding. Sent in RX to [facility].

## 2019-07-14 ENCOUNTER — Telehealth: Payer: Self-pay | Admitting: *Deleted

## 2019-07-14 ENCOUNTER — Other Ambulatory Visit: Payer: Self-pay | Admitting: Family Medicine

## 2019-07-14 ENCOUNTER — Ambulatory Visit
Admission: EM | Admit: 2019-07-14 | Discharge: 2019-07-14 | Disposition: A | Discharge status: Home / Self Care | Payer: Medicare Other | Attending: Emergency Medicine | Admitting: Emergency Medicine

## 2019-07-14 ENCOUNTER — Other Ambulatory Visit: Payer: Self-pay

## 2019-07-14 DIAGNOSIS — N3001 Acute cystitis with hematuria: Secondary | ICD-10-CM

## 2019-07-14 DIAGNOSIS — E039 Hypothyroidism, unspecified: Secondary | ICD-10-CM

## 2019-07-14 DIAGNOSIS — Z950 Presence of cardiac pacemaker: Secondary | ICD-10-CM

## 2019-07-14 DIAGNOSIS — Z9181 History of falling: Secondary | ICD-10-CM

## 2019-07-14 DIAGNOSIS — H9190 Unspecified hearing loss, unspecified ear: Secondary | ICD-10-CM

## 2019-07-14 DIAGNOSIS — R509 Fever, unspecified: Secondary | ICD-10-CM | POA: Diagnosis not present

## 2019-07-14 DIAGNOSIS — M1990 Unspecified osteoarthritis, unspecified site: Secondary | ICD-10-CM

## 2019-07-14 DIAGNOSIS — H3530 Unspecified macular degeneration: Secondary | ICD-10-CM

## 2019-07-14 DIAGNOSIS — I480 Paroxysmal atrial fibrillation: Secondary | ICD-10-CM | POA: Diagnosis not present

## 2019-07-14 DIAGNOSIS — Z20828 Contact with and (suspected) exposure to other viral communicable diseases: Secondary | ICD-10-CM | POA: Insufficient documentation

## 2019-07-14 DIAGNOSIS — J3489 Other specified disorders of nose and nasal sinuses: Secondary | ICD-10-CM | POA: Diagnosis not present

## 2019-07-14 DIAGNOSIS — K219 Gastro-esophageal reflux disease without esophagitis: Secondary | ICD-10-CM

## 2019-07-14 DIAGNOSIS — R42 Dizziness and giddiness: Secondary | ICD-10-CM | POA: Diagnosis not present

## 2019-07-14 DIAGNOSIS — I10 Essential (primary) hypertension: Secondary | ICD-10-CM | POA: Diagnosis not present

## 2019-07-14 DIAGNOSIS — Z7901 Long term (current) use of anticoagulants: Secondary | ICD-10-CM

## 2019-07-14 DIAGNOSIS — Z20822 Contact with and (suspected) exposure to covid-19: Secondary | ICD-10-CM

## 2019-07-14 DIAGNOSIS — E785 Hyperlipidemia, unspecified: Secondary | ICD-10-CM | POA: Diagnosis not present

## 2019-07-14 DIAGNOSIS — I495 Sick sinus syndrome: Secondary | ICD-10-CM | POA: Diagnosis not present

## 2019-07-14 LAB — POCT URINALYSIS DIP (MANUAL ENTRY)
Bilirubin, UA: NEGATIVE
Glucose, UA: NEGATIVE mg/dL
Ketones, POC UA: NEGATIVE mg/dL
Nitrite, UA: POSITIVE — AB
Protein Ur, POC: 30 mg/dL — AB
Spec Grav, UA: 1.02 (ref 1.010–1.025)
Urobilinogen, UA: 1 E.U./dL
pH, UA: 7 (ref 5.0–8.0)

## 2019-07-14 MED ORDER — CEPHALEXIN 500 MG PO CAPS: 500.0000 mg | ORAL_CAPSULE | Freq: Two times a day (BID) | ORAL | 0 refills | Status: AC

## 2019-07-14 NOTE — ED Triage Notes (Signed)
Pt presents with dysuria and low grade  fever for past couple days pt also having sinus symptoms and requesting covid test

## 2019-07-14 NOTE — Telephone Encounter (Signed)
Pts daughter [name] called said her mother was having a low grade fever ear pain and uti symptoms as she was burning and hurting. After speaking with Brandi I advised pt to go to the urgent care as we were booked with no work ins.

## 2019-07-14 NOTE — Telephone Encounter (Signed)
Otila Kluver an RN with [facility] was sent to see Nakeita and she needs verbal orders to see pt for a couple of weeks she can be reached at [phone]

## 2019-07-14 NOTE — Discharge Instructions (Signed)
UTI: Urine did show signs of infection Urine culture sent.  We will call you with abnormal results.   Push fluids and get plenty of rest.   Take antibiotic as directed and to completion Follow up with PCP next week to ensure symptoms are improving Go to ER if you have any new or worsening symptoms such as fever, worsening abdominal pain, nausea/vomiting, flank pain, etc...  COVID symptoms:  COVID testing ordered.  It will take between 5-7 days for test results.  Someone will contact you regarding abnormal results.    In the meantime: You should remain isolated in your home for 10 days from symptom onset AND greater than 72 hours after symptoms resolution (absence of fever without the use of fever-reducing medication and improvement in respiratory symptoms), whichever is longer Get plenty of rest and push fluids Use OTC flonase and/or zyrtec daily for runny nose, congestion, sore throat, and/or cough Use OTC medications like ibuprofen or tylenol as needed fever or pain Follow up with PCP this week via phone or televisit for recheck and to ensure your symptoms are improving Go to the ED if you have any new or worsening symptoms such as fever, worsening cough, shortness of breath, chest tightness, chest pain, turning blue, changes in mental status, etc..Marland Kitchen

## 2019-07-14 NOTE — Telephone Encounter (Signed)
Called pt on 11/11 no answer. [name] spoke with patient today

## 2019-07-14 NOTE — ED Provider Notes (Addendum)
[facility]   CC: UTI; covid test  SUBJECTIVE:  [name] is a 85 y.o. female who complains of dysuria and low grade fever x 1 week, with worsening symptoms for the past 2 days.  Was seen in the ED on 07/08/2019 for atrial fibrillation with RVR. Otherwise denies delayed bathroom breaks, or decrease water intake.  Has NOT tried OTC medications.  Symptoms are made worse with urination.  Admits to similar symptoms in the past related to UTI.  Denies chills, nausea, vomiting, abdominal pain, flank pain, hematuria.    Patient also reports runny nose, and congestion since the end of October.  Was treated with penicillin and azithromycin by PCP around that time, reports mild to no improvement with medications.  Was in the ED on 07/08/2019.  Daughters and patient concerned for COVID exposure.  Has not been taking OTC medications.  Denies aggravating factors.  Complains of low grade fever, and left sided ear pressure.  Denies cough, nausea, vomiting, or changes in bowel habits.    LMP: No LMP recorded. Patient is postmenopausal.  ROS: As in HPI.  All other pertinent ROS negative.     Past Medical History:  Diagnosis Date  . Arthritis   . Gastroesophageal reflux disease   . Hearing impairment   . Hyperlipidemia   . Hypertension   . Hypothyroidism   . Impaired glucose tolerance   . Macular degeneration   . Pancreatitis   . Paroxysmal atrial fibrillation ([place]) 2011  . Sick sinus syndrome ([place]) 2011   Atrial fibrilation 10/2009; and dual chamber pacemaker in 4/11; normal EF  . Sleep apnea    Nocturnal oxygen therapy  . Zoster 2016   Past Surgical History:  Procedure Laterality Date  . ABDOMINAL HYSTERECTOMY    . CATARACT EXTRACTION     Bilateral  . COLONOSCOPY  2009   Dr. Gala Romney  . EYE SURGERY     laser   . PACEMAKER INSERTION  2011   dual chamber   . PPM GENERATOR CHANGEOUT N/A 11/13/2016   Procedure: PPM Generator Changeout;  Surgeon: Evans Lance, MD;  Location: [facility];  Service: Cardiovascular;  Laterality: N/A;  . SALPINGOOPHORECTOMY  1970   right   No Known Allergies No current facility-administered medications on file prior to encounter.    Current Outpatient Medications on File Prior to Encounter  Medication Sig Dispense Refill  . Calcium Carb-Cholecalciferol 500-400 MG-UNIT CHEW Chew 1 tablet by mouth daily.  60 tablet   . diltiazem (CARDIZEM CD) 180 MG 24 hr capsule Take 1 capsule (180 mg total) by mouth daily. 30 capsule 1  . fluticasone (FLONASE) 50 MCG/ACT nasal spray Place 1 spray into both nostrils daily.    . furosemide (LASIX) 40 MG tablet Take 40 mg daily as needed only when there is significant swelling in legs. 30 tablet 3  . levothyroxine (SYNTHROID) 112 MCG tablet TAKE 1 TABLET BY MOUTH ONCE DAILY. (Patient taking differently: Take 112 mcg by mouth daily before breakfast. ) 90 tablet 1  . lovastatin (MEVACOR) 40 MG tablet TAKE ONE TABLET BY MOUTH DAILY. (Patient taking differently: Take 40 mg by mouth at bedtime. ) 30 tablet 6  . meclizine (ANTIVERT) 12.5 MG tablet Take 1 tablet (12.5 mg total) by mouth 2 (two) times daily as needed for dizziness. 30 tablet 0  . metoprolol tartrate (LOPRESSOR) 100 MG tablet TAKE 1 TABLET BY MOUTH TWICE A DAY. (Patient taking differently: Take 100 mg by mouth  2 (two) times daily. ) 180 tablet 3  . Multiple Vitamins-Minerals (EYE VITAMINS & MINERALS PO) Take 1 tablet by mouth daily.    Marland Kitchen omeprazole (PRILOSEC) 40 MG capsule TAKE (1) CAPSULE BY MOUTH ONCE DAILY FOR ACID REFLUX. (Patient taking differently: Take 40 mg by mouth every morning. ) 30 capsule 0  . OXYGEN-HELIUM IN Inhale 2 L into the lungs at bedtime.    Marland Kitchen warfarin (COUMADIN) 5 MG tablet TAKE 1/2 TABLET BY MOUTH DAILY EXCEPT 1 TABLET ON WEDNESDAYS & SATURDAYS OR AS DIRECTED. (Patient taking differently: Take 2.5-5 mg by mouth See admin instructions. TAKE 1/2 TABLET BY MOUTH DAILY EXCEPT 1 TABLET ON WEDNESDAYS & SATURDAYS OR AS  DIRECTED.) 25 tablet 2  . [DISCONTINUED] benzonatate (TESSALON PERLES) 100 MG capsule Take 1 capsule (100 mg total) by mouth every 6 (six) hours as needed for cough. 30 capsule 0   Social History   Socioeconomic History  . Marital status: Widowed    Spouse name: Not on file  . Number of children: 7  . Years of education: college  . Highest education level: Not on file  Occupational History  . Occupation: retired     Fish farm manager: RETIRED  Social Needs  . Financial resource strain: Not very hard  . Food insecurity    Worry: Never true    Inability: Never true  . Transportation needs    Medical: No    Non-medical: No  Tobacco Use  . Smoking status: Never Smoker  . Smokeless tobacco: Never Used  Substance and Sexual Activity  . Alcohol use: No    Alcohol/week: 0.0 standard drinks  . Drug use: No  . Sexual activity: Not Currently  Lifestyle  . Physical activity    Days per week: 2 days    Minutes per session: 30 min  . Stress: Not at all  Relationships  . Social connections    Talks on phone: More than three times a week    Gets together: Once a week    Attends religious service: Never    Active member of club or organization: No    Attends meetings of clubs or organizations: Never    Relationship status: Widowed  . Intimate partner violence    Fear of current or ex partner: No    Emotionally abused: No    Physically abused: No    Forced sexual activity: No  Other Topics Concern  . Not on file  Social History Narrative  . Not on file   Family History  Problem Relation Age of Onset  . Cancer Mother        gential  . Cancer Father        throat   . Pneumonia Sister   . Emphysema Sister   . Mitral valve prolapse Sister   . Heart disease Other   . Arthritis Other   . Lung disease Other   . Asthma Other   . Melanoma Son     OBJECTIVE:  Vitals:   07/14/19 1149  BP: (!) 157/76  Pulse: 87  Resp: 18  Temp: 97.9 F (36.6 C)  SpO2: 97%   General  appearance: Alert in no acute distress HEENT: NCAT. PERRL, EOMI grossly; EACs clear, TMs pearly gray; nares patent without rhinorrhea; no teeth, Oropharynx clear.  Lungs: clear to auscultation bilaterally without adventitious breath sounds Heart: regular rate and rhythm.   Abdomen: soft; non-distended; no tenderness; bowel sounds present; no guarding Back: no CVA tenderness Extremities: no edema; symmetrical  with no gross deformities Skin: warm and dry Neurologic: Ambulates from chair to exam table without difficulty Psychological: alert and cooperative; normal mood and affect  Labs Reviewed  POCT URINALYSIS DIP (MANUAL ENTRY) - Abnormal; Notable for the following components:      Result Value   Color, UA straw (*)    Clarity, UA cloudy (*)    Blood, UA moderate (*)    Protein Ur, POC =30 (*)    Nitrite, UA Positive (*)    Leukocytes, UA Small (1+) (*)    All other components within normal limits  NOVEL CORONAVIRUS, NAA  URINE CULTURE    ASSESSMENT & PLAN:  1. Fever, unspecified   2. Acute cystitis with hematuria   3. Suspected COVID-19 virus infection   4. Viral URI     Meds ordered this encounter  Medications  . cephALEXin (KEFLEX) 500 MG capsule    Sig: Take 1 capsule (500 mg total) by mouth 2 (two) times daily for 10 days.    Dispense:  20 capsule    Refill:  0    Order Specific Question:   Supervising Provider    Answer:   Raylene Everts [id number]   UTI: Urine did show signs of infection Urine culture sent.  We will call you with abnormal results.   Push fluids and get plenty of rest.   Take antibiotic as directed and to completion Follow up with PCP next week to ensure symptoms are improving Go to ER if you have any new or worsening symptoms such as fever, worsening abdominal pain, nausea/vomiting, flank pain, etc...  COVID symptoms:  COVID testing ordered.  It will take between 5-7 days for test results.  Someone will contact you regarding abnormal  results.    In the meantime: You should remain isolated in your home for 10 days from symptom onset AND greater than 72 hours after symptoms resolution (absence of fever without the use of fever-reducing medication and improvement in respiratory symptoms), whichever is longer Get plenty of rest and push fluids Use OTC flonase and/or zyrtec daily for runny nose, congestion, sore throat, and/or cough Use OTC medications like ibuprofen or tylenol as needed fever or pain Follow up with PCP this week via phone or televisit for recheck and to ensure your symptoms are improving Go to the ED if you have any new or worsening symptoms such as fever, worsening cough, shortness of breath, chest tightness, chest pain, turning blue, changes in mental status, etc...   Outlined signs and symptoms indicating need for more acute intervention. Patient verbalized understanding. After Visit Summary given.   Greater than 45 minutes spent discussing patient diagnoses, treatment, and plan with patient and daughters.  All questions answered.  Patient and daughters in agreement with above plan.      Lestine Box, PA-C 07/14/19 1239

## 2019-07-14 NOTE — Telephone Encounter (Signed)
Verbal orders given  

## 2019-07-15 ENCOUNTER — Telehealth: Payer: Self-pay | Admitting: *Deleted

## 2019-07-15 ENCOUNTER — Ambulatory Visit (INDEPENDENT_AMBULATORY_CARE_PROVIDER_SITE_OTHER): Payer: Medicare Other | Admitting: Internal Medicine

## 2019-07-15 ENCOUNTER — Encounter: Payer: Self-pay | Admitting: Internal Medicine

## 2019-07-15 VITALS — BP 144/78 | HR 70 | Temp 96.9°F | Ht 66.0 in | Wt 167.0 lb

## 2019-07-15 DIAGNOSIS — I10 Essential (primary) hypertension: Secondary | ICD-10-CM

## 2019-07-15 DIAGNOSIS — I482 Chronic atrial fibrillation, unspecified: Secondary | ICD-10-CM

## 2019-07-15 MED ORDER — DILTIAZEM HCL ER COATED BEADS 240 MG PO CP24: 240.0000 mg | ORAL_CAPSULE | Freq: Every day | ORAL | 11 refills | Status: DC

## 2019-07-15 NOTE — Progress Notes (Signed)
HPI [name] returns today for followup. She is a pleasant 85 yo woman with chronic atrial fib, CHB, s/p PPM insertion who has had falls, and peripheral edema. She denies sob. She is sedentary.  No Known Allergies   Current Outpatient Medications  Medication Sig Dispense Refill   Calcium Carb-Cholecalciferol 500-400 MG-UNIT CHEW Chew 1 tablet by mouth daily.  60 tablet    cephALEXin (KEFLEX) 500 MG capsule Take 1 capsule (500 mg total) by mouth 2 (two) times daily for 10 days. 20 capsule 0   fluticasone (FLONASE) 50 MCG/ACT nasal spray Place 1 spray into both nostrils daily.     furosemide (LASIX) 40 MG tablet Take 40 mg daily as needed only when there is significant swelling in legs. 30 tablet 3   levothyroxine (SYNTHROID) 112 MCG tablet TAKE 1 TABLET BY MOUTH ONCE DAILY. (Patient taking differently: Take 112 mcg by mouth daily before breakfast. ) 90 tablet 1   lovastatin (MEVACOR) 40 MG tablet TAKE ONE TABLET BY MOUTH DAILY. (Patient taking differently: Take 40 mg by mouth at bedtime. ) 30 tablet 6   meclizine (ANTIVERT) 12.5 MG tablet Take 1 tablet (12.5 mg total) by mouth 2 (two) times daily as needed for dizziness. 30 tablet 0   metoprolol tartrate (LOPRESSOR) 100 MG tablet TAKE 1 TABLET BY MOUTH TWICE A DAY. (Patient taking differently: Take 100 mg by mouth 2 (two) times daily. ) 180 tablet 3   Multiple Vitamins-Minerals (EYE VITAMINS & MINERALS PO) Take 1 tablet by mouth daily.     omeprazole (PRILOSEC) 40 MG capsule TAKE (1) CAPSULE BY MOUTH ONCE DAILY FOR ACID REFLUX. 30 capsule 0   OXYGEN-HELIUM IN Inhale 2 L into the lungs at bedtime.     warfarin (COUMADIN) 5 MG tablet TAKE 1/2 TABLET BY MOUTH DAILY EXCEPT 1 TABLET ON WEDNESDAYS & SATURDAYS OR AS DIRECTED. (Patient taking differently: Take 2.5-5 mg by mouth See admin instructions. TAKE 1/2 TABLET BY MOUTH DAILY EXCEPT 1 TABLET ON WEDNESDAYS & SATURDAYS OR AS DIRECTED.) 25 tablet 2   diltiazem (CARDIZEM CD) 240  MG 24 hr capsule Take 1 capsule (240 mg total) by mouth daily. 30 capsule 11   No current facility-administered medications for this visit.      Past Medical History:  Diagnosis Date   Arthritis    Gastroesophageal reflux disease    Hearing impairment    Hyperlipidemia    Hypertension    Hypothyroidism    Impaired glucose tolerance    Macular degeneration    Pancreatitis    Paroxysmal atrial fibrillation ([place]) 2011   Sick sinus syndrome ([place]) 2011   Atrial fibrilation 10/2009; and dual chamber pacemaker in 4/11; normal EF   Sleep apnea    Nocturnal oxygen therapy   [place] 2016    ROS:   All systems reviewed and negative except as noted in the HPI.   Past Surgical History:  Procedure Laterality Date   ABDOMINAL HYSTERECTOMY     CATARACT EXTRACTION     Bilateral   COLONOSCOPY  2009   Dr. Gala Romney   EYE SURGERY     laser    PACEMAKER INSERTION  2011   dual chamber    PPM GENERATOR CHANGEOUT N/A 11/13/2016   Procedure: PPM Generator Changeout;  Surgeon: Evans Lance, MD;  Location: [facility];  Service: Cardiovascular;  Laterality: N/A;   SALPINGOOPHORECTOMY  1970   right     Family History  Problem Relation Age  of Onset   Cancer Mother        gential   Cancer Father        throat    Pneumonia Sister    Emphysema Sister    Mitral valve prolapse Sister    Heart disease Other    Arthritis Other    Lung disease Other    Asthma Other    Melanoma Son      Social History   Socioeconomic History   Marital status: Widowed    Spouse name: Not on file   Number of children: 7   Years of education: college   Highest education level: Not on file  Occupational History   Occupation: retired     Fish farm manager: RETIRED  Scientist, 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   Food insecurity    Worry: Never true    Inability: Never true   Transportation needs    Medical: No    Non-medical: No  Tobacco Use    Smoking status: Never Smoker   Smokeless tobacco: Never Used  Substance and Sexual Activity   Alcohol use: No    Alcohol/week: 0.0 standard drinks   Drug use: No   Sexual activity: Not Currently  Lifestyle   Physical activity    Days per week: 2 days    Minutes per session: 30 min   Stress: Not at all  Relationships   Social connections    Talks on phone: More than three times a week    Gets together: Once a week    Attends religious service: Never    Active member of club or organization: No    Attends meetings of clubs or organizations: Never    Relationship status: Widowed   Intimate partner violence    Fear of current or ex partner: No    Emotionally abused: No    Physically abused: No    Forced sexual activity: No  Other Topics Concern   Not on file  Social History Narrative   Not on file     BP (!) 144/78    Pulse 70    Temp (!) 96.9 F (36.1 C) (Temporal)    Ht 5\' 6"  (1.676 m)    Wt 167 lb (75.8 kg)    SpO2 98%    BMI 26.95 kg/m   Physical Exam:  Chronic, elderly appearing 85 yo woman, NAD HEENT: Unremarkable Neck:  6 cm JVD, no thyromegally Lymphatics:  No adenopathy Back:  No CVA tenderness Lungs:  Clear with no wheezes HEART:  Regular rate rhythm, no murmurs, no rubs, no clicks Abd:  soft, positive bowel sounds, no organomegally, no rebound, no guarding Ext:  2 plus pulses, 1+ edema, no cyanosis, no clubbing Skin:  No rashes no nodules Neuro:  CN II through XII intact, motor grossly intact  DEVICE  Normal device function.  See PaceArt for details.   Assess/Plan: 1. Atrial fib - her VR is controlled. She will continue her current meds. 2. Falls - I am worried about continued use of coumadin. She however has not hurt herself. I would cautiously suggest she continue coumadin. 3. HTN - her SBP is up a bit but not too bad.  4. PPM - her St. Jude single chamber PPM is working normally. 10 years of battery longevity.  [name].D.

## 2019-07-15 NOTE — Patient Instructions (Signed)
Medication Instructions:  Your physician has recommended you make the following change in your medication:  Increase Diltiazem CD to 240 mg Daily   *If you need a refill on your cardiac medications before your next appointment, please call your pharmacy*  Lab Work: NONE   If you have labs (blood work) drawn today and your tests are completely normal, you will receive your results only by: Marland Kitchen MyChart Message (if you have MyChart) OR . A paper copy in the mail If you have any lab test that is abnormal or we need to change your treatment, we will call you to review the results.  Testing/Procedures: NONE   Follow-Up: At [facility], you and your health needs are our priority.  As part of our continuing mission to provide you with exceptional heart care, we have created designated Provider Care Teams.  These Care Teams include your primary Cardiologist (physician) and Advanced Practice Providers (APPs -  Physician Assistants and Nurse Practitioners) who all work together to provide you with the care you need, when you need it.  Your next appointment:   12 months  The format for your next appointment:   In Person  Provider:   Cristopher Peru, MD  Other Instructions Thank you for choosing [facility]!

## 2019-07-15 NOTE — Telephone Encounter (Signed)
Pts daughter Cecille Rubin wanted to see if pt could get something for anxiety since she has been falling she has been really anxious. The heart doctor basically told her there was nothing they could do for her so that is making her anxious also.

## 2019-07-16 ENCOUNTER — Other Ambulatory Visit: Payer: Self-pay | Admitting: Family Medicine

## 2019-07-16 ENCOUNTER — Other Ambulatory Visit: Payer: Self-pay

## 2019-07-16 ENCOUNTER — Telehealth (HOSPITAL_COMMUNITY): Payer: Self-pay | Admitting: Emergency Medicine

## 2019-07-16 ENCOUNTER — Ambulatory Visit (INDEPENDENT_AMBULATORY_CARE_PROVIDER_SITE_OTHER): Payer: Medicare Other | Admitting: *Deleted

## 2019-07-16 DIAGNOSIS — I480 Paroxysmal atrial fibrillation: Secondary | ICD-10-CM | POA: Diagnosis not present

## 2019-07-16 DIAGNOSIS — Z5181 Encounter for therapeutic drug level monitoring: Secondary | ICD-10-CM | POA: Diagnosis not present

## 2019-07-16 LAB — URINE CULTURE: Culture: >=100000 — AB

## 2019-07-16 LAB — NOVEL CORONAVIRUS, NAA: SARS-CoV-2, NAA: NOT DETECTED

## 2019-07-16 LAB — POCT INR: INR: 2.7 (ref 2.0–3.0)

## 2019-07-16 MED ORDER — NITROFURANTOIN MONOHYD MACRO 100 MG PO CAPS: 100.0000 mg | ORAL_CAPSULE | Freq: Two times a day (BID) | ORAL | 0 refills | Status: DC

## 2019-07-16 MED ORDER — BUSPIRONE HCL 5 MG PO TABS: ORAL_TABLET | ORAL | 1 refills | Status: DC

## 2019-07-16 NOTE — Telephone Encounter (Signed)
I tried returning call thios am, left a message to call office. I have prescribed buspar, please let her know

## 2019-07-16 NOTE — Telephone Encounter (Signed)
Urine culture was positive for e coli resistant to keflex given at urgent care visit. Prescription for macrobid bid x5 days per Dr. Alphonzo Cruise sent to pharmacy of choice. Pt called and made aware. Pt educated to follow up if symptoms are not improving. Verbalized understanding.

## 2019-07-16 NOTE — Patient Instructions (Signed)
Continue warfarin 1/2 tablet daily except 1 tablet on Wednesdays and Saturdays.  Recheck in 6 weeks Keep intake of greens consistent

## 2019-07-16 NOTE — Progress Notes (Signed)
klo

## 2019-07-16 NOTE — Telephone Encounter (Signed)
Patient aware (daughter aware)

## 2019-07-18 DIAGNOSIS — I495 Sick sinus syndrome: Secondary | ICD-10-CM | POA: Diagnosis not present

## 2019-07-18 DIAGNOSIS — Z7901 Long term (current) use of anticoagulants: Secondary | ICD-10-CM | POA: Diagnosis not present

## 2019-07-18 DIAGNOSIS — H9190 Unspecified hearing loss, unspecified ear: Secondary | ICD-10-CM | POA: Diagnosis not present

## 2019-07-18 DIAGNOSIS — Z9181 History of falling: Secondary | ICD-10-CM | POA: Diagnosis not present

## 2019-07-18 DIAGNOSIS — I480 Paroxysmal atrial fibrillation: Secondary | ICD-10-CM | POA: Diagnosis not present

## 2019-07-18 DIAGNOSIS — R42 Dizziness and giddiness: Secondary | ICD-10-CM | POA: Diagnosis not present

## 2019-07-18 DIAGNOSIS — M1990 Unspecified osteoarthritis, unspecified site: Secondary | ICD-10-CM | POA: Diagnosis not present

## 2019-07-18 DIAGNOSIS — H3530 Unspecified macular degeneration: Secondary | ICD-10-CM | POA: Diagnosis not present

## 2019-07-18 DIAGNOSIS — E039 Hypothyroidism, unspecified: Secondary | ICD-10-CM | POA: Diagnosis not present

## 2019-07-18 DIAGNOSIS — K219 Gastro-esophageal reflux disease without esophagitis: Secondary | ICD-10-CM | POA: Diagnosis not present

## 2019-07-18 DIAGNOSIS — I10 Essential (primary) hypertension: Secondary | ICD-10-CM | POA: Diagnosis not present

## 2019-07-18 DIAGNOSIS — E785 Hyperlipidemia, unspecified: Secondary | ICD-10-CM | POA: Diagnosis not present

## 2019-07-18 DIAGNOSIS — Z950 Presence of cardiac pacemaker: Secondary | ICD-10-CM | POA: Diagnosis not present

## 2019-07-23 DIAGNOSIS — R42 Dizziness and giddiness: Secondary | ICD-10-CM | POA: Diagnosis not present

## 2019-07-23 DIAGNOSIS — H3530 Unspecified macular degeneration: Secondary | ICD-10-CM | POA: Diagnosis not present

## 2019-07-23 DIAGNOSIS — I480 Paroxysmal atrial fibrillation: Secondary | ICD-10-CM | POA: Diagnosis not present

## 2019-07-23 DIAGNOSIS — E039 Hypothyroidism, unspecified: Secondary | ICD-10-CM | POA: Diagnosis not present

## 2019-07-23 DIAGNOSIS — Z950 Presence of cardiac pacemaker: Secondary | ICD-10-CM | POA: Diagnosis not present

## 2019-07-23 DIAGNOSIS — Z7901 Long term (current) use of anticoagulants: Secondary | ICD-10-CM | POA: Diagnosis not present

## 2019-07-23 DIAGNOSIS — K219 Gastro-esophageal reflux disease without esophagitis: Secondary | ICD-10-CM | POA: Diagnosis not present

## 2019-07-23 DIAGNOSIS — I10 Essential (primary) hypertension: Secondary | ICD-10-CM | POA: Diagnosis not present

## 2019-07-23 DIAGNOSIS — H9190 Unspecified hearing loss, unspecified ear: Secondary | ICD-10-CM | POA: Diagnosis not present

## 2019-07-23 DIAGNOSIS — E785 Hyperlipidemia, unspecified: Secondary | ICD-10-CM | POA: Diagnosis not present

## 2019-07-23 DIAGNOSIS — Z9181 History of falling: Secondary | ICD-10-CM | POA: Diagnosis not present

## 2019-07-23 DIAGNOSIS — I495 Sick sinus syndrome: Secondary | ICD-10-CM | POA: Diagnosis not present

## 2019-07-23 DIAGNOSIS — M1990 Unspecified osteoarthritis, unspecified site: Secondary | ICD-10-CM | POA: Diagnosis not present

## 2019-07-29 ENCOUNTER — Telehealth: Payer: Self-pay | Admitting: *Deleted

## 2019-07-29 DIAGNOSIS — I10 Essential (primary) hypertension: Secondary | ICD-10-CM | POA: Diagnosis not present

## 2019-07-29 DIAGNOSIS — E785 Hyperlipidemia, unspecified: Secondary | ICD-10-CM | POA: Diagnosis not present

## 2019-07-29 DIAGNOSIS — N390 Urinary tract infection, site not specified: Secondary | ICD-10-CM | POA: Diagnosis not present

## 2019-07-29 DIAGNOSIS — G4733 Obstructive sleep apnea (adult) (pediatric): Secondary | ICD-10-CM | POA: Diagnosis not present

## 2019-07-29 DIAGNOSIS — H409 Unspecified glaucoma: Secondary | ICD-10-CM | POA: Diagnosis not present

## 2019-07-29 DIAGNOSIS — E039 Hypothyroidism, unspecified: Secondary | ICD-10-CM | POA: Diagnosis not present

## 2019-07-29 DIAGNOSIS — E871 Hypo-osmolality and hyponatremia: Secondary | ICD-10-CM | POA: Diagnosis not present

## 2019-07-29 DIAGNOSIS — K219 Gastro-esophageal reflux disease without esophagitis: Secondary | ICD-10-CM | POA: Diagnosis not present

## 2019-07-29 DIAGNOSIS — I480 Paroxysmal atrial fibrillation: Secondary | ICD-10-CM | POA: Diagnosis not present

## 2019-07-29 DIAGNOSIS — I495 Sick sinus syndrome: Secondary | ICD-10-CM | POA: Diagnosis not present

## 2019-07-29 DIAGNOSIS — H9190 Unspecified hearing loss, unspecified ear: Secondary | ICD-10-CM | POA: Diagnosis not present

## 2019-07-29 DIAGNOSIS — M1990 Unspecified osteoarthritis, unspecified site: Secondary | ICD-10-CM | POA: Diagnosis not present

## 2019-07-29 DIAGNOSIS — H3530 Unspecified macular degeneration: Secondary | ICD-10-CM | POA: Diagnosis not present

## 2019-07-29 NOTE — Telephone Encounter (Signed)
Spoke with [facility] and gave verbal order to collect urine for UA and culture

## 2019-07-29 NOTE — Telephone Encounter (Signed)
[name] with [facility] Called about pt. Pts daughter [name] set up an appt next week to have pts urine checked however [name] said she could check that while she was out there she just needed a verbal order. She can be reached at [phone]

## 2019-07-30 DIAGNOSIS — I480 Paroxysmal atrial fibrillation: Secondary | ICD-10-CM | POA: Diagnosis not present

## 2019-07-30 DIAGNOSIS — E785 Hyperlipidemia, unspecified: Secondary | ICD-10-CM | POA: Diagnosis not present

## 2019-07-30 DIAGNOSIS — E039 Hypothyroidism, unspecified: Secondary | ICD-10-CM | POA: Diagnosis not present

## 2019-07-30 DIAGNOSIS — M1990 Unspecified osteoarthritis, unspecified site: Secondary | ICD-10-CM | POA: Diagnosis not present

## 2019-07-30 DIAGNOSIS — I495 Sick sinus syndrome: Secondary | ICD-10-CM | POA: Diagnosis not present

## 2019-07-30 DIAGNOSIS — N390 Urinary tract infection, site not specified: Secondary | ICD-10-CM | POA: Diagnosis not present

## 2019-07-30 DIAGNOSIS — E871 Hypo-osmolality and hyponatremia: Secondary | ICD-10-CM | POA: Diagnosis not present

## 2019-07-30 DIAGNOSIS — K219 Gastro-esophageal reflux disease without esophagitis: Secondary | ICD-10-CM | POA: Diagnosis not present

## 2019-07-30 DIAGNOSIS — H409 Unspecified glaucoma: Secondary | ICD-10-CM | POA: Diagnosis not present

## 2019-07-30 DIAGNOSIS — H9190 Unspecified hearing loss, unspecified ear: Secondary | ICD-10-CM | POA: Diagnosis not present

## 2019-07-30 DIAGNOSIS — I10 Essential (primary) hypertension: Secondary | ICD-10-CM | POA: Diagnosis not present

## 2019-07-30 DIAGNOSIS — H3530 Unspecified macular degeneration: Secondary | ICD-10-CM | POA: Diagnosis not present

## 2019-07-30 DIAGNOSIS — G4733 Obstructive sleep apnea (adult) (pediatric): Secondary | ICD-10-CM | POA: Diagnosis not present

## 2019-07-31 ENCOUNTER — Telehealth: Payer: Self-pay | Admitting: Family Medicine

## 2019-07-31 ENCOUNTER — Other Ambulatory Visit: Payer: Self-pay | Admitting: Family Medicine

## 2019-07-31 ENCOUNTER — Ambulatory Visit (INDEPENDENT_AMBULATORY_CARE_PROVIDER_SITE_OTHER): Payer: Medicare Other | Admitting: *Deleted

## 2019-07-31 DIAGNOSIS — Z950 Presence of cardiac pacemaker: Secondary | ICD-10-CM

## 2019-07-31 LAB — CUP PACEART REMOTE DEVICE CHECK
Battery Remaining Longevity: 117 mo
Battery Remaining Percentage: 95.5 %
Battery Voltage: 3.01 V
Brady Statistic RV Percent Paced: 74 %
Date Time Interrogation Session: 20201203153020
Implantable Lead Implant Date: 20110422
Implantable Lead Implant Date: 20110422
Implantable Lead Location: 753859
Implantable Lead Location: 753860
Implantable Pulse Generator Implant Date: 20180319
Lead Channel Impedance Value: 410 Ohm
Lead Channel Pacing Threshold Amplitude: 1 V
Lead Channel Pacing Threshold Pulse Width: 0.5 ms
Lead Channel Sensing Intrinsic Amplitude: 2.4 mV
Lead Channel Setting Pacing Amplitude: 2 V
Lead Channel Setting Pacing Pulse Width: 0.5 ms
Lead Channel Setting Sensing Sensitivity: 1 mV
Pulse Gen Model: 2272
Pulse Gen Serial Number: 8010902

## 2019-07-31 NOTE — Telephone Encounter (Signed)
Pls call pt and daughter. Let her knoiw I have a CCUA which suggests a uTI, , the culture will be availabvle next week I see she just had a UTI on 11/18 so it is concerning that she is having recurrent infections I will prescribe nitrofurantoin for 5 days and see what the culture shows If she has fever, chills, flank pain, nausea, weakness or poor appetite needs urgent care or ED eval. Arrange tele visit next week pls

## 2019-08-01 ENCOUNTER — Other Ambulatory Visit: Payer: Self-pay | Admitting: Family Medicine

## 2019-08-01 ENCOUNTER — Telehealth: Payer: Self-pay | Admitting: Cardiology

## 2019-08-01 ENCOUNTER — Telehealth: Payer: Self-pay

## 2019-08-01 DIAGNOSIS — E871 Hypo-osmolality and hyponatremia: Secondary | ICD-10-CM | POA: Diagnosis not present

## 2019-08-01 DIAGNOSIS — E039 Hypothyroidism, unspecified: Secondary | ICD-10-CM | POA: Diagnosis not present

## 2019-08-01 DIAGNOSIS — I10 Essential (primary) hypertension: Secondary | ICD-10-CM | POA: Diagnosis not present

## 2019-08-01 DIAGNOSIS — N390 Urinary tract infection, site not specified: Secondary | ICD-10-CM | POA: Diagnosis not present

## 2019-08-01 DIAGNOSIS — H9190 Unspecified hearing loss, unspecified ear: Secondary | ICD-10-CM | POA: Diagnosis not present

## 2019-08-01 DIAGNOSIS — I495 Sick sinus syndrome: Secondary | ICD-10-CM | POA: Diagnosis not present

## 2019-08-01 DIAGNOSIS — G4733 Obstructive sleep apnea (adult) (pediatric): Secondary | ICD-10-CM | POA: Diagnosis not present

## 2019-08-01 DIAGNOSIS — M1990 Unspecified osteoarthritis, unspecified site: Secondary | ICD-10-CM | POA: Diagnosis not present

## 2019-08-01 DIAGNOSIS — H3530 Unspecified macular degeneration: Secondary | ICD-10-CM | POA: Diagnosis not present

## 2019-08-01 DIAGNOSIS — E785 Hyperlipidemia, unspecified: Secondary | ICD-10-CM | POA: Diagnosis not present

## 2019-08-01 DIAGNOSIS — I480 Paroxysmal atrial fibrillation: Secondary | ICD-10-CM | POA: Diagnosis not present

## 2019-08-01 DIAGNOSIS — H409 Unspecified glaucoma: Secondary | ICD-10-CM | POA: Diagnosis not present

## 2019-08-01 DIAGNOSIS — K219 Gastro-esophageal reflux disease without esophagitis: Secondary | ICD-10-CM | POA: Diagnosis not present

## 2019-08-01 MED ORDER — BUSPIRONE HCL 5 MG PO TABS: 5.0000 mg | ORAL_TABLET | Freq: Two times a day (BID) | ORAL | 3 refills | Status: DC

## 2019-08-01 NOTE — Telephone Encounter (Signed)
[facility] notified and voiced understanding.

## 2019-08-01 NOTE — Progress Notes (Signed)
nirofurantoin

## 2019-08-01 NOTE — Telephone Encounter (Signed)
Asking to speak with nurse re of weight gain.   Thinking 6 lbs in oe night.   No symptoms except heart rate up due to being upset about having to weigh

## 2019-08-01 NOTE — Telephone Encounter (Signed)
Preliminary culture results:  Gram negative rods greater than 100,000 colony forming units per mL

## 2019-08-01 NOTE — Telephone Encounter (Signed)
I spoke directly with daughter pharmacy not seeing this, trying to call and I re sent

## 2019-08-01 NOTE — Telephone Encounter (Signed)
After sending to [facility] , pharmacy staff came o the phone and finally confirmed

## 2019-08-01 NOTE — Progress Notes (Signed)
buspar 5

## 2019-08-01 NOTE — Telephone Encounter (Signed)
Correct move would be to take her prn lasix, likely will need over the weekend, have her update [place] on Monday on weights.    Zandra Abts MD

## 2019-08-01 NOTE — Telephone Encounter (Signed)
She was started on nitrofurantoin yesterday

## 2019-08-01 NOTE — Telephone Encounter (Signed)
Pt wt on yesterday was 154 lb and today was 161lb. Pt was given Lasix 40 mg on today. Please advise.

## 2019-08-01 NOTE — Progress Notes (Signed)
maxcrobid

## 2019-08-01 NOTE — Telephone Encounter (Signed)
[facility] nurse called in results of UA. They are trying to get the culture report now.   WBC 3+ abnormal Ketones trace abnormal Occult blood 2+ abnormal Clumps of leucocytes present RBC 11-30 abnormal Bacteria Moderate   Can you treat?

## 2019-08-03 DIAGNOSIS — R0902 Hypoxemia: Secondary | ICD-10-CM | POA: Diagnosis not present

## 2019-08-04 ENCOUNTER — Other Ambulatory Visit: Payer: Self-pay | Admitting: Family Medicine

## 2019-08-04 ENCOUNTER — Telehealth: Payer: Self-pay | Admitting: Cardiology

## 2019-08-04 DIAGNOSIS — I480 Paroxysmal atrial fibrillation: Secondary | ICD-10-CM | POA: Diagnosis not present

## 2019-08-04 DIAGNOSIS — H9190 Unspecified hearing loss, unspecified ear: Secondary | ICD-10-CM | POA: Diagnosis not present

## 2019-08-04 DIAGNOSIS — E039 Hypothyroidism, unspecified: Secondary | ICD-10-CM | POA: Diagnosis not present

## 2019-08-04 DIAGNOSIS — G4733 Obstructive sleep apnea (adult) (pediatric): Secondary | ICD-10-CM | POA: Diagnosis not present

## 2019-08-04 DIAGNOSIS — N390 Urinary tract infection, site not specified: Secondary | ICD-10-CM | POA: Diagnosis not present

## 2019-08-04 DIAGNOSIS — H3530 Unspecified macular degeneration: Secondary | ICD-10-CM | POA: Diagnosis not present

## 2019-08-04 DIAGNOSIS — H409 Unspecified glaucoma: Secondary | ICD-10-CM | POA: Diagnosis not present

## 2019-08-04 DIAGNOSIS — K219 Gastro-esophageal reflux disease without esophagitis: Secondary | ICD-10-CM | POA: Diagnosis not present

## 2019-08-04 DIAGNOSIS — I10 Essential (primary) hypertension: Secondary | ICD-10-CM | POA: Diagnosis not present

## 2019-08-04 DIAGNOSIS — M1990 Unspecified osteoarthritis, unspecified site: Secondary | ICD-10-CM | POA: Diagnosis not present

## 2019-08-04 DIAGNOSIS — I495 Sick sinus syndrome: Secondary | ICD-10-CM | POA: Diagnosis not present

## 2019-08-04 DIAGNOSIS — E785 Hyperlipidemia, unspecified: Secondary | ICD-10-CM | POA: Diagnosis not present

## 2019-08-04 DIAGNOSIS — E871 Hypo-osmolality and hyponatremia: Secondary | ICD-10-CM | POA: Diagnosis not present

## 2019-08-04 NOTE — Telephone Encounter (Signed)
[name] with [facility] called to report patients weights [phone]  07/30/2019  155.6 07/31/2019 154.6 08/01/2019 161.2 08/02/2019 159.0 08/03/2019 153.4 12/.02/2019 157.8  Pulse was 123 on 08-01-2019 Pulse was  88 on 08-04-2019  Patient has requested to have her sister called also.  Cecille Rubin [phone]

## 2019-08-04 NOTE — Telephone Encounter (Signed)
noted 

## 2019-08-04 NOTE — Telephone Encounter (Signed)
I spoke with daughter over the weekend, it is in the result note, thanks

## 2019-08-04 NOTE — Progress Notes (Signed)
[facility]  

## 2019-08-04 NOTE — Telephone Encounter (Signed)
I spoke with [facility] Stefanie Libel, she sees patient once a week and could not verify if patient took lasix 40 mg qd.I hac=ve LM for family to call.

## 2019-08-04 NOTE — Telephone Encounter (Signed)
Weight are up and down. Verify she has been taking lasix 40mg  daily, if so increase to 60mg  daily. Update on weights at the end of the week. Heart rates today look fine, elevated a few days ago. Continue to monitor and update [place] at the end of the week as well   Zandra Abts MD

## 2019-08-04 NOTE — Telephone Encounter (Signed)
Has appt with [name] Wednesday and CPE with you the end of the month.

## 2019-08-05 ENCOUNTER — Telehealth: Payer: Self-pay

## 2019-08-05 ENCOUNTER — Other Ambulatory Visit: Payer: Self-pay | Admitting: Cardiology

## 2019-08-05 ENCOUNTER — Telehealth: Payer: Self-pay | Admitting: *Deleted

## 2019-08-05 ENCOUNTER — Other Ambulatory Visit: Payer: Self-pay

## 2019-08-05 MED ORDER — FUROSEMIDE 40 MG PO TABS: 60.0000 mg | ORAL_TABLET | Freq: Every day | ORAL | 3 refills | Status: DC

## 2019-08-05 MED ORDER — NITROFURANTOIN MONOHYD MACRO 100 MG PO CAPS: 100.0000 mg | ORAL_CAPSULE | Freq: Two times a day (BID) | ORAL | 0 refills | Status: DC

## 2019-08-05 NOTE — Telephone Encounter (Signed)
Pt finishes the medication today, and she still has burning

## 2019-08-05 NOTE — Telephone Encounter (Signed)
Lucky with [facility] would like a call back she has sent over [facility] labs (placed in Dr Moshe Cipro box) and wanted to know what was recommended from here as the antibiotic she was on was sensitive

## 2019-08-05 NOTE — Telephone Encounter (Signed)
Was referred to urology yesterday, anything else you want to do?

## 2019-08-05 NOTE — Telephone Encounter (Signed)
Daughter aware.

## 2019-08-05 NOTE — Telephone Encounter (Signed)
Urine is sensitive to nitrofurantoin whgich was prescribed , please refill the 5 day course x 1 for a total of 10 days and let them know, thanks

## 2019-08-05 NOTE — Addendum Note (Signed)
Addended by: Barbarann Ehlers A on: 08/05/2019 03:25 PM   Modules accepted: Orders

## 2019-08-05 NOTE — Telephone Encounter (Signed)
LM for daughter, laurie, to call back-cc

## 2019-08-05 NOTE — Telephone Encounter (Signed)
Please call pt's daughter [name] @ [phone], has question about medication.

## 2019-08-05 NOTE — Telephone Encounter (Signed)
I spoke with daughter, they will increase lasix to 60 mg daily and call back at the end of the week.

## 2019-08-05 NOTE — Telephone Encounter (Signed)
Noted  

## 2019-08-05 NOTE — Telephone Encounter (Signed)
Note is sent to [facility] re additional management, I have seen the c/s report, you may check her box if she has not yet responded as 2 different msg have come in on the same pt, thanks

## 2019-08-05 NOTE — Telephone Encounter (Signed)
Please advise 

## 2019-08-06 ENCOUNTER — Ambulatory Visit: Payer: Medicare Other | Admitting: Family Medicine

## 2019-08-06 ENCOUNTER — Telehealth: Payer: Self-pay

## 2019-08-06 ENCOUNTER — Telehealth: Payer: Self-pay | Admitting: Cardiology

## 2019-08-06 NOTE — Telephone Encounter (Signed)
Will forward to Dr. Branch. 

## 2019-08-06 NOTE — Telephone Encounter (Signed)
Judson Roch is calling regarding a drug interaction ==please call [phone]

## 2019-08-06 NOTE — Telephone Encounter (Signed)
Sarah w/ [facility] called stating there is a Level 1 Drug Interaction w/ the pt's   diltiazem (CARDIZEM CD) 240 MG 24 hr capsule [id number]   And   lovastatin (MEVACOR) 40 MG tablet [id number]   please call Sarah @ [phone]

## 2019-08-06 NOTE — Telephone Encounter (Signed)
Calling about a possible drug interaction between cardizem and lovastatin (which was started yesterday) advised he call and notify him of the interaction. She will call them

## 2019-08-07 NOTE — Telephone Encounter (Signed)
Sarah w/ [facility] called back and left a message wanting someone to please return her call to [phone]

## 2019-08-08 ENCOUNTER — Telehealth: Payer: Self-pay

## 2019-08-08 MED ORDER — FUROSEMIDE 40 MG PO TABS: 80.0000 mg | ORAL_TABLET | Freq: Every day | ORAL | 3 refills | Status: DC

## 2019-08-08 NOTE — Telephone Encounter (Signed)
Returned call to Judson Roch with [facility]- no answer, unable to leave message as voicemail box is full at this time.

## 2019-08-08 NOTE — Telephone Encounter (Signed)
Not an absolute contraindication but can lower lovastatin to 20mg  daily    Zandra Abts MD

## 2019-08-08 NOTE — Telephone Encounter (Signed)
Weights sill up and down, increase lasix to 80mg  daily, update [place] on Monday   Zandra Abts MD

## 2019-08-08 NOTE — Telephone Encounter (Signed)
12/3   154.6  lbs 12/4    161.2  12/5    159   12/6    153.4   12/7    157.8   12/8    157.8    12/9    150.1   12/10   159  12/11   159.6   Taking Lasix 60 mg daily

## 2019-08-08 NOTE — Telephone Encounter (Signed)
Daughter will increase lasix to 80 mg qd and call back on Monday, 08/11/2019

## 2019-08-11 NOTE — Telephone Encounter (Signed)
Some decline in weight, continue lasix 80mg  daily and update [place] Friday   Zandra Abts MD

## 2019-08-11 NOTE — Telephone Encounter (Signed)
Daughter will continue at 80 mg and call back on Friday with weights.

## 2019-08-11 NOTE — Telephone Encounter (Signed)
On Lasix 80 mg daily: Weight: 12/11    159.6 lbs                                                    12/12    159                                                      12/1      158.6                                                      12/14    158

## 2019-08-14 ENCOUNTER — Ambulatory Visit (INDEPENDENT_AMBULATORY_CARE_PROVIDER_SITE_OTHER): Payer: Medicare Other | Admitting: Family Medicine

## 2019-08-14 ENCOUNTER — Encounter: Payer: Self-pay | Admitting: Family Medicine

## 2019-08-14 ENCOUNTER — Telehealth: Payer: Self-pay | Admitting: *Deleted

## 2019-08-14 ENCOUNTER — Other Ambulatory Visit: Payer: Self-pay

## 2019-08-14 ENCOUNTER — Encounter: Payer: Medicare Other | Admitting: Family Medicine

## 2019-08-14 VITALS — Ht 66.0 in | Wt 161.0 lb

## 2019-08-14 DIAGNOSIS — R300 Dysuria: Secondary | ICD-10-CM | POA: Diagnosis not present

## 2019-08-14 DIAGNOSIS — N3001 Acute cystitis with hematuria: Secondary | ICD-10-CM | POA: Diagnosis not present

## 2019-08-14 DIAGNOSIS — Z87440 Personal history of urinary (tract) infections: Secondary | ICD-10-CM | POA: Diagnosis not present

## 2019-08-14 DIAGNOSIS — N390 Urinary tract infection, site not specified: Secondary | ICD-10-CM | POA: Insufficient documentation

## 2019-08-14 LAB — POCT URINALYSIS DIP (CLINITEK)
Bilirubin, UA: NEGATIVE
Glucose, UA: NEGATIVE mg/dL
Ketones, POC UA: NEGATIVE mg/dL
Nitrite, UA: NEGATIVE
POC PROTEIN,UA: NEGATIVE
Spec Grav, UA: 1.02 (ref 1.010–1.025)
Urobilinogen, UA: 0.2 E.U./dL
pH, UA: 7.5 (ref 5.0–8.0)

## 2019-08-14 NOTE — Assessment & Plan Note (Signed)
+   UA for trace WBC and blood, but improved from previous Nov check. Completed antibiotics but still have S&S.  Might need prophylactic measures has upcoming neurologist appointment will let them decide on that. If culture comes back positive for bacteria will be happy to treat based off of resistance/sensitivity profile. Patient acknowledged agreement and understanding of the plan.

## 2019-08-14 NOTE — Patient Instructions (Addendum)
  I appreciate the opportunity to provide you with care for your health and wellness. Today we discussed: UTI symptoms   Follow up: 09/24/2019  No labs or referrals today  Trace WBC's some blood-appears to have improved from last UA in November. Will need culture results before trying another antibiotic. Might need on going treatment-but will let urologist decided that. We will treat if Culture shows positive for bacteria again.  Continue to use baby wipes,, changing pads regularly, changing underwear if soiled.  Wiping from front to back.  Drinking plenty of water and not holding urine if needing to void.  I hope you have a wonderful, happy, safe, and healthy Holiday Season! See you in the New Year :)  Please continue to practice social distancing to keep you, your family, and our community safe.  If you must go out, please wear a mask and practice good handwashing.  It was a pleasure to see you and I look forward to continuing to work together on your health and well-being. Please do not hesitate to call the office if you need care or have questions about your care.  Have a wonderful day and week. With Gratitude, Cherly Beach, DNP, AGNP-BC

## 2019-08-14 NOTE — Telephone Encounter (Signed)
Noted, thanks!

## 2019-08-14 NOTE — Progress Notes (Signed)
Virtual Visit via Telephone Note   This visit type was conducted due to national recommendations for restrictions regarding the COVID-19 Pandemic (e.g. social distancing) in an effort to limit this patient's exposure and mitigate transmission in our community.  Due to her co-morbid illnesses, this patient is at least at moderate risk for complications without adequate follow up.  This format is felt to be most appropriate for this patient at this time.  The patient did not have access to video technology/had technical difficulties with video requiring transitioning to audio format only (telephone).  All issues noted in this document were discussed and addressed.  No physical exam could be performed with this format.    Evaluation Performed:  Follow-up visit  Date:  [id number]   ID:  ARICELA TORREALBA, DOB [id number], MRN [id number]  Patient Location: Home Provider Location: Office  Location of Patient: Home Location of Provider: Telehealth Consent was obtain for visit to be over via telehealth. I verified that I am speaking with the correct person using two identifiers.  PCP:  Fayrene Helper, MD   Chief Complaint:  UTI like symptoms   History of Present Illness:    [name] is a 85 y.o. female with history of recurrent urinary tract infection,arthritis, GERD, hypothyroidism, hypertension among others. Presents today for continued urinary tract infection symptoms. She presented to the urgent care back on November 16 with complaints of dysuria and a low-grade fever times a week with worsening symptoms for the past 2 days prior to that visit. At that time she denied having delaying her bathroom breaks, decreased water intake, or poor hygiene. She had not tried anything over-the-counter. She reports symptoms were worse with urination. Has had similar symptoms in the past related to urinary tract infection. During that visit she denied chills, nausea, vomiting, abdominal pain, flank  pain, hematuria. She was started on Keflex but culture demonstrated that it was resistant E. coli to that. She was then called in [place] which was shown to have sensitivity. But she shows up today for phone visit and provided urinary sample earlier in the day which demonstrated still present WBCs and she still has urinary tract infection symptoms: Including dysuria, pressure with need to void, increased frequency.  Continues to deny having fevers, chills, nausea, abdominal pain, flank pain, blood seen in the urine.  Reports that they have been trying to implement better hygiene practices like using baby wipes changing her pad regularly.  Making sure her underwear is changed if there is soiled.  In addition to possibly using a spray bottle to make sure she is cleaned better..   The patient does not have symptoms concerning for COVID-19 infection (fever, chills, cough, or new shortness of breath).   Past Medical, Surgical, Social History, Allergies, and Medications have been Reviewed.  Past Medical History:  Diagnosis Date  . Arthritis   . Gastroesophageal reflux disease   . Hearing impairment   . Hyperlipidemia   . Hypertension   . Hypothyroidism   . Impaired glucose tolerance   . Macular degeneration   . Pancreatitis   . Paroxysmal atrial fibrillation ([place]) 2011  . Sick sinus syndrome ([place]) 2011   Atrial fibrilation 10/2009; and dual chamber pacemaker in 4/11; normal EF  . Sleep apnea    Nocturnal oxygen therapy  . Zoster 2016   Past Surgical History:  Procedure Laterality Date  . ABDOMINAL HYSTERECTOMY    . CATARACT EXTRACTION     Bilateral  .  COLONOSCOPY  2009   Dr. Gala Romney  . EYE SURGERY     laser   . PACEMAKER INSERTION  2011   dual chamber   . PPM GENERATOR CHANGEOUT N/A 11/13/2016   Procedure: PPM Generator Changeout;  Surgeon: Evans Lance, MD;  Location: [facility];  Service: Cardiovascular;  Laterality: N/A;  . SALPINGOOPHORECTOMY  1970   right     Current  Meds  Medication Sig  . busPIRone (BUSPAR) 5 MG tablet Take 1 tablet (5 mg total) by mouth 2 (two) times daily.  . busPIRone (BUSPAR) 5 MG tablet Take 1 tablet (5 mg total) by mouth 2 (two) times daily.  . Calcium Carb-Cholecalciferol 500-400 MG-UNIT CHEW Chew 1 tablet by mouth daily.   Marland Kitchen diltiazem (CARDIZEM CD) 240 MG 24 hr capsule Take 1 capsule (240 mg total) by mouth daily.  . fluticasone (FLONASE) 50 MCG/ACT nasal spray Place 1 spray into both nostrils daily.  . furosemide (LASIX) 40 MG tablet Take 2 tablets (80 mg total) by mouth daily.  Marland Kitchen levothyroxine (SYNTHROID) 112 MCG tablet TAKE 1 TABLET BY MOUTH ONCE DAILY. (Patient taking differently: Take 112 mcg by mouth daily before breakfast. )  . lovastatin (MEVACOR) 40 MG tablet Take 1 tablet (40 mg total) by mouth at bedtime.  . meclizine (ANTIVERT) 12.5 MG tablet Take 1 tablet (12.5 mg total) by mouth 2 (two) times daily as needed for dizziness.  . metoprolol tartrate (LOPRESSOR) 100 MG tablet TAKE 1 TABLET BY MOUTH TWICE A DAY. (Patient taking differently: Take 100 mg by mouth 2 (two) times daily. )  . Multiple Vitamins-Minerals (EYE VITAMINS & MINERALS PO) Take 1 tablet by mouth daily.  . nitrofurantoin, macrocrystal-monohydrate, (MACROBID) 100 MG capsule Take 1 capsule (100 mg total) by mouth 2 (two) times daily.  Marland Kitchen omeprazole (PRILOSEC) 40 MG capsule TAKE (1) CAPSULE BY MOUTH ONCE DAILY FOR ACID REFLUX.  Marland Kitchen OXYGEN-HELIUM IN Inhale 2 L into the lungs at bedtime.  Marland Kitchen warfarin (COUMADIN) 5 MG tablet TAKE 1/2 TABLET BY MOUTH DAILY EXCEPT 1 TABLET ON WEDNESDAYS & SATURDAYS OR AS DIRECTED. (Patient taking differently: Take 2.5-5 mg by mouth See admin instructions. TAKE 1/2 TABLET BY MOUTH DAILY EXCEPT 1 TABLET ON WEDNESDAYS & SATURDAYS OR AS DIRECTED.)     Allergies:   Patient has no known allergies.   ROS:   Please see the history of present illness.    All other systems reviewed and are negative.   Labs/Other Tests and Data Reviewed:     Recent Labs: 05/15/2019: ALT 8 07/03/2019: B Natriuretic Peptide 232.0 07/04/2019: TSH 2.855 07/05/2019: Magnesium 1.8 07/08/2019: BUN 14; Creatinine, Ser 0.87; Hemoglobin 10.8; Platelets 287; Potassium 4.8; Sodium 128   Recent Lipid Panel Lab Results  Component Value Date/Time   CHOL 133 05/15/2019 10:52 AM   TRIG 110 05/15/2019 10:52 AM   HDL 64 05/15/2019 10:52 AM   CHOLHDL 2.1 05/15/2019 10:52 AM   LDLCALC 49 05/15/2019 10:52 AM    Wt Readings from Last 3 Encounters:  08/14/19 161 lb (73 kg)  07/15/19 167 lb (75.8 kg)  07/08/19 166 lb 3.6 oz (75.4 kg)     Objective:    Vital Signs:  Ht 5\' 6"  (1.676 m)   Wt 161 lb (73 kg)   BMI 25.99 kg/m    GEN:  alert and oriented RESPIRATORY:  no shortness of breath noted in conversation  PSYCH:  normal affect and mood   ASSESSMENT & PLAN:   Please see problem  list for A&P of Dx below  1. Burning with urination  - POCT URINALYSIS DIP (CLINITEK) - Urinalysis with Culture Reflex  2. Acute cystitis with hematuria   3. Recurrent UTI (urinary tract infection)    Time:   Today, I have spent 10 minutes with the patient with telehealth technology discussing the above problems.     Medication Adjustments/Labs and Tests Ordered: Current medicines are reviewed at length with the patient today.  Concerns regarding medicines are outlined above.   Tests Ordered: Orders Placed This Encounter  Procedures  . Urinalysis with Culture Reflex  . POCT URINALYSIS DIP (CLINITEK)    Medication Changes: No orders of the defined types were placed in this encounter.   Disposition:  Follow up 09/24/2019  Signed, Perlie Mayo, NP  08/14/2019 2:39 PM     [facility]

## 2019-08-14 NOTE — Telephone Encounter (Signed)
Change to phone visit for uTI please

## 2019-08-14 NOTE — Telephone Encounter (Signed)
Daughters cancelled appt this morning. Will put on Hannah's schedule for this afternoon

## 2019-08-14 NOTE — Telephone Encounter (Signed)
[name] pts daugther  cancelled appt for today 08/14/19 but thinks her mom still has the uti and wanted to know what would be the best option as the urologist cannot see her until 10-01-2019. Wanted to know if her mom could come by and leave a sample.

## 2019-08-15 ENCOUNTER — Other Ambulatory Visit (HOSPITAL_COMMUNITY)
Admission: RE | Admit: 2019-08-15 | Discharge: 2019-08-15 | Disposition: A | Discharge status: Home / Self Care | Payer: Medicare Other | Source: Other Acute Inpatient Hospital | Attending: Family Medicine | Admitting: Family Medicine

## 2019-08-15 ENCOUNTER — Telehealth: Payer: Self-pay | Admitting: *Deleted

## 2019-08-15 DIAGNOSIS — R300 Dysuria: Secondary | ICD-10-CM | POA: Insufficient documentation

## 2019-08-15 LAB — URINALYSIS, ROUTINE W REFLEX MICROSCOPIC
Bacteria, UA: NONE SEEN
Bilirubin Urine: NEGATIVE
Glucose, UA: NEGATIVE mg/dL
Ketones, ur: NEGATIVE mg/dL
Leukocytes,Ua: NEGATIVE
Nitrite: NEGATIVE
Protein, ur: NEGATIVE mg/dL
Specific Gravity, Urine: 1.004 — ABNORMAL LOW (ref 1.005–1.030)
pH: 7 (ref 5.0–8.0)

## 2019-08-15 NOTE — Telephone Encounter (Signed)
Daughter [name] calling Daily wts on pt  12/14 158.0 12/15 156.8 12/16 159.6 12/17 161.0 12/18 159.2   Please advise.

## 2019-08-15 NOTE — Telephone Encounter (Signed)
Weight continue to fluctuate.  Continue present diuretic regimen.

## 2019-08-15 NOTE — Telephone Encounter (Signed)
Pt's daughter notified and voiced understanding.  

## 2019-08-17 LAB — URINE CULTURE: Culture: 80000 — AB

## 2019-08-18 ENCOUNTER — Telehealth: Payer: Self-pay | Admitting: *Deleted

## 2019-08-18 ENCOUNTER — Other Ambulatory Visit: Payer: Self-pay | Admitting: Family Medicine

## 2019-08-18 DIAGNOSIS — N390 Urinary tract infection, site not specified: Secondary | ICD-10-CM

## 2019-08-18 DIAGNOSIS — N3001 Acute cystitis with hematuria: Secondary | ICD-10-CM

## 2019-08-18 MED ORDER — FUROSEMIDE 80 MG PO TABS: 80.0000 mg | ORAL_TABLET | Freq: Every day | ORAL | 3 refills | Status: DC

## 2019-08-18 MED ORDER — SULFAMETHOXAZOLE-TRIMETHOPRIM 800-160 MG PO TABS: 1.0000 | ORAL_TABLET | Freq: Two times a day (BID) | ORAL | 0 refills | Status: AC

## 2019-08-18 NOTE — Telephone Encounter (Signed)
No changes to diuretic regimen.

## 2019-08-18 NOTE — Telephone Encounter (Signed)
Zachery Conch RN with [facility] just wanted to let Dr Moshe Cipro know that they did not see [name] and she had a missed visit due to their was some sick household members possible covid. They would try again this week. She can be reached at [phone] with any questions.

## 2019-08-18 NOTE — Telephone Encounter (Signed)
Noted and aware.

## 2019-08-18 NOTE — Telephone Encounter (Signed)
Calling to give weekend weight recordings.

## 2019-08-18 NOTE — Addendum Note (Signed)
Addended by: Debbora Lacrosse R on: 08/18/2019 04:10 PM   Modules accepted: Orders

## 2019-08-18 NOTE — Telephone Encounter (Signed)
Weights over weekend were 157.8, 156.4 ,156.6.

## 2019-08-18 NOTE — Telephone Encounter (Signed)
Wei

## 2019-08-18 NOTE — Telephone Encounter (Signed)
Pt's daughter made aware. Sent in RX for 80 mg.

## 2019-08-19 ENCOUNTER — Other Ambulatory Visit: Payer: Self-pay | Admitting: Family Medicine

## 2019-08-20 DIAGNOSIS — H3530 Unspecified macular degeneration: Secondary | ICD-10-CM

## 2019-08-20 DIAGNOSIS — Z9181 History of falling: Secondary | ICD-10-CM

## 2019-08-20 DIAGNOSIS — J309 Allergic rhinitis, unspecified: Secondary | ICD-10-CM

## 2019-08-20 DIAGNOSIS — H9190 Unspecified hearing loss, unspecified ear: Secondary | ICD-10-CM

## 2019-08-20 DIAGNOSIS — E871 Hypo-osmolality and hyponatremia: Secondary | ICD-10-CM | POA: Diagnosis not present

## 2019-08-20 DIAGNOSIS — I495 Sick sinus syndrome: Secondary | ICD-10-CM | POA: Diagnosis not present

## 2019-08-20 DIAGNOSIS — N390 Urinary tract infection, site not specified: Secondary | ICD-10-CM | POA: Diagnosis not present

## 2019-08-20 DIAGNOSIS — E039 Hypothyroidism, unspecified: Secondary | ICD-10-CM

## 2019-08-20 DIAGNOSIS — H409 Unspecified glaucoma: Secondary | ICD-10-CM

## 2019-08-20 DIAGNOSIS — M1990 Unspecified osteoarthritis, unspecified site: Secondary | ICD-10-CM | POA: Diagnosis not present

## 2019-08-20 DIAGNOSIS — E785 Hyperlipidemia, unspecified: Secondary | ICD-10-CM

## 2019-08-20 DIAGNOSIS — G4733 Obstructive sleep apnea (adult) (pediatric): Secondary | ICD-10-CM

## 2019-08-20 DIAGNOSIS — I480 Paroxysmal atrial fibrillation: Secondary | ICD-10-CM

## 2019-08-20 DIAGNOSIS — R7302 Impaired glucose tolerance (oral): Secondary | ICD-10-CM

## 2019-08-20 DIAGNOSIS — Z7901 Long term (current) use of anticoagulants: Secondary | ICD-10-CM

## 2019-08-20 DIAGNOSIS — I10 Essential (primary) hypertension: Secondary | ICD-10-CM | POA: Diagnosis not present

## 2019-08-20 DIAGNOSIS — K219 Gastro-esophageal reflux disease without esophagitis: Secondary | ICD-10-CM

## 2019-08-20 DIAGNOSIS — R42 Dizziness and giddiness: Secondary | ICD-10-CM

## 2019-08-26 NOTE — Progress Notes (Signed)
PPM remote 

## 2019-08-27 ENCOUNTER — Ambulatory Visit (INDEPENDENT_AMBULATORY_CARE_PROVIDER_SITE_OTHER): Payer: Medicare Other | Admitting: *Deleted

## 2019-08-27 ENCOUNTER — Other Ambulatory Visit: Payer: Self-pay

## 2019-08-27 DIAGNOSIS — Z5181 Encounter for therapeutic drug level monitoring: Secondary | ICD-10-CM | POA: Diagnosis not present

## 2019-08-27 DIAGNOSIS — I480 Paroxysmal atrial fibrillation: Secondary | ICD-10-CM | POA: Diagnosis not present

## 2019-08-27 LAB — POCT INR: INR: 3.6 — AB (ref 2.0–3.0)

## 2019-08-27 NOTE — Patient Instructions (Signed)
Hold warfarin tonight then resume 1/2 tablet daily except 1 tablet on Wednesdays and Saturdays.  Recheck in 4 weeks Keep intake of greens consistent

## 2019-09-03 DIAGNOSIS — R0902 Hypoxemia: Secondary | ICD-10-CM | POA: Diagnosis not present

## 2019-09-11 ENCOUNTER — Telehealth: Payer: Self-pay | Admitting: Cardiology

## 2019-09-11 ENCOUNTER — Telehealth: Payer: Self-pay

## 2019-09-11 NOTE — Telephone Encounter (Signed)
Patients daughter called. Wants to know if patient needs labs prior to appt later this month. Please advise. [phone]

## 2019-09-11 NOTE — Telephone Encounter (Signed)
Pt's daughter call wanting to know if pt is needing to have labs done  Please call [phone]

## 2019-09-11 NOTE — Telephone Encounter (Signed)
Will send message to provider for clarification.  Patient has 4 month follow up scheduled for 11/11/2019 with Dr. Harl Bowie.

## 2019-09-12 NOTE — Telephone Encounter (Signed)
None needed from me at this time, would reassess at her appointment to see if anything needed at that time based on assessment   Zandra Abts MD

## 2019-09-12 NOTE — Telephone Encounter (Signed)
Daughter informed and verbalized understanding

## 2019-09-15 NOTE — Telephone Encounter (Signed)
Had normal TSH in November, no need to get labs for upcoming visit, esp as tryimg to limit exposure at thsi time

## 2019-09-16 NOTE — Telephone Encounter (Signed)
Spoke with daugther laurie and let her know no labs needed

## 2019-09-16 NOTE — Telephone Encounter (Signed)
No labs needed

## 2019-09-24 ENCOUNTER — Other Ambulatory Visit: Payer: Self-pay

## 2019-09-24 ENCOUNTER — Ambulatory Visit (INDEPENDENT_AMBULATORY_CARE_PROVIDER_SITE_OTHER): Payer: Medicare Other | Admitting: Family Medicine

## 2019-09-24 ENCOUNTER — Other Ambulatory Visit (HOSPITAL_COMMUNITY)
Admission: RE | Admit: 2019-09-24 | Discharge: 2019-09-24 | Disposition: A | Discharge status: Home / Self Care | Payer: Medicare Other | Source: Other Acute Inpatient Hospital | Attending: Orthopedic Surgery | Admitting: Orthopedic Surgery

## 2019-09-24 VITALS — BP 108/70 | HR 74 | Temp 97.5°F | Resp 15 | Ht 66.0 in | Wt 157.6 lb

## 2019-09-24 DIAGNOSIS — I10 Essential (primary) hypertension: Secondary | ICD-10-CM

## 2019-09-24 DIAGNOSIS — E559 Vitamin D deficiency, unspecified: Secondary | ICD-10-CM

## 2019-09-24 DIAGNOSIS — N3001 Acute cystitis with hematuria: Secondary | ICD-10-CM | POA: Diagnosis not present

## 2019-09-24 DIAGNOSIS — R413 Other amnesia: Secondary | ICD-10-CM

## 2019-09-24 DIAGNOSIS — E785 Hyperlipidemia, unspecified: Secondary | ICD-10-CM

## 2019-09-24 DIAGNOSIS — R7301 Impaired fasting glucose: Secondary | ICD-10-CM

## 2019-09-24 DIAGNOSIS — E038 Other specified hypothyroidism: Secondary | ICD-10-CM

## 2019-09-24 DIAGNOSIS — Z0000 Encounter for general adult medical examination without abnormal findings: Secondary | ICD-10-CM

## 2019-09-24 LAB — POCT URINALYSIS DIP (CLINITEK)
Bilirubin, UA: NEGATIVE
Glucose, UA: NEGATIVE mg/dL
Ketones, POC UA: NEGATIVE mg/dL
Nitrite, UA: NEGATIVE
POC PROTEIN,UA: NEGATIVE
Spec Grav, UA: 1.015 (ref 1.010–1.025)
Urobilinogen, UA: 0.2 E.U./dL
pH, UA: 6 (ref 5.0–8.0)

## 2019-09-24 MED ORDER — DONEPEZIL HCL 5 MG PO TABS: 5.0000 mg | ORAL_TABLET | Freq: Every day | ORAL | 3 refills | Status: DC

## 2019-09-24 NOTE — Progress Notes (Signed)
    [name]     MRN: [id number]      DOB: 09/25/1925  HPI: Patient is in for annual physical exam. Concerns are expressed by her daughter, patient feels well she states They are monitoring weight daily , she denies light headedness, PND or orthopnea, managing higher lasix dose well C/o lower abdominal discomfort, denies fever or chills, daughter concerned about possible UTI and wants urine checked Appetite is good, bowel movements regular. No falls,  and wants to hold on physical therapy at this time C/o sundowning with agitation which has worsened over the past 3 to 4 months, c/o increased forgetfulness, daughter wants help  PE: BP 108/70   Pulse 74   Temp (!) 97.5 F (36.4 C) (Temporal)   Resp 15   Ht 5\' 6"  (1.676 m)   Wt 157 lb 9.6 oz (71.5 kg)   SpO2 96%   BMI 25.44 kg/m  Pleasant  female, alert and oriented x 3, in no cardio-pulmonary distress. Afebrile. HEENT No facial trauma or asymetry. Sinuses non tender.  Extra occullar muscles intact.. External ears normal, . Neck: decreased ROM, no adenopathy,JVD or thyromegaly.No bruits.  Chest: Clear to ascultation bilaterally.No crackles or wheezes. Non tender to palpation  Breast: Not examined  Cardiovascular system; Heart sounds normal,  S1 and  S2 ,no S3.  No murmur, or thrill. Apical beat not displaced Peripheral pulses normal.  Abdomen: Soft, mild suprapubic tenderness, no organomegaly or masses. No bruits. Bowel sounds normal. No guarding, tenderness or rebound.      Musculoskeletal exam: Decreased  ROM of spine, hips , shoulders and knees.  deformity ,swelling and  crepitus noted. No muscle wasting or atrophy.   Neurologic: Cranial nerves 2 to 12 intact. Power, tone ,sensation  normal throughout. abnormal gait. No tremor.  Skin: Intact, no ulceration, erythema , scaling or rash noted. Pigmentation normal throughout  Psych; Normal mood and affect.   Assessment & Plan:  Annual physical  exam Annual exam as documented. Counseling done  re healthy lifestyle involving commitment to 150 minutes exercise per week, heart healthy diet, and attaining healthy weight.The importance of adequate sleep also discussed.  home safety, is also discussed.   Acute cystitis with hematuria Afebrile, normal appetite and energy level Abnormal UA, recurrent UTI with  upcoming Urology appt, will hold on antibiotic pending culture  Impaired memory MMSE at visit, then repeat in 8 weeks. Start aricept 5 mg, pt noted to have sundowning , will re assess also

## 2019-09-24 NOTE — Patient Instructions (Addendum)
F/u in 6 to 8 weeks, IN OFFICE,  call if you need me sooner, WITH REPEAT Mmse   Urine suggests you MAY have an  Infection, I will be in touch with result of culture   Will start  low dose medication for memory,ARICEPT, 5MG,  and re evaluate your response AT  NEXT VISIT  MMSE TODAY  fASTING Cbc, LIPID, CMP AND EgfR , Tsh AND VIT d 5 DAYS BEFORE NEXT APPOINTMENT

## 2019-09-25 ENCOUNTER — Encounter: Payer: Self-pay | Admitting: Family Medicine

## 2019-09-25 NOTE — Assessment & Plan Note (Signed)
MMSE at visit, then repeat in 8 weeks. Start aricept 5 mg, pt noted to have sundowning , will re assess also

## 2019-09-25 NOTE — Assessment & Plan Note (Signed)
Annual exam as documented. Counseling done  re healthy lifestyle involving commitment to 150 minutes exercise per week, heart healthy diet, and attaining healthy weight.The importance of adequate sleep also discussed.  home safety, is also discussed.

## 2019-09-25 NOTE — Assessment & Plan Note (Signed)
Afebrile, normal appetite and energy level Abnormal UA, recurrent UTI with  upcoming Urology appt, will hold on antibiotic pending culture

## 2019-09-26 LAB — URINE CULTURE: Culture: >=100000 — AB

## 2019-09-27 ENCOUNTER — Other Ambulatory Visit: Payer: Self-pay | Admitting: Family Medicine

## 2019-09-30 ENCOUNTER — Ambulatory Visit (INDEPENDENT_AMBULATORY_CARE_PROVIDER_SITE_OTHER): Payer: Medicare Other | Admitting: *Deleted

## 2019-09-30 ENCOUNTER — Other Ambulatory Visit: Payer: Self-pay

## 2019-09-30 DIAGNOSIS — I480 Paroxysmal atrial fibrillation: Secondary | ICD-10-CM

## 2019-09-30 DIAGNOSIS — Z5181 Encounter for therapeutic drug level monitoring: Secondary | ICD-10-CM

## 2019-09-30 LAB — POCT INR: INR: 2.7 (ref 2.0–3.0)

## 2019-09-30 NOTE — Patient Instructions (Signed)
Continue warfarin 1/2 tablet daily except 1 tablet on Wednesdays and Saturdays.  Recheck in 4 weeks Keep intake of greens consistent

## 2019-10-01 ENCOUNTER — Encounter: Payer: Self-pay | Admitting: Urology

## 2019-10-01 ENCOUNTER — Ambulatory Visit (INDEPENDENT_AMBULATORY_CARE_PROVIDER_SITE_OTHER): Payer: Medicare Other | Admitting: Urology

## 2019-10-01 VITALS — BP 111/61 | HR 80 | Temp 95.9°F | Ht 66.0 in | Wt 155.4 lb

## 2019-10-01 DIAGNOSIS — N390 Urinary tract infection, site not specified: Secondary | ICD-10-CM | POA: Diagnosis not present

## 2019-10-01 LAB — POCT URINALYSIS DIPSTICK
Bilirubin, UA: NEGATIVE
Glucose, UA: NEGATIVE
Ketones, UA: NEGATIVE
Nitrite, UA: NEGATIVE
Protein, UA: NEGATIVE
Spec Grav, UA: 1.015 (ref 1.010–1.025)
Urobilinogen, UA: NEGATIVE E.U./dL — AB
pH, UA: 5 (ref 5.0–8.0)

## 2019-10-01 LAB — BLADDER SCAN AMB NON-IMAGING: Scan Result: 10.7

## 2019-10-01 MED ORDER — NITROFURANTOIN MACROCRYSTAL 50 MG PO CAPS: 50.0000 mg | ORAL_CAPSULE | Freq: Every day | ORAL | 11 refills | Status: DC

## 2019-10-01 NOTE — Progress Notes (Signed)
[id number] AM   [name] [id number]  Referring provider: Fayrene Helper, MD [address], [place],  [place] [address]  Recurrent UTI  HPI: [name] is a 86yo seen for evaluation of recurrent UTIs. She has had 3 culture proven UTIs since 06/2019. Culture has been E coli. She has dysuria and increased urinary frequency with UTI episodes. No hx of nephrolithiasis. She has urinary incontinence and uses 4-5 pads per day which are wet. No gross hematuria   PMH: Past Medical History:  Diagnosis Date  . Arthritis   . Gastroesophageal reflux disease   . Hearing impairment   . Hyperlipidemia   . Hypertension   . Hypothyroidism   . Impaired glucose tolerance   . Macular degeneration   . Pancreatitis   . Paroxysmal atrial fibrillation ([place]) 2011  . Sick sinus syndrome ([place]) 2011   Atrial fibrilation 10/2009; and dual chamber pacemaker in 4/11; normal EF  . Sleep apnea    Nocturnal oxygen therapy  . Zoster 2016    Surgical History: Past Surgical History:  Procedure Laterality Date  . ABDOMINAL HYSTERECTOMY    . CATARACT EXTRACTION     Bilateral  . COLONOSCOPY  2009   Dr. Gala Romney  . EYE SURGERY     laser   . PACEMAKER INSERTION  2011   dual chamber   . PPM GENERATOR CHANGEOUT N/A 11/13/2016   Procedure: PPM Generator Changeout;  Surgeon: Evans Lance, MD;  Location: [facility];  Service: Cardiovascular;  Laterality: N/A;  . SALPINGOOPHORECTOMY  1970   right    Home Medications:  Allergies as of 10/01/2019   No Known Allergies     Medication List       Accurate as of October 01, 2019 11:50 AM. If you have any questions, ask your nurse or doctor.        busPIRone 5 MG tablet Commonly known as: BUSPAR Take 1 tablet (5 mg total) by mouth 2 (two) times daily.   Calcium Carb-Cholecalciferol 500-400 MG-UNIT Chew Chew 1 tablet by mouth daily.   diltiazem 240 MG 24 hr capsule Commonly known as: CARDIZEM CD Take 1 capsule  (240 mg total) by mouth daily.   donepezil 5 MG tablet Commonly known as: Aricept Take 1 tablet (5 mg total) by mouth at bedtime.   EYE VITAMINS & MINERALS PO Take 1 tablet by mouth daily.   fluticasone 50 MCG/ACT nasal spray Commonly known as: FLONASE Place 1 spray into both nostrils daily.   furosemide 80 MG tablet Commonly known as: LASIX Take 1 tablet (80 mg total) by mouth daily.   levothyroxine 112 MCG tablet Commonly known as: SYNTHROID TAKE 1 TABLET BY MOUTH ONCE DAILY. What changed: when to take this   lovastatin 40 MG tablet Commonly known as: MEVACOR Take 1 tablet (40 mg total) by mouth at bedtime.   Macrobid 100 MG capsule Generic drug: nitrofurantoin (macrocrystal-monohydrate) Take 1 capsule (100 mg total) by mouth 2 (two) times daily.   meclizine 12.5 MG tablet Commonly known as: ANTIVERT Take 1 tablet (12.5 mg total) by mouth 2 (two) times daily as needed for dizziness.   metoprolol tartrate 100 MG tablet Commonly known as: LOPRESSOR TAKE 1 TABLET BY MOUTH TWICE A DAY.   omeprazole 40 MG capsule Commonly known as: PRILOSEC TAKE (1) CAPSULE BY MOUTH ONCE DAILY FOR ACID REFLUX.   OXYGEN Inhale 2 L into the lungs at bedtime.   Potassium Gluconate 2.5  MEQ Tabs Take by mouth.   triamcinolone 0.025 % cream Commonly known as: KENALOG APPLY TOPICALLY DAILY FOR 14 DAYS. DO NOT USE ON FACE.   warfarin 5 MG tablet Commonly known as: COUMADIN Take as directed by the anticoagulation clinic. If you are unsure how to take this medication, talk to your nurse or doctor. Original instructions: TAKE 1/2 TABLET BY MOUTH DAILY EXCEPT 1 TABLET ON WEDNESDAYS & SATURDAYS OR AS DIRECTED. What changed:   how much to take  how to take this  when to take this       Allergies: No Known Allergies  Family History: Family History  Problem Relation Age of Onset  . Cancer Mother        gential  . Cancer Father        throat   . Pneumonia Sister   . Emphysema  Sister   . Mitral valve prolapse Sister   . Heart disease Other   . Arthritis Other   . Lung disease Other   . Asthma Other   . Melanoma Son     Social History:  reports that she has never smoked. She has never used smokeless tobacco. She reports that she does not drink alcohol or use drugs.  ROS: All other review of systems were reviewed and are negative except what is noted above in HPI  Physical Exam: BP 111/61   Pulse 80   Temp (!) 95.9 F (35.5 C)   Ht 5\' 6"  (1.676 m)   Wt 155 lb 6.4 oz (70.5 kg)   BMI 25.08 kg/m   Constitutional:  Alert and oriented, No acute distress. HEENT:  AT, moist mucus membranes.  Trachea midline, no masses. Cardiovascular: No clubbing, cyanosis, or edema. Respiratory: Normal respiratory effort, no increased work of breathing. GI: Abdomen is soft, nontender, nondistended, no abdominal masses GU: No CVA tenderness Lymph: No cervical or inguinal lymphadenopathy. Skin: No rashes, bruises or suspicious lesions. Neurologic: Grossly intact, no focal deficits, moving all 4 extremities. Psychiatric: Normal mood and affect.  Laboratory Data: Lab Results  Component Value Date   WBC 9.0 07/08/2019   HGB 10.8 (L) 07/08/2019   HCT 33.0 (L) 07/08/2019   MCV 90.7 07/08/2019   PLT 287 07/08/2019    Lab Results  Component Value Date   CREATININE 0.87 07/08/2019    No results found for: PSA  No results found for: TESTOSTERONE  Lab Results  Component Value Date   HGBA1C 5.6 11/22/2016    Urinalysis    Component Value Date/Time   COLORURINE COLORLESS (A) 08/15/2019 1000   APPEARANCEUR CLEAR 08/15/2019 1000   LABSPEC 1.004 (L) 08/15/2019 1000   PHURINE 7.0 08/15/2019 1000   GLUCOSEU NEGATIVE 08/15/2019 1000   HGBUR MODERATE (A) 08/15/2019 1000   HGBUR trace-lysed 01/11/2010 1458   BILIRUBINUR neg 10/01/2019 1144   KETONESUR negative 09/24/2019 1108   KETONESUR NEGATIVE 08/15/2019 1000   PROTEINUR Negative 10/01/2019 1144   PROTEINUR  NEGATIVE 08/15/2019 1000   UROBILINOGEN negative (A) 10/01/2019 1144   UROBILINOGEN 0.2 02/09/2015 1715   NITRITE neg 10/01/2019 1144   NITRITE NEGATIVE 08/15/2019 1000   LEUKOCYTESUR Small (1+) (A) 10/01/2019 1144   LEUKOCYTESUR NEGATIVE 08/15/2019 1000    Lab Results  Component Value Date   BACTERIA NONE SEEN 08/15/2019    Pertinent Imaging:  No results found for this or any previous visit. Results for orders placed in visit on 12/16/09  US VenoUS Imaging Bilateral   Narrative Clinical Data: Lower extremity edema  VENOUS DUPLEX ULTRASOUND OF BILATERAL LOWER EXTREMITIES   Technique:  Gray-scale sonography with graded compression, as well as color Doppler and duplex ultrasound, were performed to evaluate the deep venous system of both lower extremities from the level of the common femoral vein through the popliteal and proximal calf veins.  Spectral Doppler was utilized to evaluate flow at rest and with distal augmentation maneuvers.   Comparison: None   Findings: Deep venous system is patent and compressible bilaterally from groin to popliteal fossa.  Spontaneous venous flow is present with intact augmentation and evidence of respiratory phasicity.  No intraluminal thrombus identified.  Large slightly irregular anechoic collection identified at right popliteal fossa, question large Baker's cyst.  Visualized portions of the greater saphenous systems are patent.   IMPRESSION: No evidence of deep venous thrombosis in the lower extremities. Question large Baker's cyst right popliteal fossa, recommend clinical correlation.  Provider: Andres Ege   No results found for this or any previous visit. No results found for this or any previous visit. No results found for this or any previous visit. No results found for this or any previous visit. No results found for this or any previous visit. No results found for this or any previous visit.  Assessment & Plan:    1.  Recurrent UTI (urinary tract infection) -We discussed the natural hx of recurrent UTIs and the various causes. We discussed the treatment options. We will start her on nitrofurinatoin 50mg  qhs. RTC 3 months - POCT urinalysis dipstick - BLADDER SCAN AMB NON-IMAGING   No follow-ups on file.  Nicolette Bang, MD  [facility] [place]

## 2019-10-01 NOTE — Progress Notes (Signed)
Urological Symptom Review  Patient is experiencing the following symptoms: Frequent urination Hard to postpone urination Burning/pain with urination Get up at night to urinate Leakage of urine Trouble starting stream Blood in urine Urinary tract infection   Review of Systems  Gastrointestinal (upper)  : Negative for upper GI symptoms  Gastrointestinal (lower) : Negative for lower GI symptoms  Constitutional : Negative for symptoms  Skin: Negative for skin symptoms  Eyes: Negative for eye symptoms  Ear/Nose/Throat : Negative for Ear/Nose/Throat symptoms  Hematologic/Lymphatic: Negative for Hematologic/Lymphatic symptoms  Cardiovascular : Negative for cardiovascular symptoms  Respiratory : Negative for respiratory symptoms  Endocrine: Negative for endocrine symptoms  Musculoskeletal: Negative for musculoskeletal symptoms  Neurological: Negative for neurological symptoms  Psychologic: Negative for psychiatric symptoms

## 2019-10-01 NOTE — Patient Instructions (Signed)
Urinary Tract Infection, Adult A urinary tract infection (UTI) is an infection of any part of the urinary tract. The urinary tract includes:  The kidneys.  The ureters.  The bladder.  The urethra. These organs make, store, and get rid of pee (urine) in the body. What are the causes? This is caused by germs (bacteria) in your genital area. These germs grow and cause swelling (inflammation) of your urinary tract. What increases the risk? You are more likely to develop this condition if:  You have a small, thin tube (catheter) to drain pee.  You cannot control when you pee or poop (incontinence).  You are female, and: ? You use these methods to prevent pregnancy:  A medicine that kills sperm (spermicide).  A device that blocks sperm (diaphragm). ? You have low levels of a female hormone (estrogen). ? You are pregnant.  You have genes that add to your risk.  You are sexually active.  You take antibiotic medicines.  You have trouble peeing because of: ? A prostate that is bigger than normal, if you are female. ? A blockage in the part of your body that drains pee from the bladder (urethra). ? A kidney stone. ? A nerve condition that affects your bladder (neurogenic bladder). ? Not getting enough to drink. ? Not peeing often enough.  You have other conditions, such as: ? Diabetes. ? A weak disease-fighting system (immune system). ? Sickle cell disease. ? Gout. ? Injury of the spine. What are the signs or symptoms? Symptoms of this condition include:  Needing to pee right away (urgently).  Peeing often.  Peeing small amounts often.  Pain or burning when peeing.  Blood in the pee.  Pee that smells bad or not like normal.  Trouble peeing.  Pee that is cloudy.  Fluid coming from the vagina, if you are female.  Pain in the belly or lower back. Other symptoms include:  Throwing up (vomiting).  No urge to eat.  Feeling mixed up (confused).  Being tired  and grouchy (irritable).  A fever.  Watery poop (diarrhea). How is this treated? This condition may be treated with:  Antibiotic medicine.  Other medicines.  Drinking enough water. Follow these instructions at home:  Medicines  Take over-the-counter and prescription medicines only as told by your doctor.  If you were prescribed an antibiotic medicine, take it as told by your doctor. Do not stop taking it even if you start to feel better. General instructions  Make sure you: ? Pee until your bladder is empty. ? Do not hold pee for a long time. ? Empty your bladder after sex. ? Wipe from front to back after pooping if you are a female. Use each tissue one time when you wipe.  Drink enough fluid to keep your pee pale yellow.  Keep all follow-up visits as told by your doctor. This is important. Contact a doctor if:  You do not get better after 1-2 days.  Your symptoms go away and then come back. Get help right away if:  You have very bad back pain.  You have very bad pain in your lower belly.  You have a fever.  You are sick to your stomach (nauseous).  You are throwing up. Summary  A urinary tract infection (UTI) is an infection of any part of the urinary tract.  This condition is caused by germs in your genital area.  There are many risk factors for a UTI. These include having a small, thin   tube to drain pee and not being able to control when you pee or poop.  Treatment includes antibiotic medicines for germs.  Drink enough fluid to keep your pee pale yellow. This information is not intended to replace advice given to you by your health care provider. Make sure you discuss any questions you have with your health care provider. Document Revised: 08/01/2018 Document Reviewed: 02/21/2018 Elsevier Patient Education  2020 [employer] Inc.  

## 2019-10-04 DIAGNOSIS — R0902 Hypoxemia: Secondary | ICD-10-CM | POA: Diagnosis not present

## 2019-10-18 ENCOUNTER — Other Ambulatory Visit: Payer: Self-pay | Admitting: Cardiology

## 2019-10-28 ENCOUNTER — Other Ambulatory Visit: Payer: Self-pay

## 2019-10-28 ENCOUNTER — Ambulatory Visit (INDEPENDENT_AMBULATORY_CARE_PROVIDER_SITE_OTHER): Payer: Medicare Other | Admitting: *Deleted

## 2019-10-28 DIAGNOSIS — Z5181 Encounter for therapeutic drug level monitoring: Secondary | ICD-10-CM

## 2019-10-28 DIAGNOSIS — I480 Paroxysmal atrial fibrillation: Secondary | ICD-10-CM | POA: Diagnosis not present

## 2019-10-28 LAB — POCT INR: INR: 2.7 (ref 2.0–3.0)

## 2019-10-28 NOTE — Patient Instructions (Signed)
Continue warfarin 1/2 tablet daily except 1 tablet on Wednesdays and Saturdays.  Recheck in 6 weeks Keep intake of greens consistent

## 2019-10-30 ENCOUNTER — Ambulatory Visit (INDEPENDENT_AMBULATORY_CARE_PROVIDER_SITE_OTHER): Payer: Medicare Other | Admitting: *Deleted

## 2019-10-30 DIAGNOSIS — Z950 Presence of cardiac pacemaker: Secondary | ICD-10-CM

## 2019-10-30 LAB — CUP PACEART REMOTE DEVICE CHECK
Battery Remaining Longevity: 116 mo
Battery Remaining Percentage: 95.5 %
Battery Voltage: 3.01 V
Brady Statistic RV Percent Paced: 77 %
Date Time Interrogation Session: 20210304134309
Implantable Lead Implant Date: 20110422
Implantable Lead Implant Date: 20110422
Implantable Lead Location: 753859
Implantable Lead Location: 753860
Implantable Pulse Generator Implant Date: 20180319
Lead Channel Impedance Value: 410 Ohm
Lead Channel Pacing Threshold Amplitude: 1 V
Lead Channel Pacing Threshold Pulse Width: 0.5 ms
Lead Channel Sensing Intrinsic Amplitude: 4 mV
Lead Channel Setting Pacing Amplitude: 2 V
Lead Channel Setting Pacing Pulse Width: 0.5 ms
Lead Channel Setting Sensing Sensitivity: 1 mV
Pulse Gen Model: 2272
Pulse Gen Serial Number: 8010902

## 2019-10-31 NOTE — Progress Notes (Signed)
PPM Remote  

## 2019-11-01 DIAGNOSIS — R0902 Hypoxemia: Secondary | ICD-10-CM | POA: Diagnosis not present

## 2019-11-03 ENCOUNTER — Other Ambulatory Visit: Payer: Self-pay | Admitting: Cardiology

## 2019-11-03 ENCOUNTER — Other Ambulatory Visit: Payer: Self-pay | Admitting: Family Medicine

## 2019-11-03 ENCOUNTER — Telehealth: Payer: Self-pay

## 2019-11-03 NOTE — Telephone Encounter (Signed)
[name] is calling to advised that her Mom is hearing old music playing and its a man singing, and it wakes her up. She has woke [name] up [employer] that she had her TV on, and [name] did not. This has been going on for a couple of months . Pt is getting agitated and a little more aggressive.  [name] states its like she is going back in time .Marland Kitchen One time she heard the music in her head for 24 hours, even when she was awake.  [name], would like ONLY Brandi to call her back regarding this.

## 2019-11-03 NOTE — Telephone Encounter (Signed)
I talked to [name] about my own personnel experience with taking care of my parents, and it seemed to shed some light that she is not the only one that is having a hard time with taking care of her parent,  you have to remember You are not Alone.  We all go thru different stages. Sometimes we have to remember the good times, and not so much the bad, we have to step back and see that not every day will be a good day. Things change from day to day, and when routines change for an older person, its hard to get them back in the routine of doing what they used to due pre covid.  [name] thanked me for taking the time to talk with her, and that Velna Hatchet did not need to call her back.

## 2019-11-04 DIAGNOSIS — E559 Vitamin D deficiency, unspecified: Secondary | ICD-10-CM | POA: Diagnosis not present

## 2019-11-04 DIAGNOSIS — N3001 Acute cystitis with hematuria: Secondary | ICD-10-CM | POA: Diagnosis not present

## 2019-11-04 DIAGNOSIS — E785 Hyperlipidemia, unspecified: Secondary | ICD-10-CM | POA: Diagnosis not present

## 2019-11-04 DIAGNOSIS — I10 Essential (primary) hypertension: Secondary | ICD-10-CM | POA: Diagnosis not present

## 2019-11-05 LAB — CBC
HCT: 31.7 % — ABNORMAL LOW (ref 35.0–45.0)
Hemoglobin: 10.7 g/dL — ABNORMAL LOW (ref 11.7–15.5)
MCH: 30.1 pg (ref 27.0–33.0)
MCHC: 33.8 g/dL (ref 32.0–36.0)
MCV: 89 fL (ref 80.0–100.0)
MPV: 9.2 fL (ref 7.5–12.5)
Platelets: 290 10*3/uL (ref 140–400)
RBC: 3.56 10*6/uL — ABNORMAL LOW (ref 3.80–5.10)
RDW: 12.2 % (ref 11.0–15.0)
WBC: 7 10*3/uL (ref 3.8–10.8)

## 2019-11-05 LAB — COMPLETE METABOLIC PANEL WITH GFR
AG Ratio: 1.3 (calc) (ref 1.0–2.5)
ALT: 9 U/L (ref 6–29)
AST: 14 U/L (ref 10–35)
Albumin: 3.9 g/dL (ref 3.6–5.1)
Alkaline phosphatase (APISO): 88 U/L (ref 37–153)
BUN/Creatinine Ratio: 17 (calc) (ref 6–22)
BUN: 20 mg/dL (ref 7–25)
CO2: 30 mmol/L (ref 20–32)
Calcium: 9.3 mg/dL (ref 8.6–10.4)
Chloride: 100 mmol/L (ref 98–110)
Creat: 1.21 mg/dL — ABNORMAL HIGH (ref 0.60–0.88)
GFR, Est African American: 45 mL/min/{1.73_m2} — ABNORMAL LOW (ref >=60)
GFR, Est Non African American: 39 mL/min/{1.73_m2} — ABNORMAL LOW (ref >=60)
Globulin: 3 g/dL (calc) (ref 1.9–3.7)
Glucose, Bld: 109 mg/dL — ABNORMAL HIGH (ref 65–99)
Potassium: 4 mmol/L (ref 3.5–5.3)
Sodium: 138 mmol/L (ref 135–146)
Total Bilirubin: 0.5 mg/dL (ref 0.2–1.2)
Total Protein: 6.9 g/dL (ref 6.1–8.1)

## 2019-11-05 LAB — VITAMIN D 25 HYDROXY (VIT D DEFICIENCY, FRACTURES): Vit D, 25-Hydroxy: 54 ng/mL (ref 30–100)

## 2019-11-05 LAB — LIPID PANEL
Cholesterol: 132 mg/dL (ref <200)
HDL: 63 mg/dL (ref >=50)
LDL Cholesterol (Calc): 50 mg/dL (calc)
Non-HDL Cholesterol (Calc): 69 mg/dL (calc) (ref <130)
Total CHOL/HDL Ratio: 2.1 (calc) (ref <5.0)
Triglycerides: 105 mg/dL (ref <150)

## 2019-11-05 LAB — TSH: TSH: 2.88 mIU/L (ref 0.40–4.50)

## 2019-11-07 LAB — URINE CULTURE
MICRO NUMBER:: 10232536
SPECIMEN QUALITY:: ADEQUATE

## 2019-11-10 ENCOUNTER — Other Ambulatory Visit: Payer: Self-pay | Admitting: Family Medicine

## 2019-11-10 MED ORDER — AMPICILLIN 500 MG PO CAPS: 500.0000 mg | ORAL_CAPSULE | Freq: Three times a day (TID) | ORAL | 0 refills | Status: DC

## 2019-11-11 ENCOUNTER — Ambulatory Visit (INDEPENDENT_AMBULATORY_CARE_PROVIDER_SITE_OTHER): Payer: Medicare Other | Admitting: Cardiology

## 2019-11-11 ENCOUNTER — Encounter: Payer: Self-pay | Admitting: Cardiology

## 2019-11-11 VITALS — BP 118/64 | HR 104 | Temp 97.2°F | Ht 66.0 in | Wt 158.0 lb

## 2019-11-11 DIAGNOSIS — R001 Bradycardia, unspecified: Secondary | ICD-10-CM

## 2019-11-11 DIAGNOSIS — I480 Paroxysmal atrial fibrillation: Secondary | ICD-10-CM | POA: Diagnosis not present

## 2019-11-11 DIAGNOSIS — I10 Essential (primary) hypertension: Secondary | ICD-10-CM

## 2019-11-11 NOTE — Patient Instructions (Signed)
Medication Instructions:  Your physician recommends that you continue on your current medications as directed. Please refer to the Current Medication list given to you today.   Labwork: 3 WEEKS  BMET MAGNESIUM  Testing/Procedures: NONE  Follow-Up: Your physician wants you to follow-up in: 6 MONTHS.  You will receive a reminder letter in the mail two months in advance. If you don't receive a letter, please call our office to schedule the follow-up appointment.   Any Other Special Instructions Will Be Listed Below (If Applicable).     If you need a refill on your cardiac medications before your next appointment, please call your pharmacy.

## 2019-11-11 NOTE — Progress Notes (Signed)
Clinical Summary [name] is a 86 y.o.female  seen today for follow up of the following medical problems   1. Paroxysmal afib -Has not been interested in NOACs.   - admission 06/2019 with afib with RVR. Was on dilt gtt, converted to oral dilt 120mg  daily and discharged - in general patient has not had any symptoms. The tachycardia was noted by a home vitals check prior to admission with rates 130s - some recurrent asymptomatic tachycardia noted at home, patient brought to ER yesterday. Cardizem increased to 180mg , discharged from ER.    - occasional palpitations. Infrequent - no bleeding on coumadin.    2. Symptomatic bradycardia 10/30/19 normal device check.   3. HTN  HCTZ stopped during 06/2019 admit with hyponatremia - compliant with meds    4. Hyperlipidemia - 02/2018 TC 132 HDL 42 TG 195 LDL 63 -she is compliant with statin 10/2019 TC 132 HDL 63 TG 105 LDL 50  5. Edema - home weights 155-158 lbs, taking lasix 80mg  daily recently increased - no recent edema. No SOB/DOE .    SH: daughter of [name] who is also a patient of mine. She has a great grandsone here with her today started kindergarten, a great grandaughter who is 28 yos   Past Medical History:  Diagnosis Date  . Anxiety   . Arthritis   . Arthritis   . Asthma   . Atrial fibrillation ([place])   . Bleeding disorder ([place])   . Gastroesophageal reflux disease   . Hearing impairment   . Heart disease   . Heart murmur   . High cholesterol   . Hyperlipidemia   . Hypertension   . Hypothyroidism   . Impaired glucose tolerance   . Macular degeneration   . Pancreatitis   . Paroxysmal atrial fibrillation ([place]) 2011  . Sick sinus syndrome ([place]) 2011   Atrial fibrilation 10/2009; and dual chamber pacemaker in 4/11; normal EF  . Sleep apnea    Nocturnal oxygen therapy  . Zoster 2016     No Known Allergies   Current Outpatient Medications  Medication Sig Dispense  Refill  . ampicillin (PRINCIPEN) 500 MG capsule Take 1 capsule (500 mg total) by mouth 3 (three) times daily. 15 capsule 0  . busPIRone (BUSPAR) 5 MG tablet TAKE 1 TABLET BY MOUTH TWICE DAILY 60 tablet 5  . Calcium Carb-Cholecalciferol 500-400 MG-UNIT CHEW Chew 1 tablet by mouth daily.  60 tablet   . diltiazem (CARDIZEM CD) 240 MG 24 hr capsule Take 1 capsule (240 mg total) by mouth daily. 30 capsule 11  . donepezil (ARICEPT) 5 MG tablet Take 1 tablet (5 mg total) by mouth at bedtime. 30 tablet 3  . fluticasone (FLONASE) 50 MCG/ACT nasal spray Place 1 spray into both nostrils daily.    . furosemide (LASIX) 80 MG tablet Take 1 tablet (80 mg total) by mouth daily. 90 tablet 3  . levothyroxine (SYNTHROID) 112 MCG tablet TAKE 1 TABLET BY MOUTH ONCE DAILY. (Patient taking differently: Take 112 mcg by mouth daily before breakfast. ) 90 tablet 1  . lovastatin (MEVACOR) 40 MG tablet TAKE ONE TABLET BY MOUTH AT BEDTIME. 90 tablet 0  . meclizine (ANTIVERT) 12.5 MG tablet Take 1 tablet (12.5 mg total) by mouth 2 (two) times daily as needed for dizziness. 30 tablet 0  . metoprolol tartrate (LOPRESSOR) 100 MG tablet TAKE 1 TABLET BY MOUTH TWICE A DAY. 180 tablet 0  . Multiple Vitamins-Minerals (EYE VITAMINS & MINERALS  PO) Take 1 tablet by mouth daily.    . nitrofurantoin (MACRODANTIN) 50 MG capsule Take 1 capsule (50 mg total) by mouth at bedtime. 30 capsule 11  . nitrofurantoin, macrocrystal-monohydrate, (MACROBID) 100 MG capsule Take 1 capsule (100 mg total) by mouth 2 (two) times daily. 14 capsule 0  . omeprazole (PRILOSEC) 40 MG capsule TAKE (1) CAPSULE BY MOUTH ONCE DAILY FOR ACID REFLUX. 30 capsule 4  . OXYGEN-HELIUM IN Inhale 2 L into the lungs at bedtime.    . Potassium Gluconate 2.5 MEQ TABS Take by mouth.    . triamcinolone (KENALOG) 0.025 % cream APPLY TOPICALLY DAILY FOR 14 DAYS. DO NOT USE ON FACE.    Marland Kitchen warfarin (COUMADIN) 5 MG tablet TAKE 1/2 TABLET BY MOUTH DAILY EXCEPT 1 TABLET ON WEDNESDAYS &  SATURDAYS OR AS DIRECTED. 25 tablet 6   No current facility-administered medications for this visit.     Past Surgical History:  Procedure Laterality Date  . ABDOMINAL HYSTERECTOMY    . CATARACT EXTRACTION     Bilateral  . COLONOSCOPY  2009   Dr. Gala Underwood  . EYE SURGERY     laser   . PACEMAKER INSERTION  2011   dual chamber   . PPM GENERATOR CHANGEOUT N/A 11/13/2016   Procedure: PPM Generator Changeout;  Surgeon: Hayley Lance, MD;  Location: [facility];  Service: Cardiovascular;  Laterality: N/A;  . SALPINGOOPHORECTOMY  1970   right     No Known Allergies    Family History  Problem Relation Age of Onset  . Cancer Mother        gential  . Cancer Father        throat   . Pneumonia Sister   . Emphysema Sister   . Mitral valve prolapse Sister   . Heart disease Other   . Arthritis Other   . Lung disease Other   . Asthma Other   . Melanoma Son      Social History [name] reports that she has never smoked. She has never used smokeless tobacco. [name] reports no history of alcohol use.   Review of Systems CONSTITUTIONAL: No weight loss, fever, chills, weakness or fatigue.  HEENT: Eyes: No visual loss, blurred vision, double vision or yellow sclerae.No hearing loss, sneezing, congestion, runny nose or sore throat.  SKIN: No rash or itching.  CARDIOVASCULAR: per hpi RESPIRATORY: No shortness of breath, cough or sputum.  GASTROINTESTINAL: No anorexia, nausea, vomiting or diarrhea. No abdominal pain or blood.  GENITOURINARY: No burning on urination, no polyuria NEUROLOGICAL: No headache, dizziness, syncope, paralysis, ataxia, numbness or tingling in the extremities. No change in bowel or bladder control.  MUSCULOSKELETAL: No muscle, back pain, joint pain or stiffness.  LYMPHATICS: No enlarged nodes. No history of splenectomy.  PSYCHIATRIC: No history of depression or anxiety.  ENDOCRINOLOGIC: No reports of sweating, cold or heat intolerance. No polyuria  or polydipsia.  Marland Kitchen   Physical Examination Today's Vitals   11/11/19 1054  BP: 118/64  Pulse: (!) 104  Temp: (!) 97.2 F (36.2 C)  SpO2: 98%  Weight: 158 lb (71.7 kg)  Height: 5\' 6"  (1.676 m)   Body mass index is 25.5 kg/m.  Gen: resting comfortably, no acute distress HEENT: no scleral icterus, pupils equal round and reactive, no palptable cervical adenopathy,  CV: irreg, no m/r/g, no jvd Resp: Clear to auscultation bilaterally GI: abdomen is soft, non-tender, non-distended, normal bowel sounds, no hepatosplenomegaly MSK: extremities are warm, no edema.  Skin: warm, no rash Neuro:  no focal deficits Psych: appropriate affect   Diagnostic Studies 07/04/19 echo IMPRESSIONS   1. Left ventricular ejection fraction, by visual estimation, is 65 to 70%. The left ventricle has normal function. There is mildly increased left ventricular hypertrophy. 2. Left ventricular diastolic parameters are indeterminate in the setting of atrial fibrillation. 3. Global right ventricle has normal systolic function.The right ventricular size is mildly enlarged. No increase in right ventricular wall thickness. 4. Left atrial size was upper normal. 5. Right atrial size was upper normal. 6. Mild mitral annular calcification. 7. Moderate aortic valve annular calcification. 8. The mitral valve is grossly normal. Mild mitral valve regurgitation. 9. The tricuspid valve is grossly normal. Tricuspid valve regurgitation is trivial. 10. The aortic valve is tricuspid. Aortic valve regurgitation is not visualized. Mild aortic valve sclerosis without stenosis. 11. The pulmonic valve was grossly normal. Pulmonic valve regurgitation is trivial. 12. A pacer wire is visualized in the RV and RA. 13. The inferior vena cava is normal in size with greater than 50% respiratory variability, suggesting right atrial pressure of 3 mmHg. 14. Mildly elevated pulmonary artery systolic pressure. 15. The tricuspid  regurgitant velocity is 2.71 m/s, and with an assumed right atrial pressure of 3 mmHg, the estimated right ventricular systolic pressure is mildly elevated at 32.4 mmHg.     Assessment and Plan   1. Afib - no recent symptoms, continue current meds - will make coumadin clinic aware she is starting ampicillin.   2. HTN -she is at goal, continue current meds  3. Symptomatic bradycardia/Pacemaker - recent normal device check, no symptoms. - continue to monitor  4. LE edema - improved with lasix 80mg  daily. Mild uptrend in Cr, unclear if related to diuretic or current UTI - repeat BMET/Mg in 3 weeks  F/u 6 months   Arnoldo Lenis, M.D.

## 2019-11-12 ENCOUNTER — Telehealth: Payer: Self-pay | Admitting: *Deleted

## 2019-11-12 NOTE — Telephone Encounter (Signed)
Called and spoke to daughter Cecille Rubin.  OK for patient to take ampicillin 3 x day x 5 days for UTI along with warfarin.  Keep scheduled INR appt.

## 2019-11-12 NOTE — Telephone Encounter (Signed)
-----   Message from Arnoldo Lenis, MD sent at 11/11/2019 11:29 AM EDT ----- Lattie Haw, mutual patient of ours. As FYI she is getting to start a course of ampicillin for a UTI, not sure if that would affect her coumadin f/u for you  Zandra Abts MD

## 2019-11-13 ENCOUNTER — Ambulatory Visit (INDEPENDENT_AMBULATORY_CARE_PROVIDER_SITE_OTHER): Payer: Medicare Other | Admitting: Family Medicine

## 2019-11-13 ENCOUNTER — Other Ambulatory Visit: Payer: Self-pay

## 2019-11-13 ENCOUNTER — Encounter: Payer: Self-pay | Admitting: Family Medicine

## 2019-11-13 VITALS — BP 122/62 | HR 97 | Temp 97.4°F | Resp 15 | Ht 66.0 in | Wt 161.2 lb

## 2019-11-13 DIAGNOSIS — Z9181 History of falling: Secondary | ICD-10-CM

## 2019-11-13 DIAGNOSIS — E038 Other specified hypothyroidism: Secondary | ICD-10-CM

## 2019-11-13 DIAGNOSIS — E785 Hyperlipidemia, unspecified: Secondary | ICD-10-CM

## 2019-11-13 DIAGNOSIS — I10 Essential (primary) hypertension: Secondary | ICD-10-CM

## 2019-11-13 DIAGNOSIS — J302 Other seasonal allergic rhinitis: Secondary | ICD-10-CM

## 2019-11-13 DIAGNOSIS — R413 Other amnesia: Secondary | ICD-10-CM

## 2019-11-13 DIAGNOSIS — G4734 Idiopathic sleep related nonobstructive alveolar hypoventilation: Secondary | ICD-10-CM

## 2019-11-13 MED ORDER — LORATADINE 10 MG PO TABS: 10.0000 mg | ORAL_TABLET | Freq: Every day | ORAL | 1 refills | Status: DC

## 2019-11-13 NOTE — Assessment & Plan Note (Addendum)
MMSE repeated today, unchanged 29 score same as previous Aricept is going well, but unsure if causing "music in head" Can try to stop medication and see if music stops. Rather her sleep well then be frustrated by this nonstop music. Unsure if related to medication or memory changes.

## 2019-11-13 NOTE — Assessment & Plan Note (Signed)
[name] is encouraged to maintain a well balanced diet that is low in salt. Controlled, continue current medication regimen.  Additionally, she is also reminded that exercise is beneficial for heart health and control of  Blood pressure. 30-60 minutes daily is recommended-walking was suggested.

## 2019-11-13 NOTE — Assessment & Plan Note (Signed)
Not well controlled, continue Flonase as needed and start Claritin daily

## 2019-11-13 NOTE — Assessment & Plan Note (Signed)
Lipid profile controlled On statin, might be able to wean, advised to talk to Cardiologist about this. To reduce pill burden if able.

## 2019-11-13 NOTE — Assessment & Plan Note (Signed)
TSH in normal range 10/2019 Continue current dose of medication

## 2019-11-13 NOTE — Assessment & Plan Note (Signed)
Use of walker, high fall risk.  No falls reported this visit.

## 2019-11-13 NOTE — Progress Notes (Addendum)
Subjective:  Patient ID: [name], female    DOB: 1926/02/06  Age: 86 y.o. MRN: [id number]  CC:  Chief Complaint  Patient presents with  . Allergies    wants to know what she can take for allergies that won't interact with her other medications.       HPI  HPI  Ms [name] is a 86 year old female patient of Dr. Griffin Dakin.  Who presents today for follow-up from 6 to 8 weeks for a repeat Mini-Mental status exam.  Was started on Aricept 5 mg.  Today she reports that she is taking the medication without issue but she thinks that she is started having music playing in her head and it seems rather loud and she does not know how to make it stop.  She reports sometimes it interferes with her sleeping.  She thinks it is related to starting the medication.  Daughter is accompanying her today and does not think that it is related and thinks that it just might be a memory issue and she would have the problem even if she was not taking the medication.  Daughter reports that she thinks the medication has helped her in remembering things.  Reports that she is eating well and drinking a good amount of water.  Reports that she has no skin issues trying to maintain good hygiene.  Is being seen by urologist and is on low-dose Macrobid to help with maintenance of chronic UTIs.  Reports that she has had some allergy issues Flonase takes a lot of work.  Wondered if there is anything that she can take on a daily basis.  Additionally reports that she has some ear popping with this she reports that it kind of started a couple weeks ago but also thinks that she talk to Dr. Moshe Cipro about in the past.  Has a walker today utilizes it well.  Has not had any falls since her last event.  Wears her oxygen at night. She suffers from nocturnal hypoxia and will continue to need lifetime oxygen at night.   Today patient denies signs and symptoms of COVID 19 infection including fever, chills, cough, shortness of breath,  and headache. Past Medical, Surgical, Social History, Allergies, and Medications have been Reviewed.   Past Medical History:  Diagnosis Date  . Annual physical exam 04/22/2015  . Anxiety   . Arthritis   . Arthritis   . Asthma   . Atrial fibrillation ([place])   . Bleeding disorder ([place])   . Gastroesophageal reflux disease   . Hearing impairment   . Heart disease   . Heart murmur   . High cholesterol   . Hyperlipidemia   . Hypertension   . Hypothyroidism   . Impaired glucose tolerance   . Macular degeneration   . Pancreatitis   . Paroxysmal atrial fibrillation ([place]) 2011  . Sick sinus syndrome ([place]) 2011   Atrial fibrilation 10/2009; and dual chamber pacemaker in 4/11; normal EF  . Sleep apnea    Nocturnal oxygen therapy  . Zoster 2016    Current Meds  Medication Sig  . busPIRone (BUSPAR) 5 MG tablet TAKE 1 TABLET BY MOUTH TWICE DAILY  . Calcium Carb-Cholecalciferol 500-400 MG-UNIT CHEW Chew 1 tablet by mouth daily.   Marland Kitchen diltiazem (CARDIZEM CD) 240 MG 24 hr capsule Take 1 capsule (240 mg total) by mouth daily.  . fluticasone (FLONASE) 50 MCG/ACT nasal spray Place 1 spray into both nostrils daily.  Marland Kitchen lovastatin (MEVACOR) 40 MG  tablet TAKE ONE TABLET BY MOUTH AT BEDTIME.  . meclizine (ANTIVERT) 12.5 MG tablet Take 1 tablet (12.5 mg total) by mouth 2 (two) times daily as needed for dizziness.  . metoprolol tartrate (LOPRESSOR) 100 MG tablet TAKE 1 TABLET BY MOUTH TWICE A DAY.  . Multiple Vitamins-Minerals (EYE VITAMINS & MINERALS PO) Take 1 tablet by mouth daily.  . nitrofurantoin (MACRODANTIN) 50 MG capsule Take 1 capsule (50 mg total) by mouth at bedtime.  . OXYGEN-HELIUM IN Inhale 2 L into the lungs at bedtime.  . Potassium Gluconate 2.5 MEQ TABS Take by mouth.  . warfarin (COUMADIN) 5 MG tablet TAKE 1/2 TABLET BY MOUTH DAILY EXCEPT 1 TABLET ON WEDNESDAYS & SATURDAYS OR AS DIRECTED.  . [DISCONTINUED] ampicillin (PRINCIPEN) 500 MG capsule Take 1 capsule (500 mg total) by mouth 3  (three) times daily.  . [DISCONTINUED] donepezil (ARICEPT) 5 MG tablet Take 1 tablet (5 mg total) by mouth at bedtime.  . [DISCONTINUED] furosemide (LASIX) 80 MG tablet Take 1 tablet (80 mg total) by mouth daily.  . [DISCONTINUED] levothyroxine (SYNTHROID) 112 MCG tablet TAKE 1 TABLET BY MOUTH ONCE DAILY. (Patient taking differently: Take 112 mcg by mouth daily before breakfast. )  . [DISCONTINUED] nitrofurantoin, macrocrystal-monohydrate, (MACROBID) 100 MG capsule Take 1 capsule (100 mg total) by mouth 2 (two) times daily.  . [DISCONTINUED] omeprazole (PRILOSEC) 40 MG capsule TAKE (1) CAPSULE BY MOUTH ONCE DAILY FOR ACID REFLUX.    ROS:  Review of Systems  Constitutional: Negative.        See HPI  HENT: Negative.   Eyes: Negative.   Respiratory: Negative.   Cardiovascular: Negative.   Gastrointestinal: Negative.   Genitourinary: Negative.   Musculoskeletal: Negative.   Skin: Negative.   Neurological: Negative.   Endo/Heme/Allergies: Negative.   Psychiatric/Behavioral: Negative.   All other systems reviewed and are negative.    Objective:   Today's Vitals: BP 122/62   Pulse 97   Temp (!) 97.4 F (36.3 C) (Temporal)   Resp 15   Ht 5\' 6"  (1.676 m)   Wt 161 lb 3.2 oz (73.1 kg)   SpO2 96%   BMI 26.02 kg/m  Vitals with BMI 12/26/2019 12/15/2019 11/13/2019  Height - 5\' 6"  5\' 6"   Weight - 163 lbs 13 oz 161 lbs 3 oz  BMI - 89.37 34.28  Systolic 768 115 726  Diastolic 78 70 62  Pulse 89 86 97     Physical Exam Vitals and nursing note reviewed.  Constitutional:      Appearance: Normal appearance. She is well-developed, well-groomed and overweight.  HENT:     Head: Normocephalic and atraumatic.     Right Ear: Hearing, tympanic membrane, ear canal and external ear normal.     Left Ear: Hearing, tympanic membrane, ear canal and external ear normal.     Mouth/Throat:     Comments: Mask in place  Eyes:     General:        Right eye: No discharge.        Left eye: No  discharge.     Conjunctiva/sclera: Conjunctivae normal.  Cardiovascular:     Rate and Rhythm: Normal rate and regular rhythm.     Pulses: Normal pulses.     Heart sounds: Normal heart sounds.  Pulmonary:     Effort: Pulmonary effort is normal.     Breath sounds: Normal breath sounds.  Musculoskeletal:        General: Normal range of motion.  Cervical back: Normal range of motion and neck supple.  Skin:    General: Skin is warm.  Neurological:     General: No focal deficit present.     Mental Status: She is alert and oriented to person, place, and time.  Psychiatric:        Attention and Perception: Attention normal.        Mood and Affect: Mood normal.        Speech: Speech normal.        Behavior: Behavior normal. Behavior is cooperative.        Thought Content: Thought content normal.        Cognition and Memory: Cognition normal.        Judgment: Judgment normal.     Assessment   1. Seasonal allergies   2. Essential hypertension   3. Other specified hypothyroidism   4. Hyperlipidemia, unspecified hyperlipidemia type   5. At high risk for falls   6. Impaired memory   7. Nocturnal hypoxia     Tests ordered No orders of the defined types were placed in this encounter.  Plan: Please see assessment and plan per problem list above.   Meds ordered this encounter  Medications  . loratadine (CLARITIN) 10 MG tablet    Sig: Take 1 tablet (10 mg total) by mouth daily.    Dispense:  30 tablet    Refill:  1    Order Specific Question:   Supervising Provider    Answer:   Moshe Cipro, MARGARET E [[id number]]   30 mins of face to face time  Patient to follow-up in 4 months    Perlie Mayo, NP

## 2019-11-13 NOTE — Patient Instructions (Addendum)
I appreciate the opportunity to provide you with care for your health and wellness. Today we discussed: Memory and Allergies  Follow up: 4 months   No labs or referrals today  Can do trial off Aricept if that is causing disturbstance in sleep. If nothing improves off medicine, you can resume it.  Start Claritin daily (you can take it at anytime, just be consistent with the time of day)  Please continue to practice social distancing to keep you, your family, and our community safe.  If you must go out, please wear a mask and practice good handwashing.  It was a pleasure to see you and I look forward to continuing to work together on your health and well-being. Please do not hesitate to call the office if you need care or have questions about your care.  Have a wonderful day and week. With Gratitude, Cherly Beach, DNP, AGNP-BC

## 2019-12-02 DIAGNOSIS — R0902 Hypoxemia: Secondary | ICD-10-CM | POA: Diagnosis not present

## 2019-12-09 ENCOUNTER — Ambulatory Visit (INDEPENDENT_AMBULATORY_CARE_PROVIDER_SITE_OTHER): Payer: Medicare Other | Admitting: *Deleted

## 2019-12-09 ENCOUNTER — Other Ambulatory Visit: Payer: Self-pay

## 2019-12-09 DIAGNOSIS — Z5181 Encounter for therapeutic drug level monitoring: Secondary | ICD-10-CM | POA: Diagnosis not present

## 2019-12-09 DIAGNOSIS — I480 Paroxysmal atrial fibrillation: Secondary | ICD-10-CM | POA: Diagnosis not present

## 2019-12-09 LAB — POCT INR: INR: 3 (ref 2.0–3.0)

## 2019-12-09 NOTE — Patient Instructions (Signed)
Continue warfarin 1/2 tablet daily except 1 tablet on Wednesdays and Saturdays.  Recheck in 6 weeks Keep intake of greens consistent

## 2019-12-12 ENCOUNTER — Other Ambulatory Visit: Payer: Self-pay | Admitting: *Deleted

## 2019-12-12 DIAGNOSIS — I480 Paroxysmal atrial fibrillation: Secondary | ICD-10-CM | POA: Diagnosis not present

## 2019-12-12 DIAGNOSIS — R300 Dysuria: Secondary | ICD-10-CM | POA: Diagnosis not present

## 2019-12-13 LAB — BASIC METABOLIC PANEL
BUN/Creatinine Ratio: 22 (calc) (ref 6–22)
BUN: 29 mg/dL — ABNORMAL HIGH (ref 7–25)
CO2: 29 mmol/L (ref 20–32)
Calcium: 9.7 mg/dL (ref 8.6–10.4)
Chloride: 99 mmol/L (ref 98–110)
Creat: 1.3 mg/dL — ABNORMAL HIGH (ref 0.60–0.88)
Glucose, Bld: 83 mg/dL (ref 65–139)
Potassium: 3.8 mmol/L (ref 3.5–5.3)
Sodium: 137 mmol/L (ref 135–146)

## 2019-12-13 LAB — MAGNESIUM: Magnesium: 1.9 mg/dL (ref 1.5–2.5)

## 2019-12-15 ENCOUNTER — Encounter: Payer: Self-pay | Admitting: Family Medicine

## 2019-12-15 ENCOUNTER — Other Ambulatory Visit: Payer: Self-pay

## 2019-12-15 ENCOUNTER — Ambulatory Visit (INDEPENDENT_AMBULATORY_CARE_PROVIDER_SITE_OTHER): Payer: Medicare Other | Admitting: Family Medicine

## 2019-12-15 VITALS — BP 111/70 | HR 86 | Temp 97.8°F | Ht 66.0 in | Wt 163.8 lb

## 2019-12-15 DIAGNOSIS — N390 Urinary tract infection, site not specified: Secondary | ICD-10-CM | POA: Diagnosis not present

## 2019-12-15 MED ORDER — AMPICILLIN 500 MG PO CAPS: 500.0000 mg | ORAL_CAPSULE | Freq: Three times a day (TID) | ORAL | 0 refills | Status: DC

## 2019-12-15 NOTE — Patient Instructions (Addendum)
  Ampicillin -take all 3 doses today Drink plenty of water We will call when culture when available   If you have lab work done today you will be contacted with your lab results within the next 2 weeks.  If you have not heard from [place] then please contact us. The fastest way to get your results is to register for My Chart.   IF you received an x-ray today, you will receive an invoice from [facility]. Please contact [facility] at [phone] with questions or concerns regarding your invoice.   IF you received labwork today, you will receive an invoice from [facility]. Please contact [facility] at [phone] with questions or concerns regarding your invoice.   Our billing staff will not be able to assist you with questions regarding bills from these companies.  You will be contacted with the lab results as soon as they are available. The fastest way to get your results is to activate your My Chart account. Instructions are located on the last page of this paperwork. If you have not heard from [place] regarding the results in 2 weeks, please contact this office.

## 2019-12-15 NOTE — Progress Notes (Signed)
Acute Office Visit  Subjective:    Patient ID: [name], female    DOB: 12-18-1925, 86 y.o.   MRN: [id number]  Chief Complaint  Patient presents with  . Follow-up    F/u from 12/12/19 visit for UTI. Patient had mod bacteria in urine. [name] is not at the office to put patient on medication fpr the uti. Ampicillin has worked for the patient in the past  Assessment & Plan:  urology 2/21-f/u appt 5/21  1. Recurrent UTI (urinary tract infection) -We discussed the natural hx of recurrent UTIs and the various causes. We discussed the treatment options. We will start her on nitrofurinatoin 50mg  qhs. RTC 3 months - POCT urinalysis dipstick - BLADDER SCAN AMB NON-IMAGING  HPI Patient is in today for for medication -positive urine. Pt previously treated for UTI and was taking daily macrobid. Pt taken off medication by cardiology and urology. Pt to follow up in 2 weeks with urology. Pt took macrobid daily. Previously took Ampicillin for UTI.  Pt with incontinence of urine -takes lasix and unable to make it to the bathroom. Daughter stays with pt who has dementia. No fever. No abdominal pain. +odor with urination. NO blood noted.  11/20-ecoli-sensitive to macrobid 12/20-ecoli-sensitve to bactrim 1/21-e coli-sensitive to macrobid 3/21-e coli-sensitive to ampicillin-pt on daily macrobid-since kidney function decreased advised to d/c daily macrobid-BMP repeat recommended then follow up with urology  4/16 Cr 1.30 4/16 Gram negative bacilli isolated Past Medical History:  Diagnosis Date  . Annual physical exam 04/22/2015  . Anxiety   . Arthritis   . Arthritis   . Asthma   . Atrial fibrillation ([place])   . Bleeding disorder ([place])   . Gastroesophageal reflux disease   . Hearing impairment   . Heart disease   . Heart murmur   . High cholesterol   . Hyperlipidemia   . Hypertension   . Hypothyroidism   . Impaired glucose tolerance   . Macular degeneration   . Pancreatitis   . Paroxysmal  atrial fibrillation ([place]) 2011  . Sick sinus syndrome ([place]) 2011   Atrial fibrilation 10/2009; and dual chamber pacemaker in 4/11; normal EF  . Sleep apnea    Nocturnal oxygen therapy  . Zoster 2016    Past Surgical History:  Procedure Laterality Date  . ABDOMINAL HYSTERECTOMY    . CATARACT EXTRACTION     Bilateral  . COLONOSCOPY  2009   Dr. Gala Romney  . EYE SURGERY     laser   . PACEMAKER INSERTION  2011   dual chamber   . PPM GENERATOR CHANGEOUT N/A 11/13/2016   Procedure: PPM Generator Changeout;  Surgeon: Evans Lance, MD;  Location: [facility];  Service: Cardiovascular;  Laterality: N/A;  . SALPINGOOPHORECTOMY  1970   right    Family History  Problem Relation Age of Onset  . Cancer Mother        gential  . Cancer Father        throat   . Pneumonia Sister   . Emphysema Sister   . Mitral valve prolapse Sister   . Heart disease Other   . Arthritis Other   . Lung disease Other   . Asthma Other   . Melanoma Son     Social History   Socioeconomic History  . Marital status: Widowed    Spouse name: Not on file  . Number of children: 7  . Years of education: college  . Highest education level: Not on  file  Occupational History  . Occupation: retired     Fish farm manager: RETIRED  Tobacco Use  . Smoking status: Never Smoker  . Smokeless tobacco: Never Used  Substance and Sexual Activity  . Alcohol use: No    Alcohol/week: 0.0 standard drinks  . Drug use: No  . Sexual activity: Not Currently  Other Topics Concern  . Not on file  Social History Narrative  . Not on file   Social Determinants of Health   Financial Resource Strain:   . Difficulty of Paying Living Expenses:   Food Insecurity:   . Worried About Charity fundraiser in the Last Year:   . Arboriculturist in the Last Year:   Transportation Needs:   . Film/video editor (Medical):   Marland Kitchen Lack of Transportation (Non-Medical):   Physical Activity:   . Days of Exercise per Week:   . Minutes of  Exercise per Session:   Stress:   . Feeling of Stress :   Social Connections:   . Frequency of Communication with Friends and Family:   . Frequency of Social Gatherings with Friends and Family:   . Attends Religious Services:   . Active Member of Clubs or Organizations:   . Attends Archivist Meetings:   Marland Kitchen Marital Status:   Intimate Partner Violence:   . Fear of Current or Ex-Partner:   . Emotionally Abused:   Marland Kitchen Physically Abused:   . Sexually Abused:     Outpatient Medications Prior to Visit  Medication Sig Dispense Refill  . ampicillin (PRINCIPEN) 500 MG capsule Take 1 capsule (500 mg total) by mouth 3 (three) times daily. 15 capsule 0  . busPIRone (BUSPAR) 5 MG tablet TAKE 1 TABLET BY MOUTH TWICE DAILY 60 tablet 5  . Calcium Carb-Cholecalciferol 500-400 MG-UNIT CHEW Chew 1 tablet by mouth daily.  60 tablet   . diltiazem (CARDIZEM CD) 240 MG 24 hr capsule Take 1 capsule (240 mg total) by mouth daily. 30 capsule 11  . donepezil (ARICEPT) 5 MG tablet Take 1 tablet (5 mg total) by mouth at bedtime. 30 tablet 3  . fluticasone (FLONASE) 50 MCG/ACT nasal spray Place 1 spray into both nostrils daily.    Marland Kitchen levothyroxine (SYNTHROID) 112 MCG tablet TAKE 1 TABLET BY MOUTH ONCE DAILY. (Patient taking differently: Take 112 mcg by mouth daily before breakfast. ) 90 tablet 1  . loratadine (CLARITIN) 10 MG tablet Take 1 tablet (10 mg total) by mouth daily. 30 tablet 1  . lovastatin (MEVACOR) 40 MG tablet TAKE ONE TABLET BY MOUTH AT BEDTIME. 90 tablet 0  . meclizine (ANTIVERT) 12.5 MG tablet Take 1 tablet (12.5 mg total) by mouth 2 (two) times daily as needed for dizziness. 30 tablet 0  . metoprolol tartrate (LOPRESSOR) 100 MG tablet TAKE 1 TABLET BY MOUTH TWICE A DAY. 180 tablet 0  . Multiple Vitamins-Minerals (EYE VITAMINS & MINERALS PO) Take 1 tablet by mouth daily.    . nitrofurantoin (MACRODANTIN) 50 MG capsule Take 1 capsule (50 mg total) by mouth at bedtime. 30 capsule 11  .  omeprazole (PRILOSEC) 40 MG capsule TAKE (1) CAPSULE BY MOUTH ONCE DAILY FOR ACID REFLUX. 30 capsule 4  . OXYGEN-HELIUM IN Inhale 2 L into the lungs at bedtime.    . Potassium Gluconate 2.5 MEQ TABS Take by mouth.    . warfarin (COUMADIN) 5 MG tablet TAKE 1/2 TABLET BY MOUTH DAILY EXCEPT 1 TABLET ON WEDNESDAYS & SATURDAYS OR AS DIRECTED. 25 tablet  6  . furosemide (LASIX) 80 MG tablet Take 1 tablet (80 mg total) by mouth daily. 90 tablet 3  . nitrofurantoin, macrocrystal-monohydrate, (MACROBID) 100 MG capsule Take 1 capsule (100 mg total) by mouth 2 (two) times daily. 14 capsule 0   No facility-administered medications prior to visit.    No Known Allergies  Review of Systems  Constitutional: Negative for fatigue and fever.  Gastrointestinal: Negative for abdominal pain, diarrhea and nausea.  Genitourinary: Positive for dysuria, frequency and urgency. Negative for hematuria and pelvic pain.       Objective:    Physical Exam Constitutional:      Appearance: Normal appearance.  Cardiovascular:     Rate and Rhythm: Normal rate and regular rhythm.     Pulses: Normal pulses.     Heart sounds: Normal heart sounds.  Pulmonary:     Effort: Pulmonary effort is normal.     Breath sounds: Normal breath sounds.  Abdominal:     General: There is no distension.     Tenderness: There is no abdominal tenderness. There is right CVA tenderness. There is no left CVA tenderness.  Neurological:     Mental Status: She is alert.     BP 111/70 (BP Location: Right Arm, Patient Position: Sitting, Cuff Size: Large)   Pulse 86   Temp 97.8 F (36.6 C) (Temporal)   Ht 5\' 6"  (1.676 m)   Wt 163 lb 12.8 oz (74.3 kg)   SpO2 96%   BMI 26.44 kg/m  Wt Readings from Last 3 Encounters:  12/15/19 163 lb 12.8 oz (74.3 kg)  11/13/19 161 lb 3.2 oz (73.1 kg)  11/11/19 158 lb (71.7 kg)    Health Maintenance Due  Topic Date Due  . COVID-19 Vaccine (1) Never done     Lab Results  Component Value Date    TSH 2.88 11/04/2019   Lab Results  Component Value Date   WBC 7.0 11/04/2019   HGB 10.7 (L) 11/04/2019   HCT 31.7 (L) 11/04/2019   MCV 89.0 11/04/2019   PLT 290 11/04/2019   Lab Results  Component Value Date   NA 137 12/12/2019   K 3.8 12/12/2019   CO2 29 12/12/2019   GLUCOSE 83 12/12/2019   BUN 29 (H) 12/12/2019   CREATININE 1.30 (H) 12/12/2019   BILITOT 0.5 11/04/2019   ALKPHOS 73 11/22/2016   AST 14 11/04/2019   ALT 9 11/04/2019   PROT 6.9 11/04/2019   ALBUMIN 3.7 11/22/2016   CALCIUM 9.7 12/12/2019   ANIONGAP 8 07/08/2019   Lab Results  Component Value Date   CHOL 132 11/04/2019   Lab Results  Component Value Date   HDL 63 11/04/2019   Lab Results  Component Value Date   LDLCALC 50 11/04/2019   Lab Results  Component Value Date   TRIG 105 11/04/2019   Lab Results  Component Value Date   CHOLHDL 2.1 11/04/2019   Lab Results  Component Value Date   HGBA1C 5.6 11/22/2016       Assessment & Plan:  1. Recurrent UTI (urinary tract infection) ampicillin-rx-drink plenty of water Culture pending-will call with results-reviewed labwork Hayley Schwan Hannah Beat, MD

## 2019-12-16 LAB — URINALYSIS W MICROSCOPIC + REFLEX CULTURE
Bilirubin Urine: NEGATIVE
Glucose, UA: NEGATIVE
Hyaline Cast: NONE SEEN /LPF
Ketones, ur: NEGATIVE
Nitrites, Initial: NEGATIVE
Protein, ur: NEGATIVE
Specific Gravity, Urine: 1.012 (ref 1.001–1.03)
Squamous Epithelial / HPF: NONE SEEN /HPF (ref <=5)
pH: 5.5 (ref 5.0–8.0)

## 2019-12-16 LAB — URINE CULTURE
MICRO NUMBER:: 10373044
SPECIMEN QUALITY:: ADEQUATE

## 2019-12-16 LAB — CULTURE INDICATED

## 2019-12-17 ENCOUNTER — Telehealth: Payer: Self-pay | Admitting: Cardiology

## 2019-12-17 NOTE — Telephone Encounter (Signed)
Asking for test results  Asking about medication for memory .... hearing music and people singing to her

## 2019-12-17 NOTE — Telephone Encounter (Signed)
Returned pt call. Daughter requests lab results. She also wanted to inform Dr. Harl Bowie pt is progressing as far as her dementia. I asked her to call pcp to inform them.

## 2019-12-18 ENCOUNTER — Telehealth: Payer: Self-pay

## 2019-12-18 NOTE — Telephone Encounter (Signed)
Just resulted   J Branch MD 

## 2019-12-18 NOTE — Telephone Encounter (Signed)
Left message to call back-cc 

## 2019-12-18 NOTE — Telephone Encounter (Signed)
See phone note from today (4/21). I left message for daughte to call back.

## 2019-12-18 NOTE — Telephone Encounter (Signed)
-----   Message from Arnoldo Lenis, MD sent at 12/18/2019 11:58 AM EDT ----- Mild stress on the kidneys, verify taking lasix 80mg  and if so would change to 80mg  alternating days with 60mg . Repeat BMET/Mg in 3 weeks  Zandra Abts MD

## 2019-12-19 ENCOUNTER — Other Ambulatory Visit: Payer: Self-pay | Admitting: Family Medicine

## 2019-12-19 ENCOUNTER — Telehealth: Payer: Self-pay | Admitting: *Deleted

## 2019-12-19 ENCOUNTER — Telehealth: Payer: Self-pay

## 2019-12-19 DIAGNOSIS — Z79899 Other long term (current) drug therapy: Secondary | ICD-10-CM

## 2019-12-19 MED ORDER — FUROSEMIDE 40 MG PO TABS: ORAL_TABLET | ORAL | 3 refills | Status: DC

## 2019-12-19 NOTE — Telephone Encounter (Signed)
-----   Message from Arnoldo Lenis, MD sent at 12/18/2019 11:58 AM EDT ----- Mild stress on the kidneys, verify taking lasix 80mg  and if so would change to 80mg  alternating days with 60mg . Repeat BMET/Mg in 3 weeks  Zandra Abts MD

## 2019-12-19 NOTE — Telephone Encounter (Signed)
Daughter Mickel Baas returning Cathy,RN's call      Thanks renee

## 2019-12-24 ENCOUNTER — Other Ambulatory Visit: Payer: Self-pay | Admitting: Family Medicine

## 2019-12-26 ENCOUNTER — Ambulatory Visit
Admission: EM | Admit: 2019-12-26 | Discharge: 2019-12-26 | Disposition: A | Discharge status: Home / Self Care | Payer: Medicare Other | Attending: Emergency Medicine | Admitting: Emergency Medicine

## 2019-12-26 DIAGNOSIS — J069 Acute upper respiratory infection, unspecified: Secondary | ICD-10-CM

## 2019-12-26 DIAGNOSIS — Z20822 Contact with and (suspected) exposure to covid-19: Secondary | ICD-10-CM | POA: Diagnosis not present

## 2019-12-26 NOTE — Discharge Instructions (Signed)
COVID testing ordered.  It will take between 2-5 days for test results.  Someone will contact you regarding abnormal results.    In the meantime: You should remain isolated in your home for 10 days from symptom onset AND greater than 72 hours after symptoms resolution (absence of fever without the use of fever-reducing medication and improvement in respiratory symptoms), whichever is longer Get plenty of rest and push fluids Use OTC zyrtec for nasal congestion, runny nose, and/or sore throat Use OTC flonase for nasal congestion and runny nose Use medications daily for symptom relief Use OTC medications like ibuprofen or tylenol as needed fever or pain Call or go to the ED if you have any new or worsening symptoms such as fever, worsening cough, shortness of breath, chest tightness, chest pain, turning blue, changes in mental status, etc..Marland Kitchen

## 2019-12-26 NOTE — ED Triage Notes (Signed)
Pt presents to get tested for COVID testing after being exposed to a family member that is positive this week. Reports cough and sinuses x 1 week.

## 2019-12-26 NOTE — ED Provider Notes (Signed)
[facility]   [id number] 12/26/19 Arrival Time: 1228   CC: COVID symptoms  SUBJECTIVE: History from: patient.  [name] is a 86 y.o. female who presents with sinus congestion and cough x 1 week .  Denies sick exposure to COVID, flu or strep.  Denies recent travel.  Has tried OTC medications with relief.  Denies previous COVID infection in the past.   Denies fever, chills, SOB, wheezing, chest pain, nausea, changes in bowel or bladder habits.    ROS: As per HPI.  All other pertinent ROS negative.     Past Medical History:  Diagnosis Date  . Annual physical exam 04/22/2015  . Anxiety   . Arthritis   . Arthritis   . Asthma   . Atrial fibrillation ([place])   . Bleeding disorder ([place])   . Gastroesophageal reflux disease   . Hearing impairment   . Heart disease   . Heart murmur   . High cholesterol   . Hyperlipidemia   . Hypertension   . Hypothyroidism   . Impaired glucose tolerance   . Macular degeneration   . Pancreatitis   . Paroxysmal atrial fibrillation ([place]) 2011  . Sick sinus syndrome ([place]) 2011   Atrial fibrilation 10/2009; and dual chamber pacemaker in 4/11; normal EF  . Sleep apnea    Nocturnal oxygen therapy  . Zoster 2016   Past Surgical History:  Procedure Laterality Date  . ABDOMINAL HYSTERECTOMY    . CATARACT EXTRACTION     Bilateral  . COLONOSCOPY  2009   Dr. Gala Romney  . EYE SURGERY     laser   . PACEMAKER INSERTION  2011   dual chamber   . PPM GENERATOR CHANGEOUT N/A 11/13/2016   Procedure: PPM Generator Changeout;  Surgeon: Evans Lance, MD;  Location: [facility];  Service: Cardiovascular;  Laterality: N/A;  . SALPINGOOPHORECTOMY  1970   right   No Known Allergies No current facility-administered medications on file prior to encounter.   Current Outpatient Medications on File Prior to Encounter  Medication Sig Dispense Refill  . ampicillin (PRINCIPEN) 500 MG capsule Take 1 capsule (500 mg total) by mouth 3 (three) times  daily. 15 capsule 0  . busPIRone (BUSPAR) 5 MG tablet TAKE 1 TABLET BY MOUTH TWICE DAILY 60 tablet 5  . Calcium Carb-Cholecalciferol 500-400 MG-UNIT CHEW Chew 1 tablet by mouth daily.  60 tablet   . diltiazem (CARDIZEM CD) 240 MG 24 hr capsule Take 1 capsule (240 mg total) by mouth daily. 30 capsule 11  . donepezil (ARICEPT) 5 MG tablet TAKE (1) TABLET BY MOUTH AT BEDTIME. 30 tablet 3  . fluticasone (FLONASE) 50 MCG/ACT nasal spray Place 1 spray into both nostrils daily.    . furosemide (LASIX) 40 MG tablet Take 80 mg (2 Tablets)  Alternating days with 60 mg(1 1/2 Tablets) Daily 180 tablet 3  . levothyroxine (SYNTHROID) 112 MCG tablet TAKE 1 TABLET BY MOUTH ONCE DAILY. (Patient taking differently: Take 112 mcg by mouth daily before breakfast. ) 90 tablet 1  . loratadine (CLARITIN) 10 MG tablet Take 1 tablet (10 mg total) by mouth daily. 30 tablet 1  . lovastatin (MEVACOR) 40 MG tablet TAKE ONE TABLET BY MOUTH AT BEDTIME. 90 tablet 0  . meclizine (ANTIVERT) 12.5 MG tablet Take 1 tablet (12.5 mg total) by mouth 2 (two) times daily as needed for dizziness. 30 tablet 0  . metoprolol tartrate (LOPRESSOR) 100 MG tablet TAKE 1 TABLET BY MOUTH TWICE A  DAY. 180 tablet 0  . Multiple Vitamins-Minerals (EYE VITAMINS & MINERALS PO) Take 1 tablet by mouth daily.    . nitrofurantoin (MACRODANTIN) 50 MG capsule Take 1 capsule (50 mg total) by mouth at bedtime. 30 capsule 11  . omeprazole (PRILOSEC) 40 MG capsule TAKE (1) CAPSULE BY MOUTH ONCE DAILY FOR ACID REFLUX. 30 capsule 5  . OXYGEN-HELIUM IN Inhale 2 L into the lungs at bedtime.    . Potassium Gluconate 2.5 MEQ TABS Take by mouth.    . warfarin (COUMADIN) 5 MG tablet TAKE 1/2 TABLET BY MOUTH DAILY EXCEPT 1 TABLET ON WEDNESDAYS & SATURDAYS OR AS DIRECTED. 25 tablet 6  . [DISCONTINUED] benzonatate (TESSALON PERLES) 100 MG capsule Take 1 capsule (100 mg total) by mouth every 6 (six) hours as needed for cough. 30 capsule 0   Social History   Socioeconomic  History  . Marital status: Widowed    Spouse name: Not on file  . Number of children: 7  . Years of education: college  . Highest education level: Not on file  Occupational History  . Occupation: retired     Fish farm manager: RETIRED  Tobacco Use  . Smoking status: Never Smoker  . Smokeless tobacco: Never Used  Substance and Sexual Activity  . Alcohol use: No    Alcohol/week: 0.0 standard drinks  . Drug use: No  . Sexual activity: Not Currently  Other Topics Concern  . Not on file  Social History Narrative  . Not on file   Social Determinants of Health   Financial Resource Strain:   . Difficulty of Paying Living Expenses:   Food Insecurity:   . Worried About Charity fundraiser in the Last Year:   . Arboriculturist in the Last Year:   Transportation Needs:   . Film/video editor (Medical):   Marland Kitchen Lack of Transportation (Non-Medical):   Physical Activity:   . Days of Exercise per Week:   . Minutes of Exercise per Session:   Stress:   . Feeling of Stress :   Social Connections:   . Frequency of Communication with Friends and Family:   . Frequency of Social Gatherings with Friends and Family:   . Attends Religious Services:   . Active Member of Clubs or Organizations:   . Attends Archivist Meetings:   Marland Kitchen Marital Status:   Intimate Partner Violence:   . Fear of Current or Ex-Partner:   . Emotionally Abused:   Marland Kitchen Physically Abused:   . Sexually Abused:    Family History  Problem Relation Age of Onset  . Cancer Mother        gential  . Cancer Father        throat   . Pneumonia Sister   . Emphysema Sister   . Mitral valve prolapse Sister   . Heart disease Other   . Arthritis Other   . Lung disease Other   . Asthma Other   . Melanoma Son     OBJECTIVE:  Vitals:   12/26/19 1259  BP: (!) 151/78  Pulse: 89  Resp: 16  Temp: 97.9 F (36.6 C)  TempSrc: Tympanic  SpO2: 97%     General appearance: alert; appears fatigued, but nontoxic; speaking in full  sentences and tolerating own secretions HEENT: NCAT; Ears: EACs clear, TMs pearly gray; Eyes: PERRL.  EOM grossly intact. Sinuses: nontender; Nose: nares patent without rhinorrhea, Throat: oropharynx clear, tonsils non erythematous or enlarged, uvula midline  Neck: supple without  LAD Lungs: unlabored respirations, symmetrical air entry; cough: absent; no respiratory distress; CTAB Heart: regular rate and rhythm.   Skin: warm and dry Psychological: alert and cooperative; normal mood and affect   ASSESSMENT & PLAN:  1. Exposure to COVID-19 virus   2. Viral URI with cough    COVID testing ordered.  It will take between 2-5 days for test results.  Someone will contact you regarding abnormal results.    In the meantime: You should remain isolated in your home for 10 days from symptom onset AND greater than 72 hours after symptoms resolution (absence of fever without the use of fever-reducing medication and improvement in respiratory symptoms), whichever is longer Get plenty of rest and push fluids Use OTC zyrtec for nasal congestion, runny nose, and/or sore throat Use OTC flonase for nasal congestion and runny nose Use medications daily for symptom relief Use OTC medications like ibuprofen or tylenol as needed fever or pain Call or go to the ED if you have any new or worsening symptoms such as fever, worsening cough, shortness of breath, chest tightness, chest pain, turning blue, changes in mental status, etc...   Reviewed expectations re: course of current medical issues. Questions answered. Outlined signs and symptoms indicating need for more acute intervention. Patient verbalized understanding. After Visit Summary given.         Lestine Box, PA-C 12/26/19 [phone]

## 2019-12-27 LAB — NOVEL CORONAVIRUS, NAA: SARS-CoV-2, NAA: NOT DETECTED

## 2019-12-31 ENCOUNTER — Ambulatory Visit: Payer: Medicare Other | Admitting: Urology

## 2019-12-31 ENCOUNTER — Other Ambulatory Visit: Payer: Self-pay | Admitting: Family Medicine

## 2020-01-01 ENCOUNTER — Other Ambulatory Visit: Payer: Self-pay | Admitting: Family Medicine

## 2020-01-01 DIAGNOSIS — R0902 Hypoxemia: Secondary | ICD-10-CM | POA: Diagnosis not present

## 2020-01-01 NOTE — Assessment & Plan Note (Signed)
Continued need for supplemental oxygen

## 2020-01-15 ENCOUNTER — Other Ambulatory Visit: Payer: Self-pay | Admitting: Cardiology

## 2020-01-20 ENCOUNTER — Other Ambulatory Visit: Payer: Self-pay | Admitting: *Deleted

## 2020-01-20 ENCOUNTER — Telehealth: Payer: Self-pay

## 2020-01-20 ENCOUNTER — Ambulatory Visit (INDEPENDENT_AMBULATORY_CARE_PROVIDER_SITE_OTHER): Payer: Medicare Other | Admitting: *Deleted

## 2020-01-20 ENCOUNTER — Other Ambulatory Visit: Payer: Self-pay

## 2020-01-20 DIAGNOSIS — N3000 Acute cystitis without hematuria: Secondary | ICD-10-CM | POA: Diagnosis not present

## 2020-01-20 DIAGNOSIS — Z5181 Encounter for therapeutic drug level monitoring: Secondary | ICD-10-CM | POA: Diagnosis not present

## 2020-01-20 DIAGNOSIS — I480 Paroxysmal atrial fibrillation: Secondary | ICD-10-CM

## 2020-01-20 DIAGNOSIS — Z79899 Other long term (current) drug therapy: Secondary | ICD-10-CM | POA: Diagnosis not present

## 2020-01-20 LAB — POCT INR: INR: 3 (ref 2.0–3.0)

## 2020-01-20 NOTE — Patient Instructions (Signed)
Continue warfarin 1/2 tablet daily except 1 tablet on Wednesdays and Saturdays.  Recheck in 6 weeks Keep intake of greens consistent

## 2020-01-20 NOTE — Telephone Encounter (Signed)
Would you like to order urine culture?

## 2020-01-20 NOTE — Telephone Encounter (Signed)
Yes, please do.

## 2020-01-20 NOTE — Telephone Encounter (Signed)
Pts Daughter is calling -states she wants PTs urine checked to see if there is any growth. She states pt has little burning, but no other symptoms, and she would like it checked to be sure nothing is going on   The pt will go to the lab around 11:30 today

## 2020-01-20 NOTE — Telephone Encounter (Signed)
Urine culture ordered and sent to [facility]

## 2020-01-20 NOTE — Telephone Encounter (Signed)
Would you like to order urine culture

## 2020-01-21 ENCOUNTER — Ambulatory Visit: Payer: Medicare Other | Admitting: Pulmonary Disease

## 2020-01-21 ENCOUNTER — Encounter: Payer: Self-pay | Admitting: Pulmonary Disease

## 2020-01-21 DIAGNOSIS — G4734 Idiopathic sleep related nonobstructive alveolar hypoventilation: Secondary | ICD-10-CM | POA: Diagnosis not present

## 2020-01-21 DIAGNOSIS — J302 Other seasonal allergic rhinitis: Secondary | ICD-10-CM | POA: Diagnosis not present

## 2020-01-21 LAB — BASIC METABOLIC PANEL
BUN/Creatinine Ratio: 17 (calc) (ref 6–22)
BUN: 21 mg/dL (ref 7–25)
CO2: 30 mmol/L (ref 20–32)
Calcium: 9.9 mg/dL (ref 8.6–10.4)
Chloride: 99 mmol/L (ref 98–110)
Creat: 1.27 mg/dL — ABNORMAL HIGH (ref 0.60–0.88)
Glucose, Bld: 98 mg/dL (ref 65–139)
Potassium: 4.7 mmol/L (ref 3.5–5.3)
Sodium: 137 mmol/L (ref 135–146)

## 2020-01-21 LAB — MAGNESIUM: Magnesium: 2.2 mg/dL (ref 1.5–2.5)

## 2020-01-21 NOTE — Assessment & Plan Note (Addendum)
I doubt that she has significant COPD.  She is a never smoker.  She is unable to perform PFTs for [place] to assess any further. She does have OSA but has been intolerant of CPAP and does not want to trial again.  She has been maintained on oxygen for many years during sleep and will continue this.  I do not feel like she needs oxygen in the daytime.  I strongly recommended Covid vaccination but she is hesitant

## 2020-01-21 NOTE — Assessment & Plan Note (Signed)
She has a chronic dry cough that has been attributed to seasonal allergies.  She is on Flonase and Claritin and we can continue this. Prior chest x-ray personally reviewed shows mild pulmonary vascular congestion, I do not hear the dry rales of pulmonary fibrosis but this remains in the differential diagnosis.  At any rate we will not pursue further testing for this.  I note that she is maintained on Macrobid and E. coli in her urine is resistant to this anyway so I feel she should stop

## 2020-01-21 NOTE — Progress Notes (Signed)
Subjective:    Patient ID: [name], female    DOB: 11/22/1925, 86 y.o.   MRN: [id number]  HPI  86 year old never smoker referred for evaluation of 'COPD & OSA ' - previous pt of dr Luan Pulling  She has been maintained on nocturnal oxygen Since 2011.  She was told that she has COPD.  Lung function testing was never performed.  She was since diagnosed with cardiac issues and after placement of PPM in 2011, her breathing issues subsided  She was diagnosed with OSA and was provided a CPAP machine which she could not use partly because of her blindness and her hose would fill up and water.  She stopped using her CPAP machine and was maintained on oxygen for OSA.  She has chronic UTIs and is on low-dose Macrobid-more recent urine cultures show E. coli resistant to Macrobid.  She reports chronic dry cough and dyspnea on exertion.  She has started on Claritin and Flonase for seasonal allergies.  She ambulates with a walker and arrives today accompanied by her daughter [name] who is also to establish as a new patient with [place] for the same issues  PMH -paroxysmal atrial fibrillation, status post PPM for bradycardia  Cardiology consultation 10/2019 was reviewed Chest x-ray 08/2018 was personally reviewed which shows cardiomegaly and pulmonary vascular congestion  She has refused Covid vaccination. Daughter reports memory deficits  Past Medical History:  Diagnosis Date  . Annual physical exam 04/22/2015  . Anxiety   . Arthritis   . Arthritis   . Asthma   . Atrial fibrillation ([place])   . Bleeding disorder ([place])   . Gastroesophageal reflux disease   . Hearing impairment   . Heart disease   . Heart murmur   . High cholesterol   . Hyperlipidemia   . Hypertension   . Hypothyroidism   . Impaired glucose tolerance   . Macular degeneration   . Pancreatitis   . Paroxysmal atrial fibrillation ([place]) 2011  . Sick sinus syndrome ([place]) 2011   Atrial fibrilation 10/2009; and dual chamber pacemaker  in 4/11; normal EF  . Sleep apnea    Nocturnal oxygen therapy  . Zoster 2016    Past Surgical History:  Procedure Laterality Date  . ABDOMINAL HYSTERECTOMY    . CATARACT EXTRACTION     Bilateral  . COLONOSCOPY  2009   Dr. Gala Romney  . EYE SURGERY     laser   . PACEMAKER INSERTION  2011   dual chamber   . PPM GENERATOR CHANGEOUT N/A 11/13/2016   Procedure: PPM Generator Changeout;  Surgeon: Evans Lance, MD;  Location: [facility];  Service: Cardiovascular;  Laterality: N/A;  . SALPINGOOPHORECTOMY  1970   right    No Known Allergies  Social History   Socioeconomic History  . Marital status: Widowed    Spouse name: Not on file  . Number of children: 7  . Years of education: college  . Highest education level: Not on file  Occupational History  . Occupation: retired     Fish farm manager: RETIRED  Tobacco Use  . Smoking status: Never Smoker  . Smokeless tobacco: Never Used  Substance and Sexual Activity  . Alcohol use: No    Alcohol/week: 0.0 standard drinks  . Drug use: No  . Sexual activity: Not Currently  Other Topics Concern  . Not on file  Social History Narrative  . Not on file   Social Determinants of Health   Financial Resource Strain:   .  Difficulty of Paying Living Expenses:   Food Insecurity:   . Worried About Charity fundraiser in the Last Year:   . Arboriculturist in the Last Year:   Transportation Needs:   . Film/video editor (Medical):   Marland Kitchen Lack of Transportation (Non-Medical):   Physical Activity:   . Days of Exercise per Week:   . Minutes of Exercise per Session:   Stress:   . Feeling of Stress :   Social Connections:   . Frequency of Communication with Friends and Family:   . Frequency of Social Gatherings with Friends and Family:   . Attends Religious Services:   . Active Member of Clubs or Organizations:   . Attends Archivist Meetings:   Marland Kitchen Marital Status:   Intimate Partner Violence:   . Fear of Current or  Ex-Partner:   . Emotionally Abused:   Marland Kitchen Physically Abused:   . Sexually Abused:     Family History  Problem Relation Age of Onset  . Cancer Mother        gential  . Cancer Father        throat   . Pneumonia Sister   . Emphysema Sister   . Mitral valve prolapse Sister   . Heart disease Other   . Arthritis Other   . Lung disease Other   . Asthma Other   . Melanoma Son       Review of Systems     Objective:   Physical Exam  Gen. Pleasant, elderly, in no distress, normal affect ENT - no pallor,icterus, no post nasal drip, very hard of hearing Neck: No JVD, no thyromegaly, no carotid bruits Lungs: no use of accessory muscles, no dullness to percussion, clear without rales or rhonchi  Cardiovascular: Rhythm regular, heart sounds  normal, no murmurs or gallops, no peripheral edema Abdomen: soft and non-tender, no hepatosplenomegaly, BS normal. Musculoskeletal:  no cyanosis or clubbing Neuro:  alert, non focal       Assessment & Plan:

## 2020-01-21 NOTE — Patient Instructions (Signed)
Continue using oxygen during sleep OK to STOP taking macrobid - confirm with PCP

## 2020-01-22 ENCOUNTER — Telehealth: Payer: Self-pay | Admitting: *Deleted

## 2020-01-22 NOTE — Telephone Encounter (Signed)
Pt daughter requesting lab results

## 2020-01-23 ENCOUNTER — Other Ambulatory Visit: Payer: Self-pay | Admitting: Family Medicine

## 2020-01-23 DIAGNOSIS — N3000 Acute cystitis without hematuria: Secondary | ICD-10-CM

## 2020-01-23 LAB — URINE CULTURE
MICRO NUMBER:: 10518133
SPECIMEN QUALITY:: ADEQUATE

## 2020-01-23 MED ORDER — AMOXICILLIN-POT CLAVULANATE 500-125 MG PO TABS: 1.0000 | ORAL_TABLET | Freq: Two times a day (BID) | ORAL | 0 refills | Status: AC

## 2020-01-23 NOTE — Telephone Encounter (Signed)
Macrobid is an antibiotic, I would not have commented on an antibiotic, perhaps it was another doctor of hers.    Zandra Abts MD

## 2020-01-23 NOTE — Telephone Encounter (Signed)
Pt daughter voiced understanding

## 2020-01-23 NOTE — Telephone Encounter (Signed)
Pt daughter voiced understanding - wanted to know if she needed to stop Microbid says Dr Harl Bowie had told her to hold this previously due to kidney funtcion

## 2020-01-24 ENCOUNTER — Other Ambulatory Visit: Payer: Self-pay | Admitting: Family Medicine

## 2020-01-24 MED ORDER — DONEPEZIL HCL 5 MG PO TABS: ORAL_TABLET | ORAL | 1 refills | Status: DC

## 2020-01-29 ENCOUNTER — Ambulatory Visit (INDEPENDENT_AMBULATORY_CARE_PROVIDER_SITE_OTHER): Payer: Medicare Other | Admitting: *Deleted

## 2020-01-29 ENCOUNTER — Telehealth: Payer: Self-pay

## 2020-01-29 DIAGNOSIS — I495 Sick sinus syndrome: Secondary | ICD-10-CM | POA: Diagnosis not present

## 2020-01-29 LAB — CUP PACEART REMOTE DEVICE CHECK
Battery Remaining Longevity: 118 mo
Battery Remaining Percentage: 95.5 %
Battery Voltage: 2.99 V
Brady Statistic RV Percent Paced: 76 %
Date Time Interrogation Session: 20210603121309
Implantable Lead Implant Date: 20110422
Implantable Lead Implant Date: 20110422
Implantable Lead Location: 753859
Implantable Lead Location: 753860
Implantable Pulse Generator Implant Date: 20180319
Lead Channel Impedance Value: 440 Ohm
Lead Channel Pacing Threshold Amplitude: 1 V
Lead Channel Pacing Threshold Pulse Width: 0.5 ms
Lead Channel Sensing Intrinsic Amplitude: 7 mV
Lead Channel Setting Pacing Amplitude: 2 V
Lead Channel Setting Pacing Pulse Width: 0.5 ms
Lead Channel Setting Sensing Sensitivity: 1 mV
Pulse Gen Model: 2272
Pulse Gen Serial Number: 8010902

## 2020-01-29 NOTE — Telephone Encounter (Signed)
Pts daughter is  Calling states that pt has a yeast infection ==can something be called [phone]

## 2020-01-29 NOTE — Telephone Encounter (Signed)
Pls document symptoms reported and duration , so I can send in medication if indicated

## 2020-01-30 ENCOUNTER — Other Ambulatory Visit: Payer: Self-pay | Admitting: Family Medicine

## 2020-01-30 MED ORDER — FLUCONAZOLE 150 MG PO TABS: ORAL_TABLET | ORAL | 0 refills | Status: DC

## 2020-01-30 NOTE — Telephone Encounter (Signed)
Had a recent UTI (Ecoli), is taking Augmentin for UTI. pt is seeing white discharge, has had for about 2 months. Burning and itching. Would like something for the yeast infection

## 2020-01-30 NOTE — Telephone Encounter (Signed)
Daughter aware of new prescription and for patient not to take Lovastatin for the next 4 days.

## 2020-01-30 NOTE — Telephone Encounter (Signed)
Please advise fluconazole prescribed for 2 days only, do not take her cholesterol tab lovastatin for those 2 days and an additonal 2 days (4 total)due to drug interaction

## 2020-01-30 NOTE — Progress Notes (Signed)
Fluconazole 150  

## 2020-02-01 DIAGNOSIS — R0902 Hypoxemia: Secondary | ICD-10-CM | POA: Diagnosis not present

## 2020-02-03 NOTE — Progress Notes (Signed)
Remote pacemaker transmission.   

## 2020-02-09 ENCOUNTER — Ambulatory Visit: Payer: Medicare Other | Admitting: Obstetrics and Gynecology

## 2020-02-09 ENCOUNTER — Encounter: Payer: Self-pay | Admitting: Obstetrics and Gynecology

## 2020-02-09 VITALS — BP 108/59 | HR 72 | Ht 66.0 in | Wt 163.0 lb

## 2020-02-09 DIAGNOSIS — B9689 Other specified bacterial agents as the cause of diseases classified elsewhere: Secondary | ICD-10-CM | POA: Diagnosis not present

## 2020-02-09 DIAGNOSIS — N390 Urinary tract infection, site not specified: Secondary | ICD-10-CM | POA: Diagnosis not present

## 2020-02-09 LAB — POCT URINALYSIS DIPSTICK OB
Blood, UA: NEGATIVE
Glucose, UA: NEGATIVE
Ketones, UA: NEGATIVE
Nitrite, UA: NEGATIVE
POC,PROTEIN,UA: NEGATIVE

## 2020-02-09 MED ORDER — CEPHALEXIN 500 MG PO CAPS: 500.0000 mg | ORAL_CAPSULE | Freq: Four times a day (QID) | ORAL | 0 refills | Status: DC

## 2020-02-09 MED ORDER — MICONAZOLE NITRATE 2 % VA CREA: 1.0000 | TOPICAL_CREAM | Freq: Every day | VAGINAL | Status: DC

## 2020-02-09 NOTE — Addendum Note (Signed)
Addended by: Janece Canterbury on: 02/09/2020 05:09 PM   Modules accepted: Orders

## 2020-02-09 NOTE — Progress Notes (Signed)
Patient ID: [name], female   DOB: [birth year], 86 y.o.   MRN: [id number]    [facility] Visit  @DATE @            Patient name: [name] MRN [id number]  Date of birth: [birth year]  CC & HPI:  [name] is a 86 y.o. female NEW PT presenting today for a possible UTI. She has had off and on bladder infections since October. Right now, she experiences burning when she pees. She tried Augmentin and Diflucan but they did not help. She also tried Macrobid at night, which did not work. The patient is not allergic to any antibiotics.   The patient denies fever, chills or any other symptoms or complaints at this time. She is accompanied by her daughter today.  ROS:  ROS  + burning with urination - fever - chills All systems are negative except as noted in the HPI and PMH.   Pertinent History Reviewed:   Reviewed: Medical         Past Medical History:  Diagnosis Date  . Annual physical exam 04/22/2015  . Anxiety   . Arthritis   . Arthritis   . Asthma   . Atrial fibrillation ([place])   . Bleeding disorder ([place])   . Gastroesophageal reflux disease   . Hearing impairment   . Heart disease   . Heart murmur   . High cholesterol   . Hyperlipidemia   . Hypertension   . Hypothyroidism   . Impaired glucose tolerance   . Macular degeneration   . Pancreatitis   . Paroxysmal atrial fibrillation ([place]) 2011  . Sick sinus syndrome ([place]) 2011   Atrial fibrilation 10/2009; and dual chamber pacemaker in 4/11; normal EF  . Sleep apnea    Nocturnal oxygen therapy  . Zoster 2016                              Surgical Hx:    Past Surgical History:  Procedure Laterality Date  . ABDOMINAL HYSTERECTOMY    . CATARACT EXTRACTION     Bilateral  . COLONOSCOPY  2009   Dr. Gala Romney  . EYE SURGERY     laser   . PACEMAKER INSERTION  2011   dual chamber   . PPM GENERATOR CHANGEOUT N/A 11/13/2016   Procedure: PPM Generator Changeout;  Surgeon: Evans Lance, MD;  Location: [facility];  Service: Cardiovascular;  Laterality: N/A;  . SALPINGOOPHORECTOMY  1970   right   Medications: Reviewed & Updated - see associated section                       Current Outpatient Medications:  .  busPIRone (BUSPAR) 5 MG tablet, TAKE 1 TABLET BY MOUTH TWICE DAILY, Disp: 60 tablet, Rfl: 5 .  Calcium Carb-Cholecalciferol 500-400 MG-UNIT CHEW, Chew 1 tablet by mouth daily. , Disp: 60 tablet, Rfl:  .  diltiazem (CARDIZEM CD) 240 MG 24 hr capsule, Take 1 capsule (240 mg total) by mouth daily., Disp: 30 capsule, Rfl: 11 .  donepezil (ARICEPT) 5 MG tablet, TAKE (1) TABLET BY MOUTH AT BEDTIME., Disp: 90 tablet, Rfl: 1 .  fluticasone (FLONASE) 50 MCG/ACT nasal spray, Place 1 spray into both nostrils daily., Disp: , Rfl:  .  furosemide (LASIX) 40 MG tablet, Take 80 mg (2 Tablets)  Alternating days with 60 mg(1 1/2 Tablets) Daily, Disp: 180  tablet, Rfl: 3 .  levothyroxine (SYNTHROID) 112 MCG tablet, TAKE 1 TABLET BY MOUTH ONCE DAILY., Disp: 90 tablet, Rfl: 0 .  loratadine (CLARITIN) 10 MG tablet, Take 1 tablet (10 mg total) by mouth daily., Disp: 30 tablet, Rfl: 1 .  lovastatin (MEVACOR) 40 MG tablet, TAKE ONE TABLET BY MOUTH AT BEDTIME., Disp: 90 tablet, Rfl: 0 .  meclizine (ANTIVERT) 12.5 MG tablet, Take 1 tablet (12.5 mg total) by mouth 2 (two) times daily as needed for dizziness., Disp: 30 tablet, Rfl: 0 .  metoprolol tartrate (LOPRESSOR) 100 MG tablet, TAKE 1 TABLET BY MOUTH TWICE A DAY., Disp: 180 tablet, Rfl: 2 .  Multiple Vitamins-Minerals (EYE VITAMINS & MINERALS PO), Take 1 tablet by mouth daily., Disp: , Rfl:  .  omeprazole (PRILOSEC) 40 MG capsule, TAKE (1) CAPSULE BY MOUTH ONCE DAILY FOR ACID REFLUX., Disp: 30 capsule, Rfl: 5 .  OXYGEN-HELIUM IN, Inhale 2 L into the lungs at bedtime., Disp: , Rfl:  .  Potassium Gluconate 2.5 MEQ TABS, Take by mouth., Disp: , Rfl:  .  warfarin (COUMADIN) 5 MG tablet, TAKE 1/2 TABLET BY MOUTH DAILY EXCEPT 1 TABLET ON WEDNESDAYS & SATURDAYS OR AS  DIRECTED., Disp: 25 tablet, Rfl: 6 .  nitrofurantoin (MACRODANTIN) 50 MG capsule, Take 1 capsule (50 mg total) by mouth at bedtime. (Patient not taking: Reported on 02/09/2020), Disp: 30 capsule, Rfl: 11  Social History: Reviewed -  reports that she has never smoked. She has never used smokeless tobacco.  Objective Findings:  Vitals: Blood pressure (!) 108/59, pulse 72, height 5\' 6"  (1.676 m), weight 163 lb (73.9 kg).  PHYSICAL EXAMINATION General appearance - alert, well appearing, and in no distress and oriented to person, place, and time Mental status - alert, oriented to person, place, and time, normal mood, behavior, speech, dress, motor activity, and thought processes, affect appropriate to mood  PELVIC DEFERRED  Assessment & Plan:   A:  1.  UTI  P:  1. Rx Keflex QID for 10 days 2. Rx Monistat 3. Follow up for POC in 3 weeks  By signing my name below, I, De Burrs, attest that this documentation has been prepared under the direction and in the presence of Jonnie Kind, MD. Electronically Signed: De Burrs, Medical Scribe. 02/09/20. 4:55 PM.  I personally performed the services described in this documentation, which was SCRIBED in my presence. The recorded information has been reviewed and considered accurate. It has been edited as necessary during review. Jonnie Kind, MD

## 2020-02-10 DIAGNOSIS — N390 Urinary tract infection, site not specified: Secondary | ICD-10-CM | POA: Diagnosis not present

## 2020-02-14 NOTE — Progress Notes (Signed)
On Keflex, final sensitivities to follow

## 2020-02-16 LAB — URINE CULTURE

## 2020-02-16 NOTE — Progress Notes (Signed)
Final culture Sensitive to all cephalosporins , including the Keflex she is on.

## 2020-02-25 ENCOUNTER — Ambulatory Visit (INDEPENDENT_AMBULATORY_CARE_PROVIDER_SITE_OTHER): Payer: Medicare Other | Admitting: Family Medicine

## 2020-02-25 ENCOUNTER — Other Ambulatory Visit: Payer: Self-pay

## 2020-02-25 ENCOUNTER — Encounter: Payer: Self-pay | Admitting: Family Medicine

## 2020-02-25 ENCOUNTER — Telehealth: Payer: Self-pay | Admitting: Cardiology

## 2020-02-25 VITALS — BP 123/77 | HR 69 | Temp 98.0°F | Resp 16 | Ht 66.0 in | Wt 164.8 lb

## 2020-02-25 DIAGNOSIS — I10 Essential (primary) hypertension: Secondary | ICD-10-CM

## 2020-02-25 DIAGNOSIS — Z9181 History of falling: Secondary | ICD-10-CM

## 2020-02-25 DIAGNOSIS — N390 Urinary tract infection, site not specified: Secondary | ICD-10-CM

## 2020-02-25 DIAGNOSIS — R319 Hematuria, unspecified: Secondary | ICD-10-CM | POA: Diagnosis not present

## 2020-02-25 DIAGNOSIS — I480 Paroxysmal atrial fibrillation: Secondary | ICD-10-CM

## 2020-02-25 DIAGNOSIS — R253 Fasciculation: Secondary | ICD-10-CM

## 2020-02-25 DIAGNOSIS — R42 Dizziness and giddiness: Secondary | ICD-10-CM

## 2020-02-25 DIAGNOSIS — E038 Other specified hypothyroidism: Secondary | ICD-10-CM

## 2020-02-25 LAB — POCT URINALYSIS DIP (CLINITEK)
Bilirubin, UA: NEGATIVE
Glucose, UA: NEGATIVE mg/dL
Ketones, POC UA: NEGATIVE mg/dL
Nitrite, UA: NEGATIVE
POC PROTEIN,UA: NEGATIVE
Spec Grav, UA: 1.015 (ref 1.010–1.025)
Urobilinogen, UA: 0.2 E.U./dL
pH, UA: 5.5 (ref 5.0–8.0)

## 2020-02-25 NOTE — Telephone Encounter (Signed)
Daughter has questionsre:Pt's BP changes and dizzy   Please call Anderson [place] [phone]    Thanks renee

## 2020-02-25 NOTE — Patient Instructions (Addendum)
F/U in July as before  Please submit urine today for CCUA and c/S, if abnormal , I willk send in 3 day antibiotic course. Keep  folllow up with Urology anmd Gynecology please  You are being referred to ENT re dizziness  You are referred to Dr Merlene Laughter re new jerking  [place] of arteries shows no significant blockage to blood flow to the brain  Continue to use walker , so that you  Reduce fall risk, we do not want you to fall!    Thanks for choosing [facility], we consider it a privelige to serve you.

## 2020-02-25 NOTE — Assessment & Plan Note (Signed)
3 day h/o vertigo resistant to antivert refer to ENT

## 2020-02-25 NOTE — Telephone Encounter (Signed)
Pt daughter states that pt has been dizzy and jerking when walking. Dr. Moshe Cipro is sending her to neurologist. She states that Meclizine is not helping so Dr. Moshe Cipro is sending her to ENT. Pt's daughter reports BP today as 109/70 HR 87. Daughter would like to know if anything else needs to be done. Please advise.

## 2020-02-26 ENCOUNTER — Telehealth: Payer: Self-pay

## 2020-02-26 ENCOUNTER — Other Ambulatory Visit (HOSPITAL_COMMUNITY)
Admission: RE | Admit: 2020-02-26 | Discharge: 2020-02-26 | Disposition: A | Discharge status: Home / Self Care | Payer: Medicare Other | Source: Other Acute Inpatient Hospital | Attending: Family Medicine | Admitting: Family Medicine

## 2020-02-26 ENCOUNTER — Telehealth: Payer: Self-pay | Admitting: Obstetrics and Gynecology

## 2020-02-26 ENCOUNTER — Telehealth: Payer: Self-pay | Admitting: Family Medicine

## 2020-02-26 DIAGNOSIS — N390 Urinary tract infection, site not specified: Secondary | ICD-10-CM | POA: Diagnosis not present

## 2020-02-26 NOTE — Telephone Encounter (Signed)
Please advise 

## 2020-02-26 NOTE — Telephone Encounter (Signed)
Daughter notified and voiced understanding

## 2020-02-26 NOTE — Telephone Encounter (Signed)
Pt daughter [name]) is asking about the Microbed Rx that was called in yesterday for her mother's UTI [name] said Moshe Cipro said it was resistant to the Microbed Rx and was just wanting clarification that this was the Rx she was supposed to have ?)

## 2020-02-26 NOTE — Telephone Encounter (Signed)
Telephoned patient's daughter. Daughter states patient was seen at PCP yesterday and diagnosed with another UTI. The medicine that gave patient is resistant to UTI. Advised patient to call PCP and have them change medication and to keep follow up appointment with Dr. Glo Herring July 14. Daughter voiced understanding.

## 2020-02-26 NOTE — Telephone Encounter (Signed)
Spoke with daughter and she is aware of Dr.Simpsons advice.

## 2020-02-26 NOTE — Telephone Encounter (Signed)
HRs and bp's are fine. Symptoms are not cardiac, I would have to defer to Dr Moshe Cipro. Referring to a neurologist would also be a good idea as she has done.   Zandra Abts MD

## 2020-02-26 NOTE — Telephone Encounter (Signed)
Pls let her know that I did review this, however, she was lust on a different type of antibiotic from Dr Glo Herring and still appears to have a UTI, also she has drug allergies listed to MANY antibiotics and there is a drug/ drug interaction with some of her medication and antibiotics, so I decided this was the  best antibiotic to start with

## 2020-02-26 NOTE — Telephone Encounter (Signed)
Patient daughter called in and have some concerns about her mother about having a uti.

## 2020-02-26 NOTE — Telephone Encounter (Signed)
Daughter called back and said she wants the referral to be sent to [facility] instead of [facility] because he was fixing to go out of town and he was booked out pretty far too.

## 2020-02-27 ENCOUNTER — Encounter: Payer: Self-pay | Admitting: Family Medicine

## 2020-02-27 DIAGNOSIS — R253 Fasciculation: Secondary | ICD-10-CM | POA: Insufficient documentation

## 2020-02-27 NOTE — Assessment & Plan Note (Signed)
New onset jerking movement of head noticed by daughter in past approximately 3 days, reports this occurs when patient experiences a sense of imbalance / vertigo . Vertigo is not responding to meclizine.  Patient reports she tends to experience it when she is turning corners when ambulating. Neurology to eval,

## 2020-02-27 NOTE — Telephone Encounter (Signed)
[name] pt daughter made aware of the referrals with verbal understanding

## 2020-02-27 NOTE — Progress Notes (Signed)
   [name]     MRN: [id number]      DOB: 1926-08-12   HPI [name] is here with a 3-day history of vertigo and lightheadedness associated with jerking head movements.  Reports vertigo is not responding to meclizine and the jerking of the head is new.  Patient concerned, here for further evaluation management.  [name] states that she experiences a vertigo mainly when she is turning a corner while walking and although she has been taking Antivert she this is not clearing her symptoms on the also she confirms that she notes it says her head jerking.  [name] also complains of pain with urination and pressure.  She has had 2 separate rounds of antibiotics in the past month for urinary tract infection.  She has upcoming appointments with both urology and gynecology as follows for the problem.  She denies fever chills or flank pain.  ROS Denies recent fever or chills. Denies sinus pressure, nasal congestion, ear pain or sore throat. Denies chest congestion, productive cough or  wheezing. Denies chest pains, palpitations and leg swelling Denies, nausea, vomiting,diarrhea or constipation.    Chronic  joint pain, swelling and limitation in mobility. Denies headaches, seizures, numbness, or tingling. Denies depression, anxiety or insomnia. Denies skin break down or rash.   PE  BP 123/77   Pulse 69   Temp 98 F (36.7 C)   Resp 16   Ht 5\' 6"  (1.676 m)   Wt 164 lb 12.8 oz (74.8 kg)   SpO2 98%   BMI 26.60 kg/m   Patient alert and oriented and in no cardiopulmonary distress.No nystagmus  HEENT: No facial asymmetry, EOMI,     Neck decreased ROM.  Chest: Clear to auscultation bilaterally.  CVS: S1, S2 systolic  murmurs, no S3.Regular rate.  ABD: Soft non tender. No renal angle tenderness  Ext: No edema  DH:[id number]  ROM spine, shoulders, hips and knees.  Skin: Intact, no ulcerations or rash noted.  Psych: Good eye contact, normal affect. Memory intact not anxious or  depressed appearing.  CNS: CN 2-12 intact, power,  normal throughout.no focal deficits noted.No head jerking noted at time of exam No cogwheeling or tremor noted at the time of exam   Assessment & Plan  Vertigo 3 day h/o vertigo resistant to antivert refer to ENT  Jerking New onset jerking movement of head noticed by daughter in past approximately 3 days, reports this occurs when patient experiences a sense of imbalance / vertigo . Vertigo is not responding to meclizine.  Patient reports she tends to experience it when she is turning corners when ambulating. Neurology to eval,  Recurrent UTI C/o mild dysuria and pressure. Has completed 2 rounds of antibiotics in past month, and has multiple drug alergies, abn CCUA, will start 5 day antibiotics, and follow c/s Has upcoming f/u with both Urology and Gyne re uTI  Essential hypertension Controlled, no change in medication   Hypothyroidism Controlled, no change in medication   At high risk for falls Home safety reviewed especially in light of current vertigo/ light headedness. No falls reported and she ambulates with a cane always  Paroxysmal atrial fibrillation ([place]) Rate controlled with pacemaker, remains on chronic anticoagulation

## 2020-02-27 NOTE — Assessment & Plan Note (Signed)
Home safety reviewed especially in light of current vertigo/ light headedness. No falls reported and she ambulates with a cane always

## 2020-02-27 NOTE — Assessment & Plan Note (Signed)
Controlled, no change in medication  

## 2020-02-27 NOTE — Assessment & Plan Note (Signed)
Rate controlled with pacemaker, remains on chronic anticoagulation

## 2020-02-27 NOTE — Telephone Encounter (Signed)
Please let her daughter  [name], know that [name]  is referred to [facility] neurology per her request.  Chart discloses please make arrange for appointment if possible.  Also for her ENT referral please asked the daughter if she specifically wants Dr. Benjamine Mola since   He has seen before or just the soonest available  ENT doctor and if that is the case then just do an open referral to the soonest available provider.  Generally Dr. Erik Obey is able to see patients on basis.  Thank you  I have entered both referrals , the appts just need to be made

## 2020-02-27 NOTE — Assessment & Plan Note (Addendum)
C/o mild dysuria and pressure. Has completed 2 rounds of antibiotics in past month, and has multiple drug alergies, abn CCUA, will start 5 day antibiotics, and follow c/s Has upcoming f/u with both Urology and Gyne re uTI

## 2020-02-28 LAB — URINE CULTURE: Culture: 60000 — AB

## 2020-03-01 ENCOUNTER — Other Ambulatory Visit: Payer: Self-pay | Admitting: Family Medicine

## 2020-03-01 MED ORDER — AMPICILLIN 500 MG PO CAPS
500.0000 mg | ORAL_CAPSULE | Freq: Three times a day (TID) | ORAL | 0 refills | Status: AC
Start: 2020-03-01 — End: 2020-03-08

## 2020-03-01 NOTE — Progress Notes (Signed)
Spoke with daughter, ampicillin prescribed for 1 week, advised her to stop the nitrofurantoin

## 2020-03-02 ENCOUNTER — Telehealth: Payer: Self-pay | Admitting: Cardiology

## 2020-03-02 DIAGNOSIS — R0902 Hypoxemia: Secondary | ICD-10-CM | POA: Diagnosis not present

## 2020-03-02 NOTE — Telephone Encounter (Signed)
Patient's daughter called, Stated the patient's weight has been up 165-167 the last couple of days. She seems to have fluid build up. She's been going to the bathroom regularly and taking her fluid meds. Her daughter wants to know what could be the cause of the fluid. Her appetite has not changed. They keep a record of her weight daily. She can be reached at [phone]

## 2020-03-03 ENCOUNTER — Telehealth: Payer: Self-pay | Admitting: Cardiology

## 2020-03-03 ENCOUNTER — Ambulatory Visit (INDEPENDENT_AMBULATORY_CARE_PROVIDER_SITE_OTHER): Payer: Medicare Other | Admitting: *Deleted

## 2020-03-03 DIAGNOSIS — I480 Paroxysmal atrial fibrillation: Secondary | ICD-10-CM | POA: Diagnosis not present

## 2020-03-03 DIAGNOSIS — Z5181 Encounter for therapeutic drug level monitoring: Secondary | ICD-10-CM

## 2020-03-03 LAB — POCT INR: INR: 2.6 (ref 2.0–3.0)

## 2020-03-03 NOTE — Telephone Encounter (Signed)
Spoke with daughter who states that pt has increased swelling to hands and ankles. Pt denies SOB, CP at this time. Pt has increased fatigue. Daughter denies take out food other than a taco salad on Monday. Weights are a follows.  7/3 162.2 lb given 60 mg Lasix 7/4 165.8 lb given 80 mg lasix 7/5 165.8 lb given 80 mg lasix 7/6 167.2 lb given 80 mg lasix  7/7 167.2 lb given 80 mg lasix  Please advise.

## 2020-03-03 NOTE — Telephone Encounter (Signed)
Take lasix 80mg  in AM and 40mg  in later afternoon/early evening today and tomorrow, update [place] again on weights Friday  J Libbi Towner MD

## 2020-03-03 NOTE — Telephone Encounter (Signed)
Pt has a lot of swelling  Please call [name] [phone]   Thanks renee

## 2020-03-03 NOTE — Patient Instructions (Signed)
Continue warfarin 1/2 tablet daily except 1 tablet on Wednesdays and Saturdays.  Recheck in 6 weeks Keep intake of greens consistent

## 2020-03-03 NOTE — Telephone Encounter (Signed)
Pt daughter [name] notified and voiced understanding.

## 2020-03-05 ENCOUNTER — Ambulatory Visit: Payer: Medicare Other | Admitting: Neurology

## 2020-03-08 ENCOUNTER — Other Ambulatory Visit: Payer: Self-pay | Admitting: Cardiology

## 2020-03-08 ENCOUNTER — Telehealth: Payer: Self-pay | Admitting: Cardiology

## 2020-03-08 DIAGNOSIS — R601 Generalized edema: Secondary | ICD-10-CM

## 2020-03-08 DIAGNOSIS — I10 Essential (primary) hypertension: Secondary | ICD-10-CM

## 2020-03-08 NOTE — Telephone Encounter (Signed)
I would continue the lasix 80mg  in AM and 40mg  in pm, needs a bmet/mg at the end of the week   Zandra Abts MD

## 2020-03-08 NOTE — Telephone Encounter (Signed)
7/7 - 167.2 7/8 - 163.8 7/9 - 164.8 7/10 - 164.8  7/11 - 165.2 7/12 - 165  Doing the extra was keeping the weights down.  Still has some swelling in hands & ankles.

## 2020-03-08 NOTE — Telephone Encounter (Signed)
Daughter [name]) notified.  She will take her to [facility] across from [facility] on Friday, 03/12/2020.  Orders entered into epic.

## 2020-03-08 NOTE — Telephone Encounter (Signed)
ERROR

## 2020-03-08 NOTE — Progress Notes (Signed)
ERROR

## 2020-03-08 NOTE — Telephone Encounter (Signed)
NEW MESSAGE    MESSAGE WAS LEFT ON VOICEMAIL BY lAURIE THAT SHE WAS CALLING BACK REGARDING A WEIGHT CHECK FOR PATIENT

## 2020-03-10 ENCOUNTER — Ambulatory Visit: Payer: Medicare Other | Admitting: Obstetrics and Gynecology

## 2020-03-10 DIAGNOSIS — R42 Dizziness and giddiness: Secondary | ICD-10-CM | POA: Diagnosis not present

## 2020-03-10 DIAGNOSIS — H903 Sensorineural hearing loss, bilateral: Secondary | ICD-10-CM | POA: Diagnosis not present

## 2020-03-12 DIAGNOSIS — R601 Generalized edema: Secondary | ICD-10-CM | POA: Diagnosis not present

## 2020-03-12 DIAGNOSIS — I10 Essential (primary) hypertension: Secondary | ICD-10-CM | POA: Diagnosis not present

## 2020-03-13 LAB — BASIC METABOLIC PANEL
BUN/Creatinine Ratio: 21 (calc) (ref 6–22)
BUN: 24 mg/dL (ref 7–25)
CO2: 29 mmol/L (ref 20–32)
Calcium: 9.6 mg/dL (ref 8.6–10.4)
Chloride: 105 mmol/L (ref 98–110)
Creat: 1.15 mg/dL — ABNORMAL HIGH (ref 0.60–0.88)
Glucose, Bld: 115 mg/dL — ABNORMAL HIGH (ref 65–99)
Potassium: 3.7 mmol/L (ref 3.5–5.3)
Sodium: 141 mmol/L (ref 135–146)

## 2020-03-13 LAB — MAGNESIUM: Magnesium: 2 mg/dL (ref 1.5–2.5)

## 2020-03-15 ENCOUNTER — Ambulatory Visit: Payer: Medicare Other | Admitting: Obstetrics and Gynecology

## 2020-03-15 ENCOUNTER — Encounter: Payer: Self-pay | Admitting: Obstetrics and Gynecology

## 2020-03-15 VITALS — BP 130/71 | HR 83 | Ht 66.0 in | Wt 164.0 lb

## 2020-03-15 DIAGNOSIS — N390 Urinary tract infection, site not specified: Secondary | ICD-10-CM | POA: Diagnosis not present

## 2020-03-15 DIAGNOSIS — R3981 Functional urinary incontinence: Secondary | ICD-10-CM

## 2020-03-15 DIAGNOSIS — R319 Hematuria, unspecified: Secondary | ICD-10-CM | POA: Diagnosis not present

## 2020-03-15 LAB — POCT URINALYSIS DIPSTICK
Glucose, UA: NEGATIVE
Ketones, UA: NEGATIVE
Leukocytes, UA: NEGATIVE
Nitrite, UA: NEGATIVE
Protein, UA: NEGATIVE

## 2020-03-15 NOTE — Progress Notes (Signed)
Patient ID: [name], female   DOB: 22-Feb-1926, 86 y.o.   MRN: [id number]    [facility] Visit  @DATE @            Patient name: [name] MRN [id number]  Date of birth: [birth year]  CC & HPI:  [name] is a 86 y.o. female presenting today for recurrent UTIs. She was last seen here on 02/09/2020 and was prescribed Keflex. She saw her PCP on 02/26/2020 and still had a UTI, so she was prescribed Ampicillin TID for 7 days. The patient is accompanied by her daughter today.  She reports that she no longer has symptoms when she has UTIs. Denies burning with urination. She does have urinary incontinence. The patient denies fever, chills or any other symptoms or complaints at this time.  She is scheduled to see urologist for probable cystoscopy next month ROS:  ROS  + urinary incontinence - burning with urination - fever - chills All systems are negative except as noted in the HPI and PMH.   Pertinent History Reviewed:   Reviewed: Medical         Past Medical History:  Diagnosis Date  . Annual physical exam 04/22/2015  . Anxiety   . Arthritis   . Arthritis   . Asthma   . Atrial fibrillation ([place])   . Bleeding disorder ([place])   . Gastroesophageal reflux disease   . Hearing impairment   . Heart disease   . Heart murmur   . High cholesterol   . Hyperlipidemia   . Hypertension   . Hypothyroidism   . Impaired glucose tolerance   . Macular degeneration   . Pancreatitis   . Paroxysmal atrial fibrillation ([place]) 2011  . Sick sinus syndrome ([place]) 2011   Atrial fibrilation 10/2009; and dual chamber pacemaker in 4/11; normal EF  . Sleep apnea    Nocturnal oxygen therapy  . Zoster 2016                              Surgical Hx:    Past Surgical History:  Procedure Laterality Date  . ABDOMINAL HYSTERECTOMY    . CATARACT EXTRACTION     Bilateral  . COLONOSCOPY  2009   Dr. Gala Romney  . EYE SURGERY     laser   . PACEMAKER INSERTION  2011   dual chamber   . PPM  GENERATOR CHANGEOUT N/A 11/13/2016   Procedure: PPM Generator Changeout;  Surgeon: Evans Lance, MD;  Location: [facility];  Service: Cardiovascular;  Laterality: N/A;  . SALPINGOOPHORECTOMY  1970   right   Medications: Reviewed & Updated - see associated section                       Current Outpatient Medications:  .  busPIRone (BUSPAR) 5 MG tablet, TAKE 1 TABLET BY MOUTH TWICE DAILY, Disp: 60 tablet, Rfl: 5 .  Calcium Carb-Cholecalciferol 500-400 MG-UNIT CHEW, Chew 1 tablet by mouth daily. , Disp: 60 tablet, Rfl:  .  diltiazem (CARDIZEM CD) 240 MG 24 hr capsule, Take 1 capsule (240 mg total) by mouth daily., Disp: 30 capsule, Rfl: 11 .  donepezil (ARICEPT) 5 MG tablet, TAKE (1) TABLET BY MOUTH AT BEDTIME., Disp: 90 tablet, Rfl: 1 .  fluticasone (FLONASE) 50 MCG/ACT nasal spray, Place 1 spray into both nostrils daily., Disp: , Rfl:  .  furosemide (LASIX) 40 MG  tablet, Take 80 mg (2 Tablets)  Alternating days with 60 mg(1 1/2 Tablets) Daily (Patient taking differently: Take 80 mg (2 Tablets)  In the am; 40 mg in the afternoon), Disp: 180 tablet, Rfl: 3 .  levothyroxine (SYNTHROID) 112 MCG tablet, TAKE 1 TABLET BY MOUTH ONCE DAILY., Disp: 90 tablet, Rfl: 0 .  loratadine (CLARITIN) 10 MG tablet, Take 1 tablet (10 mg total) by mouth daily., Disp: 30 tablet, Rfl: 1 .  lovastatin (MEVACOR) 40 MG tablet, TAKE ONE TABLET BY MOUTH AT BEDTIME., Disp: 90 tablet, Rfl: 0 .  meclizine (ANTIVERT) 12.5 MG tablet, Take 1 tablet (12.5 mg total) by mouth 2 (two) times daily as needed for dizziness., Disp: 30 tablet, Rfl: 0 .  metoprolol tartrate (LOPRESSOR) 100 MG tablet, TAKE 1 TABLET BY MOUTH TWICE A DAY., Disp: 180 tablet, Rfl: 2 .  Multiple Vitamins-Minerals (EYE VITAMINS & MINERALS PO), Take 1 tablet by mouth daily., Disp: , Rfl:  .  omeprazole (PRILOSEC) 40 MG capsule, TAKE (1) CAPSULE BY MOUTH ONCE DAILY FOR ACID REFLUX., Disp: 30 capsule, Rfl: 5 .  OXYGEN-HELIUM IN, Inhale 2 L into the lungs at  bedtime., Disp: , Rfl:  .  Potassium Gluconate 2.5 MEQ TABS, Take by mouth., Disp: , Rfl:  .  warfarin (COUMADIN) 5 MG tablet, TAKE 1/2 TABLET BY MOUTH DAILY EXCEPT 1 TABLET ON WEDNESDAYS & SATURDAYS OR AS DIRECTED., Disp: 25 tablet, Rfl: 6  Current Facility-Administered Medications:  .  miconazole (MONISTAT 7) 2 % vaginal cream 1 Applicatorful, 1 Applicatorful, Vaginal, QHS, Jonnie Kind, MD   Social History: Reviewed -  reports that she has never smoked. She has never used smokeless tobacco.  Objective Findings:  Vitals: Blood pressure 130/71, pulse 83, height 5\' 6"  (1.676 m), weight 164 lb (74.4 kg).  PHYSICAL EXAMINATION General appearance - alert, well appearing, and in no distress, oriented to person, place, and time and overweight Mental status - alert, oriented to person, place, and time, normal mood, behavior, speech, dress, motor activity, and thought processes, affect appropriate to mood  PELVIC Vagina - normal tissue well supported, atrophic.  The patient loses urine control with even minimal Valsalva maneuver to raise her head up and slide down the bed Cervix - surgically absent Uterus - surgically absent well supported cuff  Assessment & Plan:   A:  1. Recurrent UTIs 2. Chronic urinary  incontinence, urge versus intrinsic sphincter,  P:  1.  Urine Culture  2. F/U prn after urologist evaluation  By signing my name below, I, De Burrs, attest that this documentation has been prepared under the direction and in the presence of Jonnie Kind, MD. Electronically Signed: De Burrs, Medical Scribe. 03/15/20. 3:30 PM.  I personally performed the services described in this documentation, which was SCRIBED in my presence. The recorded information has been reviewed and considered accurate. It has been edited as necessary during review. Jonnie Kind, MD

## 2020-03-16 ENCOUNTER — Telehealth: Payer: Self-pay | Admitting: Urology

## 2020-03-16 ENCOUNTER — Telehealth: Payer: Self-pay

## 2020-03-16 ENCOUNTER — Encounter: Payer: Self-pay | Admitting: Family Medicine

## 2020-03-16 ENCOUNTER — Other Ambulatory Visit: Payer: Self-pay

## 2020-03-16 ENCOUNTER — Ambulatory Visit (INDEPENDENT_AMBULATORY_CARE_PROVIDER_SITE_OTHER): Payer: Medicare Other | Admitting: Family Medicine

## 2020-03-16 VITALS — BP 109/66 | HR 72 | Resp 15 | Ht 66.0 in | Wt 164.4 lb

## 2020-03-16 DIAGNOSIS — I4891 Unspecified atrial fibrillation: Secondary | ICD-10-CM

## 2020-03-16 DIAGNOSIS — R42 Dizziness and giddiness: Secondary | ICD-10-CM

## 2020-03-16 DIAGNOSIS — N390 Urinary tract infection, site not specified: Secondary | ICD-10-CM

## 2020-03-16 DIAGNOSIS — E785 Hyperlipidemia, unspecified: Secondary | ICD-10-CM

## 2020-03-16 DIAGNOSIS — I10 Essential (primary) hypertension: Secondary | ICD-10-CM | POA: Diagnosis not present

## 2020-03-16 DIAGNOSIS — E038 Other specified hypothyroidism: Secondary | ICD-10-CM | POA: Diagnosis not present

## 2020-03-16 NOTE — Telephone Encounter (Signed)
New Message     Patient daughter called does Dr Harl Bowie want her mother to continue taking the 80 mg of Lasik in the morning and the 40 mg at night?  When she doesn't take the Lasik she put weight on , when she stays on the lasik her weight is around 160

## 2020-03-16 NOTE — Telephone Encounter (Signed)
[id number]-cc 

## 2020-03-16 NOTE — Telephone Encounter (Signed)
Spoke with daughter. Increase in bleeding - PCP reports UTI but wants to wait to treat until seen by urologist. Reports pt has been treated for UTI since last November.

## 2020-03-16 NOTE — Telephone Encounter (Signed)
Pt daughter called and requested she been ASAP due to bleeding with UTI symptoms. Dr. Glo Herring told them to call and schedule ASAP

## 2020-03-16 NOTE — Patient Instructions (Addendum)
Follow-up in office with MD in 3 months call if you need me sooner.  Very important important that you keep your appointment with urology since you continue to have a persistent urinary tract infection.  Please do take a probiotic daily to reduce your risk of C. difficile infection from prolonged antibiotic use.  Please continue to be careful not to fall using your walker at all times or a cane.  Fasting lipids CMP and EGFR TSH and CBC 5 days before your next visit.  Thanks for choosing [facility], we consider it a privelige to serve you.

## 2020-03-16 NOTE — Telephone Encounter (Signed)
Pt had bmet done.Does she stay on lasix 80 am, 40 afternoon? Daughter says it is the only way she can keep the weight off

## 2020-03-16 NOTE — Telephone Encounter (Signed)
NEW MESSAGE     Pt daughter missed return call, she has her phone with her now, please call back

## 2020-03-17 NOTE — Telephone Encounter (Signed)
Labs look fine, if 80mg /40mg  is working I would conitnue   Zandra Abts MD

## 2020-03-17 NOTE — Telephone Encounter (Signed)
Daughter notified 

## 2020-03-18 ENCOUNTER — Telehealth: Payer: Self-pay | Admitting: Urology

## 2020-03-18 NOTE — Telephone Encounter (Signed)
Pt daughter called stating heart Dr. Is concerned  about pt getting to nervous and and possibly having a stroke or other heart problems while doing the cysto and asked if maybe a ct could be done instead. Daughter wants to know if this is an option or will it be best to go ahead with the cysto. Please advise.

## 2020-03-18 NOTE — Telephone Encounter (Signed)
PTS DAUGHTER CALLED AND REQUESTS A NURSE CALL HER BACK.

## 2020-03-19 LAB — URINE CULTURE

## 2020-03-21 ENCOUNTER — Telehealth: Payer: Self-pay | Admitting: Obstetrics and Gynecology

## 2020-03-21 NOTE — Telephone Encounter (Signed)
Urine culture Pos for E coli. I recall giving Rx for Uti, but is not recorded in Epic.  [name] asked to notify us if mother did not get a rx.

## 2020-03-22 ENCOUNTER — Encounter: Payer: Self-pay | Admitting: Family Medicine

## 2020-03-22 NOTE — Assessment & Plan Note (Signed)
Controlled, no change in medication DASH diet and commitment to daily physical activity for a minimum of 30 minutes discussed and encouraged, as a part of hypertension management. The importance of attaining a healthy weight is also discussed.  BP/Weight 03/16/2020 03/15/2020 02/25/2020 02/09/2020 01/21/2020 12/26/2019 09/07/347  Systolic BP 611 643 539 122 583 462 194  Diastolic BP 66 71 77 59 70 78 70  Wt. (Lbs) 164.4 164 164.8 163 167 - 163.8  BMI 26.53 26.47 26.6 26.31 26.95 - 26.44

## 2020-03-22 NOTE — Assessment & Plan Note (Signed)
Rate controlled with pacemeaker

## 2020-03-22 NOTE — Progress Notes (Signed)
   [name]     MRN: [id number]      DOB: 1926-08-19   HPI [name] is here for follow up and re-evaluation of chronic medical conditions, medication management and review of any available recent lab and radiology data.  Preventive health is updated, specifically  Cancer screening and Immunization.   Has upcoming Urology appt, and was seen by Gyne recently also for persistent/ recurrent uTI Denies fever or chills, feels well, denies any current burning with urination  ROS Denies recent fever or chills. Denies sinus pressure, nasal congestion, ear pain or sore throat. Denies chest congestion,has chronic  cough and  wheezing. Denies chest pains, palpitations and leg swelling Denies abdominal pain, nausea, vomiting,diarrhea or constipation.   Denies dysuria, frequency, hesitancy or incontinence. C/o chronic  joint pain, swelling and limitation in mobility. Denies headaches, seizures, numbness, or tingling. Denies depression, anxiety or insomnia. Denies skin break down or rash.   PE  BP 109/66   Pulse 72   Resp 15   Ht 5\' 6"  (1.676 m)   Wt 164 lb 6.4 oz (74.6 kg)   SpO2 97%   BMI 26.53 kg/m   Patient alert and oriented and in no cardiopulmonary distress.  HEENT: No facial asymmetry, EOMI,     Neck decreased ROM .  Chest: Clear to auscultation bilaterally.  CVS: S1, S2 no murmurs, no S3.Regular rate.  ABD: Soft non tender.   Ext: No edema  MS: decreased  ROM spine, shoulders, hips and knees.  Skin: Intact, no ulcerations or rash noted.  Psych: Good eye contact, normal affect.  not anxious or depressed appearing.  CNS: CN 2-12 intact, power,  normal throughout.no focal deficits noted.   Assessment & Plan  Essential hypertension Controlled, no change in medication DASH diet and commitment to daily physical activity for a minimum of 30 minutes discussed and encouraged, as a part of hypertension management. The importance of attaining a healthy weight is also  discussed.  BP/Weight 03/16/2020 03/15/2020 02/25/2020 02/09/2020 01/21/2020 12/26/2019 1/95/0932  Systolic BP 671 245 809 983 382 505 397  Diastolic BP 66 71 77 59 70 78 70  Wt. (Lbs) 164.4 164 164.8 163 167 - 163.8  BMI 26.53 26.47 26.6 26.31 26.95 - 26.44       Hypothyroidism Controlled, no change in medication      Hyperlipidemia Controlled, no change in medication Hyperlipidemia:Low fat diet discussed and encouraged.   Lipid Panel  Lab Results  Component Value Date   CHOL 132 11/04/2019   HDL 63 11/04/2019   LDLCALC 50 11/04/2019   TRIG 105 11/04/2019   CHOLHDL 2.1 11/04/2019       Vertigo Has upcoming ENT appt for recurrent vertigo  Recurrent UTI Pt to complete current antibiotic course and f/u with Urology  Atrial fibrillation with RVR (HCC) Rate controlled with pacemeaker

## 2020-03-22 NOTE — Assessment & Plan Note (Addendum)
Controlled, no change in medication  

## 2020-03-22 NOTE — Assessment & Plan Note (Signed)
Pt to complete current antibiotic course and f/u with Urology

## 2020-03-22 NOTE — Assessment & Plan Note (Signed)
Controlled, no change in medication Hyperlipidemia:Low fat diet discussed and encouraged.   Lipid Panel  Lab Results  Component Value Date   CHOL 132 11/04/2019   HDL 63 11/04/2019   LDLCALC 50 11/04/2019   TRIG 105 11/04/2019   CHOLHDL 2.1 11/04/2019

## 2020-03-22 NOTE — Assessment & Plan Note (Signed)
Has upcoming ENT appt for recurrent vertigo

## 2020-03-23 ENCOUNTER — Other Ambulatory Visit: Payer: Self-pay

## 2020-03-23 ENCOUNTER — Telehealth: Payer: Self-pay | Admitting: Urology

## 2020-03-23 DIAGNOSIS — N390 Urinary tract infection, site not specified: Secondary | ICD-10-CM

## 2020-03-23 NOTE — Telephone Encounter (Signed)
Pt daughter called and stated she has not received a phone call back yet, and would like to discuss what the doctor wants to do regarding her appt tomorrow.

## 2020-03-23 NOTE — Telephone Encounter (Signed)
I would get a BMP and di a CT hematuria protocol on the patient before we would do a cysto

## 2020-03-23 NOTE — Telephone Encounter (Signed)
Pt. daugther called, made aware that appt tomorrow was not needed, and appt had been made for lab, CT , and 1 month OV w/ cysto. Daughter asked for Dr. Alyson Ingles to look at culture done at PCP due to them not treating b/c pt was coming to urologist. Will send Dr. Alyson Ingles a message.

## 2020-03-24 ENCOUNTER — Other Ambulatory Visit: Payer: Self-pay

## 2020-03-24 ENCOUNTER — Telehealth: Payer: Self-pay | Admitting: Cardiology

## 2020-03-24 ENCOUNTER — Other Ambulatory Visit: Payer: Self-pay | Admitting: Obstetrics and Gynecology

## 2020-03-24 ENCOUNTER — Telehealth: Payer: Self-pay | Admitting: Urology

## 2020-03-24 ENCOUNTER — Telehealth: Payer: Self-pay | Admitting: Obstetrics and Gynecology

## 2020-03-24 ENCOUNTER — Ambulatory Visit: Payer: Medicare Other | Admitting: Urology

## 2020-03-24 DIAGNOSIS — N390 Urinary tract infection, site not specified: Secondary | ICD-10-CM

## 2020-03-24 MED ORDER — FUROSEMIDE 40 MG PO TABS: ORAL_TABLET | ORAL | 3 refills | Status: DC

## 2020-03-24 MED ORDER — CEPHALEXIN 500 MG PO CAPS
500.0000 mg | ORAL_CAPSULE | Freq: Four times a day (QID) | ORAL | 0 refills | Status: DC
Start: 2020-03-24 — End: 2020-05-07

## 2020-03-24 MED ORDER — AMOXICILLIN-POT CLAVULANATE 875-125 MG PO TABS: 1.0000 | ORAL_TABLET | Freq: Two times a day (BID) | ORAL | 0 refills | Status: AC

## 2020-03-24 NOTE — Telephone Encounter (Signed)
Left message @ 11:08 am. JSY

## 2020-03-24 NOTE — Telephone Encounter (Signed)
Daughter made aware that rx has been sent in.

## 2020-03-24 NOTE — Telephone Encounter (Signed)
Daughter called and made aware of Rx sent

## 2020-03-24 NOTE — Telephone Encounter (Signed)
New message   Need new prescription for patient to take 80 in morning and 40 at night  *STAT* If patient is at the pharmacy, call can be transferred to refill team.   1. Which medications need to be refilled? (please list name of each medication and dose if known)  furosemide (LASIX) 40 MG tablet Take 80 mg (2 Tablets) Alternating days with 60 mg(1 1/2 Tablets) DailyPatient taking differently: Take 80 mg (2 Tablets) In the am; 40 mg in the afternoon     2. Which pharmacy/location (including street and city if local pharmacy) is medication to be sent to? [place]  3. Do they need a 30 day or 90 day supply? [place]

## 2020-03-24 NOTE — Progress Notes (Signed)
Rx sent 

## 2020-03-24 NOTE — Telephone Encounter (Signed)
Refill complete 

## 2020-03-24 NOTE — Telephone Encounter (Signed)
Pts daughter called and asked if a medication could be sent in for pts UTI

## 2020-03-24 NOTE — Telephone Encounter (Signed)
Patient would like something sent to pharmacy for uti symptoms. Patient daughter called.

## 2020-03-24 NOTE — Telephone Encounter (Addendum)
Pt's urine culture was + for e.coli. Pt needs to be treated. Please send med to [facility]. Thanks!! [place]

## 2020-03-24 NOTE — Progress Notes (Signed)
Rx'd for E Coli UTI S to cephalosporins, R to cipro and Macrodantin

## 2020-03-25 ENCOUNTER — Other Ambulatory Visit: Payer: Self-pay | Admitting: *Deleted

## 2020-03-25 ENCOUNTER — Telehealth: Payer: Self-pay

## 2020-03-25 MED ORDER — DONEPEZIL HCL 5 MG PO TABS: ORAL_TABLET | ORAL | 1 refills | Status: DC

## 2020-03-25 NOTE — Telephone Encounter (Signed)
Pt medication sent to pharmacy  

## 2020-03-25 NOTE — Telephone Encounter (Signed)
donepezil (ARICEPT) 5 MG tablet  Please send to pharmacy

## 2020-03-27 ENCOUNTER — Other Ambulatory Visit: Payer: Self-pay | Admitting: Family Medicine

## 2020-03-30 ENCOUNTER — Other Ambulatory Visit: Payer: Self-pay

## 2020-03-30 ENCOUNTER — Other Ambulatory Visit: Payer: Medicare Other

## 2020-03-30 DIAGNOSIS — N390 Urinary tract infection, site not specified: Secondary | ICD-10-CM

## 2020-03-31 LAB — BASIC METABOLIC PANEL
BUN/Creatinine Ratio: 19 (ref 12–28)
BUN: 28 mg/dL (ref 10–36)
CO2: 26 mmol/L (ref 20–29)
Calcium: 9.8 mg/dL (ref 8.7–10.3)
Chloride: 101 mmol/L (ref 96–106)
Creatinine, Ser: 1.44 mg/dL — ABNORMAL HIGH (ref 0.57–1.00)
GFR calc Af Amer: 36 mL/min/{1.73_m2} — ABNORMAL LOW (ref >59)
GFR calc non Af Amer: 32 mL/min/{1.73_m2} — ABNORMAL LOW (ref >59)
Glucose: 100 mg/dL — ABNORMAL HIGH (ref 65–99)
Potassium: 4.4 mmol/L (ref 3.5–5.2)
Sodium: 142 mmol/L (ref 134–144)

## 2020-04-02 DIAGNOSIS — R0902 Hypoxemia: Secondary | ICD-10-CM | POA: Diagnosis not present

## 2020-04-14 ENCOUNTER — Other Ambulatory Visit (HOSPITAL_COMMUNITY): Payer: Medicare Other

## 2020-04-20 ENCOUNTER — Other Ambulatory Visit: Payer: Medicare Other | Admitting: Urology

## 2020-04-22 ENCOUNTER — Ambulatory Visit: Payer: Medicare Other | Admitting: Urology

## 2020-04-28 ENCOUNTER — Ambulatory Visit: Payer: Medicare Other | Admitting: Diagnostic Neuroimaging

## 2020-04-29 ENCOUNTER — Ambulatory Visit (INDEPENDENT_AMBULATORY_CARE_PROVIDER_SITE_OTHER): Payer: Medicare Other | Admitting: *Deleted

## 2020-04-29 ENCOUNTER — Telehealth: Payer: Self-pay

## 2020-04-29 DIAGNOSIS — I495 Sick sinus syndrome: Secondary | ICD-10-CM

## 2020-04-29 NOTE — Telephone Encounter (Signed)
The pt daughter called because she tried to send a transmission with the pt monitor but it was not working. She called Social worker support and they are sending her a new adapter. She wants to know why no one told her the monitor not working? The monitor did its weekly check on 04/23/2020 for alerts but did not send a full transmission. I told her since [facility] communicate weekly we would not know it was not working until Architectural technologist. She states this happen before and no one called her. I told her I will have the nurse give her a call back.

## 2020-04-29 NOTE — Telephone Encounter (Signed)
Spoke to daughter [name] [facility]) she is concerned patient will not be monitored while her home remote box is not working. Advised her she can come to the [facility] office tomorrow to pick up a adaptor. Agreeable to plan.

## 2020-04-30 ENCOUNTER — Telehealth: Payer: Self-pay

## 2020-04-30 LAB — CUP PACEART REMOTE DEVICE CHECK
Battery Remaining Longevity: 117 mo
Battery Remaining Percentage: 95.5 %
Battery Voltage: 2.99 V
Brady Statistic RV Percent Paced: 75 %
Date Time Interrogation Session: 20210902150746
Implantable Lead Implant Date: 20110422
Implantable Lead Implant Date: 20110422
Implantable Lead Location: 753859
Implantable Lead Location: 753860
Implantable Pulse Generator Implant Date: 20180319
Lead Channel Impedance Value: 410 Ohm
Lead Channel Pacing Threshold Amplitude: 1 V
Lead Channel Pacing Threshold Pulse Width: 0.5 ms
Lead Channel Sensing Intrinsic Amplitude: 4.2 mV
Lead Channel Setting Pacing Amplitude: 2 V
Lead Channel Setting Pacing Pulse Width: 0.5 ms
Lead Channel Setting Sensing Sensitivity: 1 mV
Pulse Gen Model: 2272
Pulse Gen Serial Number: 8010902

## 2020-04-30 NOTE — Telephone Encounter (Signed)
I have the adaptor in the [place] office with [place]. It is waiting at the front desk.

## 2020-04-30 NOTE — Telephone Encounter (Signed)
The pt daughter states she got a phone call from here. I did not see it. She states she is still coming to [facility] to get the adapter for her monitor.

## 2020-05-03 DIAGNOSIS — R0902 Hypoxemia: Secondary | ICD-10-CM | POA: Diagnosis not present

## 2020-05-04 NOTE — Progress Notes (Signed)
Remote pacemaker transmission.   

## 2020-05-06 NOTE — Progress Notes (Signed)
Virtual Visit via Telephone Note   This visit type was conducted due to national recommendations for restrictions regarding the COVID-19 Pandemic (e.g. social distancing) in an effort to limit this patient's exposure and mitigate transmission in our community.  Due to her co-morbid illnesses, this patient is at least at moderate risk for complications without adequate follow up.  This format is felt to be most appropriate for this patient at this time.  The patient did not have access to video technology/had technical difficulties with video requiring transitioning to audio format only (telephone).  All issues noted in this document were discussed and addressed.  No physical exam could be performed with this format.  Please refer to the patient's chart for her  consent to telehealth for [facility].    Date:  4/58/0998   ID:  [name], DOB [birth year], MRN [id number] The patient was identified using 2 identifiers.  Patient Location: Home Provider Location: Office/Clinic  PCP:  Fayrene Helper, MD  Cardiologist:  Carlyle Dolly, MD  Electrophysiologist:  None   Evaluation Performed:  Follow-Up Visit  Chief Complaint:  Atrial fib.   History of Present Illness:    [name] is a 86 y.o. female with chronic atrial fib.    Presented by phone call for chronicAF and not interested in NOAcs  - on coumadin She ( last seen 11/11/19) stopped HCTZ due to hyponatremia.  Hx of PPM ( [employer])for CHB followed by Dr. Lovena Le. Does have hx of falls.  HLD on statin Edema on lasix 80 in AM and 40 in pm   Last pacer eval 04/29/20 leads and battery stable.    Last EKG 06/2019  Labs 03/30/20 with Cr 1.44 up from 1.15 Na 142, K+ 4.4  Remote device checked 04/30/20 with leads and battery stable [employer]e  Today she is doing well. No chest pain or SOB.  No lightheadedness. She had been having dizziness but meclozine has taken care of it.  Her INR has not been checked due to her daughter having  COVID.  She will go next week. No bleeding issues.  She has had UTI and was to have CT scan with urology daughter to call and reschedule.   It has been a year since she saw Dr. Lovena Le.  Will have her seen in November.     Her daughter just had COVID last month and did ok.  She self isolated and protected her mother.    The patient does not have symptoms concerning for COVID-19 infection (fever, chills, cough, or new shortness of breath).    Past Medical History:  Diagnosis Date  . Annual physical exam 04/22/2015  . Anxiety   . Arthritis   . Arthritis   . Asthma   . Atrial fibrillation ([place])   . Bleeding disorder ([place])   . Gastroesophageal reflux disease   . Hearing impairment   . Heart disease   . Heart murmur   . High cholesterol   . Hyperlipidemia   . Hypertension   . Hypothyroidism   . Impaired glucose tolerance   . Macular degeneration   . Pancreatitis   . Paroxysmal atrial fibrillation ([place]) 2011  . Sick sinus syndrome ([place]) 2011   Atrial fibrilation 10/2009; and dual chamber pacemaker in 4/11; normal EF  . Sleep apnea    Nocturnal oxygen therapy  . Zoster 2016   Past Surgical History:  Procedure Laterality Date  . ABDOMINAL HYSTERECTOMY    .  CATARACT EXTRACTION     Bilateral  . COLONOSCOPY  2009   Dr. Gala Romney  . EYE SURGERY     laser   . PACEMAKER INSERTION  2011   dual chamber   . PPM GENERATOR CHANGEOUT N/A 11/13/2016   Procedure: PPM Generator Changeout;  Surgeon: Evans Lance, MD;  Location: [place] [facility];  Service: Cardiovascular;  Laterality: N/A;  . SALPINGOOPHORECTOMY  1970   right     Current Meds  Medication Sig  . busPIRone (BUSPAR) 5 MG tablet TAKE 1 TABLET BY MOUTH TWICE DAILY  . Calcium Carb-Cholecalciferol 500-400 MG-UNIT CHEW Chew 1 tablet by mouth daily.   Marland Kitchen diltiazem (CARDIZEM CD) 240 MG 24 hr capsule Take 1 capsule (240 mg total) by mouth daily.  Marland Kitchen donepezil (ARICEPT) 5 MG tablet TAKE (1) TABLET BY MOUTH AT BEDTIME.  . fluticasone  (FLONASE) 50 MCG/ACT nasal spray Place 1 spray into both nostrils daily.  . furosemide (LASIX) 40 MG tablet Take 80 mg (2 Tablets)  In the am; 40 mg in the afternoon  . levothyroxine (SYNTHROID) 112 MCG tablet TAKE 1 TABLET BY MOUTH ONCE DAILY.  Marland Kitchen loratadine (CLARITIN) 10 MG tablet Take 1 tablet (10 mg total) by mouth daily.  Marland Kitchen lovastatin (MEVACOR) 40 MG tablet TAKE ONE TABLET BY MOUTH AT BEDTIME.  . meclizine (ANTIVERT) 12.5 MG tablet Take 1 tablet (12.5 mg total) by mouth 2 (two) times daily as needed for dizziness.  . metoprolol tartrate (LOPRESSOR) 100 MG tablet TAKE 1 TABLET BY MOUTH TWICE A DAY.  . Multiple Vitamins-Minerals (EYE VITAMINS & MINERALS PO) Take 1 tablet by mouth daily.  Marland Kitchen omeprazole (PRILOSEC) 40 MG capsule TAKE (1) CAPSULE BY MOUTH ONCE DAILY FOR ACID REFLUX.  Marland Kitchen OXYGEN-HELIUM IN Inhale 2 L into the lungs at bedtime.  . Potassium Gluconate 2.5 MEQ TABS Take by mouth.  . warfarin (COUMADIN) 5 MG tablet TAKE 1/2 TABLET BY MOUTH DAILY EXCEPT 1 TABLET ON WEDNESDAYS & SATURDAYS OR AS DIRECTED.   Current Facility-Administered Medications for the 05/07/20 encounter (Telemedicine) with Isaiah Serge, NP  Medication  . miconazole (MONISTAT 7) 2 % vaginal cream 1 Applicatorful     Allergies:   Patient has no known allergies.   Social History   Tobacco Use  . Smoking status: Never Smoker  . Smokeless tobacco: Never Used  Vaping Use  . Vaping Use: Never used  Substance Use Topics  . Alcohol use: No    Alcohol/week: 0.0 standard drinks  . Drug use: No     Family Hx: The patient's family history includes Arthritis in an other family member; Asthma in an other family member; Cancer in her father and mother; Emphysema in her sister; Heart disease in an other family member; Lung disease in an other family member; Melanoma in her son; Mitral valve prolapse in her sister; Pneumonia in her sister.  ROS:   Please see the history of present illness.    General:no colds or fevers,  no weight changes Skin:no rashes or ulcers HEENT:no blurred vision, no congestion CV:see HPI PUL:see HPI GI:no diarrhea constipation or melena, no indigestion GU:no hematuria, no dysuria MS:no joint pain, no claudication Neuro:no syncope, no lightheadedness Endo:no diabetes, no thyroid disease  All other systems reviewed and are negative.   Prior CV studies:   The following studies were reviewed today:  Echo 07/04/19  IMPRESSIONS    1. Left ventricular ejection fraction, by visual estimation, is 65 to  70%. The left ventricle has  normal function. There is mildly increased  left ventricular hypertrophy.  2. Left ventricular diastolic parameters are indeterminate in the setting  of atrial fibrillation.  3. Global right ventricle has normal systolic function.The right  ventricular size is mildly enlarged. No increase in right ventricular wall  thickness.  4. Left atrial size was upper normal.  5. Right atrial size was upper normal.  6. Mild mitral annular calcification.  7. Moderate aortic valve annular calcification.  8. The mitral valve is grossly normal. Mild mitral valve regurgitation.  9. The tricuspid valve is grossly normal. Tricuspid valve regurgitation  is trivial.  10. The aortic valve is tricuspid. Aortic valve regurgitation is not  visualized. Mild aortic valve sclerosis without stenosis.  11. The pulmonic valve was grossly normal. Pulmonic valve regurgitation is  trivial.  12. A pacer wire is visualized in the RV and RA.  13. The inferior vena cava is normal in size with greater than 50%  respiratory variability, suggesting right atrial pressure of 3 mmHg.  14. Mildly elevated pulmonary artery systolic pressure.  15. The tricuspid regurgitant velocity is 2.71 m/s, and with an assumed  right atrial pressure of 3 mmHg, the estimated right ventricular systolic  pressure is mildly elevated at 32.4 mmHg.   FINDINGS  Left Ventricle: Left ventricular  ejection fraction, by visual estimation,  is 65 to 70%. The left ventricle has normal function. The left ventricular  internal cavity size was the left ventricle is normal in size. There is  mildly increased left  ventricular hypertrophy. Left ventricular diastolic parameters are  indeterminate.   Right Ventricle: The right ventricular size is mildly enlarged. No  increase in right ventricular wall thickness. Global RV systolic function  is has normal systolic function. The tricuspid regurgitant velocity is  2.71 m/s, and with an assumed right atrial  pressure of 3 mmHg, the estimated right ventricular systolic pressure is  mildly elevated at 32.4 mmHg.   Left Atrium: Left atrial size was upper normal.   Right Atrium: Right atrial size was upper normal   Pericardium: There is no evidence of pericardial effusion.   Mitral Valve: The mitral valve is grossly normal. Mild mitral annular  calcification. Mild mitral valve regurgitation, with posteriorly-directed  jet.   Tricuspid Valve: The tricuspid valve is grossly normal. Tricuspid valve  regurgitation is trivial.   Aortic Valve: The aortic valve is tricuspid. Aortic valve regurgitation is  not visualized. Mild aortic valve sclerosis is present, with no evidence  of aortic valve stenosis. Moderate aortic valve annular calcification.   Pulmonic Valve: The pulmonic valve was grossly normal. Pulmonic valve  regurgitation is trivial.   Aorta: The aortic root is normal in size and structure.   Venous: The inferior vena cava is normal in size with greater than 50%  respiratory variability, suggesting right atrial pressure of 3 mmHg.   IAS/Shunts: No atrial level shunt detected by color flow Doppler.   Labs/Other Tests and Data Reviewed:    EKG:  An ECG dated 07/17/2019 was personally reviewed today and demonstrated:  atrial fib with RBBB and LAD and occ paced beat  Recent Labs: 07/03/2019: B Natriuretic Peptide 232.0 11/04/2019:  ALT 9; Hemoglobin 10.7; Platelets 290; TSH 2.88 03/12/2020: Magnesium 2.0 03/30/2020: BUN 28; Creatinine, Ser 1.44; Potassium 4.4; Sodium 142   Recent Lipid Panel Lab Results  Component Value Date/Time   CHOL 132 11/04/2019 10:27 AM   TRIG 105 11/04/2019 10:27 AM   HDL 63 11/04/2019 10:27 AM   CHOLHDL  2.1 11/04/2019 10:27 AM   LDLCALC 50 11/04/2019 10:27 AM    Wt Readings from Last 3 Encounters:  05/07/20 162 lb 6.4 oz (73.7 kg)  03/16/20 164 lb 6.4 oz (74.6 kg)  03/15/20 164 lb (74.4 kg)     Objective:    Vital Signs:  BP 123/75   Pulse (!) 58   Ht 5\' 6"  (1.676 m)   Wt 162 lb 6.4 oz (73.7 kg)   SpO2 98%   BMI 26.21 kg/m    VITAL SIGNS:  reviewed  General, strong voice, she is hard of hearing and her daughter assisted with phone call Pulmonary can speak in complete sentences without SOB Neuro A&O X 3  No lower ext edema per her daughter  ASSESSMENT & PLAN:    1. PAF rate is 58 today no awareness of rapid HR.  Stable on current dose of dilt.  2. anticoagulation on coumadin, needs to make appt for INR will have done nextg week. 3. HTN stable today, HCTZ stopped 2020 due to hyponatremia.  4. HLD stable last LDL in March was 50 continue statin. 5. Edema improved on current dose of lasix, no changes will check BMP 6. Recent UTI treated will recheck U/A and send to PCP. 7. PPM follow up with Dr. Lovena Le in Nov it will have been a year for in person visit. Remote checks are stable.   COVID-19 Education: The signs and symptoms of COVID-19 were discussed with the patient and how to seek care for testing (follow up with PCP or arrange E-visit).  The importance of social distancing was discussed today.  Time:   Today, I have spent 20 minutes with the patient with telehealth technology discussing the above problems.  With daughter and compiling information   Medication Adjustments/Labs and Tests Ordered: Current medicines are reviewed at length with the patient today.  Concerns  regarding medicines are outlined above.   Tests Ordered: No orders of the defined types were placed in this encounter.   Medication Changes: No orders of the defined types were placed in this encounter.   Follow Up:  In Person in 6 month(s)  Signed, Cecilie Kicks, NP  05/07/2020 2:40 PM    [facility]

## 2020-05-07 ENCOUNTER — Other Ambulatory Visit: Payer: Self-pay

## 2020-05-07 ENCOUNTER — Encounter: Payer: Self-pay | Admitting: Cardiology

## 2020-05-07 ENCOUNTER — Telehealth (INDEPENDENT_AMBULATORY_CARE_PROVIDER_SITE_OTHER): Payer: Medicare Other | Admitting: Cardiology

## 2020-05-07 VITALS — BP 123/75 | HR 58 | Ht 66.0 in | Wt 162.4 lb

## 2020-05-07 DIAGNOSIS — N390 Urinary tract infection, site not specified: Secondary | ICD-10-CM

## 2020-05-07 DIAGNOSIS — E782 Mixed hyperlipidemia: Secondary | ICD-10-CM

## 2020-05-07 DIAGNOSIS — Z79899 Other long term (current) drug therapy: Secondary | ICD-10-CM | POA: Diagnosis not present

## 2020-05-07 DIAGNOSIS — I480 Paroxysmal atrial fibrillation: Secondary | ICD-10-CM | POA: Diagnosis not present

## 2020-05-07 DIAGNOSIS — R601 Generalized edema: Secondary | ICD-10-CM | POA: Diagnosis not present

## 2020-05-07 NOTE — Patient Instructions (Signed)
Medication Instructions:  Your physician recommends that you continue on your current medications as directed. Please refer to the Current Medication list given to you today.  *If you need a refill on your cardiac medications before your next appointment, please call your pharmacy*   Lab Work: Your physician recommends that you return for lab work on Monday or Tuesday  If you have labs (blood work) drawn today and your tests are completely normal, you will receive your results only by: Marland Kitchen MyChart Message (if you have MyChart) OR . A paper copy in the mail If you have any lab test that is abnormal or we need to change your treatment, we will call you to review the results.   Testing/Procedures: NONE   Follow-Up: At [facility], you and your health needs are our priority.  As part of our continuing mission to provide you with exceptional heart care, we have created designated Provider Care Teams.  These Care Teams include your primary Cardiologist (physician) and Advanced Practice Providers (APPs -  Physician Assistants and Nurse Practitioners) who all work together to provide you with the care you need, when you need it.  We recommend signing up for the patient portal called "MyChart".  Sign up information is provided on this After Visit Summary.  MyChart is used to connect with patients for Virtual Visits (Telemedicine).  Patients are able to view lab/test results, encounter notes, upcoming appointments, etc.  Non-urgent messages can be sent to your provider as well.   To learn more about what you can do with MyChart, go to [email or web address].    Your next appointment:   6 month(s)  The format for your next appointment:   In Person  Provider:   Carlyle Dolly, MD   Other Instructions Thank you for choosing [facility]!

## 2020-05-08 ENCOUNTER — Other Ambulatory Visit: Payer: Self-pay | Admitting: Cardiology

## 2020-05-13 ENCOUNTER — Ambulatory Visit: Payer: Medicare Other | Admitting: Cardiology

## 2020-05-14 DIAGNOSIS — N390 Urinary tract infection, site not specified: Secondary | ICD-10-CM | POA: Diagnosis not present

## 2020-05-14 DIAGNOSIS — Z79899 Other long term (current) drug therapy: Secondary | ICD-10-CM | POA: Diagnosis not present

## 2020-05-15 LAB — BASIC METABOLIC PANEL
BUN/Creatinine Ratio: 13 (ref 12–28)
BUN: 16 mg/dL (ref 10–36)
CO2: 29 mmol/L (ref 20–29)
Calcium: 9.6 mg/dL (ref 8.7–10.3)
Chloride: 96 mmol/L (ref 96–106)
Creatinine, Ser: 1.22 mg/dL — ABNORMAL HIGH (ref 0.57–1.00)
GFR calc Af Amer: 44 mL/min/{1.73_m2} — ABNORMAL LOW (ref >59)
GFR calc non Af Amer: 39 mL/min/{1.73_m2} — ABNORMAL LOW (ref >59)
Glucose: 95 mg/dL (ref 65–99)
Potassium: 3.8 mmol/L (ref 3.5–5.2)
Sodium: 138 mmol/L (ref 134–144)

## 2020-05-15 LAB — URINALYSIS
Bilirubin, UA: NEGATIVE
Glucose, UA: NEGATIVE
Ketones, UA: NEGATIVE
Nitrite, UA: POSITIVE — AB
Protein,UA: NEGATIVE
Specific Gravity, UA: 1.013 (ref 1.005–1.030)
Urobilinogen, Ur: 0.2 mg/dL (ref 0.2–1.0)
pH, UA: 7 (ref 5.0–7.5)

## 2020-05-17 ENCOUNTER — Telehealth: Payer: Self-pay | Admitting: Family Medicine

## 2020-05-17 ENCOUNTER — Other Ambulatory Visit: Payer: Self-pay

## 2020-05-17 ENCOUNTER — Other Ambulatory Visit: Payer: Self-pay | Admitting: Family Medicine

## 2020-05-17 DIAGNOSIS — N3000 Acute cystitis without hematuria: Secondary | ICD-10-CM

## 2020-05-17 MED ORDER — AMPICILLIN 500 MG PO CAPS: 500.0000 mg | ORAL_CAPSULE | Freq: Three times a day (TID) | ORAL | 0 refills | Status: DC

## 2020-05-17 NOTE — Telephone Encounter (Signed)
Has to submit urine bEFORE she takes the first antibiotic tablet, so submit TODAY. I am prescribing antibiotic now, but do not take until the urine is submitted for culture as will likely  affect results so not reliable

## 2020-05-17 NOTE — Telephone Encounter (Signed)
Daughter aware to submit urine for culture. Wants to know if med can be called in today because cardiolody told her she definitely had a UTI

## 2020-05-17 NOTE — Telephone Encounter (Signed)
Ampicillin sent to [facility], fyi

## 2020-05-17 NOTE — Telephone Encounter (Signed)
pls call daughter and let her know I have a message from cardiology re possible UTI, advise her to get urine for c/s and I will treat n based on that , IF she is not already being treated by Urology for this, thanks

## 2020-05-18 DIAGNOSIS — N3000 Acute cystitis without hematuria: Secondary | ICD-10-CM | POA: Diagnosis not present

## 2020-05-18 NOTE — Telephone Encounter (Signed)
LVM for pt daughter to call the office

## 2020-05-19 ENCOUNTER — Telehealth: Payer: Self-pay

## 2020-05-19 NOTE — Telephone Encounter (Signed)
[name] returning a call about pt [facility] lab results.

## 2020-05-22 LAB — URINE CULTURE

## 2020-05-26 NOTE — Telephone Encounter (Signed)
Daughter aware and has reviewed mychart message by dr Moshe Cipro

## 2020-05-27 ENCOUNTER — Ambulatory Visit (INDEPENDENT_AMBULATORY_CARE_PROVIDER_SITE_OTHER): Payer: Medicare Other | Admitting: *Deleted

## 2020-05-27 DIAGNOSIS — I480 Paroxysmal atrial fibrillation: Secondary | ICD-10-CM

## 2020-05-27 DIAGNOSIS — Z5181 Encounter for therapeutic drug level monitoring: Secondary | ICD-10-CM | POA: Diagnosis not present

## 2020-05-27 LAB — POCT INR: INR: 2.5 (ref 2.0–3.0)

## 2020-05-27 NOTE — Patient Instructions (Signed)
Continue warfarin 1/2 tablet daily except 1 tablet on Wednesdays and Saturdays.  Recheck in 6 weeks Keep intake of greens consistent

## 2020-05-28 ENCOUNTER — Other Ambulatory Visit: Payer: Self-pay | Admitting: Family Medicine

## 2020-06-02 DIAGNOSIS — R0902 Hypoxemia: Secondary | ICD-10-CM | POA: Diagnosis not present

## 2020-06-04 ENCOUNTER — Telehealth: Payer: Self-pay | Admitting: Cardiology

## 2020-06-04 NOTE — Telephone Encounter (Signed)
Attempt to reach left message to return call.

## 2020-06-04 NOTE — Telephone Encounter (Signed)
New message     Patient daughter would like call back from nurse - she is concerned about her mothers weight - message left on voicemail for return call

## 2020-06-04 NOTE — Telephone Encounter (Signed)
Per B.Strader,PA-C, Lasix to be increased to 80 mg BID and call back on Monday with patient weight. Daughter agrees with plan.

## 2020-06-04 NOTE — Telephone Encounter (Deleted)
Patient info sent to [facility].Await response from them.

## 2020-06-04 NOTE — Telephone Encounter (Signed)
New message     Daughter called back - pts legs are swollen  , she is still taking 2 fluids in the morning and 1 in the afternoon    165 to 167 to 168 dropped down to 163, back up today to 168 (about 5lb weight gain)

## 2020-06-07 NOTE — Telephone Encounter (Signed)
Daughter [name] called back.patients weight today is 164.2 lbs. She wants to know what dose she should go forward with for Lasix.

## 2020-06-07 NOTE — Telephone Encounter (Signed)
    Given she is close to her baseline, I would recommend doing 80mg  BID for 2 more days then going back to her baseline dosing of 80mg  in AM/40mg  in PM.   Signed, Erma Heritage, PA-C 06/07/2020, 7:19 PM Pager: [phone]

## 2020-06-08 NOTE — Telephone Encounter (Signed)
Returned call to daughter. No answer, left msg to call back.

## 2020-06-09 NOTE — Telephone Encounter (Signed)
I spoke with daughter,Laurie.she agrees to take 2 moe days of lasix 80 mg bid and he reduce back to baseline dosing of 80 mg am and 40 mg pm

## 2020-06-10 DIAGNOSIS — I10 Essential (primary) hypertension: Secondary | ICD-10-CM | POA: Diagnosis not present

## 2020-06-10 DIAGNOSIS — E785 Hyperlipidemia, unspecified: Secondary | ICD-10-CM | POA: Diagnosis not present

## 2020-06-11 LAB — CMP14+EGFR
ALT: 9 IU/L (ref 0–32)
AST: 15 IU/L (ref 0–40)
Albumin/Globulin Ratio: 1.4 (ref 1.2–2.2)
Albumin: 4.1 g/dL (ref 3.5–4.6)
Alkaline Phosphatase: 116 IU/L (ref 44–121)
BUN/Creatinine Ratio: 18 (ref 12–28)
BUN: 23 mg/dL (ref 10–36)
Bilirubin Total: 0.4 mg/dL (ref 0.0–1.2)
CO2: 27 mmol/L (ref 20–29)
Calcium: 9.4 mg/dL (ref 8.7–10.3)
Chloride: 98 mmol/L (ref 96–106)
Creatinine, Ser: 1.29 mg/dL — ABNORMAL HIGH (ref 0.57–1.00)
GFR calc Af Amer: 42 mL/min/{1.73_m2} — ABNORMAL LOW (ref >59)
GFR calc non Af Amer: 36 mL/min/{1.73_m2} — ABNORMAL LOW (ref >59)
Globulin, Total: 2.9 g/dL (ref 1.5–4.5)
Glucose: 105 mg/dL — ABNORMAL HIGH (ref 65–99)
Potassium: 3.8 mmol/L (ref 3.5–5.2)
Sodium: 140 mmol/L (ref 134–144)
Total Protein: 7 g/dL (ref 6.0–8.5)

## 2020-06-11 LAB — CBC
Hematocrit: 34.5 % (ref 34.0–46.6)
Hemoglobin: 11.1 g/dL (ref 11.1–15.9)
MCH: 29.2 pg (ref 26.6–33.0)
MCHC: 32.2 g/dL (ref 31.5–35.7)
MCV: 91 fL (ref 79–97)
Platelets: 311 10*3/uL (ref 150–450)
RBC: 3.8 x10E6/uL (ref 3.77–5.28)
RDW: 12.5 % (ref 11.7–15.4)
WBC: 9.3 10*3/uL (ref 3.4–10.8)

## 2020-06-11 LAB — LIPID PANEL
Chol/HDL Ratio: 2.4 ratio (ref 0.0–4.4)
Cholesterol, Total: 144 mg/dL (ref 100–199)
HDL: 59 mg/dL (ref >39)
LDL Chol Calc (NIH): 65 mg/dL (ref 0–99)
Triglycerides: 111 mg/dL (ref 0–149)
VLDL Cholesterol Cal: 20 mg/dL (ref 5–40)

## 2020-06-11 LAB — TSH: TSH: 1.42 u[IU]/mL (ref 0.450–4.500)

## 2020-06-16 ENCOUNTER — Telehealth: Payer: Self-pay | Admitting: Cardiology

## 2020-06-16 MED ORDER — FUROSEMIDE 40 MG PO TABS: ORAL_TABLET | ORAL | 3 refills | Status: DC

## 2020-06-16 NOTE — Telephone Encounter (Signed)
NEW MESSAGE     Pt c/o swelling: STAT is pt has developed SOB within 24 hours  1) How much weight have you gained and in what time span? 4 DAYS SINCE SHE STOPPED TAKING 80 MG TWICE A DAY   2) If swelling, where is the swelling located? HER FEET , AND THE ONE IS NOW DISCOLORED , AND SHE IS HAVING A HARD TIME MOVING HER FEET BECAUSE THEY ARE SO SWOLLEN   3) Are you currently taking a fluid pill? YES  4) Are you currently SOB? NO  5) Do you have a log of your daily weights (if so, list)? YES   6) Have you gained 3 pounds in a day or 5 pounds in a week? 167 ,, 167 167 166 TODAY  7) Have you traveled recently? NO

## 2020-06-16 NOTE — Telephone Encounter (Signed)
I spoke with the daughter.She will increase Lasix to 80 mg BID until visit on 06/23/20 at 145 pm with M.Lenze, PA-C  Daughter will call me after office visit with Dr.Simpson.

## 2020-06-16 NOTE — Telephone Encounter (Signed)
    I have not met this patient was but was previously asked about her by triage at the end of the day. She is typically followed by Dr. Harl Bowie and had a telehealth visit with Cecilie Kicks, NP in 04/2020. If she is having worsening edema, would recommend increasing her Lasix to $Remove'80mg'RittSWb$  BID again. Creatinine was stable at 1.29 by recent labs on 10/14. Please arrange in office follow-up so we can evaluate her and determine if further testing or medication changes are indicated, especially given her leg discoloration. I believe we have APP availability on 10/27.  Signed, Erma Heritage, PA-C 06/16/2020, 4:10 PM Pager: [phone]

## 2020-06-16 NOTE — Telephone Encounter (Signed)
I spoke with patient's daughter. She reports patient was on lasix 80 mg twice daily. This was decreased to 80 mg in the AM and 40 mg in the PM on June 11, 2020.  Patient is having a great deal of swelling in her feet. Daughter reports feet are difficult for patient to lift due to swelling.  Also notes purple-gray area on top of feet.  More so on right foot than left.  Daughter first noticed this today as patient had her socks off today. Weight was 167 lbs for 3 days prior to today.  Today's weight is 166 lbs.  Will forward to [place], [place] as she has adjusted patient's lasix in last phone note.

## 2020-06-17 ENCOUNTER — Other Ambulatory Visit: Payer: Self-pay

## 2020-06-17 ENCOUNTER — Encounter: Payer: Self-pay | Admitting: Family Medicine

## 2020-06-17 ENCOUNTER — Ambulatory Visit (INDEPENDENT_AMBULATORY_CARE_PROVIDER_SITE_OTHER): Payer: Medicare Other | Admitting: Family Medicine

## 2020-06-17 VITALS — BP 120/80 | HR 118 | Resp 20 | Ht 66.0 in | Wt 166.2 lb

## 2020-06-17 DIAGNOSIS — M7989 Other specified soft tissue disorders: Secondary | ICD-10-CM

## 2020-06-17 DIAGNOSIS — E7849 Other hyperlipidemia: Secondary | ICD-10-CM

## 2020-06-17 DIAGNOSIS — I10 Essential (primary) hypertension: Secondary | ICD-10-CM

## 2020-06-17 DIAGNOSIS — I4891 Unspecified atrial fibrillation: Secondary | ICD-10-CM

## 2020-06-17 DIAGNOSIS — N3001 Acute cystitis with hematuria: Secondary | ICD-10-CM

## 2020-06-17 DIAGNOSIS — E038 Other specified hypothyroidism: Secondary | ICD-10-CM

## 2020-06-17 DIAGNOSIS — Z23 Encounter for immunization: Secondary | ICD-10-CM

## 2020-06-17 DIAGNOSIS — N390 Urinary tract infection, site not specified: Secondary | ICD-10-CM | POA: Diagnosis not present

## 2020-06-17 DIAGNOSIS — Z9181 History of falling: Secondary | ICD-10-CM

## 2020-06-17 LAB — POCT URINALYSIS DIP (CLINITEK)
Bilirubin, UA: NEGATIVE
Glucose, UA: NEGATIVE mg/dL
Ketones, POC UA: NEGATIVE mg/dL
Nitrite, UA: NEGATIVE
POC PROTEIN,UA: NEGATIVE
Spec Grav, UA: 1.02 (ref 1.010–1.025)
Urobilinogen, UA: 0.2 E.U./dL
pH, UA: 6 (ref 5.0–8.0)

## 2020-06-17 MED ORDER — AMPICILLIN 500 MG PO CAPS: 500.0000 mg | ORAL_CAPSULE | Freq: Three times a day (TID) | ORAL | 0 refills | Status: DC

## 2020-06-17 MED ORDER — UNABLE TO FIND: 0 refills | Status: DC

## 2020-06-17 NOTE — Patient Instructions (Addendum)
F/u in office with mD in 4 months, call if you need me sooner  Flu vaccine today  EKG in office today because of initial fast HR.  Your labs are EXCELLENT  Monitor salt an d fluid intake  Continue regime Cardiology has you taking until you see Dr Harl Bowie next week  CCUA in office today, we will send for culture and wait on result before starting antibiotic s to see exactly what needs to be prescribed.   You are referred to Vascular for evaluation of circulation in your legs  PLEASE keep legs elevated when sitting as much as possible  Reduce salt intake   Wear compression hose to help with swelling , prescription is sent to [facility] (15 to 19 mmHg)  Be careful not to fall

## 2020-06-17 NOTE — Assessment & Plan Note (Signed)
Bilateral , right moreso than left Moderate compression hose and refer vascular

## 2020-06-18 ENCOUNTER — Other Ambulatory Visit (HOSPITAL_COMMUNITY)
Admission: RE | Admit: 2020-06-18 | Discharge: 2020-06-18 | Disposition: A | Discharge status: Home / Self Care | Payer: Medicare Other | Source: Other Acute Inpatient Hospital | Attending: Family Medicine | Admitting: Family Medicine

## 2020-06-18 ENCOUNTER — Telehealth: Payer: Self-pay | Admitting: Cardiology

## 2020-06-18 ENCOUNTER — Telehealth: Payer: Self-pay | Admitting: Medical

## 2020-06-18 DIAGNOSIS — N3001 Acute cystitis with hematuria: Secondary | ICD-10-CM | POA: Insufficient documentation

## 2020-06-18 NOTE — Telephone Encounter (Signed)
The patient's daughter called earlier regarding lower leg swelling (see earlier telephone note).  Again daughter wanted to report patient still has swelling in her legs. Lasix had recently been increased to lasix 80mg  BID . Since yesterday weight went down from 167 to 166lbs. Patients O2 is >96%, pulse 70s. No chest pain, fever, chills, N/V, sob. I recommended she continue lasix 80mg  BID over the weekend and continue to take daily weights. Try the compression stockings again. Discussed s/s warranting for ER evaluation. Patient has follow-up next Wednesday. Can call Monday to update Dr. Harl Bowie or suspect the office might call the patient. Will send to Dr. Harl Bowie.   Keana Dueitt Kathlen Mody, PA-C

## 2020-06-18 NOTE — Telephone Encounter (Signed)
NEW MESSAGE    Daughter called has concerned, her mothers feet are more swollen today than they were before wearing the compression socks . Thinks there may be a slight discoloration to her skin, please call to advise is this normal

## 2020-06-18 NOTE — Telephone Encounter (Signed)
Spoke with the patient's daughter who states that the patient's feet have become more swollen. They went this morning and got compression hose that she has been wearing all day. She states that the patient's feet have gotten worse since wearing the hose. Patient's feet are also discolored. She states that patient's weight is 166. She started Lasix 80 mg BID yesterday.  Daughter is very emotional and concerned about her mother.  They went to PCP yesterday and patient's HR was increased. The daughter thinks that the patient was just nervous.  She would like to know whether Dr. Harl Bowie thinks that she should take her mom to the ER.  She states BP is good, O2 96% and HR in the 80's. She states that she is not having any increased SOB. I advised that I would reach out to Dr. Harl Bowie, however if patient's develops any new or worsening symptoms before her visit with Ermalinda Barrios on 10/26 then she should be evaluated in ER. Patient's daughter verbalized understanding.

## 2020-06-20 ENCOUNTER — Encounter: Payer: Self-pay | Admitting: Family Medicine

## 2020-06-20 LAB — URINE CULTURE: Culture: >=100000 — AB

## 2020-06-20 NOTE — Assessment & Plan Note (Signed)
Hyperlipidemia:Low fat diet discussed and encouraged.   Lipid Panel  Lab Results  Component Value Date   CHOL 144 06/10/2020   HDL 59 06/10/2020   LDLCALC 65 06/10/2020   TRIG 111 06/10/2020   CHOLHDL 2.4 06/10/2020   Controlled, no change in medication

## 2020-06-20 NOTE — Assessment & Plan Note (Addendum)
Reports recent episodes of palpitations, and initially tachycardic, EKG: a fib rate 89, frequent PVC. will message Cardiology who she ha appt with next week Advised ED if palpitations or light headedness  Persist or become very severe

## 2020-06-20 NOTE — Assessment & Plan Note (Signed)
Symptomatic and abn UA,ampicillin prescribed and await c/s

## 2020-06-20 NOTE — Assessment & Plan Note (Signed)
Controlled, no change in medication  

## 2020-06-20 NOTE — Assessment & Plan Note (Signed)
Home safety and fall risk reduction discussed 

## 2020-06-20 NOTE — Progress Notes (Signed)
   [name]     MRN: [id number]      DOB: 1926/02/12   HPI [name] is here for follow up and re-evaluation of chronic medical conditions, medication management and review of any available recent lab and radiology data.  Preventive health is updated, specifically  Cancer screening and Immunization.   C/o increased fatigue,  Leg swelling and intermittent palpitations in recent t  times C/o malodorous urine, has not  Been able to keep urology appt because of illness in the family ROS Denies recent fever or chills. Denies sinus pressure, nasal congestion, ear pain or sore throat. Denies chest congestion, productive cough or wheezing. Denies chest pains, palpitations and leg swelling Denies abdominal pain, nausea, vomiting,diarrhea or constipation.    C/o joint pain, swelling and limitation in mobility. Denies headaches, seizures, numbness, or tingling. Denies depression, anxiety or insomnia. Denies skin break down or rash.   PE  BP 120/80   Pulse (!) 118   Resp 20   Ht 5\' 6"  (1.676 m)   Wt 166 lb 3.2 oz (75.4 kg)   SpO2 98%   BMI 26.83 kg/m   Patient alert and oriented and in no cardiopulmonary distress.  HEENT: No facial asymmetry, EOMI,     Neck decreased ROM.  Chest: Clear to auscultation bilaterally.  CVS: S1, S2 no murmurs, no S3.Regular rate.  ABD: Soft non tender.   CEY:EMVVK  Edema bilaterally, with venous distension and stasis in both feet around the ankles  MS: Decreased  ROM spine, shoulders, hips and knees.  Skin: Intact, no ulcerations or rash noted.  Psych: Good eye contact, normal affect. Memory intact not anxious or depressed appearing.  CNS: CN 2-12 intact, power,  normal throughout.no focal deficits noted.   Assessment & Plan Leg swelling Bilateral , right moreso than left Moderate compression hose and refer vascular  Atrial fibrillation with RVR (HCC) Reports recent episodes of palpitations, and initially tachycardic, EKG: a fib rate  89, frequent PVC. will message Cardiology who she ha appt with next week Advised ED if palpitations or light headedness  Persist or become very severe  Recurrent UTI Symptomatic and abn UA,ampicillin prescribed and await c/s  At high risk for falls Home safety and fall risk reduction discussed  Hypothyroidism Controlled, no change in medication   Hyperlipidemia Hyperlipidemia:Low fat diet discussed and encouraged.   Lipid Panel  Lab Results  Component Value Date   CHOL 144 06/10/2020   HDL 59 06/10/2020   LDLCALC 65 06/10/2020   TRIG 111 06/10/2020   CHOLHDL 2.4 06/10/2020   Controlled, no change in medication

## 2020-06-21 NOTE — Telephone Encounter (Signed)
Weights are coming dow well, I would continue the increased lasix dose until her follow up Wed. As weights come down O2sats may improve. It may be the low O2 sats causing the heart rates to go up as opposed to the other way around first step is get the fluid off and see how things look then.  Zandra Abts MD

## 2020-06-21 NOTE — Telephone Encounter (Signed)
Daughter [name] notified and voiced understanding

## 2020-06-21 NOTE — Telephone Encounter (Signed)
Spoke with daughter who called to update. She states that pt has tried to place compression socks and again her feet became swollen. She reports HR's from 59-136. And O2 sats from 89-99%. Daughter states that when her HR is up her O2 sat decreases. Pt has decreased fluid intake. Current weights are as follows. Please advise.  10/22 165.4 lb 10/23 163.6 lb 10/24 158.8 lb 10/25 159.8lb

## 2020-06-22 NOTE — Progress Notes (Signed)
Cardiology Office Note    Date:  07/86/7544   ID:  EDUARDO WURTH, DOB [birth year], MRN [id number]  PCP:  Fayrene Helper, MD  Cardiologist: Carlyle Dolly, MD EPS: None  Chief Complaint  Patient presents with  . Leg Swelling    History of Present Illness:  [name] is a 86 y.o. female with history of CAF on Coumadin(declined NOACs), chronic diastolic CHF(HCTZ stopped due to hyponatremia) and on lasix, St. Jude PPM for CHF, HTN, HLD.  Family called in with worsening edema and lasix incresed 80 mg bid. Daughter here and other daughter on facetime.  Daughter says she is going in/out Afib 70-170/m. O2 sats dropping in low 90's. Lasix increased to 80 mg bid. Edema worse as the days go on but didn't tolerate compression hose. Has seen Dr. Moshe Cipro for this and referred to vascular. Breathing is fine. No chest pain. Patient without complaints but daughters concerned. Also has had ongoing UTI-followed by urology and gynecology. They want a test to make sure it's not in her blood system.    Past Medical History:  Diagnosis Date  . Annual physical exam 04/22/2015  . Anxiety   . Arthritis   . Arthritis   . Asthma   . Atrial fibrillation ([place])   . Bleeding disorder ([place])   . Gastroesophageal reflux disease   . Hearing impairment   . Heart disease   . Heart murmur   . High cholesterol   . Hyperlipidemia   . Hypertension   . Hypothyroidism   . Impaired glucose tolerance   . Macular degeneration   . Pancreatitis   . Paroxysmal atrial fibrillation ([place]) 2011  . Sick sinus syndrome ([place]) 2011   Atrial fibrilation 10/2009; and dual chamber pacemaker in 4/11; normal EF  . Sleep apnea    Nocturnal oxygen therapy  . Zoster 2016    Past Surgical History:  Procedure Laterality Date  . ABDOMINAL HYSTERECTOMY    . CATARACT EXTRACTION     Bilateral  . COLONOSCOPY  2009   Dr. Gala Romney  . EYE SURGERY     laser   . PACEMAKER INSERTION  2011   dual chamber   . PPM GENERATOR  CHANGEOUT N/A 11/13/2016   Procedure: PPM Generator Changeout;  Surgeon: Evans Lance, MD;  Location: [facility];  Service: Cardiovascular;  Laterality: N/A;  . SALPINGOOPHORECTOMY  1970   right    Current Medications: Current Meds  Medication Sig  . Ascorbic Acid (VITAMIN C PO) Take 1 tablet by mouth daily.  . busPIRone (BUSPAR) 5 MG tablet TAKE 1 TABLET BY MOUTH TWICE DAILY  . Calcium Carb-Cholecalciferol 500-400 MG-UNIT CHEW Chew 1 tablet by mouth daily.   . Cholecalciferol (VITAMIN D3 PO) Take 2 tablets by mouth daily.  . Cyanocobalamin (VITAMIN B12 PO) Take 1 tablet by mouth daily.  Marland Kitchen diltiazem (CARDIZEM CD) 240 MG 24 hr capsule Take 1 capsule (240 mg total) by mouth daily.  Marland Kitchen donepezil (ARICEPT) 5 MG tablet TAKE (1) TABLET BY MOUTH AT BEDTIME.  . fluticasone (FLONASE) 50 MCG/ACT nasal spray Place 1 spray into both nostrils daily.  . furosemide (LASIX) 40 MG tablet 06/16/20 Take 80 mg (2 Tablets)  Twice a day  . levothyroxine (SYNTHROID) 112 MCG tablet TAKE 1 TABLET BY MOUTH ONCE DAILY.  Marland Kitchen loratadine (CLARITIN) 10 MG tablet Take 1 tablet (10 mg total) by mouth daily.  Marland Kitchen lovastatin (MEVACOR) 40 MG tablet TAKE ONE TABLET BY MOUTH AT BEDTIME.  Marland Kitchen  meclizine (ANTIVERT) 12.5 MG tablet Take 1 tablet (12.5 mg total) by mouth 2 (two) times daily as needed for dizziness.  . metoprolol tartrate (LOPRESSOR) 100 MG tablet TAKE 1 TABLET BY MOUTH TWICE A DAY.  . Multiple Vitamins-Minerals (EYE VITAMINS & MINERALS PO) Take 1 tablet by mouth daily.  Marland Kitchen omeprazole (PRILOSEC) 40 MG capsule TAKE (1) CAPSULE BY MOUTH ONCE DAILY FOR ACID REFLUX.  Marland Kitchen OXYGEN-HELIUM IN Inhale 2 L into the lungs at bedtime.  Marland Kitchen UNABLE TO FIND Compression hose 15-19 mmhg 1 pair DX leg swelling  . warfarin (COUMADIN) 5 MG tablet TAKE 1/2 TABLET BY MOUTH DAILY EXCEPT 1 TABLET ON WEDNESDAYS & SATURDAYS OR AS DIRECTED.  Marland Kitchen zinc gluconate 50 MG tablet Take 50 mg by mouth daily.   Current Facility-Administered Medications  for the 06/23/20 encounter (Office Visit) with Imogene Burn, PA-C  Medication  . miconazole (MONISTAT 7) 2 % vaginal cream 1 Applicatorful     Allergies:   Patient has no known allergies.   Social History   Socioeconomic History  . Marital status: Widowed    Spouse name: Not on file  . Number of children: 7  . Years of education: college  . Highest education level: Not on file  Occupational History  . Occupation: retired     Fish farm manager: RETIRED  Tobacco Use  . Smoking status: Never Smoker  . Smokeless tobacco: Never Used  Vaping Use  . Vaping Use: Never used  Substance and Sexual Activity  . Alcohol use: No    Alcohol/week: 0.0 standard drinks  . Drug use: No  . Sexual activity: Not Currently    Birth control/protection: Surgical    Comment: hyst  Other Topics Concern  . Not on file  Social History Narrative  . Not on file   Social Determinants of Health   Financial Resource Strain: Low Risk   . Difficulty of Paying Living Expenses: Not hard at all  Food Insecurity: No Food Insecurity  . Worried About Charity fundraiser in the Last Year: Never true  . Ran Out of Food in the Last Year: Never true  Transportation Needs: No Transportation Needs  . Lack of Transportation (Medical): No  . Lack of Transportation (Non-Medical): No  Physical Activity: Inactive  . Days of Exercise per Week: 0 days  . Minutes of Exercise per Session: 0 min  Stress: Stress Concern Present  . Feeling of Stress : To some extent  Social Connections: Socially Isolated  . Frequency of Communication with Friends and Family: Once a week  . Frequency of Social Gatherings with Friends and Family: More than three times a week  . Attends Religious Services: Never  . Active Member of Clubs or Organizations: No  . Attends Archivist Meetings: Never  . Marital Status: Widowed     Family History:  The patient's  family history includes Arthritis in an other family member; Asthma in an  other family member; Cancer in her father and mother; Emphysema in her sister; Heart disease in an other family member; Lung disease in an other family member; Melanoma in her son; Mitral valve prolapse in her sister; Pneumonia in her sister.   ROS:   Please see the history of present illness.    ROS All other systems reviewed and are negative.   PHYSICAL EXAM:   VS:  BP 118/74   Pulse 77   Ht 5' 6"  (1.676 m)   Wt 161 lb 12.8 oz (73.4 kg)  SpO2 98%   BMI 26.12 kg/m   Physical Exam  GEN: Well nourished, well developed, in no acute distress  Neck: no JVD, carotid bruits, or masses Cardiac:irreg irreg  Respiratory:  clear to auscultation bilaterally, normal work of breathing GI: soft, nontender, nondistended, + BS Ext: without cyanosis, clubbing, or edema, decreased distal pulses bilaterally. Some varicosities Neuro:  Alert and Oriented x 3 Psych: euthymic mood, full affect  Wt Readings from Last 3 Encounters:  06/23/20 161 lb 12.8 oz (73.4 kg)  06/17/20 166 lb 3.2 oz (75.4 kg)  05/07/20 162 lb 6.4 oz (73.7 kg)      Studies/Labs Reviewed:   EKG:  EKG is not ordered today.   Recent Labs: 07/03/2019: B Natriuretic Peptide 232.0 03/12/2020: Magnesium 2.0 06/10/2020: ALT 9; BUN 23; Creatinine, Ser 1.29; Hemoglobin 11.1; Platelets 311; Potassium 3.8; Sodium 140; TSH 1.420   Lipid Panel    Component Value Date/Time   CHOL 144 06/10/2020 1103   TRIG 111 06/10/2020 1103   HDL 59 06/10/2020 1103   CHOLHDL 2.4 06/10/2020 1103   CHOLHDL 2.1 11/04/2019 1027   VLDL 53 (H) 11/22/2016 1005   LDLCALC 65 06/10/2020 1103   LDLCALC 50 11/04/2019 1027    Additional studies/ records that were reviewed today include:  Echo 07/04/19  IMPRESSIONS     1. Left ventricular ejection fraction, by visual estimation, is 65 to  70%. The left ventricle has normal function. There is mildly increased  left ventricular hypertrophy.   2. Left ventricular diastolic parameters are indeterminate in  the setting  of atrial fibrillation.   3. Global right ventricle has normal systolic function.The right  ventricular size is mildly enlarged. No increase in right ventricular wall  thickness.   4. Left atrial size was upper normal.   5. Right atrial size was upper normal.   6. Mild mitral annular calcification.   7. Moderate aortic valve annular calcification.   8. The mitral valve is grossly normal. Mild mitral valve regurgitation.   9. The tricuspid valve is grossly normal. Tricuspid valve regurgitation  is trivial.  10. The aortic valve is tricuspid. Aortic valve regurgitation is not  visualized. Mild aortic valve sclerosis without stenosis.  11. The pulmonic valve was grossly normal. Pulmonic valve regurgitation is  trivial.  12. A pacer wire is visualized in the RV and RA.  13. The inferior vena cava is normal in size with greater than 50%  respiratory variability, suggesting right atrial pressure of 3 mmHg.  14. Mildly elevated pulmonary artery systolic pressure.  15. The tricuspid regurgitant velocity is 2.71 m/s, and with an assumed  right atrial pressure of 3 mmHg, the estimated right ventricular systolic  pressure is mildly elevated at 32.4 mmHg.   FINDINGS   Left Ventricle: Left ventricular ejection fraction, by visual estimation,  is 65 to 70%. The left ventricle has normal function. The left ventricular  internal cavity size was the left ventricle is normal in size. There is  mildly increased left  ventricular hypertrophy. Left ventricular diastolic parameters are  indeterminate.   Right Ventricle: The right ventricular size is mildly enlarged. No  increase in right ventricular wall thickness. Global RV systolic function  is has normal systolic function. The tricuspid regurgitant velocity is  2.71 m/s, and with an assumed right atrial   pressure of 3 mmHg, the estimated right ventricular systolic pressure is  mildly elevated at 32.4 mmHg.   Left Atrium: Left atrial  size was upper normal.  Right Atrium: Right atrial size was upper normal   Pericardium: There is no evidence of pericardial effusion.   Mitral Valve: The mitral valve is grossly normal. Mild mitral annular  calcification. Mild mitral valve regurgitation, with posteriorly-directed  jet.   Tricuspid Valve: The tricuspid valve is grossly normal. Tricuspid valve  regurgitation is trivial.   Aortic Valve: The aortic valve is tricuspid. Aortic valve regurgitation is  not visualized. Mild aortic valve sclerosis is present, with no evidence  of aortic valve stenosis. Moderate aortic valve annular calcification.   Pulmonic Valve: The pulmonic valve was grossly normal. Pulmonic valve  regurgitation is trivial.   Aorta: The aortic root is normal in size and structure.   Venous: The inferior vena cava is normal in size with greater than 50%  respiratory variability, suggesting right atrial pressure of 3 mmHg.   IAS/Shunts: No atrial level shunt detected by color flow Doppler.        ASSESSMENT:    1. Acute on chronic diastolic CHF (congestive heart failure) (HCC)   2. Paroxysmal atrial fibrillation ([place])   3. Essential hypertension   4. Hyperlipidemia, unspecified hyperlipidemia type   5. Cardiac pacemaker in situ      PLAN:  In order of problems listed above:   chronic diastolic CHF, echo 48/2500 LVEF 65-70%, worsening edema and Lasix increased 80 mg BID-no evidence of edema on exam today.  Suspect her edema recently is related to her A. fib with fast rates.  She is on high doses of diltiazem and metoprolol.  Will check be met and BNP.  Most likely need to decrease Lasix but will wait and see what renal function is.  Update 2D echo  PAF on Coumadin and diltiazem 240 mg daily and metoprolol 100 mg twice daily.  Will increase metoprolol to 125 mg twice daily.  She has follow-up with Dr. Lovena Le in November.  Suspect she may need an antiarrhythmic for better A. fib control or pacer  adjustment.  HTN blood pressure well controlled  HLD on Mevacor  PPM followed by Dr. Lovena Le    Medication Adjustments/Labs and Tests Ordered: Current medicines are reviewed at length with the patient today.  Concerns regarding medicines are outlined above.  Medication changes, Labs and Tests ordered today are listed in the Patient Instructions below. There are no Patient Instructions on file for this visit.   Sumner Boast, PA-C  06/23/2020 2:48 PM    [facility] [address], [place], [place]  [address] Phone: [phone]; Fax: [phone]

## 2020-06-23 ENCOUNTER — Other Ambulatory Visit: Payer: Self-pay

## 2020-06-23 ENCOUNTER — Ambulatory Visit: Payer: Medicare Other | Admitting: Physician Assistant

## 2020-06-23 ENCOUNTER — Encounter: Payer: Self-pay | Admitting: Physician Assistant

## 2020-06-23 VITALS — BP 118/74 | HR 77 | Ht 66.0 in | Wt 161.8 lb

## 2020-06-23 DIAGNOSIS — I5033 Acute on chronic diastolic (congestive) heart failure: Secondary | ICD-10-CM | POA: Diagnosis not present

## 2020-06-23 DIAGNOSIS — Z950 Presence of cardiac pacemaker: Secondary | ICD-10-CM | POA: Diagnosis not present

## 2020-06-23 DIAGNOSIS — I10 Essential (primary) hypertension: Secondary | ICD-10-CM

## 2020-06-23 DIAGNOSIS — E785 Hyperlipidemia, unspecified: Secondary | ICD-10-CM

## 2020-06-23 DIAGNOSIS — I480 Paroxysmal atrial fibrillation: Secondary | ICD-10-CM | POA: Diagnosis not present

## 2020-06-23 MED ORDER — METOPROLOL TARTRATE 25 MG PO TABS: 25.0000 mg | ORAL_TABLET | Freq: Two times a day (BID) | ORAL | 3 refills | Status: DC

## 2020-06-23 NOTE — Patient Instructions (Addendum)
Medication Instructions:  Your physician has recommended you make the following change in your medication:  START: Metoprol 25mg  twice a day with the 100mg   *If you need a refill on your cardiac medications before your next appointment, please call your pharmacy*   Lab Work: BMET, CBC, BNP  If you have labs (blood work) drawn today and your tests are completely normal, you will receive your results only by: Marland Kitchen MyChart Message (if you have MyChart) OR . A paper copy in the mail If you have any lab test that is abnormal or we need to change your treatment, we will call you to review the results  Testing:  Your physician has requested that you have an echocardiogram. Echocardiography is a painless test that uses sound waves to create images of your heart. It provides your doctor with information about the size and shape of your heart and how well your heart's chambers and valves are working. This procedure takes approximately one hour. There are no restrictions for this procedure.   Follow-Up: At [facility], you and your health needs are our priority.  As part of our continuing mission to provide you with exceptional heart care, we have created designated Provider Care Teams.  These Care Teams include your primary Cardiologist (physician) and Advanced Practice Providers (APPs -  Physician Assistants and Nurse Practitioners) who all work together to provide you with the care you need, when you need it.  We recommend signing up for the patient portal called "MyChart".  Sign up information is provided on this After Visit Summary.  MyChart is used to connect with patients for Virtual Visits (Telemedicine).  Patients are able to view lab/test results, encounter notes, upcoming appointments, etc.  Non-urgent messages can be sent to your provider as well.   To learn more about what you can do with MyChart, go to [email or web address].    Your next appointment:   Next Available  The format for  your next appointment:   In Person  Provider:   Carlyle Dolly, MD

## 2020-06-24 ENCOUNTER — Telehealth: Payer: Self-pay | Admitting: Cardiology

## 2020-06-24 DIAGNOSIS — Z950 Presence of cardiac pacemaker: Secondary | ICD-10-CM | POA: Diagnosis not present

## 2020-06-24 DIAGNOSIS — I5033 Acute on chronic diastolic (congestive) heart failure: Secondary | ICD-10-CM | POA: Diagnosis not present

## 2020-06-24 DIAGNOSIS — I480 Paroxysmal atrial fibrillation: Secondary | ICD-10-CM | POA: Diagnosis not present

## 2020-06-24 DIAGNOSIS — I10 Essential (primary) hypertension: Secondary | ICD-10-CM | POA: Diagnosis not present

## 2020-06-24 DIAGNOSIS — E785 Hyperlipidemia, unspecified: Secondary | ICD-10-CM | POA: Diagnosis not present

## 2020-06-24 NOTE — Telephone Encounter (Signed)
Spoke with daughter added her to [facility] schedule for June 30, 2020, daughter is very concerned about adding additional medication without talking to Dr Harl Bowie because he told her they should not go higher than 100mg  2 times a day.    Daughter concerned about her mother having a UTI for over a year, and she wants to have blood work to see if its made it to her blood stream.  She was told no they wouldn't order the test because the patient wasn't running a fever. She was told to keep her mother away from salt and to watch eating out because of the sodium.

## 2020-06-24 NOTE — Telephone Encounter (Signed)
Patient's daughter, [name], would like to speak with Dr. Harl Bowie, "not [place]", about her mother's medication change from 06-23-2020 and about her diagnosis.  Please call Ms. Wagoner.

## 2020-06-24 NOTE — Telephone Encounter (Signed)
Daughter and patient actually saw M.Lenze, PA-C, not [place], PA-C    M.Lenze ordered additional lopressor 25 mg BID in addition to the 100 mg BID she is already taking.   Daughter also mentioned the AVS said patient had CHF and provider told her she didn't

## 2020-06-25 LAB — BASIC METABOLIC PANEL
BUN/Creatinine Ratio: 22 (ref 12–28)
BUN: 26 mg/dL (ref 10–36)
CO2: 28 mmol/L (ref 20–29)
Calcium: 9.4 mg/dL (ref 8.7–10.3)
Chloride: 99 mmol/L (ref 96–106)
Creatinine, Ser: 1.2 mg/dL — ABNORMAL HIGH (ref 0.57–1.00)
GFR calc Af Amer: 45 mL/min/{1.73_m2} — ABNORMAL LOW (ref >59)
GFR calc non Af Amer: 39 mL/min/{1.73_m2} — ABNORMAL LOW (ref >59)
Glucose: 79 mg/dL (ref 65–99)
Potassium: 4.2 mmol/L (ref 3.5–5.2)
Sodium: 141 mmol/L (ref 134–144)

## 2020-06-25 LAB — CBC
Hematocrit: 33.1 % — ABNORMAL LOW (ref 34.0–46.6)
Hemoglobin: 10.9 g/dL — ABNORMAL LOW (ref 11.1–15.9)
MCH: 29.6 pg (ref 26.6–33.0)
MCHC: 32.9 g/dL (ref 31.5–35.7)
MCV: 90 fL (ref 79–97)
Platelets: 355 10*3/uL (ref 150–450)
RBC: 3.68 x10E6/uL — ABNORMAL LOW (ref 3.77–5.28)
RDW: 12.3 % (ref 11.7–15.4)
WBC: 9.3 10*3/uL (ref 3.4–10.8)

## 2020-06-25 LAB — PRO B NATRIURETIC PEPTIDE: NT-Pro BNP: 2779 pg/mL — ABNORMAL HIGH (ref 0–738)

## 2020-06-25 NOTE — Telephone Encounter (Signed)
Patient has apt 11/3 in the [facility] office with Dr.Branch

## 2020-06-25 NOTE — Telephone Encounter (Signed)
Will discuss at our appt next week, anything regarding a UTI would need to be addressed by Sherrill Raring MD

## 2020-06-28 ENCOUNTER — Other Ambulatory Visit: Payer: Self-pay | Admitting: Family Medicine

## 2020-06-30 ENCOUNTER — Ambulatory Visit: Payer: Medicare Other | Admitting: Cardiology

## 2020-06-30 ENCOUNTER — Telehealth: Payer: Self-pay | Admitting: *Deleted

## 2020-06-30 ENCOUNTER — Ambulatory Visit: Payer: Medicare Other | Admitting: Diagnostic Neuroimaging

## 2020-06-30 ENCOUNTER — Encounter: Payer: Self-pay | Admitting: Cardiology

## 2020-06-30 VITALS — BP 118/64 | HR 71 | Ht 66.0 in | Wt 164.8 lb

## 2020-06-30 DIAGNOSIS — R600 Localized edema: Secondary | ICD-10-CM | POA: Diagnosis not present

## 2020-06-30 DIAGNOSIS — I4891 Unspecified atrial fibrillation: Secondary | ICD-10-CM | POA: Diagnosis not present

## 2020-06-30 MED ORDER — TORSEMIDE 20 MG PO TABS: 40.0000 mg | ORAL_TABLET | Freq: Two times a day (BID) | ORAL | 3 refills | Status: DC

## 2020-06-30 NOTE — Progress Notes (Signed)
Clinical Summary [name] is a 86 y.o.female seen today for follow up of the following medical problems   1. Paroxysmal afib -Has not been interested in NOACs.   - admission 06/2019 with afib with RVR. Was on dilt gtt, converted to oral dilt 120mg  daily and discharged - in general patient has not had any symptoms. The tachycardia was noted by a home vitals check prior to admission with rates 130s - some recurrent asymptomatic tachycardia noted at home, patient brought to ER yesterday. Cardizem increased to 180mg , discharged from ER.   - occasional palpitations. Infrequent - no bleeding on coumadin.    - some recent high HRs, lopressor was increased to 125mg  bid at last visit with PA Lenze.  - chronic palpitations - did not take higher dose of lopressor as of yet, remains on 100mg  bid. .      2. Symptomatic bradycardia with pacemaker - followed by EP  3. HTN - compliant with meds    4. Hyperlipidemia - 02/2018 TC 132 HDL 42 TG 195 LDL 63 -she is compliant with statin 10/2019 TC 132 HDL 63 TG 105 LDL 50  5. Edema - 06/24/20 labs Cr 1.2 BUN 26 BNP 2779  - repeat echo pending tomorrow  - weight as high 169 lbs, lowest 159 lbs. Home 160.8 lbs - lasix 80mg  bid. Breathing is up and down.  - prior weights as low as 153 lbs.     SH: daughter of [name] who is also a patient of mine. She has a great grandsone here with her today started kindergarten, a great grandaughter who is 74 yos  Has not had covid vaccine.     Past Medical History:  Diagnosis Date  . Annual physical exam 04/22/2015  . Anxiety   . Arthritis   . Arthritis   . Asthma   . Atrial fibrillation ([place])   . Bleeding disorder ([place])   . Gastroesophageal reflux disease   . Hearing impairment   . Heart disease   . Heart murmur   . High cholesterol   . Hyperlipidemia   . Hypertension   . Hypothyroidism   . Impaired glucose tolerance   . Macular degeneration   .  Pancreatitis   . Paroxysmal atrial fibrillation ([place]) 2011  . Sick sinus syndrome ([place]) 2011   Atrial fibrilation 10/2009; and dual chamber pacemaker in 4/11; normal EF  . Sleep apnea    Nocturnal oxygen therapy  . Zoster 2016     No Known Allergies   Current Outpatient Medications  Medication Sig Dispense Refill  . Ascorbic Acid (VITAMIN C PO) Take 1 tablet by mouth daily.    . busPIRone (BUSPAR) 5 MG tablet TAKE 1 TABLET BY MOUTH TWICE DAILY 60 tablet 0  . Calcium Carb-Cholecalciferol 500-400 MG-UNIT CHEW Chew 1 tablet by mouth daily.  60 tablet   . Cholecalciferol (VITAMIN D3 PO) Take 2 tablets by mouth daily.    . Cyanocobalamin (VITAMIN B12 PO) Take 1 tablet by mouth daily.    Marland Kitchen diltiazem (CARDIZEM CD) 240 MG 24 hr capsule Take 1 capsule (240 mg total) by mouth daily. 30 capsule 11  . donepezil (ARICEPT) 5 MG tablet TAKE (1) TABLET BY MOUTH AT BEDTIME. 90 tablet 1  . fluticasone (FLONASE) 50 MCG/ACT nasal spray Place 1 spray into both nostrils daily.    . furosemide (LASIX) 40 MG tablet 06/16/20 Take 80 mg (2 Tablets)  Twice a day 270 tablet 3  . levothyroxine (SYNTHROID)  112 MCG tablet TAKE 1 TABLET BY MOUTH ONCE DAILY. 90 tablet 0  . loratadine (CLARITIN) 10 MG tablet Take 1 tablet (10 mg total) by mouth daily. 30 tablet 1  . lovastatin (MEVACOR) 40 MG tablet TAKE ONE TABLET BY MOUTH AT BEDTIME. 90 tablet 3  . meclizine (ANTIVERT) 12.5 MG tablet Take 1 tablet (12.5 mg total) by mouth 2 (two) times daily as needed for dizziness. 30 tablet 0  . metoprolol tartrate (LOPRESSOR) 100 MG tablet TAKE 1 TABLET BY MOUTH TWICE A DAY. 180 tablet 2  . metoprolol tartrate (LOPRESSOR) 25 MG tablet Take 1 tablet (25 mg total) by mouth 2 (two) times daily. Take this in addition to the 100mg  twice a day 180 tablet 3  . Multiple Vitamins-Minerals (EYE VITAMINS & MINERALS PO) Take 1 tablet by mouth daily.    Marland Kitchen omeprazole (PRILOSEC) 40 MG capsule TAKE (1) CAPSULE BY MOUTH ONCE DAILY FOR ACID REFLUX.  30 capsule 5  . OXYGEN-HELIUM IN Inhale 2 L into the lungs at bedtime.    Marland Kitchen UNABLE TO FIND Compression hose 15-19 mmhg 1 pair DX leg swelling 1 each 0  . warfarin (COUMADIN) 5 MG tablet TAKE 1/2 TABLET BY MOUTH DAILY EXCEPT 1 TABLET ON WEDNESDAYS & SATURDAYS OR AS DIRECTED. 25 tablet 6  . zinc gluconate 50 MG tablet Take 50 mg by mouth daily.     Current Facility-Administered Medications  Medication Dose Route Frequency Provider Last Rate Last Admin  . miconazole (MONISTAT 7) 2 % vaginal cream 1 Applicatorful  1 Applicatorful Vaginal QHS Jonnie Kind, MD         Past Surgical History:  Procedure Laterality Date  . ABDOMINAL HYSTERECTOMY    . CATARACT EXTRACTION     Bilateral  . COLONOSCOPY  2009   Dr. Gala Romney  . EYE SURGERY     laser   . PACEMAKER INSERTION  2011   dual chamber   . PPM GENERATOR CHANGEOUT N/A 11/13/2016   Procedure: PPM Generator Changeout;  Surgeon: Evans Lance, MD;  Location: [facility];  Service: Cardiovascular;  Laterality: N/A;  . SALPINGOOPHORECTOMY  1970   right     No Known Allergies    Family History  Problem Relation Age of Onset  . Cancer Mother        gential  . Cancer Father        throat   . Pneumonia Sister   . Emphysema Sister   . Mitral valve prolapse Sister   . Heart disease Other   . Arthritis Other   . Lung disease Other   . Asthma Other   . Melanoma Son      Social History [name] reports that she has never smoked. She has never used smokeless tobacco. [name] reports no history of alcohol use.   Review of Systems CONSTITUTIONAL: No weight loss, fever, chills, weakness or fatigue.  HEENT: Eyes: No visual loss, blurred vision, double vision or yellow sclerae.No hearing loss, sneezing, congestion, runny nose or sore throat.  SKIN: No rash or itching.  CARDIOVASCULAR: per hpi RESPIRATORY: No shortness of breath, cough or sputum.  GASTROINTESTINAL: No anorexia, nausea, vomiting or diarrhea. No abdominal  pain or blood.  GENITOURINARY: No burning on urination, no polyuria NEUROLOGICAL: No headache, dizziness, syncope, paralysis, ataxia, numbness or tingling in the extremities. No change in bowel or bladder control.  MUSCULOSKELETAL: No muscle, back pain, joint pain or stiffness.  LYMPHATICS: No enlarged nodes. No history of  splenectomy.  PSYCHIATRIC: No history of depression or anxiety.  ENDOCRINOLOGIC: No reports of sweating, cold or heat intolerance. No polyuria or polydipsia.  Marland Kitchen   Physical Examination Today's Vitals   06/30/20 1004  BP: 118/64  Pulse: 71  SpO2: 97%  Weight: 164 lb 12.8 oz (74.8 kg)  Height: 5\' 6"  (1.676 m)   Body mass index is 26.6 kg/m.  Gen: resting comfortably, no acute distress HEENT: no scleral icterus, pupils equal round and reactive, no palptable cervical adenopathy,  JQ:ZESPQ, no mr/g, no jvd Resp: Clear to auscultation bilaterally GI: abdomen is soft, non-tender, non-distended, normal bowel sounds, no hepatosplenomegaly MSK: extremities are warm, 1+ bialteral LE edema Skin: warm, no rash Neuro:  no focal deficits Psych: appropriate affect   Diagnostic Studies  07/04/19 echo IMPRESSIONS   1. Left ventricular ejection fraction, by visual estimation, is 65 to 70%. The left ventricle has normal function. There is mildly increased left ventricular hypertrophy. 2. Left ventricular diastolic parameters are indeterminate in the setting of atrial fibrillation. 3. Global right ventricle has normal systolic function.The right ventricular size is mildly enlarged. No increase in right ventricular wall thickness. 4. Left atrial size was upper normal. 5. Right atrial size was upper normal. 6. Mild mitral annular calcification. 7. Moderate aortic valve annular calcification. 8. The mitral valve is grossly normal. Mild mitral valve regurgitation. 9. The tricuspid valve is grossly normal. Tricuspid valve regurgitation is trivial. 10. The aortic  valve is tricuspid. Aortic valve regurgitation is not visualized. Mild aortic valve sclerosis without stenosis. 11. The pulmonic valve was grossly normal. Pulmonic valve regurgitation is trivial. 12. A pacer wire is visualized in the RV and RA. 13. The inferior vena cava is normal in size with greater than 50% respiratory variability, suggesting right atrial pressure of 3 mmHg. 14. Mildly elevated pulmonary artery systolic pressure. 15. The tricuspid regurgitant velocity is 2.71 m/s, and with an assumed right atrial pressure of 3 mmHg, the estimated right ventricular systolic pressure is mildly elevated at 32.4 mmHg.   Assessment and Plan  1. Afib - siome recent high rates at home - pending echo may need to adjust meds. If LVEF has dropped will need to come off diltiazem, possibly higher doses of beta blocker. If stable LVEF continue dilt, uptitrate beta blocker.  - if drop in LVEF ask for device check to check afib and rate burden as possible cause  2. LE edema - increased edema, weight gain. Significantly elevated BNP - d/c lasix, start torsemide 40mg  bid with BMET/Mg in 2 weeks - echo pending for tomorrow, would suspect some change in her cardiac function given other data.       Arnoldo Lenis, M.D.

## 2020-06-30 NOTE — Telephone Encounter (Signed)
Patient was no show for new patient appointment today. 

## 2020-06-30 NOTE — Patient Instructions (Signed)
Your physician recommends that you schedule a follow-up appointment in: [place] has recommended you make the following change in your medication:   STOP LASIX   START TORSEMIDE 40 MG TWICE DAILY   Your physician recommends that you return for lab work in: 2 WEEKS BMP/MG  Thank you for choosing [facility]!!

## 2020-07-01 ENCOUNTER — Encounter: Payer: Self-pay | Admitting: Diagnostic Neuroimaging

## 2020-07-01 ENCOUNTER — Other Ambulatory Visit: Payer: Self-pay

## 2020-07-01 ENCOUNTER — Ambulatory Visit (HOSPITAL_COMMUNITY)
Admission: RE | Admit: 2020-07-01 | Discharge: 2020-07-01 | Disposition: A | Discharge status: Home / Self Care | Payer: Medicare Other | Source: Ambulatory Visit | Attending: Cardiology | Admitting: Cardiology

## 2020-07-01 DIAGNOSIS — I480 Paroxysmal atrial fibrillation: Secondary | ICD-10-CM

## 2020-07-01 DIAGNOSIS — I5033 Acute on chronic diastolic (congestive) heart failure: Secondary | ICD-10-CM

## 2020-07-01 LAB — ECHOCARDIOGRAM COMPLETE
AR max vel: 1.56 cm2
AV Area VTI: 1.51 cm2
AV Area mean vel: 1.38 cm2
AV Mean grad: 6.7 mmHg
AV Peak grad: 11.6 mmHg
Ao pk vel: 1.7 m/s
Area-P 1/2: 2.62 cm2
MV M vel: 4.29 m/s
MV Peak grad: 73.6 mmHg
S' Lateral: 2.44 cm

## 2020-07-01 NOTE — Progress Notes (Signed)
*  PRELIMINARY RESULTS* Echocardiogram 2D Echocardiogram has been performed.  [name] 07/01/2020, 2:51 PM

## 2020-07-02 ENCOUNTER — Telehealth: Payer: Self-pay | Admitting: *Deleted

## 2020-07-02 NOTE — Telephone Encounter (Signed)
Daughter notified 

## 2020-07-02 NOTE — Telephone Encounter (Signed)
-----   Message from Arnoldo Lenis, MD sent at 07/02/2020  3:46 PM EDT ----- Echo fortunately shows no new weakness of the heart. She has the long term changes of heart muscle stiffness that she has had before that's causing the swelling. I would go ahead and increase her lopressor to the 125mg  bid that was discussed.   Zandra Abts MD

## 2020-07-03 DIAGNOSIS — R0902 Hypoxemia: Secondary | ICD-10-CM | POA: Diagnosis not present

## 2020-07-06 ENCOUNTER — Other Ambulatory Visit: Payer: Self-pay

## 2020-07-06 ENCOUNTER — Ambulatory Visit (INDEPENDENT_AMBULATORY_CARE_PROVIDER_SITE_OTHER): Payer: Medicare Other

## 2020-07-06 VITALS — BP 129/78 | HR 82 | Temp 97.1°F | Resp 18 | Ht 66.0 in | Wt 156.2 lb

## 2020-07-06 DIAGNOSIS — Z0000 Encounter for general adult medical examination without abnormal findings: Secondary | ICD-10-CM

## 2020-07-06 NOTE — Progress Notes (Signed)
Subjective:   [name] is a 86 y.o. female who presents for Medicare Annual (Subsequent) preventive examination.        Objective:    There were no vitals filed for this visit. There is no height or weight on file to calculate BMI.  Advanced Directives 07/08/2019 07/04/2019 07/03/2019 11/13/2016 10/18/2016 02/09/2015  Does Patient Have a Medical Advance Directive? No No No No No Yes  Type of Advance Directive - - - - - Living will  Would patient like information on creating a medical advance directive? No - Patient declined No - Patient declined No - Patient declined No - Patient declined (No Data) -    Current Medications (verified) Outpatient Encounter Medications as of 07/06/2020  Medication Sig  . Ascorbic Acid (VITAMIN C PO) Take 1 tablet by mouth daily.  . busPIRone (BUSPAR) 5 MG tablet TAKE 1 TABLET BY MOUTH TWICE DAILY  . Calcium Carb-Cholecalciferol 500-400 MG-UNIT CHEW Chew 1 tablet by mouth daily.   . Cholecalciferol (VITAMIN D3 PO) Take 2 tablets by mouth daily.  . Cyanocobalamin (VITAMIN B12 PO) Take 1 tablet by mouth daily.  Marland Kitchen diltiazem (CARDIZEM CD) 240 MG 24 hr capsule Take 1 capsule (240 mg total) by mouth daily.  Marland Kitchen donepezil (ARICEPT) 5 MG tablet TAKE (1) TABLET BY MOUTH AT BEDTIME.  . fluticasone (FLONASE) 50 MCG/ACT nasal spray Place 1 spray into both nostrils daily.  Marland Kitchen levothyroxine (SYNTHROID) 112 MCG tablet TAKE 1 TABLET BY MOUTH ONCE DAILY.  Marland Kitchen loratadine (CLARITIN) 10 MG tablet Take 1 tablet (10 mg total) by mouth daily.  Marland Kitchen lovastatin (MEVACOR) 40 MG tablet TAKE ONE TABLET BY MOUTH AT BEDTIME.  . meclizine (ANTIVERT) 12.5 MG tablet Take 1 tablet (12.5 mg total) by mouth 2 (two) times daily as needed for dizziness.  . metoprolol tartrate (LOPRESSOR) 100 MG tablet TAKE 1 TABLET BY MOUTH TWICE A DAY.  . metoprolol tartrate (LOPRESSOR) 25 MG tablet Take 1 tablet (25 mg total) by mouth 2 (two) times daily. Take this in addition to the 100mg  twice a day  (Patient not taking: Reported on 06/30/2020)  . Multiple Vitamins-Minerals (EYE VITAMINS & MINERALS PO) Take 1 tablet by mouth daily.  Marland Kitchen omeprazole (PRILOSEC) 40 MG capsule TAKE (1) CAPSULE BY MOUTH ONCE DAILY FOR ACID REFLUX.  Marland Kitchen OXYGEN-HELIUM IN Inhale 2 L into the lungs at bedtime.  . torsemide (DEMADEX) 20 MG tablet Take 2 tablets (40 mg total) by mouth 2 (two) times daily.  Marland Kitchen UNABLE TO FIND Compression hose 15-19 mmhg 1 pair DX leg swelling  . warfarin (COUMADIN) 5 MG tablet TAKE 1/2 TABLET BY MOUTH DAILY EXCEPT 1 TABLET ON WEDNESDAYS & SATURDAYS OR AS DIRECTED.  Marland Kitchen zinc gluconate 50 MG tablet Take 50 mg by mouth daily.  . [DISCONTINUED] benzonatate (TESSALON PERLES) 100 MG capsule Take 1 capsule (100 mg total) by mouth every 6 (six) hours as needed for cough.   Facility-Administered Encounter Medications as of 07/06/2020  Medication  . miconazole (MONISTAT 7) 2 % vaginal cream 1 Applicatorful    Allergies (verified) Patient has no known allergies.   History: Past Medical History:  Diagnosis Date  . Annual physical exam 04/22/2015  . Anxiety   . Arthritis   . Arthritis   . Asthma   . Atrial fibrillation ([place])   . Bleeding disorder ([place])   . Gastroesophageal reflux disease   . Hearing impairment   . Heart disease   . Heart murmur   . High  cholesterol   . Hyperlipidemia   . Hypertension   . Hypothyroidism   . Impaired glucose tolerance   . Macular degeneration   . Pancreatitis   . Paroxysmal atrial fibrillation ([place]) 2011  . Sick sinus syndrome ([place]) 2011   Atrial fibrilation 10/2009; and dual chamber pacemaker in 4/11; normal EF  . Sleep apnea    Nocturnal oxygen therapy  . Zoster 2016   Past Surgical History:  Procedure Laterality Date  . ABDOMINAL HYSTERECTOMY    . CATARACT EXTRACTION     Bilateral  . COLONOSCOPY  2009   Dr. Gala Romney  . EYE SURGERY     laser   . PACEMAKER INSERTION  2011   dual chamber   . PPM GENERATOR CHANGEOUT N/A 11/13/2016   Procedure: PPM  Generator Changeout;  Surgeon: Evans Lance, MD;  Location: [place] [facility];  Service: Cardiovascular;  Laterality: N/A;  . SALPINGOOPHORECTOMY  1970   right   Family History  Problem Relation Age of Onset  . Cancer Mother        gential  . Cancer Father        throat   . Pneumonia Sister   . Emphysema Sister   . Mitral valve prolapse Sister   . Heart disease Other   . Arthritis Other   . Lung disease Other   . Asthma Other   . Melanoma Son    Social History   Socioeconomic History  . Marital status: Widowed    Spouse name: Not on file  . Number of children: 7  . Years of education: college  . Highest education level: Not on file  Occupational History  . Occupation: retired     Fish farm manager: RETIRED  Tobacco Use  . Smoking status: Never Smoker  . Smokeless tobacco: Never Used  Vaping Use  . Vaping Use: Never used  Substance and Sexual Activity  . Alcohol use: No    Alcohol/week: 0.0 standard drinks  . Drug use: No  . Sexual activity: Not Currently    Birth control/protection: Surgical    Comment: hyst  Other Topics Concern  . Not on file  Social History Narrative  . Not on file   Social Determinants of Health   Financial Resource Strain: Low Risk   . Difficulty of Paying Living Expenses: Not hard at all  Food Insecurity: No Food Insecurity  . Worried About Charity fundraiser in the Last Year: Never true  . Ran Out of Food in the Last Year: Never true  Transportation Needs: No Transportation Needs  . Lack of Transportation (Medical): No  . Lack of Transportation (Non-Medical): No  Physical Activity: Inactive  . Days of Exercise per Week: 0 days  . Minutes of Exercise per Session: 0 min  Stress: Stress Concern Present  . Feeling of Stress : To some extent  Social Connections: Socially Isolated  . Frequency of Communication with Friends and Family: Once a week  . Frequency of Social Gatherings with Friends and Family: More than three times a week  .  Attends Religious Services: Never  . Active Member of Clubs or Organizations: No  . Attends Archivist Meetings: Never  . Marital Status: Widowed    Tobacco Counseling Counseling given: Not Answered                  Diabetic? No          Activities of Daily Living No flowsheet data found.  Patient  Care Team: Fayrene Helper, MD as PCP - General (Family Medicine) Harl Bowie, Alphonse Guild, MD as PCP - Cardiology (Cardiology) Evans Lance, MD (Cardiology) Gala Romney Cristopher Estimable, MD as Attending Physician (Gastroenterology) Rothbart, Cristopher Estimable, MD (Inactive) as Attending Physician (Cardiology) Hayden Pedro, MD as Attending Physician (Ophthalmology) Sinda Du, MD as Attending Physician (Pulmonary Disease)  Indicate any recent Medical Services you may have received from other than [facility] providers in the past year (date may be approximate).     Assessment:   This is a routine wellness examination for Emillie.  Hearing/Vision screen No exam data present  Dietary issues and exercise activities discussed:    Goals    .  DIET - EAT MORE FRUITS AND VEGETABLES    .  Increase physical activity      Patient would like to be able to increase her physical activity so she can be more active with her great grandchildren.    Marland Kitchen  LIFESTYLE - DECREASE FALLS RISK (pt-stated)      Depression Screen PHQ 2/9 Scores 06/17/2020 02/25/2020 02/09/2020 12/15/2019 08/14/2019 07/02/2019 08/08/2018  PHQ - 2 Score 1 0 1 0 0 0 4  PHQ- 9 Score - - 6 - - - 9    Fall Risk Fall Risk  06/17/2020 03/16/2020 02/25/2020 12/15/2019 11/13/2019  Falls in the past year? 1 1 1  0 1  Number falls in past yr: 1 1 1  0 1  Injury with Fall? 1 1 1  0 1  Risk Factor Category  - - - - -  Risk for fall due to : - - - - Impaired balance/gait;Impaired mobility  Follow up - - - Falls evaluation completed -  Comment - - - - -    Any stairs in or around the home? No  If so, are there any without  handrails? No  Home free of loose throw rugs in walkways, pet beds, electrical cords, etc? Yes  Adequate lighting in your home to reduce risk of falls? Yes   ASSISTIVE DEVICES UTILIZED TO PREVENT FALLS:  Life alert? No  Use of a cane, walker or w/c? Yes  Grab bars in the bathroom? No  Shower chair or bench in shower? No  Elevated toilet seat or a handicapped toilet? No   TIMED UP AND GO:  Was the test performed? Yes .  Length of time to ambulate 10 feet: 30 sec.   Gait slow and steady with assistive device  Cognitive Function: MMSE - Mini Mental State Exam 11/13/2019 09/24/2019  Orientation to time 5 5  Orientation to Place 5 5  Registration 3 3  Attention/ Calculation 5 5  Recall 3 3  Language- name 2 objects 2 2  Language- repeat 1 1  Language- follow 3 step command 3 3  Language- read & follow direction 1 1  Write a sentence 1 1  Copy design 0 0  Total score 29 29     6CIT Screen 06/17/2018 10/18/2016  What Year? 0 points 0 points  What month? 0 points 0 points  What time? 0 points 0 points  Count back from 20 2 points 0 points  Months in reverse 0 points 0 points  Repeat phrase 0 points 0 points  Total Score 2 0    Immunizations Immunization History  Administered Date(s) Administered  . Fluad Quad(high Dose 65+) 05/15/2019, 06/17/2020  . H1N1 07/01/2008  . Influenza Split 06/14/2012  . Influenza Whole 06/22/2006, 05/27/2008, 06/10/2009, 06/09/2010, 05/10/2011  . Influenza,inj,Quad PF,6+  Mos 06/09/2013, 08/12/2014, 07/15/2015, 04/19/2016, 07/04/2017, 06/17/2018  . Pneumococcal Conjugate-13 03/18/2014  . Pneumococcal Polysaccharide-23 06/09/2008  . Tdap 04/22/2015    TDAP status: Up to date Flu Vaccine status: Up to date Pneumococcal vaccine status: Up to date   Covid-19 vaccine status: Declined, Education has been provided regarding the importance of this vaccine but patient still declined. Advised may receive this vaccine at local pharmacy or Health  Dept.or vaccine clinic. Aware to provide a copy of the vaccination record if obtained from local pharmacy or Health Dept. Verbalized acceptance and understanding.  Qualifies for Shingles Vaccine? Yes   Zostavax completed No   Shingrix Completed?: No.    Education has been provided regarding the importance of this vaccine. Patient has been advised to call insurance company to determine out of pocket expense if they have not yet received this vaccine. Advised may also receive vaccine at local pharmacy or Health Dept. Verbalized acceptance and understanding.  Screening Tests Health Maintenance  Topic Date Due  . COVID-19 Vaccine (1) Never done  . TETANUS/TDAP  04/21/2025  . INFLUENZA VACCINE  Completed  . DEXA SCAN  Completed  . PNA vac Low Risk Adult  Completed    Health Maintenance  Health Maintenance Due  Topic Date Due  . COVID-19 Vaccine (1) Never done    Colorectal cancer screening: No longer required.  Mammogram status: No longer required.  Bone Density: Completed.   Lung Cancer Screening: (Low Dose CT Chest recommended if Age 70-80 years, 30 pack-year currently smoking OR have quit w/in 15years.) does not qualify.    Additional Screening:  Hepatitis C Screening: does qualify; Completed.   Vision Screening: Recommended annual ophthalmology exams for early detection of glaucoma and other disorders of the eye. Is the patient up to date with their annual eye exam?  Yes    Who is the provider or what is the name of the office in which the patient attends annual eye exams? Dr. Zigmund Daniel  If pt is not established with a provider, would they like to be referred to a provider to establish care? No .   Dental Screening: Recommended annual dental exams for proper oral hygiene  Community Resource Referral / Chronic Care Management: CRR required this visit?  No   CCM required this visit?  No      Plan:     I have personally reviewed and noted the following in the patient's  chart:   . Medical and social history . Use of alcohol, tobacco or illicit drugs  . Current medications and supplements . Functional ability and status . Nutritional status . Physical activity . Advanced directives . List of other physicians . Hospitalizations, surgeries, and ER visits in previous 12 months . Vitals . Screenings to include cognitive, depression, and falls . Referrals and appointments  In addition, I have reviewed and discussed with patient certain preventive protocols, quality metrics, and best practice recommendations. A written personalized care plan for preventive services as well as general preventive health recommendations were provided to patient.     Lonn Georgia, LPN   [id number]   Nurse Notes: AWV conducted face-to-face in the office, with provider in the office as well. Pt consent to conduct AWV was given. Visit took approx 20 min. Pt requested information on Advanced Directives.

## 2020-07-06 NOTE — Patient Instructions (Addendum)
[name] , Thank you for taking time to come for your Medicare Wellness Visit. I appreciate your ongoing commitment to your health goals. Please review the following plan we discussed and let me know if I can assist you in the future.   Screening recommendations/referrals: Colonoscopy: No longer required   Mammogram: No longer required  Bone Density: Complete   Recommended yearly ophthalmology/optometry visit for glaucoma screening and checkup Recommended yearly dental visit for hygiene and checkup  Vaccinations: Influenza vaccine: Complete  Pneumococcal vaccine: Complete  Tdap vaccine: Complete  Shingles vaccine: Education provided.     Advanced directives: Information given at visit.   Conditions/risks identified: NONE   Next appointment: 10/21/20 @ 11:00am with Dr. Moshe Cipro    Preventive Care 65 Years and Older, Female Preventive care refers to lifestyle choices and visits with your health care provider that can promote health and wellness. What does preventive care include?  A yearly physical exam. This is also called an annual well check.  Dental exams once or twice a year.  Routine eye exams. Ask your health care provider how often you should have your eyes checked.  Personal lifestyle choices, including:  Daily care of your teeth and gums.  Regular physical activity.  Eating a healthy diet.  Avoiding tobacco and drug use.  Limiting alcohol use.  Practicing safe sex.  Taking low-dose aspirin every day.  Taking vitamin and mineral supplements as recommended by your health care provider. What happens during an annual well check? The services and screenings done by your health care provider during your annual well check will depend on your age, overall health, lifestyle risk factors, and family history of disease. Counseling  Your health care provider may ask you questions about your:  Alcohol use.  Tobacco use.  Drug use.  Emotional well-being.  Home  and relationship well-being.  Sexual activity.  Eating habits.  History of falls.  Memory and ability to understand (cognition).  Work and work Statistician.  Reproductive health. Screening  You may have the following tests or measurements:  Height, weight, and BMI.  Blood pressure.  Lipid and cholesterol levels. These may be checked every 5 years, or more frequently if you are over 41 years old.  Skin check.  Lung cancer screening. You may have this screening every year starting at age 3 if you have a 30-pack-year history of smoking and currently smoke or have quit within the past 15 years.  Fecal occult blood test (FOBT) of the stool. You may have this test every year starting at age 45.  Flexible sigmoidoscopy or colonoscopy. You may have a sigmoidoscopy every 5 years or a colonoscopy every 10 years starting at age 47.  Hepatitis C blood test.  Hepatitis B blood test.  Sexually transmitted disease (STD) testing.  Diabetes screening. This is done by checking your blood sugar (glucose) after you have not eaten for a while (fasting). You may have this done every 1-3 years.  Bone density scan. This is done to screen for osteoporosis. You may have this done starting at age 47.  Mammogram. This may be done every 1-2 years. Talk to your health care provider about how often you should have regular mammograms. Talk with your health care provider about your test results, treatment options, and if necessary, the need for more tests. Vaccines  Your health care provider may recommend certain vaccines, such as:  Influenza vaccine. This is recommended every year.  Tetanus, diphtheria, and acellular pertussis (Tdap, Td) vaccine. You  may need a Td booster every 10 years.  Zoster vaccine. You may need this after age 6.  Pneumococcal 13-valent conjugate (PCV13) vaccine. One dose is recommended after age 86.  Pneumococcal polysaccharide (PPSV23) vaccine. One dose is recommended  after age 97. Talk to your health care provider about which screenings and vaccines you need and how often you need them. This information is not intended to replace advice given to you by your health care provider. Make sure you discuss any questions you have with your health care provider. Document Released: 09/10/2015 Document Revised: 05/03/2016 Document Reviewed: 06/15/2015 Elsevier Interactive Patient Education  2017 [employer] Prevention in the Home Falls can cause injuries. They can happen to people of all ages. There are many things you can do to make your home safe and to help prevent falls. What can I do on the outside of my home?  Regularly fix the edges of walkways and driveways and fix any cracks.  Remove anything that might make you trip as you walk through a door, such as a raised step or threshold.  Trim any bushes or trees on the path to your home.  Use bright outdoor lighting.  Clear any walking paths of anything that might make someone trip, such as rocks or tools.  Regularly check to see if handrails are loose or broken. Make sure that both sides of any steps have handrails.  Any raised decks and porches should have guardrails on the edges.  Have any leaves, snow, or ice cleared regularly.  Use sand or salt on walking paths during winter.  Clean up any spills in your garage right away. This includes oil or grease spills. What can I do in the bathroom?  Use night lights.  Install grab bars by the toilet and in the tub and shower. Do not use towel bars as grab bars.  Use non-skid mats or decals in the tub or shower.  If you need to sit down in the shower, use a plastic, non-slip stool.  Keep the floor dry. Clean up any water that spills on the floor as soon as it happens.  Remove soap buildup in the tub or shower regularly.  Attach bath mats securely with double-sided non-slip rug tape.  Do not have throw rugs and other things on the floor  that can make you trip. What can I do in the bedroom?  Use night lights.  Make sure that you have a light by your bed that is easy to reach.  Do not use any sheets or blankets that are too big for your bed. They should not hang down onto the floor.  Have a firm chair that has side arms. You can use this for support while you get dressed.  Do not have throw rugs and other things on the floor that can make you trip. What can I do in the kitchen?  Clean up any spills right away.  Avoid walking on wet floors.  Keep items that you use a lot in easy-to-reach places.  If you need to reach something above you, use a strong step stool that has a grab bar.  Keep electrical cords out of the way.  Do not use floor polish or wax that makes floors slippery. If you must use wax, use non-skid floor wax.  Do not have throw rugs and other things on the floor that can make you trip. What can I do with my stairs?  Do not leave any items  on the stairs.  Make sure that there are handrails on both sides of the stairs and use them. Fix handrails that are broken or loose. Make sure that handrails are as long as the stairways.  Check any carpeting to make sure that it is firmly attached to the stairs. Fix any carpet that is loose or worn.  Avoid having throw rugs at the top or bottom of the stairs. If you do have throw rugs, attach them to the floor with carpet tape.  Make sure that you have a light switch at the top of the stairs and the bottom of the stairs. If you do not have them, ask someone to add them for you. What else can I do to help prevent falls?  Wear shoes that:  Do not have high heels.  Have rubber bottoms.  Are comfortable and fit you well.  Are closed at the toe. Do not wear sandals.  If you use a stepladder:  Make sure that it is fully opened. Do not climb a closed stepladder.  Make sure that both sides of the stepladder are locked into place.  Ask someone to hold it  for you, if possible.  Clearly mark and make sure that you can see:  Any grab bars or handrails.  First and last steps.  Where the edge of each step is.  Use tools that help you move around (mobility aids) if they are needed. These include:  Canes.  Walkers.  Scooters.  Crutches.  Turn on the lights when you go into a dark area. Replace any light bulbs as soon as they burn out.  Set up your furniture so you have a clear path. Avoid moving your furniture around.  If any of your floors are uneven, fix them.  If there are any pets around you, be aware of where they are.  Review your medicines with your doctor. Some medicines can make you feel dizzy. This can increase your chance of falling. Ask your doctor what other things that you can do to help prevent falls. This information is not intended to replace advice given to you by your health care provider. Make sure you discuss any questions you have with your health care provider. Document Released: 06/10/2009 Document Revised: 01/20/2016 Document Reviewed: 09/18/2014 Elsevier Interactive Patient Education  2017 [employer].

## 2020-07-08 ENCOUNTER — Ambulatory Visit (INDEPENDENT_AMBULATORY_CARE_PROVIDER_SITE_OTHER): Payer: Medicare Other | Admitting: *Deleted

## 2020-07-08 ENCOUNTER — Other Ambulatory Visit: Payer: Self-pay

## 2020-07-08 DIAGNOSIS — Z5181 Encounter for therapeutic drug level monitoring: Secondary | ICD-10-CM

## 2020-07-08 DIAGNOSIS — I480 Paroxysmal atrial fibrillation: Secondary | ICD-10-CM | POA: Diagnosis not present

## 2020-07-08 DIAGNOSIS — M7989 Other specified soft tissue disorders: Secondary | ICD-10-CM

## 2020-07-08 LAB — POCT INR: INR: 4 — AB (ref 2.0–3.0)

## 2020-07-08 NOTE — Patient Instructions (Signed)
Hold warfarin tonight then resume 1/2 tablet daily except 1 tablet on Wednesdays and Saturdays.  Recheck in 2 weeks Increase greens

## 2020-07-14 DIAGNOSIS — I4891 Unspecified atrial fibrillation: Secondary | ICD-10-CM | POA: Diagnosis not present

## 2020-07-14 NOTE — Addendum Note (Signed)
Addended by: Levonne Hubert on: 07/14/2020 12:08 PM   Modules accepted: Orders

## 2020-07-15 LAB — BASIC METABOLIC PANEL
BUN/Creatinine Ratio: 19 (ref 12–28)
BUN: 30 mg/dL (ref 10–36)
CO2: 29 mmol/L (ref 20–29)
Calcium: 9.9 mg/dL (ref 8.7–10.3)
Chloride: 97 mmol/L (ref 96–106)
Creatinine, Ser: 1.61 mg/dL — ABNORMAL HIGH (ref 0.57–1.00)
GFR calc Af Amer: 32 mL/min/{1.73_m2} — ABNORMAL LOW (ref >59)
GFR calc non Af Amer: 28 mL/min/{1.73_m2} — ABNORMAL LOW (ref >59)
Glucose: 92 mg/dL (ref 65–99)
Potassium: 4.6 mmol/L (ref 3.5–5.2)
Sodium: 140 mmol/L (ref 134–144)

## 2020-07-15 LAB — MAGNESIUM: Magnesium: 2.2 mg/dL (ref 1.6–2.3)

## 2020-07-16 ENCOUNTER — Encounter: Payer: Self-pay | Admitting: Internal Medicine

## 2020-07-16 ENCOUNTER — Ambulatory Visit (INDEPENDENT_AMBULATORY_CARE_PROVIDER_SITE_OTHER): Payer: Medicare Other | Admitting: Internal Medicine

## 2020-07-16 ENCOUNTER — Other Ambulatory Visit: Payer: Self-pay

## 2020-07-16 VITALS — BP 114/68 | HR 74 | Ht 66.0 in | Wt 157.6 lb

## 2020-07-16 DIAGNOSIS — Z950 Presence of cardiac pacemaker: Secondary | ICD-10-CM | POA: Diagnosis not present

## 2020-07-16 DIAGNOSIS — I495 Sick sinus syndrome: Secondary | ICD-10-CM

## 2020-07-16 LAB — CUP PACEART INCLINIC DEVICE CHECK
Battery Remaining Longevity: 114 mo
Battery Voltage: 3.01 V
Brady Statistic RA Percent Paced: 0 %
Brady Statistic RV Percent Paced: 73 %
Date Time Interrogation Session: 20211119125507
Implantable Lead Implant Date: 20110422
Implantable Lead Implant Date: 20110422
Implantable Lead Location: 753859
Implantable Lead Location: 753860
Implantable Pulse Generator Implant Date: 20180319
Lead Channel Impedance Value: 425 Ohm
Lead Channel Pacing Threshold Amplitude: 1 V
Lead Channel Pacing Threshold Pulse Width: 0.5 ms
Lead Channel Sensing Intrinsic Amplitude: 4.5 mV
Lead Channel Setting Pacing Amplitude: 2 V
Lead Channel Setting Pacing Pulse Width: 0.5 ms
Lead Channel Setting Sensing Sensitivity: 1 mV
Pulse Gen Model: 2272
Pulse Gen Serial Number: 8010902

## 2020-07-16 NOTE — Patient Instructions (Signed)
Medication Instructions:  Your physician recommends that you continue on your current medications as directed. Please refer to the Current Medication list given to you today.  *If you need a refill on your cardiac medications before your next appointment, please call your pharmacy*   Lab Work: NONE   If you have labs (blood work) drawn today and your tests are completely normal, you will receive your results only by: . MyChart Message (if you have MyChart) OR . A paper copy in the mail If you have any lab test that is abnormal or we need to change your treatment, we will call you to review the results.   Testing/Procedures: NONE    Follow-Up: At [facility], you and your health needs are our priority.  As part of our continuing mission to provide you with exceptional heart care, we have created designated Provider Care Teams.  These Care Teams include your primary Cardiologist (physician) and Advanced Practice Providers (APPs -  Physician Assistants and Nurse Practitioners) who all work together to provide you with the care you need, when you need it.  We recommend signing up for the patient portal called "MyChart".  Sign up information is provided on this After Visit Summary.  MyChart is used to connect with patients for Virtual Visits (Telemedicine).  Patients are able to view lab/test results, encounter notes, upcoming appointments, etc.  Non-urgent messages can be sent to your provider as well.   To learn more about what you can do with MyChart, go to [email or web address].    Your next appointment:   1 year(s)  The format for your next appointment:   In Person  Provider:   Gregg Taylor, MD   Other Instructions Thank you for choosing [facility]!    

## 2020-07-16 NOTE — Progress Notes (Signed)
HPI Mrs. Wickersham returns today for followup. She is a pleasant 86 yo woman with chronic atrial fib, diastolic heart failure, s/p brady, s/p PPM insertion. She has been using a walker and her falls have improved. She denies syncope or loss of consciousness. She denies chest pain or sob. She is pending vascular eval. She has some venous insufficiency.  No Known Allergies   Current Outpatient Medications  Medication Sig Dispense Refill   Ascorbic Acid (VITAMIN C PO) Take 1 tablet by mouth daily.     busPIRone (BUSPAR) 5 MG tablet TAKE 1 TABLET BY MOUTH TWICE DAILY 60 tablet 0   Calcium Carb-Cholecalciferol 500-400 MG-UNIT CHEW Chew 1 tablet by mouth daily.  60 tablet    Cholecalciferol (VITAMIN D3 PO) Take 2 tablets by mouth daily.     Cyanocobalamin (VITAMIN B12 PO) Take 1 tablet by mouth daily.     diltiazem (CARDIZEM CD) 240 MG 24 hr capsule Take 1 capsule (240 mg total) by mouth daily. 30 capsule 11   donepezil (ARICEPT) 5 MG tablet TAKE (1) TABLET BY MOUTH AT BEDTIME. 90 tablet 1   fluticasone (FLONASE) 50 MCG/ACT nasal spray Place 1 spray into both nostrils daily.     levothyroxine (SYNTHROID) 112 MCG tablet TAKE 1 TABLET BY MOUTH ONCE DAILY. 90 tablet 0   loratadine (CLARITIN) 10 MG tablet Take 1 tablet (10 mg total) by mouth daily. 30 tablet 1   lovastatin (MEVACOR) 40 MG tablet TAKE ONE TABLET BY MOUTH AT BEDTIME. 90 tablet 3   meclizine (ANTIVERT) 12.5 MG tablet Take 1 tablet (12.5 mg total) by mouth 2 (two) times daily as needed for dizziness. 30 tablet 0   metoprolol tartrate (LOPRESSOR) 100 MG tablet TAKE 1 TABLET BY MOUTH TWICE A DAY. 180 tablet 2   metoprolol tartrate (LOPRESSOR) 25 MG tablet Take 1 tablet (25 mg total) by mouth 2 (two) times daily. Take this in addition to the 100mg  twice a day 180 tablet 3   Multiple Vitamins-Minerals (EYE VITAMINS & MINERALS PO) Take 1 tablet by mouth daily.     omeprazole (PRILOSEC) 40 MG capsule TAKE (1) CAPSULE BY MOUTH  ONCE DAILY FOR ACID REFLUX. 30 capsule 5   OXYGEN-HELIUM IN Inhale 2 L into the lungs at bedtime.     torsemide (DEMADEX) 20 MG tablet Take 2 tablets (40 mg total) by mouth 2 (two) times daily. 120 tablet 3   UNABLE TO FIND Compression hose 15-19 mmhg 1 pair DX leg swelling 1 each 0   warfarin (COUMADIN) 5 MG tablet TAKE 1/2 TABLET BY MOUTH DAILY EXCEPT 1 TABLET ON WEDNESDAYS & SATURDAYS OR AS DIRECTED. 25 tablet 6   zinc gluconate 50 MG tablet Take 50 mg by mouth daily.     Current Facility-Administered Medications  Medication Dose Route Frequency Provider Last Rate Last Admin   miconazole (MONISTAT 7) 2 % vaginal cream 1 Applicatorful  1 Applicatorful Vaginal QHS Jonnie Kind, MD         Past Medical History:  Diagnosis Date   Annual physical exam 04/22/2015   Anxiety    Arthritis    Arthritis    Asthma    Atrial fibrillation (HCC)    Bleeding disorder (HCC)    Gastroesophageal reflux disease    Hearing impairment    Heart disease    Heart murmur    High cholesterol    Hyperlipidemia    Hypertension    Hypothyroidism    Impaired glucose tolerance  Macular degeneration    Pancreatitis    Paroxysmal atrial fibrillation ([place]) 2011   Sick sinus syndrome [facility]) 2011   Atrial fibrilation 10/2009; and dual chamber pacemaker in 4/11; normal EF   Sleep apnea    Nocturnal oxygen therapy   Zoster 2016    ROS:   All systems reviewed and negative except as noted in the HPI.   Past Surgical History:  Procedure Laterality Date   ABDOMINAL HYSTERECTOMY     CATARACT EXTRACTION     Bilateral   COLONOSCOPY  2009   Dr. Gala Romney   EYE SURGERY     laser    PACEMAKER INSERTION  2011   dual chamber    PPM GENERATOR CHANGEOUT N/A 11/13/2016   Procedure: PPM Generator Changeout;  Surgeon: Evans Lance, MD;  Location: [place] [facility];  Service: Cardiovascular;  Laterality: N/A;   SALPINGOOPHORECTOMY  1970   right     Family History   Problem Relation Age of Onset   Cancer Mother        gential   Cancer Father        throat    Pneumonia Sister    Emphysema Sister    Mitral valve prolapse Sister    Heart disease Other    Arthritis Other    Lung disease Other    Asthma Other    Melanoma Son      Social History   Socioeconomic History   Marital status: Widowed    Spouse name: Not on file   Number of children: 7   Years of education: college   Highest education level: Not on file  Occupational History   Occupation: retired     Fish farm manager: RETIRED  Tobacco Use   Smoking status: Never Smoker   Smokeless tobacco: Never Used  Scientific laboratory technician Use: Never used  Substance and Sexual Activity   Alcohol use: No    Alcohol/week: 0.0 standard drinks   Drug use: No   Sexual activity: Not Currently    Birth control/protection: Surgical    Comment: hyst  Other Topics Concern   Not on file  Social History Narrative   Not on file   Social Determinants of Health   Financial Resource Strain: Low Risk    Difficulty of Paying Living Expenses: Not hard at all  Food Insecurity: No Food Insecurity   Worried About Charity fundraiser in the Last Year: Never true   [place] in the Last Year: Never true  Transportation Needs: No Transportation Needs   Lack of Transportation (Medical): No   Lack of Transportation (Non-Medical): No  Physical Activity: Inactive   Days of Exercise per Week: 0 days   Minutes of Exercise per Session: 0 min  Stress: Stress Concern Present   Feeling of Stress : To some extent  Social Connections: Socially Isolated   Frequency of Communication with Friends and Family: Once a week   Frequency of Social Gatherings with Friends and Family: More than three times a week   Attends Religious Services: Never   Marine scientist or Organizations: No   Attends Archivist Meetings: Never   Marital Status: Widowed  Human resources officer  Violence: Not At Risk   Fear of Current or Ex-Partner: No   Emotionally Abused: No   Physically Abused: No   Sexually Abused: No     BP 114/68    Pulse 74    Ht 5\' 6"  (1.676  m)    Wt 157 lb 9.6 oz (71.5 kg) Comment: Daughter stated   SpO2 98%    BMI 25.44 kg/m   Physical Exam:  Well appearing NAD HEENT: Unremarkable Neck:  No JVD, no thyromegally Lymphatics:  No adenopathy Back:  No CVA tenderness Lungs:  Clear with no wheezes HEART:  IRegular rate rhythm, no murmurs, no rubs, no clicks Abd:  soft, positive bowel sounds, no organomegally, no rebound, no guarding Ext:  2 plus pulses, no edema, no cyanosis, no clubbing Skin:  No rashes no nodules Neuro:  CN II through XII intact, motor grossly intact   DEVICE  Normal device function.  See PaceArt for details.   Assess/Plan: 1. Persistent atrial fib - her VR is mostly controlled. She is pacing 73%. 2. PPM - her [employer] PPM is working normally with 10 years of battery longevity. 3. HTN - her bp is much improved. 4. Diastolic heart failure - her diuretic was changed from lasix to torsemide.  Carleene Overlie Roni Friberg,MD

## 2020-07-21 ENCOUNTER — Encounter (HOSPITAL_COMMUNITY): Payer: Medicare Other

## 2020-07-21 ENCOUNTER — Telehealth: Payer: Self-pay | Admitting: *Deleted

## 2020-07-21 ENCOUNTER — Other Ambulatory Visit: Payer: Self-pay | Admitting: Internal Medicine

## 2020-07-21 MED ORDER — METOPROLOL TARTRATE 100 MG PO TABS
100.0000 mg | ORAL_TABLET | Freq: Two times a day (BID) | ORAL | 1 refills | Status: DC
Start: 2020-07-21 — End: 2021-03-09

## 2020-07-21 NOTE — Telephone Encounter (Signed)
-----   Message from Arnoldo Lenis, MD sent at 07/20/2020  1:55 PM EST ----- Labs show some increased stress on the kidneys. How is her swelling and breathing doing. Verify she is taking torsemide 40mg  bid, if so please change to 40mg  daily.  Zandra Abts MD

## 2020-07-21 NOTE — Telephone Encounter (Signed)
Pt granddaughter confirmed pt was taking torsemide 40 mg bid and will decrease to 40 mg daily - says pt swelling is ok and weight is at 159lbs consistently - denies any SOB at this time - updated medication list and send refills of Lopressor 100 mg to [facility] as requested

## 2020-07-27 ENCOUNTER — Ambulatory Visit (INDEPENDENT_AMBULATORY_CARE_PROVIDER_SITE_OTHER): Payer: Medicare Other | Admitting: *Deleted

## 2020-07-27 DIAGNOSIS — I480 Paroxysmal atrial fibrillation: Secondary | ICD-10-CM

## 2020-07-27 DIAGNOSIS — Z5181 Encounter for therapeutic drug level monitoring: Secondary | ICD-10-CM

## 2020-07-27 LAB — POCT INR: INR: 3.5 — AB (ref 2.0–3.0)

## 2020-07-27 NOTE — Patient Instructions (Signed)
Hold warfarin tonight then decrease dose to 1/2 tablet daily except 1 tablet on Wednesdays   Recheck in 3 weeks Continue greens

## 2020-07-29 ENCOUNTER — Ambulatory Visit (INDEPENDENT_AMBULATORY_CARE_PROVIDER_SITE_OTHER): Payer: Medicare Other

## 2020-07-29 DIAGNOSIS — I495 Sick sinus syndrome: Secondary | ICD-10-CM

## 2020-07-29 LAB — CUP PACEART REMOTE DEVICE CHECK
Battery Remaining Longevity: 118 mo
Battery Remaining Percentage: 95.5 %
Battery Voltage: 3.01 V
Brady Statistic RV Percent Paced: 66 %
Date Time Interrogation Session: 20211202035643
Implantable Lead Implant Date: 20110422
Implantable Lead Implant Date: 20110422
Implantable Lead Location: 753859
Implantable Lead Location: 753860
Implantable Pulse Generator Implant Date: 20180319
Lead Channel Impedance Value: 410 Ohm
Lead Channel Pacing Threshold Amplitude: 1 V
Lead Channel Pacing Threshold Pulse Width: 0.5 ms
Lead Channel Sensing Intrinsic Amplitude: 3.2 mV
Lead Channel Setting Pacing Amplitude: 2 V
Lead Channel Setting Pacing Pulse Width: 0.5 ms
Lead Channel Setting Sensing Sensitivity: 1 mV
Pulse Gen Model: 2272
Pulse Gen Serial Number: 8010902

## 2020-07-30 ENCOUNTER — Ambulatory Visit: Payer: Medicare Other | Admitting: Cardiology

## 2020-07-30 ENCOUNTER — Other Ambulatory Visit: Payer: Self-pay

## 2020-07-30 ENCOUNTER — Telehealth: Payer: Self-pay | Admitting: Cardiology

## 2020-07-30 ENCOUNTER — Encounter: Payer: Self-pay | Admitting: Cardiology

## 2020-07-30 ENCOUNTER — Other Ambulatory Visit (HOSPITAL_COMMUNITY)
Admission: RE | Admit: 2020-07-30 | Discharge: 2020-07-30 | Disposition: A | Discharge status: Home / Self Care | Payer: Medicare Other | Source: Ambulatory Visit | Attending: Cardiology | Admitting: Cardiology

## 2020-07-30 ENCOUNTER — Other Ambulatory Visit: Payer: Self-pay | Admitting: Family Medicine

## 2020-07-30 ENCOUNTER — Telehealth: Payer: Self-pay | Admitting: *Deleted

## 2020-07-30 VITALS — BP 130/80 | HR 79 | Ht 66.0 in | Wt 162.6 lb

## 2020-07-30 DIAGNOSIS — Z79899 Other long term (current) drug therapy: Secondary | ICD-10-CM

## 2020-07-30 DIAGNOSIS — R600 Localized edema: Secondary | ICD-10-CM

## 2020-07-30 DIAGNOSIS — I4891 Unspecified atrial fibrillation: Secondary | ICD-10-CM | POA: Diagnosis not present

## 2020-07-30 LAB — BASIC METABOLIC PANEL
Anion gap: 9 (ref 5–15)
BUN: 29 mg/dL — ABNORMAL HIGH (ref 8–23)
CO2: 29 mmol/L (ref 22–32)
Calcium: 9.4 mg/dL (ref 8.9–10.3)
Chloride: 98 mmol/L (ref 98–111)
Creatinine, Ser: 1.45 mg/dL — ABNORMAL HIGH (ref 0.44–1.00)
GFR, Estimated: 34 mL/min — ABNORMAL LOW (ref >60)
Glucose, Bld: 98 mg/dL (ref 70–99)
Potassium: 4 mmol/L (ref 3.5–5.1)
Sodium: 136 mmol/L (ref 135–145)

## 2020-07-30 LAB — MAGNESIUM: Magnesium: 2.1 mg/dL (ref 1.7–2.4)

## 2020-07-30 NOTE — Telephone Encounter (Signed)
New message     Patient daughter calling about lab results - should she give her mother an extra fluid pill ?

## 2020-07-30 NOTE — Telephone Encounter (Signed)
Labs reviewed with Dr. Harl Bowie. Verbal order to take Torsemide 40 mg in the AM and 20 mg in the PM over the weekend and call with results on Monday.

## 2020-07-30 NOTE — Progress Notes (Signed)
Clinical Summary Ms. Wayne is a 86 y.o.female seen today for follow up of the following medical problems   1. Paroxysmal afib -Has not been interested in NOACs.   - admission 06/2019 with afib with RVR. Was on dilt gtt, converted to oral dilt 120mg  daily and discharged - in general patient has not had any symptoms. The tachycardia was noted by a home vitals check prior to admission with rates 130s - some recurrent asymptomatic tachycardia noted at home, patient brought to ER yesterday. Cardizem increased to 180mg , discharged from ER.   - occasional palpitations. Infrequent - no bleeding on coumadin.   - some recent high HRs, lopressor was increased to 125mg  bid at last visit with PA Lenze.  -no recent palpitaiotns, normal histogram from recent pacemaker check      2. Symptomatic bradycardia with pacemaker - followed by EP  3. HTN - compliant with meds    4. Hyperlipidemia - 02/2018 TC 132 HDL 42 TG 195 LDL 63 -she is compliant with statin 10/2019 TC 132 HDL 63 TG 105 LDL 50  5. Edema - 06/24/20 labs Cr 1.2 BUN 26 BNP 2779  - 06/2020 echo LVEF 55-60%, indet DDx  - weight as high 169 lbs, lowest 159 lbs. Home 160.8 lbs - lasix 80mg  bid. Breathing is up and down.  - prior weights as low as 153 lbs.      - some uptrend in Cr from 1.2 to 1.6, we lowered torsemide to 40mg  daily from bid dosing.   - day before thanksgiving lowred torsemide to 40mg  daily. Weight at that time 159 lbs, has uptrended to 162 lbs with some increased swelling and edema, no signifnicant SOB/DOE    SH: daughter of [name] who is also a patient of mine. She has a great grandsone here with her today started kindergarten, a great grandaughter who is 56 yos   -    Has not had covid vaccine.    Past Medical History:  Diagnosis Date  . Annual physical exam 04/22/2015  . Anxiety   . Arthritis   . Arthritis   . Asthma   . Atrial fibrillation ([place])   .  Bleeding disorder ([place])   . Gastroesophageal reflux disease   . Hearing impairment   . Heart disease   . Heart murmur   . High cholesterol   . Hyperlipidemia   . Hypertension   . Hypothyroidism   . Impaired glucose tolerance   . Macular degeneration   . Pancreatitis   . Paroxysmal atrial fibrillation ([place]) 2011  . Sick sinus syndrome ([place]) 2011   Atrial fibrilation 10/2009; and dual chamber pacemaker in 4/11; normal EF  . Sleep apnea    Nocturnal oxygen therapy  . Zoster 2016     No Known Allergies   Current Outpatient Medications  Medication Sig Dispense Refill  . Ascorbic Acid (VITAMIN C PO) Take 1 tablet by mouth daily.    . busPIRone (BUSPAR) 5 MG tablet TAKE 1 TABLET BY MOUTH TWICE DAILY 60 tablet 0  . Calcium Carb-Cholecalciferol 500-400 MG-UNIT CHEW Chew 1 tablet by mouth daily.  60 tablet   . Cholecalciferol (VITAMIN D3 PO) Take 2 tablets by mouth daily.    . Cyanocobalamin (VITAMIN B12 PO) Take 1 tablet by mouth daily.    Marland Kitchen diltiazem (CARDIZEM CD) 240 MG 24 hr capsule TAKE ONE CAPSULE BY MOUTH ONCE DAILY. 30 capsule 11  . donepezil (ARICEPT) 5 MG tablet TAKE (1) TABLET BY MOUTH  AT BEDTIME. 90 tablet 1  . fluticasone (FLONASE) 50 MCG/ACT nasal spray Place 1 spray into both nostrils daily.    Marland Kitchen levothyroxine (SYNTHROID) 112 MCG tablet TAKE 1 TABLET BY MOUTH ONCE DAILY. 90 tablet 0  . loratadine (CLARITIN) 10 MG tablet Take 1 tablet (10 mg total) by mouth daily. 30 tablet 1  . lovastatin (MEVACOR) 40 MG tablet TAKE ONE TABLET BY MOUTH AT BEDTIME. 90 tablet 3  . meclizine (ANTIVERT) 12.5 MG tablet Take 1 tablet (12.5 mg total) by mouth 2 (two) times daily as needed for dizziness. 30 tablet 0  . metoprolol tartrate (LOPRESSOR) 100 MG tablet Take 1 tablet (100 mg total) by mouth 2 (two) times daily. 180 tablet 1  . metoprolol tartrate (LOPRESSOR) 25 MG tablet Take 1 tablet (25 mg total) by mouth 2 (two) times daily. Take this in addition to the 100mg  twice a day 180 tablet 3   . Multiple Vitamins-Minerals (EYE VITAMINS & MINERALS PO) Take 1 tablet by mouth daily.    Marland Kitchen omeprazole (PRILOSEC) 40 MG capsule TAKE (1) CAPSULE BY MOUTH ONCE DAILY FOR ACID REFLUX. 30 capsule 5  . OXYGEN-HELIUM IN Inhale 2 L into the lungs at bedtime.    . torsemide (DEMADEX) 20 MG tablet Take 40 mg by mouth daily.    Marland Kitchen UNABLE TO FIND Compression hose 15-19 mmhg 1 pair DX leg swelling 1 each 0  . warfarin (COUMADIN) 5 MG tablet TAKE 1/2 TABLET BY MOUTH DAILY EXCEPT 1 TABLET ON WEDNESDAYS & SATURDAYS OR AS DIRECTED. 25 tablet 6  . zinc gluconate 50 MG tablet Take 50 mg by mouth daily.     Current Facility-Administered Medications  Medication Dose Route Frequency Provider Last Rate Last Admin  . miconazole (MONISTAT 7) 2 % vaginal cream 1 Applicatorful  1 Applicatorful Vaginal QHS Jonnie Kind, MD         Past Surgical History:  Procedure Laterality Date  . ABDOMINAL HYSTERECTOMY    . CATARACT EXTRACTION     Bilateral  . COLONOSCOPY  2009   Dr. Gala Romney  . EYE SURGERY     laser   . PACEMAKER INSERTION  2011   dual chamber   . PPM GENERATOR CHANGEOUT N/A 11/13/2016   Procedure: PPM Generator Changeout;  Surgeon: Evans Lance, MD;  Location: [facility];  Service: Cardiovascular;  Laterality: N/A;  . SALPINGOOPHORECTOMY  1970   right     No Known Allergies    Family History  Problem Relation Age of Onset  . Cancer Mother        gential  . Cancer Father        throat   . Pneumonia Sister   . Emphysema Sister   . Mitral valve prolapse Sister   . Heart disease Other   . Arthritis Other   . Lung disease Other   . Asthma Other   . Melanoma Son      Social History Ms. Akhtar reports that she has never smoked. She has never used smokeless tobacco. Ms. Nathanson reports no history of alcohol use.   Review of Systems CONSTITUTIONAL: No weight loss, fever, chills, weakness or fatigue.  HEENT: Eyes: No visual loss, blurred vision, double vision or yellow  sclerae.No hearing loss, sneezing, congestion, runny nose or sore throat.  SKIN: No rash or itching.  CARDIOVASCULAR: per hpi RESPIRATORY: No shortness of breath, cough or sputum.  GASTROINTESTINAL: No anorexia, nausea, vomiting or diarrhea. No abdominal pain or blood.  GENITOURINARY: No burning on urination, no polyuria NEUROLOGICAL: No headache, dizziness, syncope, paralysis, ataxia, numbness or tingling in the extremities. No change in bowel or bladder control.  MUSCULOSKELETAL: No muscle, back pain, joint pain or stiffness.  LYMPHATICS: No enlarged nodes. No history of splenectomy.  PSYCHIATRIC: No history of depression or anxiety.  ENDOCRINOLOGIC: No reports of sweating, cold or heat intolerance. No polyuria or polydipsia.  Marland Kitchen   Physical Examination Today's Vitals   07/30/20 0838  BP: 130/80  Pulse: 79  SpO2: 96%  Weight: 162 lb 9.6 oz (73.8 kg)  Height: 5\' 6"  (1.676 m)   Body mass index is 26.24 kg/m.  Gen: resting comfortably, no acute distress HEENT: no scleral icterus, pupils equal round and reactive, no palptable cervical adenopathy,  CV: RRR, no m/r/g no jvd Resp: Clear to auscultation bilaterally GI: abdomen is soft, non-tender, non-distended, normal bowel sounds, no hepatosplenomegaly MSK: extremities are warm, 1+ bilateral LE edema Skin: warm, no rash Neuro:  no focal deficits Psych: appropriate affect   Diagnostic Studies 07/04/19 echo IMPRESSIONS   1. Left ventricular ejection fraction, by visual estimation, is 65 to 70%. The left ventricle has normal function. There is mildly increased left ventricular hypertrophy. 2. Left ventricular diastolic parameters are indeterminate in the setting of atrial fibrillation. 3. Global right ventricle has normal systolic function.The right ventricular size is mildly enlarged. No increase in right ventricular wall thickness. 4. Left atrial size was upper normal. 5. Right atrial size was upper normal. 6. Mild  mitral annular calcification. 7. Moderate aortic valve annular calcification. 8. The mitral valve is grossly normal. Mild mitral valve regurgitation. 9. The tricuspid valve is grossly normal. Tricuspid valve regurgitation is trivial. 10. The aortic valve is tricuspid. Aortic valve regurgitation is not visualized. Mild aortic valve sclerosis without stenosis. 11. The pulmonic valve was grossly normal. Pulmonic valve regurgitation is trivial. 12. A pacer wire is visualized in the RV and RA. 13. The inferior vena cava is normal in size with greater than 50% respiratory variability, suggesting right atrial pressure of 3 mmHg. 14. Mildly elevated pulmonary artery systolic pressure. 15. The tricuspid regurgitant velocity is 2.71 m/s, and with an assumed right atrial pressure of 3 mmHg, the estimated right ventricular systolic pressure is mildly elevated at 32.4 mmHg.    Assessment and Plan  1. Afib -doing well on higher lopressor dose, continue current therapy.   2. LE edema - uptrend in weight on torsemide 40mg  daily, on 40mg  bid had uptrend in Cr - goal weight around 160 lbs. Will repeat labs, if Cr back down start torsemide 40mg  in AM and 20mg  PM.  -also has upcoming vascular evaluation    Arnoldo Lenis, M.D.

## 2020-07-30 NOTE — Telephone Encounter (Signed)
[name] daughter notified and voiced understanding.

## 2020-07-30 NOTE — Patient Instructions (Signed)
Medication Instructions:  Your physician recommends that you continue on your current medications as directed. Please refer to the Current Medication list given to you today.  *If you need a refill on your cardiac medications before your next appointment, please call your pharmacy*   Lab Work: BMET, [place] today  If you have labs (blood work) drawn today and your tests are completely normal, you will receive your results only by: Marland Kitchen MyChart Message (if you have MyChart) OR . A paper copy in the mail If you have any lab test that is abnormal or we need to change your treatment, we will call you to review the results.   Testing/Procedures: None today   Follow-Up: At [facility], you and your health needs are our priority.  As part of our continuing mission to provide you with exceptional heart care, we have created designated Provider Care Teams.  These Care Teams include your primary Cardiologist (physician) and Advanced Practice Providers (APPs -  Physician Assistants and Nurse Practitioners) who all work together to provide you with the care you need, when you need it.  We recommend signing up for the patient portal called "MyChart".  Sign up information is provided on this After Visit Summary.  MyChart is used to connect with patients for Virtual Visits (Telemedicine).  Patients are able to view lab/test results, encounter notes, upcoming appointments, etc.  Non-urgent messages can be sent to your provider as well.   To learn more about what you can do with MyChart, go to [email or web address].    Your next appointment:   2 month(s)  The format for your next appointment:   In Person  Provider:   You may see Carlyle Dolly, MD or one of the following Advanced Practice Providers on your designated Care Team:    Bernerd Pho, PA-C   Ermalinda Barrios, PA-C     Other Instructions None      Thank you for choosing [facility]  !

## 2020-07-30 NOTE — Telephone Encounter (Signed)
Spoke with daughter who states that she was seen in the office this morning. Daughter asking it Lasix needs to be adjusted because of recent lab results.

## 2020-07-31 ENCOUNTER — Other Ambulatory Visit: Payer: Self-pay | Admitting: Family Medicine

## 2020-08-02 ENCOUNTER — Other Ambulatory Visit: Payer: Self-pay

## 2020-08-02 ENCOUNTER — Ambulatory Visit (HOSPITAL_COMMUNITY)
Admission: RE | Admit: 2020-08-02 | Discharge: 2020-08-02 | Disposition: A | Discharge status: Home / Self Care | Payer: Medicare Other | Source: Ambulatory Visit | Attending: Vascular Surgery | Admitting: Vascular Surgery

## 2020-08-02 ENCOUNTER — Ambulatory Visit: Payer: Medicare Other | Admitting: Nurse Practitioner

## 2020-08-02 ENCOUNTER — Ambulatory Visit (INDEPENDENT_AMBULATORY_CARE_PROVIDER_SITE_OTHER): Payer: Medicare Other | Admitting: Physician Assistant

## 2020-08-02 VITALS — BP 123/68 | HR 77 | Temp 97.6°F | Resp 20 | Ht 66.0 in | Wt 158.0 lb

## 2020-08-02 DIAGNOSIS — M7989 Other specified soft tissue disorders: Secondary | ICD-10-CM

## 2020-08-02 DIAGNOSIS — R0902 Hypoxemia: Secondary | ICD-10-CM | POA: Diagnosis not present

## 2020-08-02 NOTE — Telephone Encounter (Signed)
I  Spoke with Dr.Branch and he states L.Pinnix, LPN, spoke with daughter on 07/30/20 and instructed her to take extra lasix.

## 2020-08-02 NOTE — Progress Notes (Signed)
VASCULAR & VEIN SPECIALISTS           OF [place]  History and Physical   [name] is a 86 y.o. female who presents with hx of bilateral leg swelling.  She is accompanied by her daughter in person and another daughter on face time.   Difficult to obtain history as pt has dementia.  Daughter states the pt c/o her legs being heavy at times and the left is worse than the right.  She is on a diuretic, which has been helping with the swelling.  Her kidney function is being monitored for this.  Daughter states they were measured for compression at [facility] and bought knee high compression.  Daughter states pt did not tolerate this and made her swelling worse.  Daughters have tried to get pt to elevate legs, but pt will kick the pillows off the bed.  Pt does not have any non healing wounds on her feet.  Daughter states she gets pockets on the top of her feet and these have turned black in the past.  She does not have any evidence of this today.  She denies any pain in her feet.  She does not have any hx of DVT.  Unable to determine if there is any family hx of venous disease.  Pt had worked for many years on her feet.  She has hx of seven pregnancies.     She has hx of afib, sick sinus syndrome with hx of PPM.   The pt is on a statin for cholesterol management.  The pt is not on a daily aspirin.   Other AC:  Coumadin for afib The pt is on BB, CCB for hypertension.   The pt is not diabetic.   Tobacco hx:  never   Past Medical History:  Diagnosis Date  . Annual physical exam 04/22/2015  . Anxiety   . Arthritis   . Arthritis   . Asthma   . Atrial fibrillation ([place])   . Bleeding disorder ([place])   . Gastroesophageal reflux disease   . Hearing impairment   . Heart disease   . Heart murmur   . High cholesterol   . Hyperlipidemia   . Hypertension   . Hypothyroidism   . Impaired glucose tolerance   . Macular degeneration   . Pancreatitis   . Paroxysmal atrial  fibrillation ([place]) 2011  . Sick sinus syndrome ([place]) 2011   Atrial fibrilation 10/2009; and dual chamber pacemaker in 4/11; normal EF  . Sleep apnea    Nocturnal oxygen therapy  . Zoster 2016    Past Surgical History:  Procedure Laterality Date  . ABDOMINAL HYSTERECTOMY    . CATARACT EXTRACTION     Bilateral  . COLONOSCOPY  2009   Dr. Gala Romney  . EYE SURGERY     laser   . PACEMAKER INSERTION  2011   dual chamber   . PPM GENERATOR CHANGEOUT N/A 11/13/2016   Procedure: PPM Generator Changeout;  Surgeon: Evans Lance, MD;  Location: [place] [facility];  Service: Cardiovascular;  Laterality: N/A;  . SALPINGOOPHORECTOMY  1970   right    Social History   Socioeconomic History  . Marital status: Widowed    Spouse name: Not on file  . Number of children: 7  . Years of education: college  . Highest education level: Not on file  Occupational History  . Occupation: retired     Fish farm manager: RETIRED  Tobacco Use  . Smoking status: Never Smoker  . Smokeless tobacco: Never Used  Vaping Use  . Vaping Use: Never used  Substance and Sexual Activity  . Alcohol use: No    Alcohol/week: 0.0 standard drinks  . Drug use: No  . Sexual activity: Not Currently    Birth control/protection: Surgical    Comment: hyst  Other Topics Concern  . Not on file  Social History Narrative  . Not on file   Social Determinants of Health   Financial Resource Strain: Low Risk   . Difficulty of Paying Living Expenses: Not hard at all  Food Insecurity: No Food Insecurity  . Worried About Charity fundraiser in the Last Year: Never true  . Ran Out of Food in the Last Year: Never true  Transportation Needs: No Transportation Needs  . Lack of Transportation (Medical): No  . Lack of Transportation (Non-Medical): No  Physical Activity: Inactive  . Days of Exercise per Week: 0 days  . Minutes of Exercise per Session: 0 min  Stress: Stress Concern Present  . Feeling of Stress : To some extent  Social  Connections: Socially Isolated  . Frequency of Communication with Friends and Family: Once a week  . Frequency of Social Gatherings with Friends and Family: More than three times a week  . Attends Religious Services: Never  . Active Member of Clubs or Organizations: No  . Attends Archivist Meetings: Never  . Marital Status: Widowed  Intimate Partner Violence: Not At Risk  . Fear of Current or Ex-Partner: No  . Emotionally Abused: No  . Physically Abused: No  . Sexually Abused: No     Family History  Problem Relation Age of Onset  . Cancer Mother        gential  . Cancer Father        throat   . Pneumonia Sister   . Emphysema Sister   . Mitral valve prolapse Sister   . Heart disease Other   . Arthritis Other   . Lung disease Other   . Asthma Other   . Melanoma Son     Current Outpatient Medications  Medication Sig Dispense Refill  . Ascorbic Acid (VITAMIN C PO) Take 1 tablet by mouth daily.    . busPIRone (BUSPAR) 5 MG tablet TAKE 1 TABLET BY MOUTH TWICE DAILY 60 tablet 0  . Calcium Carb-Cholecalciferol 500-400 MG-UNIT CHEW Chew 1 tablet by mouth daily.  60 tablet   . Cholecalciferol (VITAMIN D3 PO) Take 2 tablets by mouth daily.    . Cyanocobalamin (VITAMIN B12 PO) Take 1 tablet by mouth daily.    Marland Kitchen diltiazem (CARDIZEM CD) 240 MG 24 hr capsule TAKE ONE CAPSULE BY MOUTH ONCE DAILY. 30 capsule 11  . donepezil (ARICEPT) 5 MG tablet TAKE (1) TABLET BY MOUTH AT BEDTIME. 90 tablet 1  . fluticasone (FLONASE) 50 MCG/ACT nasal spray Place 1 spray into both nostrils daily.    Marland Kitchen levothyroxine (SYNTHROID) 112 MCG tablet TAKE 1 TABLET BY MOUTH ONCE DAILY. 90 tablet 0  . loratadine (CLARITIN) 10 MG tablet Take 1 tablet (10 mg total) by mouth daily. 30 tablet 1  . lovastatin (MEVACOR) 40 MG tablet TAKE ONE TABLET BY MOUTH AT BEDTIME. 90 tablet 3  . meclizine (ANTIVERT) 12.5 MG tablet Take 1 tablet (12.5 mg total) by mouth 2 (two) times daily as needed for dizziness. 30 tablet  0  . metoprolol tartrate (LOPRESSOR) 100 MG tablet Take 1 tablet (100  mg total) by mouth 2 (two) times daily. 180 tablet 1  . metoprolol tartrate (LOPRESSOR) 25 MG tablet Take 1 tablet (25 mg total) by mouth 2 (two) times daily. Take this in addition to the 100mg  twice a day 180 tablet 3  . Multiple Vitamins-Minerals (EYE VITAMINS & MINERALS PO) Take 1 tablet by mouth daily.    Marland Kitchen omeprazole (PRILOSEC) 40 MG capsule TAKE (1) CAPSULE BY MOUTH ONCE DAILY FOR ACID REFLUX. 30 capsule 0  . OXYGEN-HELIUM IN Inhale 2 L into the lungs at bedtime.    . torsemide (DEMADEX) 20 MG tablet Take 40 mg by mouth daily.    Marland Kitchen warfarin (COUMADIN) 5 MG tablet TAKE 1/2 TABLET BY MOUTH DAILY EXCEPT 1 TABLET ON WEDNESDAYS & SATURDAYS OR AS DIRECTED. 25 tablet 6  . zinc gluconate 50 MG tablet Take 50 mg by mouth daily.     Current Facility-Administered Medications  Medication Dose Route Frequency Provider Last Rate Last Admin  . miconazole (MONISTAT 7) 2 % vaginal cream 1 Applicatorful  1 Applicatorful Vaginal QHS Jonnie Kind, MD        No Known Allergies  REVIEW OF SYSTEMS:   [X]  denotes positive finding, [ ]  denotes negative finding Cardiac  Comments:  Chest pain or chest pressure:    Shortness of breath upon exertion:    Short of breath when lying flat:    Irregular heart rhythm:        Vascular    Pain in calf, thigh, or hip brought on by ambulation:    Pain in feet at night that wakes you up from your sleep:     Blood clot in your veins:    Leg swelling:  x       Pulmonary    Oxygen at home:    Productive cough:     Wheezing:         Neurologic    Sudden weakness in arms or legs:     Sudden numbness in arms or legs:     Sudden onset of difficulty speaking or slurred speech:    Temporary loss of vision in one eye:     Problems with dizziness:         Gastrointestinal    Blood in stool:     Vomited blood:         Genitourinary    Burning when urinating:     Blood in urine:          Psychiatric    Major depression:         Hematologic    Bleeding problems:    Problems with blood clotting too easily:        Skin    Rashes or ulcers:        Constitutional    Fever or chills:      PHYSICAL EXAMINATION:  Today's Vitals   08/02/20 1151  BP: 123/68  Pulse: 77  Resp: 20  Temp: 97.6 F (36.4 C)  TempSrc: Temporal  SpO2: 95%  Weight: 158 lb (71.7 kg)  Height: 5\' 6"  (1.676 m)   Body mass index is 25.5 kg/m.   General:  WDWN in NAD; vital signs documented above Gait: slow with walker HENT: WNL, normocephalic; HOH Pulmonary: normal non-labored breathing without wheezing Cardiac: regular HR; without carotid bruits Skin: without rashes Vascular Exam/Pulses:  Right Left  Radial 2+ (normal) 2+ (normal)  DP 1+ (weak) 1+ (weak)  PT Unable to palpate Unable to palpate  Extremities: without ischemic changes, without cellulitis; without open wounds; without skin color changes;  Musculoskeletal: no muscle wasting or atrophy  Neurologic: A&O X 3;  moving all extremities equally Psychiatric:  The pt has Normal affect.   Non-Invasive Vascular Imaging:   Venous duplex on 08/02/2020: Venous Reflux Times  +--------------+---------+------+-----------+------------+-----------------  ----+  RIGHT     Reflux NoRefluxReflux TimeDiameter cmsComments                  Yes                       +--------------+---------+------+-----------+------------+-----------------  CFV      no                             +--------------+---------+------+-----------+------------+-----------------  FV mid    no                             +--------------+---------+------+-----------+------------+-----------------  Popliteal   no                              +--------------+---------+------+-----------+------------+-----------------  GSV at SFJ  no               0.44            +--------------+---------+------+-----------+------------+-----------------  GSV prox thighno               0.45            +--------------+---------+------+-----------+------------+-----------------  GSV mid thigh no               0.36            +--------------+---------+------+-----------+------------+-----------------  GSV dist thighno               0.33            +--------------+---------+------+-----------+------------+-----------------  GSV at knee  no               0.33            +--------------+---------+------+-----------+------------+-----------------  GSV prox calf       yes  >500 ms   0.34            +--------------+---------+------+-----------+------------+-----------------   SSV Pop Fossa       yes  >500 ms   0.35  Saphenopopliteal  junction only    +--------------+---------+------+-----------+------------+-----------------  SSV prox calf       yes  >500 ms   0.57            +--------------+---------+------+-----------+------------+-----------------   SSV mid calf       yes  >500 ms   0.36            +--------------+---------+------+-----------+------------+-----------------  AASV P          yes  >500 ms   0.24            +--------------+---------+------+-----------+------------+-----------------  AASV M          yes  >500 ms   0.25            +--------------+---------+------+-----------+------------+-----------------  AASV D          yes  >500 ms   0.42             +--------------+---------+------+-----------+------------+-----------------     +--------------+---------+------+-----------+------------+--------+  LEFT     Reflux NoRefluxReflux TimeDiameter cmsComments  Yes                   +--------------+---------+------+-----------+------------+--------+  CFV            yes  >1 second             +--------------+---------+------+-----------+------------+--------+  FV mid    no                         +--------------+---------+------+-----------+------------+--------+  Popliteal   no                         +--------------+---------+------+-----------+------------+--------+  GSV at SFJ        yes  >500 ms   0.83        +--------------+---------+------+-----------+------------+--------+  GSV prox thigh      yes  >500 ms   0.35        +--------------+---------+------+-----------+------------+--------+  GSV mid thigh       yes  >500 ms   0.26        +--------------+---------+------+-----------+------------+--------+  GSV dist thighno               0.17        +--------------+---------+------+-----------+------------+--------+  GSV at knee  no               0.27        +--------------+---------+------+-----------+------------+--------+  GSV prox calf no               0.36        +--------------+---------+------+-----------+------------+--------+  SSV Pop Fossa no               0.26        +--------------+---------+------+-----------+------------+--------+  SSV prox calf no               0.29        +--------------+---------+------+-----------+------------+--------+  SSV mid calf no                0.36        +--------------+---------+------+-----------+------------+--------+  AASV P          yes  >500 ms   0.54        +--------------+---------+------+-----------+------------+--------+  AASV M          yes  >500 ms   0.46        +--------------+---------+------+-----------+------------+--------+  AASV D          yes  >500 ms   0.42        +--------------+---------+------+-----------+------------+--------+   Summary:  Right:  - No evidence of deep vein thrombosis seen in the right lower extremity, from the common femoral through the popliteal veins.  - No evidence of superficial venous thrombosis in the right lower  extremity.  - Venous reflux is noted in the right greater saphenous vein in the  proximal calf.  - Venous reflux is noted in the right short saphenous vein.  -Venous reflux is noted in the right AASV.     Left:  - No evidence of deep vein thrombosis seen in the left lower extremity, from the common femoral through the popliteal veins.  - No evidence of superficial venous thrombosis in the left lower  extremity.  - Venous reflux is noted in the left common femoral vein.  - Venous reflux is noted in the left sapheno-femoral junction.  - Venous reflux is noted in the left greater saphenous vein in the thigh.  - Venous reflux is  noted in the left AASV.    [name] is a 86 y.o. female who presents with: BLE leg swelling and heaviness with left worse than right.  Pt does have evidence of venous insufficiency bilaterally, but vein diameter is not sufficient for laser ablation.  She does not have any evidence of DVT.   She does have palpable 1+ DP pulses bilaterally.   -discussed with pt about wearing thigh high compression stockings and elevating legs.   Pt does not want to wear compression.  She has not wanted to elevate her legs.  They have been using pillows but will try a  wedge to see if that is more tolerable for the pt.   -handout about venous disease given to pt.  -f/u as needed.    Leontine Locket, [facility] 08/02/2020 12:44 PM  Clinic MD:  Trula Slade

## 2020-08-03 ENCOUNTER — Other Ambulatory Visit: Payer: Self-pay

## 2020-08-03 ENCOUNTER — Ambulatory Visit (INDEPENDENT_AMBULATORY_CARE_PROVIDER_SITE_OTHER): Payer: Medicare Other | Admitting: Nurse Practitioner

## 2020-08-03 ENCOUNTER — Encounter: Payer: Self-pay | Admitting: Nurse Practitioner

## 2020-08-03 VITALS — BP 116/71 | HR 116 | Temp 98.2°F | Resp 20 | Ht 66.0 in | Wt 157.0 lb

## 2020-08-03 DIAGNOSIS — N390 Urinary tract infection, site not specified: Secondary | ICD-10-CM | POA: Diagnosis not present

## 2020-08-03 MED ORDER — SULFAMETHOXAZOLE-TRIMETHOPRIM 800-160 MG PO TABS: 1.0000 | ORAL_TABLET | Freq: Two times a day (BID) | ORAL | 0 refills | Status: DC

## 2020-08-03 NOTE — Assessment & Plan Note (Signed)
-  unable to provide a sample at this time; sent home with sample cup and nun's cap -Rx. Bactrim; last UTI was cipro and macrobid resistant -may change abx based on culture results

## 2020-08-03 NOTE — Progress Notes (Signed)
Acute Office Visit  Subjective:    Patient ID: [name], female    DOB: [birth year], 86 y.o.   MRN: [id number]  Chief Complaint  Patient presents with  . Dysuria    x 1 week     HPI Patient is in today for dysuria that started about a week ago.  Denies fever or abdominal/flank pain.   Endorses cloudy urine and foul odor.  Past Medical History:  Diagnosis Date  . Annual physical exam 04/22/2015  . Anxiety   . Arthritis   . Arthritis   . Asthma   . Atrial fibrillation ([place])   . Bleeding disorder ([place])   . Gastroesophageal reflux disease   . Hearing impairment   . Heart disease   . Heart murmur   . High cholesterol   . Hyperlipidemia   . Hypertension   . Hypothyroidism   . Impaired glucose tolerance   . Macular degeneration   . Pancreatitis   . Paroxysmal atrial fibrillation ([place]) 2011  . Sick sinus syndrome ([place]) 2011   Atrial fibrilation 10/2009; and dual chamber pacemaker in 4/11; normal EF  . Sleep apnea    Nocturnal oxygen therapy  . Zoster 2016    Past Surgical History:  Procedure Laterality Date  . ABDOMINAL HYSTERECTOMY    . CATARACT EXTRACTION     Bilateral  . COLONOSCOPY  2009   Dr. Gala Romney  . EYE SURGERY     laser   . PACEMAKER INSERTION  2011   dual chamber   . PPM GENERATOR CHANGEOUT N/A 11/13/2016   Procedure: PPM Generator Changeout;  Surgeon: Evans Lance, MD;  Location: [facility];  Service: Cardiovascular;  Laterality: N/A;  . SALPINGOOPHORECTOMY  1970   right    Family History  Problem Relation Age of Onset  . Cancer Mother        gential  . Cancer Father        throat   . Pneumonia Sister   . Emphysema Sister   . Mitral valve prolapse Sister   . Heart disease Other   . Arthritis Other   . Lung disease Other   . Asthma Other   . Melanoma Son     Social History   Socioeconomic History  . Marital status: Widowed    Spouse name: Not on file  . Number of children: 7  . Years of education: college  . Highest  education level: Not on file  Occupational History  . Occupation: retired     Fish farm manager: RETIRED  Tobacco Use  . Smoking status: Never Smoker  . Smokeless tobacco: Never Used  Vaping Use  . Vaping Use: Never used  Substance and Sexual Activity  . Alcohol use: No    Alcohol/week: 0.0 standard drinks  . Drug use: No  . Sexual activity: Not Currently    Birth control/protection: Surgical    Comment: hyst  Other Topics Concern  . Not on file  Social History Narrative  . Not on file   Social Determinants of Health   Financial Resource Strain: Low Risk   . Difficulty of Paying Living Expenses: Not hard at all  Food Insecurity: No Food Insecurity  . Worried About Charity fundraiser in the Last Year: Never true  . Ran Out of Food in the Last Year: Never true  Transportation Needs: No Transportation Needs  . Lack of Transportation (Medical): No  . Lack of Transportation (Non-Medical): No  Physical Activity: Inactive  .  Days of Exercise per Week: 0 days  . Minutes of Exercise per Session: 0 min  Stress: Stress Concern Present  . Feeling of Stress : To some extent  Social Connections: Socially Isolated  . Frequency of Communication with Friends and Family: Once a week  . Frequency of Social Gatherings with Friends and Family: More than three times a week  . Attends Religious Services: Never  . Active Member of Clubs or Organizations: No  . Attends Archivist Meetings: Never  . Marital Status: Widowed  Intimate Partner Violence: Not At Risk  . Fear of Current or Ex-Partner: No  . Emotionally Abused: No  . Physically Abused: No  . Sexually Abused: No    Outpatient Medications Prior to Visit  Medication Sig Dispense Refill  . Ascorbic Acid (VITAMIN C PO) Take 1 tablet by mouth daily.    . busPIRone (BUSPAR) 5 MG tablet TAKE 1 TABLET BY MOUTH TWICE DAILY 60 tablet 0  . Calcium Carb-Cholecalciferol 500-400 MG-UNIT CHEW Chew 1 tablet by mouth daily.  60 tablet   .  Cholecalciferol (VITAMIN D3 PO) Take 2 tablets by mouth daily.    . Cyanocobalamin (VITAMIN B12 PO) Take 1 tablet by mouth daily.    Marland Kitchen diltiazem (CARDIZEM CD) 240 MG 24 hr capsule TAKE ONE CAPSULE BY MOUTH ONCE DAILY. 30 capsule 11  . donepezil (ARICEPT) 5 MG tablet TAKE (1) TABLET BY MOUTH AT BEDTIME. 90 tablet 1  . fluticasone (FLONASE) 50 MCG/ACT nasal spray Place 1 spray into both nostrils daily.    Marland Kitchen levothyroxine (SYNTHROID) 112 MCG tablet TAKE 1 TABLET BY MOUTH ONCE DAILY. 90 tablet 0  . loratadine (CLARITIN) 10 MG tablet Take 1 tablet (10 mg total) by mouth daily. 30 tablet 1  . lovastatin (MEVACOR) 40 MG tablet TAKE ONE TABLET BY MOUTH AT BEDTIME. 90 tablet 3  . meclizine (ANTIVERT) 12.5 MG tablet Take 1 tablet (12.5 mg total) by mouth 2 (two) times daily as needed for dizziness. 30 tablet 0  . metoprolol tartrate (LOPRESSOR) 100 MG tablet Take 1 tablet (100 mg total) by mouth 2 (two) times daily. 180 tablet 1  . metoprolol tartrate (LOPRESSOR) 25 MG tablet Take 1 tablet (25 mg total) by mouth 2 (two) times daily. Take this in addition to the 100mg  twice a day 180 tablet 3  . Multiple Vitamins-Minerals (EYE VITAMINS & MINERALS PO) Take 1 tablet by mouth daily.    Marland Kitchen omeprazole (PRILOSEC) 40 MG capsule TAKE (1) CAPSULE BY MOUTH ONCE DAILY FOR ACID REFLUX. 30 capsule 0  . OXYGEN-HELIUM IN Inhale 2 L into the lungs at bedtime.    . torsemide (DEMADEX) 20 MG tablet Take 40 mg by mouth daily.    Marland Kitchen warfarin (COUMADIN) 5 MG tablet TAKE 1/2 TABLET BY MOUTH DAILY EXCEPT 1 TABLET ON WEDNESDAYS & SATURDAYS OR AS DIRECTED. 25 tablet 6  . zinc gluconate 50 MG tablet Take 50 mg by mouth daily.     Facility-Administered Medications Prior to Visit  Medication Dose Route Frequency Provider Last Rate Last Admin  . miconazole (MONISTAT 7) 2 % vaginal cream 1 Applicatorful  1 Applicatorful Vaginal QHS Jonnie Kind, MD        No Known Allergies  Review of Systems  Constitutional: Negative.    Respiratory: Negative.   Cardiovascular: Negative.   Genitourinary: Positive for difficulty urinating and dysuria. Negative for hematuria, pelvic pain and urgency.       Objective:    Physical Exam Abdominal:  Palpations: Abdomen is soft. There is no mass.     Tenderness: There is no abdominal tenderness. There is no right CVA tenderness or left CVA tenderness.  Neurological:     Mental Status: She is alert.     BP 116/71   Pulse (!) 116   Temp 98.2 F (36.8 C)   Resp 20   Ht 5\' 6"  (1.676 m)   Wt 157 lb (71.2 kg)   SpO2 97%   BMI 25.34 kg/m  Wt Readings from Last 3 Encounters:  08/03/20 157 lb (71.2 kg)  08/02/20 158 lb (71.7 kg)  07/30/20 162 lb 9.6 oz (73.8 kg)    Health Maintenance Due  Topic Date Due  . COVID-19 Vaccine (1) Never done    There are no preventive care reminders to display for this patient.   Lab Results  Component Value Date   TSH 1.420 06/10/2020   Lab Results  Component Value Date   WBC 9.3 06/24/2020   HGB 10.9 (L) 06/24/2020   HCT 33.1 (L) 06/24/2020   MCV 90 06/24/2020   PLT 355 06/24/2020   Lab Results  Component Value Date   NA 136 07/30/2020   K 4.0 07/30/2020   CO2 29 07/30/2020   GLUCOSE 98 07/30/2020   BUN 29 (H) 07/30/2020   CREATININE 1.45 (H) 07/30/2020   BILITOT 0.4 06/10/2020   ALKPHOS 116 06/10/2020   AST 15 06/10/2020   ALT 9 06/10/2020   PROT 7.0 06/10/2020   ALBUMIN 4.1 06/10/2020   CALCIUM 9.4 07/30/2020   ANIONGAP 9 07/30/2020   Lab Results  Component Value Date   CHOL 144 06/10/2020   Lab Results  Component Value Date   HDL 59 06/10/2020   Lab Results  Component Value Date   LDLCALC 65 06/10/2020   Lab Results  Component Value Date   TRIG 111 06/10/2020   Lab Results  Component Value Date   CHOLHDL 2.4 06/10/2020   Lab Results  Component Value Date   HGBA1C 5.6 11/22/2016       Assessment & Plan:   Problem List Items Addressed This Visit    None       No orders of the  defined types were placed in this encounter.    Noreene Larsson, NP

## 2020-08-03 NOTE — Patient Instructions (Signed)
Please don't take the bactrim until you have obtained a urine sample.

## 2020-08-04 DIAGNOSIS — N3001 Acute cystitis with hematuria: Secondary | ICD-10-CM | POA: Diagnosis not present

## 2020-08-06 ENCOUNTER — Telehealth: Payer: Self-pay | Admitting: Cardiology

## 2020-08-06 DIAGNOSIS — Z79899 Other long term (current) drug therapy: Secondary | ICD-10-CM

## 2020-08-06 NOTE — Telephone Encounter (Signed)
New message    Daughter [name] is reporting Henreitta weight   [phone] since starting the new medication ,  This new medication seems to be working

## 2020-08-06 NOTE — Telephone Encounter (Signed)
[employer], Marikay Alar, LPN     07/03/51 0:80 PM Note Labs reviewed with Dr. Harl Bowie. Verbal order to take Torsemide 40 mg in the AM and 20 mg in the PM over the weekend and call with results on Monday.         I will FYI Dr.Branch

## 2020-08-06 NOTE — Progress Notes (Signed)
Remote pacemaker transmission.   

## 2020-08-07 ENCOUNTER — Other Ambulatory Visit: Payer: Self-pay | Admitting: Cardiology

## 2020-08-09 NOTE — Addendum Note (Signed)
Addended by: Barbarann Ehlers A on: 08/09/2020 09:56 AM   Modules accepted: Orders

## 2020-08-09 NOTE — Telephone Encounter (Signed)
I spoke with daughter, I will mail lab slips for [facility] to her.

## 2020-08-09 NOTE — Telephone Encounter (Signed)
Good to hear, can we get a bmet and mg in 1 week to recheck kidney function  Zandra Abts MD

## 2020-08-11 ENCOUNTER — Telehealth: Payer: Self-pay

## 2020-08-11 NOTE — Telephone Encounter (Signed)
States pt was checked for UTI last week and still has not heard back from the culture and says her mom still has the infection and she needs something done about it

## 2020-08-12 ENCOUNTER — Other Ambulatory Visit: Payer: Self-pay | Admitting: Family Medicine

## 2020-08-12 ENCOUNTER — Ambulatory Visit (INDEPENDENT_AMBULATORY_CARE_PROVIDER_SITE_OTHER): Payer: Medicare Other

## 2020-08-12 ENCOUNTER — Other Ambulatory Visit: Payer: Self-pay

## 2020-08-12 ENCOUNTER — Telehealth: Payer: Self-pay | Admitting: Cardiology

## 2020-08-12 DIAGNOSIS — N390 Urinary tract infection, site not specified: Secondary | ICD-10-CM

## 2020-08-12 LAB — URINE CULTURE

## 2020-08-12 MED ORDER — SULFAMETHOXAZOLE-TRIMETHOPRIM 400-80 MG PO TABS: 1.0000 | ORAL_TABLET | Freq: Two times a day (BID) | ORAL | 0 refills | Status: DC

## 2020-08-12 MED ORDER — CEFTRIAXONE SODIUM 500 MG IJ SOLR
500.0000 mg | Freq: Once | INTRAMUSCULAR | Status: AC
  Administered 2020-08-12: 500 mg via INTRAMUSCULAR

## 2020-08-12 MED ORDER — TETRACYCLINE HCL 250 MG PO CAPS: 250.0000 mg | ORAL_CAPSULE | Freq: Two times a day (BID) | ORAL | 0 refills | Status: DC

## 2020-08-12 NOTE — Telephone Encounter (Signed)
This has been changed to septra and the medication is at [facility]

## 2020-08-12 NOTE — Telephone Encounter (Signed)
Resulted today. Please offer and administer today Rocephin 500 mg iM and 1 week of tetracycline Is prescribed at [facility] please let daughter know

## 2020-08-12 NOTE — Telephone Encounter (Signed)
Tetracycline is not available at any pharmacy anymore. Needs it changed to something else

## 2020-08-12 NOTE — Progress Notes (Signed)
In to get rocephin 500mg  IM per dr Moshe Cipro for uti

## 2020-08-12 NOTE — Telephone Encounter (Signed)
New message     [name] is calling regarding the recent urine test her mother had done, they said that she has an infection in her stomach, is there something that they can do for her ? Will you look at the last urine test and have Dr Harl Bowie advise them on what to do?

## 2020-08-12 NOTE — Telephone Encounter (Signed)
We suggested patient speak with Dr.Simpson who ordered the test.She states she had not heard back from their office. I advised her she would need to speak with Dr.Simpson.

## 2020-08-12 NOTE — Telephone Encounter (Signed)
Patient seen and aware

## 2020-08-13 NOTE — Telephone Encounter (Signed)
Yes, outside of my knowledge base, would defer to Franco Collet Chidinma Clites MD

## 2020-08-16 DIAGNOSIS — Z79899 Other long term (current) drug therapy: Secondary | ICD-10-CM | POA: Diagnosis not present

## 2020-08-17 ENCOUNTER — Ambulatory Visit (INDEPENDENT_AMBULATORY_CARE_PROVIDER_SITE_OTHER): Payer: Medicare Other | Admitting: *Deleted

## 2020-08-17 DIAGNOSIS — I480 Paroxysmal atrial fibrillation: Secondary | ICD-10-CM

## 2020-08-17 DIAGNOSIS — Z5181 Encounter for therapeutic drug level monitoring: Secondary | ICD-10-CM

## 2020-08-17 LAB — BASIC METABOLIC PANEL
BUN/Creatinine Ratio: 17 (ref 12–28)
BUN: 33 mg/dL (ref 10–36)
CO2: 24 mmol/L (ref 20–29)
Calcium: 9.6 mg/dL (ref 8.7–10.3)
Chloride: 100 mmol/L (ref 96–106)
Creatinine, Ser: 1.99 mg/dL — ABNORMAL HIGH (ref 0.57–1.00)
GFR calc Af Amer: 25 mL/min/{1.73_m2} — ABNORMAL LOW (ref >59)
GFR calc non Af Amer: 21 mL/min/{1.73_m2} — ABNORMAL LOW (ref >59)
Glucose: 116 mg/dL — ABNORMAL HIGH (ref 65–99)
Potassium: 3.9 mmol/L (ref 3.5–5.2)
Sodium: 139 mmol/L (ref 134–144)

## 2020-08-17 LAB — MAGNESIUM: Magnesium: 2.3 mg/dL (ref 1.6–2.3)

## 2020-08-17 LAB — POCT INR: INR: 3.5 — AB (ref 2.0–3.0)

## 2020-08-17 NOTE — Patient Instructions (Signed)
Hold warfarin tonight then continue 1/2 tablet daily except 1 tablet on Wednesdays   Recheck in 3 weeks Continue greens

## 2020-08-23 ENCOUNTER — Telehealth: Payer: Self-pay | Admitting: Student

## 2020-08-23 ENCOUNTER — Telehealth: Payer: Self-pay

## 2020-08-23 DIAGNOSIS — Z79899 Other long term (current) drug therapy: Secondary | ICD-10-CM

## 2020-08-23 MED ORDER — TORSEMIDE 20 MG PO TABS: 20.0000 mg | ORAL_TABLET | Freq: Every day | ORAL | 3 refills | Status: DC

## 2020-08-23 NOTE — Telephone Encounter (Signed)
I spoke with daughter. Lab results and med changes given.

## 2020-08-23 NOTE — Telephone Encounter (Signed)
Daughter states her mother was taking Torsemide 40 mg am and 20 mg pm. They will hold for 3 days and then resume at 20 mg daily   I mailed lab slip to patient.

## 2020-08-23 NOTE — Telephone Encounter (Signed)
New message    Patient daughter [name] is returning call to [facility]

## 2020-08-23 NOTE — Telephone Encounter (Signed)
-----   Message from Arnoldo Lenis, MD sent at 08/23/2020 10:40 AM EST ----- Significant decrease in renal function. Verify taking torsemide 40mg  daily, if so hold for 3 days, then resume 20mg  daily. Repeat a bmet in 2 weeks  Zandra Abts MD

## 2020-08-24 ENCOUNTER — Other Ambulatory Visit: Payer: Self-pay | Admitting: Family Medicine

## 2020-08-25 ENCOUNTER — Telehealth: Payer: Self-pay

## 2020-08-25 NOTE — Telephone Encounter (Signed)
Note ----- Message from Arnoldo Lenis, MD sent at 08/23/2020 10:40 AM EST ----- Significant decrease in renal function. Verify taking torsemide 40mg  daily, if so hold for 3 days, then resume 20mg  daily. Repeat a bmet in 2 weeks  Zandra Abts MD     Note Daughter states her mother was taking Torsemide 40 mg am and 20 mg pm. They will hold for 3 days and then resume at 20 mg daily                                                                                                                08/25/20:               Daughter states he moms weight  went from 159 to 163 lbs over nite after stopping the torsemide 2 days ago        Forward to [employer]

## 2020-08-26 NOTE — Telephone Encounter (Signed)
Daughter called today, patients weight is 163.8 lbs, she is taking torsemide 20 mg daily.I encouraged her to have her legs elevated when sitting and avoid all salt in diet.They eat oatmeal, yogurt, frozen veggies.Daufghter will call on Monday 08/30/20   Daughter know to call after hours if patient s weight should go over 3 lbs over nite.

## 2020-08-30 ENCOUNTER — Telehealth: Payer: Self-pay | Admitting: Cardiology

## 2020-08-30 NOTE — Telephone Encounter (Signed)
Laurie(PT's daughter) called with her mother's weight from 08/28/20 = 158, 08/29/20 = 162 & 08/30/20 = 163.8. She has had her on no salt diet, elevated her feet but she is still swollen and gaining weight.

## 2020-08-30 NOTE — Telephone Encounter (Signed)
Pt reports no SOB or chest pain at this time.

## 2020-09-01 ENCOUNTER — Other Ambulatory Visit: Payer: Self-pay | Admitting: Family Medicine

## 2020-09-01 NOTE — Telephone Encounter (Signed)
Called daughter [name]. No Answer. Left msg to call back.

## 2020-09-01 NOTE — Telephone Encounter (Signed)
Significant stress on kidneys from her last labs, would tolerate some swelling and weight gain as long as not having SOB   Zandra Abts MD

## 2020-09-01 NOTE — Telephone Encounter (Signed)
Spoke to daughter, [name]. Patients weight has dropped back down to 158 lbs on 09/01/20. [name] stated patient was not having any shortness of breath or chest pains at the time. Advised her to call back if patient gains 3 lbs overnight or 5 lb in week.

## 2020-09-02 DIAGNOSIS — R0902 Hypoxemia: Secondary | ICD-10-CM | POA: Diagnosis not present

## 2020-09-06 DIAGNOSIS — Z79899 Other long term (current) drug therapy: Secondary | ICD-10-CM | POA: Diagnosis not present

## 2020-09-07 LAB — BASIC METABOLIC PANEL
BUN/Creatinine Ratio: 17 (ref 12–28)
BUN: 21 mg/dL (ref 10–36)
CO2: 26 mmol/L (ref 20–29)
Calcium: 9.7 mg/dL (ref 8.7–10.3)
Chloride: 101 mmol/L (ref 96–106)
Creatinine, Ser: 1.21 mg/dL — ABNORMAL HIGH (ref 0.57–1.00)
GFR calc Af Amer: 45 mL/min/{1.73_m2} — ABNORMAL LOW (ref >59)
GFR calc non Af Amer: 39 mL/min/{1.73_m2} — ABNORMAL LOW (ref >59)
Glucose: 100 mg/dL — ABNORMAL HIGH (ref 65–99)
Potassium: 4.3 mmol/L (ref 3.5–5.2)
Sodium: 140 mmol/L (ref 134–144)

## 2020-09-10 ENCOUNTER — Telehealth: Payer: Self-pay | Admitting: Cardiology

## 2020-09-10 NOTE — Telephone Encounter (Signed)
I spoke with daughter, they will stay on prescribed meds and call if there is a 3 lbs or more weight gain.

## 2020-09-10 NOTE — Telephone Encounter (Signed)
Yes stick with this regimen, should she gain 3 pounds or more please call us.  Zandra Abts MD

## 2020-09-10 NOTE — Telephone Encounter (Signed)
New message    Patient daughter called in to report that [name] weight is remaining at 159-160 on the medication that you currently have her on, and her kidney function seems to be improving , should they continue with the medication regimen that you have her at right now?

## 2020-09-10 NOTE — Telephone Encounter (Signed)
Currently taking torsemide 20 mg daily   BMET done on 09/06/20 BUN 39,    Creat 1.21    (1.99 on 08/16/20)

## 2020-09-22 ENCOUNTER — Other Ambulatory Visit: Payer: Self-pay | Admitting: Family Medicine

## 2020-10-03 DIAGNOSIS — R0902 Hypoxemia: Secondary | ICD-10-CM | POA: Diagnosis not present

## 2020-10-04 ENCOUNTER — Ambulatory Visit: Payer: Medicare Other | Admitting: Physician Assistant

## 2020-10-06 ENCOUNTER — Ambulatory Visit (INDEPENDENT_AMBULATORY_CARE_PROVIDER_SITE_OTHER): Payer: Medicare Other | Admitting: *Deleted

## 2020-10-06 ENCOUNTER — Other Ambulatory Visit: Payer: Self-pay

## 2020-10-06 ENCOUNTER — Telehealth: Payer: Self-pay

## 2020-10-06 DIAGNOSIS — I480 Paroxysmal atrial fibrillation: Secondary | ICD-10-CM | POA: Diagnosis not present

## 2020-10-06 DIAGNOSIS — Z5181 Encounter for therapeutic drug level monitoring: Secondary | ICD-10-CM | POA: Diagnosis not present

## 2020-10-06 LAB — POCT INR: INR: 1.6 — AB (ref 2.0–3.0)

## 2020-10-06 MED ORDER — OMEPRAZOLE 40 MG PO CPDR
DELAYED_RELEASE_CAPSULE | ORAL | 0 refills | Status: DC
Start: 2020-10-06 — End: 2020-11-08

## 2020-10-06 NOTE — Telephone Encounter (Signed)
omeprazole (PRILOSEC) 40 MG capsule Please in to pharmacy

## 2020-10-06 NOTE — Telephone Encounter (Signed)
Rx sent in

## 2020-10-06 NOTE — Patient Instructions (Signed)
Take warfarin 1 tablet tonight and tomorrow night then resume 1/2 tablet daily except 1 tablet on Wednesdays   Recheck in 3 weeks Continue greens

## 2020-10-07 ENCOUNTER — Encounter: Payer: Self-pay | Admitting: Student

## 2020-10-07 ENCOUNTER — Ambulatory Visit: Payer: Medicare Other | Admitting: Student

## 2020-10-07 ENCOUNTER — Other Ambulatory Visit: Payer: Self-pay

## 2020-10-07 VITALS — BP 132/70 | HR 84 | Ht 66.0 in | Wt 157.6 lb

## 2020-10-07 DIAGNOSIS — I480 Paroxysmal atrial fibrillation: Secondary | ICD-10-CM

## 2020-10-07 DIAGNOSIS — N1832 Chronic kidney disease, stage 3b: Secondary | ICD-10-CM | POA: Diagnosis not present

## 2020-10-07 DIAGNOSIS — I10 Essential (primary) hypertension: Secondary | ICD-10-CM

## 2020-10-07 DIAGNOSIS — R001 Bradycardia, unspecified: Secondary | ICD-10-CM | POA: Diagnosis not present

## 2020-10-07 DIAGNOSIS — R600 Localized edema: Secondary | ICD-10-CM

## 2020-10-07 NOTE — Patient Instructions (Signed)
Medication Instructions:  You make take an extra Torsemide as needed for weight gain.   Your physician recommends that you continue on your current medications as directed. Please refer to the Current Medication list given to you today.  *If you need a refill on your cardiac medications before your next appointment, please call your pharmacy*   Lab Work: None Today If you have labs (blood work) drawn today and your tests are completely normal, you will receive your results only by: Marland Kitchen MyChart Message (if you have MyChart) OR . A paper copy in the mail If you have any lab test that is abnormal or we need to change your treatment, we will call you to review the results.   Testing/Procedures: None Today   Follow-Up: At [facility], you and your health needs are our priority.  As part of our continuing mission to provide you with exceptional heart care, we have created designated Provider Care Teams.  These Care Teams include your primary Cardiologist (physician) and Advanced Practice Providers (APPs -  Physician Assistants and Nurse Practitioners) who all work together to provide you with the care you need, when you need it.  We recommend signing up for the patient portal called "MyChart".  Sign up information is provided on this After Visit Summary.  MyChart is used to connect with patients for Virtual Visits (Telemedicine).  Patients are able to view lab/test results, encounter notes, upcoming appointments, etc.  Non-urgent messages can be sent to your provider as well.   To learn more about what you can do with MyChart, go to [email or web address].    Your next appointment:   2 month(s)  The format for your next appointment:   In Person  Provider:   Carlyle Dolly, MD or Bernerd Pho, PA-C   Other Instructions None Today

## 2020-10-07 NOTE — Progress Notes (Signed)
Cardiology Office Note    Date:  [id number]   ID:  Addelina, Guelker [id number], MRN [id number]  PCP:  Fayrene Helper, MD  Cardiologist: Carlyle Dolly, MD   EP: Dr. Lovena Le  Chief Complaint  Patient presents with  . Follow-up    2 month visit    History of Present Illness:    [name] is a 87 y.o. female with past medical history of paroxysmal atrial fibrillation, symptomatic bradycardia (s/p PPM placement with gen-change in 10/2016), lower extremity edema, HTN, HLD and Stage 3 CKD who presents to the office today for 10-monthfollow-up.   She was last examined by Dr. BHarl Bowiein 07/2020 and reported her palpitations had improved following recent dose adjustment of Lopressor to '125mg'$  BID. She had experienced worsening lower extremity edema and was taking Torsemide '40mg'$  daily, therefore this was increased to '40mg'$  in AM/'20mg'$  in PM. Follow-up labs in 07/2020 showed creatinine had increased from 1.45 to 1.99, therefore Dr. BHarl Bowierecommended holding Torsemide for 3 days then resuming at '20mg'$  daily. Repeat labs on 1/10 showed creatinine had improved to 1.21, therefore it was recommended she continue her current regimen.   In talking with the patient and her daughter today, she reports overall doing well since her last visit. She does have dyspnea on exertion which has been stable. Uses a walker for ambulation. Denies any associated chest pain or palpitations. No recent orthopnea or PND. Her lower extremity edema has improved since her last visit per their report and her weight has been stable on her home scales. She is currently taking Torsemide 20 mg daily.   Past Medical History:  Diagnosis Date  . Annual physical exam 04/22/2015  . Anxiety   . Arthritis   . Arthritis   . Asthma   . Atrial fibrillation ([facility]   . Bleeding disorder ([facility]   . Gastroesophageal reflux disease   . Hearing impairment   . Heart disease   . Heart murmur   . High cholesterol   . Hyperlipidemia    . Hypertension   . Hypothyroidism   . Impaired glucose tolerance   . Macular degeneration   . Pancreatitis   . Paroxysmal atrial fibrillation ([place] 2011  . Sick sinus syndrome ([place] 2011   Atrial fibrilation 10/2009; and dual chamber pacemaker in 4/11; normal EF  . Sleep apnea    Nocturnal oxygen therapy  . Zoster 2016    Past Surgical History:  Procedure Laterality Date  . ABDOMINAL HYSTERECTOMY    . CATARACT EXTRACTION     Bilateral  . COLONOSCOPY  2009   Dr. RGala Romney . EYE SURGERY     laser   . PACEMAKER INSERTION  2011   dual chamber   . PPM GENERATOR CHANGEOUT N/A 11/13/2016   Procedure: PPM Generator Changeout;  Surgeon: TEvans Lance MD;  [facility];  Service: Cardiovascular;  Laterality: N/A;  . SALPINGOOPHORECTOMY  1970   right    Current Medications: Outpatient Medications Prior to Visit  Medication Sig Dispense Refill  . Ascorbic Acid (VITAMIN C PO) Take 1 tablet by mouth daily.    . busPIRone (BUSPAR) 5 MG tablet TAKE 1 TABLET BY MOUTH TWICE DAILY 60 tablet 5  . Calcium Carb-Cholecalciferol 500-400 MG-UNIT CHEW Chew 1 tablet by mouth daily.  60 tablet   . Cholecalciferol (VITAMIN D3 PO) Take 2 tablets by mouth daily.    . Cyanocobalamin (VITAMIN B12 PO) Take 1 tablet by mouth daily.    .Marland Kitchen  diltiazem (CARDIZEM CD) 240 MG 24 hr capsule TAKE ONE CAPSULE BY MOUTH ONCE DAILY. 30 capsule 11  . donepezil (ARICEPT) 5 MG tablet TAKE (1) TABLET BY MOUTH AT BEDTIME. 90 tablet 0  . fluticasone (FLONASE) 50 MCG/ACT nasal spray Place 1 spray into both nostrils daily.    Marland Kitchen levothyroxine (SYNTHROID) 112 MCG tablet TAKE 1 TABLET BY MOUTH ONCE DAILY. 90 tablet 0  . loratadine (CLARITIN) 10 MG tablet Take 1 tablet (10 mg total) by mouth daily. 30 tablet 1  . lovastatin (MEVACOR) 40 MG tablet TAKE ONE TABLET BY MOUTH AT BEDTIME. 90 tablet 3  . meclizine (ANTIVERT) 12.5 MG tablet Take 1 tablet (12.5 mg total) by mouth 2 (two) times daily as needed for dizziness.  30 tablet 0  . metoprolol tartrate (LOPRESSOR) 100 MG tablet Take 1 tablet (100 mg total) by mouth 2 (two) times daily. 180 tablet 1  . metoprolol tartrate (LOPRESSOR) 25 MG tablet Take 1 tablet (25 mg total) by mouth 2 (two) times daily. Take this in addition to the '100mg'$  twice a day 180 tablet 3  . Multiple Vitamins-Minerals (EYE VITAMINS & MINERALS PO) Take 1 tablet by mouth daily.    Marland Kitchen omeprazole (PRILOSEC) 40 MG capsule TAKE (1) CAPSULE BY MOUTH ONCE DAILY FOR ACID REFLUX. 30 capsule 0  . OXYGEN-HELIUM IN Inhale 2 L into the lungs at bedtime.    . torsemide (DEMADEX) 20 MG tablet Take 1 tablet (20 mg total) by mouth daily. 90 tablet 3  . warfarin (COUMADIN) 5 MG tablet TAKE 1/2 TABLET BY MOUTH DAILY EXCEPT 1 TABLET ON WEDNESDAYS  OR AS DIRECTED. 25 tablet 6  . zinc gluconate 50 MG tablet Take 50 mg by mouth daily.    Marland Kitchen sulfamethoxazole-trimethoprim (BACTRIM) 400-80 MG tablet Take 1 tablet by mouth 2 (two) times daily. 10 tablet 0   Facility-Administered Medications Prior to Visit  Medication Dose Route Frequency Provider Last Rate Last Admin  . miconazole (MONISTAT 7) 2 % vaginal cream 1 Applicatorful  1 Applicatorful Vaginal QHS Jonnie Kind, MD         Allergies:   Patient has no known allergies.   Social History   Socioeconomic History  . Marital status: Widowed    Spouse name: Not on file  . Number of children: 7  . Years of education: college  . Highest education level: Not on file  Occupational History  . Occupation: retired     Fish farm manager: RETIRED  Tobacco Use  . Smoking status: Never Smoker  . Smokeless tobacco: Never Used  Vaping Use  . Vaping Use: Never used  Substance and Sexual Activity  . Alcohol use: No    Alcohol/week: 0.0 standard drinks  . Drug use: No  . Sexual activity: Not Currently    Birth control/protection: Surgical    Comment: hyst  Other Topics Concern  . Not on file  Social History Narrative  . Not on file   Social Determinants of Health    Financial Resource Strain: Low Risk   . Difficulty of Paying Living Expenses: Not hard at all  Food Insecurity: No Food Insecurity  . Worried About Charity fundraiser in the Last Year: Never true  . Ran Out of Food in the Last Year: Never true  Transportation Needs: No Transportation Needs  . Lack of Transportation (Medical): No  . Lack of Transportation (Non-Medical): No  Physical Activity: Inactive  . Days of Exercise per Week: 0 days  . Minutes of  Exercise per Session: 0 min  Stress: Stress Concern Present  . Feeling of Stress : To some extent  Social Connections: Socially Isolated  . Frequency of Communication with Friends and Family: Once a week  . Frequency of Social Gatherings with Friends and Family: More than three times a week  . Attends Religious Services: Never  . Active Member of Clubs or Organizations: No  . Attends Archivist Meetings: Never  . Marital Status: Widowed     Family History:  The patient's family history includes Arthritis in an other family member; Asthma in an other family member; Cancer in her father and mother; Emphysema in her sister; Heart disease in an other family member; Lung disease in an other family member; Melanoma in her son; Mitral valve prolapse in her sister; Pneumonia in her sister.   Review of Systems:   Please see the history of present illness.     General:  No chills, fever, night sweats or weight changes.  Cardiovascular:  No chest pain, orthopnea, palpitations, paroxysmal nocturnal dyspnea. Positive for dyspnea on exertion and edema.  Dermatological: No rash, lesions/masses Respiratory: No cough.  Urologic: No hematuria, dysuria Abdominal:   No nausea, vomiting, diarrhea, bright red blood per rectum, melena, or hematemesis Neurologic:  No visual changes, wkns, changes in mental status. All other systems reviewed and are otherwise negative except as noted above.   Physical Exam:    VS:  BP 132/70   Pulse 84    Ht '5\' 6"'$  (1.676 m)   Wt 157 lb 9.6 oz (71.5 kg)   SpO2 97%   BMI 25.44 kg/m    General: Pleasant elderly female appearing in no acute distress. Head: Normocephalic, atraumatic. Neck: No carotid bruits. JVD not elevated.  Lungs: Respirations regular and unlabored, without wheezes or rales.  Heart: Irregularly irregular. No S3 or S4.  No murmur, no rubs, or gallops appreciated. Abdomen: Appears non-distended. No obvious abdominal masses. Msk:  Strength and tone appear normal for age. No obvious joint deformities or effusions. Extremities: No clubbing or cyanosis. Trace lower extremity edema bilaterally.  Distal pedal pulses are 2+ bilaterally. Neuro: Alert and oriented X 3. Moves all extremities spontaneously. No focal deficits noted. Psych:  Responds to questions appropriately with a normal affect. Skin: No rashes or lesions noted  Wt Readings from Last 3 Encounters:  10/07/20 157 lb 9.6 oz (71.5 kg)  08/03/20 157 lb (71.2 kg)  08/02/20 158 lb (71.7 kg)     Studies/Labs Reviewed:   EKG:  EKG is not ordered today.    Recent Labs: 06/10/2020: ALT 9; TSH 1.420 06/24/2020: Hemoglobin 10.9; NT-Pro BNP 2,779; Platelets 355 08/16/2020: Magnesium 2.3 09/06/2020: BUN 21; Creatinine, Ser 1.21; Potassium 4.3; Sodium 140   Lipid Panel    Component Value Date/Time   CHOL 144 06/10/2020 1103   TRIG 111 06/10/2020 1103   HDL 59 06/10/2020 1103   CHOLHDL 2.4 06/10/2020 1103   CHOLHDL 2.1 11/04/2019 1027   VLDL 53 (H) 11/22/2016 1005   LDLCALC 65 06/10/2020 1103   LDLCALC 50 11/04/2019 1027    Additional studies/ records that were reviewed today include:   Echocardiogram: 06/2019 IMPRESSIONS    1. Left ventricular ejection fraction, by visual estimation, is 65 to  70%. The left ventricle has normal function. There is mildly increased  left ventricular hypertrophy.  2. Left ventricular diastolic parameters are indeterminate in the setting  of atrial fibrillation.  3. Global  right ventricle has normal systolic function.The right  ventricular size is mildly enlarged. No increase in right ventricular wall  thickness.  4. Left atrial size was upper normal.  5. Right atrial size was upper normal.  6. Mild mitral annular calcification.  7. Moderate aortic valve annular calcification.  8. The mitral valve is grossly normal. Mild mitral valve regurgitation.  9. The tricuspid valve is grossly normal. Tricuspid valve regurgitation  is trivial.  10. The aortic valve is tricuspid. Aortic valve regurgitation is not  visualized. Mild aortic valve sclerosis without stenosis.  11. The pulmonic valve was grossly normal. Pulmonic valve regurgitation is  trivial.  12. A pacer wire is visualized in the RV and RA.  13. The inferior vena cava is normal in size with greater than 50%  respiratory variability, suggesting right atrial pressure of 3 mmHg.  14. Mildly elevated pulmonary artery systolic pressure.  15. The tricuspid regurgitant velocity is 2.71 m/s, and with an assumed  right atrial pressure of 3 mmHg, the estimated right ventricular systolic  pressure is mildly elevated at 32.4 mmHg.   Assessment:    1. Paroxysmal atrial fibrillation (HCC)   2. Symptomatic bradycardia   3. Leg edema   4. Essential hypertension   5. Stage 3b chronic kidney disease ([place])      Plan:   In order of problems listed above:  1. Paroxysmal Atrial Fibrillation - Her HR was initially in the low-100's upon walking back to the room but improved into the 80's on recheck. Continue Cardizem CD '240mg'$  daily and Lopressor '125mg'$  BID for rate-control. - She remains on Coumadin for anticoagulation. Her INR was subtherapeutic recently but her daughter reports she had consumed more greens than normal. Will keep scheduled follow-up with the [facility].   2. Symptomatic Bradycardia - She is s/p PPM placement with gen-change in 10/2016. Device followed by Dr. Lovena Le. Most recent  interrogation in 07/2020 showed normal device function.   3. Lower Extremity Edema - Symptoms have improved since her last visit and her breathing has been at baseline. Weight has also been stable on her home scales. Will continue Torsemide '20mg'$  daily and we reviewed she can take an extra tablet if needed for weight gain > 3 lbs overnight or > 5 lbs in one week. Her family is very diligent about monitoring her sodium intake which has helped as well.   4. HTN - BP is well-controlled at 132/70 during today's visit. Continue current medication regimen.   5. Stage 3 CKD - Creatinine previously peaked at 1.99 with dose adjustment of Torsemide, improved to 1.21 by her most recent labs. Will continue her current diuretic regimen as outlined above.    Medication Adjustments/Labs and Tests Ordered: Current medicines are reviewed at length with the patient today.  Concerns regarding medicines are outlined above.  Medication changes, Labs and Tests ordered today are listed in the Patient Instructions below. Patient Instructions  Medication Instructions:  You make take an extra Torsemide as needed for weight gain.   Your physician recommends that you continue on your current medications as directed. Please refer to the Current Medication list given to you today.  *If you need a refill on your cardiac medications before your next appointment, please call your pharmacy*   Lab Work: None Today If you have labs (blood work) drawn today and your tests are completely normal, you will receive your results only by: Marland Kitchen MyChart Message (if you have MyChart) OR . A paper copy in the mail If you have any lab test  that is abnormal or we need to change your treatment, we will call you to review the results.   Testing/Procedures: None Today   Follow-Up: At [facility], you and your health needs are our priority.  As part of our continuing mission to provide you with exceptional heart care, we have created  designated Provider Care Teams.  These Care Teams include your primary Cardiologist (physician) and Advanced Practice Providers (APPs -  Physician Assistants and Nurse Practitioners) who all work together to provide you with the care you need, when you need it.  We recommend signing up for the patient portal called "MyChart".  Sign up information is provided on this After Visit Summary.  MyChart is used to connect with patients for Virtual Visits (Telemedicine).  Patients are able to view lab/test results, encounter notes, upcoming appointments, etc.  Non-urgent messages can be sent to your provider as well.   To learn more about what you can do with MyChart, go to [email or web address].    Your next appointment:   2 month(s)  The format for your next appointment:   In Person  Provider:   Carlyle Dolly, MD or Bernerd Pho, PA-C   Other Instructions None Today        Signed, Erma Heritage, PA-C  10/07/2020 7:09 PM    [facility] [address] [place], [place] [address] Phone: [phone] Fax: [phone]

## 2020-10-18 ENCOUNTER — Telehealth: Payer: Self-pay | Admitting: Student

## 2020-10-18 NOTE — Telephone Encounter (Signed)
Per phone call from pt's daughter [name]- pt has had a 5lb weight gain since yesterday.   158.8 yesterday this morning was 163.8 today  Legs/ankles are swollen   Please call [phone]

## 2020-10-18 NOTE — Telephone Encounter (Signed)
I spoke with daughter and relayed to her that per B.Strader's office note of 2/822, [name] may take an additional Torsemide 20 mg. Daughter will call in the am with weight.

## 2020-10-19 ENCOUNTER — Telehealth: Payer: Self-pay | Admitting: Cardiology

## 2020-10-19 NOTE — Telephone Encounter (Signed)
I told daughter to take torsemide 20 mg today and continue to watch weight. Today is her 20 rd birthday and is having donuts and cake. Daughter says she may skip weighing her tomorrow!

## 2020-10-19 NOTE — Telephone Encounter (Signed)
New message   Patient daughter [name] is returning call to Cathi about her mothers weight Today [name] is 161lb , yesterday she was 13 , do you want her to continue giving her mother 2 pills or go back to 1?  Please call

## 2020-10-21 ENCOUNTER — Other Ambulatory Visit: Payer: Self-pay

## 2020-10-21 ENCOUNTER — Telehealth (INDEPENDENT_AMBULATORY_CARE_PROVIDER_SITE_OTHER): Payer: Medicare Other | Admitting: Family Medicine

## 2020-10-21 ENCOUNTER — Encounter: Payer: Self-pay | Admitting: Family Medicine

## 2020-10-21 VITALS — BP 101/61 | HR 70 | Ht 66.0 in | Wt 157.0 lb

## 2020-10-21 DIAGNOSIS — G4734 Idiopathic sleep related nonobstructive alveolar hypoventilation: Secondary | ICD-10-CM

## 2020-10-21 DIAGNOSIS — I4891 Unspecified atrial fibrillation: Secondary | ICD-10-CM

## 2020-10-21 DIAGNOSIS — Z9181 History of falling: Secondary | ICD-10-CM

## 2020-10-21 DIAGNOSIS — N390 Urinary tract infection, site not specified: Secondary | ICD-10-CM | POA: Diagnosis not present

## 2020-10-21 DIAGNOSIS — E7849 Other hyperlipidemia: Secondary | ICD-10-CM

## 2020-10-21 DIAGNOSIS — I480 Paroxysmal atrial fibrillation: Secondary | ICD-10-CM | POA: Diagnosis not present

## 2020-10-21 DIAGNOSIS — R413 Other amnesia: Secondary | ICD-10-CM

## 2020-10-21 DIAGNOSIS — E038 Other specified hypothyroidism: Secondary | ICD-10-CM

## 2020-10-21 NOTE — Patient Instructions (Addendum)
F/U in 4 months, call if you need me sooner  TSH and chem 7 and EGFR  Within the next 1 week, non fasting   Urine for c/S only  Increase Aricept 5 mg tablet take TWO every evening starting today Main s/e is diarrhea, if this is a problem stop the higherdose and resume the 5 mg and let me know  If able to tolerate please call back onm March 7 or that week to let me know so I will prescribe the higher dose , aricept 10 mg one at night   Continue to be careful not to fall, and continue I home walking video  Thanks for choosing [facility], we consider it a privelige to serve you.  

## 2020-10-21 NOTE — Progress Notes (Signed)
Virtual Visit via Telephone Note  I connected with [name] on [id number] at 11:00 AM EST by telephone and verified that I am speaking with the correct person using two identifiers.  Location: Patient: home Provider: office   I discussed the limitations, risks, security and privacy concerns of performing an evaluation and management service by telephone and the availability of in person appointments. I also discussed with the patient that there may be a patient responsible charge related to this service. The patient expressed understanding and agreed to proceed.   History of Present Illness: F/U chronic problems, review recent labs or radiologic data Address new concerns Main historian is daughter [name], pt also involved in conversation sporadically Overall keeping well concern today re fluctuating blood pressure and pulse obtained at home Appetite good, doing home exercise and enjoying this, no falls. Appetite fair , bowels move regularly Request for urine to be checked for infection due to her recurrent UTI history   Observations/Objective: BP 101/61   Pulse 70   Ht '5\' 6"'$  (1.676 m)   Wt 157 lb (71.2 kg)   SpO2 99%   BMI 25.34 kg/m   Alert and oriented x 3 No signs of respiratory distress during speech    Assessment and Plan: Impaired memory trila of dose increase on aricept to treating dose of 10 mg daily, daughter will notify if intolerant and if not will change dose in next 12 days  Hyperlipidemia Hyperlipidemia:Low fat diet discussed and encouraged.   Lipid Panel  Lab Results  Component Value Date   CHOL 144 06/10/2020   HDL 59 06/10/2020   LDLCALC 65 06/10/2020   TRIG 111 06/10/2020   CHOLHDL 2.4 06/10/2020  Controlled, no change in medication      Hypothyroidism Adequately corrected , no med change  Nocturnal hypoxia Chronic bedtime oxygen at 2 L/min  Atrial fibrillation with RVR ([place]) Pacemaker dependent and regularly checked by  Cardiology  At high risk for falls Home safety reviewed and she is encouraged to continue her home exeercise program which she is enjoying  Recurrent UTI Specimen to be submitted for c/s only base on pt history also urology reportedly will not do any procedure unless she is infection free    Follow Up Instructions:    I discussed the assessment and treatment plan with the patient. The patient was provided an opportunity to ask questions and all were answered. The patient agreed with the plan and demonstrated an understanding of the instructions.   The patient was advised to call back or seek an in-person evaluation if the symptoms worsen or if the condition fails to improve as anticipated.  I provided 24 minutes of non-face-to-face time during this encounter.   Tula Nakayama, MD

## 2020-10-22 ENCOUNTER — Encounter: Payer: Self-pay | Admitting: Family Medicine

## 2020-10-22 NOTE — Assessment & Plan Note (Signed)
Chronic bedtime oxygen at 2 L/min

## 2020-10-22 NOTE — Assessment & Plan Note (Signed)
trila of dose increase on aricept to treating dose of 10 mg daily, daughter will notify if intolerant and if not will change dose in next 12 days

## 2020-10-22 NOTE — Assessment & Plan Note (Signed)
Adequately corrected , no med change

## 2020-10-22 NOTE — Assessment & Plan Note (Signed)
Specimen to be submitted for c/s only base on pt history also urology reportedly will not do any procedure unless she is infection free

## 2020-10-22 NOTE — Assessment & Plan Note (Signed)
Hyperlipidemia:Low fat diet discussed and encouraged.   Lipid Panel  Lab Results  Component Value Date   CHOL 144 06/10/2020   HDL 59 06/10/2020   LDLCALC 65 06/10/2020   TRIG 111 06/10/2020   CHOLHDL 2.4 06/10/2020  Controlled, no change in medication

## 2020-10-22 NOTE — Assessment & Plan Note (Signed)
Pacemaker dependent and regularly checked by Cardiology

## 2020-10-22 NOTE — Assessment & Plan Note (Signed)
Home safety reviewed and she is encouraged to continue her home exeercise program which she is enjoying

## 2020-10-27 ENCOUNTER — Ambulatory Visit (INDEPENDENT_AMBULATORY_CARE_PROVIDER_SITE_OTHER): Payer: Medicare Other | Admitting: *Deleted

## 2020-10-27 DIAGNOSIS — Z5181 Encounter for therapeutic drug level monitoring: Secondary | ICD-10-CM

## 2020-10-27 DIAGNOSIS — I480 Paroxysmal atrial fibrillation: Secondary | ICD-10-CM

## 2020-10-27 DIAGNOSIS — N390 Urinary tract infection, site not specified: Secondary | ICD-10-CM | POA: Diagnosis not present

## 2020-10-27 DIAGNOSIS — E038 Other specified hypothyroidism: Secondary | ICD-10-CM | POA: Diagnosis not present

## 2020-10-27 LAB — POCT INR: INR: 1.9 — AB (ref 2.0–3.0)

## 2020-10-27 NOTE — Patient Instructions (Signed)
Increase warfarin to 1/2 tablet daily except 1 tablet on Wednesdays and Saturdays Recheck in 3 weeks Continue greens

## 2020-10-28 ENCOUNTER — Ambulatory Visit (INDEPENDENT_AMBULATORY_CARE_PROVIDER_SITE_OTHER): Payer: Medicare Other

## 2020-10-28 DIAGNOSIS — I495 Sick sinus syndrome: Secondary | ICD-10-CM | POA: Diagnosis not present

## 2020-10-28 LAB — BMP8+EGFR
BUN/Creatinine Ratio: 20 (ref 12–28)
BUN: 26 mg/dL (ref 10–36)
CO2: 24 mmol/L (ref 20–29)
Calcium: 9.4 mg/dL (ref 8.7–10.3)
Chloride: 101 mmol/L (ref 96–106)
Creatinine, Ser: 1.3 mg/dL — ABNORMAL HIGH (ref 0.57–1.00)
Glucose: 98 mg/dL (ref 65–99)
Potassium: 4.4 mmol/L (ref 3.5–5.2)
Sodium: 143 mmol/L (ref 134–144)
eGFR: 38 mL/min/{1.73_m2} — ABNORMAL LOW (ref >59)

## 2020-10-28 LAB — TSH: TSH: 3.41 u[IU]/mL (ref 0.450–4.500)

## 2020-10-29 ENCOUNTER — Telehealth: Payer: Self-pay | Admitting: Student

## 2020-10-29 NOTE — Telephone Encounter (Signed)
Pt has gained 4-5lbs this week. She's currently staying at 165lbs.   Please call [phone]  Please let pt's daughter know if she needs to increase medication, she's going to the bathroom regularly

## 2020-10-29 NOTE — Telephone Encounter (Signed)
Spoke with daughter regarding pt's weight. Daughter state that pt was 160 lb on the 3/1 and pt is currently at 164.4lb and yesterday it was 165.6lb. Does have increased SOB when moving around. Denies chest pain. Please advise.

## 2020-10-30 LAB — URINE CULTURE

## 2020-10-31 DIAGNOSIS — R0902 Hypoxemia: Secondary | ICD-10-CM | POA: Diagnosis not present

## 2020-11-01 LAB — CUP PACEART REMOTE DEVICE CHECK
Battery Remaining Longevity: 115 mo
Battery Remaining Percentage: 95.5 %
Battery Voltage: 3.01 V
Brady Statistic RV Percent Paced: 61 %
Date Time Interrogation Session: 20220303020012
Implantable Lead Implant Date: 20110422
Implantable Lead Implant Date: 20110422
Implantable Lead Location: 753859
Implantable Lead Location: 753860
Implantable Pulse Generator Implant Date: 20180319
Lead Channel Impedance Value: 380 Ohm
Lead Channel Pacing Threshold Amplitude: 1 V
Lead Channel Pacing Threshold Pulse Width: 0.5 ms
Lead Channel Sensing Intrinsic Amplitude: 4.5 mV
Lead Channel Setting Pacing Amplitude: 2 V
Lead Channel Setting Pacing Pulse Width: 0.5 ms
Lead Channel Setting Sensing Sensitivity: 1 mV
Pulse Gen Model: 2272
Pulse Gen Serial Number: 8010902

## 2020-11-01 NOTE — Telephone Encounter (Signed)
Patient daughter called today [id number] states that [name] weight was [id number] yesterday and she was having trouble with SOB, they gave her 2 fluid pills yesterday and today Xana weight is [id number] .  Dr Moshe Cipro did BMET last Wednesday , notes are in [place] . Please advise on how to proceed

## 2020-11-01 NOTE — Telephone Encounter (Signed)
Spoke to patient's daughter who verbalized understanding and will call back with updates on Wednesday.

## 2020-11-01 NOTE — Telephone Encounter (Signed)
Labs look fine. Last weight in our clinic was 157, if still runing above that would take extra tablet of her torsemide additional 2 days and update [place]  Zandra Abts MD

## 2020-11-03 ENCOUNTER — Telehealth: Payer: Self-pay | Admitting: *Deleted

## 2020-11-03 NOTE — Telephone Encounter (Signed)
Pt's daughter notified and voiced understanding that she will not take extra torsemide unless weight gain of 3 lbs overnight or swelling. Pt will resume Torsemide 20 mg daily

## 2020-11-03 NOTE — Telephone Encounter (Signed)
Would go back to torsemide '20mg'$  daily, may take additional '20mg'$  for weight gain over 3 pounds or swelling  J BranchMD

## 2020-11-03 NOTE — Telephone Encounter (Signed)
Spoke with daughter she states weight on yesterday was 160 lbs and today was 162. 2 lb. No increase SOB at this time. Please advise.

## 2020-11-05 ENCOUNTER — Other Ambulatory Visit: Payer: Self-pay | Admitting: Family Medicine

## 2020-11-08 NOTE — Progress Notes (Signed)
Remote pacemaker transmission.   

## 2020-11-16 ENCOUNTER — Ambulatory Visit: Payer: Medicare Other | Admitting: Cardiology

## 2020-11-22 ENCOUNTER — Ambulatory Visit (INDEPENDENT_AMBULATORY_CARE_PROVIDER_SITE_OTHER): Payer: Medicare Other | Admitting: *Deleted

## 2020-11-22 ENCOUNTER — Other Ambulatory Visit: Payer: Self-pay

## 2020-11-22 DIAGNOSIS — I480 Paroxysmal atrial fibrillation: Secondary | ICD-10-CM | POA: Diagnosis not present

## 2020-11-22 DIAGNOSIS — Z5181 Encounter for therapeutic drug level monitoring: Secondary | ICD-10-CM | POA: Diagnosis not present

## 2020-11-22 LAB — POCT INR: INR: 3 (ref 2.0–3.0)

## 2020-11-22 NOTE — Patient Instructions (Signed)
Continue warfarin 1/2 tablet daily except 1 tablet on Wednesdays and Saturdays Recheck in 4 weeks Increase greens

## 2020-11-23 ENCOUNTER — Telehealth: Payer: Self-pay

## 2020-11-23 ENCOUNTER — Other Ambulatory Visit: Payer: Self-pay

## 2020-11-23 MED ORDER — DONEPEZIL HCL 10 MG PO TABS: 10.0000 mg | ORAL_TABLET | Freq: Every day | ORAL | 1 refills | Status: DC

## 2020-11-23 NOTE — Telephone Encounter (Signed)
Per last office visit changed to '10mg'$ 

## 2020-11-23 NOTE — Telephone Encounter (Signed)
Patient daughter [name]) called patient need refill on meds.  Needs 10 mg instead of the 5 mg as discuss last visit.  Patient says it working really well when taking 2 tabs.  Patient requesting 10 mg instead of 5 mg. Cb# [phone].  donepezil (ARICEPT)  Pharmacy:  [facility]

## 2020-12-01 DIAGNOSIS — R0902 Hypoxemia: Secondary | ICD-10-CM | POA: Diagnosis not present

## 2020-12-02 ENCOUNTER — Telehealth: Payer: Self-pay | Admitting: Cardiology

## 2020-12-02 NOTE — Telephone Encounter (Signed)
New Message    Per daughter patient has a wet cough and the patient thinks its her copd , her weight is staying around 162.  She is very tired and seems like she is giving up and they would like to speak to someone about her condition do they need to bring her in sooner.

## 2020-12-02 NOTE — Telephone Encounter (Signed)
Daughter states that pt has wet cough over the pass 2 days. Pt states that her COPD is acting up. Daughter reports that she does have some SOB. Weights are as follows: 4/3 165.8 lbs, 4/4 165.6 lbs, 4/5 163.8 lbs, 4/6 162.2lbs, 4/7 162.2 lbs. Pt is complaint with low sodium diet. Daughter states that pt has swelling in belly, ankles, and legs. Pt has appt on Wednesday with B.Strader. Please advise.

## 2020-12-03 NOTE — Telephone Encounter (Signed)
Looks like goal weight close to 160, so perhaps slightly up. Could double on diuretic x 2 days then resume prior dose(2 pills in the AM). Update [place] on Monday.   Zandra Abts MD

## 2020-12-03 NOTE — Telephone Encounter (Signed)
Pt's daughter [name] notified and voiced understanding.

## 2020-12-08 ENCOUNTER — Encounter: Payer: Self-pay | Admitting: Student

## 2020-12-08 ENCOUNTER — Ambulatory Visit: Payer: Medicare Other | Admitting: Student

## 2020-12-08 ENCOUNTER — Other Ambulatory Visit: Payer: Self-pay

## 2020-12-08 VITALS — BP 116/62 | HR 63 | Ht 65.0 in | Wt 163.1 lb

## 2020-12-08 DIAGNOSIS — Z79899 Other long term (current) drug therapy: Secondary | ICD-10-CM | POA: Diagnosis not present

## 2020-12-08 DIAGNOSIS — N1832 Chronic kidney disease, stage 3b: Secondary | ICD-10-CM | POA: Diagnosis not present

## 2020-12-08 DIAGNOSIS — R001 Bradycardia, unspecified: Secondary | ICD-10-CM | POA: Diagnosis not present

## 2020-12-08 DIAGNOSIS — I480 Paroxysmal atrial fibrillation: Secondary | ICD-10-CM | POA: Diagnosis not present

## 2020-12-08 DIAGNOSIS — R600 Localized edema: Secondary | ICD-10-CM

## 2020-12-08 MED ORDER — TORSEMIDE 20 MG PO TABS: ORAL_TABLET | ORAL | 3 refills | Status: DC

## 2020-12-08 NOTE — Patient Instructions (Signed)
Medication Instructions:  Your physician has recommended you make the following change in your medication: INCREASE Torsemide to 40 mg every other day alternating with Torsemide 20 mg.  Ex: Monday- '40mg'$ ; Tuesday- '20mg'$ ; Wednesday- '40mg'$ , etc.  *If you need a refill on your cardiac medications before your next appointment, please call your pharmacy*   Lab Work: BMET in two (2) weeks If you have labs (blood work) drawn today and your tests are completely normal, you will receive your results only by: Marland Kitchen MyChart Message (if you have MyChart) OR . A paper copy in the mail If you have any lab test that is abnormal or we need to change your treatment, we will call you to review the results.   Testing/Procedures: None   Follow-Up: At [facility], you and your health needs are our priority.  As part of our continuing mission to provide you with exceptional heart care, we have created designated Provider Care Teams.  These Care Teams include your primary Cardiologist (physician) and Advanced Practice Providers (APPs -  Physician Assistants and Nurse Practitioners) who all work together to provide you with the care you need, when you need it.  We recommend signing up for the patient portal called "MyChart".  Sign up information is provided on this After Visit Summary.  MyChart is used to connect with patients for Virtual Visits (Telemedicine).  Patients are able to view lab/test results, encounter notes, upcoming appointments, etc.  Non-urgent messages can be sent to your provider as well.   To learn more about what you can do with MyChart, go to [email or web address].    Your next appointment:   3 month(s)  The format for your next appointment:   In Person  Provider:   Carlyle Dolly, MD   Other Instructions

## 2020-12-08 NOTE — Progress Notes (Signed)
Cardiology Office Note    Date:  [id number]   ID:  AVANTE BRACK, DOB [id number], MRN [id number]  PCP:  Fayrene Helper, MD  Cardiologist: Carlyle Dolly, MD   EP: Dr. Lovena Le  Chief Complaint  Patient presents with  . Follow-up    2 month visit    History of Present Illness:    [name] is a 87 y.o. female with past medical history of paroxysmal atrial fibrillation, symptomatic bradycardia (s/p PPM placement with gen-change in 10/2016), lower extremity edema, HTN, HLD and Stage 3 CKD who presents to the office today for 65-monthfollow-up.   She was examined by myself in 09/2020 and reported having stable dyspnea on exertion but denied any acute changes in this.  Her edema had improved since her prior visit and she was continued on Torsemide 20 mg daily.  Her daughter did reach out to the office in 10/2020 for a slight weight gain and was encouraged to take an extra Torsemide if needed. She most recently reached out on 12/02/2020 reporting her mother was experiencing worsening dyspnea and cough along with edema and weight had been variable from 162 to 165 lbs. Given her weight gain, Dr. BHarl Bowiedid recommended she take extra Torsemide tablets for 2 days then resume her normal dose.   In talking with the patient and her daughter today, her weight has been fluctuating by 5 pounds on her home scales over the past month and she has been utilizing extra Torsemide more frequently. The patient reports feeling more fatigued and having more dyspnea and a wet cough when her weight is at 165 -166 lbs. Feels better at 160 - 161 lbs. She denies any recent chest pain or palpitations.  Reports good compliance with Coumadin and no evidence of active bleeding.   Past Medical History:  Diagnosis Date  . Annual physical exam 04/22/2015  . Anxiety   . Arthritis   . Arthritis   . Asthma   . Atrial fibrillation ([place]   . Bleeding disorder ([place]   . Gastroesophageal reflux disease   . Hearing  impairment   . Heart disease   . Heart murmur   . High cholesterol   . Hyperlipidemia   . Hypertension   . Hypothyroidism   . Impaired glucose tolerance   . Macular degeneration   . Pancreatitis   . Paroxysmal atrial fibrillation ([place] 2011  . Sick sinus syndrome ([place] 2011   Atrial fibrilation 10/2009; and dual chamber pacemaker in 4/11; normal EF  . Sleep apnea    Nocturnal oxygen therapy  . Zoster 2016    Past Surgical History:  Procedure Laterality Date  . ABDOMINAL HYSTERECTOMY    . CATARACT EXTRACTION     Bilateral  . COLONOSCOPY  2009   Dr. RGala Romney . EYE SURGERY     laser   . PACEMAKER INSERTION  2011   dual chamber   . PPM GENERATOR CHANGEOUT N/A 11/13/2016   Procedure: PPM Generator Changeout;  Surgeon: TEvans Lance MD;  [facility];  Service: Cardiovascular;  Laterality: N/A;  . SALPINGOOPHORECTOMY  1970   right    Current Medications: Outpatient Medications Prior to Visit  Medication Sig Dispense Refill  . Ascorbic Acid (VITAMIN C PO) Take 1 tablet by mouth daily.    . busPIRone (BUSPAR) 5 MG tablet TAKE 1 TABLET BY MOUTH TWICE DAILY 60 tablet 5  . Calcium Carb-Cholecalciferol 500-400 MG-UNIT CHEW Chew 1 tablet by mouth daily.  60 tablet   . Cholecalciferol (VITAMIN D3 PO) Take 2 tablets by mouth daily.    . Cyanocobalamin (VITAMIN B12 PO) Take 1 tablet by mouth daily.    Marland Kitchen diltiazem (CARDIZEM CD) 240 MG 24 hr capsule TAKE ONE CAPSULE BY MOUTH ONCE DAILY. 30 capsule 11  . donepezil (ARICEPT) 10 MG tablet Take 1 tablet (10 mg total) by mouth at bedtime. 90 tablet 1  . fluticasone (FLONASE) 50 MCG/ACT nasal spray Place 1 spray into both nostrils daily.    Marland Kitchen levothyroxine (SYNTHROID) 112 MCG tablet TAKE 1 TABLET BY MOUTH ONCE DAILY. 90 tablet 0  . loratadine (CLARITIN) 10 MG tablet Take 1 tablet (10 mg total) by mouth daily. 30 tablet 1  . lovastatin (MEVACOR) 40 MG tablet TAKE ONE TABLET BY MOUTH AT BEDTIME. 90 tablet 3  . meclizine  (ANTIVERT) 12.5 MG tablet Take 1 tablet (12.5 mg total) by mouth 2 (two) times daily as needed for dizziness. 30 tablet 0  . metoprolol tartrate (LOPRESSOR) 100 MG tablet Take 1 tablet (100 mg total) by mouth 2 (two) times daily. 180 tablet 1  . metoprolol tartrate (LOPRESSOR) 25 MG tablet Take 1 tablet (25 mg total) by mouth 2 (two) times daily. Take this in addition to the '100mg'$  twice a day 180 tablet 3  . Multiple Vitamins-Minerals (EYE VITAMINS & MINERALS PO) Take 1 tablet by mouth daily.    Marland Kitchen omeprazole (PRILOSEC) 40 MG capsule TAKE (1) CAPSULE BY MOUTH ONCE DAILY FOR ACID REFLUX. 30 capsule 0  . OXYGEN-HELIUM IN Inhale 2 L into the lungs at bedtime.    Marland Kitchen warfarin (COUMADIN) 5 MG tablet TAKE 1/2 TABLET BY MOUTH DAILY EXCEPT 1 TABLET ON WEDNESDAYS  OR AS DIRECTED. 25 tablet 6  . zinc gluconate 50 MG tablet Take 50 mg by mouth daily.    Marland Kitchen torsemide (DEMADEX) 20 MG tablet Take 1 tablet (20 mg total) by mouth daily. 90 tablet 3   Facility-Administered Medications Prior to Visit  Medication Dose Route Frequency Provider Last Rate Last Admin  . miconazole (MONISTAT 7) 2 % vaginal cream 1 Applicatorful  1 Applicatorful Vaginal QHS Jonnie Kind, MD         Allergies:   Patient has no known allergies.   Social History   Socioeconomic History  . Marital status: Widowed    Spouse name: Not on file  . Number of children: 7  . Years of education: college  . Highest education level: Not on file  Occupational History  . Occupation: retired     Fish farm manager: RETIRED  Tobacco Use  . Smoking status: Never Smoker  . Smokeless tobacco: Never Used  Vaping Use  . Vaping Use: Never used  Substance and Sexual Activity  . Alcohol use: No    Alcohol/week: 0.0 standard drinks  . Drug use: No  . Sexual activity: Not Currently    Birth control/protection: Surgical    Comment: hyst  Other Topics Concern  . Not on file  Social History Narrative  . Not on file   Social Determinants of Health    Financial Resource Strain: Low Risk   . Difficulty of Paying Living Expenses: Not hard at all  Food Insecurity: No Food Insecurity  . Worried About Charity fundraiser in the Last Year: Never true  . Ran Out of Food in the Last Year: Never true  Transportation Needs: No Transportation Needs  . Lack of Transportation (Medical): No  . Lack of Transportation (Non-Medical): No  Physical Activity: Inactive  . Days of Exercise per Week: 0 days  . Minutes of Exercise per Session: 0 min  Stress: Stress Concern Present  . Feeling of Stress : To some extent  Social Connections: Socially Isolated  . Frequency of Communication with Friends and Family: Once a week  . Frequency of Social Gatherings with Friends and Family: More than three times a week  . Attends Religious Services: Never  . Active Member of Clubs or Organizations: No  . Attends Archivist Meetings: Never  . Marital Status: Widowed     Family History:  The patient's family history includes Arthritis in an other family member; Asthma in an other family member; Cancer in her father and mother; Emphysema in her sister; Heart disease in an other family member; Lung disease in an other family member; Melanoma in her son; Mitral valve prolapse in her sister; Pneumonia in her sister.   Review of Systems:   Please see the history of present illness.     General:  No chills, fever, night sweats or weight changes.  Cardiovascular:  No chest pain, orthopnea, palpitations, paroxysmal nocturnal dyspnea. Positive for dyspnea on exertion and edema.  Dermatological: No rash, lesions/masses Respiratory: No cough, dyspnea Urologic: No hematuria, dysuria Abdominal:   No nausea, vomiting, diarrhea, bright red blood per rectum, melena, or hematemesis Neurologic:  No visual changes, wkns, changes in mental status. All other systems reviewed and are otherwise negative except as noted above.   Physical Exam:    VS:  BP 116/62   Pulse  63   Ht '5\' 5"'$  (1.651 m)   Wt 163 lb 1.6 oz (74 kg)   SpO2 98%   BMI 27.14 kg/m    General: Well developed, elderly female appearing in no acute distress. Head: Normocephalic, atraumatic. Neck: No carotid bruits. JVD not elevated.  Lungs: Respirations regular and unlabored, without wheezes or rales.  Heart: Irregularly irregular. No S3 or S4.  No murmur, no rubs, or gallops appreciated. Abdomen: Appears non-distended. No obvious abdominal masses. Msk:  Strength and tone appear normal for age. No obvious joint deformities or effusions. Extremities: No clubbing or cyanosis. Trace lower extremity edema.  Distal pedal pulses are 2+ bilaterally. Neuro: Alert and oriented X 3. Moves all extremities spontaneously. No focal deficits noted. Psych:  Responds to questions appropriately with a normal affect. Skin: No rashes or lesions noted  Wt Readings from Last 3 Encounters:  12/08/20 163 lb 1.6 oz (74 kg)  10/21/20 157 lb (71.2 kg)  10/07/20 157 lb 9.6 oz (71.5 kg)     Studies/Labs Reviewed:   EKG:  EKG is not ordered today.   Recent Labs: 06/10/2020: ALT 9 06/24/2020: Hemoglobin 10.9; NT-Pro BNP 2,779; Platelets 355 08/16/2020: Magnesium 2.3 10/27/2020: BUN 26; Creatinine, Ser 1.30; Potassium 4.4; Sodium 143; TSH 3.410   Lipid Panel    Component Value Date/Time   CHOL 144 06/10/2020 1103   TRIG 111 06/10/2020 1103   HDL 59 06/10/2020 1103   CHOLHDL 2.4 06/10/2020 1103   CHOLHDL 2.1 11/04/2019 1027   VLDL 53 (H) 11/22/2016 1005   LDLCALC 65 06/10/2020 1103   LDLCALC 50 11/04/2019 1027    Additional studies/ records that were reviewed today include:   Echocardiogram: 06/2020 IMPRESSIONS    1. Left ventricular ejection fraction, by estimation, is 55 to 60%. The  left ventricle has normal function. The left ventricle has no regional  wall motion abnormalities. Left ventricular diastolic parameters are  indeterminate.  2. Right ventricular systolic function is low normal.  The right  ventricular size is mildly enlarged. There is normal pulmonary artery  systolic pressure.  3. Left atrial size was mildly dilated.  4. Right atrial size was mild to moderately dilated.  5. The mitral valve is normal in structure. Mild mitral valve  regurgitation. No evidence of mitral stenosis.  6. Tricuspid valve regurgitation is mild to moderate.  7. The aortic valve is tricuspid. There is moderate calcification of the  aortic valve. There is moderate thickening of the aortic valve. Aortic  valve regurgitation is not visualized. No aortic stenosis is present.    Assessment:    1. Leg edema   2. Paroxysmal atrial fibrillation (HCC)   3. Medication management   4. Symptomatic bradycardia   5. Stage 3b chronic kidney disease ([place])      Plan:   In order of problems listed above:  1. Lower Extremity Edema - She continues to experience fluctuations in her weight and appears to have significant symptoms with a weight gain of more than 3 pounds as she feels best at 160 - 161 lbs and starts to experience more dyspnea, lower extremity edema and a cough if weight trends above 165 lbs. Given the frequent use of extra Torsemide, I recommended that we increase her Torsemide from 20 mg daily to 20 mg daily alternating with 40 mg daily. Reviewed that we may need to ultimately increase to 40 mg every day but will try to make gradual adjustments as she previously had an AKI with taking '40mg'$  in AM/'20mg'$  in PM so will may gradual changes. Repeat BMET in 2 weeks. They are monitoring her sodium and fluid intake.   2.  Paroxysmal Atrial Fibrillation - Rates have overall been well controlled and she denies any recent palpitations. Remains on Lopressor 125 mg twice daily for rate control. - She denies any evidence of active bleeding. Remains on Coumadin for anticoagulation.  3. Symptomatic Bradycardia  - She is s/p PPM placement with gen-change in 10/2016. Followed by Dr. Lovena Le. Device  check in 10/2020 showed normal device function.   4. Stage 3 CKD - Creatinine was stable at 1.30 by labs in 10/2020.  Will recheck a BMET in 2 weeks following dose adjustment of Torsemide.    Medication Adjustments/Labs and Tests Ordered: Current medicines are reviewed at length with the patient today.  Concerns regarding medicines are outlined above.  Medication changes, Labs and Tests ordered today are listed in the Patient Instructions below. Patient Instructions  Medication Instructions:  Your physician has recommended you make the following change in your medication: INCREASE Torsemide to 40 mg every other day alternating with Torsemide 20 mg.  Ex: Monday- '40mg'$ ; Tuesday- '20mg'$ ; Wednesday- '40mg'$ , etc.  *If you need a refill on your cardiac medications before your next appointment, please call your pharmacy*   Lab Work: BMET in two (2) weeks If you have labs (blood work) drawn today and your tests are completely normal, you will receive your results only by: Marland Kitchen MyChart Message (if you have MyChart) OR . A paper copy in the mail If you have any lab test that is abnormal or we need to change your treatment, we will call you to review the results.   Testing/Procedures: None   Follow-Up: At [facility], you and your health needs are our priority.  As part of our continuing mission to provide you with exceptional heart care, we have created designated Provider Care Teams.  These Care  Teams include your primary Cardiologist (physician) and Advanced Practice Providers (APPs -  Physician Assistants and Nurse Practitioners) who all work together to provide you with the care you need, when you need it.  We recommend signing up for the patient portal called "MyChart".  Sign up information is provided on this After Visit Summary.  MyChart is used to connect with patients for Virtual Visits (Telemedicine).  Patients are able to view lab/test results, encounter notes, upcoming appointments, etc.   Non-urgent messages can be sent to your provider as well.   To learn more about what you can do with MyChart, go to [email or web address].    Your next appointment:   3 month(s)  The format for your next appointment:   In Person  Provider:   Carlyle Dolly, MD   Other Instructions       Signed, Erma Heritage, PA-C  12/08/2020 5:31 PM    [facility] [address] [place], [place] [address] Phone: [phone] Fax: [phone]

## 2020-12-14 ENCOUNTER — Other Ambulatory Visit: Payer: Self-pay | Admitting: Family Medicine

## 2020-12-19 IMAGING — DX DG CHEST 1V PORT
1 series · 1 of 1 positions shown · non-contrast
Comparison: 12/20/2016

CLINICAL DATA: Weakness and pain.

EXAM:
PORTABLE CHEST 1 VIEW

[chest ap]
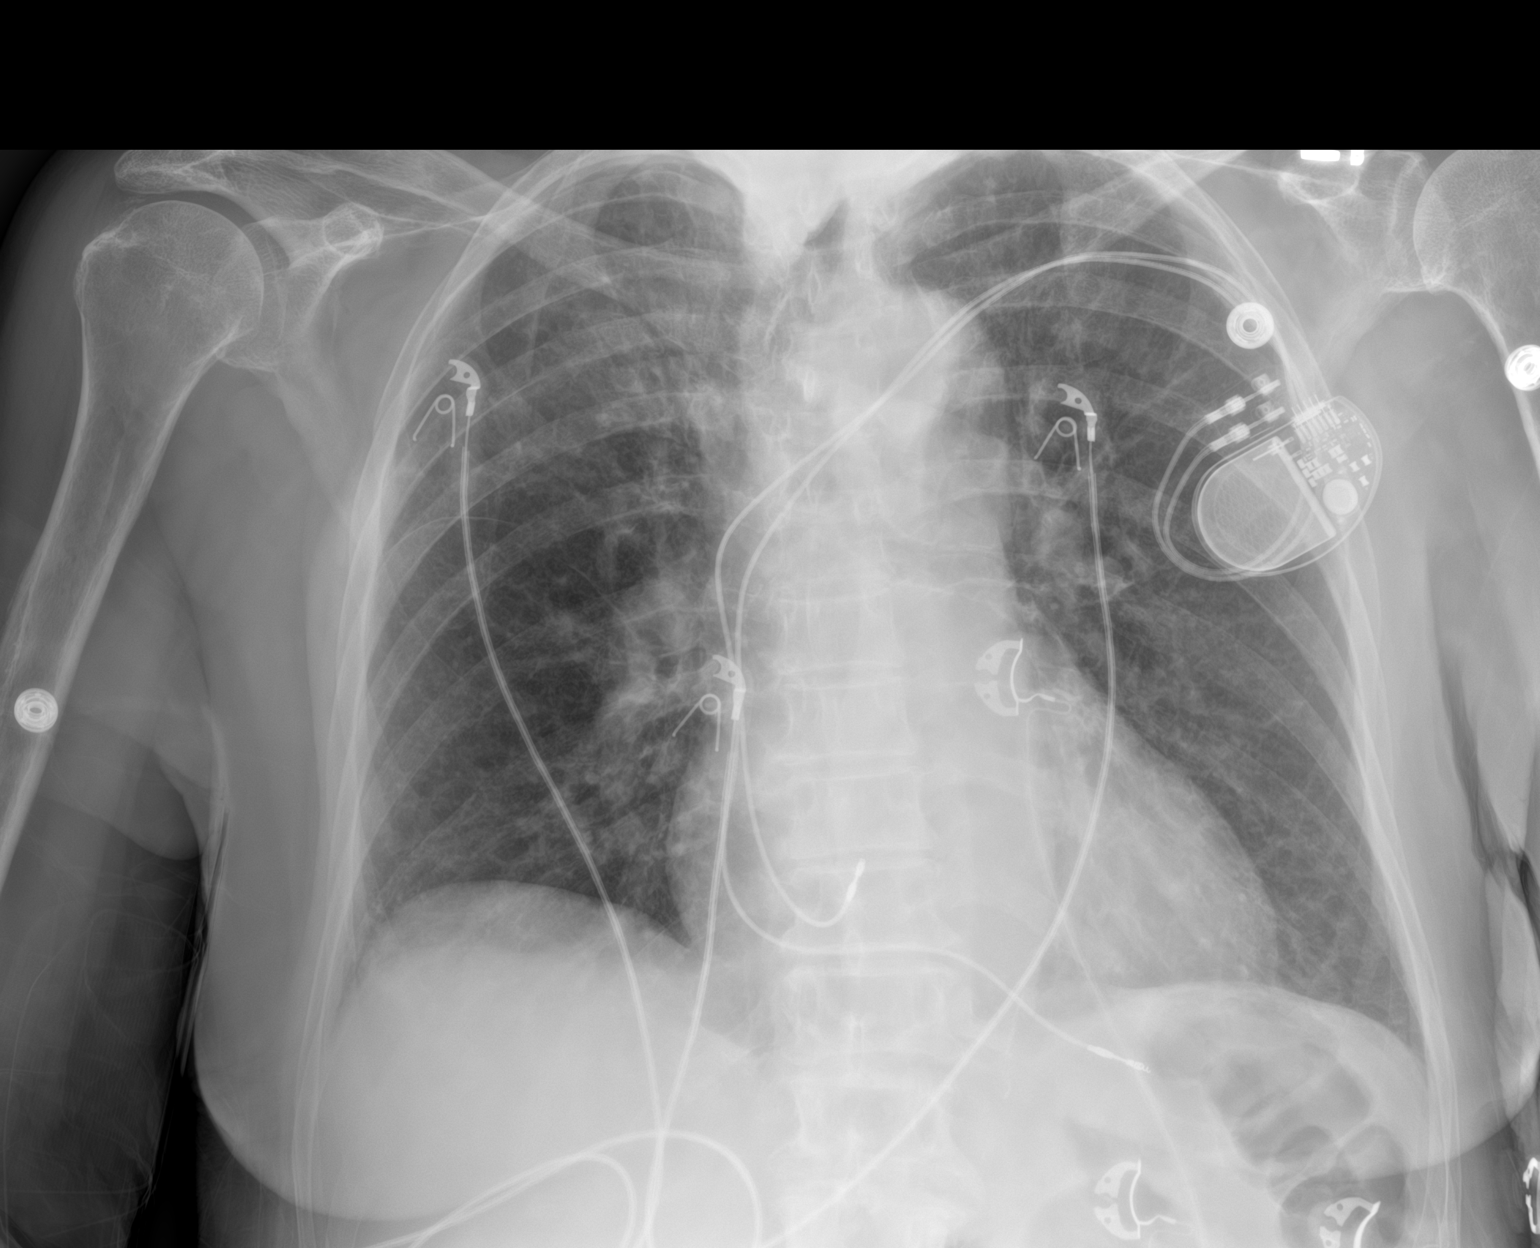

[1 of 1 positions shown; findings below may reference images not displayed]

FINDINGS: Cardiomegaly and LEFT-sided pacemaker again noted.

Mild pulmonary vascular congestion identified.

There is no evidence of focal airspace disease, pulmonary edema,
suspicious pulmonary nodule/mass, pleural effusion, or pneumothorax.

No acute bony abnormalities are identified.
IMPRESSION: Cardiomegaly with mild pulmonary vascular congestion.

## 2020-12-20 ENCOUNTER — Ambulatory Visit (INDEPENDENT_AMBULATORY_CARE_PROVIDER_SITE_OTHER): Payer: Medicare Other | Admitting: *Deleted

## 2020-12-20 DIAGNOSIS — I480 Paroxysmal atrial fibrillation: Secondary | ICD-10-CM | POA: Diagnosis not present

## 2020-12-20 DIAGNOSIS — Z5181 Encounter for therapeutic drug level monitoring: Secondary | ICD-10-CM

## 2020-12-20 LAB — POCT INR: INR: 2.1 (ref 2.0–3.0)

## 2020-12-20 NOTE — Patient Instructions (Signed)
Continue warfarin 1/2 tablet daily except 1 tablet on Wednesdays and Saturdays Recheck in 4 weeks Continue greens

## 2020-12-22 ENCOUNTER — Other Ambulatory Visit: Payer: Self-pay | Admitting: Family Medicine

## 2020-12-22 DIAGNOSIS — Z79899 Other long term (current) drug therapy: Secondary | ICD-10-CM | POA: Diagnosis not present

## 2020-12-23 LAB — BASIC METABOLIC PANEL
BUN/Creatinine Ratio: 16 (ref 12–28)
BUN: 23 mg/dL (ref 10–36)
CO2: 26 mmol/L (ref 20–29)
Calcium: 9.5 mg/dL (ref 8.7–10.3)
Chloride: 99 mmol/L (ref 96–106)
Creatinine, Ser: 1.42 mg/dL — ABNORMAL HIGH (ref 0.57–1.00)
Glucose: 94 mg/dL (ref 65–99)
Potassium: 4.2 mmol/L (ref 3.5–5.2)
Sodium: 141 mmol/L (ref 134–144)
eGFR: 34 mL/min/{1.73_m2} — ABNORMAL LOW (ref >59)

## 2020-12-24 ENCOUNTER — Telehealth: Payer: Self-pay | Admitting: Student

## 2020-12-24 DIAGNOSIS — Z79899 Other long term (current) drug therapy: Secondary | ICD-10-CM

## 2020-12-24 NOTE — Telephone Encounter (Signed)
Daughter/pt verbalized understanding.

## 2020-12-24 NOTE — Telephone Encounter (Signed)
    I am glad that she is feeling well with the medication changes. Continue current regimen for now with plans for repeat BMET in 2 weeks as previously recommended.   Signed, Erma Heritage, PA-C 12/24/2020, 2:21 PM Pager: [phone]

## 2020-12-24 NOTE — Telephone Encounter (Signed)
-----   Message from Erma Heritage, [place] sent at 12/23/2020 11:10 AM EDT ----- Please let the patient's daughter know her kidney function has slightly worsened since titration of Torsemide but this has not been as severe as in 07/2020. Creatinine was at 1.30 prior to dose adjustment of Torsemide and is now at 1.42 (previously at 1.6 - 1.9 in 2021). How is she feeling? If symptoms have improved with taking Torsemide 20 mg daily alternating with 40 mg daily would continue and recheck BMET again in 2 weeks. If symptoms were unchanged, can go back to '20mg'$  daily and just taking an extra '20mg'$  if needed.

## 2020-12-24 NOTE — Telephone Encounter (Signed)
Daughter calling about results also.  Daughter states that pt weight is stable at 160 lb using the alternating medication days. Daughter would like to continue using this method. Daughter also stated that pt has been feeling pretty good these last couple of days. Pt and daughter verbalized understanding of result note. Please advise.

## 2020-12-24 NOTE — Telephone Encounter (Signed)
[name] -daughter called in regards to patients taking her fluid pill. [phone]

## 2020-12-31 DIAGNOSIS — R0902 Hypoxemia: Secondary | ICD-10-CM | POA: Diagnosis not present

## 2021-01-14 ENCOUNTER — Other Ambulatory Visit: Payer: Self-pay | Admitting: Family Medicine

## 2021-01-17 ENCOUNTER — Ambulatory Visit (INDEPENDENT_AMBULATORY_CARE_PROVIDER_SITE_OTHER): Payer: Medicare Other | Admitting: *Deleted

## 2021-01-17 DIAGNOSIS — Z79899 Other long term (current) drug therapy: Secondary | ICD-10-CM | POA: Diagnosis not present

## 2021-01-17 DIAGNOSIS — Z5181 Encounter for therapeutic drug level monitoring: Secondary | ICD-10-CM | POA: Diagnosis not present

## 2021-01-17 DIAGNOSIS — I480 Paroxysmal atrial fibrillation: Secondary | ICD-10-CM | POA: Diagnosis not present

## 2021-01-17 LAB — POCT INR: INR: 3 (ref 2.0–3.0)

## 2021-01-17 NOTE — Patient Instructions (Signed)
Continue warfarin 1/2 tablet daily except 1 tablet on Wednesdays and Saturdays Recheck in 6 weeks Continue greens

## 2021-01-18 LAB — BASIC METABOLIC PANEL
BUN/Creatinine Ratio: 15 (ref 12–28)
BUN: 21 mg/dL (ref 10–36)
CO2: 26 mmol/L (ref 20–29)
Calcium: 9.6 mg/dL (ref 8.7–10.3)
Chloride: 98 mmol/L (ref 96–106)
Creatinine, Ser: 1.36 mg/dL — ABNORMAL HIGH (ref 0.57–1.00)
Glucose: 96 mg/dL (ref 65–99)
Potassium: 4 mmol/L (ref 3.5–5.2)
Sodium: 138 mmol/L (ref 134–144)
eGFR: 36 mL/min/{1.73_m2} — ABNORMAL LOW (ref >59)

## 2021-01-27 ENCOUNTER — Ambulatory Visit (INDEPENDENT_AMBULATORY_CARE_PROVIDER_SITE_OTHER): Payer: Medicare Other

## 2021-01-27 ENCOUNTER — Other Ambulatory Visit: Payer: Self-pay | Admitting: Family Medicine

## 2021-01-27 DIAGNOSIS — I495 Sick sinus syndrome: Secondary | ICD-10-CM | POA: Diagnosis not present

## 2021-01-27 LAB — CUP PACEART REMOTE DEVICE CHECK
Battery Remaining Longevity: 118 mo
Battery Remaining Percentage: 95.5 %
Battery Voltage: 3.01 V
Brady Statistic RV Percent Paced: 62 %
Date Time Interrogation Session: 20220602020014
Implantable Lead Implant Date: 20110422
Implantable Lead Implant Date: 20110422
Implantable Lead Location: 753859
Implantable Lead Location: 753860
Implantable Pulse Generator Implant Date: 20180319
Lead Channel Impedance Value: 400 Ohm
Lead Channel Pacing Threshold Amplitude: 1 V
Lead Channel Pacing Threshold Pulse Width: 0.5 ms
Lead Channel Sensing Intrinsic Amplitude: 5.2 mV
Lead Channel Setting Pacing Amplitude: 2 V
Lead Channel Setting Pacing Pulse Width: 0.5 ms
Lead Channel Setting Sensing Sensitivity: 1 mV
Pulse Gen Model: 2272
Pulse Gen Serial Number: 8010902

## 2021-01-31 DIAGNOSIS — R0902 Hypoxemia: Secondary | ICD-10-CM | POA: Diagnosis not present

## 2021-02-05 ENCOUNTER — Other Ambulatory Visit: Payer: Self-pay | Admitting: Cardiology

## 2021-02-14 ENCOUNTER — Other Ambulatory Visit: Payer: Self-pay | Admitting: Family Medicine

## 2021-02-21 NOTE — Progress Notes (Signed)
Remote pacemaker transmission.   

## 2021-02-24 ENCOUNTER — Other Ambulatory Visit: Payer: Self-pay

## 2021-02-24 ENCOUNTER — Ambulatory Visit (INDEPENDENT_AMBULATORY_CARE_PROVIDER_SITE_OTHER): Payer: Medicare Other | Admitting: *Deleted

## 2021-02-24 ENCOUNTER — Encounter: Payer: Self-pay | Admitting: Family Medicine

## 2021-02-24 ENCOUNTER — Ambulatory Visit (INDEPENDENT_AMBULATORY_CARE_PROVIDER_SITE_OTHER): Payer: Medicare Other | Admitting: Family Medicine

## 2021-02-24 VITALS — BP 115/71 | HR 88 | Temp 97.9°F | Resp 18 | Ht 65.0 in | Wt 160.4 lb

## 2021-02-24 DIAGNOSIS — Z5181 Encounter for therapeutic drug level monitoring: Secondary | ICD-10-CM | POA: Diagnosis not present

## 2021-02-24 DIAGNOSIS — E559 Vitamin D deficiency, unspecified: Secondary | ICD-10-CM | POA: Diagnosis not present

## 2021-02-24 DIAGNOSIS — R413 Other amnesia: Secondary | ICD-10-CM | POA: Diagnosis not present

## 2021-02-24 DIAGNOSIS — N3001 Acute cystitis with hematuria: Secondary | ICD-10-CM | POA: Diagnosis not present

## 2021-02-24 DIAGNOSIS — I480 Paroxysmal atrial fibrillation: Secondary | ICD-10-CM | POA: Diagnosis not present

## 2021-02-24 DIAGNOSIS — E7849 Other hyperlipidemia: Secondary | ICD-10-CM

## 2021-02-24 DIAGNOSIS — R252 Cramp and spasm: Secondary | ICD-10-CM

## 2021-02-24 DIAGNOSIS — L989 Disorder of the skin and subcutaneous tissue, unspecified: Secondary | ICD-10-CM

## 2021-02-24 DIAGNOSIS — E038 Other specified hypothyroidism: Secondary | ICD-10-CM

## 2021-02-24 DIAGNOSIS — F411 Generalized anxiety disorder: Secondary | ICD-10-CM

## 2021-02-24 DIAGNOSIS — N390 Urinary tract infection, site not specified: Secondary | ICD-10-CM | POA: Diagnosis not present

## 2021-02-24 DIAGNOSIS — I4891 Unspecified atrial fibrillation: Secondary | ICD-10-CM

## 2021-02-24 DIAGNOSIS — I10 Essential (primary) hypertension: Secondary | ICD-10-CM

## 2021-02-24 DIAGNOSIS — R42 Dizziness and giddiness: Secondary | ICD-10-CM | POA: Diagnosis not present

## 2021-02-24 DIAGNOSIS — Z9181 History of falling: Secondary | ICD-10-CM

## 2021-02-24 LAB — POCT INR: INR: 2.8 (ref 2.0–3.0)

## 2021-02-24 NOTE — Patient Instructions (Signed)
Continue warfarin 1/2 tablet daily except 1 tablet on Wednesdays and Saturdays Recheck in 6 weeks Continue greens

## 2021-02-24 NOTE — Patient Instructions (Addendum)
Annual exam in office with MD end August, call if you need me sooner  Please stop daytime meclizine for thenext 7 to 10 days, If you start having light headedness or dizziness resume it After 10 days , if you are doing well, stop the night time meclizine also, family member will need to stay with you  in the frst 7 to 14 nights when you stop the bedtime meclizine to make sure that you feel and are safe  Please get fasting lipid, cmp and EGFR, CBC, Magnesiun, TSH and vit D in the next 1 to 2 weeks  Urine being sent for testing today   Be careful not to fall  Thanks for choosing [facility], we consider it a privelige to serve you.

## 2021-02-27 ENCOUNTER — Encounter: Payer: Self-pay | Admitting: Family Medicine

## 2021-02-27 LAB — URINE CULTURE

## 2021-02-27 MED ORDER — BUSPIRONE HCL 5 MG PO TABS
5.0000 mg | ORAL_TABLET | Freq: Two times a day (BID) | ORAL | 5 refills | Status: DC
Start: 2021-02-27 — End: 2021-07-27

## 2021-02-27 NOTE — Assessment & Plan Note (Signed)
Controlled, no change in medication  

## 2021-02-27 NOTE — Assessment & Plan Note (Signed)
Stable , continue aricept  10 mg daily

## 2021-02-27 NOTE — Progress Notes (Signed)
   [name]     MRN: [id number]      DOB: 09-07-1925   HPI [name] is here for follow up and re-evaluation of chronic medical conditions, medication management and review of any available recent lab and radiology data.  Preventive health is updated, specifically  Cancer screening and Immunization.   Is followed regularly by Cardiology The PT denies any adverse reactions to current medications since the last visit.  C/o enlarging skin lesion on right forehead x years which itches at times, no bleeding   C/o leg/ankle swelling  ROS Denies recent fever or chills. Denies sinus pressure, nasal congestion, ear pain or sore throat. Denies chest congestion, productive cough or wheezing. Denies chest pains, palpitations and leg swelling Denies abdominal pain, nausea, vomiting,diarrhea or constipation.   Denies dysuria, frequency, hesitancy , probable odor with recurrent UTI, want to test for infection Denies   falls, uses walker, c/o chronic joint pain, swelling and limitation in mobility. Denies headaches, seizures, numbness, or tingling. Denies depression, anxiety or insomnia.  PE  BP 115/71 (BP Location: Right Arm, Patient Position: Sitting, Cuff Size: Large)   Pulse 88   Temp 97.9 F (36.6 C)   Resp 18   Ht '5\' 5"'$  (1.651 m)   Wt 160 lb 6.4 oz (72.8 kg)   SpO2 96%   BMI 26.69 kg/m   Patient alert and oriented and in no cardiopulmonary distress.  HEENT: No facial asymmetry, EOMI,     Neck decreased ROM .  Chest: Clear to auscultation bilaterally.  CVS: S1, S2 systolic  murmur, no S3.Regular rate.  ABD: Soft non tender.   Ext: No pitting edema  MS: Decreased  ROM spine, shoulders, hips and knees.  Skin: Intact, no ulcerations hyperpigmented macula rash noted.varied pigmentation, irregular border  Psych: Good eye contact, normal affect. Memory intact not anxious or depressed appearing.  CNS: CN 2-12 intact, power,  normal throughout.no focal deficits  noted.   Assessment & Plan  At high risk for falls Home safety reviewed and fall risk reduction discussed   Hypothyroidism Controlled, no change in medication Updated lab needed at/ before next visit.   Hyperlipidemia Hyperlipidemia:Low fat diet discussed and encouraged.   Lipid Panel  Lab Results  Component Value Date   CHOL 144 06/10/2020   HDL 59 06/10/2020   LDLCALC 65 06/10/2020   TRIG 111 06/10/2020   CHOLHDL 2.4 06/10/2020     Updated lab needed at/ before next visit.   Impaired memory Stable , continue aricept  10 mg daily  Skin lesion of face Enlarging hyperpigmented pruritic, right forehead approx 1 in max diameter, refer Derm  Recurrent UTI Mildly symptomatic, send for c/s  Paroxysmal atrial fibrillation (HCC) Maintained on coumadin  Vertigo Maintained on twice daily meclizine currently, try to d/c as pt denies vertigo, taper off over a 20 day period

## 2021-02-27 NOTE — Assessment & Plan Note (Signed)
Maintained on twice daily meclizine currently, try to d/c as pt denies vertigo, taper off over a 20 day period

## 2021-02-27 NOTE — Assessment & Plan Note (Signed)
Controlled, no change in medication Updated lab needed at/ before next visit.  

## 2021-02-27 NOTE — Assessment & Plan Note (Signed)
Mildly symptomatic, send for c/s

## 2021-02-27 NOTE — Assessment & Plan Note (Signed)
Hyperlipidemia:Low fat diet discussed and encouraged.   Lipid Panel  Lab Results  Component Value Date   CHOL 144 06/10/2020   HDL 59 06/10/2020   LDLCALC 65 06/10/2020   TRIG 111 06/10/2020   CHOLHDL 2.4 06/10/2020     Updated lab needed at/ before next visit.

## 2021-02-27 NOTE — Assessment & Plan Note (Signed)
Enlarging hyperpigmented pruritic, right forehead approx 1 in max diameter, refer Derm

## 2021-02-27 NOTE — Assessment & Plan Note (Signed)
Home safety reviewed and fall risk reduction discussed

## 2021-02-27 NOTE — Assessment & Plan Note (Signed)
Maintained on coumadin.  

## 2021-03-02 DIAGNOSIS — R0902 Hypoxemia: Secondary | ICD-10-CM | POA: Diagnosis not present

## 2021-03-08 NOTE — Progress Notes (Signed)
Cardiology Office Note    Date:  0000000   ID:  [name], DOB [id number], MRN [id number]  PCP:  Fayrene Helper, MD  Cardiologist: Carlyle Dolly, MD   EP: Dr. Lovena Le  Chief Complaint  Patient presents with   Follow-up    3 month visit    History of Present Illness:    [name] is a 87 y.o. female with past medical history of paroxysmal atrial fibrillation, symptomatic bradycardia (s/p PPM placement with gen-change in 10/2016), lower extremity edema, HTN, HLD and Stage 3 CKD who presents to the office today for 74-monthfollow-up.  She was last examined by myself in 11/2020 and reported that her weight had been fluctuating by 5 pounds on her home scales and was up to 166 lbs at times and she felt best at 160 - 161 lbs. Her Torsemide was increased from 20 mg daily to 20 mg daily alternating with 40 mg daily as she previously had an AKI when taking Torsemide '40mg'$  in AM/'20mg'$  in PM. Follow-up labs showed her creatinine had improved to 1.36 and K+ was stable at 4.0.  In talking with the patient and her daughters today (1 daughter joined by phone while the other was in person), she reports overall doing well since her last visit. She continues to have memory issues and family members help with ADL's. She uses a walker for ambulation around the home. No recent falls. Reports her breathing has overall been stable with no acute changes in this. No recent orthopnea, PND or pitting edema. She does use supplemental oxygen at night. No reported chest pain or palpitations.   They are very diligent about trying to limit her sodium intake. They limit her intake of [place] and she has not consumed potato chips in months.  Past Medical History:  Diagnosis Date   Annual physical exam 04/22/2015   Anxiety    Arthritis    Arthritis    Asthma    Atrial fibrillation (HCC)    Bleeding disorder (HCC)    Gastroesophageal reflux disease    Hearing impairment    Heart disease     Heart murmur    High cholesterol    Hyperlipidemia    Hypertension    Hypothyroidism    Impaired glucose tolerance    Macular degeneration    Pancreatitis    Paroxysmal atrial fibrillation ([facility] 2011   Sick sinus syndrome ([facility] 2011   Atrial fibrilation 10/2009; and dual chamber pacemaker in 4/11; normal EF   Sleep apnea    Nocturnal oxygen therapy   [place] 2016    Past Surgical History:  Procedure Laterality Date   ABDOMINAL HYSTERECTOMY     CATARACT EXTRACTION     Bilateral   COLONOSCOPY  2009   Dr. RGala Romney  EYE SURGERY     laser    PACEMAKER INSERTION  2011   dual chamber    PPM GENERATOR CHANGEOUT N/A 11/13/2016   Procedure: P[place]  Surgeon: TEvans Lance MD;  [facility];  Service: Cardiovascular;  Laterality: N/A;   SALPINGOOPHORECTOMY  1970   right    Current Medications: Outpatient Medications Prior to Visit  Medication Sig Dispense Refill   Ascorbic Acid (VITAMIN C PO) Take 1 tablet by mouth daily.     busPIRone (BUSPAR) 5 MG tablet Take 1 tablet (5 mg total) by mouth 2 (two) times daily. 60 tablet 5   Calcium Carb-Cholecalciferol 500-400 MG-UNIT CHEW Chew  1 tablet by mouth daily.  60 tablet    Cholecalciferol (VITAMIN D3 PO) Take 2 tablets by mouth daily.     Cyanocobalamin (VITAMIN B12 PO) Take 1 tablet by mouth daily.     donepezil (ARICEPT) 10 MG tablet Take 1 tablet (10 mg total) by mouth at bedtime. 90 tablet 1   fluticasone (FLONASE) 50 MCG/ACT nasal spray Place 1 spray into both nostrils daily.     levothyroxine (SYNTHROID) 112 MCG tablet TAKE 1 TABLET BY MOUTH ONCE DAILY. 90 tablet 0   loratadine (CLARITIN) 10 MG tablet Take 1 tablet (10 mg total) by mouth daily. 30 tablet 1   lovastatin (MEVACOR) 40 MG tablet TAKE ONE TABLET BY MOUTH AT BEDTIME. 90 tablet 1   meclizine (ANTIVERT) 12.5 MG tablet Take 1 tablet (12.5 mg total) by mouth 2 (two) times daily as needed for dizziness. 30 tablet 0   metoprolol tartrate  (LOPRESSOR) 25 MG tablet Take 1 tablet (25 mg total) by mouth 2 (two) times daily. Take this in addition to the '100mg'$  twice a day 180 tablet 3   Multiple Vitamins-Minerals (EYE VITAMINS & MINERALS PO) Take 1 tablet by mouth daily.     omeprazole (PRILOSEC) 40 MG capsule TAKE (1) CAPSULE BY MOUTH ONCE DAILY FOR ACID REFLUX. 30 capsule 0   OXYGEN-HELIUM IN Inhale 2 L into the lungs at bedtime.     torsemide (DEMADEX) 20 MG tablet Take 40 mg (2 tablets) every other day alternating with 20 mg (1 tablet). 270 tablet 3   warfarin (COUMADIN) 5 MG tablet TAKE 1/2 TABLET BY MOUTH DAILY EXCEPT 1 TABLET ON WEDNESDAYS  OR AS DIRECTED. 25 tablet 6   zinc gluconate 50 MG tablet Take 50 mg by mouth daily.     diltiazem (CARDIZEM CD) 240 MG 24 hr capsule TAKE ONE CAPSULE BY MOUTH ONCE DAILY. 30 capsule 11   metoprolol tartrate (LOPRESSOR) 100 MG tablet Take 1 tablet (100 mg total) by mouth 2 (two) times daily. 180 tablet 1   Facility-Administered Medications Prior to Visit  Medication Dose Route Frequency Provider Last Rate Last Admin   miconazole (MONISTAT 7) 2 % vaginal cream 1 Applicatorful  1 Applicatorful Vaginal QHS Jonnie Kind, MD         Allergies:   Patient has no known allergies.   Social History   Socioeconomic History   Marital status: Widowed    Spouse name: Not on file   Number of children: 7   Years of education: college   Highest education level: Not on file  Occupational History   Occupation: retired     Fish farm manager: RETIRED  Tobacco Use   Smoking status: Never   Smokeless tobacco: Never  Vaping Use   Vaping Use: Never used  Substance and Sexual Activity   Alcohol use: No    Alcohol/week: 0.0 standard drinks   Drug use: No   Sexual activity: Not Currently    Birth control/protection: Surgical    Comment: hyst  Other Topics Concern   Not on file  Social History Narrative   Not on file   Social Determinants of Health   Financial Resource Strain: Not on file  Food  Insecurity: Not on file  Transportation Needs: Not on file  Physical Activity: Not on file  Stress: Not on file  Social Connections: Not on file     Family History:  The patient's family history includes Arthritis in an other family member; Asthma in an other family member; Cancer  in her father and mother; Emphysema in her sister; Heart disease in an other family member; Lung disease in an other family member; Melanoma in her son; Mitral valve prolapse in her sister; Pneumonia in her sister.   Review of Systems:    Please see the history of present illness.     All other systems reviewed and are otherwise negative except as noted above.   Physical Exam:    VS:  BP 112/68   Pulse 94   Ht '5\' 6"'$  (1.676 m)   Wt 159 lb 12.8 oz (72.5 kg) Comment: Pt stated  SpO2 97%   BMI 25.79 kg/m    General: Pleasant elderly female appearing in no acute distress. Head: Normocephalic, atraumatic. Neck: No carotid bruits. JVD not elevated.  Lungs: Respirations regular and unlabored, without wheezes or rales.  Heart: Irregularly irregular. No S3 or S4.  No murmur, no rubs, or gallops appreciated. Abdomen: Appears non-distended. No obvious abdominal masses. Msk:  Strength and tone appear normal for age. No obvious joint deformities or effusions. Extremities: No clubbing or cyanosis. No pitting edema.  Distal pedal pulses are 2+ bilaterally. Neuro: Alert and oriented X 3. Moves all extremities spontaneously. No focal deficits noted. Psych:  Responds to questions appropriately with a normal affect. Skin: No rashes or lesions noted  Wt Readings from Last 3 Encounters:  03/09/21 159 lb 12.8 oz (72.5 kg)  02/24/21 160 lb 6.4 oz (72.8 kg)  12/08/20 163 lb 1.6 oz (74 kg)     Studies/Labs Reviewed:   EKG:  EKG is ordered today.  The ekg ordered today demonstrates atrial fibrillation, HR 94 with PVC's and occasional paced beats. Known RBBB and LAFB.   Recent Labs: 06/10/2020: ALT 9 06/24/2020:  Hemoglobin 10.9; NT-Pro BNP 2,779; Platelets 355 08/16/2020: Magnesium 2.3 10/27/2020: TSH 3.410 01/17/2021: BUN 21; Creatinine, Ser 1.36; Potassium 4.0; Sodium 138   Lipid Panel    Component Value Date/Time   CHOL 144 06/10/2020 1103   TRIG 111 06/10/2020 1103   HDL 59 06/10/2020 1103   CHOLHDL 2.4 06/10/2020 1103   CHOLHDL 2.1 11/04/2019 1027   VLDL 53 (H) 11/22/2016 1005   LDLCALC 65 06/10/2020 1103   LDLCALC 50 11/04/2019 1027    Additional studies/ records that were reviewed today include:   Echocardiogram: 06/2020 IMPRESSIONS     1. Left ventricular ejection fraction, by estimation, is 55 to 60%. The  left ventricle has normal function. The left ventricle has no regional  wall motion abnormalities. Left ventricular diastolic parameters are  indeterminate.   2. Right ventricular systolic function is low normal. The right  ventricular size is mildly enlarged. There is normal pulmonary artery  systolic pressure.   3. Left atrial size was mildly dilated.   4. Right atrial size was mild to moderately dilated.   5. The mitral valve is normal in structure. Mild mitral valve  regurgitation. No evidence of mitral stenosis.   6. Tricuspid valve regurgitation is mild to moderate.   7. The aortic valve is tricuspid. There is moderate calcification of the  aortic valve. There is moderate thickening of the aortic valve. Aortic  valve regurgitation is not visualized. No aortic stenosis is present.   Assessment:    1. Bilateral lower extremity edema   2. Paroxysmal atrial fibrillation (HCC)   3. Sick sinus syndrome (HCC)   4. Stage 3b chronic kidney disease ([place])      Plan:   In order of problems listed above:  1. Lower  Extremity Edema - She reports her breathing has overall been stable and weight has been stable on her home scales as well. Will continue her current diuretic regimen with Torsemide 40 mg daily alternating with 20 mg daily. Her family is aware to give an extra  tablet if needed for worsening edema or weight gain as she typically becomes symptomatic with her weight above 162 lbs.  They were encouraged to continue to limit sodium intake and have been doing a great job with this.   2. Paroxysmal Atrial Fibrillation - Her heart rate was elevated in the 110's upon arrival today but rechecked and improved into the 90's. She denies any recent palpitations. Continue Cardizem CD 240 mg daily and Lopressor 125 mg twice daily for rate control. - No reports of active bleeding. She remains on Coumadin for anticoagulation.  3. Symptomatic Bradycardia - She is s/p PPM placement with gen-change in 10/2016. Followed by Dr. Lovena Le.   4. Stage 3 CKD - Her creatinine was stable at 1.36 by recent labs in 12/2020. She is scheduled for repeat labs with her PCP within the next few weeks.    Medication Adjustments/Labs and Tests Ordered: Current medicines are reviewed at length with the patient today.  Concerns regarding medicines are outlined above.  Medication changes, Labs and Tests ordered today are listed in the Patient Instructions below. Patient Instructions  Medication Instructions:  Your physician recommends that you continue on your current medications as directed. Please refer to the Current Medication list given to you today.  *If you need a refill on your cardiac medications before your next appointment, please call your pharmacy*   Lab Work: NONE   If you have labs (blood work) drawn today and your tests are completely normal, you will receive your results only by: [place] (if you have MyChart) OR A paper copy in the mail If you have any lab test that is abnormal or we need to change your treatment, we will call you to review the results.   Testing/Procedures: NONE    Follow-Up: At [facility], you and your health needs are our priority.  As part of our continuing mission to provide you with exceptional heart care, we have created  designated Provider Care Teams.  These Care Teams include your primary Cardiologist (physician) and Advanced Practice Providers (APPs -  Physician Assistants and Nurse Practitioners) who all work together to provide you with the care you need, when you need it.  We recommend signing up for the patient portal called "MyChart".  Sign up information is provided on this After Visit Summary.  MyChart is used to connect with patients for Virtual Visits (Telemedicine).  Patients are able to view lab/test results, encounter notes, upcoming appointments, etc.  Non-urgent messages can be sent to your provider as well.   To learn more about what you can do with MyChart, go to [email or web address].    Your next appointment:   4 month(s)  The format for your next appointment:   In Person  Provider:   Carlyle Dolly, MD   Other Instructions Thank you for choosing [facility]!     Signed, Erma Heritage, PA-C  03/09/2021 1:56 PM    [facility] [address] [place], [place] [address] Phone: [phone] Fax: [phone]

## 2021-03-09 ENCOUNTER — Ambulatory Visit: Payer: Medicare Other | Admitting: Student

## 2021-03-09 ENCOUNTER — Other Ambulatory Visit: Payer: Self-pay

## 2021-03-09 ENCOUNTER — Encounter: Payer: Self-pay | Admitting: Student

## 2021-03-09 VITALS — BP 112/68 | HR 94 | Ht 66.0 in | Wt 159.8 lb

## 2021-03-09 DIAGNOSIS — I480 Paroxysmal atrial fibrillation: Secondary | ICD-10-CM

## 2021-03-09 DIAGNOSIS — N1832 Chronic kidney disease, stage 3b: Secondary | ICD-10-CM | POA: Diagnosis not present

## 2021-03-09 DIAGNOSIS — R600 Localized edema: Secondary | ICD-10-CM

## 2021-03-09 DIAGNOSIS — I495 Sick sinus syndrome: Secondary | ICD-10-CM | POA: Diagnosis not present

## 2021-03-09 MED ORDER — DILTIAZEM HCL ER COATED BEADS 240 MG PO CP24: 240.0000 mg | ORAL_CAPSULE | Freq: Every day | ORAL | 3 refills | Status: DC

## 2021-03-09 MED ORDER — METOPROLOL TARTRATE 100 MG PO TABS: 100.0000 mg | ORAL_TABLET | Freq: Two times a day (BID) | ORAL | 3 refills | Status: DC

## 2021-03-09 NOTE — Patient Instructions (Signed)
Medication Instructions:  Your physician recommends that you continue on your current medications as directed. Please refer to the Current Medication list given to you today.  *If you need a refill on your cardiac medications before your next appointment, please call your pharmacy*   Lab Work: NONE  If you have labs (blood work) drawn today and your tests are completely normal, you will receive your results only by: . MyChart Message (if you have MyChart) OR . A paper copy in the mail If you have any lab test that is abnormal or we need to change your treatment, we will call you to review the results.   Testing/Procedures: NONE    Follow-Up: At [facility], you and your health needs are our priority.  As part of our continuing mission to provide you with exceptional heart care, we have created designated Provider Care Teams.  These Care Teams include your primary Cardiologist (physician) and Advanced Practice Providers (APPs -  Physician Assistants and Nurse Practitioners) who all work together to provide you with the care you need, when you need it.  We recommend signing up for the patient portal called "MyChart".  Sign up information is provided on this After Visit Summary.  MyChart is used to connect with patients for Virtual Visits (Telemedicine).  Patients are able to view lab/test results, encounter notes, upcoming appointments, etc.  Non-urgent messages can be sent to your provider as well.   To learn more about what you can do with MyChart, go to [email or web address].    Your next appointment:   4 month(s)  The format for your next appointment:   In Person  Provider:   Jonathan Branch, MD   Other Instructions Thank you for choosing [facility]!    

## 2021-03-15 DIAGNOSIS — E559 Vitamin D deficiency, unspecified: Secondary | ICD-10-CM | POA: Diagnosis not present

## 2021-03-15 DIAGNOSIS — E038 Other specified hypothyroidism: Secondary | ICD-10-CM | POA: Diagnosis not present

## 2021-03-15 DIAGNOSIS — I4891 Unspecified atrial fibrillation: Secondary | ICD-10-CM | POA: Diagnosis not present

## 2021-03-15 DIAGNOSIS — E7849 Other hyperlipidemia: Secondary | ICD-10-CM | POA: Diagnosis not present

## 2021-03-15 DIAGNOSIS — R252 Cramp and spasm: Secondary | ICD-10-CM | POA: Diagnosis not present

## 2021-03-16 LAB — CBC
Hematocrit: 33.8 % — ABNORMAL LOW (ref 34.0–46.6)
Hemoglobin: 10.8 g/dL — ABNORMAL LOW (ref 11.1–15.9)
MCH: 28.3 pg (ref 26.6–33.0)
MCHC: 32 g/dL (ref 31.5–35.7)
MCV: 89 fL (ref 79–97)
Platelets: 338 10*3/uL (ref 150–450)
RBC: 3.81 x10E6/uL (ref 3.77–5.28)
RDW: 13.1 % (ref 11.7–15.4)
WBC: 10 10*3/uL (ref 3.4–10.8)

## 2021-03-16 LAB — CMP14+EGFR
ALT: 10 IU/L (ref 0–32)
AST: 18 IU/L (ref 0–40)
Albumin/Globulin Ratio: 1.3 (ref 1.2–2.2)
Albumin: 4.1 g/dL (ref 3.5–4.6)
Alkaline Phosphatase: 128 IU/L — ABNORMAL HIGH (ref 44–121)
BUN/Creatinine Ratio: 18 (ref 12–28)
BUN: 27 mg/dL (ref 10–36)
Bilirubin Total: 0.4 mg/dL (ref 0.0–1.2)
CO2: 26 mmol/L (ref 20–29)
Calcium: 9.4 mg/dL (ref 8.7–10.3)
Chloride: 97 mmol/L (ref 96–106)
Creatinine, Ser: 1.49 mg/dL — ABNORMAL HIGH (ref 0.57–1.00)
Globulin, Total: 3.2 g/dL (ref 1.5–4.5)
Glucose: 97 mg/dL (ref 65–99)
Potassium: 3.9 mmol/L (ref 3.5–5.2)
Sodium: 141 mmol/L (ref 134–144)
Total Protein: 7.3 g/dL (ref 6.0–8.5)
eGFR: 33 mL/min/{1.73_m2} — ABNORMAL LOW (ref >59)

## 2021-03-16 LAB — LIPID PANEL
Chol/HDL Ratio: 2.4 ratio (ref 0.0–4.4)
Cholesterol, Total: 143 mg/dL (ref 100–199)
HDL: 60 mg/dL (ref >39)
LDL Chol Calc (NIH): 60 mg/dL (ref 0–99)
Triglycerides: 134 mg/dL (ref 0–149)
VLDL Cholesterol Cal: 23 mg/dL (ref 5–40)

## 2021-03-16 LAB — TSH: TSH: 1.62 u[IU]/mL (ref 0.450–4.500)

## 2021-03-16 LAB — MAGNESIUM: Magnesium: 2.2 mg/dL (ref 1.6–2.3)

## 2021-03-16 LAB — VITAMIN D 25 HYDROXY (VIT D DEFICIENCY, FRACTURES): Vit D, 25-Hydroxy: 66.8 ng/mL (ref 30.0–100.0)

## 2021-03-21 ENCOUNTER — Other Ambulatory Visit: Payer: Self-pay | Admitting: Family Medicine

## 2021-03-22 ENCOUNTER — Other Ambulatory Visit: Payer: Self-pay | Admitting: Family Medicine

## 2021-03-22 ENCOUNTER — Other Ambulatory Visit: Payer: Self-pay | Admitting: Physician Assistant

## 2021-03-30 ENCOUNTER — Telehealth: Payer: Self-pay | Admitting: Cardiology

## 2021-03-30 NOTE — Telephone Encounter (Signed)
Pt's daughter verbalized understanding. Will increase Torsemide to 60 mg for 2 days and update our office on Friday.

## 2021-03-30 NOTE — Telephone Encounter (Signed)
Pt weighed 166lbs on Sunday - she had 2 pills on Sun, Mon and Tues and she's 163lbs   Please call daughter @ [phone]

## 2021-03-30 NOTE — Telephone Encounter (Signed)
Pt's daughter stated that pt's family took her to the beach for 2 days and since pt has been back home pt had gained fluid weight. Pt daughter stated pt ate BBQ and onion rings on the way to the beach and then again on the way back home. Pt's daughter stated that she gave pt two fluid pills (Torsemide 20 mg tablets) on Saturday, Sunday, Monday, and Tuesday. Pt's daughter stated that pt's weight has been: Sun- 162 lb Mon- 165 lb Tues- 163 lb Wed- 163 lb Pt's daughter stated that she gave pt an extra fluid pill today.

## 2021-03-30 NOTE — Telephone Encounter (Signed)
Give a total of '60mg'$  of torsemide today, and '60mg'$  tomorrow. Update [place] on Friday.   Zandra Abts MD

## 2021-04-01 NOTE — Telephone Encounter (Signed)
Can go back to prior dosing, if recurrent weight gain can take '40mg'$  daily for a few days then back to alternating '40mg'$  and '20mg'$  dose   Zandra Abts MD

## 2021-04-01 NOTE — Telephone Encounter (Signed)
Since increasing fluid pill  Monday she was 165  then on Wednesday she was 163 and on Thursday she was 161.8 and today it went back up to 162 , do you want her to continue taking the 3 fluid pills or resume her regular regimen?

## 2021-04-01 NOTE — Telephone Encounter (Signed)
Daughter will resume her previous dosing of torsemide 40 mg alternating with 20 mg unless Dr.Branch has additional instructions.

## 2021-04-02 DIAGNOSIS — R0902 Hypoxemia: Secondary | ICD-10-CM | POA: Diagnosis not present

## 2021-04-07 ENCOUNTER — Ambulatory Visit (INDEPENDENT_AMBULATORY_CARE_PROVIDER_SITE_OTHER): Payer: Medicare Other | Admitting: *Deleted

## 2021-04-07 DIAGNOSIS — I480 Paroxysmal atrial fibrillation: Secondary | ICD-10-CM

## 2021-04-07 DIAGNOSIS — Z5181 Encounter for therapeutic drug level monitoring: Secondary | ICD-10-CM

## 2021-04-07 LAB — POCT INR: INR: 3 (ref 2.0–3.0)

## 2021-04-07 NOTE — Patient Instructions (Signed)
Continue warfarin 1/2 tablet daily except 1 tablet on Wednesdays and Saturdays Recheck in 6 weeks Continue greens

## 2021-04-14 ENCOUNTER — Telehealth: Payer: Self-pay | Admitting: Cardiology

## 2021-04-14 NOTE — Telephone Encounter (Signed)
Patient missed her coumadin dosage on 04-13-2021.  Calling for instructions on what to do now.    [phone].

## 2021-04-14 NOTE — Telephone Encounter (Signed)
Spoke with pt's daughter and instructed for pt to take 1 tablet ('5mg'$ ) tonight and then resume warfarin 1/2 tablet daily except 1 tablet on Wednesdays and Saturdays. Verbalized understanding.

## 2021-04-15 ENCOUNTER — Other Ambulatory Visit: Payer: Self-pay

## 2021-04-15 ENCOUNTER — Encounter: Payer: Self-pay | Admitting: Family Medicine

## 2021-04-15 ENCOUNTER — Ambulatory Visit (INDEPENDENT_AMBULATORY_CARE_PROVIDER_SITE_OTHER): Payer: Medicare Other | Admitting: Family Medicine

## 2021-04-15 VITALS — BP 122/77 | HR 96 | Temp 98.9°F | Resp 20 | Ht 66.0 in | Wt 162.0 lb

## 2021-04-15 DIAGNOSIS — Z0000 Encounter for general adult medical examination without abnormal findings: Secondary | ICD-10-CM

## 2021-04-15 DIAGNOSIS — I10 Essential (primary) hypertension: Secondary | ICD-10-CM

## 2021-04-15 DIAGNOSIS — E7849 Other hyperlipidemia: Secondary | ICD-10-CM | POA: Diagnosis not present

## 2021-04-15 DIAGNOSIS — E038 Other specified hypothyroidism: Secondary | ICD-10-CM

## 2021-04-15 MED ORDER — OMEPRAZOLE 40 MG PO CPDR: 40.0000 mg | DELAYED_RELEASE_CAPSULE | Freq: Every day | ORAL | 1 refills | Status: DC

## 2021-04-15 NOTE — Patient Instructions (Addendum)
F/U in early January, call if you need me sooner  Please call and come in for flu vaccine in September  Please get shingrix vaccines at the pharmacy  Fasting lipid, cmp and EGFr and tSH end December, 1 week before next appointent  Careful not to fall  Keep well!  Thanks for choosing [facility], we consider it a privelige to serve you.

## 2021-04-17 ENCOUNTER — Encounter: Payer: Self-pay | Admitting: Family Medicine

## 2021-04-17 NOTE — Assessment & Plan Note (Signed)
Annual exam as documented. Immunization needs are specifically addressed at this visit. Home safety and fall risk reduction discussed

## 2021-04-17 NOTE — Progress Notes (Signed)
    [name]     MRN: [id number]      DOB: 08-25-1926  HPI: Patient is in for annual physical exam. C/o marked fluctuation in HR despite pacemaker, not new, being followed closely by Cardiology Recent labs,  are reviewed and are good  Immunization is reviewed , and  needs to be updated .   PE: BP 122/77 (BP Location: Right Arm, Patient Position: Sitting, Cuff Size: Large)   Pulse (!) 110   Temp 98.9 F (37.2 C)   Resp 20   Ht '5\' 6"'$  (1.676 m)   Wt 162 lb (73.5 kg)   SpO2 96%   BMI 26.15 kg/m  Pulse re checked and was down to 96/ min Pleasant  female, alert and oriented x 3, in no cardio-pulmonary distress. Afebrile. HEENT No facial trauma or asymetry. Sinuses non tender.  Extra occullar muscles intact.. External ears normal, . Neck: decreased ROM,  no adenopathy,JVD or thyromegaly.No bruits.  Chest: Clear to ascultation bilaterally.No crackles or wheezes. Non tender to palpation    Cardiovascular system; Heart sounds normal,  S1 and  S2 ,no S3.    Peripheral pulses normal.  Abdomen: Soft, non tender, No guarding, tenderness or rebound.      Musculoskeletal exam: Decreased ROM of spine, hips , shoulders and knees. deformity ,swelling or crepitus noted. No muscle wasting or atrophy.   Neurologic: Cranial nerves significant loss of vision and hearing Power, tone ,sensation  normal throughout.  disturbance in gait. No tremor.Ambulates with a walker  Skin: Intact, no ulceration, erythema , scaling or rash noted. Pigmentation normal throughout  Psych; Normal mood and affect. Judgement and concentration normal   Assessment & Plan:  Annual physical exam Annual exam as documented. Immunization needs are specifically addressed at this visit. Home safety and fall risk reduction discussed

## 2021-04-27 ENCOUNTER — Telehealth: Payer: Self-pay | Admitting: Cardiology

## 2021-04-27 NOTE — Telephone Encounter (Signed)
Patient weighed 160 on Monday, 163.8 yesterday, and 163.2 today.  Daughter wants to know if she should take an additional dosage today or wait to see what her weight is tomorrow.

## 2021-04-27 NOTE — Telephone Encounter (Signed)
April 01, 2021 Hayley Lenis, MD to Me     4:42 PM Note Can go back to prior dosing, if recurrent weight gain can take '40mg'$  daily for a few days then back to alternating '40mg'$  and '20mg'$  dose    Hayley Abts MD       I spoke with daughter and she will increase torsemide to 40 mg daily for today and tomorrow as patient already took her 40 mg dose yesterday. She will update [place] on weight loss.

## 2021-04-28 ENCOUNTER — Ambulatory Visit (INDEPENDENT_AMBULATORY_CARE_PROVIDER_SITE_OTHER): Payer: Medicare Other

## 2021-04-28 DIAGNOSIS — I495 Sick sinus syndrome: Secondary | ICD-10-CM

## 2021-05-03 DIAGNOSIS — R0902 Hypoxemia: Secondary | ICD-10-CM | POA: Diagnosis not present

## 2021-05-03 LAB — CUP PACEART REMOTE DEVICE CHECK
Battery Remaining Longevity: 73 mo
Battery Remaining Percentage: 65 %
Battery Voltage: 3.01 V
Brady Statistic RV Percent Paced: 60 %
Date Time Interrogation Session: 20220901020011
Implantable Lead Implant Date: 20110422
Implantable Lead Implant Date: 20110422
Implantable Lead Location: 753859
Implantable Lead Location: 753860
Implantable Pulse Generator Implant Date: 20180319
Lead Channel Impedance Value: 380 Ohm
Lead Channel Pacing Threshold Amplitude: 1 V
Lead Channel Pacing Threshold Pulse Width: 0.5 ms
Lead Channel Sensing Intrinsic Amplitude: 5.1 mV
Lead Channel Setting Pacing Amplitude: 2 V
Lead Channel Setting Pacing Pulse Width: 0.5 ms
Lead Channel Setting Sensing Sensitivity: 1 mV
Pulse Gen Model: 2272
Pulse Gen Serial Number: 8010902

## 2021-05-04 ENCOUNTER — Encounter: Payer: Self-pay | Admitting: Family Medicine

## 2021-05-10 NOTE — Progress Notes (Signed)
Remote pacemaker transmission.   

## 2021-05-19 ENCOUNTER — Ambulatory Visit (INDEPENDENT_AMBULATORY_CARE_PROVIDER_SITE_OTHER): Payer: Medicare Other

## 2021-05-19 ENCOUNTER — Ambulatory Visit (INDEPENDENT_AMBULATORY_CARE_PROVIDER_SITE_OTHER): Payer: Medicare Other | Admitting: *Deleted

## 2021-05-19 ENCOUNTER — Other Ambulatory Visit: Payer: Self-pay

## 2021-05-19 DIAGNOSIS — Z23 Encounter for immunization: Secondary | ICD-10-CM

## 2021-05-19 DIAGNOSIS — Z5181 Encounter for therapeutic drug level monitoring: Secondary | ICD-10-CM

## 2021-05-19 DIAGNOSIS — I480 Paroxysmal atrial fibrillation: Secondary | ICD-10-CM | POA: Diagnosis not present

## 2021-05-19 LAB — POCT INR: INR: 4.2 — AB (ref 2.0–3.0)

## 2021-05-19 NOTE — Patient Instructions (Signed)
Hold warfarin tonight and tomorrow night then resume 1/2 tablet daily except 1 tablet on Wednesdays and Saturdays Recheck in 3 weeks Continue greens

## 2021-05-20 ENCOUNTER — Other Ambulatory Visit: Payer: Self-pay | Admitting: Cardiology

## 2021-05-21 ENCOUNTER — Other Ambulatory Visit: Payer: Self-pay | Admitting: Family Medicine

## 2021-05-23 ENCOUNTER — Encounter: Payer: Self-pay | Admitting: Family Medicine

## 2021-05-23 ENCOUNTER — Other Ambulatory Visit: Payer: Self-pay | Admitting: *Deleted

## 2021-05-23 MED ORDER — DONEPEZIL HCL 10 MG PO TABS: 10.0000 mg | ORAL_TABLET | Freq: Every day | ORAL | 1 refills | Status: DC

## 2021-06-02 DIAGNOSIS — R0902 Hypoxemia: Secondary | ICD-10-CM | POA: Diagnosis not present

## 2021-06-09 ENCOUNTER — Other Ambulatory Visit: Payer: Self-pay

## 2021-06-09 ENCOUNTER — Ambulatory Visit (INDEPENDENT_AMBULATORY_CARE_PROVIDER_SITE_OTHER): Payer: Medicare Other | Admitting: *Deleted

## 2021-06-09 DIAGNOSIS — Z5181 Encounter for therapeutic drug level monitoring: Secondary | ICD-10-CM

## 2021-06-09 DIAGNOSIS — I480 Paroxysmal atrial fibrillation: Secondary | ICD-10-CM | POA: Diagnosis not present

## 2021-06-09 LAB — POCT INR: INR: 4 — AB (ref 2.0–3.0)

## 2021-06-09 NOTE — Patient Instructions (Signed)
Hold warfarin tonight then decrease dose to 1/2 tablet daily  Recheck in 3 weeks Continue greens

## 2021-06-20 ENCOUNTER — Telehealth: Payer: Self-pay | Admitting: Family Medicine

## 2021-06-20 NOTE — Telephone Encounter (Signed)
LM re; new appointment information due to [facility] rescheduled to be seen by [facility]

## 2021-06-30 ENCOUNTER — Other Ambulatory Visit: Payer: Self-pay | Admitting: Cardiology

## 2021-06-30 ENCOUNTER — Ambulatory Visit (INDEPENDENT_AMBULATORY_CARE_PROVIDER_SITE_OTHER): Payer: Medicare Other | Admitting: *Deleted

## 2021-06-30 DIAGNOSIS — I480 Paroxysmal atrial fibrillation: Secondary | ICD-10-CM | POA: Diagnosis not present

## 2021-06-30 DIAGNOSIS — Z5181 Encounter for therapeutic drug level monitoring: Secondary | ICD-10-CM

## 2021-06-30 LAB — POCT INR: INR: 1.8 — AB (ref 2.0–3.0)

## 2021-06-30 NOTE — Patient Instructions (Signed)
Increase warfarin to 1/2 tablet daily except 1 tablet on Thursdays Recheck in 3 weeks Continue greens

## 2021-07-02 ENCOUNTER — Other Ambulatory Visit: Payer: Self-pay | Admitting: Family Medicine

## 2021-07-03 DIAGNOSIS — R0902 Hypoxemia: Secondary | ICD-10-CM | POA: Diagnosis not present

## 2021-07-04 MED ORDER — LEVOTHYROXINE SODIUM 112 MCG PO TABS: 112.0000 ug | ORAL_TABLET | Freq: Every day | ORAL | 3 refills | Status: DC

## 2021-07-06 ENCOUNTER — Encounter: Payer: Self-pay | Admitting: Cardiology

## 2021-07-06 ENCOUNTER — Ambulatory Visit: Payer: Medicare Other | Admitting: Cardiology

## 2021-07-06 ENCOUNTER — Other Ambulatory Visit: Payer: Self-pay

## 2021-07-06 VITALS — BP 116/68 | HR 109 | Ht 66.0 in | Wt 161.2 lb

## 2021-07-06 DIAGNOSIS — R600 Localized edema: Secondary | ICD-10-CM

## 2021-07-06 DIAGNOSIS — I4891 Unspecified atrial fibrillation: Secondary | ICD-10-CM

## 2021-07-06 DIAGNOSIS — I10 Essential (primary) hypertension: Secondary | ICD-10-CM | POA: Diagnosis not present

## 2021-07-06 DIAGNOSIS — E782 Mixed hyperlipidemia: Secondary | ICD-10-CM

## 2021-07-06 MED ORDER — DILTIAZEM HCL ER COATED BEADS 300 MG PO CP24: 300.0000 mg | ORAL_CAPSULE | Freq: Every day | ORAL | 3 refills | Status: DC

## 2021-07-06 NOTE — Patient Instructions (Signed)
Medication Instructions:  Your physician has recommended you make the following change in your medication:  INCREASE Diltiazem to 300 mg tablets once daily  *If you need a refill on your cardiac medications before your next appointment, please call your pharmacy*   Lab Work: None If you have labs (blood work) drawn today and your tests are completely normal, you will receive your results only by: [place] (if you have MyChart) OR A paper copy in the mail If you have any lab test that is abnormal or we need to change your treatment, we will call you to review the results.   Testing/Procedures: None   Follow-Up: At [facility], you and your health needs are our priority.  As part of our continuing mission to provide you with exceptional heart care, we have created designated Provider Care Teams.  These Care Teams include your primary Cardiologist (physician) and Advanced Practice Providers (APPs -  Physician Assistants and Nurse Practitioners) who all work together to provide you with the care you need, when you need it.  We recommend signing up for the patient portal called "MyChart".  Sign up information is provided on this After Visit Summary.  MyChart is used to connect with patients for Virtual Visits (Telemedicine).  Patients are able to view lab/test results, encounter notes, upcoming appointments, etc.  Non-urgent messages can be sent to your provider as well.   To learn more about what you can do with MyChart, go to [email or web address].    Your next appointment:   6 month(s)  The format for your next appointment:   In Person  Provider:   Carlyle Dolly, MD    Other Instructions

## 2021-07-06 NOTE — Progress Notes (Signed)
Clinical Summary [name] is a 87 y.o.femaleseen today for follow up of the following medical problems     1. Paroxysmal afib - Has not been interested in NOACs. Has been on coumadin  - no recnet palpitations.  Home HRs variable, can be 130s at times         2. Symptomatic bradycardia with pacemaker - followed by EP - recent normal device check   3. HTN - compliant with meds - she is compliant with meds       4. Hyperlipidemia  - she is on lovstatin  02/2021 TC 143 TG 134 HDL 60 LDL 60    5. Edema - 06/24/20 labs Cr 1.2 BUN 26 BNP 2779  - 06/2020 echo LVEF 55-60%, indet DDx  - swelling is up and down, though primarily controled - taking toresmide 40mg  alteranting days with 20mg  - home weights 159-161 lbs which is stable over time   SH: daughter of [name] who is also a patient of mine. She has a great grandsone here with her today started kindergarten, a great grandaughter who is 36 yos   Past Medical History:  Diagnosis Date   Annual physical exam 04/22/2015   Anxiety    Arthritis    Arthritis    Asthma    Atrial fibrillation (HCC)    Bleeding disorder ([place])    Gastroesophageal reflux disease    Hearing impairment    Heart disease    Heart murmur    High cholesterol    Hyperlipidemia    Hypertension    Hypothyroidism    Impaired glucose tolerance    Macular degeneration    Pancreatitis    Paroxysmal atrial fibrillation ([place]) 2011   Sick sinus syndrome ([place]) 2011   Atrial fibrilation 10/2009; and dual chamber pacemaker in 4/11; normal EF   Sleep apnea    Nocturnal oxygen therapy   Zoster 2016     No Known Allergies   Current Outpatient Medications  Medication Sig Dispense Refill   Ascorbic Acid (VITAMIN C PO) Take 1 tablet by mouth daily.     busPIRone (BUSPAR) 5 MG tablet Take 1 tablet (5 mg total) by mouth 2 (two) times daily. 60 tablet 5   Calcium Carb-Cholecalciferol 500-400 MG-UNIT CHEW Chew 1 tablet by mouth daily.  60  tablet    Cholecalciferol (VITAMIN D3 PO) Take 2 tablets by mouth daily.     Cyanocobalamin (VITAMIN B12 PO) Take 1 tablet by mouth daily.     diltiazem (CARDIZEM CD) 240 MG 24 hr capsule Take 1 capsule (240 mg total) by mouth daily. 90 capsule 3   donepezil (ARICEPT) 10 MG tablet Take 1 tablet (10 mg total) by mouth at bedtime. 90 tablet 1   fluticasone (FLONASE) 50 MCG/ACT nasal spray Place 1 spray into both nostrils daily.     levothyroxine (SYNTHROID) 112 MCG tablet Take 1 tablet (112 mcg total) by mouth daily. 90 tablet 3   loratadine (CLARITIN) 10 MG tablet Take 1 tablet (10 mg total) by mouth daily. 30 tablet 1   lovastatin (MEVACOR) 40 MG tablet TAKE ONE TABLET BY MOUTH AT BEDTIME. 90 tablet 1   meclizine (ANTIVERT) 12.5 MG tablet Take 1 tablet (12.5 mg total) by mouth 2 (two) times daily as needed for dizziness. 30 tablet 0   metoprolol tartrate (LOPRESSOR) 100 MG tablet Take 1 tablet (100 mg total) by mouth 2 (two) times daily. 180 tablet 3   metoprolol tartrate (LOPRESSOR) 25 MG  tablet TAKE (1) TABLET BY MOUTH TWICE DAILY. (TAKE WITH 100MG ) 180 tablet 0   Multiple Vitamins-Minerals (EYE VITAMINS & MINERALS PO) Take 1 tablet by mouth daily.     omeprazole (PRILOSEC) 40 MG capsule Take 1 capsule (40 mg total) by mouth daily. 90 capsule 1   OXYGEN-HELIUM IN Inhale 2 L into the lungs at bedtime.     torsemide (DEMADEX) 20 MG tablet Take 40 mg (2 tablets) every other day alternating with 20 mg (1 tablet). 270 tablet 3   warfarin (COUMADIN) 5 MG tablet TAKE 1/2 TABLET BY MOUTH DAILY OR AS DIRECTED. 25 tablet 5   zinc gluconate 50 MG tablet Take 50 mg by mouth daily.     Current Facility-Administered Medications  Medication Dose Route Frequency Provider Last Rate Last Admin   miconazole (MONISTAT 7) 2 % vaginal cream 1 Applicatorful  1 Applicatorful Vaginal QHS Jonnie Kind, MD         Past Surgical History:  Procedure Laterality Date   ABDOMINAL HYSTERECTOMY     CATARACT  EXTRACTION     Bilateral   COLONOSCOPY  2009   Dr. Gala Romney   EYE SURGERY     laser    PACEMAKER INSERTION  2011   dual chamber    PPM GENERATOR CHANGEOUT N/A 11/13/2016   Procedure: PPM Generator Changeout;  Surgeon: Evans Lance, MD;  Location: [facility];  Service: Cardiovascular;  Laterality: N/A;   SALPINGOOPHORECTOMY  1970   right     No Known Allergies    Family History  Problem Relation Age of Onset   Cancer Mother        gential   Cancer Father        throat    Pneumonia Sister    Emphysema Sister    Mitral valve prolapse Sister    Heart disease Other    Arthritis Other    Lung disease Other    Asthma Other    Melanoma Son      Social History [name] reports that she has never smoked. She has never used smokeless tobacco. [name] reports no history of alcohol use.   Review of Systems CONSTITUTIONAL: No weight loss, fever, chills, weakness or fatigue.  HEENT: Eyes: No visual loss, blurred vision, double vision or yellow sclerae.No hearing loss, sneezing, congestion, runny nose or sore throat.  SKIN: No rash or itching.  CARDIOVASCULAR: per hpi RESPIRATORY: No shortness of breath, cough or sputum.  GASTROINTESTINAL: No anorexia, nausea, vomiting or diarrhea. No abdominal pain or blood.  GENITOURINARY: No burning on urination, no polyuria NEUROLOGICAL: No headache, dizziness, syncope, paralysis, ataxia, numbness or tingling in the extremities. No change in bowel or bladder control.  MUSCULOSKELETAL: No muscle, back pain, joint pain or stiffness.  LYMPHATICS: No enlarged nodes. No history of splenectomy.  PSYCHIATRIC: No history of depression or anxiety.  ENDOCRINOLOGIC: No reports of sweating, cold or heat intolerance. No polyuria or polydipsia.  Marland Kitchen   Physical Examination Today's Vitals   07/06/21 1113  BP: 116/68  Pulse: (!) 109  SpO2: 96%  Weight: 161 lb 3.2 oz (73.1 kg)  Height: 5\' 6"  (1.676 m)   Body mass index is 26.02  kg/m.  Gen: resting comfortably, no acute distress HEENT: no scleral icterus, pupils equal round and reactive, no palptable cervical adenopathy,  CV: irreg, tachy Resp: Clear to auscultation bilaterally GI: abdomen is soft, non-tender, non-distended, normal bowel sounds, no hepatosplenomegaly MSK: extremities are warm, no edema.  Skin: warm, no rash Neuro:  no focal deficits Psych: appropriate affect   Diagnostic Studies  07/04/19 echo IMPRESSIONS      1. Left ventricular ejection fraction, by visual estimation, is 65 to 70%. The left ventricle has normal function. There is mildly increased left ventricular hypertrophy.  2. Left ventricular diastolic parameters are indeterminate in the setting of atrial fibrillation.  3. Global right ventricle has normal systolic function.The right ventricular size is mildly enlarged. No increase in right ventricular wall thickness.  4. Left atrial size was upper normal.  5. Right atrial size was upper normal.  6. Mild mitral annular calcification.  7. Moderate aortic valve annular calcification.  8. The mitral valve is grossly normal. Mild mitral valve regurgitation.  9. The tricuspid valve is grossly normal. Tricuspid valve regurgitation is trivial. 10. The aortic valve is tricuspid. Aortic valve regurgitation is not visualized. Mild aortic valve sclerosis without stenosis. 11. The pulmonic valve was grossly normal. Pulmonic valve regurgitation is trivial. 12. A pacer wire is visualized in the RV and RA. 13. The inferior vena cava is normal in size with greater than 50% respiratory variability, suggesting right atrial pressure of 3 mmHg. 14. Mildly elevated pulmonary artery systolic pressure. 15. The tricuspid regurgitant velocity is 2.71 m/s, and with an assumed right atrial pressure of 3 mmHg, the estimated right ventricular systolic pressure is mildly elevated at 32.4 mmHg.       Assessment and Plan  1. Afib -high HRs at times, increase  dilt to 300mg daily - not intersted in NOACs, continue coumadin   2. LE edema -doing well on torsemide alternating days with 40mg  and 20mg . Labs with pcp were stable, mild chronic renal dysfunction - continue torsmide at current dose  3. Hyperlipidemia - at goal, continue lovastatin  4 HTN - at goal, continue current meds      Arnoldo Lenis, M.D.

## 2021-07-18 ENCOUNTER — Ambulatory Visit: Payer: Medicare Other | Admitting: Cardiology

## 2021-07-19 ENCOUNTER — Ambulatory Visit (INDEPENDENT_AMBULATORY_CARE_PROVIDER_SITE_OTHER): Payer: Medicare Other | Admitting: Internal Medicine

## 2021-07-19 ENCOUNTER — Encounter: Payer: Self-pay | Admitting: Internal Medicine

## 2021-07-19 ENCOUNTER — Other Ambulatory Visit: Payer: Self-pay

## 2021-07-19 VITALS — BP 138/70 | HR 110 | Ht 66.0 in | Wt 160.2 lb

## 2021-07-19 DIAGNOSIS — I4891 Unspecified atrial fibrillation: Secondary | ICD-10-CM

## 2021-07-19 DIAGNOSIS — I495 Sick sinus syndrome: Secondary | ICD-10-CM | POA: Diagnosis not present

## 2021-07-19 DIAGNOSIS — I10 Essential (primary) hypertension: Secondary | ICD-10-CM

## 2021-07-19 LAB — CUP PACEART INCLINIC DEVICE CHECK
Battery Remaining Longevity: 72 mo
Battery Voltage: 3.01 V
Brady Statistic RA Percent Paced: 0 %
Brady Statistic RV Percent Paced: 60 %
Date Time Interrogation Session: 20221122123828
Implantable Lead Implant Date: 20110422
Implantable Lead Implant Date: 20110422
Implantable Lead Location: 753859
Implantable Lead Location: 753860
Implantable Pulse Generator Implant Date: 20180319
Lead Channel Impedance Value: 400 Ohm
Lead Channel Pacing Threshold Amplitude: 1 V
Lead Channel Pacing Threshold Amplitude: 1 V
Lead Channel Pacing Threshold Pulse Width: 0.5 ms
Lead Channel Pacing Threshold Pulse Width: 0.5 ms
Lead Channel Sensing Intrinsic Amplitude: 4.5 mV
Lead Channel Setting Pacing Amplitude: 2 V
Lead Channel Setting Pacing Pulse Width: 0.5 ms
Lead Channel Setting Sensing Sensitivity: 1 mV
Pulse Gen Model: 2272
Pulse Gen Serial Number: 8010902

## 2021-07-19 NOTE — Progress Notes (Signed)
HPI [name] returns today for followup. She is a pleasant 87 yo woman with chronic atrial fib, diastolic heart failure, s/p brady, s/p PPM insertion. She has not fallen. She denies syncope or loss of consciousness. She denies chest pain or sob. She is pending vascular eval. She has some venous insufficiency.  No Known Allergies   Current Outpatient Medications  Medication Sig Dispense Refill   Ascorbic Acid (VITAMIN C PO) Take 1 tablet by mouth daily.     busPIRone (BUSPAR) 5 MG tablet Take 1 tablet (5 mg total) by mouth 2 (two) times daily. 60 tablet 5   Calcium Carb-Cholecalciferol 500-400 MG-UNIT CHEW Chew 1 tablet by mouth daily.  60 tablet    Cholecalciferol (VITAMIN D3 PO) Take 2 tablets by mouth daily.     Cyanocobalamin (VITAMIN B12 PO) Take 1 tablet by mouth daily.     diltiazem (CARDIZEM CD) 300 MG 24 hr capsule Take 1 capsule (300 mg total) by mouth daily. 90 capsule 3   donepezil (ARICEPT) 10 MG tablet Take 1 tablet (10 mg total) by mouth at bedtime. 90 tablet 1   fluticasone (FLONASE) 50 MCG/ACT nasal spray Place 1 spray into both nostrils daily.     levothyroxine (SYNTHROID) 112 MCG tablet Take 1 tablet (112 mcg total) by mouth daily. 90 tablet 3   loratadine (CLARITIN) 10 MG tablet Take 1 tablet (10 mg total) by mouth daily. 30 tablet 1   lovastatin (MEVACOR) 40 MG tablet TAKE ONE TABLET BY MOUTH AT BEDTIME. 90 tablet 1   meclizine (ANTIVERT) 12.5 MG tablet Take 1 tablet (12.5 mg total) by mouth 2 (two) times daily as needed for dizziness. 30 tablet 0   metoprolol tartrate (LOPRESSOR) 100 MG tablet Take 1 tablet (100 mg total) by mouth 2 (two) times daily. 180 tablet 3   metoprolol tartrate (LOPRESSOR) 25 MG tablet TAKE (1) TABLET BY MOUTH TWICE DAILY. (TAKE WITH 100MG ) 180 tablet 0   Multiple Vitamins-Minerals (EYE VITAMINS & MINERALS PO) Take 1 tablet by mouth daily.     omeprazole (PRILOSEC) 40 MG capsule Take 1 capsule (40 mg total) by mouth daily. 90 capsule 1    OXYGEN-HELIUM IN Inhale 2 L into the lungs at bedtime.     torsemide (DEMADEX) 20 MG tablet Take 40 mg (2 tablets) every other day alternating with 20 mg (1 tablet). 270 tablet 3   warfarin (COUMADIN) 5 MG tablet TAKE 1/2 TABLET BY MOUTH DAILY OR AS DIRECTED. 25 tablet 5   zinc gluconate 50 MG tablet Take 50 mg by mouth daily.     Current Facility-Administered Medications  Medication Dose Route Frequency Provider Last Rate Last Admin   miconazole (MONISTAT 7) 2 % vaginal cream 1 Applicatorful  1 Applicatorful Vaginal QHS Jonnie Kind, MD         Past Medical History:  Diagnosis Date   Annual physical exam 04/22/2015   Anxiety    Arthritis    Arthritis    Asthma    Atrial fibrillation (HCC)    Bleeding disorder (HCC)    Gastroesophageal reflux disease    Hearing impairment    Heart disease    Heart murmur    High cholesterol    Hyperlipidemia    Hypertension    Hypothyroidism    Impaired glucose tolerance    Macular degeneration    Pancreatitis    Paroxysmal atrial fibrillation ([place]) 2011   Sick sinus syndrome ([place]) 2011   Atrial fibrilation  10/2009; and dual chamber pacemaker in 4/11; normal EF   Sleep apnea    Nocturnal oxygen therapy   Zoster 2016    ROS:   All systems reviewed and negative except as noted in the HPI.   Past Surgical History:  Procedure Laterality Date   ABDOMINAL HYSTERECTOMY     CATARACT EXTRACTION     Bilateral   COLONOSCOPY  2009   Dr. Gala Romney   EYE SURGERY     laser    PACEMAKER INSERTION  2011   dual chamber    PPM GENERATOR CHANGEOUT N/A 11/13/2016   Procedure: PPM Generator Changeout;  Surgeon: Evans Lance, MD;  Location: [facility];  Service: Cardiovascular;  Laterality: N/A;   SALPINGOOPHORECTOMY  1970   right     Family History  Problem Relation Age of Onset   Cancer Mother        gential   Cancer Father        throat    Pneumonia Sister    Emphysema Sister    Mitral valve prolapse Sister    Heart  disease Other    Arthritis Other    Lung disease Other    Asthma Other    Melanoma Son      Social History   Socioeconomic History   Marital status: Widowed    Spouse name: Not on file   Number of children: 7   Years of education: college   Highest education level: Not on file  Occupational History   Occupation: retired     Fish farm manager: RETIRED  Tobacco Use   Smoking status: Never   Smokeless tobacco: Never  Vaping Use   Vaping Use: Never used  Substance and Sexual Activity   Alcohol use: No    Alcohol/week: 0.0 standard drinks   Drug use: No   Sexual activity: Not Currently    Birth control/protection: Surgical    Comment: hyst  Other Topics Concern   Not on file  Social History Narrative   Not on file   Social Determinants of Health   Financial Resource Strain: Not on file  Food Insecurity: Not on file  Transportation Needs: Not on file  Physical Activity: Not on file  Stress: Not on file  Social Connections: Not on file  Intimate Partner Violence: Not on file     There were no vitals taken for this visit.  Physical Exam:  Well appearing NAD HEENT: Unremarkable Neck:  No JVD, no thyromegally Lymphatics:  No adenopathy Back:  No CVA tenderness Lungs:  Clear HEART:  Regular rate rhythm, no murmurs, no rubs, no clicks Abd:  soft, positive bowel sounds, no organomegally, no rebound, no guarding Ext:  2 plus pulses, no edema, no cyanosis, no clubbing Skin:  No rashes no nodules Neuro:  CN II through XII intact, motor grossly intact  EKG  DEVICE  Normal device function.  See PaceArt for details.   Assess/Plan:  1. Persistent atrial fib - her VR is mostly controlled. She is pacing 60%. 2. PPM - her [employer] Jude PPM is working normally with 10 years of battery longevity. 3. HTN - her bp is much improved. 4. Diastolic heart failure - her diuretic was changed from lasix to torsemide. She takes an extra when her weight goes up.   Carleene Overlie John Vasconcelos,MD

## 2021-07-19 NOTE — Patient Instructions (Signed)
Medication Instructions:  Your physician recommends that you continue on your current medications as directed. Please refer to the Current Medication list given to you today.  *If you need a refill on your cardiac medications before your next appointment, please call your pharmacy*   Lab Work: NONE   If you have labs (blood work) drawn today and your tests are completely normal, you will receive your results only by: . MyChart Message (if you have MyChart) OR . A paper copy in the mail If you have any lab test that is abnormal or we need to change your treatment, we will call you to review the results.   Testing/Procedures: NONE    Follow-Up: At [facility], you and your health needs are our priority.  As part of our continuing mission to provide you with exceptional heart care, we have created designated Provider Care Teams.  These Care Teams include your primary Cardiologist (physician) and Advanced Practice Providers (APPs -  Physician Assistants and Nurse Practitioners) who all work together to provide you with the care you need, when you need it.  We recommend signing up for the patient portal called "MyChart".  Sign up information is provided on this After Visit Summary.  MyChart is used to connect with patients for Virtual Visits (Telemedicine).  Patients are able to view lab/test results, encounter notes, upcoming appointments, etc.  Non-urgent messages can be sent to your provider as well.   To learn more about what you can do with MyChart, go to [email or web address].    Your next appointment:   1 year(s)  The format for your next appointment:   In Person  Provider:   Gregg Taylor, MD   Other Instructions Thank you for choosing [facility]!    

## 2021-07-25 ENCOUNTER — Ambulatory Visit (INDEPENDENT_AMBULATORY_CARE_PROVIDER_SITE_OTHER): Payer: Medicare Other | Admitting: *Deleted

## 2021-07-25 DIAGNOSIS — I480 Paroxysmal atrial fibrillation: Secondary | ICD-10-CM | POA: Diagnosis not present

## 2021-07-25 DIAGNOSIS — Z5181 Encounter for therapeutic drug level monitoring: Secondary | ICD-10-CM | POA: Diagnosis not present

## 2021-07-25 LAB — POCT INR: INR: 2.6 (ref 2.0–3.0)

## 2021-07-25 NOTE — Patient Instructions (Signed)
Continue warfarin 1/2 tablet daily except 1 tablet on Thursdays Recheck in 4 weeks Continue greens

## 2021-07-27 ENCOUNTER — Other Ambulatory Visit: Payer: Self-pay | Admitting: Family Medicine

## 2021-07-27 ENCOUNTER — Other Ambulatory Visit: Payer: Self-pay

## 2021-07-27 ENCOUNTER — Ambulatory Visit (INDEPENDENT_AMBULATORY_CARE_PROVIDER_SITE_OTHER): Payer: Medicare Other

## 2021-07-27 VITALS — BP 112/64 | Temp 97.3°F | Wt 164.6 lb

## 2021-07-27 DIAGNOSIS — Z0000 Encounter for general adult medical examination without abnormal findings: Secondary | ICD-10-CM

## 2021-07-27 MED ORDER — OMEPRAZOLE 40 MG PO CPDR
DELAYED_RELEASE_CAPSULE | ORAL | 1 refills | Status: DC
Start: 2021-07-27 — End: 2022-02-01

## 2021-07-27 MED ORDER — BUSPIRONE HCL 5 MG PO TABS: 5.0000 mg | ORAL_TABLET | Freq: Two times a day (BID) | ORAL | 5 refills | Status: DC

## 2021-07-27 NOTE — Patient Instructions (Addendum)
[name] , Thank you for taking time to come for your Medicare Wellness Visit. I appreciate your ongoing commitment to your health goals. Please review the following plan we discussed and let me know if I can assist you in the future.   These are the goals we discussed:  Goals       DIET - EAT MORE FRUITS AND VEGETABLES      Increase physical activity      Patient would like to be able to increase her physical activity so she can be more active with her great grandchildren.      LIFESTYLE - DECREASE FALLS RISK (pt-stated)      Keep doing what you are doing and use cane/walker when needed. Great job at no falls!        This is a list of the screening recommended for you and due dates:  Health Maintenance  Topic Date Due   Tetanus Vaccine  04/21/2025   Pneumonia Vaccine  Completed   Flu Shot  Completed   DEXA scan (bone density measurement)  Completed   HPV Vaccine  Aged Out   COVID-19 Vaccine  Discontinued   Zoster (Shingles) Vaccine  Discontinued      Health Maintenance, Female Adopting a healthy lifestyle and getting preventive care are important in promoting health and wellness. Ask your health care provider about: The right schedule for you to have regular tests and exams. Things you can do on your own to prevent diseases and keep yourself healthy. What should I know about diet, weight, and exercise? Eat a healthy diet  Eat a diet that includes plenty of vegetables, fruits, low-fat dairy products, and lean protein. Do not eat a lot of foods that are high in solid fats, added sugars, or sodium. Maintain a healthy weight Body mass index (BMI) is used to identify weight problems. It estimates body fat based on height and weight. Your health care provider can help determine your BMI and help you achieve or maintain a healthy weight. Get regular exercise Get regular exercise. This is one of the most important things you can do for your health. Most adults should: Exercise  for at least 150 minutes each week. The exercise should increase your heart rate and make you sweat (moderate-intensity exercise). Do strengthening exercises at least twice a week. This is in addition to the moderate-intensity exercise. Spend less time sitting. Even light physical activity can be beneficial. Watch cholesterol and blood lipids Have your blood tested for lipids and cholesterol at 85 years of age, then have this test every 5 years. Have your cholesterol levels checked more often if: Your lipid or cholesterol levels are high. You are older than 85 years of age. You are at high risk for heart disease. What should I know about cancer screening? Depending on your health history and family history, you may need to have cancer screening at various ages. This may include screening for: Breast cancer. Cervical cancer. Colorectal cancer. Skin cancer. Lung cancer. What should I know about heart disease, diabetes, and high blood pressure? Blood pressure and heart disease High blood pressure causes heart disease and increases the risk of stroke. This is more likely to develop in people who have high blood pressure readings or are overweight. Have your blood pressure checked: Every 3-5 years if you are 43-76 years of age. Every year if you are 40 years old or older. Diabetes Have regular diabetes screenings. This checks your fasting blood sugar level. Have the  screening done: Once every three years after age 7 if you are at a normal weight and have a low risk for diabetes. More often and at a younger age if you are overweight or have a high risk for diabetes. What should I know about preventing infection? Hepatitis B If you have a higher risk for hepatitis B, you should be screened for this virus. Talk with your health care provider to find out if you are at risk for hepatitis B infection. Hepatitis C Testing is recommended for: Everyone born from [birth year] through 1963. Anyone with  known risk factors for hepatitis C. Sexually transmitted infections (STIs) Get screened for STIs, including gonorrhea and chlamydia, if: You are sexually active and are younger than 85 years of age. You are older than 85 years of age and your health care provider tells you that you are at risk for this type of infection. Your sexual activity has changed since you were last screened, and you are at increased risk for chlamydia or gonorrhea. Ask your health care provider if you are at risk. Ask your health care provider about whether you are at high risk for HIV. Your health care provider may recommend a prescription medicine to help prevent HIV infection. If you choose to take medicine to prevent HIV, you should first get tested for HIV. You should then be tested every 3 months for as long as you are taking the medicine. Pregnancy If you are about to stop having your period (premenopausal) and you may become pregnant, seek counseling before you get pregnant. Take 400 to 800 micrograms (mcg) of folic acid every day if you become pregnant. Ask for birth control (contraception) if you want to prevent pregnancy. Osteoporosis and menopause Osteoporosis is a disease in which the bones lose minerals and strength with aging. This can result in bone fractures. If you are 36 years old or older, or if you are at risk for osteoporosis and fractures, ask your health care provider if you should: Be screened for bone loss. Take a calcium or vitamin D supplement to lower your risk of fractures. Be given hormone replacement therapy (HRT) to treat symptoms of menopause. Follow these instructions at home: Alcohol use Do not drink alcohol if: Your health care provider tells you not to drink. You are pregnant, may be pregnant, or are planning to become pregnant. If you drink alcohol: Limit how much you have to: 0-1 drink a day. Know how much alcohol is in your drink. In the [place] one drink equals one 12 oz bottle  of beer (355 mL), one 5 oz glass of wine (148 mL), or one 1 oz glass of hard liquor (44 mL). Lifestyle Do not use any products that contain nicotine or tobacco. These products include cigarettes, chewing tobacco, and vaping devices, such as e-cigarettes. If you need help quitting, ask your health care provider. Do not use street drugs. Do not share needles. Ask your health care provider for help if you need support or information about quitting drugs. General instructions Schedule regular health, dental, and eye exams. Stay current with your vaccines. Tell your health care provider if: You often feel depressed. You have ever been abused or do not feel safe at home. Summary Adopting a healthy lifestyle and getting preventive care are important in promoting health and wellness. Follow your health care provider's instructions about healthy diet, exercising, and getting tested or screened for diseases. Follow your health care provider's instructions on monitoring your cholesterol and blood pressure.  This information is not intended to replace advice given to you by your health care provider. Make sure you discuss any questions you have with your health care provider. Document Revised: 01/03/2021 Document Reviewed: 01/03/2021 Elsevier Patient Education  [place].

## 2021-07-27 NOTE — Progress Notes (Signed)
I connected with  [name] on 00/86/[birth year] by a audio enabled telemedicine application and verified that I am speaking with the correct person using two identifiers.  Patient Location: Home  Provider Location: Office/Clinic  I discussed the limitations of evaluation and management by telemedicine. The patient expressed understanding and agreed to proceed.   Subjective:   [name] is a 87 y.o. female who presents for Medicare Annual (Subsequent) preventive examination.  Review of Systems     Cardiac Risk Factors include: hypertension;sedentary lifestyle;dyslipidemia;advanced age (>70men, >80 women)     Objective:    Today's Vitals   07/27/21 1202  BP: 112/64  Temp: (!) 97.3 F (36.3 C)  SpO2: 97%  Weight: 164 lb 9.6 oz (74.7 kg)   Body mass index is 26.57 kg/m.  Advanced Directives 07/27/2021 07/06/2020 07/08/2019 07/04/2019 07/03/2019 11/13/2016 10/18/2016  Does Patient Have a Medical Advance Directive? No No No No No No No  Type of Advance Directive - - - - - - -  Would patient like information on creating a medical advance directive? Yes (ED - Information included in AVS) Yes (ED - Information included in AVS) No - Patient declined No - Patient declined No - Patient declined No - Patient declined (No Data)    Current Medications (verified) Outpatient Encounter Medications as of 07/27/2021  Medication Sig   Ascorbic Acid (VITAMIN C PO) Take 1 tablet by mouth daily.   busPIRone (BUSPAR) 5 MG tablet Take 1 tablet (5 mg total) by mouth 2 (two) times daily.   Calcium Carb-Cholecalciferol 500-400 MG-UNIT CHEW Chew 1 tablet by mouth daily.    Cholecalciferol (VITAMIN D3 PO) Take 2 tablets by mouth daily.   Cyanocobalamin (VITAMIN B12 PO) Take 1 tablet by mouth daily.   diltiazem (CARDIZEM CD) 300 MG 24 hr capsule Take 1 capsule (300 mg total) by mouth daily.   donepezil (ARICEPT) 10 MG tablet Take 1 tablet (10 mg total) by mouth at bedtime.   fluticasone (FLONASE) 50  MCG/ACT nasal spray Place 1 spray into both nostrils daily.   levothyroxine (SYNTHROID) 112 MCG tablet Take 1 tablet (112 mcg total) by mouth daily.   loratadine (CLARITIN) 10 MG tablet Take 1 tablet (10 mg total) by mouth daily.   lovastatin (MEVACOR) 40 MG tablet TAKE ONE TABLET BY MOUTH AT BEDTIME.   meclizine (ANTIVERT) 12.5 MG tablet Take 1 tablet (12.5 mg total) by mouth 2 (two) times daily as needed for dizziness.   metoprolol tartrate (LOPRESSOR) 100 MG tablet Take 1 tablet (100 mg total) by mouth 2 (two) times daily.   metoprolol tartrate (LOPRESSOR) 25 MG tablet TAKE (1) TABLET BY MOUTH TWICE DAILY. (TAKE WITH 100MG )   Multiple Vitamins-Minerals (EYE VITAMINS & MINERALS PO) Take 1 tablet by mouth daily.   omeprazole (PRILOSEC) 40 MG capsule TAKE (1) CAPSULE BY MOUTH ONCE DAILY FOR ACID REFLUX.   OXYGEN-HELIUM IN Inhale 2 L into the lungs at bedtime.   torsemide (DEMADEX) 20 MG tablet Take 40 mg (2 tablets) every other day alternating with 20 mg (1 tablet).   warfarin (COUMADIN) 5 MG tablet TAKE 1/2 TABLET BY MOUTH DAILY OR AS DIRECTED.   zinc gluconate 50 MG tablet Take 50 mg by mouth daily.   [DISCONTINUED] benzonatate (TESSALON PERLES) 100 MG capsule Take 1 capsule (100 mg total) by mouth every 6 (six) hours as needed for cough.   [DISCONTINUED] busPIRone (BUSPAR) 5 MG tablet Take 1 tablet (5 mg total) by mouth 2 (two)  times daily.   [DISCONTINUED] omeprazole (PRILOSEC) 40 MG capsule TAKE (1) CAPSULE BY MOUTH ONCE DAILY FOR ACID REFLUX.   Facility-Administered Encounter Medications as of 07/27/2021  Medication   miconazole (MONISTAT 7) 2 % vaginal cream 1 Applicatorful    Allergies (verified) Patient has no known allergies.   History: Past Medical History:  Diagnosis Date   Allergy    Annual physical exam 04/22/2015   Anxiety    Arthritis    Arthritis    Asthma    Atrial fibrillation (HCC)    Bleeding disorder (HCC)    CHF (congestive heart failure) (HCC)    COPD  (chronic obstructive pulmonary disease) (HCC)    Depression    Gastroesophageal reflux disease    Hearing impairment    Heart disease    Heart murmur    High cholesterol    Hyperlipidemia    Hypertension    Hypothyroidism    Impaired glucose tolerance    Macular degeneration    Oxygen deficiency    Pancreatitis    Paroxysmal atrial fibrillation ([place]) 2011   Sick sinus syndrome ([place]) 2011   Atrial fibrilation 10/2009; and dual chamber pacemaker in 4/11; normal EF   Sleep apnea    Nocturnal oxygen therapy   Zoster 2016   Past Surgical History:  Procedure Laterality Date   ABDOMINAL HYSTERECTOMY     CATARACT EXTRACTION     Bilateral   COLONOSCOPY  08/29/2007   Dr. Gala Romney   EYE SURGERY     laser    PACEMAKER INSERTION  08/28/2009   dual chamber    PPM GENERATOR CHANGEOUT N/A 11/13/2016   Procedure: PPM Generator Changeout;  Surgeon: Evans Lance, MD;  Location: [facility];  Service: Cardiovascular;  Laterality: N/A;   SALPINGOOPHORECTOMY  08/28/1968   right   Family History  Problem Relation Age of Onset   Cancer Mother        gential   Cancer Father        throat    Pneumonia Sister    Emphysema Sister    Mitral valve prolapse Sister    Heart disease Other    Arthritis Other    Lung disease Other    Asthma Other    Melanoma Son    Anxiety disorder Daughter    Arthritis Daughter    Asthma Daughter    COPD Daughter    Depression Daughter    Kidney disease Daughter    Obesity Daughter    Anxiety disorder Daughter    Anxiety disorder Daughter    Arthritis Daughter    Social History   Socioeconomic History   Marital status: Widowed    Spouse name: Not on file   Number of children: 7   Years of education: college   Highest education level: Not on file  Occupational History   Occupation: retired     Fish farm manager: RETIRED  Tobacco Use   Smoking status: Never   Smokeless tobacco: Never  Vaping Use   Vaping Use: Never used  Substance and Sexual  Activity   Alcohol use: No   Drug use: No   Sexual activity: Not Currently    Birth control/protection: Surgical    Comment: hyst  Other Topics Concern   Not on file  Social History Narrative   Not on file   Social Determinants of Health   Financial Resource Strain: Not on file  Food Insecurity: Not on file  Transportation Needs: Not on file  Physical  Activity: Not on file  Stress: Not on file  Social Connections: Not on file    Tobacco Counseling Counseling given: Not Answered   Clinical Intake:              How often do you need to have someone help you when you read instructions, pamphlets, or other written materials from your doctor or pharmacy?: (P) 5 - Always  Diabetic? no         Activities of Daily Living In your present state of health, do you have any difficulty performing the following activities: 07/27/2021 07/26/2021  Hearing? Tempie Donning  Vision? N Y  Difficulty concentrating or making decisions? N N  Walking or climbing stairs? Y Y  Dressing or bathing? N N  Doing errands, shopping? Tempie Donning  Preparing Food and eating ? Y N  Using the Toilet? N N  In the past six months, have you accidently leaked urine? N Y  Do you have problems with loss of bowel control? N N  Managing your Medications? Y N  Managing your Finances? Y N  Housekeeping or managing your Housekeeping? Y N  Some recent data might be hidden    Patient Care Team: Fayrene Helper, MD as PCP - General (Family Medicine) Harl Bowie, Alphonse Guild, MD as PCP - Cardiology (Cardiology) Evans Lance, MD (Cardiology) Gala Romney Cristopher Estimable, MD as Attending Physician (Gastroenterology) Rothbart, Cristopher Estimable, MD (Inactive) as Attending Physician (Cardiology) Hayden Pedro, MD as Attending Physician (Ophthalmology) Sinda Du, MD as Attending Physician (Pulmonary Disease)  Indicate any recent Medical Services you may have received from other than [facility] providers in the past year (date may be  approximate).     Assessment:   This is a routine wellness examination for [name].  Hearing/Vision screen No results found.  Dietary issues and exercise activities discussed: Current Exercise Habits: The patient does not participate in regular exercise at present, Exercise limited by: orthopedic condition(s);Other - see comments (gait instability)   Goals Addressed               This Visit's Progress     Increase physical activity   On track     Patient would like to be able to increase her physical activity so she can be more active with her great grandchildren.      LIFESTYLE - DECREASE FALLS RISK (pt-stated)   On track     Keep doing what you are doing and use cane/walker when needed. Great job at no falls!      COMPLETED: Protect My Health        Pt states: "keep going"        Depression Screen PHQ 2/9 Scores 04/15/2021 02/24/2021 08/03/2020 07/06/2020 07/06/2020 06/17/2020 02/25/2020  PHQ - 2 Score 2 0 0 0 2 1 0  PHQ- 9 Score 3 - 0 2 4 - -    Fall Risk Fall Risk  07/27/2021 07/26/2021 04/15/2021 02/24/2021 10/21/2020  Falls in the past year? - 0 0 0 0  Number falls in past yr: 0 0 0 0 0  Injury with Fall? 0 0 0 0 0  Risk Factor Category  - - - - -  Risk for fall due to : - - No Fall Risks No Fall Risks -  Follow up - - Falls evaluation completed Falls evaluation completed -  Comment - - - - -    FALL RISK PREVENTION PERTAINING TO THE HOME:  Any stairs in or around the  home? No  If so, are there any without handrails? No  Home free of loose throw rugs in walkways, pet beds, electrical cords, etc? Yes  Adequate lighting in your home to reduce risk of falls? Yes   ASSISTIVE DEVICES UTILIZED TO PREVENT FALLS:  Life alert? No  Use of a cane, walker or w/c? Yes  Grab bars in the bathroom? No  Shower chair or bench in shower? Yes  Elevated toilet seat or a handicapped toilet? No     Cognitive Function: MMSE - Mini Mental State Exam 11/13/2019 09/24/2019   Orientation to time 5 5  Orientation to Place 5 5  Registration 3 3  Attention/ Calculation 5 5  Recall 3 3  Language- name 2 objects 2 2  Language- repeat 1 1  Language- follow 3 step command 3 3  Language- read & follow direction 1 1  Write a sentence 1 1  Copy design 0 0  Total score 29 29     6CIT Screen 07/27/2021 07/06/2020 06/17/2018 10/18/2016  What Year? 0 points 0 points 0 points 0 points  What month? 0 points 0 points 0 points 0 points  What time? 0 points 0 points 0 points 0 points  Count back from 20 0 points 0 points 2 points 0 points  Months in reverse 4 points 0 points 0 points 0 points  Repeat phrase 0 points 0 points 0 points 0 points  Total Score 4 0 2 0    Immunizations Immunization History  Administered Date(s) Administered   Fluad Quad(high Dose 65+) 05/15/2019, 06/17/2020, 05/19/2021   H1N1 07/01/2008   Influenza Split 06/14/2012   Influenza Whole 06/22/2006, 05/27/2008, 06/10/2009, 06/09/2010, 05/10/2011   Influenza,inj,Quad PF,6+ Mos 06/09/2013, 08/12/2014, 07/15/2015, 04/19/2016, 07/04/2017, 06/17/2018   Pneumococcal Conjugate-13 03/18/2014   Pneumococcal Polysaccharide-23 06/09/2008   Tdap 04/22/2015    TDAP status: Up to date  Flu Vaccine status: Up to date  Pneumococcal vaccine status: Up to date  Covid-19 vaccine status: Declined, Education has been provided regarding the importance of this vaccine but patient still declined. Advised may receive this vaccine at local pharmacy or Health Dept.or vaccine clinic. Aware to provide a copy of the vaccination record if obtained from local pharmacy or Health Dept. Verbalized acceptance and understanding.  Qualifies for Shingles Vaccine? No   Zostavax completed No   Shingrix Completed?: No.    Education has been provided regarding the importance of this vaccine. Patient has been advised to call insurance company to determine out of pocket expense if they have not yet received this vaccine. Advised  may also receive vaccine at local pharmacy or Health Dept. Verbalized acceptance and understanding.  Screening Tests Health Maintenance  Topic Date Due   TETANUS/TDAP  04/21/2025   Pneumonia Vaccine 66+ Years old  Completed   INFLUENZA VACCINE  Completed   DEXA SCAN  Completed   HPV VACCINES  Aged Out   COVID-19 Vaccine  Discontinued   Zoster Vaccines- Shingrix  Discontinued    Health Maintenance  There are no preventive care reminders to display for this patient.   Colorectal cancer screening: No longer required.   Mammogram status: No longer required due to age.  Bone Density status: Completed yes. Results reflect: Bone density results: OSTEOPENIA. Repeat every aged out years.  Lung Cancer Screening: (Low Dose CT Chest recommended if Age 6-80 years, 30 pack-year currently smoking OR have quit w/in 15years.) does not qualify.   Lung Cancer Screening Referral: na  Additional Screening:  Hepatitis C Screening: does not qualify; Completed   Vision Screening: Recommended annual ophthalmology exams for early detection of glaucoma and other disorders of the eye. Is the patient up to date with their annual eye exam?  No  Who is the provider or what is the name of the office in which the patient attends annual eye exams? Dr Zigmund Daniel If pt is not established with a provider, would they like to be referred to a provider to establish care? No .   Dental Screening: Recommended annual dental exams for proper oral hygiene  Community Resource Referral / Chronic Care Management: CRR required this visit?  No   CCM required this visit?  No      Plan:     I have personally reviewed and noted the following in the patient's chart:   Medical and social history Use of alcohol, tobacco or illicit drugs  Current medications and supplements including opioid prescriptions.  Functional ability and status Nutritional status Physical activity Advanced directives List of other  physicians Hospitalizations, surgeries, and ER visits in previous 12 months Vitals Screenings to include cognitive, depression, and falls Referrals and appointments  In addition, I have reviewed and discussed with patient certain preventive protocols, quality metrics, and best practice recommendations. A written personalized care plan for preventive services as well as general preventive health recommendations were provided to patient.     Kate Sable, LPN, LPN   [id number]   Nurse Notes:  [name] , Thank you for taking time to come for your Medicare Wellness Visit. I appreciate your ongoing commitment to your health goals. Please review the following plan we discussed and let me know if I can assist you in the future.   These are the goals we discussed:  Goals       DIET - EAT MORE FRUITS AND VEGETABLES      Increase physical activity      Patient would like to be able to increase her physical activity so she can be more active with her great grandchildren.      LIFESTYLE - DECREASE FALLS RISK (pt-stated)      Keep doing what you are doing and use cane/walker when needed. Great job at no falls!        This is a list of the screening recommended for you and due dates:  Health Maintenance  Topic Date Due   Tetanus Vaccine  04/21/2025   Pneumonia Vaccine  Completed   Flu Shot  Completed   DEXA scan (bone density measurement)  Completed   HPV Vaccine  Aged Out   COVID-19 Vaccine  Discontinued   Zoster (Shingles) Vaccine  Discontinued

## 2021-07-28 ENCOUNTER — Ambulatory Visit (INDEPENDENT_AMBULATORY_CARE_PROVIDER_SITE_OTHER): Payer: Medicare Other

## 2021-07-28 DIAGNOSIS — I495 Sick sinus syndrome: Secondary | ICD-10-CM

## 2021-07-28 LAB — CUP PACEART REMOTE DEVICE CHECK
Battery Remaining Longevity: 72 mo
Battery Remaining Percentage: 63 %
Battery Voltage: 3.01 V
Brady Statistic RV Percent Paced: 62 %
Date Time Interrogation Session: 20221201024529
Implantable Lead Implant Date: 20110422
Implantable Lead Implant Date: 20110422
Implantable Lead Location: 753859
Implantable Lead Location: 753860
Implantable Pulse Generator Implant Date: 20180319
Lead Channel Impedance Value: 400 Ohm
Lead Channel Pacing Threshold Amplitude: 1 V
Lead Channel Pacing Threshold Pulse Width: 0.5 ms
Lead Channel Sensing Intrinsic Amplitude: 7.6 mV
Lead Channel Setting Pacing Amplitude: 2 V
Lead Channel Setting Pacing Pulse Width: 0.5 ms
Lead Channel Setting Sensing Sensitivity: 1 mV
Pulse Gen Model: 2272
Pulse Gen Serial Number: 8010902

## 2021-08-02 DIAGNOSIS — R0902 Hypoxemia: Secondary | ICD-10-CM | POA: Diagnosis not present

## 2021-08-04 ENCOUNTER — Encounter: Payer: Self-pay | Admitting: Family Medicine

## 2021-08-04 NOTE — Telephone Encounter (Signed)
I scheduled a telephone visit at 1:40 on the 13th.

## 2021-08-09 ENCOUNTER — Other Ambulatory Visit: Payer: Self-pay

## 2021-08-09 ENCOUNTER — Ambulatory Visit (INDEPENDENT_AMBULATORY_CARE_PROVIDER_SITE_OTHER): Payer: Medicare Other | Admitting: Family Medicine

## 2021-08-09 DIAGNOSIS — Z638 Other specified problems related to primary support group: Secondary | ICD-10-CM | POA: Diagnosis not present

## 2021-08-09 DIAGNOSIS — Z719 Counseling, unspecified: Secondary | ICD-10-CM

## 2021-08-09 NOTE — Progress Notes (Signed)
Remote pacemaker transmission.   

## 2021-08-09 NOTE — Progress Notes (Signed)
Virtual Visit via Telephone Note  I connected with [name] on 09/05/30 at  1:20 PM EST by telephone and verified that I am speaking with the correct person using two identifiers.  Location: Patient: home Provider: office   I discussed the limitations, risks, security and privacy concerns of performing an evaluation and management service by telephone and the availability of in person appointments. I also discussed with the patient that there may be a patient responsible charge related to this service. The patient expressed understanding and agreed to proceed.   History of Present Illness:   c/o increased stress due to family problems involving one of her children. States she has ahd a similar problem in the past regarding financial losses and now she is at a point where she does not want to either see or speak to the daughter involved, calling to vent and to get an opinion as to whether she should have Christmas meal with the daughter Reports getting the law involved, but deciding against pressing charges.Reports support from other family memvers , daughter and grand children Denies being suicidal or homicidal, states she is interested in therapy which she needs, concerned about the cost Observations/Objective:   Assessment and Plan:  Stress due to family tension Pt encouraged to continue to stay close to family, despite the current stress to continue to love and reach out to daughter, establishing the boundaries which she needs an dwill benefit from therapy which she agrees to  Follow Up Instructions:    I discussed the assessment and treatment plan with the patient. The patient was provided an opportunity to ask questions and all were answered. The patient agreed with the plan and demonstrated an understanding of the instructions.   The patient was advised to call back or seek an in-person evaluation if the symptoms worsen or if the condition fails to improve as anticipated.  I  provided 14 minutes of non-face-to-face time during this encounter.   Tula Nakayama, MD

## 2021-08-09 NOTE — Patient Instructions (Signed)
F/U as before, call if you need me before  I recommend that you stick you your family, go to Christmas dinner, and I am referring you to therapy  Thanks for choosing [facility], we consider it a privelige to serve you.

## 2021-08-16 ENCOUNTER — Encounter: Payer: Self-pay | Admitting: Family Medicine

## 2021-08-16 DIAGNOSIS — Z638 Other specified problems related to primary support group: Secondary | ICD-10-CM | POA: Insufficient documentation

## 2021-08-16 NOTE — Assessment & Plan Note (Signed)
Pt encouraged to continue to stay close to family, despite the current stress to continue to love and reach out to daughter, establishing the boundaries which she needs an dwill benefit from therapy which she agrees to

## 2021-08-18 ENCOUNTER — Ambulatory Visit (INDEPENDENT_AMBULATORY_CARE_PROVIDER_SITE_OTHER): Payer: Medicare Other | Admitting: Licensed Clinical Social Worker

## 2021-08-18 ENCOUNTER — Other Ambulatory Visit: Payer: Self-pay

## 2021-08-18 DIAGNOSIS — Z91199 Patient's noncompliance with other medical treatment and regimen due to unspecified reason: Secondary | ICD-10-CM

## 2021-08-23 ENCOUNTER — Other Ambulatory Visit: Payer: Self-pay

## 2021-08-23 ENCOUNTER — Ambulatory Visit (INDEPENDENT_AMBULATORY_CARE_PROVIDER_SITE_OTHER): Payer: Medicare Other | Admitting: *Deleted

## 2021-08-23 DIAGNOSIS — Z5181 Encounter for therapeutic drug level monitoring: Secondary | ICD-10-CM | POA: Diagnosis not present

## 2021-08-23 DIAGNOSIS — I480 Paroxysmal atrial fibrillation: Secondary | ICD-10-CM

## 2021-08-23 LAB — POCT INR: INR: 2.7 (ref 2.0–3.0)

## 2021-08-23 NOTE — Patient Instructions (Signed)
Continue warfarin 1/2 tablet daily except 1 tablet on Thursdays Recheck in 4 weeks Continue greens

## 2021-09-02 DIAGNOSIS — R0902 Hypoxemia: Secondary | ICD-10-CM | POA: Diagnosis not present

## 2021-09-02 NOTE — Progress Notes (Signed)
Left message encouraging contact 

## 2021-09-05 DIAGNOSIS — I10 Essential (primary) hypertension: Secondary | ICD-10-CM | POA: Diagnosis not present

## 2021-09-05 DIAGNOSIS — E7849 Other hyperlipidemia: Secondary | ICD-10-CM | POA: Diagnosis not present

## 2021-09-06 ENCOUNTER — Encounter: Payer: Self-pay | Admitting: Cardiology

## 2021-09-06 LAB — CMP14+EGFR
ALT: 6 IU/L (ref 0–32)
AST: 14 IU/L (ref 0–40)
Albumin/Globulin Ratio: 1.4 (ref 1.2–2.2)
Albumin: 4.2 g/dL (ref 3.5–4.6)
Alkaline Phosphatase: 123 IU/L — ABNORMAL HIGH (ref 44–121)
BUN/Creatinine Ratio: 14 (ref 12–28)
BUN: 27 mg/dL (ref 10–36)
Bilirubin Total: 0.4 mg/dL (ref 0.0–1.2)
CO2: 25 mmol/L (ref 20–29)
Calcium: 9.9 mg/dL (ref 8.7–10.3)
Chloride: 100 mmol/L (ref 96–106)
Creatinine, Ser: 1.91 mg/dL — ABNORMAL HIGH (ref 0.57–1.00)
Globulin, Total: 2.9 g/dL (ref 1.5–4.5)
Glucose: 108 mg/dL — ABNORMAL HIGH (ref 70–99)
Potassium: 5.4 mmol/L — ABNORMAL HIGH (ref 3.5–5.2)
Sodium: 139 mmol/L (ref 134–144)
Total Protein: 7.1 g/dL (ref 6.0–8.5)
eGFR: 24 mL/min/{1.73_m2} — ABNORMAL LOW (ref >59)

## 2021-09-06 LAB — TSH: TSH: 2.14 u[IU]/mL (ref 0.450–4.500)

## 2021-09-06 LAB — LIPID PANEL
Chol/HDL Ratio: 2.4 ratio (ref 0.0–4.4)
Cholesterol, Total: 144 mg/dL (ref 100–199)
HDL: 59 mg/dL (ref >39)
LDL Chol Calc (NIH): 60 mg/dL (ref 0–99)
Triglycerides: 147 mg/dL (ref 0–149)
VLDL Cholesterol Cal: 25 mg/dL (ref 5–40)

## 2021-09-07 NOTE — Telephone Encounter (Signed)
Decrease in kidney function. Verify she is taking torsemide 40mg  alternating days with 20mg , if so hold torsemide x 2 days then resume at just 20mg  daily. Potassium elevated, please clarify if she is taking any potassium at home? Repeat bmet/mg in 2 weeks.    Zandra Abts MD

## 2021-09-08 ENCOUNTER — Encounter: Payer: Self-pay | Admitting: Family Medicine

## 2021-09-08 ENCOUNTER — Ambulatory Visit (INDEPENDENT_AMBULATORY_CARE_PROVIDER_SITE_OTHER): Payer: Medicare Other | Admitting: Family Medicine

## 2021-09-08 ENCOUNTER — Other Ambulatory Visit: Payer: Self-pay

## 2021-09-08 DIAGNOSIS — E7849 Other hyperlipidemia: Secondary | ICD-10-CM | POA: Diagnosis not present

## 2021-09-08 DIAGNOSIS — G4734 Idiopathic sleep related nonobstructive alveolar hypoventilation: Secondary | ICD-10-CM

## 2021-09-08 DIAGNOSIS — E038 Other specified hypothyroidism: Secondary | ICD-10-CM

## 2021-09-08 DIAGNOSIS — Z9181 History of falling: Secondary | ICD-10-CM | POA: Diagnosis not present

## 2021-09-08 DIAGNOSIS — N184 Chronic kidney disease, stage 4 (severe): Secondary | ICD-10-CM | POA: Diagnosis not present

## 2021-09-08 DIAGNOSIS — I480 Paroxysmal atrial fibrillation: Secondary | ICD-10-CM | POA: Diagnosis not present

## 2021-09-08 DIAGNOSIS — F411 Generalized anxiety disorder: Secondary | ICD-10-CM

## 2021-09-08 DIAGNOSIS — R413 Other amnesia: Secondary | ICD-10-CM

## 2021-09-08 HISTORY — DX: Chronic kidney disease, stage 4 (severe): N18.4

## 2021-09-08 NOTE — Patient Instructions (Addendum)
F/y in 4 months, call if you need me sooner  No changes in medication  You are referred to Nephrologist as you have chronic kidney disease, no pain medication except tylenol  Be careful not to fall!  Thanks for choosing [facility], we consider it a privelige to serve you.

## 2021-09-12 ENCOUNTER — Encounter: Payer: Self-pay | Admitting: Family Medicine

## 2021-09-12 NOTE — Telephone Encounter (Signed)
Nephrologist would be helpful in the sense could look for other issues that could be stressing the kidney other than just the fluid pills,I think would be good idea  Zandra Abts MD

## 2021-09-12 NOTE — Assessment & Plan Note (Signed)
Continue daily aricept 

## 2021-09-12 NOTE — Assessment & Plan Note (Signed)
Controlled, no change in medication  

## 2021-09-12 NOTE — Assessment & Plan Note (Signed)
Nephrology to evaluate and manage, referral sent

## 2021-09-12 NOTE — Progress Notes (Signed)
° °  [name]     MRN: [id number]      DOB: 23-Oct-1925   HPI Ms. Corti is here for follow up and re-evaluation of chronic medical conditions, medication management and review of any available recent lab and radiology data.  .   Questions or concerns regarding consultations or procedures which the PT has had in the interim are  addressed. The PT denies any adverse reactions to current medications since the last visit.  She is accompanied by her daughter and states that family disarray persists but she is dealing with this the best she can  ROS Denies recent fever or chills. Denies  uncontrolled sinus pressure, nasal congestion, ear pain or sore throat. Denies chest congestion, productive cough or wheezing. Denies chest pains, palpitations and leg swelling Denies abdominal pain, nausea, vomiting,diarrhea or constipation.   Denies dysuria, frequency, hesitancy or incontinence. Chronic  joint pain, swelling and limitation in mobility. Denies headaches, seizures, numbness, or tingling. Denies uncontrolled depression, anxiety or insomnia.Does not  want therapy Denies skin break down or rash.   PE  BP 116/72    Pulse (!) 101    Resp 16    Ht 5\' 6"  (1.676 m)    Wt 160 lb 3.2 oz (72.7 kg)    SpO2 97%    BMI 25.86 kg/m   Patient alert and oriented and in no cardiopulmonary distress.  HEENT: No facial asymmetry, EOMI,     Neck decreased ROM.  Chest: Clear to auscultation bilaterally.decreased air entry  CVS: S1, S2  no S3.Regular rate.  ABD: Soft non tender.   Ext: No edema  MS: decreased  ROM spine, shoulders, hips and knees.  Skin: Intact, no ulcerations or rash noted.  Psych: Good eye contact, normal affect. Memory intact not anxious or depressed appearing.  CNS: CN 2-12 intact, power,  normal throughout.no focal deficits noted.   Assessment & Plan  Hypothyroidism Controlled, no change in medication   Hyperlipidemia Hyperlipidemia:Low fat diet discussed and  encouraged.   Lipid Panel  Lab Results  Component Value Date   CHOL 144 09/05/2021   HDL 59 09/05/2021   LDLCALC 60 09/05/2021   TRIG 147 09/05/2021   CHOLHDL 2.4 09/05/2021   Controlled, no change in medication     CKD (chronic kidney disease) stage 4, GFR 15-29 ml/min [facility]) Nephrology to evaluate and manage, referral sent  At high risk for falls Home safety discussed, no falls reported  Paroxysmal atrial fibrillation (HCC) Rate controlled  has pacemeaker  Nocturnal hypoxia Reliant on nocturnal oxygen and is compliant  GAD (generalized anxiety disorder) Improved control on buspar  Impaired memory Continue daily aricept

## 2021-09-12 NOTE — Assessment & Plan Note (Signed)
Improved control on buspar

## 2021-09-12 NOTE — Assessment & Plan Note (Signed)
Hyperlipidemia:Low fat diet discussed and encouraged.   Lipid Panel  Lab Results  Component Value Date   CHOL 144 09/05/2021   HDL 59 09/05/2021   LDLCALC 60 09/05/2021   TRIG 147 09/05/2021   CHOLHDL 2.4 09/05/2021   Controlled, no change in medication

## 2021-09-12 NOTE — Assessment & Plan Note (Signed)
Reliant on nocturnal oxygen and is compliant

## 2021-09-12 NOTE — Assessment & Plan Note (Signed)
Home safety discussed, no falls reported

## 2021-09-12 NOTE — Assessment & Plan Note (Signed)
Rate controlled  has pacemeaker

## 2021-09-13 ENCOUNTER — Other Ambulatory Visit: Payer: Self-pay | Admitting: *Deleted

## 2021-09-13 DIAGNOSIS — I10 Essential (primary) hypertension: Secondary | ICD-10-CM

## 2021-09-13 DIAGNOSIS — N1832 Chronic kidney disease, stage 3b: Secondary | ICD-10-CM

## 2021-09-20 ENCOUNTER — Other Ambulatory Visit: Payer: Self-pay

## 2021-09-20 ENCOUNTER — Ambulatory Visit (INDEPENDENT_AMBULATORY_CARE_PROVIDER_SITE_OTHER): Payer: Medicare Other | Admitting: *Deleted

## 2021-09-20 DIAGNOSIS — Z5181 Encounter for therapeutic drug level monitoring: Secondary | ICD-10-CM

## 2021-09-20 DIAGNOSIS — I480 Paroxysmal atrial fibrillation: Secondary | ICD-10-CM | POA: Diagnosis not present

## 2021-09-20 LAB — POCT INR: INR: 2.4 (ref 2.0–3.0)

## 2021-09-20 NOTE — Patient Instructions (Signed)
Continue warfarin 1/2 tablet daily except 1 tablet on Thursdays Recheck in 4 weeks Continue greens

## 2021-09-22 DIAGNOSIS — I10 Essential (primary) hypertension: Secondary | ICD-10-CM | POA: Diagnosis not present

## 2021-09-22 DIAGNOSIS — N1832 Chronic kidney disease, stage 3b: Secondary | ICD-10-CM | POA: Diagnosis not present

## 2021-09-23 ENCOUNTER — Other Ambulatory Visit: Payer: Self-pay | Admitting: Physician Assistant

## 2021-09-23 LAB — BASIC METABOLIC PANEL
BUN/Creatinine Ratio: 17 (ref 12–28)
BUN: 26 mg/dL (ref 10–36)
CO2: 23 mmol/L (ref 20–29)
Calcium: 9.1 mg/dL (ref 8.7–10.3)
Chloride: 104 mmol/L (ref 96–106)
Creatinine, Ser: 1.49 mg/dL — ABNORMAL HIGH (ref 0.57–1.00)
Glucose: 95 mg/dL (ref 70–99)
Potassium: 4.6 mmol/L (ref 3.5–5.2)
Sodium: 142 mmol/L (ref 134–144)
eGFR: 33 mL/min/{1.73_m2} — ABNORMAL LOW (ref >59)

## 2021-09-23 LAB — MAGNESIUM: Magnesium: 2 mg/dL (ref 1.6–2.3)

## 2021-09-26 ENCOUNTER — Encounter: Payer: Self-pay | Admitting: Cardiology

## 2021-10-03 ENCOUNTER — Telehealth: Payer: Self-pay

## 2021-10-03 DIAGNOSIS — R0902 Hypoxemia: Secondary | ICD-10-CM | POA: Diagnosis not present

## 2021-10-03 NOTE — Telephone Encounter (Signed)
-----   Message from Arnoldo Lenis, MD sent at 10/02/2021  4:49 PM EST ----- Kidney function remains decreased but much improved from labs 3 weeks ago. Would continue torsemide 20 mg daily, may take additional 20mg  as needed for swelling or 3+ lbs weight gain  Zandra Abts MD

## 2021-10-03 NOTE — Telephone Encounter (Signed)
Spoke to pt's daughter (per DPR) who verbalized understanding of result note and had no other questions or concerns at this time.

## 2021-10-18 ENCOUNTER — Ambulatory Visit (INDEPENDENT_AMBULATORY_CARE_PROVIDER_SITE_OTHER): Payer: Medicare Other | Admitting: *Deleted

## 2021-10-18 DIAGNOSIS — I480 Paroxysmal atrial fibrillation: Secondary | ICD-10-CM | POA: Diagnosis not present

## 2021-10-18 DIAGNOSIS — Z5181 Encounter for therapeutic drug level monitoring: Secondary | ICD-10-CM

## 2021-10-18 LAB — POCT INR: INR: 2.3 (ref 2.0–3.0)

## 2021-10-18 NOTE — Patient Instructions (Signed)
Continue warfarin 1/2 tablet daily except 1 tablet on Thursdays Recheck in 6 weeks Continue greens

## 2021-10-27 ENCOUNTER — Ambulatory Visit (INDEPENDENT_AMBULATORY_CARE_PROVIDER_SITE_OTHER): Payer: Medicare Other

## 2021-10-27 ENCOUNTER — Other Ambulatory Visit: Payer: Self-pay | Admitting: Cardiology

## 2021-10-27 DIAGNOSIS — I495 Sick sinus syndrome: Secondary | ICD-10-CM

## 2021-10-27 LAB — CUP PACEART REMOTE DEVICE CHECK
Battery Remaining Longevity: 68 mo
Battery Remaining Percentage: 61 %
Battery Voltage: 3.01 V
Brady Statistic RV Percent Paced: 65 %
Date Time Interrogation Session: 20230302020015
Implantable Lead Implant Date: 20110422
Implantable Lead Implant Date: 20110422
Implantable Lead Location: 753859
Implantable Lead Location: 753860
Implantable Pulse Generator Implant Date: 20180319
Lead Channel Impedance Value: 400 Ohm
Lead Channel Pacing Threshold Amplitude: 1 V
Lead Channel Pacing Threshold Pulse Width: 0.5 ms
Lead Channel Sensing Intrinsic Amplitude: 7 mV
Lead Channel Setting Pacing Amplitude: 2 V
Lead Channel Setting Pacing Pulse Width: 0.5 ms
Lead Channel Setting Sensing Sensitivity: 1 mV
Pulse Gen Model: 2272
Pulse Gen Serial Number: 8010902

## 2021-10-31 DIAGNOSIS — R0902 Hypoxemia: Secondary | ICD-10-CM | POA: Diagnosis not present

## 2021-11-03 NOTE — Progress Notes (Signed)
Remote pacemaker transmission.   

## 2021-11-14 ENCOUNTER — Other Ambulatory Visit: Payer: Self-pay | Admitting: Family Medicine

## 2021-11-29 ENCOUNTER — Ambulatory Visit (INDEPENDENT_AMBULATORY_CARE_PROVIDER_SITE_OTHER): Payer: Medicare Other | Admitting: *Deleted

## 2021-11-29 DIAGNOSIS — Z5181 Encounter for therapeutic drug level monitoring: Secondary | ICD-10-CM | POA: Diagnosis not present

## 2021-11-29 DIAGNOSIS — I480 Paroxysmal atrial fibrillation: Secondary | ICD-10-CM | POA: Diagnosis not present

## 2021-11-29 LAB — POCT INR: INR: 2.5 (ref 2.0–3.0)

## 2021-11-29 NOTE — Patient Instructions (Signed)
Continue warfarin 1/2 tablet daily except 1 tablet on Thursdays ?Recheck in 6 weeks ?Continue greens ?

## 2021-12-01 DIAGNOSIS — R0902 Hypoxemia: Secondary | ICD-10-CM | POA: Diagnosis not present

## 2021-12-31 ENCOUNTER — Encounter: Payer: Self-pay | Admitting: Cardiology

## 2021-12-31 DIAGNOSIS — R0902 Hypoxemia: Secondary | ICD-10-CM | POA: Diagnosis not present

## 2022-01-04 ENCOUNTER — Ambulatory Visit: Payer: Medicare Other | Admitting: Cardiology

## 2022-01-04 ENCOUNTER — Encounter: Payer: Self-pay | Admitting: Cardiology

## 2022-01-04 VITALS — BP 116/60 | HR 70 | Ht 65.0 in | Wt 165.0 lb

## 2022-01-04 DIAGNOSIS — E782 Mixed hyperlipidemia: Secondary | ICD-10-CM | POA: Diagnosis not present

## 2022-01-04 DIAGNOSIS — R600 Localized edema: Secondary | ICD-10-CM | POA: Diagnosis not present

## 2022-01-04 DIAGNOSIS — I10 Essential (primary) hypertension: Secondary | ICD-10-CM

## 2022-01-04 DIAGNOSIS — I480 Paroxysmal atrial fibrillation: Secondary | ICD-10-CM | POA: Diagnosis not present

## 2022-01-04 NOTE — Progress Notes (Signed)
? ? ? ?Clinical Summary ?[name] is a 88 y.o.femaleseen today for follow up of the following medical problems ?  ?  ?1. Paroxysmal afib ?- Has not been interested in NOACs. Has been on coumadin ? ?- no recent palpitations ?- compliant with meds ?- no bleeding on coumadin.  ? ? ?  ?  ?2. Symptomatic bradycardia with pacemaker ?- followed by EP ?- 10/2021 normal device check ?  ?3. HTN ?- compliant with meds ?- she is compliant with meds ?  ?  ?  ?4. Hyperlipidemia ? - she is on lovstatin ?  ?02/2021 TC 143 TG 134 HDL 60 LDL 60 ?Jan 2023 TC 144 TG 147 HDL 59 LDL 60 ?  ? 5. Edema ?- 06/24/20 labs Cr 1.2 BUN 26 BNP 2779  ?- 06/2020 echo LVEF 55-60%, indet DDx ? - swelling is up and down, though primarily controled ?- taking toresmide 40mg  alteranting days with 20mg  ?- home weights 159-161 lbs which is stable over time ? ?Sep 07 2021 lowered torsemide from 40mg  alternating days with 20mg  to 20mg  daily. Cr had elevated to 1.91, with change down to 1.49 which was prior baseline.  ? ?- home weight trended up 162-166 lbs ?- taking torsemide 20mg  daily, rarely will take additional tablet for weight gain.  ? ?  ?SH: daughter of [name] who is also a patient of mine. She has a great grandsone here with her today started kindergarten, a great grandaughter who is 6 yos ? ? ?Past Medical History:  ?Diagnosis Date  ? Allergy   ? Annual physical exam 04/22/2015  ? Anxiety   ? Arthritis   ? Arthritis   ? Asthma   ? Atrial fibrillation ([place])   ? Bleeding disorder ([place])   ? CHF (congestive heart failure) ([place])   ? CKD (chronic kidney disease) stage 4, GFR 15-29 ml/min (HCC) 09/08/2021  ? COPD (chronic obstructive pulmonary disease) ([place])   ? Depression   ? Gastroesophageal reflux disease   ? Hearing impairment   ? Heart disease   ? Heart murmur   ? High cholesterol   ? Hyperlipidemia   ? Hypertension   ? Hypothyroidism   ? Impaired glucose tolerance   ? Macular degeneration   ? Oxygen deficiency   ? Pancreatitis   ? Paroxysmal  atrial fibrillation ([place]) 2011  ? Sick sinus syndrome [facility]) 2011  ? Atrial fibrilation 10/2009; and dual chamber pacemaker in 4/11; normal EF  ? Sleep apnea   ? Nocturnal oxygen therapy  ? Zoster 2016  ? ? ? ?No Known Allergies ? ? ?Current Outpatient Medications  ?Medication Sig Dispense Refill  ? Ascorbic Acid (VITAMIN C PO) Take 1 tablet by mouth daily.    ? busPIRone (BUSPAR) 5 MG tablet Take 1 tablet (5 mg total) by mouth 2 (two) times daily. 60 tablet 5  ? Calcium Carb-Cholecalciferol 500-400 MG-UNIT CHEW Chew 1 tablet by mouth daily.  60 tablet   ? Cholecalciferol (VITAMIN D3 PO) Take 2 tablets by mouth daily.    ? Cyanocobalamin (VITAMIN B12 PO) Take 1 tablet by mouth daily.    ? diltiazem (CARDIZEM CD) 300 MG 24 hr capsule Take 1 capsule (300 mg total) by mouth daily. 90 capsule 3  ? donepezil (ARICEPT) 10 MG tablet TAKE (1) TABLET BY MOUTH AT BEDTIME. 90 tablet 0  ? fluticasone (FLONASE) 50 MCG/ACT nasal spray Place 1 spray into both nostrils daily.    ? levothyroxine (SYNTHROID) 112 MCG tablet Take  1 tablet (112 mcg total) by mouth daily. 90 tablet 3  ? loratadine (CLARITIN) 10 MG tablet Take 1 tablet (10 mg total) by mouth daily. 30 tablet 1  ? lovastatin (MEVACOR) 40 MG tablet TAKE ONE TABLET BY MOUTH AT BEDTIME. 90 tablet 4  ? meclizine (ANTIVERT) 12.5 MG tablet Take 1 tablet (12.5 mg total) by mouth 2 (two) times daily as needed for dizziness. 30 tablet 0  ? metoprolol tartrate (LOPRESSOR) 100 MG tablet Take 1 tablet (100 mg total) by mouth 2 (two) times daily. 180 tablet 3  ? metoprolol tartrate (LOPRESSOR) 25 MG tablet TAKE (1) TABLET BY MOUTH TWICE DAILY. (TAKE WITH 100MG ) 180 tablet 2  ? Multiple Vitamins-Minerals (EYE VITAMINS & MINERALS PO) Take 1 tablet by mouth daily.    ? omeprazole (PRILOSEC) 40 MG capsule TAKE (1) CAPSULE BY MOUTH ONCE DAILY FOR ACID REFLUX. 90 capsule 1  ? OXYGEN-HELIUM IN Inhale 2 L into the lungs at bedtime.    ? torsemide (DEMADEX) 20 MG tablet Take 40 mg (2 tablets)  every other day alternating with 20 mg (1 tablet). 270 tablet 3  ? warfarin (COUMADIN) 5 MG tablet TAKE 1/2 TABLET BY MOUTH DAILY OR AS DIRECTED. 25 tablet 5  ? zinc gluconate 50 MG tablet Take 50 mg by mouth daily.    ? ?Current Facility-Administered Medications  ?Medication Dose Route Frequency Provider Last Rate Last Admin  ? miconazole (MONISTAT 7) 2 % vaginal cream 1 Applicatorful  1 Applicatorful Vaginal QHS Jonnie Kind, MD      ? ? ? ?Past Surgical History:  ?Procedure Laterality Date  ? ABDOMINAL HYSTERECTOMY    ? CATARACT EXTRACTION    ? Bilateral  ? COLONOSCOPY  08/29/2007  ? Dr. Gala Romney  ? EYE SURGERY    ? laser   ? PACEMAKER INSERTION  08/28/2009  ? dual chamber   ? PPM GENERATOR CHANGEOUT N/A 11/13/2016  ? Procedure: PPM Generator Changeout;  Surgeon: Evans Lance, MD;  Location: [facility];  Service: Cardiovascular;  Laterality: N/A;  ? SALPINGOOPHORECTOMY  08/28/1968  ? right  ? ? ? ?No Known Allergies ? ? ? ?Family History  ?Problem Relation Age of Onset  ? Cancer Mother   ?     gential  ? Cancer Father   ?     throat   ? Pneumonia Sister   ? Emphysema Sister   ? Mitral valve prolapse Sister   ? Heart disease Other   ? Arthritis Other   ? Lung disease Other   ? Asthma Other   ? Melanoma Son   ? Anxiety disorder Daughter   ? Arthritis Daughter   ? Asthma Daughter   ? COPD Daughter   ? Depression Daughter   ? Kidney disease Daughter   ? Obesity Daughter   ? Anxiety disorder Daughter   ? Anxiety disorder Daughter   ? Arthritis Daughter   ? ? ? ?Social History ?Ms. Schoeller reports that she has never smoked. She has never used smokeless tobacco. ?Ms. Sawyer reports no history of alcohol use. ? ? ?Review of Systems ?CONSTITUTIONAL: No weight loss, fever, chills, weakness or fatigue.  ?HEENT: Eyes: No visual loss, blurred vision, double vision or yellow sclerae.No hearing loss, sneezing, congestion, runny nose or sore throat.  ?SKIN: No rash or itching.  ?CARDIOVASCULAR: per hpi ?RESPIRATORY: No  shortness of breath, cough or sputum.  ?GASTROINTESTINAL: No anorexia, nausea, vomiting or diarrhea. No abdominal pain or blood.  ?GENITOURINARY:  No burning on urination, no polyuria ?NEUROLOGICAL: No headache, dizziness, syncope, paralysis, ataxia, numbness or tingling in the extremities. No change in bowel or bladder control.  ?MUSCULOSKELETAL: No muscle, back pain, joint pain or stiffness.  ?LYMPHATICS: No enlarged nodes. No history of splenectomy.  ?PSYCHIATRIC: No history of depression or anxiety.  ?ENDOCRINOLOGIC: No reports of sweating, cold or heat intolerance. No polyuria or polydipsia.  ?. ? ? ?Physical Examination ?Today's Vitals  ? 01/04/22 1104  ?BP: 116/60  ?Pulse: 70  ?SpO2: 98%  ?Weight: 165 lb (74.8 kg)  ?Height: 5\' 5"  (1.651 m)  ? ?Body mass index is 27.46 kg/m?. ? ?Gen: resting comfortably, no acute distress ?HEENT: no scleral icterus, pupils equal round and reactive, no palptable cervical adenopathy,  ?CV: irreg, no m/rg no jvd ?Resp: Clear to auscultation bilaterally ?GI: abdomen is soft, non-tender, non-distended, normal bowel sounds, no hepatosplenomegaly ?MSK: extremities are warm, no edema.  ?Skin: warm, no rash ?Neuro:  no focal deficits ?Psych: appropriate affect ? ? ?Diagnostic Studies ? ? ?07/04/19 echo ?IMPRESSIONS ?  ?  ? 1. Left ventricular ejection fraction, by visual estimation, is 65 to 70%. The left ventricle has normal function. There is mildly increased left ventricular hypertrophy. ? 2. Left ventricular diastolic parameters are indeterminate in the setting of atrial fibrillation. ? 3. Global right ventricle has normal systolic function.The right ventricular size is mildly enlarged. No increase in right ventricular wall thickness. ? 4. Left atrial size was upper normal. ? 5. Right atrial size was upper normal. ? 6. Mild mitral annular calcification. ? 7. Moderate aortic valve annular calcification. ? 8. The mitral valve is grossly normal. Mild mitral valve regurgitation. ? 9. The  tricuspid valve is grossly normal. Tricuspid valve regurgitation is trivial. ?10. The aortic valve is tricuspid. Aortic valve regurgitation is not visualized. Mild aortic valve sclerosis without stenos

## 2022-01-04 NOTE — Patient Instructions (Addendum)
Medication Instructions:  ?No changes ? ?Labwork: ?BMET/MAG ? ?Testing/Procedures: ?None ? ?Follow-Up: ?Follow up with Dr. Harl Bowie in 6 months.  ? ?Any Other Special Instructions Will Be Listed Below (If Applicable). ? ? ? ? ?If you need a refill on your cardiac medications before your next appointment, please call your pharmacy. ? ?

## 2022-01-06 ENCOUNTER — Ambulatory Visit (INDEPENDENT_AMBULATORY_CARE_PROVIDER_SITE_OTHER): Payer: Medicare Other | Admitting: Family Medicine

## 2022-01-06 ENCOUNTER — Encounter: Payer: Self-pay | Admitting: Family Medicine

## 2022-01-06 VITALS — BP 116/74 | HR 110 | Resp 16 | Ht 65.0 in | Wt 165.0 lb

## 2022-01-06 DIAGNOSIS — E559 Vitamin D deficiency, unspecified: Secondary | ICD-10-CM | POA: Diagnosis not present

## 2022-01-06 DIAGNOSIS — R7301 Impaired fasting glucose: Secondary | ICD-10-CM | POA: Diagnosis not present

## 2022-01-06 DIAGNOSIS — E038 Other specified hypothyroidism: Secondary | ICD-10-CM | POA: Diagnosis not present

## 2022-01-06 DIAGNOSIS — E7849 Other hyperlipidemia: Secondary | ICD-10-CM | POA: Diagnosis not present

## 2022-01-06 DIAGNOSIS — I480 Paroxysmal atrial fibrillation: Secondary | ICD-10-CM

## 2022-01-06 DIAGNOSIS — Z638 Other specified problems related to primary support group: Secondary | ICD-10-CM

## 2022-01-06 DIAGNOSIS — I10 Essential (primary) hypertension: Secondary | ICD-10-CM | POA: Diagnosis not present

## 2022-01-06 DIAGNOSIS — F411 Generalized anxiety disorder: Secondary | ICD-10-CM

## 2022-01-06 MED ORDER — BUSPIRONE HCL 7.5 MG PO TABS: 7.5000 mg | ORAL_TABLET | Freq: Two times a day (BID) | ORAL | 4 refills | Status: DC

## 2022-01-06 NOTE — Patient Instructions (Addendum)
Annual exam end August , flu vaccine at visit ?, call if you need me sooner ? ?Fasting lipid, cmp and EGFr, cBC, TSH and vit D 1 week before August appt ? ?Please submit urine for testing  next week due to c/o odor ? ?Careful not to fall ? ?Increase buspar to 7.5 mg dose twice daily ? ?Thanks for choosing [facility], we consider it a privelige to serve you. ? ?

## 2022-01-08 ENCOUNTER — Encounter: Payer: Self-pay | Admitting: Family Medicine

## 2022-01-08 NOTE — Assessment & Plan Note (Signed)
Controlled, no change in medication Updated lab needed at/ before next visit.  

## 2022-01-08 NOTE — Assessment & Plan Note (Signed)
improving

## 2022-01-08 NOTE — Assessment & Plan Note (Signed)
Increase dose of buspar ?

## 2022-01-08 NOTE — Progress Notes (Signed)
? ?  [name]     MRN: [id number]      DOB: 11-04-1925 ? ? ?HPI ?Ms. Rzasa is here for follow up and re-evaluation of chronic medical conditions, medication management and review of any available recent lab and radiology data.  ?Preventive health is updated, specifically  Cancer screening and Immunization.   ?Questions or concerns regarding consultations or procedures which the PT has had in the interim are  addressed. ?The PT denies any adverse reactions to current medications since the last visit.  ?C/o uncontrolled anxiety, pt an daughter request increase in dose of buspar ?Wants urine checked due to bad odor and h/o recurrent UTI will ring in specimen, unable to voiud sufficient at visit ?Improved relationships in the family ? ?ROS ?Denies recent fever or chills. ?Denies sinus pressure, nasal congestion, ear pain or sore throat. ?Denies chest congestion, productive cough or wheezing. ?Denies chest pains, palpitations and leg swelling ?Denies abdominal pain, nausea, vomiting,diarrhea or constipation.   ?Denies dysuria, frequency, hesitancy or incontinence. ?Denies joint pain, swelling and limitation in mobility. ?Denies headaches, seizures, numbness, or tingling. ? ?Denies skin break down or rash. ? ? ?PE ? ?BP 116/74   Pulse (!) 110   Resp 16   Ht 5\' 5"  (1.651 m)   Wt 165 lb (74.8 kg)   SpO2 97%   BMI 27.46 kg/m?  ? ?Patient alert and oriented and in no cardiopulmonary distress. ? ?HEENT: No facial asymmetry, EOMI,     Neck decreased ROM ? ?Chest: Clear to auscultation bilaterally. ? ?CVS: S1, S2 no murmurs, no S3.Regular rate. ? ?ABD: Soft non tender.  ? ?Ext: No edema ? ?NO:BSJGGEZMO ROM spine, shoulders, hips and knees. ? ?Skin: Intact, no ulcerations or rash noted. ? ?Psych: Good eye contact, normal affect. Memory intact mildly anxious not  depressed appearing. ? ?CNS: CN 2-12 intact, power,  normal throughout.no focal deficits noted. ? ? ?Assessment & Plan ? ?Essential hypertension ?Controlled, no  change in medication ?DASH diet and commitment to daily physical activity for a minimum of 30 minutes discussed and encouraged, as a part of hypertension management. ?The importance of attaining a healthy weight is also discussed. ? ? ?  01/06/2022  ?  2:13 PM 01/04/2022  ? 11:04 AM 09/08/2021  ?  3:06 PM 07/27/2021  ? 12:02 PM 07/19/2021  ?  9:06 AM 07/06/2021  ? 11:13 AM 04/15/2021  ?  2:55 PM  ?BP/Weight  ?Systolic BP 294 765 465 035 138 116 122  ?Diastolic BP 74 60 72 64 70 68 77  ?Wt. (Lbs) 165 165 160.2 164.6 160.2 161.2 162  ?BMI 27.46 kg/m2 27.46 kg/m2 25.86 kg/m2 26.57 kg/m2 25.86 kg/m2 26.02 kg/m2 26.15 kg/m2  ? ? ? ? ? ?Hypothyroidism ?Controlled, no change in medication ?Updated lab needed at/ before next visit. ? ? ?Hyperlipidemia ?Hyperlipidemia:Low fat diet discussed and encouraged. ? ? ?Lipid Panel  ?Lab Results  ?Component Value Date  ? CHOL 144 09/05/2021  ? HDL 59 09/05/2021  ? [place] 60 09/05/2021  ? TRIG 147 09/05/2021  ? CHOLHDL 2.4 09/05/2021  ? ? ? ?Updated lab needed at/ before next visit. ? ? ?Paroxysmal atrial fibrillation (HCC) ?Rate controlled and followed by Cardiology ? ?Stress due to family tension ?improving ? ?GAD (generalized anxiety disorder) ?Increase dose of buspar ? ?

## 2022-01-08 NOTE — Assessment & Plan Note (Signed)
Controlled, no change in medication ?DASH diet and commitment to daily physical activity for a minimum of 30 minutes discussed and encouraged, as a part of hypertension management. ?The importance of attaining a healthy weight is also discussed. ? ? ?  01/06/2022  ?  2:13 PM 01/04/2022  ? 11:04 AM 09/08/2021  ?  3:06 PM 07/27/2021  ? 12:02 PM 07/19/2021  ?  9:06 AM 07/06/2021  ? 11:13 AM 04/15/2021  ?  2:55 PM  ?BP/Weight  ?Systolic BP 987 215 872 761 138 116 122  ?Diastolic BP 74 60 72 64 70 68 77  ?Wt. (Lbs) 165 165 160.2 164.6 160.2 161.2 162  ?BMI 27.46 kg/m2 27.46 kg/m2 25.86 kg/m2 26.57 kg/m2 25.86 kg/m2 26.02 kg/m2 26.15 kg/m2  ? ? ? ? ?

## 2022-01-08 NOTE — Assessment & Plan Note (Signed)
Hyperlipidemia:Low fat diet discussed and encouraged. ? ? ?Lipid Panel  ?Lab Results  ?Component Value Date  ? CHOL 144 09/05/2021  ? HDL 59 09/05/2021  ? [place] 60 09/05/2021  ? TRIG 147 09/05/2021  ? CHOLHDL 2.4 09/05/2021  ? ? ? ?Updated lab needed at/ before next visit. ? ?

## 2022-01-08 NOTE — Assessment & Plan Note (Signed)
Rate controlled and followed by Cardiology ?

## 2022-01-09 ENCOUNTER — Encounter: Payer: Self-pay | Admitting: Cardiology

## 2022-01-10 ENCOUNTER — Other Ambulatory Visit: Payer: Self-pay

## 2022-01-10 ENCOUNTER — Ambulatory Visit: Payer: Medicare Other | Admitting: *Deleted

## 2022-01-10 DIAGNOSIS — I480 Paroxysmal atrial fibrillation: Secondary | ICD-10-CM | POA: Diagnosis not present

## 2022-01-10 DIAGNOSIS — Z5181 Encounter for therapeutic drug level monitoring: Secondary | ICD-10-CM

## 2022-01-10 DIAGNOSIS — Z79899 Other long term (current) drug therapy: Secondary | ICD-10-CM

## 2022-01-10 LAB — POCT INR: INR: 2.3 (ref 2.0–3.0)

## 2022-01-10 NOTE — Patient Instructions (Signed)
Continue warfarin 1/2 tablet daily except 1 tablet on Thursdays ?Recheck in 6 weeks ?Continue greens ?

## 2022-01-16 ENCOUNTER — Other Ambulatory Visit (INDEPENDENT_AMBULATORY_CARE_PROVIDER_SITE_OTHER): Payer: Medicare Other

## 2022-01-16 ENCOUNTER — Other Ambulatory Visit: Payer: Self-pay

## 2022-01-16 DIAGNOSIS — R8290 Unspecified abnormal findings in urine: Secondary | ICD-10-CM | POA: Diagnosis not present

## 2022-01-16 LAB — POCT URINALYSIS DIP (CLINITEK)
Bilirubin, UA: NEGATIVE
Glucose, UA: NEGATIVE mg/dL
Ketones, POC UA: NEGATIVE mg/dL
Nitrite, UA: NEGATIVE
POC PROTEIN,UA: NEGATIVE
Spec Grav, UA: 1.015 (ref 1.010–1.025)
Urobilinogen, UA: 0.2 E.U./dL
pH, UA: 5 (ref 5.0–8.0)

## 2022-01-17 ENCOUNTER — Encounter: Payer: Self-pay | Admitting: Family Medicine

## 2022-01-17 NOTE — Telephone Encounter (Signed)
Mychart message sent.

## 2022-01-19 ENCOUNTER — Other Ambulatory Visit: Payer: Self-pay

## 2022-01-19 ENCOUNTER — Other Ambulatory Visit: Payer: Self-pay | Admitting: Family Medicine

## 2022-01-19 DIAGNOSIS — Z79899 Other long term (current) drug therapy: Secondary | ICD-10-CM | POA: Diagnosis not present

## 2022-01-19 LAB — URINE CULTURE

## 2022-01-19 MED ORDER — AMOXICILLIN-POT CLAVULANATE 875-125 MG PO TABS: 1.0000 | ORAL_TABLET | Freq: Two times a day (BID) | ORAL | 0 refills | Status: DC

## 2022-01-19 NOTE — Progress Notes (Signed)
Stop doxy, Augmentin is prescribed

## 2022-01-20 LAB — BASIC METABOLIC PANEL
BUN/Creatinine Ratio: 15 (ref 12–28)
BUN: 24 mg/dL (ref 10–36)
CO2: 22 mmol/L (ref 20–29)
Calcium: 9.5 mg/dL (ref 8.7–10.3)
Chloride: 102 mmol/L (ref 96–106)
Creatinine, Ser: 1.61 mg/dL — ABNORMAL HIGH (ref 0.57–1.00)
Glucose: 102 mg/dL — ABNORMAL HIGH (ref 70–99)
Potassium: 4.1 mmol/L (ref 3.5–5.2)
Sodium: 141 mmol/L (ref 134–144)
eGFR: 29 mL/min/{1.73_m2} — ABNORMAL LOW (ref >59)

## 2022-01-20 LAB — MAGNESIUM: Magnesium: 2.1 mg/dL (ref 1.6–2.3)

## 2022-01-26 ENCOUNTER — Ambulatory Visit (INDEPENDENT_AMBULATORY_CARE_PROVIDER_SITE_OTHER): Payer: Medicare Other

## 2022-01-26 DIAGNOSIS — I495 Sick sinus syndrome: Secondary | ICD-10-CM | POA: Diagnosis not present

## 2022-01-26 LAB — CUP PACEART REMOTE DEVICE CHECK
Battery Remaining Longevity: 66 mo
Battery Remaining Percentage: 59 %
Battery Voltage: 3.01 V
Brady Statistic RV Percent Paced: 66 %
Date Time Interrogation Session: 20230601020013
Implantable Lead Implant Date: 20110422
Implantable Lead Implant Date: 20110422
Implantable Lead Location: 753859
Implantable Lead Location: 753860
Implantable Pulse Generator Implant Date: 20180319
Lead Channel Impedance Value: 380 Ohm
Lead Channel Pacing Threshold Amplitude: 1 V
Lead Channel Pacing Threshold Pulse Width: 0.5 ms
Lead Channel Sensing Intrinsic Amplitude: 5.8 mV
Lead Channel Setting Pacing Amplitude: 2 V
Lead Channel Setting Pacing Pulse Width: 0.5 ms
Lead Channel Setting Sensing Sensitivity: 1 mV
Pulse Gen Model: 2272
Pulse Gen Serial Number: 8010902

## 2022-01-29 ENCOUNTER — Encounter: Payer: Self-pay | Admitting: Family Medicine

## 2022-01-31 ENCOUNTER — Other Ambulatory Visit: Payer: Self-pay | Admitting: Student

## 2022-01-31 DIAGNOSIS — R0902 Hypoxemia: Secondary | ICD-10-CM | POA: Diagnosis not present

## 2022-02-01 ENCOUNTER — Telehealth: Payer: Self-pay | Admitting: Cardiology

## 2022-02-01 ENCOUNTER — Other Ambulatory Visit: Payer: Self-pay | Admitting: Family Medicine

## 2022-02-01 NOTE — Telephone Encounter (Signed)
Pt's daughter returning nurses call regarding pt's test results. Please advise

## 2022-02-01 NOTE — Telephone Encounter (Signed)
Arnoldo Lenis, MD  02/01/2022  4:07 PM EDT     Labs show just mild stress on kidneys, similar to prior range. COntinue to monitor at this time   Zandra Abts MD     Daughter notified

## 2022-02-03 NOTE — Progress Notes (Signed)
Remote pacemaker transmission.   

## 2022-02-10 ENCOUNTER — Other Ambulatory Visit: Payer: Self-pay | Admitting: Student

## 2022-02-18 ENCOUNTER — Other Ambulatory Visit: Payer: Self-pay | Admitting: Family Medicine

## 2022-02-20 ENCOUNTER — Other Ambulatory Visit: Payer: Self-pay

## 2022-02-20 MED ORDER — DONEPEZIL HCL 10 MG PO TABS: 10.0000 mg | ORAL_TABLET | Freq: Every day | ORAL | 0 refills | Status: DC

## 2022-02-20 MED ORDER — TORSEMIDE 20 MG PO TABS: ORAL_TABLET | ORAL | 0 refills | Status: DC

## 2022-02-21 ENCOUNTER — Other Ambulatory Visit: Payer: Self-pay | Admitting: Family Medicine

## 2022-02-21 ENCOUNTER — Ambulatory Visit: Payer: Medicare Other | Admitting: *Deleted

## 2022-02-21 DIAGNOSIS — Z5181 Encounter for therapeutic drug level monitoring: Secondary | ICD-10-CM | POA: Diagnosis not present

## 2022-02-21 DIAGNOSIS — I480 Paroxysmal atrial fibrillation: Secondary | ICD-10-CM | POA: Diagnosis not present

## 2022-02-21 LAB — POCT INR: INR: 2.7 (ref 2.0–3.0)

## 2022-03-02 DIAGNOSIS — R0902 Hypoxemia: Secondary | ICD-10-CM | POA: Diagnosis not present

## 2022-04-02 DIAGNOSIS — R0902 Hypoxemia: Secondary | ICD-10-CM | POA: Diagnosis not present

## 2022-04-04 ENCOUNTER — Ambulatory Visit: Payer: Medicare Other | Admitting: *Deleted

## 2022-04-04 DIAGNOSIS — I480 Paroxysmal atrial fibrillation: Secondary | ICD-10-CM

## 2022-04-04 DIAGNOSIS — Z5181 Encounter for therapeutic drug level monitoring: Secondary | ICD-10-CM

## 2022-04-04 LAB — POCT INR: INR: 3 (ref 2.0–3.0)

## 2022-04-04 NOTE — Patient Instructions (Signed)
Continue warfarin 1/2 tablet daily except 1 tablet on Thursdays Recheck in 6 weeks Continue greens

## 2022-04-10 ENCOUNTER — Other Ambulatory Visit: Payer: Self-pay | Admitting: Student

## 2022-04-18 DIAGNOSIS — I10 Essential (primary) hypertension: Secondary | ICD-10-CM | POA: Diagnosis not present

## 2022-04-18 DIAGNOSIS — E559 Vitamin D deficiency, unspecified: Secondary | ICD-10-CM | POA: Diagnosis not present

## 2022-04-18 DIAGNOSIS — E7849 Other hyperlipidemia: Secondary | ICD-10-CM | POA: Diagnosis not present

## 2022-04-18 DIAGNOSIS — D539 Nutritional anemia, unspecified: Secondary | ICD-10-CM | POA: Diagnosis not present

## 2022-04-19 LAB — CMP14+EGFR
ALT: 6 IU/L (ref 0–32)
AST: 14 IU/L (ref 0–40)
Albumin/Globulin Ratio: 1.4 (ref 1.2–2.2)
Albumin: 4 g/dL (ref 3.6–4.6)
Alkaline Phosphatase: 127 IU/L — ABNORMAL HIGH (ref 44–121)
BUN/Creatinine Ratio: 18 (ref 12–28)
BUN: 27 mg/dL (ref 10–36)
Bilirubin Total: 0.3 mg/dL (ref 0.0–1.2)
CO2: 23 mmol/L (ref 20–29)
Calcium: 9.3 mg/dL (ref 8.7–10.3)
Chloride: 101 mmol/L (ref 96–106)
Creatinine, Ser: 1.5 mg/dL — ABNORMAL HIGH (ref 0.57–1.00)
Globulin, Total: 2.9 g/dL (ref 1.5–4.5)
Glucose: 106 mg/dL — ABNORMAL HIGH (ref 70–99)
Potassium: 4.8 mmol/L (ref 3.5–5.2)
Sodium: 141 mmol/L (ref 134–144)
Total Protein: 6.9 g/dL (ref 6.0–8.5)
eGFR: 32 mL/min/{1.73_m2} — ABNORMAL LOW (ref >59)

## 2022-04-19 LAB — LIPID PANEL
Chol/HDL Ratio: 2.5 ratio (ref 0.0–4.4)
Cholesterol, Total: 138 mg/dL (ref 100–199)
HDL: 56 mg/dL (ref >39)
LDL Chol Calc (NIH): 60 mg/dL (ref 0–99)
Triglycerides: 123 mg/dL (ref 0–149)
VLDL Cholesterol Cal: 22 mg/dL (ref 5–40)

## 2022-04-19 LAB — CBC
Hematocrit: 30.5 % — ABNORMAL LOW (ref 34.0–46.6)
Hemoglobin: 9.9 g/dL — ABNORMAL LOW (ref 11.1–15.9)
MCH: 28.5 pg (ref 26.6–33.0)
MCHC: 32.5 g/dL (ref 31.5–35.7)
MCV: 88 fL (ref 79–97)
Platelets: 303 10*3/uL (ref 150–450)
RBC: 3.47 x10E6/uL — ABNORMAL LOW (ref 3.77–5.28)
RDW: 13.1 % (ref 11.7–15.4)
WBC: 9.7 10*3/uL (ref 3.4–10.8)

## 2022-04-19 LAB — VITAMIN D 25 HYDROXY (VIT D DEFICIENCY, FRACTURES): Vit D, 25-Hydroxy: 61.2 ng/mL (ref 30.0–100.0)

## 2022-04-19 LAB — TSH: TSH: 0.576 u[IU]/mL (ref 0.450–4.500)

## 2022-04-20 ENCOUNTER — Ambulatory Visit (INDEPENDENT_AMBULATORY_CARE_PROVIDER_SITE_OTHER): Payer: Medicare Other | Admitting: Family Medicine

## 2022-04-20 ENCOUNTER — Encounter: Payer: Self-pay | Admitting: Family Medicine

## 2022-04-20 VITALS — BP 107/72 | HR 108 | Resp 16 | Ht 66.0 in | Wt 164.0 lb

## 2022-04-20 DIAGNOSIS — Z0001 Encounter for general adult medical examination with abnormal findings: Secondary | ICD-10-CM | POA: Diagnosis not present

## 2022-04-20 DIAGNOSIS — R8290 Unspecified abnormal findings in urine: Secondary | ICD-10-CM | POA: Diagnosis not present

## 2022-04-20 DIAGNOSIS — L309 Dermatitis, unspecified: Secondary | ICD-10-CM

## 2022-04-20 MED ORDER — CLOBETASOL PROPIONATE 0.05 % EX CREA: 1.0000 | TOPICAL_CREAM | Freq: Two times a day (BID) | CUTANEOUS | 0 refills | Status: DC

## 2022-04-20 NOTE — Patient Instructions (Addendum)
F/U in early January, call if you need me sooner  Call and come for flu vaccine next week  Please bring in [place] as soon as possible  Clobetasol is prescribed for rash  Nurse please refill omeprazole x 6 additional months  Nurse please add iron and ferritin and B12  to recent lab  Thanks for choosing [facility], we consider it a privelige to serve you.

## 2022-04-24 ENCOUNTER — Telehealth: Payer: Self-pay | Admitting: Family Medicine

## 2022-04-24 ENCOUNTER — Other Ambulatory Visit (INDEPENDENT_AMBULATORY_CARE_PROVIDER_SITE_OTHER): Payer: Medicare Other | Admitting: Family Medicine

## 2022-04-24 ENCOUNTER — Encounter: Payer: Self-pay | Admitting: Family Medicine

## 2022-04-24 DIAGNOSIS — R8290 Unspecified abnormal findings in urine: Secondary | ICD-10-CM | POA: Insufficient documentation

## 2022-04-24 DIAGNOSIS — Z0001 Encounter for general adult medical examination with abnormal findings: Secondary | ICD-10-CM | POA: Insufficient documentation

## 2022-04-24 DIAGNOSIS — L309 Dermatitis, unspecified: Secondary | ICD-10-CM | POA: Insufficient documentation

## 2022-04-24 DIAGNOSIS — N390 Urinary tract infection, site not specified: Secondary | ICD-10-CM | POA: Diagnosis not present

## 2022-04-24 LAB — POCT URINALYSIS DIP (CLINITEK)
Bilirubin, UA: NEGATIVE
Glucose, UA: NEGATIVE mg/dL
Ketones, POC UA: NEGATIVE mg/dL
Nitrite, UA: NEGATIVE
POC PROTEIN,UA: NEGATIVE
Spec Grav, UA: 1.01 (ref 1.010–1.025)
Urobilinogen, UA: 0.2 E.U./dL
pH, UA: 5.5 (ref 5.0–8.0)

## 2022-04-24 MED ORDER — OMEPRAZOLE 40 MG PO CPDR: 40.0000 mg | DELAYED_RELEASE_CAPSULE | Freq: Every day | ORAL | 1 refills | Status: DC

## 2022-04-24 NOTE — Assessment & Plan Note (Signed)

## 2022-04-24 NOTE — Telephone Encounter (Signed)
Urine sent for culture

## 2022-04-24 NOTE — Assessment & Plan Note (Signed)
CCUA to be submitted next week for testing, unable to void at visit has recurrent UTI history

## 2022-04-24 NOTE — Progress Notes (Signed)
    [name]     MRN: [id number]      DOB: December 15, 1925  HPI: Patient is in for annual physical exam. 1 week h/o malodorous urine, no fever, chills , change in appetite or energy.will bring in specimen next week, unable to void.  Recent labs,  are reviewed. Immunization is reviewed , and  updated if needed.   PE: BP 107/72   Pulse (!) 108   Resp 16   Ht 5\' 6"  (1.676 m)   Wt 164 lb (74.4 kg)   SpO2 97%   BMI 26.47 kg/m   Pleasant  female, alert and oriented x 3, in no cardio-pulmonary distress. Afebrile. HEENT No facial trauma or asymetry. Sinuses non tender.  Extra occullar muscles intact.. External ears normal, . Neck: decreased ROM, no adenopathy,JVD or thyromegaly.No bruits.  Chest: Clear to ascultation bilaterally.No crackles or wheezes. Non tender to palpation    Cardiovascular system; Heart sounds normal,  S1 and  S2 ,no S3.    Abdomen: Soft, non tender, .     Musculoskeletal exam: Markedly decreased  ROM of spine, hips , shoulders and knees.  deformity ,swelling or crepitus noted.  muscle wasting no atrophy.   Neurologic: Cranial nerves 2 to 12 intact.Decreased vision and hearing Power, tone ,sensations normal throughout. disturbance in gait. No tremor.  Skin: Intact, no ulceration, mild erythema , and  rash noted.on anterior upper chest Pigmentation normal throughout  Psych; Normal mood and affect. Judgement and concentration normal   Assessment & Plan:  Encounter for Medicare annual examination with abnormal findings Annual exam as documented. Counseling done  re healthy lifestyle involving commitment to 150 minutes exercise per week, heart healthy diet, and attaining healthy weight.The importance of adequate sleep also discussed. Regular seat belt use and home safety, is also discussed. Changes in health habits are decided on by the patient with goals and time frames  set for achieving them. Immunization and cancer screening needs are  specifically addressed at this visit.   Malodorous urine CCUA to be submitted next week for testing, unable to void at visit has recurrent UTI history  Dermatitis Clobetasol prescribed sparing use limited time

## 2022-04-24 NOTE — Telephone Encounter (Signed)
Urine sample in lab & pt daughter asked if you could please call her for the lab results

## 2022-04-24 NOTE — Assessment & Plan Note (Signed)
Clobetasol prescribed sparing use limited time

## 2022-04-25 ENCOUNTER — Encounter: Payer: Self-pay | Admitting: Family Medicine

## 2022-04-25 LAB — IRON: Iron: 63 ug/dL (ref 27–139)

## 2022-04-25 LAB — FERRITIN: Ferritin: 24 ng/mL (ref 15–150)

## 2022-04-25 LAB — SPECIMEN STATUS REPORT

## 2022-04-25 LAB — VITAMIN B12: Vitamin B-12: 1117 pg/mL (ref 232–1245)

## 2022-04-27 ENCOUNTER — Ambulatory Visit (INDEPENDENT_AMBULATORY_CARE_PROVIDER_SITE_OTHER): Payer: Medicare Other

## 2022-04-27 ENCOUNTER — Encounter: Payer: Self-pay | Admitting: Family Medicine

## 2022-04-27 DIAGNOSIS — I495 Sick sinus syndrome: Secondary | ICD-10-CM | POA: Diagnosis not present

## 2022-04-27 LAB — CUP PACEART REMOTE DEVICE CHECK
Battery Remaining Longevity: 64 mo
Battery Remaining Percentage: 57 %
Battery Voltage: 2.99 V
Brady Statistic RV Percent Paced: 67 %
Date Time Interrogation Session: 20230831020014
Implantable Lead Implant Date: 20110422
Implantable Lead Implant Date: 20110422
Implantable Lead Location: 753859
Implantable Lead Location: 753860
Implantable Pulse Generator Implant Date: 20180319
Lead Channel Impedance Value: 400 Ohm
Lead Channel Pacing Threshold Amplitude: 1 V
Lead Channel Pacing Threshold Pulse Width: 0.5 ms
Lead Channel Sensing Intrinsic Amplitude: 10 mV
Lead Channel Setting Pacing Amplitude: 2 V
Lead Channel Setting Pacing Pulse Width: 0.5 ms
Lead Channel Setting Sensing Sensitivity: 1 mV
Pulse Gen Model: 2272
Pulse Gen Serial Number: 8010902

## 2022-04-28 ENCOUNTER — Other Ambulatory Visit: Payer: Self-pay | Admitting: Family Medicine

## 2022-04-28 LAB — URINE CULTURE

## 2022-04-28 MED ORDER — NITROFURANTOIN MONOHYD MACRO 100 MG PO CAPS: 100.0000 mg | ORAL_CAPSULE | Freq: Two times a day (BID) | ORAL | 0 refills | Status: DC

## 2022-04-28 NOTE — Telephone Encounter (Signed)
Patient daughter aware. °

## 2022-04-28 NOTE — Progress Notes (Signed)
Rx nitrofurantoin

## 2022-05-03 ENCOUNTER — Other Ambulatory Visit: Payer: Self-pay | Admitting: Cardiology

## 2022-05-03 DIAGNOSIS — R0902 Hypoxemia: Secondary | ICD-10-CM | POA: Diagnosis not present

## 2022-05-03 NOTE — Telephone Encounter (Signed)
Refill request for warfarin:  Last INR was 3.0 on 04/04/22 Next INR due on 05/16/22 LOV was 01/10/22  Myles Gip MD  Refill approved.

## 2022-05-10 ENCOUNTER — Ambulatory Visit (INDEPENDENT_AMBULATORY_CARE_PROVIDER_SITE_OTHER): Payer: Medicare Other

## 2022-05-10 DIAGNOSIS — Z23 Encounter for immunization: Secondary | ICD-10-CM

## 2022-05-16 ENCOUNTER — Telehealth: Payer: Self-pay | Admitting: Family Medicine

## 2022-05-16 ENCOUNTER — Telehealth: Payer: Self-pay

## 2022-05-16 NOTE — Telephone Encounter (Signed)
error 

## 2022-05-16 NOTE — Telephone Encounter (Signed)
Daughter called and states that [name] tripped and fell and hit the back of her head on a childs chair. Wanted to know what to do. I advised her to go be evaluated at the ER. States [name] did not want to go to the ER and states she only hit her shoulder and not her head. I advised with the coumadin it was best to be evaluated at the ER and I could only recommend to her what she should do but she should encourage her mother to go since she did not want to see an available provider in the office.

## 2022-05-18 NOTE — Progress Notes (Signed)
Remote pacemaker transmission.   

## 2022-05-22 ENCOUNTER — Ambulatory Visit: Payer: Medicare Other | Attending: Cardiology | Admitting: *Deleted

## 2022-05-22 DIAGNOSIS — I480 Paroxysmal atrial fibrillation: Secondary | ICD-10-CM

## 2022-05-22 DIAGNOSIS — Z5181 Encounter for therapeutic drug level monitoring: Secondary | ICD-10-CM

## 2022-05-22 LAB — POCT INR: INR: 3 (ref 2.0–3.0)

## 2022-05-22 NOTE — Patient Instructions (Signed)
Description   Continue warfarin 1/2 tablet daily except 1 tablet on Thursdays Recheck in 6 weeks Continue greens

## 2022-05-25 ENCOUNTER — Encounter: Payer: Self-pay | Admitting: Family Medicine

## 2022-05-25 ENCOUNTER — Other Ambulatory Visit (INDEPENDENT_AMBULATORY_CARE_PROVIDER_SITE_OTHER): Payer: Medicare Other

## 2022-05-25 DIAGNOSIS — N390 Urinary tract infection, site not specified: Secondary | ICD-10-CM

## 2022-05-25 LAB — POCT URINALYSIS DIP (CLINITEK)
Bilirubin, UA: NEGATIVE
Glucose, UA: NEGATIVE mg/dL
Ketones, POC UA: NEGATIVE mg/dL
Nitrite, UA: POSITIVE — AB
POC PROTEIN,UA: NEGATIVE
Spec Grav, UA: 1.01 (ref 1.010–1.025)
Urobilinogen, UA: 0.2 E.U./dL
pH, UA: 5.5 (ref 5.0–8.0)

## 2022-05-25 NOTE — Telephone Encounter (Signed)
Patient daughter aware sent for c/s and we will call when results come back.

## 2022-05-26 ENCOUNTER — Encounter: Payer: Self-pay | Admitting: Family Medicine

## 2022-05-26 ENCOUNTER — Other Ambulatory Visit: Payer: Self-pay | Admitting: Family Medicine

## 2022-05-26 NOTE — Telephone Encounter (Signed)
Fyi.

## 2022-05-29 LAB — URINE CULTURE

## 2022-05-29 MED ORDER — AMPICILLIN 500 MG PO CAPS: 500.0000 mg | ORAL_CAPSULE | Freq: Three times a day (TID) | ORAL | 0 refills | Status: DC

## 2022-05-30 ENCOUNTER — Encounter: Payer: Self-pay | Admitting: Family Medicine

## 2022-06-02 ENCOUNTER — Encounter: Payer: Self-pay | Admitting: Family Medicine

## 2022-06-02 DIAGNOSIS — R0902 Hypoxemia: Secondary | ICD-10-CM | POA: Diagnosis not present

## 2022-06-13 ENCOUNTER — Other Ambulatory Visit: Payer: Self-pay | Admitting: Student

## 2022-06-13 ENCOUNTER — Other Ambulatory Visit: Payer: Self-pay | Admitting: Family Medicine

## 2022-07-03 ENCOUNTER — Ambulatory Visit: Payer: Medicare Other | Attending: Cardiology | Admitting: *Deleted

## 2022-07-03 DIAGNOSIS — R0902 Hypoxemia: Secondary | ICD-10-CM | POA: Diagnosis not present

## 2022-07-03 DIAGNOSIS — I480 Paroxysmal atrial fibrillation: Secondary | ICD-10-CM

## 2022-07-03 DIAGNOSIS — Z5181 Encounter for therapeutic drug level monitoring: Secondary | ICD-10-CM | POA: Diagnosis not present

## 2022-07-03 LAB — POCT INR: INR: 3.6 — AB (ref 2.0–3.0)

## 2022-07-03 MED ORDER — DILTIAZEM HCL ER COATED BEADS 300 MG PO CP24: 300.0000 mg | ORAL_CAPSULE | Freq: Every day | ORAL | 3 refills | Status: DC

## 2022-07-03 NOTE — Patient Instructions (Signed)
Hold warfarin tonight then decrease dose to 1/2 tablet daily. Recheck in 4 weeks Continue greens

## 2022-07-04 ENCOUNTER — Other Ambulatory Visit: Payer: Self-pay | Admitting: Family Medicine

## 2022-07-11 ENCOUNTER — Encounter: Payer: Self-pay | Admitting: Internal Medicine

## 2022-07-11 ENCOUNTER — Ambulatory Visit: Payer: Medicare Other | Attending: Internal Medicine | Admitting: Internal Medicine

## 2022-07-11 VITALS — BP 118/70 | HR 119 | Wt 162.2 lb

## 2022-07-11 DIAGNOSIS — I480 Paroxysmal atrial fibrillation: Secondary | ICD-10-CM

## 2022-07-11 LAB — CUP PACEART INCLINIC DEVICE CHECK
Battery Remaining Longevity: 62 mo
Battery Voltage: 2.99 V
Brady Statistic RA Percent Paced: 0 %
Brady Statistic RV Percent Paced: 67 %
Date Time Interrogation Session: 20231114132425
Implantable Lead Connection Status: 753985
Implantable Lead Connection Status: 753985
Implantable Lead Implant Date: 20110422
Implantable Lead Implant Date: 20110422
Implantable Lead Location: 753859
Implantable Lead Location: 753860
Implantable Pulse Generator Implant Date: 20180319
Lead Channel Impedance Value: 400 Ohm
Lead Channel Pacing Threshold Amplitude: 1 V
Lead Channel Pacing Threshold Amplitude: 1 V
Lead Channel Pacing Threshold Pulse Width: 0.5 ms
Lead Channel Pacing Threshold Pulse Width: 0.5 ms
Lead Channel Sensing Intrinsic Amplitude: 6.9 mV
Lead Channel Setting Pacing Amplitude: 2 V
Lead Channel Setting Pacing Pulse Width: 0.5 ms
Lead Channel Setting Sensing Sensitivity: 1 mV
Pulse Gen Model: 2272
Pulse Gen Serial Number: 8010902

## 2022-07-11 NOTE — Patient Instructions (Signed)
Medication Instructions:  Your physician recommends that you continue on your current medications as directed. Please refer to the Current Medication list given to you today.  *If you need a refill on your cardiac medications before your next appointment, please call your pharmacy*   Lab Work: NONE   If you have labs (blood work) drawn today and your tests are completely normal, you will receive your results only by: MyChart Message (if you have MyChart) OR A paper copy in the mail If you have any lab test that is abnormal or we need to change your treatment, we will call you to review the results.   Testing/Procedures: NONE    Follow-Up: At [facility], you and your health needs are our priority.  As part of our continuing mission to provide you with exceptional heart care, we have created designated Provider Care Teams.  These Care Teams include your primary Cardiologist (physician) and Advanced Practice Providers (APPs -  Physician Assistants and Nurse Practitioners) who all work together to provide you with the care you need, when you need it.  We recommend signing up for the patient portal called "MyChart".  Sign up information is provided on this After Visit Summary.  MyChart is used to connect with patients for Virtual Visits (Telemedicine).  Patients are able to view lab/test results, encounter notes, upcoming appointments, etc.  Non-urgent messages can be sent to your provider as well.   To learn more about what you can do with MyChart, go to [email or web address].    Your next appointment:   1 year(s)  The format for your next appointment:   In Person  Provider:   Gregg Taylor, MD    Other Instructions Thank you for choosing [facility]!    Important Information About Sugar       

## 2022-07-11 NOTE — Progress Notes (Signed)
HPI [name] returns today for followup. She is a pleasant 88 yo woman with chronic atrial fib, diastolic heart failure, s/p brady, s/p PPM insertion. She has not fallen. She denies syncope or loss of consciousness. She denies chest pain or sob. She admits to being sedentary. She has not been out of her house. She is worried about Covid. No Known Allergies   Current Outpatient Medications  Medication Sig Dispense Refill   Ascorbic Acid (VITAMIN C PO) Take 1 tablet by mouth daily.     busPIRone (BUSPAR) 7.5 MG tablet TAKE (1) TABLET BY MOUTH TWICE DAILY. 60 tablet 0   Cholecalciferol (VITAMIN D3 PO) Take 2 tablets by mouth daily.     clobetasol cream (TEMOVATE) 1.95 % Apply 1 Application topically 2 (two) times daily. Apply sparingly twice daily to rash on neck for 1 week, then as needed 45 g 0   Cyanocobalamin (VITAMIN B12 PO) Take 1 tablet by mouth daily.     diltiazem (CARDIZEM CD) 300 MG 24 hr capsule Take 1 capsule (300 mg total) by mouth daily. 90 capsule 3   donepezil (ARICEPT) 10 MG tablet TAKE (1) TABLET BY MOUTH AT BEDTIME. 90 tablet 0   fluticasone (FLONASE) 50 MCG/ACT nasal spray Place 1 spray into both nostrils daily.     levothyroxine (SYNTHROID) 112 MCG tablet TAKE 1 TABLET BY MOUTH ONCE DAILY. 90 tablet 0   loratadine (CLARITIN) 10 MG tablet Take 1 tablet (10 mg total) by mouth daily. 30 tablet 1   lovastatin (MEVACOR) 40 MG tablet TAKE ONE TABLET BY MOUTH AT BEDTIME. 90 tablet 4   meclizine (ANTIVERT) 12.5 MG tablet Take 1 tablet (12.5 mg total) by mouth 2 (two) times daily as needed for dizziness. 30 tablet 0   metoprolol tartrate (LOPRESSOR) 100 MG tablet TAKE 1 TABLET BY MOUTH TWICE A DAY. 180 tablet 3   metoprolol tartrate (LOPRESSOR) 25 MG tablet TAKE (1) TABLET BY MOUTH TWICE DAILY. (TAKE WITH 100MG ) 180 tablet 3   Multiple Vitamins-Minerals (EYE VITAMINS & MINERALS PO) Take 1 tablet by mouth daily.     omeprazole (PRILOSEC) 40 MG capsule TAKE (1) CAPSULE BY  MOUTH ONCE DAILY FOR ACID REFLUX. 90 capsule 0   omeprazole (PRILOSEC) 40 MG capsule Take 1 capsule (40 mg total) by mouth daily. 90 capsule 1   OXYGEN-HELIUM IN Inhale 2 L into the lungs at bedtime.     torsemide (DEMADEX) 20 MG tablet TAKE (2) TABLETS BY MOUTH EVERY OTHER DAY ALTERNATING WITH (1) TABLET. 270 tablet 0   warfarin (COUMADIN) 5 MG tablet TAKE 1/2 TABLET BY MOUTH DAILY EXCEPT 1 TABLET ON THURSDAYS OR AS DIRECTED 25 tablet 5   zinc gluconate 50 MG tablet Take 50 mg by mouth daily.     ampicillin (PRINCIPEN) 500 MG capsule Take 1 capsule (500 mg total) by mouth 3 (three) times daily. (Patient not taking: Reported on 07/11/2022) 21 capsule 0   nitrofurantoin, macrocrystal-monohydrate, (MACROBID) 100 MG capsule Take 1 capsule (100 mg total) by mouth 2 (two) times daily. 14 capsule 0   Current Facility-Administered Medications  Medication Dose Route Frequency Provider Last Rate Last Admin   miconazole (MONISTAT 7) 2 % vaginal cream 1 Applicatorful  1 Applicatorful Vaginal QHS Jonnie Kind, MD         Past Medical History:  Diagnosis Date   Allergy    Annual physical exam 04/22/2015   Anxiety    Arthritis    Arthritis  Asthma    Atrial fibrillation (HCC)    Bleeding disorder (HCC)    CHF (congestive heart failure) (HCC)    CKD (chronic kidney disease) stage 4, GFR 15-29 ml/min ([place]) 09/08/2021   COPD (chronic obstructive pulmonary disease) (HCC)    Depression    Gastroesophageal reflux disease    Hearing impairment    Heart disease    Heart murmur    High cholesterol    Hyperlipidemia    Hypertension    Hypothyroidism    Impaired glucose tolerance    Macular degeneration    Oxygen deficiency    Pancreatitis    Paroxysmal atrial fibrillation ([place]) 2011   Sick sinus syndrome ([place]) 2011   Atrial fibrilation 10/2009; and dual chamber pacemaker in 4/11; normal EF   Sleep apnea    Nocturnal oxygen therapy   Zoster 2016    ROS:   All systems reviewed and  negative except as noted in the HPI.   Past Surgical History:  Procedure Laterality Date   ABDOMINAL HYSTERECTOMY     CATARACT EXTRACTION     Bilateral   COLONOSCOPY  08/29/2007   Dr. Gala Romney   EYE SURGERY     laser    PACEMAKER INSERTION  08/28/2009   dual chamber    PPM GENERATOR CHANGEOUT N/A 11/13/2016   Procedure: PPM Generator Changeout;  Surgeon: Evans Lance, MD;  Location: [facility];  Service: Cardiovascular;  Laterality: N/A;   SALPINGOOPHORECTOMY  08/28/1968   right     Family History  Problem Relation Age of Onset   Cancer Mother        gential   Cancer Father        throat    Pneumonia Sister    Emphysema Sister    Mitral valve prolapse Sister    Heart disease Other    Arthritis Other    Lung disease Other    Asthma Other    Melanoma Son    Anxiety disorder Daughter    Arthritis Daughter    Asthma Daughter    COPD Daughter    Depression Daughter    Kidney disease Daughter    Obesity Daughter    Anxiety disorder Daughter    Anxiety disorder Daughter    Arthritis Daughter      Social History   Socioeconomic History   Marital status: Widowed    Spouse name: Not on file   Number of children: 7   Years of education: college   Highest education level: Not on file  Occupational History   Occupation: retired     Fish farm manager: RETIRED  Tobacco Use   Smoking status: Never    Passive exposure: Never   Smokeless tobacco: Never  Vaping Use   Vaping Use: Never used  Substance and Sexual Activity   Alcohol use: No   Drug use: No   Sexual activity: Not Currently    Birth control/protection: Surgical    Comment: hyst  Other Topics Concern   Not on file  Social History Narrative   Not on file   Social Determinants of Health   Financial Resource Strain: Low Risk  (02/09/2020)   Overall Financial Resource Strain (CARDIA)    Difficulty of Paying Living Expenses: Not hard at all  Food Insecurity: No Food Insecurity (02/09/2020)   Hunger  Vital Sign    Worried About Running Out of Food in the Last Year: Never true    [place] in the Last Year:  Never true  Transportation Needs: No Transportation Needs (02/09/2020)   PRAPARE - Hydrologist (Medical): No    Lack of Transportation (Non-Medical): No  Physical Activity: Inactive (02/09/2020)   Exercise Vital Sign    Days of Exercise per Week: 0 days    Minutes of Exercise per Session: 0 min  Stress: Stress Concern Present (02/09/2020)   [place]    Feeling of Stress : To some extent  Social Connections: Socially Isolated (02/09/2020)   Social Connection and Isolation Panel [NHANES]    Frequency of Communication with Friends and Family: Once a week    Frequency of Social Gatherings with Friends and Family: More than three times a week    Attends Religious Services: Never    Marine scientist or Organizations: No    Attends Archivist Meetings: Never    Marital Status: Widowed  Intimate Partner Violence: Not At Risk (07/06/2020)   Humiliation, Afraid, Rape, and Kick questionnaire    Fear of Current or Ex-Partner: No    Emotionally Abused: No    Physically Abused: No    Sexually Abused: No     BP 118/70 (BP Location: Left Arm, Patient Position: Sitting, Cuff Size: Normal)   Pulse (!) 119   Wt 162 lb 3.2 oz (73.6 kg)   SpO2 95%   BMI 26.18 kg/m   Physical Exam:  Well appearing NAD HEENT: Unremarkable Neck:  No JVD, no thyromegally Lymphatics:  No adenopathy Back:  No CVA tenderness Lungs:  Clear HEART:  Regular rate rhythm, no murmurs, no rubs, no clicks Abd:  soft, positive bowel sounds, no organomegally, no rebound, no guarding Ext:  2 plus pulses, no edema, no cyanosis, no clubbing Skin:  No rashes no nodules Neuro:  CN II through XII intact, motor grossly intact  EKG - atrial fib with a CVR and intermittent ventricular pacing  DEVICE  Normal  device function.  See PaceArt for details.   Assess/Plan: 1. Persistent atrial fib - her VR is mostly controlled. She is pacing 67%. 2. PPM - her St[employer] Jude PPM is working normally with 10 years of battery longevity. 3. HTN - her bp is much improved. 4. Diastolic heart failure - her diuretic was changed from lasix to torsemide. She takes an extra when her weight goes up.   Hayley Overlie Rudie Rikard,MD

## 2022-07-18 ENCOUNTER — Encounter: Payer: Self-pay | Admitting: Cardiology

## 2022-07-18 ENCOUNTER — Ambulatory Visit: Payer: Medicare Other | Attending: Cardiology | Admitting: Cardiology

## 2022-07-18 VITALS — BP 118/64 | HR 72 | Ht 65.0 in | Wt 164.8 lb

## 2022-07-18 DIAGNOSIS — I10 Essential (primary) hypertension: Secondary | ICD-10-CM

## 2022-07-18 DIAGNOSIS — D6869 Other thrombophilia: Secondary | ICD-10-CM | POA: Diagnosis not present

## 2022-07-18 DIAGNOSIS — E782 Mixed hyperlipidemia: Secondary | ICD-10-CM

## 2022-07-18 DIAGNOSIS — I4891 Unspecified atrial fibrillation: Secondary | ICD-10-CM

## 2022-07-18 DIAGNOSIS — R600 Localized edema: Secondary | ICD-10-CM | POA: Diagnosis not present

## 2022-07-18 NOTE — Progress Notes (Signed)
Clinical Summary [name] is a 88 y.o.female seen today for follow up of the following medical problems     1. Paroxysmal afib - Has not been interested in NOACs. Has been on coumadin     - no recent palpitatoins - compliant with meds - no bleeding on coumadin.        2. Symptomatic bradycardia with pacemaker - followed by EP - normal pacemaker function by 06/2022 check   3. HTN - compliant with meds - she is compliant with meds       4. Hyperlipidemia  - she is on lovstatin   02/2021 TC 143 TG 134 HDL 60 LDL 60 Jan 2023 TC 144 TG 147 HDL 59 LDL 60 03/2022 TC 138 TG 123 HDL 56 LDL 60     5. Edema - 06/24/20 labs Cr 1.2 BUN 26 BNP 2779  - 06/2020 echo LVEF 55-60%, indet DDx  - swelling is up and down, though primarily controled - taking toresmide 40mg  alteranting days with 20mg  - home weights 159-161 lbs which is stable over time   Sep 07 2021 lowered torsemide from 40mg  alternating days with 20mg  to 20mg  daily. Cr had elevated to 1.91, with change down to 1.49 which was prior baseline.      - swelling is up and down. Taking torsemide 20mg  daily, will taked 20mg  prn.  - home weights 162-165 lbs which has been her long time baseline    SH: daughter of [name] who is also a patient of mine. She has a great grandsone here with her today started kindergarten, a great grandaughter who is 13 yos     Past Medical History:  Diagnosis Date   Allergy    Annual physical exam 04/22/2015   Anxiety    Arthritis    Arthritis    Asthma    Atrial fibrillation (HCC)    Bleeding disorder ([place])    CHF (congestive heart failure) (HCC)    CKD (chronic kidney disease) stage 4, GFR 15-29 ml/min ([place]) 09/08/2021   COPD (chronic obstructive pulmonary disease) (HCC)    Depression    Gastroesophageal reflux disease    Hearing impairment    Heart disease    Heart murmur    High cholesterol    Hyperlipidemia    Hypertension    Hypothyroidism    Impaired glucose  tolerance    Macular degeneration    Oxygen deficiency    Pancreatitis    Paroxysmal atrial fibrillation ([place]) 2011   Sick sinus syndrome ([place]) 2011   Atrial fibrilation 10/2009; and dual chamber pacemaker in 4/11; normal EF   Sleep apnea    Nocturnal oxygen therapy   Zoster 2016     No Known Allergies   Current Outpatient Medications  Medication Sig Dispense Refill   ampicillin (PRINCIPEN) 500 MG capsule Take 1 capsule (500 mg total) by mouth 3 (three) times daily. (Patient not taking: Reported on 07/11/2022) 21 capsule 0   Ascorbic Acid (VITAMIN C PO) Take 1 tablet by mouth daily.     busPIRone (BUSPAR) 7.5 MG tablet TAKE (1) TABLET BY MOUTH TWICE DAILY. 60 tablet 0   Cholecalciferol (VITAMIN D3 PO) Take 2 tablets by mouth daily.     clobetasol cream (TEMOVATE) 1.61 % Apply 1 Application topically 2 (two) times daily. Apply sparingly twice daily to rash on neck for 1 week, then as needed 45 g 0   Cyanocobalamin (VITAMIN B12 PO) Take 1 tablet by mouth  daily.     diltiazem (CARDIZEM CD) 300 MG 24 hr capsule Take 1 capsule (300 mg total) by mouth daily. 90 capsule 3   donepezil (ARICEPT) 10 MG tablet TAKE (1) TABLET BY MOUTH AT BEDTIME. 90 tablet 0   fluticasone (FLONASE) 50 MCG/ACT nasal spray Place 1 spray into both nostrils daily.     levothyroxine (SYNTHROID) 112 MCG tablet TAKE 1 TABLET BY MOUTH ONCE DAILY. 90 tablet 0   loratadine (CLARITIN) 10 MG tablet Take 1 tablet (10 mg total) by mouth daily. 30 tablet 1   lovastatin (MEVACOR) 40 MG tablet TAKE ONE TABLET BY MOUTH AT BEDTIME. 90 tablet 4   meclizine (ANTIVERT) 12.5 MG tablet Take 1 tablet (12.5 mg total) by mouth 2 (two) times daily as needed for dizziness. 30 tablet 0   metoprolol tartrate (LOPRESSOR) 100 MG tablet TAKE 1 TABLET BY MOUTH TWICE A DAY. 180 tablet 3   metoprolol tartrate (LOPRESSOR) 25 MG tablet TAKE (1) TABLET BY MOUTH TWICE DAILY. (TAKE WITH 100MG ) 180 tablet 3   Multiple Vitamins-Minerals (EYE VITAMINS &  MINERALS PO) Take 1 tablet by mouth daily.     nitrofurantoin, macrocrystal-monohydrate, (MACROBID) 100 MG capsule Take 1 capsule (100 mg total) by mouth 2 (two) times daily. 14 capsule 0   omeprazole (PRILOSEC) 40 MG capsule TAKE (1) CAPSULE BY MOUTH ONCE DAILY FOR ACID REFLUX. 90 capsule 0   omeprazole (PRILOSEC) 40 MG capsule Take 1 capsule (40 mg total) by mouth daily. 90 capsule 1   OXYGEN-HELIUM IN Inhale 2 L into the lungs at bedtime.     torsemide (DEMADEX) 20 MG tablet TAKE (2) TABLETS BY MOUTH EVERY OTHER DAY ALTERNATING WITH (1) TABLET. 270 tablet 0   warfarin (COUMADIN) 5 MG tablet TAKE 1/2 TABLET BY MOUTH DAILY EXCEPT 1 TABLET ON THURSDAYS OR AS DIRECTED 25 tablet 5   zinc gluconate 50 MG tablet Take 50 mg by mouth daily.     Current Facility-Administered Medications  Medication Dose Route Frequency Provider Last Rate Last Admin   miconazole (MONISTAT 7) 2 % vaginal cream 1 Applicatorful  1 Applicatorful Vaginal QHS Jonnie Kind, MD         Past Surgical History:  Procedure Laterality Date   ABDOMINAL HYSTERECTOMY     CATARACT EXTRACTION     Bilateral   COLONOSCOPY  08/29/2007   Dr. Gala Romney   EYE SURGERY     laser    PACEMAKER INSERTION  08/28/2009   dual chamber    PPM GENERATOR CHANGEOUT N/A 11/13/2016   Procedure: PPM Generator Changeout;  Surgeon: Evans Lance, MD;  Location: [facility];  Service: Cardiovascular;  Laterality: N/A;   SALPINGOOPHORECTOMY  08/28/1968   right     No Known Allergies    Family History  Problem Relation Age of Onset   Cancer Mother        gential   Cancer Father        throat    Pneumonia Sister    Emphysema Sister    Mitral valve prolapse Sister    Heart disease Other    Arthritis Other    Lung disease Other    Asthma Other    Melanoma Son    Anxiety disorder Daughter    Arthritis Daughter    Asthma Daughter    COPD Daughter    Depression Daughter    Kidney disease Daughter    Obesity Daughter     Anxiety disorder Daughter  Anxiety disorder Daughter    Arthritis Daughter      Social History [name] reports that she has never smoked. She has never been exposed to tobacco smoke. She has never used smokeless tobacco. [name] reports no history of alcohol use.   Review of Systems CONSTITUTIONAL: No weight loss, fever, chills, weakness or fatigue.  HEENT: Eyes: No visual loss, blurred vision, double vision or yellow sclerae.No hearing loss, sneezing, congestion, runny nose or sore throat.  SKIN: No rash or itching.  CARDIOVASCULAR: per hpi RESPIRATORY: No shortness of breath, cough or sputum.  GASTROINTESTINAL: No anorexia, nausea, vomiting or diarrhea. No abdominal pain or blood.  GENITOURINARY: No burning on urination, no polyuria NEUROLOGICAL: No headache, dizziness, syncope, paralysis, ataxia, numbness or tingling in the extremities. No change in bowel or bladder control.  MUSCULOSKELETAL: No muscle, back pain, joint pain or stiffness.  LYMPHATICS: No enlarged nodes. No history of splenectomy.  PSYCHIATRIC: No history of depression or anxiety.  ENDOCRINOLOGIC: No reports of sweating, cold or heat intolerance. No polyuria or polydipsia.  Marland Kitchen   Physical Examination Today's Vitals   07/18/22 1113  BP: 118/64  Pulse: 72  SpO2: 97%  Weight: 164 lb 12.8 oz (74.8 kg)  Height: 5\' 5"  (1.651 m)   Body mass index is 27.42 kg/m.  Gen: resting comfortably, no acute distress HEENT: no scleral icterus, pupils equal round and reactive, no palptable cervical adenopathy,  CV: RRR, no m/r/g, no jvd Resp: Clear to auscultation bilaterally GI: abdomen is soft, non-tender, non-distended, normal bowel sounds, no hepatosplenomegaly MSK: extremities are warm, no edema.  Skin: warm, no rash Neuro:  no focal deficits Psych: appropriate affect   Diagnostic Studies  07/04/19 echo IMPRESSIONS      1. Left ventricular ejection fraction, by visual estimation, is 65 to 70%. The left  ventricle has normal function. There is mildly increased left ventricular hypertrophy.  2. Left ventricular diastolic parameters are indeterminate in the setting of atrial fibrillation.  3. Global right ventricle has normal systolic function.The right ventricular size is mildly enlarged. No increase in right ventricular wall thickness.  4. Left atrial size was upper normal.  5. Right atrial size was upper normal.  6. Mild mitral annular calcification.  7. Moderate aortic valve annular calcification.  8. The mitral valve is grossly normal. Mild mitral valve regurgitation.  9. The tricuspid valve is grossly normal. Tricuspid valve regurgitation is trivial. 10. The aortic valve is tricuspid. Aortic valve regurgitation is not visualized. Mild aortic valve sclerosis without stenosis. 11. The pulmonic valve was grossly normal. Pulmonic valve regurgitation is trivial. 12. A pacer wire is visualized in the RV and RA. 13. The inferior vena cava is normal in size with greater than 50% respiratory variability, suggesting right atrial pressure of 3 mmHg. 14. Mildly elevated pulmonary artery systolic pressure. 15. The tricuspid regurgitant velocity is 2.71 m/s, and with an assumed right atrial pressure of 3 mmHg, the estimated right ventricular systolic pressure is mildly elevated at 32.4 mmHg.   Assessment and Plan   1. Afib/acquired thrombophilia - not intersted in NOACs, continue coumadin - no symptoms, continue current meds   2. LE edema -doing well on torsemide 20mg  daily, will take additional 20mg  prn. AKI when taking 40mg  alternating days with 20mg  that has resolved on lower dose - continue current regimen   3. Hyperlipidemia - at goal, continue lovastatin   4 HTN -she is at goal, continue current meds  F/u 6 months  Arnoldo Lenis, M.D.

## 2022-07-18 NOTE — Patient Instructions (Signed)
Medication Instructions:  Your physician recommends that you continue on your current medications as directed. Please refer to the Current Medication list given to you today.   Labwork: None  Testing/Procedures: None  Follow-Up: Follow up with Dr. Branch in 6 months.   Any Other Special Instructions Will Be Listed Below (If Applicable).     If you need a refill on your cardiac medications before your next appointment, please call your pharmacy.  

## 2022-07-23 ENCOUNTER — Other Ambulatory Visit: Payer: Self-pay | Admitting: Family Medicine

## 2022-07-24 ENCOUNTER — Other Ambulatory Visit: Payer: Self-pay | Admitting: Family Medicine

## 2022-07-24 MED ORDER — BUSPIRONE HCL 7.5 MG PO TABS: ORAL_TABLET | ORAL | 3 refills | Status: DC

## 2022-07-27 ENCOUNTER — Ambulatory Visit (INDEPENDENT_AMBULATORY_CARE_PROVIDER_SITE_OTHER): Payer: Medicare Other

## 2022-07-27 DIAGNOSIS — I495 Sick sinus syndrome: Secondary | ICD-10-CM

## 2022-07-27 LAB — CUP PACEART REMOTE DEVICE CHECK
Battery Remaining Longevity: 60 mo
Battery Remaining Percentage: 54 %
Battery Voltage: 2.99 V
Brady Statistic RV Percent Paced: 78 %
Date Time Interrogation Session: 20231130020017
Implantable Lead Connection Status: 753985
Implantable Lead Connection Status: 753985
Implantable Lead Implant Date: 20110422
Implantable Lead Implant Date: 20110422
Implantable Lead Location: 753859
Implantable Lead Location: 753860
Implantable Pulse Generator Implant Date: 20180319
Lead Channel Impedance Value: 380 Ohm
Lead Channel Pacing Threshold Amplitude: 1 V
Lead Channel Pacing Threshold Pulse Width: 0.5 ms
Lead Channel Sensing Intrinsic Amplitude: 5.9 mV
Lead Channel Setting Pacing Amplitude: 2 V
Lead Channel Setting Pacing Pulse Width: 0.5 ms
Lead Channel Setting Sensing Sensitivity: 1 mV
Pulse Gen Model: 2272
Pulse Gen Serial Number: 8010902

## 2022-07-31 ENCOUNTER — Ambulatory Visit: Payer: Medicare Other | Attending: Cardiology | Admitting: *Deleted

## 2022-07-31 DIAGNOSIS — I480 Paroxysmal atrial fibrillation: Secondary | ICD-10-CM | POA: Diagnosis not present

## 2022-07-31 DIAGNOSIS — Z5181 Encounter for therapeutic drug level monitoring: Secondary | ICD-10-CM | POA: Diagnosis not present

## 2022-07-31 LAB — POCT INR: INR: 3 (ref 2.0–3.0)

## 2022-07-31 NOTE — Patient Instructions (Signed)
Continue warfarin 1/2 tablet daily. Recheck in 5 weeks Continue greens

## 2022-08-02 DIAGNOSIS — R0902 Hypoxemia: Secondary | ICD-10-CM | POA: Diagnosis not present

## 2022-08-14 ENCOUNTER — Ambulatory Visit (INDEPENDENT_AMBULATORY_CARE_PROVIDER_SITE_OTHER): Payer: Medicare Other | Admitting: Internal Medicine

## 2022-08-14 ENCOUNTER — Encounter: Payer: Self-pay | Admitting: Internal Medicine

## 2022-08-14 ENCOUNTER — Other Ambulatory Visit: Payer: Self-pay | Admitting: Family Medicine

## 2022-08-14 VITALS — BP 111/73 | HR 73 | Wt 164.8 lb

## 2022-08-14 DIAGNOSIS — Z0000 Encounter for general adult medical examination without abnormal findings: Secondary | ICD-10-CM

## 2022-08-14 NOTE — Patient Instructions (Signed)
  [name] , Thank you for taking time to come for your Medicare Wellness Visit. I appreciate your ongoing commitment to your health goals. Please review the following plan we discussed and let me know if I can assist you in the future.   These are the goals we discussed:  No specific goals.   This is a list of the screening recommended for you and due dates:  Health Maintenance  Topic Date Due   Medicare Annual Wellness Visit  08/15/2023   DTaP/Tdap/Td vaccine (2 - Td or Tdap) 04/21/2025   Pneumonia Vaccine  Completed   Flu Shot  Completed   DEXA scan (bone density measurement)  Completed   HPV Vaccine  Aged Out   COVID-19 Vaccine  Discontinued   Zoster (Shingles) Vaccine  Discontinued

## 2022-08-14 NOTE — Progress Notes (Signed)
Subjective:  This is a telephone encounter between [facility] and [name] on 08/14/2022 for AWV. The visit was conducted with the patient located at home and [name] at [facility]. The patient's identity was confirmed using their DOB and current address. The patient has consented to being evaluated through a telephone encounter and understands the associated risks (an examination cannot be done and the patient may need to come in for an appointment) / benefits (allows the patient to remain at home, decreasing exposure to coronavirus).      [name] is a 88 y.o. female who presents for Medicare Annual (Subsequent) preventive examination.  Review of Systems    Review of Systems  All other systems reviewed and are negative.    Objective:    Today's Vitals   08/14/22 1052  BP: 111/73  Pulse: 73  SpO2: 97%  Weight: 164 lb 12.8 oz (74.8 kg)   Body mass index is 27.42 kg/m.     07/27/2021   11:59 AM 07/06/2020    1:37 PM 07/08/2019    3:11 PM 07/04/2019    7:26 AM 07/03/2019    9:03 PM 11/13/2016    6:50 AM 10/18/2016    9:39 AM  Advanced Directives  Does Patient Have a Medical Advance Directive? No No No No No No No  Would patient like information on creating a medical advance directive? Yes (ED - Information included in AVS) Yes (ED - Information included in AVS) No - Patient declined No - Patient declined No - Patient declined No - Patient declined     Current Medications (verified) Outpatient Encounter Medications as of 08/14/2022  Medication Sig   ampicillin (PRINCIPEN) 500 MG capsule Take 1 capsule (500 mg total) by mouth 3 (three) times daily. (Patient not taking: Reported on 07/11/2022)   Ascorbic Acid (VITAMIN C PO) Take 1 tablet by mouth daily.   busPIRone (BUSPAR) 7.5 MG tablet TAKE (1) TABLET BY MOUTH TWICE DAILY. Strength: 7.5 mg   Cholecalciferol (VITAMIN D3 PO) Take 2 tablets by mouth daily.   clobetasol cream (TEMOVATE) 4.03 % Apply 1 Application  topically 2 (two) times daily. Apply sparingly twice daily to rash on neck for 1 week, then as needed   Cyanocobalamin (VITAMIN B12 PO) Take 1 tablet by mouth daily.   diltiazem (CARDIZEM CD) 300 MG 24 hr capsule Take 1 capsule (300 mg total) by mouth daily.   donepezil (ARICEPT) 10 MG tablet TAKE (1) TABLET BY MOUTH AT BEDTIME.   fluticasone (FLONASE) 50 MCG/ACT nasal spray Place 1 spray into both nostrils daily.   levothyroxine (SYNTHROID) 112 MCG tablet TAKE 1 TABLET BY MOUTH ONCE DAILY.   loratadine (CLARITIN) 10 MG tablet Take 1 tablet (10 mg total) by mouth daily.   lovastatin (MEVACOR) 40 MG tablet TAKE ONE TABLET BY MOUTH AT BEDTIME.   meclizine (ANTIVERT) 12.5 MG tablet Take 1 tablet (12.5 mg total) by mouth 2 (two) times daily as needed for dizziness.   metoprolol tartrate (LOPRESSOR) 100 MG tablet TAKE 1 TABLET BY MOUTH TWICE A DAY.   metoprolol tartrate (LOPRESSOR) 25 MG tablet TAKE (1) TABLET BY MOUTH TWICE DAILY. (TAKE WITH 100MG )   Multiple Vitamins-Minerals (EYE VITAMINS & MINERALS PO) Take 1 tablet by mouth daily.   nitrofurantoin, macrocrystal-monohydrate, (MACROBID) 100 MG capsule Take 1 capsule (100 mg total) by mouth 2 (two) times daily.   omeprazole (PRILOSEC) 40 MG capsule TAKE (1) CAPSULE BY MOUTH ONCE DAILY FOR ACID REFLUX.   omeprazole (  PRILOSEC) 40 MG capsule TAKE (1) CAPSULE BY MOUTH ONCE DAILY FOR ACID REFLUX.   OXYGEN-HELIUM IN Inhale 2 L into the lungs at bedtime.   torsemide (DEMADEX) 20 MG tablet TAKE (2) TABLETS BY MOUTH EVERY OTHER DAY ALTERNATING WITH (1) TABLET.   warfarin (COUMADIN) 5 MG tablet TAKE 1/2 TABLET BY MOUTH DAILY EXCEPT 1 TABLET ON THURSDAYS OR AS DIRECTED   zinc gluconate 50 MG tablet Take 50 mg by mouth daily.   [DISCONTINUED] benzonatate (TESSALON PERLES) 100 MG capsule Take 1 capsule (100 mg total) by mouth every 6 (six) hours as needed for cough.   Facility-Administered Encounter Medications as of 08/14/2022  Medication   miconazole  (MONISTAT 7) 2 % vaginal cream 1 Applicatorful    Allergies (verified) Patient has no known allergies.   History: Past Medical History:  Diagnosis Date   Allergy    Annual physical exam 04/22/2015   Anxiety    Arthritis    Arthritis    Asthma    Atrial fibrillation (HCC)    Bleeding disorder (HCC)    CHF (congestive heart failure) (HCC)    CKD (chronic kidney disease) stage 4, GFR 15-29 ml/min ([place]) 09/08/2021   COPD (chronic obstructive pulmonary disease) (HCC)    Depression    Gastroesophageal reflux disease    Hearing impairment    Heart disease    Heart murmur    High cholesterol    Hyperlipidemia    Hypertension    Hypothyroidism    Impaired glucose tolerance    Macular degeneration    Oxygen deficiency    Pancreatitis    Paroxysmal atrial fibrillation ([place]) 2011   Sick sinus syndrome ([place]) 2011   Atrial fibrilation 10/2009; and dual chamber pacemaker in 4/11; normal EF   Sleep apnea    Nocturnal oxygen therapy   Zoster 2016   Past Surgical History:  Procedure Laterality Date   ABDOMINAL HYSTERECTOMY     CATARACT EXTRACTION     Bilateral   COLONOSCOPY  08/29/2007   Dr. Gala Romney   EYE SURGERY     laser    PACEMAKER INSERTION  08/28/2009   dual chamber    PPM GENERATOR CHANGEOUT N/A 11/13/2016   Procedure: PPM Generator Changeout;  Surgeon: Evans Lance, MD;  Location: [place] [facility];  Service: Cardiovascular;  Laterality: N/A;   SALPINGOOPHORECTOMY  08/28/1968   right   Family History  Problem Relation Age of Onset   Cancer Mother        gential   Cancer Father        throat    Pneumonia Sister    Emphysema Sister    Mitral valve prolapse Sister    Heart disease Other    Arthritis Other    Lung disease Other    Asthma Other    Melanoma Son    Anxiety disorder Daughter    Arthritis Daughter    Asthma Daughter    COPD Daughter    Depression Daughter    Kidney disease Daughter    Obesity Daughter    Anxiety disorder Daughter    Anxiety  disorder Daughter    Arthritis Daughter    Social History   Socioeconomic History   Marital status: Widowed    Spouse name: Not on file   Number of children: 7   Years of education: college   Highest education level: Not on file  Occupational History   Occupation: retired     Fish farm manager: RETIRED  Tobacco Use  Smoking status: Never    Passive exposure: Never   Smokeless tobacco: Never  Vaping Use   Vaping Use: Never used  Substance and Sexual Activity   Alcohol use: No   Drug use: No   Sexual activity: Not Currently    Birth control/protection: Surgical    Comment: hyst  Other Topics Concern   Not on file  Social History Narrative   Not on file   Social Determinants of Health   Financial Resource Strain: Low Risk  (02/09/2020)   Overall Financial Resource Strain (CARDIA)    Difficulty of Paying Living Expenses: Not hard at all  Food Insecurity: No Food Insecurity (02/09/2020)   Hunger Vital Sign    Worried About Running Out of Food in the Last Year: Never true    Ran Out of Food in the Last Year: Never true  Transportation Needs: No Transportation Needs (02/09/2020)   PRAPARE - Hydrologist (Medical): No    Lack of Transportation (Non-Medical): No  Physical Activity: Inactive (02/09/2020)   Exercise Vital Sign    Days of Exercise per Week: 0 days    Minutes of Exercise per Session: 0 min  Stress: Stress Concern Present (02/09/2020)   [place]    Feeling of Stress : To some extent  Social Connections: Socially Isolated (02/09/2020)   Social Connection and Isolation Panel [NHANES]    Frequency of Communication with Friends and Family: Once a week    Frequency of Social Gatherings with Friends and Family: More than three times a week    Attends Religious Services: Never    Marine scientist or Organizations: No    Attends Archivist Meetings: Never    Marital  Status: Widowed    Tobacco Counseling Counseling given: Not Answered   Clinical Intake:  Pre-visit preparation completed: Yes  Pain : No/denies pain     Nutritional Status: BMI 25 -29 Overweight  How often do you need to have someone help you when you read instructions, pamphlets, or other written materials from your doctor or pharmacy?: 5 - Always What is the last grade level you completed in school?: Some College  Diabetic?No    Activities of Daily Living    08/14/2022   10:57 AM  In your present state of health, do you have any difficulty performing the following activities:  Hearing? 1  Comment Using hearing aid  Vision? 1  Difficulty concentrating or making decisions? 0  Walking or climbing stairs? 1  Dressing or bathing? 1  Doing errands, shopping? 1    Patient Care Team: Fayrene Helper, MD as PCP - General (Family Medicine) Harl Bowie, Alphonse Guild, MD as PCP - Cardiology (Cardiology) Evans Lance, MD (Cardiology) Gala Romney Cristopher Estimable, MD as Attending Physician (Gastroenterology) Rothbart, Cristopher Estimable, MD (Inactive) as Attending Physician (Cardiology) Hayden Pedro, MD as Attending Physician (Ophthalmology) Sinda Du, MD as Attending Physician (Pulmonary Disease)  Indicate any recent Medical Services you may have received from other than [facility] providers in the past year (date may be approximate).     Assessment:   This is a routine wellness examination for Manvi.  Hearing/Vision screen No results found.  Dietary issues and exercise activities discussed:     Goals Addressed   None    Depression Screen    08/14/2022   10:58 AM 08/09/2021    1:19 PM 04/15/2021    2:57 PM 02/24/2021  1:12 PM 08/03/2020    1:24 PM 07/06/2020    1:39 PM 07/06/2020    1:36 PM  PHQ 2/9 Scores  PHQ - 2 Score 0  2 0 0 0 2  PHQ- 9 Score   3  0 2 4  Exception Documentation  Other- indicate reason in comment box       Not completed  Spoke with pt's daughter          Fall Risk    04/20/2022    3:59 PM 01/06/2022    2:15 PM 09/08/2021    3:07 PM 08/09/2021    1:19 PM 07/27/2021   12:03 PM  [place] in the past year? 0 0 0 0   Number falls in past yr: 0 0 0 0 0  Injury with Fall? 0 0 0 0 0  Risk for fall due to :  Impaired balance/gait;Impaired mobility Impaired mobility      FALL RISK PREVENTION PERTAINING TO THE HOME:  Any stairs in or around the home? Yes  If so, are there any without handrails? Yes  Home free of loose throw rugs in walkways, pet beds, electrical cords, etc? Yes  Adequate lighting in your home to reduce risk of falls? Yes   ASSISTIVE DEVICES UTILIZED TO PREVENT FALLS:  Life alert? No  Use of a cane, walker or w/c? Yes  Grab bars in the bathroom? Yes  Shower chair or bench in shower? Yes  Elevated toilet seat or a handicapped toilet? Yes   Cognitive Function:    11/13/2019   10:39 AM 09/24/2019   11:32 AM  MMSE - Mini Mental State Exam  Orientation to time 5 5  Orientation to Place 5 5  Registration 3 3  Attention/ Calculation 5 5  Recall 3 3  Language- name 2 objects 2 2  Language- repeat 1 1  Language- follow 3 step command 3 3  Language- read & follow direction 1 1  Write a sentence 1 1  Copy design 0 0  Total score 29 29        08/14/2022   10:58 AM 07/27/2021   12:04 PM 07/06/2020    1:41 PM 06/17/2018    2:43 PM 10/18/2016    9:53 AM  6CIT Screen  What Year? 0 points 0 points 0 points 0 points 0 points  What month? 0 points 0 points 0 points 0 points 0 points  What time? 0 points 0 points 0 points 0 points 0 points  Count back from 20 0 points 0 points 0 points 2 points 0 points  Months in reverse 0 points 4 points 0 points 0 points 0 points  Repeat phrase 2 points 0 points 0 points 0 points 0 points  Total Score 2 points 4 points 0 points 2 points 0 points    Immunizations Immunization History  Administered Date(s) Administered   Fluad Quad(high Dose 65+) 05/15/2019, 06/17/2020,  05/19/2021, 05/10/2022   H1N1 07/01/2008   Influenza Split 06/14/2012   Influenza Whole 06/22/2006, 05/27/2008, 06/10/2009, 06/09/2010, 05/10/2011   Influenza,inj,Quad PF,6+ Mos 06/09/2013, 08/12/2014, 07/15/2015, 04/19/2016, 07/04/2017, 06/17/2018   Pneumococcal Conjugate-13 03/18/2014   Pneumococcal Polysaccharide-23 06/09/2008   Tdap 04/22/2015    TDAP status: Up to date  Flu Vaccine status: Up to date  Pneumococcal vaccine status: Up to date  Covid-19 vaccine status: Declined, Education has been provided regarding the importance of this vaccine but patient still declined. Advised may receive this vaccine at local  pharmacy or Health Dept.or vaccine clinic. Aware to provide a copy of the vaccination record if obtained from local pharmacy or Health Dept. Verbalized acceptance and understanding.  Qualifies for Shingles Vaccine? Yes   Zostavax completed No   Shingrix Completed?: No.    Education has been provided regarding the importance of this vaccine. Patient has been advised to call insurance company to determine out of pocket expense if they have not yet received this vaccine. Advised may also receive vaccine at local pharmacy or Health Dept. Verbalized acceptance and understanding.  Screening Tests Health Maintenance  Topic Date Due   Medicare Annual Wellness (AWV)  07/27/2022   DTaP/Tdap/Td (2 - Td or Tdap) 04/21/2025   Pneumonia Vaccine 72+ Years old  Completed   INFLUENZA VACCINE  Completed   DEXA SCAN  Completed   HPV VACCINES  Aged Out   COVID-19 Vaccine  Discontinued   Zoster Vaccines- Shingrix  Discontinued    Health Maintenance  Health Maintenance Due  Topic Date Due   Medicare Annual Wellness (AWV)  07/27/2022    Colorectal cancer screening: No longer required.   Mammogram status: No longer required due to age.  Bone Density status: Completed 09/15/2016. Results reflect: Bone density results: OSTEOPENIA.   Lung Cancer Screening: (Low Dose CT Chest  recommended if Age 59-80 years, 30 pack-year currently smoking OR have quit w/in 15years.) does not qualify.   Additional Screening:  Hepatitis C Screening: does not qualify  Vision Screening: Recommended annual ophthalmology exams for early detection of glaucoma and other disorders of the eye. Is the patient up to date with their annual eye exam?  Yes  Who is the provider or what is the name of the office in which the patient attends annual eye exams? Dr.Mathews, has macular degeneration If pt is not established with a provider, would they like to be referred to a provider to establish care? No .   Dental Screening: Recommended annual dental exams for proper oral hygiene  Community Resource Referral / Chronic Care Management: CRR required this visit?  No   CCM required this visit?  No      Plan:     I have personally reviewed and noted the following in the patient's chart:   Medical and social history Use of alcohol, tobacco or illicit drugs  Current medications and supplements including opioid prescriptions. Patient is not currently taking opioid prescriptions. Functional ability and status Nutritional status Physical activity Advanced directives List of other physicians Hospitalizations, surgeries, and ER visits in previous 12 months Vitals Screenings to include cognitive, depression, and falls Referrals and appointments  In addition, I have reviewed and discussed with patient certain preventive protocols, quality metrics, and best practice recommendations. A written personalized care plan for preventive services as well as general preventive health recommendations were provided to patient.     Hayley Dy, MD   08/14/2022

## 2022-08-16 NOTE — Progress Notes (Signed)
Remote pacemaker transmission.   

## 2022-08-23 ENCOUNTER — Other Ambulatory Visit: Payer: Self-pay | Admitting: Family Medicine

## 2022-09-02 DIAGNOSIS — R0902 Hypoxemia: Secondary | ICD-10-CM | POA: Diagnosis not present

## 2022-09-04 ENCOUNTER — Ambulatory Visit: Payer: Medicare Other | Attending: Cardiology | Admitting: *Deleted

## 2022-09-04 DIAGNOSIS — I480 Paroxysmal atrial fibrillation: Secondary | ICD-10-CM

## 2022-09-04 DIAGNOSIS — Z5181 Encounter for therapeutic drug level monitoring: Secondary | ICD-10-CM | POA: Diagnosis not present

## 2022-09-04 LAB — POCT INR: INR: 1.8 — AB (ref 2.0–3.0)

## 2022-09-04 NOTE — Patient Instructions (Signed)
Take warfarin 1 tablet tonight then resume 1/2 tablet daily. Recheck in 4 weeks Continue greens

## 2022-09-07 ENCOUNTER — Encounter: Payer: Self-pay | Admitting: Family Medicine

## 2022-09-07 ENCOUNTER — Ambulatory Visit (INDEPENDENT_AMBULATORY_CARE_PROVIDER_SITE_OTHER): Payer: Medicare Other | Admitting: Family Medicine

## 2022-09-07 VITALS — BP 111/66 | HR 98 | Ht 65.0 in | Wt 165.6 lb

## 2022-09-07 DIAGNOSIS — R8290 Unspecified abnormal findings in urine: Secondary | ICD-10-CM | POA: Diagnosis not present

## 2022-09-07 DIAGNOSIS — E7849 Other hyperlipidemia: Secondary | ICD-10-CM

## 2022-09-07 DIAGNOSIS — Z9181 History of falling: Secondary | ICD-10-CM | POA: Diagnosis not present

## 2022-09-07 DIAGNOSIS — E559 Vitamin D deficiency, unspecified: Secondary | ICD-10-CM | POA: Diagnosis not present

## 2022-09-07 DIAGNOSIS — Z7901 Long term (current) use of anticoagulants: Secondary | ICD-10-CM | POA: Diagnosis not present

## 2022-09-07 DIAGNOSIS — I10 Essential (primary) hypertension: Secondary | ICD-10-CM | POA: Diagnosis not present

## 2022-09-07 DIAGNOSIS — I4891 Unspecified atrial fibrillation: Secondary | ICD-10-CM

## 2022-09-07 DIAGNOSIS — E038 Other specified hypothyroidism: Secondary | ICD-10-CM | POA: Diagnosis not present

## 2022-09-07 DIAGNOSIS — F411 Generalized anxiety disorder: Secondary | ICD-10-CM

## 2022-09-07 DIAGNOSIS — H9193 Unspecified hearing loss, bilateral: Secondary | ICD-10-CM

## 2022-09-07 NOTE — Assessment & Plan Note (Signed)
Controlled, no change in medication  

## 2022-09-07 NOTE — Assessment & Plan Note (Signed)
Controlled, no change in medication DASH diet and commitment to daily physical activity for a minimum of 30 minutes discussed and encouraged, as a part of hypertension management. The importance of attaining a healthy weight is also discussed.     09/07/2022    3:37 PM 08/14/2022   10:52 AM 07/18/2022   11:13 AM 07/11/2022   11:24 AM 04/20/2022    3:58 PM 01/06/2022    2:13 PM 01/04/2022   11:04 AM  BP/Weight  Systolic BP 902 409 735 329 924 268 341  Diastolic BP 66 73 64 70 72 74 60  Wt. (Lbs) 165.6 164.8 164.8 162.2 164 165 165  BMI 27.56 kg/m2 27.42 kg/m2 27.42 kg/m2 26.18 kg/m2 26.47 kg/m2 27.46 kg/m2 27.46 kg/m2

## 2022-09-07 NOTE — Assessment & Plan Note (Signed)
Only symptom of UTI, pt to submit specimen for testing has h/o recurrent/ chronic bacturia

## 2022-09-07 NOTE — Assessment & Plan Note (Signed)
Home safety and fall risk reduction reviewed briefly

## 2022-09-07 NOTE — Assessment & Plan Note (Signed)
Hyperlipidemia:Low fat diet discussed and encouraged.   Lipid Panel  Lab Results  Component Value Date   CHOL 138 04/18/2022   HDL 56 04/18/2022   LDLCALC 60 04/18/2022   TRIG 123 04/18/2022   CHOLHDL 2.5 04/18/2022     Controlled, no change in medication Updated lab needed at/ before next visit.

## 2022-09-07 NOTE — Assessment & Plan Note (Signed)
Updated lab needed at/ before next visit.   

## 2022-09-07 NOTE — Assessment & Plan Note (Signed)
No change, will not wear hearing aids, able to converse at close distance

## 2022-09-07 NOTE — Assessment & Plan Note (Signed)
Followed by Cardiology 

## 2022-09-07 NOTE — Progress Notes (Signed)
[name]     MRN: [id number]      DOB: 06-29-1926   HPI [name] is here for follow up and re-evaluation of chronic medical conditions, medication management and review of any available recent lab and radiology data.  Preventive health is updated, specifically  Cancer screening and Immunization.   Questions or concerns regarding consultations or procedures which the PT has had in the interim are  addressed. The PT denies any adverse reactions to current medications since the last visit.  3 day h/o malodorous urine, no fever, chills flank pain or change in appetite or energy No falls since last visit  ROS Denies recent fever or chills. Denies sinus pressure, nasal congestion, ear pain or sore throat. Denies chest congestion, productive cough or wheezing. Denies chest pains, palpitations and leg swelling Denies abdominal pain, nausea, vomiting,diarrhea or constipation.   Denies dysuria, frequency, hesitancy or incontinence. Chronic  joint pain, swelling and limitation in mobility. Denies headaches, seizures, numbness, or tingling. Denies depression, anxiety or insomnia. Denies skin break down or rash.   PE  BP 111/66 (BP Location: Right Arm, Patient Position: Sitting, Cuff Size: Large)   Pulse 98   Ht 5\' 5"  (1.651 m)   Wt 165 lb 9.6 oz (75.1 kg)   SpO2 90%   BMI 27.56 kg/m   Patient alert and oriented and in no cardiopulmonary distress.  HEENT: No facial asymmetry, EOMI,     Neck decreased ROM  Chest: Clear to auscultation bilaterally.  CVS: S1, S2 no murmurs, no S3.Regular rate.  ABD: Soft non tender.   Ext: No edema  MS: Adequate ROM spine, shoulders, hips and knees.  Skin: Intact, no ulcerations or rash noted.  Psych: Good eye contact, normal affect. Memory intact not anxious or depressed appearing.  CNS: CN 2-12 intact, markedly decreased hearing,power,  normal throughout.no focal deficits noted.   Assessment & Plan  Essential  hypertension Controlled, no change in medication DASH diet and commitment to daily physical activity for a minimum of 30 minutes discussed and encouraged, as a part of hypertension management. The importance of attaining a healthy weight is also discussed.     09/07/2022    3:37 PM 08/14/2022   10:52 AM 07/18/2022   11:13 AM 07/11/2022   11:24 AM 04/20/2022    3:58 PM 01/06/2022    2:13 PM 01/04/2022   11:04 AM  BP/Weight  Systolic BP 782 423 536 144 315 400 867  Diastolic BP 66 73 64 70 72 74 60  Wt. (Lbs) 165.6 164.8 164.8 162.2 164 165 165  BMI 27.56 kg/m2 27.42 kg/m2 27.42 kg/m2 26.18 kg/m2 26.47 kg/m2 27.46 kg/m2 27.46 kg/m2       Atrial fibrillation with RVR (HCC) [place]ate controlled on chronic anticoagullation  Hypothyroidism Controlled, no change in medication   Hearing loss No change, will not wear hearing aids, able to converse at close distance  Malodorous urine Only symptom of UTI, pt to submit specimen for testing has h/o recurrent/ chronic bacturia  Hyperlipidemia Hyperlipidemia:Low fat diet discussed and encouraged.   Lipid Panel  Lab Results  Component Value Date   CHOL 138 04/18/2022   HDL 56 04/18/2022   LDLCALC 60 04/18/2022   TRIG 123 04/18/2022   CHOLHDL 2.5 04/18/2022     Controlled, no change in medication Updated lab needed at/ before next visit.   GAD (generalized anxiety disorder) Controlled, no change in medication   At high risk for falls Home safety and fall risk  reduction reviewed briefly  Chronic anticoagulation Followed by Cardiology  Vitamin D deficiency Updated lab needed at/ before next visit.

## 2022-09-07 NOTE — Assessment & Plan Note (Signed)
Trate controlled on chronic anticoagullation

## 2022-09-07 NOTE — Patient Instructions (Addendum)
F/U in 5 months, call if you need me sooner  Fasting lipid, cmp and E#GFr, TSH and Vit D and CBC Feb 22 or shortly after   Please submit  urine for c/S   Careful not to fall  Thanks for choosing [facility], we consider it a privelige to serve you.

## 2022-09-14 DIAGNOSIS — R8290 Unspecified abnormal findings in urine: Secondary | ICD-10-CM | POA: Diagnosis not present

## 2022-09-16 ENCOUNTER — Other Ambulatory Visit: Payer: Self-pay | Admitting: Family Medicine

## 2022-09-17 ENCOUNTER — Encounter: Payer: Self-pay | Admitting: Family Medicine

## 2022-09-18 ENCOUNTER — Other Ambulatory Visit: Payer: Self-pay

## 2022-09-18 ENCOUNTER — Encounter: Payer: Self-pay | Admitting: Family Medicine

## 2022-09-18 LAB — URINE CULTURE

## 2022-09-18 MED ORDER — NITROFURANTOIN MONOHYD MACRO 100 MG PO CAPS: 100.0000 mg | ORAL_CAPSULE | Freq: Two times a day (BID) | ORAL | 0 refills | Status: DC

## 2022-09-18 NOTE — Telephone Encounter (Signed)
See lab report in chart

## 2022-09-18 NOTE — Addendum Note (Signed)
Addended by: Fayrene Helper on: 09/18/2022 07:19 AM   Modules accepted: Orders

## 2022-10-02 ENCOUNTER — Ambulatory Visit: Payer: Medicare Other | Attending: Cardiology | Admitting: *Deleted

## 2022-10-02 DIAGNOSIS — I480 Paroxysmal atrial fibrillation: Secondary | ICD-10-CM | POA: Diagnosis not present

## 2022-10-02 DIAGNOSIS — Z5181 Encounter for therapeutic drug level monitoring: Secondary | ICD-10-CM | POA: Diagnosis not present

## 2022-10-02 LAB — POCT INR: INR: 2.2 (ref 2.0–3.0)

## 2022-10-02 NOTE — Patient Instructions (Signed)
Continue warfarin 1/2 tablet daily. Recheck in 4 weeks Continue greens 

## 2022-10-03 DIAGNOSIS — R0902 Hypoxemia: Secondary | ICD-10-CM | POA: Diagnosis not present

## 2022-10-22 ENCOUNTER — Encounter: Payer: Self-pay | Admitting: Family Medicine

## 2022-10-23 ENCOUNTER — Other Ambulatory Visit: Payer: Self-pay

## 2022-10-23 DIAGNOSIS — R8290 Unspecified abnormal findings in urine: Secondary | ICD-10-CM

## 2022-10-26 ENCOUNTER — Ambulatory Visit: Payer: Medicare Other

## 2022-10-26 DIAGNOSIS — E038 Other specified hypothyroidism: Secondary | ICD-10-CM | POA: Diagnosis not present

## 2022-10-26 DIAGNOSIS — E559 Vitamin D deficiency, unspecified: Secondary | ICD-10-CM | POA: Diagnosis not present

## 2022-10-26 DIAGNOSIS — I10 Essential (primary) hypertension: Secondary | ICD-10-CM | POA: Diagnosis not present

## 2022-10-26 DIAGNOSIS — I495 Sick sinus syndrome: Secondary | ICD-10-CM | POA: Diagnosis not present

## 2022-10-26 DIAGNOSIS — E7849 Other hyperlipidemia: Secondary | ICD-10-CM | POA: Diagnosis not present

## 2022-10-26 LAB — CUP PACEART REMOTE DEVICE CHECK
Battery Remaining Longevity: 59 mo
Battery Remaining Percentage: 52 %
Battery Voltage: 2.99 V
Brady Statistic RV Percent Paced: 68 %
Date Time Interrogation Session: 20240229020014
Implantable Lead Connection Status: 753985
Implantable Lead Connection Status: 753985
Implantable Lead Implant Date: 20110422
Implantable Lead Implant Date: 20110422
Implantable Lead Location: 753859
Implantable Lead Location: 753860
Implantable Pulse Generator Implant Date: 20180319
Lead Channel Impedance Value: 400 Ohm
Lead Channel Pacing Threshold Amplitude: 1 V
Lead Channel Pacing Threshold Pulse Width: 0.5 ms
Lead Channel Sensing Intrinsic Amplitude: 7.5 mV
Lead Channel Setting Pacing Amplitude: 2 V
Lead Channel Setting Pacing Pulse Width: 0.5 ms
Lead Channel Setting Sensing Sensitivity: 1 mV
Pulse Gen Model: 2272
Pulse Gen Serial Number: 8010902

## 2022-10-27 LAB — CBC
Hematocrit: 34 % (ref 34.0–46.6)
Hemoglobin: 11.1 g/dL (ref 11.1–15.9)
MCH: 29.1 pg (ref 26.6–33.0)
MCHC: 32.6 g/dL (ref 31.5–35.7)
MCV: 89 fL (ref 79–97)
Platelets: 367 10*3/uL (ref 150–450)
RBC: 3.82 x10E6/uL (ref 3.77–5.28)
RDW: 12.9 % (ref 11.7–15.4)
WBC: 7.9 10*3/uL (ref 3.4–10.8)

## 2022-10-27 LAB — CMP14+EGFR
ALT: 9 IU/L (ref 0–32)
AST: 15 IU/L (ref 0–40)
Albumin/Globulin Ratio: 1.5 (ref 1.2–2.2)
Albumin: 4.2 g/dL (ref 3.6–4.6)
Alkaline Phosphatase: 112 IU/L (ref 44–121)
BUN/Creatinine Ratio: 14 (ref 12–28)
BUN: 20 mg/dL (ref 10–36)
Bilirubin Total: 0.3 mg/dL (ref 0.0–1.2)
CO2: 25 mmol/L (ref 20–29)
Calcium: 9.5 mg/dL (ref 8.7–10.3)
Chloride: 100 mmol/L (ref 96–106)
Creatinine, Ser: 1.48 mg/dL — ABNORMAL HIGH (ref 0.57–1.00)
Globulin, Total: 2.8 g/dL (ref 1.5–4.5)
Glucose: 113 mg/dL — ABNORMAL HIGH (ref 70–99)
Potassium: 4.7 mmol/L (ref 3.5–5.2)
Sodium: 143 mmol/L (ref 134–144)
Total Protein: 7 g/dL (ref 6.0–8.5)
eGFR: 32 mL/min/{1.73_m2} — ABNORMAL LOW (ref >59)

## 2022-10-27 LAB — LIPID PANEL
Chol/HDL Ratio: 2.6 ratio (ref 0.0–4.4)
Cholesterol, Total: 151 mg/dL (ref 100–199)
HDL: 58 mg/dL (ref >39)
LDL Chol Calc (NIH): 67 mg/dL (ref 0–99)
Triglycerides: 152 mg/dL — ABNORMAL HIGH (ref 0–149)
VLDL Cholesterol Cal: 26 mg/dL (ref 5–40)

## 2022-10-27 LAB — TSH: TSH: 1.11 u[IU]/mL (ref 0.450–4.500)

## 2022-10-27 LAB — VITAMIN D 25 HYDROXY (VIT D DEFICIENCY, FRACTURES): Vit D, 25-Hydroxy: 63.3 ng/mL (ref 30.0–100.0)

## 2022-10-30 ENCOUNTER — Ambulatory Visit: Payer: Medicare Other | Attending: Cardiology | Admitting: *Deleted

## 2022-10-30 DIAGNOSIS — I480 Paroxysmal atrial fibrillation: Secondary | ICD-10-CM | POA: Diagnosis not present

## 2022-10-30 DIAGNOSIS — Z5181 Encounter for therapeutic drug level monitoring: Secondary | ICD-10-CM | POA: Diagnosis not present

## 2022-10-30 LAB — POCT INR: INR: 2.6 (ref 2.0–3.0)

## 2022-10-30 NOTE — Patient Instructions (Signed)
Continue warfarin 1/2 tablet daily. Recheck in 4 weeks Continue greens

## 2022-10-31 ENCOUNTER — Encounter: Payer: Self-pay | Admitting: Family Medicine

## 2022-10-31 DIAGNOSIS — R8290 Unspecified abnormal findings in urine: Secondary | ICD-10-CM | POA: Diagnosis not present

## 2022-11-01 DIAGNOSIS — R0902 Hypoxemia: Secondary | ICD-10-CM | POA: Diagnosis not present

## 2022-11-03 ENCOUNTER — Other Ambulatory Visit: Payer: Self-pay | Admitting: Cardiology

## 2022-11-04 LAB — URINE CULTURE

## 2022-11-05 ENCOUNTER — Encounter: Payer: Self-pay | Admitting: Family Medicine

## 2022-11-05 MED ORDER — NITROFURANTOIN MONOHYD MACRO 100 MG PO CAPS: 100.0000 mg | ORAL_CAPSULE | Freq: Two times a day (BID) | ORAL | 0 refills | Status: DC

## 2022-11-05 MED ORDER — NITROFURANTOIN MONOHYD MACRO 100 MG PO CAPS: ORAL_CAPSULE | ORAL | 5 refills | Status: DC

## 2022-11-05 NOTE — Addendum Note (Signed)
Addended by: Fayrene Helper on: 11/05/2022 08:25 AM   Modules accepted: Orders

## 2022-11-20 ENCOUNTER — Other Ambulatory Visit: Payer: Self-pay | Admitting: Family Medicine

## 2022-11-20 MED ORDER — OMEPRAZOLE 40 MG PO CPDR: DELAYED_RELEASE_CAPSULE | ORAL | 0 refills | Status: DC

## 2022-11-20 MED ORDER — DONEPEZIL HCL 10 MG PO TABS: 10.0000 mg | ORAL_TABLET | Freq: Every day | ORAL | 0 refills | Status: DC

## 2022-11-27 ENCOUNTER — Ambulatory Visit: Payer: Medicare Other | Attending: Cardiology | Admitting: *Deleted

## 2022-11-27 DIAGNOSIS — I480 Paroxysmal atrial fibrillation: Secondary | ICD-10-CM

## 2022-11-27 DIAGNOSIS — Z5181 Encounter for therapeutic drug level monitoring: Secondary | ICD-10-CM | POA: Diagnosis not present

## 2022-11-27 LAB — POCT INR: INR: 2.3 (ref 2.0–3.0)

## 2022-11-27 NOTE — Progress Notes (Signed)
Remote pacemaker transmission.   

## 2022-11-27 NOTE — Patient Instructions (Signed)
Continue warfarin 1/2 tablet daily. Recheck in 6 weeks Continue greens Taking Macrobid 1 tablet 3 x wk

## 2022-12-02 DIAGNOSIS — R0902 Hypoxemia: Secondary | ICD-10-CM | POA: Diagnosis not present

## 2022-12-22 ENCOUNTER — Other Ambulatory Visit: Payer: Self-pay | Admitting: Family Medicine

## 2023-01-01 DIAGNOSIS — R0902 Hypoxemia: Secondary | ICD-10-CM | POA: Diagnosis not present

## 2023-01-04 ENCOUNTER — Ambulatory Visit: Payer: Medicare Other | Attending: Cardiology | Admitting: Cardiology

## 2023-01-04 ENCOUNTER — Encounter: Payer: Self-pay | Admitting: Cardiology

## 2023-01-04 VITALS — BP 116/64 | HR 70 | Ht 66.0 in | Wt 164.0 lb

## 2023-01-04 DIAGNOSIS — Z950 Presence of cardiac pacemaker: Secondary | ICD-10-CM | POA: Diagnosis not present

## 2023-01-04 DIAGNOSIS — E782 Mixed hyperlipidemia: Secondary | ICD-10-CM | POA: Diagnosis not present

## 2023-01-04 DIAGNOSIS — I4891 Unspecified atrial fibrillation: Secondary | ICD-10-CM

## 2023-01-04 DIAGNOSIS — D6869 Other thrombophilia: Secondary | ICD-10-CM | POA: Diagnosis not present

## 2023-01-04 DIAGNOSIS — I10 Essential (primary) hypertension: Secondary | ICD-10-CM

## 2023-01-04 NOTE — Progress Notes (Signed)
Clinical Summary [name] is a 89 y.o.female seen today for follow up of the following medical problems     1. Paroxysmal afib - Has not been interested in NOACs. Has been on coumadin    - 2-3 minutes daily of palpitaitons, overall mild - no bleeding on coumadin       2. Symptomatic bradycardia with pacemaker - followed by EP  09/2022 normal device check.    3. HTN - compliant with meds - she is compliant with meds       4. Hyperlipidemia  - she is on lovstatin   02/2021 TC 143 TG 134 HDL 60 LDL 60 Jan 2023 TC 130 TG 865 HDL 59 LDL 60 03/2022 TC 138 TG 123 HDL 56 LDL 60  - 09/2022 TC 151 TG 152 HDL 58 LDL 67    5. Edema - 06/24/20 labs Cr 1.2 BUN 26 BNP 2779  - 06/2020 echo LVEF 55-60%, indet DDx  - swelling is up and down, though primarily controled - taking toresmide 40mg  alteranting days with 20mg  - home weights 159-161 lbs which is stable over time   Sep 07 2021 lowered torsemide from 40mg  alternating days with 20mg  to 20mg  daily. Cr had elevated to 1.91, with change down to 1.49 which was prior baseline.     - mild edema at times, taking torsemide 20mg  daily. Will take additional as needed - home weights usually 163-165      SH: daughter of [name] who is also a patient of mine. She has a great grandsone here with her today started kindergarten, a great grandaughter who is 64 yos Past Medical History:  Diagnosis Date   Allergy    Annual physical exam 04/22/2015   Anxiety    Arthritis    Arthritis    Asthma    Atrial fibrillation (HCC)    Bleeding disorder (HCC)    CHF (congestive heart failure) (HCC)    CKD (chronic kidney disease) stage 4, GFR 15-29 ml/min (HCC) 09/08/2021   COPD (chronic obstructive pulmonary disease) (HCC)    Depression    Gastroesophageal reflux disease    Hearing impairment    Heart disease    Heart murmur    High cholesterol    Hyperlipidemia    Hypertension    Hypothyroidism    Impaired glucose tolerance     Macular degeneration    Oxygen deficiency    Pancreatitis    Paroxysmal atrial fibrillation (HCC) 2011   Sick sinus syndrome (HCC) 2011   Atrial fibrilation 10/2009; and dual chamber pacemaker in 4/11; normal EF   Sleep apnea    Nocturnal oxygen therapy   Zoster 2016     No Known Allergies   Current Outpatient Medications  Medication Sig Dispense Refill   Ascorbic Acid (VITAMIN C PO) Take 1 tablet by mouth daily.     busPIRone (BUSPAR) 7.5 MG tablet TAKE (1) TABLET BY MOUTH TWICE DAILY. 60 tablet 0   Cholecalciferol (VITAMIN D3 PO) Take 2 tablets by mouth daily.     clobetasol cream (TEMOVATE) 0.05 % Apply 1 Application topically 2 (two) times daily. Apply sparingly twice daily to rash on neck for 1 week, then as needed 45 g 0   Cyanocobalamin (VITAMIN B12 PO) Take 1 tablet by mouth daily.     diltiazem (CARDIZEM CD) 300 MG 24 hr capsule Take 1 capsule (300 mg total) by mouth daily. 90 capsule 3   donepezil (ARICEPT) 10 MG tablet  Take 1 tablet (10 mg total) by mouth at bedtime. 90 tablet 0   fluticasone (FLONASE) 50 MCG/ACT nasal spray Place 1 spray into both nostrils daily.     levothyroxine (SYNTHROID) 112 MCG tablet TAKE 1 TABLET BY MOUTH ONCE DAILY. 90 tablet 0   loratadine (CLARITIN) 10 MG tablet Take 1 tablet (10 mg total) by mouth daily. 30 tablet 1   lovastatin (MEVACOR) 40 MG tablet TAKE ONE TABLET BY MOUTH AT BEDTIME. 90 tablet 2   meclizine (ANTIVERT) 12.5 MG tablet Take 1 tablet (12.5 mg total) by mouth 2 (two) times daily as needed for dizziness. 30 tablet 0   metoprolol tartrate (LOPRESSOR) 100 MG tablet TAKE 1 TABLET BY MOUTH TWICE A DAY. 180 tablet 3   metoprolol tartrate (LOPRESSOR) 25 MG tablet TAKE (1) TABLET BY MOUTH TWICE DAILY. (TAKE WITH 100MG ) 180 tablet 3   Multiple Vitamins-Minerals (EYE VITAMINS & MINERALS PO) Take 1 tablet by mouth daily.     nitrofurantoin, macrocrystal-monohydrate, (MACROBID) 100 MG capsule Take one capsule by mouth every Monday,  Wednesday and Friday 12 capsule 5   omeprazole (PRILOSEC) 40 MG capsule TAKE (1) CAPSULE BY MOUTH ONCE DAILY FOR ACID REFLUX. 90 capsule 0   omeprazole (PRILOSEC) 40 MG capsule Take once daily 90 capsule 0   OXYGEN-HELIUM IN Inhale 2 L into the lungs at bedtime.     torsemide (DEMADEX) 20 MG tablet Take 20 mg by mouth daily.     warfarin (COUMADIN) 5 MG tablet TAKE 1/2 TABLET BY MOUTH DAILY EXCEPT 1 TABLET ON THURSDAYS OR AS DIRECTED 25 tablet 5   zinc gluconate 50 MG tablet Take 50 mg by mouth daily.     nitrofurantoin, macrocrystal-monohydrate, (MACROBID) 100 MG capsule Take 1 capsule (100 mg total) by mouth 2 (two) times daily. 14 capsule 0   Current Facility-Administered Medications  Medication Dose Route Frequency Provider Last Rate Last Admin   miconazole (MONISTAT 7) 2 % vaginal cream 1 Applicatorful  1 Applicatorful Vaginal QHS Tilda Burrow, MD         Past Surgical History:  Procedure Laterality Date   ABDOMINAL HYSTERECTOMY     CATARACT EXTRACTION     Bilateral   COLONOSCOPY  08/29/2007   Dr. Jena Gauss   EYE SURGERY     laser    PACEMAKER INSERTION  08/28/2009   dual chamber    PPM GENERATOR CHANGEOUT N/A 11/13/2016   Procedure: PPM Generator Changeout;  Surgeon: Marinus Maw, MD;  Location: [facility];  Service: Cardiovascular;  Laterality: N/A;   SALPINGOOPHORECTOMY  08/28/1968   right     No Known Allergies    Family History  Problem Relation Age of Onset   Cancer Mother        gential   Cancer Father        throat    Pneumonia Sister    Emphysema Sister    Mitral valve prolapse Sister    Heart disease Other    Arthritis Other    Lung disease Other    Asthma Other    Melanoma Son    Anxiety disorder Daughter    Arthritis Daughter    Asthma Daughter    COPD Daughter    Depression Daughter    Kidney disease Daughter    Obesity Daughter    Anxiety disorder Daughter    Anxiety disorder Daughter    Arthritis Daughter      Social  History Ms. Rasmus reports that she has  never smoked. She has never been exposed to tobacco smoke. She has never used smokeless tobacco. Ms. Niebel reports no history of alcohol use.   Review of Systems CONSTITUTIONAL: No weight loss, fever, chills, weakness or fatigue.  HEENT: Eyes: No visual loss, blurred vision, double vision or yellow sclerae.No hearing loss, sneezing, congestion, runny nose or sore throat.  SKIN: No rash or itching.  CARDIOVASCULAR: per hpi RESPIRATORY: No shortness of breath, cough or sputum.  GASTROINTESTINAL: No anorexia, nausea, vomiting or diarrhea. No abdominal pain or blood.  GENITOURINARY: No burning on urination, no polyuria NEUROLOGICAL: No headache, dizziness, syncope, paralysis, ataxia, numbness or tingling in the extremities. No change in bowel or bladder control.  MUSCULOSKELETAL: No muscle, back pain, joint pain or stiffness.  LYMPHATICS: No enlarged nodes. No history of splenectomy.  PSYCHIATRIC: No history of depression or anxiety.  ENDOCRINOLOGIC: No reports of sweating, cold or heat intolerance. No polyuria or polydipsia.  Marland Kitchen   Physical Examination Vitals:   01/04/23 1136  BP: 116/64  Pulse: 70  SpO2: 96%   Filed Weights   01/04/23 1136  Weight: 164 lb (74.4 kg)    Gen: resting comfortably, no acute distress HEENT: no scleral icterus, pupils equal round and reactive, no palptable cervical adenopathy,  CV: RRR, no mrg, no jvd Resp: Clear to auscultation bilaterally GI: abdomen is soft, non-tender, non-distended, normal bowel sounds, no hepatosplenomegaly MSK: extremities are warm, no edema.  Skin: warm, no rash Neuro:  no focal deficits Psych: appropriate affect   Diagnostic Studies 07/04/19 echo IMPRESSIONS      1. Left ventricular ejection fraction, by visual estimation, is 65 to 70%. The left ventricle has normal function. There is mildly increased left ventricular hypertrophy.  2. Left ventricular diastolic parameters are  indeterminate in the setting of atrial fibrillation.  3. Global right ventricle has normal systolic function.The right ventricular size is mildly enlarged. No increase in right ventricular wall thickness.  4. Left atrial size was upper normal.  5. Right atrial size was upper normal.  6. Mild mitral annular calcification.  7. Moderate aortic valve annular calcification.  8. The mitral valve is grossly normal. Mild mitral valve regurgitation.  9. The tricuspid valve is grossly normal. Tricuspid valve regurgitation is trivial. 10. The aortic valve is tricuspid. Aortic valve regurgitation is not visualized. Mild aortic valve sclerosis without stenosis. 11. The pulmonic valve was grossly normal. Pulmonic valve regurgitation is trivial. 12. A pacer wire is visualized in the RV and RA. 13. The inferior vena cava is normal in size with greater than 50% respiratory variability, suggesting right atrial pressure of 3 mmHg. 14. Mildly elevated pulmonary artery systolic pressure. 15. The tricuspid regurgitant velocity is 2.71 m/s, and with an assumed right atrial pressure of 3 mmHg, the estimated right ventricular systolic pressure is mildly elevated at 32.4 mmHg.      Assessment and Plan   1. Afib/acquired thrombophilia - not intersted in NOACs, she will continue coumadin - mild palpitations at times, not to degree she is interested in adjusting meds. Continue current therapy.    2. LE edema -doing well on torsemide 20mg  daily, will take additional 20mg  prn. AKI when taking 40mg  alternating days with 20mg  that has resolved on lower dose - we will continue torsemide 20mg  daily   3. Hyperlipidemia - at goal, continue current meds   4 HTN -at goal, continue current meds  5. Pacemaker - no symptoms, normal function by recent device check - conitnue to monitor  Arnoldo Lenis, M.D.

## 2023-01-04 NOTE — Patient Instructions (Signed)
Medication Instructions:  Your physician recommends that you continue on your current medications as directed. Please refer to the Current Medication list given to you today.  *If you need a refill on your cardiac medications before your next appointment, please call your pharmacy*   Lab Work: None If you have labs (blood work) drawn today and your tests are completely normal, you will receive your results only by: MyChart Message (if you have MyChart) OR A paper copy in the mail If you have any lab test that is abnormal or we need to change your treatment, we will call you to review the results.   Testing/Procedures: None   Follow-Up: At [facility], you and your health needs are our priority.  As part of our continuing mission to provide you with exceptional heart care, we have created designated Provider Care Teams.  These Care Teams include your primary Cardiologist (physician) and Advanced Practice Providers (APPs -  Physician Assistants and Nurse Practitioners) who all work together to provide you with the care you need, when you need it.  We recommend signing up for the patient portal called "MyChart".  Sign up information is provided on this After Visit Summary.  MyChart is used to connect with patients for Virtual Visits (Telemedicine).  Patients are able to view lab/test results, encounter notes, upcoming appointments, etc.  Non-urgent messages can be sent to your provider as well.   To learn more about what you can do with MyChart, go to [email or web address].    Your next appointment:   6 month(s)  Provider:   Jonathan Branch, MD    Other Instructions    

## 2023-01-08 ENCOUNTER — Ambulatory Visit: Payer: Medicare Other | Attending: Cardiology | Admitting: *Deleted

## 2023-01-08 DIAGNOSIS — Z5181 Encounter for therapeutic drug level monitoring: Secondary | ICD-10-CM | POA: Diagnosis not present

## 2023-01-08 DIAGNOSIS — I480 Paroxysmal atrial fibrillation: Secondary | ICD-10-CM

## 2023-01-08 LAB — POCT INR: INR: 2.3 (ref 2.0–3.0)

## 2023-01-08 NOTE — Patient Instructions (Signed)
Continue warfarin 1/2 tablet daily. Recheck in 6 weeks Continue greens Taking Macrobid 1 tablet 3 x wk long term

## 2023-01-25 ENCOUNTER — Ambulatory Visit (INDEPENDENT_AMBULATORY_CARE_PROVIDER_SITE_OTHER): Payer: Medicare Other

## 2023-01-25 DIAGNOSIS — I495 Sick sinus syndrome: Secondary | ICD-10-CM

## 2023-01-25 LAB — CUP PACEART REMOTE DEVICE CHECK
Battery Remaining Longevity: 58 mo
Battery Remaining Percentage: 50 %
Battery Voltage: 2.99 V
Brady Statistic RV Percent Paced: 66 %
Date Time Interrogation Session: 20240530020331
Implantable Lead Connection Status: 753985
Implantable Lead Connection Status: 753985
Implantable Lead Implant Date: 20110422
Implantable Lead Implant Date: 20110422
Implantable Lead Location: 753859
Implantable Lead Location: 753860
Implantable Pulse Generator Implant Date: 20180319
Lead Channel Impedance Value: 410 Ohm
Lead Channel Pacing Threshold Amplitude: 1 V
Lead Channel Pacing Threshold Pulse Width: 0.5 ms
Lead Channel Sensing Intrinsic Amplitude: 10.2 mV
Lead Channel Setting Pacing Amplitude: 2 V
Lead Channel Setting Pacing Pulse Width: 0.5 ms
Lead Channel Setting Sensing Sensitivity: 1 mV
Pulse Gen Model: 2272
Pulse Gen Serial Number: 8010902

## 2023-02-01 ENCOUNTER — Other Ambulatory Visit: Payer: Self-pay | Admitting: Family Medicine

## 2023-02-01 DIAGNOSIS — R0902 Hypoxemia: Secondary | ICD-10-CM | POA: Diagnosis not present

## 2023-02-02 MED ORDER — BUSPIRONE HCL 7.5 MG PO TABS: ORAL_TABLET | ORAL | 0 refills | Status: DC

## 2023-02-08 ENCOUNTER — Ambulatory Visit (INDEPENDENT_AMBULATORY_CARE_PROVIDER_SITE_OTHER): Payer: Medicare Other | Admitting: Family Medicine

## 2023-02-08 ENCOUNTER — Encounter: Payer: Self-pay | Admitting: Family Medicine

## 2023-02-08 ENCOUNTER — Encounter (HOSPITAL_COMMUNITY): Payer: Self-pay | Admitting: Emergency Medicine

## 2023-02-08 ENCOUNTER — Emergency Department (HOSPITAL_COMMUNITY): Payer: Medicare Other

## 2023-02-08 ENCOUNTER — Other Ambulatory Visit: Payer: Self-pay

## 2023-02-08 ENCOUNTER — Emergency Department (HOSPITAL_COMMUNITY)
Admission: EM | Admit: 2023-02-08 | Discharge: 2023-02-08 | Disposition: A | Discharge status: Home / Self Care | Payer: Medicare Other | Attending: Emergency Medicine | Admitting: Emergency Medicine

## 2023-02-08 VITALS — BP 90/60 | HR 111 | Ht 66.0 in | Wt 164.0 lb

## 2023-02-08 DIAGNOSIS — E559 Vitamin D deficiency, unspecified: Secondary | ICD-10-CM

## 2023-02-08 DIAGNOSIS — R531 Weakness: Secondary | ICD-10-CM | POA: Diagnosis present

## 2023-02-08 DIAGNOSIS — E860 Dehydration: Secondary | ICD-10-CM | POA: Diagnosis not present

## 2023-02-08 DIAGNOSIS — E7849 Other hyperlipidemia: Secondary | ICD-10-CM

## 2023-02-08 DIAGNOSIS — I959 Hypotension, unspecified: Secondary | ICD-10-CM | POA: Diagnosis not present

## 2023-02-08 DIAGNOSIS — N390 Urinary tract infection, site not specified: Secondary | ICD-10-CM | POA: Diagnosis not present

## 2023-02-08 DIAGNOSIS — R8290 Unspecified abnormal findings in urine: Secondary | ICD-10-CM

## 2023-02-08 DIAGNOSIS — N179 Acute kidney failure, unspecified: Secondary | ICD-10-CM | POA: Diagnosis not present

## 2023-02-08 DIAGNOSIS — E038 Other specified hypothyroidism: Secondary | ICD-10-CM | POA: Diagnosis not present

## 2023-02-08 DIAGNOSIS — I10 Essential (primary) hypertension: Secondary | ICD-10-CM | POA: Diagnosis not present

## 2023-02-08 DIAGNOSIS — E861 Hypovolemia: Secondary | ICD-10-CM

## 2023-02-08 LAB — CBC WITH DIFFERENTIAL/PLATELET
Abs Immature Granulocytes: 0.03 10*3/uL (ref 0.00–0.07)
Basophils Absolute: 0.1 10*3/uL (ref 0.0–0.1)
Basophils Relative: 1 %
Eosinophils Absolute: 0.3 10*3/uL (ref 0.0–0.5)
Eosinophils Relative: 3 %
HCT: 33.5 % — ABNORMAL LOW (ref 36.0–46.0)
Hemoglobin: 10.6 g/dL — ABNORMAL LOW (ref 12.0–15.0)
Immature Granulocytes: 0 %
Lymphocytes Relative: 23 %
Lymphs Abs: 1.9 10*3/uL (ref 0.7–4.0)
MCH: 29.8 pg (ref 26.0–34.0)
MCHC: 31.6 g/dL (ref 30.0–36.0)
MCV: 94.1 fL (ref 80.0–100.0)
Monocytes Absolute: 0.8 10*3/uL (ref 0.1–1.0)
Monocytes Relative: 9 %
Neutro Abs: 5.4 10*3/uL (ref 1.7–7.7)
Neutrophils Relative %: 64 %
Platelets: 277 10*3/uL (ref 150–400)
RBC: 3.56 MIL/uL — ABNORMAL LOW (ref 3.87–5.11)
RDW: 13.7 % (ref 11.5–15.5)
WBC: 8.5 10*3/uL (ref 4.0–10.5)
nRBC: 0 % (ref 0.0–0.2)

## 2023-02-08 LAB — URINALYSIS, W/ REFLEX TO CULTURE (INFECTION SUSPECTED)
Bilirubin Urine: NEGATIVE
Glucose, UA: NEGATIVE mg/dL
Ketones, ur: NEGATIVE mg/dL
Nitrite: NEGATIVE
Protein, ur: NEGATIVE mg/dL
Specific Gravity, Urine: 1.009 (ref 1.005–1.030)
pH: 6 (ref 5.0–8.0)

## 2023-02-08 LAB — COMPREHENSIVE METABOLIC PANEL
ALT: 9 U/L (ref 0–44)
AST: 16 U/L (ref 15–41)
Albumin: 3.5 g/dL (ref 3.5–5.0)
Alkaline Phosphatase: 94 U/L (ref 38–126)
Anion gap: 9 (ref 5–15)
BUN: 32 mg/dL — ABNORMAL HIGH (ref 8–23)
CO2: 26 mmol/L (ref 22–32)
Calcium: 8.7 mg/dL — ABNORMAL LOW (ref 8.9–10.3)
Chloride: 100 mmol/L (ref 98–111)
Creatinine, Ser: 1.82 mg/dL — ABNORMAL HIGH (ref 0.44–1.00)
GFR, Estimated: 25 mL/min — ABNORMAL LOW (ref >60)
Glucose, Bld: 146 mg/dL — ABNORMAL HIGH (ref 70–99)
Potassium: 4.2 mmol/L (ref 3.5–5.1)
Sodium: 135 mmol/L (ref 135–145)
Total Bilirubin: 0.5 mg/dL (ref 0.3–1.2)
Total Protein: 7 g/dL (ref 6.5–8.1)

## 2023-02-08 LAB — LACTIC ACID, PLASMA: Lactic Acid, Venous: 1.4 mmol/L (ref 0.5–1.9)

## 2023-02-08 MED ORDER — SODIUM CHLORIDE 0.9 % IV BOLUS
1000.0000 mL | Freq: Once | INTRAVENOUS | Status: AC
  Administered 2023-02-08: 1000 mL via INTRAVENOUS

## 2023-02-08 MED ORDER — NITROFURANTOIN MONOHYD MACRO 100 MG PO CAPS
100.0000 mg | ORAL_CAPSULE | Freq: Two times a day (BID) | ORAL | Status: DC
  Administered 2023-02-08: 100 mg via ORAL
  Filled 2023-02-08: qty 1

## 2023-02-08 MED ORDER — NITROFURANTOIN MONOHYD MACRO 100 MG PO CAPS: ORAL_CAPSULE | ORAL | 0 refills | Status: DC

## 2023-02-08 NOTE — ED Provider Notes (Signed)
[place] EMERGENCY DEPARTMENT AT [facility] Provider Note   CSN: [id number] Arrival date & time: 02/08/23  1854     History  Chief Complaint  Patient presents with   Hypotension    [name] is a 89 y.o. female.  She saw her primary care doctor today for routine visit and was found to have a low blood pressure.  Family states she does not drink enough fluids and gets frequent UTIs.  Had some labs done showing increased white count and increased creatinine.  Patient denies any complaints no fevers chills cough shortness of breath nausea vomiting diarrhea or urinary symptoms.  She is asking when she can go home.  The history is provided by the patient and a relative.  Weakness Severity:  Mild Chronicity:  Recurrent Relieved by:  None tried Worsened by:  Activity Ineffective treatments:  None tried Associated symptoms: no abdominal pain, no chest pain, no cough, no diarrhea, no dysuria, no fever, no nausea, no shortness of breath and no vomiting        Home Medications Prior to Admission medications   Medication Sig Start Date End Date Taking? Authorizing Provider  Ascorbic Acid (VITAMIN C PO) Take 1 tablet by mouth daily.    [provider]  busPIRone (BUSPAR) 7.5 MG tablet TAKE (1) TABLET BY MOUTH TWICE DAILY. 02/02/23   Kerri Perches, MD  Cholecalciferol (VITAMIN D3 PO) Take 2 tablets by mouth daily.    [provider]  clobetasol cream (TEMOVATE) 0.05 % Apply 1 Application topically 2 (two) times daily. Apply sparingly twice daily to rash on neck for 1 week, then as needed 04/20/22   Kerri Perches, MD  Cyanocobalamin (VITAMIN B12 PO) Take 1 tablet by mouth daily.    [provider]  diltiazem (CARDIZEM CD) 300 MG 24 hr capsule Take 1 capsule (300 mg total) by mouth daily. 07/03/22   Antoine Poche, MD  donepezil (ARICEPT) 10 MG tablet Take 1 tablet (10 mg total) by mouth at bedtime. 11/20/22   Kerri Perches, MD   fluticasone (FLONASE) 50 MCG/ACT nasal spray Place 1 spray into both nostrils daily.    [provider]  levothyroxine (SYNTHROID) 112 MCG tablet TAKE 1 TABLET BY MOUTH ONCE DAILY. 12/22/22   Kerri Perches, MD  loratadine (CLARITIN) 10 MG tablet Take 1 tablet (10 mg total) by mouth daily. 11/13/19   Freddy Finner, NP  lovastatin (MEVACOR) 40 MG tablet TAKE ONE TABLET BY MOUTH AT BEDTIME. 11/03/22   Antoine Poche, MD  meclizine (ANTIVERT) 12.5 MG tablet Take 1 tablet (12.5 mg total) by mouth 2 (two) times daily as needed for dizziness. 05/15/19   Kerri Perches, MD  metoprolol tartrate (LOPRESSOR) 100 MG tablet TAKE 1 TABLET BY MOUTH TWICE A DAY. 04/11/22   Antoine Poche, MD  metoprolol tartrate (LOPRESSOR) 25 MG tablet TAKE (1) TABLET BY MOUTH TWICE DAILY. (TAKE WITH 100MG ) 06/14/22   Strader, Lennart Pall, PA-C  Multiple Vitamins-Minerals (EYE VITAMINS & MINERALS PO) Take 1 tablet by mouth daily.    [provider]  nitrofurantoin, macrocrystal-monohydrate, (MACROBID) 100 MG capsule Take one capsule by mouth every Monday, Wednesday and Friday 11/13/22   Kerri Perches, MD  omeprazole (PRILOSEC) 40 MG capsule TAKE (1) CAPSULE BY MOUTH ONCE DAILY FOR ACID REFLUX. 07/24/22   Kerri Perches, MD  omeprazole (PRILOSEC) 40 MG capsule Take once daily 11/20/22   Kerri Perches, MD  OXYGEN-HELIUM IN  Inhale 2 L into the lungs at bedtime.    [provider]  torsemide (DEMADEX) 20 MG tablet Take 20 mg by mouth daily.    [provider]  warfarin (COUMADIN) 5 MG tablet TAKE 1/2 TABLET BY MOUTH DAILY EXCEPT 1 TABLET ON THURSDAYS OR AS DIRECTED 05/03/22   Jonelle Sidle, MD  zinc gluconate 50 MG tablet Take 50 mg by mouth daily.    [provider]  benzonatate (TESSALON PERLES) 100 MG capsule Take 1 capsule (100 mg total) by mouth every 6 (six) hours as needed for cough. 02/05/13 11/04/18  Kerri Perches, MD      Allergies    Patient  has no known allergies.    Review of Systems   Review of Systems  Constitutional:  Negative for fever.  Respiratory:  Negative for cough and shortness of breath.   Cardiovascular:  Negative for chest pain.  Gastrointestinal:  Negative for abdominal pain, diarrhea, nausea and vomiting.  Genitourinary:  Negative for dysuria.  Neurological:  Positive for weakness.    Physical Exam Updated Vital Signs BP 114/67 (BP Location: Right Arm)   Pulse 71   Temp 98.8 F (37.1 C) (Oral)   Resp 20   Ht 5\' 6"  (1.676 m)   Wt 74.4 kg   SpO2 97%   BMI 26.47 kg/m  Physical Exam Vitals and nursing note reviewed.  Constitutional:      General: She is not in acute distress.    Appearance: Normal appearance. She is well-developed.  HENT:     Head: Normocephalic and atraumatic.  Eyes:     Conjunctiva/sclera: Conjunctivae normal.  Cardiovascular:     Rate and Rhythm: Normal rate and regular rhythm.     Heart sounds: No murmur heard. Pulmonary:     Effort: Pulmonary effort is normal. No respiratory distress.     Breath sounds: Normal breath sounds.  Abdominal:     Palpations: Abdomen is soft.     Tenderness: There is no abdominal tenderness. There is no guarding or rebound.  Musculoskeletal:     Cervical back: Neck supple.  Skin:    General: Skin is warm and dry.     Capillary Refill: Capillary refill takes less than 2 seconds.  Neurological:     General: No focal deficit present.     Mental Status: She is alert.     ED Results / Procedures / Treatments   Labs (all labs ordered are listed, but only abnormal results are displayed) Labs Reviewed  COMPREHENSIVE METABOLIC PANEL - Abnormal; Notable for the following components:      Result Value   Glucose, Bld 146 (*)    BUN 32 (*)    Creatinine, Ser 1.82 (*)    Calcium 8.7 (*)    GFR, Estimated 25 (*)    All other components within normal limits  CBC WITH DIFFERENTIAL/PLATELET - Abnormal; Notable for the following components:   RBC  3.56 (*)    Hemoglobin 10.6 (*)    HCT 33.5 (*)    All other components within normal limits  URINALYSIS, W/ REFLEX TO CULTURE (INFECTION SUSPECTED) - Abnormal; Notable for the following components:   APPearance HAZY (*)    Hgb urine dipstick LARGE (*)    Leukocytes,Ua LARGE (*)    Bacteria, UA MANY (*)    All other components within normal limits  URINE CULTURE  LACTIC ACID, PLASMA    EKG None  Radiology DG Chest 2 View  Result Date:  02/08/2023 CLINICAL DATA:  Elevated white cell count.  Hypotension. EXAM: CHEST - 2 VIEW COMPARISON:  07/03/2019 FINDINGS: Cardiac pacemaker. Shallow inspiration. Heart size and pulmonary vascularity are normal for technique. Lungs are clear. No pleural effusions. No pneumothorax. Mild peribronchial thickening and central interstitial pattern suggesting chronic bronchitis. Mediastinal contours appear intact. IMPRESSION: Chronic bronchitic changes in the lungs. No evidence of active pulmonary disease. Electronically Signed   By: Burman Nieves M.D.   On: 02/08/2023 19:59    Procedures Procedures    Medications Ordered in ED Medications  sodium chloride 0.9 % bolus 1,000 mL (has no administration in time range)    ED Course/ Medical Decision Making/ A&P Clinical Course as of 02/09/23 0953  Thu Feb 08, 2023  2338 Daughter states patient is taking Macrobid 3 times a week.  I reviewed her last urine culture and she does have penicillin and cephalosporin resistance.  Macrobid is not the greatest choice although it looks to be one of the safer ones as opposed to putting her on Bactrim.  I asked the daughter to reach out to Dr. Lodema Hong tomorrow to see if that is what she wants her to continue or if she wants her on something else.  Otherwise her labs showing mild AKI.  No indications for admission at this time. [MB]    Clinical Course User Index [MB] Terrilee Files, MD                             Medical Decision Making Amount and/or Complexity of  Data Reviewed Labs: ordered. Radiology: ordered.  Risk Prescription drug management.   This patient complains of no complaints.  Concern for dehydration and low blood pressure possible UTI; this involves an extensive number of treatment Options and is a complaint that carries with it a high risk of complications and morbidity. The differential includes dehydration, hypovolemia, AKI, metabolic derangement, anemia, infection including UTI, pneumonia  I ordered, reviewed and interpreted labs, which included CBC with normal white count low stable hemoglobin, chemistries with slightly worsened CKD, urinalysis possible signs of infection sent for culture I ordered medication IV fluids and reviewed PMP when indicated. I ordered imaging studies which included chest x-ray and I independently    visualized and interpreted imaging which showed chronic bronchitic changes Additional history obtained from patient's daughter Previous records obtained and reviewed in epic including recent PCP visits  Cardiac monitoring reviewed, sinus rhythm Social determinants considered, patient with social isolation stress decreased physical activity Critical Interventions: None  After the interventions stated above, I reevaluated the patient and found patient to be asymptomatic with improved blood pressure after IV fluids Admission and further testing considered, no indications for admission at this time.  Looks like patient is also already on chronic suppressive therapy with Macrobid.  Will have daughter reach out to PCP to determine which antibiotic if any patient needs to be put on.  Return instructions discussed         Final Clinical Impression(s) / ED Diagnoses Final diagnoses:  Dehydration  Lower urinary tract infectious disease    Rx / DC Orders ED Discharge Orders     None         Terrilee Files, MD 02/09/23 [id number]

## 2023-02-08 NOTE — Discharge Instructions (Signed)
You are seen in the emergency department for low blood pressure possible dehydration.  You were given some IV fluids.  Your urine showed possible infection.  You were given a dose of antibiotics.  Please talk with your primary care doctor regarding continuation of antibiotics.  Return to the emergency department if any worsening or concerning symptoms

## 2023-02-08 NOTE — ED Triage Notes (Signed)
Pt via POV sent by PCP for hypotension 90/63 and abnormal blood work reflecting dehydration. Pt is fatigued but otherwise asymptomatic.

## 2023-02-08 NOTE — Patient Instructions (Signed)
Annual exam early September, call if you need me sooner  Stat chem 7 and and EGFr, Magnesium and cBC, todAY   Blood pressure is low today, we  will be in touch re labs this afternoon and also send message to your Casdiologist  Fasting lipid, cmp and EGFr, TSH, Vit d 3 to 5 days before next visit  Urine for c/S only  Nurse please document temperature, let me know if she has a fever please  Careful, no more falls/ sitting on the floor!  Please consider Depends at bedtime  Thanks for choosing [facility], we consider it a privelige to serve you.

## 2023-02-09 ENCOUNTER — Encounter: Payer: Self-pay | Admitting: Family Medicine

## 2023-02-09 LAB — CBC
Hematocrit: 33.5 % — ABNORMAL LOW (ref 34.0–46.6)
Hemoglobin: 11 g/dL — ABNORMAL LOW (ref 11.1–15.9)
MCH: 29.3 pg (ref 26.6–33.0)
MCHC: 32.8 g/dL (ref 31.5–35.7)
MCV: 89 fL (ref 79–97)
Platelets: 299 10*3/uL (ref 150–450)
RBC: 3.76 x10E6/uL — ABNORMAL LOW (ref 3.77–5.28)
RDW: 13.1 % (ref 11.7–15.4)
WBC: 11.2 10*3/uL — ABNORMAL HIGH (ref 3.4–10.8)

## 2023-02-09 LAB — BMP8+EGFR
BUN/Creatinine Ratio: 15 (ref 12–28)
BUN: 29 mg/dL (ref 10–36)
CO2: 26 mmol/L (ref 20–29)
Calcium: 9.6 mg/dL (ref 8.7–10.3)
Chloride: 101 mmol/L (ref 96–106)
Creatinine, Ser: 1.91 mg/dL — ABNORMAL HIGH (ref 0.57–1.00)
Glucose: 86 mg/dL (ref 70–99)
Potassium: 5 mmol/L (ref 3.5–5.2)
Sodium: 142 mmol/L (ref 134–144)
eGFR: 24 mL/min/{1.73_m2} — ABNORMAL LOW (ref >59)

## 2023-02-09 LAB — URINE CULTURE

## 2023-02-09 LAB — MAGNESIUM: Magnesium: 2.4 mg/dL — ABNORMAL HIGH (ref 1.6–2.3)

## 2023-02-09 MED ORDER — AMOXICILLIN-POT CLAVULANATE 875-125 MG PO TABS
1.0000 | ORAL_TABLET | Freq: Two times a day (BID) | ORAL | 0 refills | Status: DC
Start: 2023-02-09 — End: 2023-06-01

## 2023-02-09 NOTE — Telephone Encounter (Signed)
Patients daughter aware

## 2023-02-10 LAB — URINE CULTURE

## 2023-02-11 DIAGNOSIS — I959 Hypotension, unspecified: Secondary | ICD-10-CM | POA: Insufficient documentation

## 2023-02-11 DIAGNOSIS — N179 Acute kidney failure, unspecified: Secondary | ICD-10-CM | POA: Insufficient documentation

## 2023-02-11 LAB — URINE CULTURE: Culture: >=100000 — AB

## 2023-02-11 NOTE — Assessment & Plan Note (Signed)
Check hydration status , dehydrated with AKI, h/o poor oral intake , sent to ED for further evaluation

## 2023-02-11 NOTE — Assessment & Plan Note (Signed)
Ed eval and management , pt needs to increase water intake intentionally

## 2023-02-11 NOTE — Progress Notes (Signed)
   [name]     MRN: [id number]      DOB: [birth year]  Chief Complaint  Patient presents with   Follow-up    Daughter requesting to have urine tested again.    HPI [name] is here for follow up and re-evaluation of chronic medical conditions, medication management and review of any available recent lab and radiology data.  Preventive health is updated, specifically  Cancer screening and Immunization.   Daughter c/o decreased intake of water and is concerned that Mother has UTI again and wants urine checked Denies any fever or change in behavior or appetite  ROS Denies recent fever or chills. Denies sinus pressure, nasal congestion, ear pain or sore throat. Denies chest congestion, productive cough or wheezing. Denies chest pains, palpitations and leg swelling Denies abdominal pain, nausea, vomiting,diarrhea or constipation.   Chronic generalized joint stiffness and  limitation in mobility. Denies headaches, seizures, numbness, or tingling. Denies depression, anxiety or insomnia. Denies skin break down or rash.   PE  BP 90/60   Pulse (!) 111   Ht 5\' 6"  (1.676 m)   Wt 164 lb (74.4 kg)   SpO2 95%   BMI 26.47 kg/m   Patient alert and oriented blood pressure low  HEENT: No facial asymmetry, EOMI,     Neck decreased ROM.  Chest: clear  CVS: S1, S2 no murmurs, no S3.Regular rate.  ABD: Soft non tender.   Ext: No edema  MS: decreased  ROM spine, shoulders, hips and knees.  Skin: Intact, no ulcerations or rash noted.  Psych: Good eye contact, normal affect.  not anxious or depressed appearing.  CNS: CN 2-12 intact, power,  normal throughout.no focal deficits noted.   Assessment & Plan  Hypotension Check hydration status , dehydrated with AKI, h/o poor oral intake , sent to ED for further evaluation  Malodorous urine CCUA abnormal , send for c/S , hold nitrofurantoin and start augmentin until c/s available  AKI (acute kidney injury) (HCC) Ed eval and  management , pt needs to increase water intake intentionally

## 2023-02-11 NOTE — Assessment & Plan Note (Signed)
CCUA abnormal , send for c/S , hold nitrofurantoin and start augmentin until c/s available

## 2023-02-12 ENCOUNTER — Telehealth (HOSPITAL_BASED_OUTPATIENT_CLINIC_OR_DEPARTMENT_OTHER): Payer: Self-pay | Admitting: *Deleted

## 2023-02-12 NOTE — Telephone Encounter (Signed)
Post ED Visit - Positive Culture Follow-up  Culture report reviewed by antimicrobial stewardship pharmacist: [facility] [x]  Andreas Ohm, Pharm.D. []  Celedonio Miyamoto, Pharm.D., BCPS AQ-ID []  Garvin Fila, Pharm.D., BCPS []  Georgina Pillion, Pharm.D., BCPS []  [place], [address].D., BCPS, AAHIVP []  Estella Husk, Pharm.D., BCPS, AAHIVP []  Lysle Pearl, PharmD, BCPS []  Phillips Climes, PharmD, BCPS []  Agapito Games, PharmD, BCPS []  Verlan Friends, PharmD []  Mervyn Gay, PharmD, BCPS []  Vinnie Level, PharmD  [facility] []  Len Childs, PharmD []  Greer Pickerel, PharmD []  Adalberto Cole, PharmD []  Perlie Gold, Rph []  Lonell Face) Jean Rosenthal, PharmD []  Earl Many, PharmD []  Junita Push, PharmD []  Dorna Leitz, PharmD []  Terrilee Files, PharmD []  Lynann Beaver, PharmD []  Keturah Barre, PharmD []  Loralee Pacas, PharmD []  Bernadene Person, PharmD   Positive urine culture Prescribed Augmentin by PCP  and no further patient follow-up is required at this time.  Virl Axe [facility] 02/12/2023, 8:30 AM

## 2023-02-13 ENCOUNTER — Encounter: Payer: Self-pay | Admitting: Family Medicine

## 2023-02-13 NOTE — Progress Notes (Signed)
Remote pacemaker transmission.   

## 2023-02-14 ENCOUNTER — Telehealth: Payer: Self-pay

## 2023-02-14 NOTE — Telephone Encounter (Signed)
Transition Care Management Unsuccessful Follow-up Telephone Call  Date of discharge and from where:  02/08/2023 [facility]  Attempts:  1st Attempt  Reason for unsuccessful TCM follow-up call:  Left voice message  [name]  [facility] Population Health Community Resource Care Guide   [email or web address]  ?? [phone]   Website: [email or web address]  [email or web address]

## 2023-02-14 NOTE — Telephone Encounter (Signed)
Transition Care Management Follow-up Telephone Call Date of discharge and from where: 02/07/2023 [facility] How have you been since you were released from the hospital? Patient is feeling better Any questions or concerns? No  Items Reviewed: Did the pt receive and understand the discharge instructions provided? Yes  Medications obtained and verified? Yes  Other? No  Any new allergies since your discharge? No  Dietary orders reviewed? Yes Do you have support at home? Yes   Follow up appointments reviewed:  PCP Hospital f/u appt confirmed?  Daughter spoke with PCP over the phone 02/09/2023  Scheduled to see  on  @ . Specialist Hospital f/u appt confirmed? No  Scheduled to see  on  @ . Are transportation arrangements needed? No  If their condition worsens, is the pt aware to call PCP or go to the Emergency Dept.? Yes Was the patient provided with contact information for the PCP's office or ED? Yes Was to pt encouraged to call back with questions or concerns? Yes  Skylynne Schlechter [facility]  [facility] Population Health Community Resource Care Guide   [email or web address]  ?? [phone]   Website: [email or web address]  [email or web address]

## 2023-02-19 ENCOUNTER — Ambulatory Visit: Payer: Medicare Other | Attending: Cardiology | Admitting: *Deleted

## 2023-02-19 DIAGNOSIS — I480 Paroxysmal atrial fibrillation: Secondary | ICD-10-CM | POA: Diagnosis not present

## 2023-02-19 DIAGNOSIS — Z5181 Encounter for therapeutic drug level monitoring: Secondary | ICD-10-CM

## 2023-02-19 LAB — POCT INR: INR: 1.8 — AB (ref 2.0–3.0)

## 2023-02-19 NOTE — Patient Instructions (Signed)
Take warfarin 1 tablet tonight then resume 1/2 tablet daily. Recheck in 4 weeks Continue greens Taking Macrobid 1 tablet 3 x wk long term

## 2023-02-23 ENCOUNTER — Other Ambulatory Visit: Payer: Self-pay | Admitting: Cardiology

## 2023-02-23 ENCOUNTER — Other Ambulatory Visit: Payer: Self-pay | Admitting: Family Medicine

## 2023-02-23 DIAGNOSIS — I480 Paroxysmal atrial fibrillation: Secondary | ICD-10-CM

## 2023-02-23 NOTE — Telephone Encounter (Signed)
Prescription refill request received for warfarin Lov: 01/04/23 ([facility])  Next INR check: 03/19/23 Warfarin tablet strength: 2.5mg   Appropriate dose. Refill sent.

## 2023-02-28 ENCOUNTER — Other Ambulatory Visit: Payer: Self-pay | Admitting: Family Medicine

## 2023-03-03 DIAGNOSIS — R0902 Hypoxemia: Secondary | ICD-10-CM | POA: Diagnosis not present

## 2023-03-19 ENCOUNTER — Ambulatory Visit: Payer: Medicare Other | Attending: Cardiology | Admitting: *Deleted

## 2023-03-19 DIAGNOSIS — Z5181 Encounter for therapeutic drug level monitoring: Secondary | ICD-10-CM | POA: Diagnosis not present

## 2023-03-19 DIAGNOSIS — I480 Paroxysmal atrial fibrillation: Secondary | ICD-10-CM

## 2023-03-19 LAB — POCT INR: INR: 1.7 — AB (ref 2.0–3.0)

## 2023-03-19 NOTE — Patient Instructions (Signed)
Increase warfarin to 1/2 tablet daily except 1 tablet on Mondays Recheck in 3 weeks Continue greens Taking Macrobid 1 tablet 3 x wk long term

## 2023-03-22 ENCOUNTER — Other Ambulatory Visit: Payer: Self-pay | Admitting: Family Medicine

## 2023-03-26 ENCOUNTER — Other Ambulatory Visit: Payer: Self-pay | Admitting: Family Medicine

## 2023-03-28 ENCOUNTER — Other Ambulatory Visit: Payer: Self-pay | Admitting: Family Medicine

## 2023-03-28 ENCOUNTER — Encounter: Payer: Self-pay | Admitting: Family Medicine

## 2023-03-28 ENCOUNTER — Telehealth: Payer: Self-pay

## 2023-03-28 NOTE — Telephone Encounter (Signed)
error 

## 2023-03-30 ENCOUNTER — Other Ambulatory Visit: Payer: Self-pay | Admitting: Family Medicine

## 2023-03-30 MED ORDER — LEVOTHYROXINE SODIUM 112 MCG PO TABS: 112.0000 ug | ORAL_TABLET | Freq: Every day | ORAL | 0 refills | Status: DC

## 2023-04-03 DIAGNOSIS — R0902 Hypoxemia: Secondary | ICD-10-CM | POA: Diagnosis not present

## 2023-04-06 ENCOUNTER — Other Ambulatory Visit: Payer: Self-pay | Admitting: Cardiology

## 2023-04-06 DIAGNOSIS — I480 Paroxysmal atrial fibrillation: Secondary | ICD-10-CM

## 2023-04-06 DIAGNOSIS — R0902 Hypoxemia: Secondary | ICD-10-CM | POA: Diagnosis not present

## 2023-04-06 NOTE — Telephone Encounter (Signed)
Prescription refill request received for warfarin Lov: 01/04/23 ([facility])  Next INR check: 04/09/23 Warfarin tablet strength: 5mg   Appropriate dose. Refill sent.

## 2023-04-09 ENCOUNTER — Ambulatory Visit: Payer: Medicare Other | Attending: Cardiology | Admitting: *Deleted

## 2023-04-09 ENCOUNTER — Other Ambulatory Visit: Payer: Self-pay | Admitting: Family Medicine

## 2023-04-09 DIAGNOSIS — I480 Paroxysmal atrial fibrillation: Secondary | ICD-10-CM | POA: Diagnosis not present

## 2023-04-09 DIAGNOSIS — Z5181 Encounter for therapeutic drug level monitoring: Secondary | ICD-10-CM | POA: Diagnosis not present

## 2023-04-09 LAB — POCT INR: INR: 1.9 — AB (ref 2.0–3.0)

## 2023-04-09 MED ORDER — BUSPIRONE HCL 7.5 MG PO TABS: ORAL_TABLET | ORAL | 0 refills | Status: DC

## 2023-04-09 NOTE — Patient Instructions (Signed)
Increase warfarin to 1/2 tablet daily except 1 tablet on Mondays and Thursdays Recheck in 3 weeks Continue greens Taking Macrobid 1 tablet 3 x wk long term

## 2023-04-26 ENCOUNTER — Ambulatory Visit (INDEPENDENT_AMBULATORY_CARE_PROVIDER_SITE_OTHER): Payer: Medicare Other

## 2023-04-26 DIAGNOSIS — I495 Sick sinus syndrome: Secondary | ICD-10-CM | POA: Diagnosis not present

## 2023-04-26 LAB — CUP PACEART REMOTE DEVICE CHECK
Battery Remaining Longevity: 54 mo
Battery Remaining Percentage: 48 %
Battery Voltage: 2.98 V
Brady Statistic RV Percent Paced: 64 %
Date Time Interrogation Session: 20240829064510
Implantable Lead Connection Status: 753985
Implantable Lead Connection Status: 753985
Implantable Lead Implant Date: 20110422
Implantable Lead Implant Date: 20110422
Implantable Lead Location: 753859
Implantable Lead Location: 753860
Implantable Pulse Generator Implant Date: 20180319
Lead Channel Impedance Value: 380 Ohm
Lead Channel Pacing Threshold Amplitude: 1 V
Lead Channel Pacing Threshold Pulse Width: 0.5 ms
Lead Channel Sensing Intrinsic Amplitude: 6.1 mV
Lead Channel Setting Pacing Amplitude: 2 V
Lead Channel Setting Pacing Pulse Width: 0.5 ms
Lead Channel Setting Sensing Sensitivity: 1 mV
Pulse Gen Model: 2272
Pulse Gen Serial Number: 8010902

## 2023-05-01 ENCOUNTER — Ambulatory Visit (INDEPENDENT_AMBULATORY_CARE_PROVIDER_SITE_OTHER): Payer: Medicare Other

## 2023-05-01 ENCOUNTER — Ambulatory Visit: Payer: Medicare Other | Attending: Cardiology | Admitting: *Deleted

## 2023-05-01 DIAGNOSIS — I480 Paroxysmal atrial fibrillation: Secondary | ICD-10-CM

## 2023-05-01 DIAGNOSIS — Z5181 Encounter for therapeutic drug level monitoring: Secondary | ICD-10-CM

## 2023-05-01 DIAGNOSIS — I495 Sick sinus syndrome: Secondary | ICD-10-CM

## 2023-05-01 LAB — POCT INR: INR: 2.3 (ref 2.0–3.0)

## 2023-05-01 NOTE — Patient Instructions (Signed)
Continue warfarin 1/2 tablet daily except 1 tablet on Mondays and Thursdays Recheck in 4 weeks Continue greens Taking Macrobid 1 tablet 3 x wk long term

## 2023-05-03 LAB — CUP PACEART REMOTE DEVICE CHECK
Battery Remaining Longevity: 55 mo
Battery Remaining Percentage: 48 %
Battery Voltage: 2.98 V
Brady Statistic RV Percent Paced: 64 %
Date Time Interrogation Session: 20240904120157
Implantable Lead Connection Status: 753985
Implantable Lead Connection Status: 753985
Implantable Lead Implant Date: 20110422
Implantable Lead Implant Date: 20110422
Implantable Lead Location: 753859
Implantable Lead Location: 753860
Implantable Pulse Generator Implant Date: 20180319
Lead Channel Impedance Value: 400 Ohm
Lead Channel Pacing Threshold Amplitude: 1 V
Lead Channel Pacing Threshold Pulse Width: 0.5 ms
Lead Channel Sensing Intrinsic Amplitude: 5.8 mV
Lead Channel Setting Pacing Amplitude: 2 V
Lead Channel Setting Pacing Pulse Width: 0.5 ms
Lead Channel Setting Sensing Sensitivity: 1 mV
Pulse Gen Model: 2272
Pulse Gen Serial Number: 8010902

## 2023-05-03 NOTE — Progress Notes (Signed)
Remote pacemaker transmission.   

## 2023-05-10 NOTE — Progress Notes (Signed)
Remote pacemaker transmission.   

## 2023-05-11 ENCOUNTER — Encounter: Payer: Medicare Other | Admitting: Family Medicine

## 2023-05-12 ENCOUNTER — Other Ambulatory Visit: Payer: Self-pay | Admitting: Family Medicine

## 2023-05-14 ENCOUNTER — Other Ambulatory Visit: Payer: Self-pay

## 2023-05-14 MED ORDER — BUSPIRONE HCL 7.5 MG PO TABS: ORAL_TABLET | ORAL | 0 refills | Status: DC

## 2023-05-14 MED ORDER — TORSEMIDE 20 MG PO TABS
20.0000 mg | ORAL_TABLET | Freq: Every day | ORAL | 0 refills | Status: DC
  Filled 2023-05-14: qty 90, 90d supply, fill #0

## 2023-05-19 ENCOUNTER — Encounter: Payer: Self-pay | Admitting: Cardiology

## 2023-05-21 ENCOUNTER — Other Ambulatory Visit: Payer: Self-pay

## 2023-05-21 ENCOUNTER — Other Ambulatory Visit: Payer: Self-pay | Admitting: Family Medicine

## 2023-05-21 MED ORDER — DONEPEZIL HCL 10 MG PO TABS: 10.0000 mg | ORAL_TABLET | Freq: Every day | ORAL | 0 refills | Status: DC

## 2023-05-21 MED ORDER — TORSEMIDE 20 MG PO TABS: 20.0000 mg | ORAL_TABLET | Freq: Every day | ORAL | 0 refills | Status: DC

## 2023-05-26 ENCOUNTER — Other Ambulatory Visit: Payer: Self-pay | Admitting: Cardiology

## 2023-05-26 ENCOUNTER — Other Ambulatory Visit: Payer: Self-pay | Admitting: Family Medicine

## 2023-05-29 ENCOUNTER — Ambulatory Visit: Payer: Medicare Other | Attending: Cardiology | Admitting: *Deleted

## 2023-05-29 DIAGNOSIS — Z5181 Encounter for therapeutic drug level monitoring: Secondary | ICD-10-CM | POA: Diagnosis not present

## 2023-05-29 DIAGNOSIS — I480 Paroxysmal atrial fibrillation: Secondary | ICD-10-CM | POA: Diagnosis not present

## 2023-05-29 LAB — POCT INR: INR: 3.2 — AB (ref 2.0–3.0)

## 2023-05-29 NOTE — Patient Instructions (Signed)
Hold warfarin tonight then resume 1/2 tablet daily except 1 tablet on Mondays and Thursdays Recheck in 3 weeks Increase greens Taking Macrobid 1 tablet 3 x wk long term

## 2023-05-30 DIAGNOSIS — I10 Essential (primary) hypertension: Secondary | ICD-10-CM | POA: Diagnosis not present

## 2023-05-30 DIAGNOSIS — E038 Other specified hypothyroidism: Secondary | ICD-10-CM | POA: Diagnosis not present

## 2023-05-30 DIAGNOSIS — E559 Vitamin D deficiency, unspecified: Secondary | ICD-10-CM | POA: Diagnosis not present

## 2023-05-30 DIAGNOSIS — E7849 Other hyperlipidemia: Secondary | ICD-10-CM | POA: Diagnosis not present

## 2023-05-31 LAB — CMP14+EGFR
ALT: 8 [IU]/L (ref 0–32)
AST: 15 [IU]/L (ref 0–40)
Albumin: 4 g/dL (ref 3.6–4.6)
Alkaline Phosphatase: 112 [IU]/L (ref 44–121)
BUN/Creatinine Ratio: 19 (ref 12–28)
BUN: 31 mg/dL (ref 10–36)
Bilirubin Total: 0.4 mg/dL (ref 0.0–1.2)
CO2: 23 mmol/L (ref 20–29)
Calcium: 9.3 mg/dL (ref 8.7–10.3)
Chloride: 101 mmol/L (ref 96–106)
Creatinine, Ser: 1.67 mg/dL — ABNORMAL HIGH (ref 0.57–1.00)
Globulin, Total: 2.6 g/dL (ref 1.5–4.5)
Glucose: 108 mg/dL — ABNORMAL HIGH (ref 70–99)
Potassium: 4.5 mmol/L (ref 3.5–5.2)
Sodium: 140 mmol/L (ref 134–144)
Total Protein: 6.6 g/dL (ref 6.0–8.5)
eGFR: 28 mL/min/{1.73_m2} — ABNORMAL LOW (ref >59)

## 2023-05-31 LAB — LIPID PANEL
Chol/HDL Ratio: 2.4 {ratio} (ref 0.0–4.4)
Cholesterol, Total: 131 mg/dL (ref 100–199)
HDL: 55 mg/dL (ref >39)
LDL Chol Calc (NIH): 53 mg/dL (ref 0–99)
Triglycerides: 132 mg/dL (ref 0–149)
VLDL Cholesterol Cal: 23 mg/dL (ref 5–40)

## 2023-05-31 LAB — VITAMIN D 25 HYDROXY (VIT D DEFICIENCY, FRACTURES): Vit D, 25-Hydroxy: 78.7 ng/mL (ref 30.0–100.0)

## 2023-05-31 LAB — TSH: TSH: 1.01 u[IU]/mL (ref 0.450–4.500)

## 2023-06-01 ENCOUNTER — Ambulatory Visit (INDEPENDENT_AMBULATORY_CARE_PROVIDER_SITE_OTHER): Payer: Medicare Other | Admitting: Family Medicine

## 2023-06-01 ENCOUNTER — Encounter: Payer: Self-pay | Admitting: Family Medicine

## 2023-06-01 VITALS — BP 112/66 | HR 96 | Ht 66.0 in | Wt 165.8 lb

## 2023-06-01 DIAGNOSIS — E7849 Other hyperlipidemia: Secondary | ICD-10-CM

## 2023-06-01 DIAGNOSIS — E559 Vitamin D deficiency, unspecified: Secondary | ICD-10-CM | POA: Diagnosis not present

## 2023-06-01 DIAGNOSIS — R300 Dysuria: Secondary | ICD-10-CM | POA: Diagnosis not present

## 2023-06-01 DIAGNOSIS — I10 Essential (primary) hypertension: Secondary | ICD-10-CM | POA: Diagnosis not present

## 2023-06-01 DIAGNOSIS — B369 Superficial mycosis, unspecified: Secondary | ICD-10-CM

## 2023-06-01 DIAGNOSIS — E038 Other specified hypothyroidism: Secondary | ICD-10-CM | POA: Diagnosis not present

## 2023-06-01 DIAGNOSIS — Z0001 Encounter for general adult medical examination with abnormal findings: Secondary | ICD-10-CM | POA: Diagnosis not present

## 2023-06-01 DIAGNOSIS — Z23 Encounter for immunization: Secondary | ICD-10-CM

## 2023-06-01 MED ORDER — AZELASTINE HCL 0.1 % NA SOLN: 2.0000 | Freq: Two times a day (BID) | NASAL | 5 refills | Status: DC

## 2023-06-01 MED ORDER — CLOTRIMAZOLE-BETAMETHASONE 1-0.05 % EX CREA: TOPICAL_CREAM | CUTANEOUS | 0 refills | Status: DC

## 2023-06-01 NOTE — Patient Instructions (Addendum)
F/U end Feb , call if you need me sooner   Flu vaccine today   You do need the RSV vaccine which is at your pharmacy  I will directly message  you re the Pneumonia vaccine after additional research   CCUA and reflex c/s to be returned  New additional nose spray for uncontrolled allergies, continue what medications you are taking  Cream sent in for rash on neck to use as needed  Fasting CBC, magnesium, lipid,cmp and eGFr and tSH , 3 to 4 days before next appointment  Happy thanksgiving, Merry Christmas, Best for 2025,  Happy Birthday!!    Keep active as able, no falls,and keep up your GREAT humor

## 2023-06-01 NOTE — Progress Notes (Unsigned)
[name]     MRN: [id number]      DOB: February 06, 1926  Chief Complaint  Patient presents with   Annual Exam    CPE    HPI: Patient is in for annual physical exam. Requests that urine be checked for uTI, sh is maintained on prophylactic nitrofurantoin C/o  continuos clear nasal drainage, nmo fevr or chills or productive cough C/o itchy rash on neck sporadiacally Immunization is reviewed ,needs RSV and wants to know about the pneumonia vaccine, last was over 10 years ago  PE: BP 112/66 (BP Location: Right Arm, Patient Position: Sitting, Cuff Size: Large)   Pulse 96   Ht 5\' 6"  (1.676 m)   Wt 165 lb 12.8 oz (75.2 kg)   SpO2 96%   BMI 26.76 kg/m   Pleasant  female, alert and oriented x 3, in no cardio-pulmonary distress. Afebrile. HEENT No facial trauma or asymetry. Sinuses non tender.  Extra occullar muscles intact.. External ears normal, . Neck: decreased ROM, no adenopathy,JVD or thyromegaly.No bruits.  Chest: Clear to ascultation bilaterally.Decreased air entry, No crackles or wheezes. Non tender to palpation  Breast: No complaints, not examined  Cardiovascular system; Heart sounds normal,  S1 and  S2 ,no S3.  No murmur, or thrill. Peripheral pulses normal.  Abdomen: Soft, non tender,    Musculoskeletal exam: Decreased ROM of spine, hips , shoulders and knees.  deformity ,swelling or crepitus noted. Mild  muscle wasting no  atrophy.   Neurologic: Cranial nerves 2 to 12 intact.Significant hearing loss Power, tone ,sensation  normal throughout.  disturbance in gait. No tremor.Uses walker for assistance  Skin: Intact, no ulceration, erythema , scaling  rash noted.on left neck Pigmentation normal throughout  Psych; Normal mood and affect. Judgement and concentration normal   Assessment & Plan:  Encounter for Medicare annual examination with abnormal findings Annual exam as documented.  Immunization and cancer screening needs are specifically  addressed at this visit.   Dysuria Recurrent , CCUA and c/s since abn  Dermatomycosis Clotrimazole/betameth prescribed

## 2023-06-03 ENCOUNTER — Encounter: Payer: Self-pay | Admitting: Family Medicine

## 2023-06-03 DIAGNOSIS — B369 Superficial mycosis, unspecified: Secondary | ICD-10-CM | POA: Insufficient documentation

## 2023-06-03 DIAGNOSIS — R300 Dysuria: Secondary | ICD-10-CM | POA: Insufficient documentation

## 2023-06-03 NOTE — Assessment & Plan Note (Signed)
Recurrent , CCUA and c/s since abn

## 2023-06-03 NOTE — Assessment & Plan Note (Signed)
Annual exam as documented. . Immunization and cancer screening needs are specifically addressed at this visit.  

## 2023-06-03 NOTE — Assessment & Plan Note (Signed)
Clotrimazole/betameth prescribed 

## 2023-06-04 LAB — URINALYSIS
Bilirubin, UA: NEGATIVE
Glucose, UA: NEGATIVE
Ketones, UA: NEGATIVE
Nitrite, UA: NEGATIVE
Protein,UA: NEGATIVE
Specific Gravity, UA: 1.02 (ref 1.005–1.030)
Urobilinogen, Ur: 0.2 mg/dL (ref 0.2–1.0)
pH, UA: 5 (ref 5.0–7.5)

## 2023-06-06 LAB — URINE CULTURE

## 2023-06-07 MED ORDER — AMPICILLIN 500 MG PO CAPS
500.0000 mg | ORAL_CAPSULE | Freq: Three times a day (TID) | ORAL | 0 refills | Status: DC
Start: 2023-06-07 — End: 2023-07-11

## 2023-06-07 NOTE — Addendum Note (Signed)
Addended by: Syliva Overman E on: 06/07/2023 01:12 PM   Modules accepted: Orders

## 2023-06-08 ENCOUNTER — Other Ambulatory Visit: Payer: Self-pay | Admitting: Family Medicine

## 2023-06-08 MED ORDER — OMEPRAZOLE 40 MG PO CPDR: DELAYED_RELEASE_CAPSULE | ORAL | 0 refills | Status: DC

## 2023-06-17 ENCOUNTER — Other Ambulatory Visit: Payer: Self-pay | Admitting: Family Medicine

## 2023-06-18 MED ORDER — BUSPIRONE HCL 7.5 MG PO TABS: ORAL_TABLET | ORAL | 0 refills | Status: DC

## 2023-06-19 ENCOUNTER — Other Ambulatory Visit: Payer: Self-pay

## 2023-06-19 MED ORDER — BUSPIRONE HCL 7.5 MG PO TABS: ORAL_TABLET | ORAL | 0 refills | Status: DC

## 2023-06-20 ENCOUNTER — Ambulatory Visit: Payer: Medicare Other | Attending: Cardiology | Admitting: *Deleted

## 2023-06-20 DIAGNOSIS — Z5181 Encounter for therapeutic drug level monitoring: Secondary | ICD-10-CM | POA: Diagnosis not present

## 2023-06-20 DIAGNOSIS — I480 Paroxysmal atrial fibrillation: Secondary | ICD-10-CM | POA: Diagnosis not present

## 2023-06-20 LAB — POCT INR: INR: 2.8 (ref 2.0–3.0)

## 2023-06-20 MED ORDER — METOPROLOL TARTRATE 25 MG PO TABS: 25.0000 mg | ORAL_TABLET | Freq: Two times a day (BID) | ORAL | 3 refills | Status: DC

## 2023-06-20 NOTE — Patient Instructions (Signed)
Continue warfarin 1/2 tablet daily except 1 tablet on Mondays and Thursdays Recheck in 4 weeks Continue greens Taking Macrobid 1 tablet 3 x wk long term

## 2023-06-23 ENCOUNTER — Other Ambulatory Visit: Payer: Self-pay | Admitting: Cardiology

## 2023-06-23 DIAGNOSIS — I480 Paroxysmal atrial fibrillation: Secondary | ICD-10-CM

## 2023-06-26 ENCOUNTER — Other Ambulatory Visit: Payer: Self-pay | Admitting: Family Medicine

## 2023-06-27 MED ORDER — LEVOTHYROXINE SODIUM 112 MCG PO TABS: 112.0000 ug | ORAL_TABLET | Freq: Every day | ORAL | 0 refills | Status: DC

## 2023-07-02 ENCOUNTER — Other Ambulatory Visit: Payer: Self-pay | Admitting: Cardiology

## 2023-07-11 ENCOUNTER — Ambulatory Visit: Payer: Medicare Other | Attending: Cardiology | Admitting: Cardiology

## 2023-07-11 ENCOUNTER — Encounter: Payer: Self-pay | Admitting: Cardiology

## 2023-07-11 VITALS — BP 122/62 | HR 85 | Ht 66.0 in | Wt 164.0 lb

## 2023-07-11 DIAGNOSIS — I10 Essential (primary) hypertension: Secondary | ICD-10-CM

## 2023-07-11 DIAGNOSIS — I480 Paroxysmal atrial fibrillation: Secondary | ICD-10-CM

## 2023-07-11 DIAGNOSIS — Z950 Presence of cardiac pacemaker: Secondary | ICD-10-CM

## 2023-07-11 DIAGNOSIS — R600 Localized edema: Secondary | ICD-10-CM

## 2023-07-11 NOTE — Patient Instructions (Signed)

## 2023-07-11 NOTE — Progress Notes (Signed)
Clinical Summary [name] is a 89 y.o.female seen today for follow up of the following medical problems     1. Paroxysmal afib - Has not been interested in NOACs. Has been on coumadin     - no recent palpitations - compliant with meds. No bleeding on coumadin       2. Symptomatic bradycardia with pacemaker - followed by EP   04/2023 normal device check.    3. HTN - she is compliant with meds       4. Hyperlipidemia  - she is on lovstatin   02/2021 TC 143 TG 134 HDL 60 LDL 60 Jan 2023 TC 409 TG 811 HDL 59 LDL 60 03/2022 TC 138 TG 123 HDL 56 LDL 60  - 09/2022 TC 151 TG 152 HDL 58 LDL 67  05/2023 TC 914 TG 782 HDL 55 LDL 53    5. Edema - 06/24/20 labs Cr 1.2 BUN 26 BNP 2779  - 06/2020 echo LVEF 55-60%, indet DDx  - swelling is up and down, though primarily controled - taking toresmide 40mg  alteranting days with 20mg  - home weights 159-161 lbs which is stable over time   Sep 07 2021 lowered torsemide from 40mg  alternating days with 20mg  to 20mg  daily. Cr had elevated to 1.91, with change down to 1.49 which was prior baseline.      - mild edema at times, taking torsemide 20mg  daily. Will take additional as needed - home weights usually 163-165    - swelling overall ok, home weights 163-167 lbs     SH: daughter of [name] who is also a patient of mine. She has a great grandsone here with her today started kindergarten, a great grandaughter who is 65 yos Past Medical History:  Diagnosis Date   Allergy    Annual physical exam 04/22/2015   Anxiety    Arthritis    Arthritis    Asthma    Atrial fibrillation (HCC)    Bleeding disorder (HCC)    CHF (congestive heart failure) (HCC)    CKD (chronic kidney disease) stage 4, GFR 15-29 ml/min (HCC) 09/08/2021   COPD (chronic obstructive pulmonary disease) (HCC)    Depression    Gastroesophageal reflux disease    Hearing impairment    Heart disease    Heart murmur    High cholesterol    Hyperlipidemia     Hypertension    Hypothyroidism    Impaired glucose tolerance    Macular degeneration    Oxygen deficiency    Pancreatitis    Paroxysmal atrial fibrillation (HCC) 2011   Sick sinus syndrome (HCC) 2011   Atrial fibrilation 10/2009; and dual chamber pacemaker in 4/11; normal EF   Sleep apnea    Nocturnal oxygen therapy   Zoster 2016     No Known Allergies   Current Outpatient Medications  Medication Sig Dispense Refill   ampicillin (PRINCIPEN) 500 MG capsule Take 1 capsule (500 mg total) by mouth 3 (three) times daily. 15 capsule 0   Ascorbic Acid (VITAMIN C PO) Take 1 tablet by mouth daily.     azelastine (ASTELIN) 0.1 % nasal spray Place 2 sprays into both nostrils 2 (two) times daily. Use in each nostril as directed 30 mL 5   busPIRone (BUSPAR) 7.5 MG tablet TAKE (1) TABLET BY MOUTH TWICE DAILY. 60 tablet 0   Cholecalciferol (VITAMIN D3 PO) Take 2 tablets by mouth daily.     clobetasol cream (TEMOVATE) 0.05 % Apply  1 Application topically 2 (two) times daily. Apply sparingly twice daily to rash on neck for 1 week, then as needed 45 g 0   clotrimazole-betamethasone (LOTRISONE) cream Apply to affected area(s) twice daily as needed, for 5 to 7 days 45 g 0   Cyanocobalamin (VITAMIN B12 PO) Take 1 tablet by mouth daily.     diltiazem (CARDIZEM CD) 300 MG 24 hr capsule TAKE ONE CAPSULE BY MOUTH ONCE DAILY. 90 capsule 3   donepezil (ARICEPT) 10 MG tablet Take 1 tablet (10 mg total) by mouth at bedtime. 90 tablet 0   fluticasone (FLONASE) 50 MCG/ACT nasal spray Place 1 spray into both nostrils daily.     levothyroxine (SYNTHROID) 112 MCG tablet Take 1 tablet (112 mcg total) by mouth daily. 90 tablet 0   loratadine (CLARITIN) 10 MG tablet Take 1 tablet (10 mg total) by mouth daily. 30 tablet 1   lovastatin (MEVACOR) 40 MG tablet TAKE ONE TABLET BY MOUTH AT BEDTIME. 90 tablet 2   meclizine (ANTIVERT) 12.5 MG tablet Take 1 tablet (12.5 mg total) by mouth 2 (two) times daily as needed for  dizziness. 30 tablet 0   metoprolol tartrate (LOPRESSOR) 100 MG tablet TAKE 1 TABLET BY MOUTH TWICE A DAY. 180 tablet 0   metoprolol tartrate (LOPRESSOR) 25 MG tablet Take 1 tablet (25 mg total) by mouth 2 (two) times daily. 180 tablet 3   Multiple Vitamins-Minerals (EYE VITAMINS & MINERALS PO) Take 1 tablet by mouth daily.     nitrofurantoin, macrocrystal-monohydrate, (MACROBID) 100 MG capsule TAKE 1 CAPSULE BY MOUTH EVERY MONDAY, WEDNESDAY AND FRIDAY 12 capsule 5   omeprazole (PRILOSEC) 40 MG capsule TAKE (1) CAPSULE BY MOUTH ONCE DAILY FOR ACID REFLUX. 90 capsule 0   omeprazole (PRILOSEC) 40 MG capsule TAKE (1) CAPSULE BY MOUTH ONCE DAILY FOR ACID REFLUX. 90 capsule 0   OXYGEN-HELIUM IN Inhale 2 L into the lungs at bedtime.     torsemide (DEMADEX) 20 MG tablet Take 1 tablet (20 mg total) by mouth daily. 90 tablet 0   warfarin (COUMADIN) 5 MG tablet TAKE 1/2 TABLET TO 1 TABLET BY MOUTH DAILY OR AS DIRECTED BY COUMADIN CLINIC 30 tablet 0   zinc gluconate 50 MG tablet Take 50 mg by mouth daily.     Current Facility-Administered Medications  Medication Dose Route Frequency Provider Last Rate Last Admin   miconazole (MONISTAT 7) 2 % vaginal cream 1 Applicatorful  1 Applicatorful Vaginal QHS Tilda Burrow, MD         Past Surgical History:  Procedure Laterality Date   ABDOMINAL HYSTERECTOMY     CATARACT EXTRACTION     Bilateral   COLONOSCOPY  08/29/2007   Dr. Jena Gauss   EYE SURGERY     laser    PACEMAKER INSERTION  08/28/2009   dual chamber    PPM GENERATOR CHANGEOUT N/A 11/13/2016   Procedure: PPM Generator Changeout;  Surgeon: Marinus Maw, MD;  Location: [facility];  Service: Cardiovascular;  Laterality: N/A;   SALPINGOOPHORECTOMY  08/28/1968   right     No Known Allergies    Family History  Problem Relation Age of Onset   Cancer Mother        gential   Cancer Father        throat    Pneumonia Sister    Emphysema Sister    Mitral valve prolapse Sister     Heart disease Other    Arthritis Other    Lung  disease Other    Asthma Other    Melanoma Son    Anxiety disorder Daughter    Arthritis Daughter    Asthma Daughter    COPD Daughter    Depression Daughter    Kidney disease Daughter    Obesity Daughter    Anxiety disorder Daughter    Anxiety disorder Daughter    Arthritis Daughter      Social History Ms. Bozzo reports that she has never smoked. She has never been exposed to tobacco smoke. She has never used smokeless tobacco. Ms. Felkins reports no history of alcohol use.   Review of Systems CONSTITUTIONAL: No weight loss, fever, chills, weakness or fatigue.  HEENT: Eyes: No visual loss, blurred vision, double vision or yellow sclerae.No hearing loss, sneezing, congestion, runny nose or sore throat.  SKIN: No rash or itching.  CARDIOVASCULAR: per hpi RESPIRATORY: No shortness of breath, cough or sputum.  GASTROINTESTINAL: No anorexia, nausea, vomiting or diarrhea. No abdominal pain or blood.  GENITOURINARY: No burning on urination, no polyuria NEUROLOGICAL: No headache, dizziness, syncope, paralysis, ataxia, numbness or tingling in the extremities. No change in bowel or bladder control.  MUSCULOSKELETAL: No muscle, back pain, joint pain or stiffness.  LYMPHATICS: No enlarged nodes. No history of splenectomy.  PSYCHIATRIC: No history of depression or anxiety.  ENDOCRINOLOGIC: No reports of sweating, cold or heat intolerance. No polyuria or polydipsia.  Marland Kitchen   Physical Examination Today's Vitals   07/11/23 1011  BP: 122/62  Pulse: 85  SpO2: 92%  Weight: 164 lb (74.4 kg)  Height: 5\' 6"  (1.676 m)   Body mass index is 26.47 kg/m.  Gen: resting comfortably, no acute distress HEENT: no scleral icterus, pupils equal round and reactive, no palptable cervical adenopathy,  CV: irreg, no m/rg, no jvd Resp: Clear to auscultation bilaterally GI: abdomen is soft, non-tender, non-distended, normal bowel sounds, no  hepatosplenomegaly MSK: extremities are warm, trace bilateral edema Skin: warm, no rash Neuro:  no focal deficits Psych: appropriate affect   Diagnostic Studies 07/04/19 echo IMPRESSIONS      1. Left ventricular ejection fraction, by visual estimation, is 65 to 70%. The left ventricle has normal function. There is mildly increased left ventricular hypertrophy.  2. Left ventricular diastolic parameters are indeterminate in the setting of atrial fibrillation.  3. Global right ventricle has normal systolic function.The right ventricular size is mildly enlarged. No increase in right ventricular wall thickness.  4. Left atrial size was upper normal.  5. Right atrial size was upper normal.  6. Mild mitral annular calcification.  7. Moderate aortic valve annular calcification.  8. The mitral valve is grossly normal. Mild mitral valve regurgitation.  9. The tricuspid valve is grossly normal. Tricuspid valve regurgitation is trivial. 10. The aortic valve is tricuspid. Aortic valve regurgitation is not visualized. Mild aortic valve sclerosis without stenosis. 11. The pulmonic valve was grossly normal. Pulmonic valve regurgitation is trivial. 12. A pacer wire is visualized in the RV and RA. 13. The inferior vena cava is normal in size with greater than 50% respiratory variability, suggesting right atrial pressure of 3 mmHg. 14. Mildly elevated pulmonary artery systolic pressure. 15. The tricuspid regurgitant velocity is 2.71 m/s, and with an assumed right atrial pressure of 3 mmHg, the estimated right ventricular systolic pressure is mildly elevated at 32.4 mmHg.        Assessment and Plan   1. Afib/acquired thrombophilia - not intersted in NOACs, she will continue coumadin - no symptoms, conitnue current meds  EKG shows rate controlled afib, V pacing   2. LE edema -AKI when taking torsemide 40mg  alternating days with 20mg  that has resolved on lower dose. Has done well on torsemide 20mg   daily. - we will continue torsemide 20mg  daily   3. Hyperlipidemia - she is at goal, continue current meds   4 HTN -bp is at goal, continue current meds   5. Pacemaker - recent normal device check  - no symptoms - has EP appt next week, continue to monitor      Antoine Poche, M.D.

## 2023-07-18 ENCOUNTER — Ambulatory Visit: Payer: Medicare Other | Attending: Cardiology

## 2023-07-18 DIAGNOSIS — I480 Paroxysmal atrial fibrillation: Secondary | ICD-10-CM | POA: Diagnosis not present

## 2023-07-18 DIAGNOSIS — Z5181 Encounter for therapeutic drug level monitoring: Secondary | ICD-10-CM | POA: Diagnosis not present

## 2023-07-18 LAB — POCT INR: INR: 3.8 — AB (ref 2.0–3.0)

## 2023-07-18 NOTE — Patient Instructions (Signed)
Description   Skip today's dosage of Warfarin, then resume same dosage 1/2 tablet daily except 1 tablet on Mondays and Thursdays Recheck in 2 weeks Continue greens Taking Macrobid 1 tablet 3 x wk long term

## 2023-07-19 ENCOUNTER — Other Ambulatory Visit: Payer: Self-pay | Admitting: Family Medicine

## 2023-07-19 ENCOUNTER — Ambulatory Visit: Payer: Medicare Other | Attending: Internal Medicine | Admitting: Internal Medicine

## 2023-07-19 VITALS — BP 128/62 | HR 74 | Ht 66.0 in | Wt 165.0 lb

## 2023-07-19 DIAGNOSIS — I482 Chronic atrial fibrillation, unspecified: Secondary | ICD-10-CM | POA: Diagnosis not present

## 2023-07-19 DIAGNOSIS — I495 Sick sinus syndrome: Secondary | ICD-10-CM

## 2023-07-19 LAB — CUP PACEART INCLINIC DEVICE CHECK
Battery Remaining Longevity: 52 mo
Battery Voltage: 2.98 V
Brady Statistic RA Percent Paced: 0 %
Brady Statistic RV Percent Paced: 63 %
Date Time Interrogation Session: 20241121134451
Implantable Lead Connection Status: 753985
Implantable Lead Connection Status: 753985
Implantable Lead Implant Date: 20110422
Implantable Lead Implant Date: 20110422
Implantable Lead Location: 753859
Implantable Lead Location: 753860
Implantable Pulse Generator Implant Date: 20180319
Lead Channel Impedance Value: 400 Ohm
Lead Channel Pacing Threshold Amplitude: 1 V
Lead Channel Pacing Threshold Amplitude: 1 V
Lead Channel Pacing Threshold Pulse Width: 0.5 ms
Lead Channel Pacing Threshold Pulse Width: 0.5 ms
Lead Channel Sensing Intrinsic Amplitude: 5.5 mV
Lead Channel Setting Pacing Amplitude: 2 V
Lead Channel Setting Pacing Pulse Width: 0.5 ms
Lead Channel Setting Sensing Sensitivity: 1 mV
Pulse Gen Model: 2272
Pulse Gen Serial Number: 8010902

## 2023-07-19 MED ORDER — BUSPIRONE HCL 7.5 MG PO TABS: ORAL_TABLET | ORAL | 0 refills | Status: DC

## 2023-07-19 NOTE — Patient Instructions (Signed)

## 2023-07-19 NOTE — Progress Notes (Signed)
HPI [name] returns today for followup. She is a pleasant 89 yo woman with chronic atrial fib, diastolic heart failure, s/p brady, s/p PPM insertion. She has not fallen. She denies syncope or loss of consciousness. She denies chest pain or sob. She admits to being sedentary. She lives with her daughter who is with her today and notes she was in the hospital in May with dehydration and a UTI. She has done well since.  No Known Allergies   Current Outpatient Medications  Medication Sig Dispense Refill   Ascorbic Acid (VITAMIN C PO) Take 1 tablet by mouth daily.     azelastine (ASTELIN) 0.1 % nasal spray Place 2 sprays into both nostrils 2 (two) times daily. Use in each nostril as directed 30 mL 5   busPIRone (BUSPAR) 7.5 MG tablet TAKE (1) TABLET BY MOUTH TWICE DAILY. 60 tablet 0   Cholecalciferol (VITAMIN D3 PO) Take 2 tablets by mouth daily.     clobetasol cream (TEMOVATE) 0.05 % Apply 1 Application topically 2 (two) times daily. Apply sparingly twice daily to rash on neck for 1 week, then as needed 45 g 0   clotrimazole-betamethasone (LOTRISONE) cream Apply to affected area(s) twice daily as needed, for 5 to 7 days 45 g 0   Cyanocobalamin (VITAMIN B12 PO) Take 1 tablet by mouth daily.     diltiazem (CARDIZEM CD) 300 MG 24 hr capsule TAKE ONE CAPSULE BY MOUTH ONCE DAILY. 90 capsule 3   donepezil (ARICEPT) 10 MG tablet Take 1 tablet (10 mg total) by mouth at bedtime. 90 tablet 0   fluticasone (FLONASE) 50 MCG/ACT nasal spray Place 1 spray into both nostrils daily.     levothyroxine (SYNTHROID) 112 MCG tablet Take 1 tablet (112 mcg total) by mouth daily. 90 tablet 0   loratadine (CLARITIN) 10 MG tablet Take 1 tablet (10 mg total) by mouth daily. 30 tablet 1   lovastatin (MEVACOR) 40 MG tablet TAKE ONE TABLET BY MOUTH AT BEDTIME. 90 tablet 2   meclizine (ANTIVERT) 12.5 MG tablet Take 1 tablet (12.5 mg total) by mouth 2 (two) times daily as needed for dizziness. 30 tablet 0   metoprolol  tartrate (LOPRESSOR) 100 MG tablet TAKE 1 TABLET BY MOUTH TWICE A DAY. 180 tablet 0   metoprolol tartrate (LOPRESSOR) 25 MG tablet Take 1 tablet (25 mg total) by mouth 2 (two) times daily. 180 tablet 3   Multiple Vitamins-Minerals (EYE VITAMINS & MINERALS PO) Take 1 tablet by mouth daily.     nitrofurantoin, macrocrystal-monohydrate, (MACROBID) 100 MG capsule TAKE 1 CAPSULE BY MOUTH EVERY MONDAY, WEDNESDAY AND FRIDAY 12 capsule 5   omeprazole (PRILOSEC) 40 MG capsule TAKE (1) CAPSULE BY MOUTH ONCE DAILY FOR ACID REFLUX. 90 capsule 0   OXYGEN-HELIUM IN Inhale 2 L into the lungs at bedtime.     torsemide (DEMADEX) 20 MG tablet Take 1 tablet (20 mg total) by mouth daily. 90 tablet 0   warfarin (COUMADIN) 5 MG tablet TAKE 1/2 TABLET TO 1 TABLET BY MOUTH DAILY OR AS DIRECTED BY COUMADIN CLINIC 30 tablet 0   zinc gluconate 50 MG tablet Take 50 mg by mouth daily.     Current Facility-Administered Medications  Medication Dose Route Frequency Provider Last Rate Last Admin   miconazole (MONISTAT 7) 2 % vaginal cream 1 Applicatorful  1 Applicatorful Vaginal QHS Tilda Burrow, MD         Past Medical History:  Diagnosis Date   Allergy  Annual physical exam 04/22/2015   Anxiety    Arthritis    Arthritis    Asthma    Atrial fibrillation (HCC)    Bleeding disorder (HCC)    CHF (congestive heart failure) (HCC)    CKD (chronic kidney disease) stage 4, GFR 15-29 ml/min (HCC) 09/08/2021   COPD (chronic obstructive pulmonary disease) (HCC)    Depression    Gastroesophageal reflux disease    Hearing impairment    Heart disease    Heart murmur    High cholesterol    Hyperlipidemia    Hypertension    Hypothyroidism    Impaired glucose tolerance    Macular degeneration    Oxygen deficiency    Pancreatitis    Paroxysmal atrial fibrillation (HCC) 2011   Sick sinus syndrome (HCC) 2011   Atrial fibrilation 10/2009; and dual chamber pacemaker in 4/11; normal EF   Sleep apnea    Nocturnal oxygen  therapy   Zoster 2016    ROS:   All systems reviewed and negative except as noted in the HPI.   Past Surgical History:  Procedure Laterality Date   ABDOMINAL HYSTERECTOMY     CATARACT EXTRACTION     Bilateral   COLONOSCOPY  08/29/2007   Dr. Jena Gauss   EYE SURGERY     laser    PACEMAKER INSERTION  08/28/2009   dual chamber    PPM GENERATOR CHANGEOUT N/A 11/13/2016   Procedure: PPM Generator Changeout;  Surgeon: Marinus Maw, MD;  Location: [facility];  Service: Cardiovascular;  Laterality: N/A;   SALPINGOOPHORECTOMY  08/28/1968   right     Family History  Problem Relation Age of Onset   Cancer Mother        gential   Cancer Father        throat    Pneumonia Sister    Emphysema Sister    Mitral valve prolapse Sister    Heart disease Other    Arthritis Other    Lung disease Other    Asthma Other    Melanoma Son    Anxiety disorder Daughter    Arthritis Daughter    Asthma Daughter    COPD Daughter    Depression Daughter    Kidney disease Daughter    Obesity Daughter    Anxiety disorder Daughter    Anxiety disorder Daughter    Arthritis Daughter      Social History   Socioeconomic History   Marital status: Widowed    Spouse name: Not on file   Number of children: 7   Years of education: college   Highest education level: Not on file  Occupational History   Occupation: retired     Associate Professor: RETIRED  Tobacco Use   Smoking status: Never    Passive exposure: Never   Smokeless tobacco: Never  Vaping Use   Vaping status: Never Used  Substance and Sexual Activity   Alcohol use: No   Drug use: No   Sexual activity: Not Currently    Birth control/protection: Surgical    Comment: hyst  Other Topics Concern   Not on file  Social History Narrative   Not on file   Social Determinants of Health   Financial Resource Strain: Low Risk  (02/09/2020)   Overall Financial Resource Strain (CARDIA)    Difficulty of Paying Living Expenses: Not hard at  all  Food Insecurity: No Food Insecurity (02/09/2020)   Hunger Vital Sign    Worried About Running Out of  Food in the Last Year: Never true    Ran Out of Food in the Last Year: Never true  Transportation Needs: No Transportation Needs (02/09/2020)   PRAPARE - Administrator, Civil Service (Medical): No    Lack of Transportation (Non-Medical): No  Physical Activity: Inactive (02/09/2020)   Exercise Vital Sign    Days of Exercise per Week: 0 days    Minutes of Exercise per Session: 0 min  Stress: Stress Concern Present (02/09/2020)   [employer] of Occupational Health - Occupational Stress Questionnaire    Feeling of Stress : To some extent  Social Connections: Socially Isolated (02/09/2020)   Social Connection and Isolation Panel [NHANES]    Frequency of Communication with Friends and Family: Once a week    Frequency of Social Gatherings with Friends and Family: More than three times a week    Attends Religious Services: Never    Database administrator or Organizations: No    Attends Banker Meetings: Never    Marital Status: Widowed  Intimate Partner Violence: Not At Risk (07/06/2020)   Humiliation, Afraid, Rape, and Kick questionnaire    Fear of Current or Ex-Partner: No    Emotionally Abused: No    Physically Abused: No    Sexually Abused: No     BP 128/62   Pulse 74   Ht 5\' 6"  (1.676 m)   Wt 165 lb (74.8 kg)   SpO2 94%   BMI 26.63 kg/m   Physical Exam:  Well appearing NAD HEENT: Unremarkable Neck:  No JVD, no thyromegally Lymphatics:  No adenopathy Back:  No CVA tenderness Lungs:  Clear with no wheezes HEART:  Regular rate rhythm, no murmurs, no rubs, no clicks Abd:  soft, positive bowel sounds, no organomegally, no rebound, no guarding Ext:  2 plus pulses, no edema, no cyanosis, no clubbing Skin:  No rashes no nodules Neuro:  CN II through XII intact, motor grossly intact  DEVICE  Normal device function.  See PaceArt for details.    Assess/Plan:  1. Persistent atrial fib - her VR is mostly controlled. She is pacing 67%. 2. PPM - her [employer] PPM is working normally with over 4 years of battery longevity. 3. HTN - her bp is much improved. 4. Diastolic heart failure - her diuretic was changed from lasix to torsemide. She takes an extra when her weight goes up.   Sharlot Gowda Aiman Sonn,MD

## 2023-07-24 ENCOUNTER — Other Ambulatory Visit: Payer: Self-pay | Admitting: Family Medicine

## 2023-07-25 ENCOUNTER — Other Ambulatory Visit: Payer: Self-pay

## 2023-07-25 MED ORDER — LOVASTATIN 40 MG PO TABS: 40.0000 mg | ORAL_TABLET | Freq: Every day | ORAL | 2 refills | Status: DC

## 2023-07-25 NOTE — Telephone Encounter (Signed)
refilled lovastatin.

## 2023-07-28 LAB — CUP PACEART REMOTE DEVICE CHECK
Battery Remaining Longevity: 53 mo
Battery Remaining Percentage: 46 %
Battery Voltage: 2.98 V
Brady Statistic RV Percent Paced: 66 %
Date Time Interrogation Session: 20241128020018
Implantable Lead Connection Status: 753985
Implantable Lead Connection Status: 753985
Implantable Lead Implant Date: 20110422
Implantable Lead Implant Date: 20110422
Implantable Lead Location: 753859
Implantable Lead Location: 753860
Implantable Pulse Generator Implant Date: 20180319
Lead Channel Impedance Value: 400 Ohm
Lead Channel Pacing Threshold Amplitude: 1 V
Lead Channel Pacing Threshold Pulse Width: 0.5 ms
Lead Channel Sensing Intrinsic Amplitude: 7.9 mV
Lead Channel Setting Pacing Amplitude: 2 V
Lead Channel Setting Pacing Pulse Width: 0.5 ms
Lead Channel Setting Sensing Sensitivity: 1 mV
Pulse Gen Model: 2272
Pulse Gen Serial Number: 8010902

## 2023-07-31 ENCOUNTER — Ambulatory Visit (INDEPENDENT_AMBULATORY_CARE_PROVIDER_SITE_OTHER): Payer: Medicare Other

## 2023-07-31 DIAGNOSIS — I495 Sick sinus syndrome: Secondary | ICD-10-CM

## 2023-08-01 ENCOUNTER — Ambulatory Visit: Payer: Medicare Other | Attending: Cardiology | Admitting: *Deleted

## 2023-08-01 DIAGNOSIS — I480 Paroxysmal atrial fibrillation: Secondary | ICD-10-CM

## 2023-08-01 DIAGNOSIS — Z5181 Encounter for therapeutic drug level monitoring: Secondary | ICD-10-CM

## 2023-08-01 LAB — POCT INR: INR: 2.7 (ref 2.0–3.0)

## 2023-08-01 NOTE — Patient Instructions (Signed)
Continue warfarin 1/2 tablet daily except 1 tablet on Mondays and Thursdays Recheck in 4 weeks Continue greens Taking Macrobid 1 tablet 3 x wk long term

## 2023-08-17 ENCOUNTER — Other Ambulatory Visit: Payer: Self-pay | Admitting: Cardiology

## 2023-08-17 DIAGNOSIS — I480 Paroxysmal atrial fibrillation: Secondary | ICD-10-CM

## 2023-08-17 NOTE — Telephone Encounter (Signed)
Warfarin 5mg  refill Afib Last INR 08/01/23 Last OV 07/19/23

## 2023-08-22 ENCOUNTER — Other Ambulatory Visit: Payer: Self-pay | Admitting: Cardiology

## 2023-08-22 ENCOUNTER — Other Ambulatory Visit: Payer: Self-pay | Admitting: Family Medicine

## 2023-08-23 MED ORDER — DONEPEZIL HCL 10 MG PO TABS: 10.0000 mg | ORAL_TABLET | Freq: Every day | ORAL | 0 refills | Status: DC

## 2023-08-23 MED ORDER — TORSEMIDE 20 MG PO TABS: 20.0000 mg | ORAL_TABLET | Freq: Every day | ORAL | 3 refills | Status: DC

## 2023-08-23 MED ORDER — BUSPIRONE HCL 7.5 MG PO TABS: ORAL_TABLET | ORAL | 0 refills | Status: DC

## 2023-08-23 MED ORDER — METOPROLOL TARTRATE 100 MG PO TABS: 100.0000 mg | ORAL_TABLET | Freq: Two times a day (BID) | ORAL | 0 refills | Status: DC

## 2023-09-04 ENCOUNTER — Ambulatory Visit (INDEPENDENT_AMBULATORY_CARE_PROVIDER_SITE_OTHER): Payer: Medicare Other

## 2023-09-04 VITALS — BP 118/78 | HR 71 | Ht 66.0 in | Wt 167.2 lb

## 2023-09-04 DIAGNOSIS — Z0000 Encounter for general adult medical examination without abnormal findings: Secondary | ICD-10-CM | POA: Diagnosis not present

## 2023-09-04 NOTE — Progress Notes (Signed)
 Please attest and cosign this visit due to patients primary care provider not being in the office at the time the visit was completed.   Because this visit was a virtual/telehealth visit,  certain criteria was not obtained, such a blood pressure, CBG if applicable, and timed get up and go. Any medications not marked as taking were not mentioned during the medication reconciliation part of the visit. Any vitals not documented were not able to be obtained due to this being a telehealth visit or patient was unable to self-report a recent blood pressure reading due to a lack of equipment at home via telehealth. Vitals that have been documented are verbally provided by the patient or their caregiver.    Visit completed with the help of patients daughter/caregiver [name] Subjective:   [name] is a 89 y.o. female who presents for Medicare Annual (Subsequent) preventive examination.  Visit Complete: Virtual I connected with  [name] on 09/04/23 by a video and audio enabled telemedicine application and verified that I am speaking with the correct person using two identifiers.  Patient Location: Home  Provider Location: Home Office  I discussed the limitations of evaluation and management by telemedicine. The patient expressed understanding and agreed to proceed.  Vital Signs: Because this visit was a virtual/telehealth visit, some criteria may be missing or patient reported. Any vitals not documented were not able to be obtained and vitals that have been documented are patient reported.  Patient Medicare AWV questionnaire was completed by the patient on na; I have confirmed that all information answered by patient is correct and no changes since this date.  Cardiac Risk Factors include: advanced age (>28men, >37 women);dyslipidemia;hypertension;sedentary lifestyle     Objective:    Today's Vitals   09/04/23 1301  BP: 118/78  Pulse: 71  SpO2: 97%  Weight: 167 lb 3.2 oz (75.8 kg)   Height: 5' 6 (1.676 m)   Body mass index is 26.99 kg/m.     09/04/2023    1:00 PM 02/08/2023    7:20 PM 07/27/2021   11:59 AM 07/06/2020    1:37 PM 07/08/2019    3:11 PM 07/04/2019    7:26 AM 07/03/2019    9:03 PM  Advanced Directives  Does Patient Have a Medical Advance Directive? No No No No No No No  Would patient like information on creating a medical advance directive? No - Patient declined  Yes (ED - Information included in AVS) Yes (ED - Information included in AVS) No - Patient declined No - Patient declined No - Patient declined    Current Medications (verified) Outpatient Encounter Medications as of 09/04/2023  Medication Sig   Ascorbic Acid (VITAMIN C PO) Take 1 tablet by mouth daily.   azelastine  (ASTELIN ) 0.1 % nasal spray Place 2 sprays into both nostrils 2 (two) times daily. Use in each nostril as directed   busPIRone  (BUSPAR ) 7.5 MG tablet TAKE (1) TABLET BY MOUTH TWICE DAILY.   Cholecalciferol (VITAMIN D3 PO) Take 2 tablets by mouth daily.   clobetasol  cream (TEMOVATE ) 0.05 % Apply 1 Application topically 2 (two) times daily. Apply sparingly twice daily to rash on neck for 1 week, then as needed   clotrimazole -betamethasone  (LOTRISONE ) cream Apply to affected area(s) twice daily as needed, for 5 to 7 days   Cyanocobalamin (VITAMIN B12 PO) Take 1 tablet by mouth daily.   diltiazem  (CARDIZEM  CD) 300 MG 24 hr capsule TAKE ONE CAPSULE BY MOUTH ONCE DAILY.   donepezil  (ARICEPT )  10 MG tablet Take 1 tablet (10 mg total) by mouth at bedtime.   fluticasone  (FLONASE ) 50 MCG/ACT nasal spray Place 1 spray into both nostrils daily.   levothyroxine  (SYNTHROID ) 112 MCG tablet Take 1 tablet (112 mcg total) by mouth daily.   loratadine  (CLARITIN ) 10 MG tablet Take 1 tablet (10 mg total) by mouth daily.   lovastatin  (MEVACOR ) 40 MG tablet Take 1 tablet (40 mg total) by mouth at bedtime.   meclizine  (ANTIVERT ) 12.5 MG tablet Take 1 tablet (12.5 mg total) by mouth 2 (two) times daily as  needed for dizziness.   metoprolol  tartrate (LOPRESSOR ) 100 MG tablet Take 1 tablet (100 mg total) by mouth 2 (two) times daily.   metoprolol  tartrate (LOPRESSOR ) 25 MG tablet Take 1 tablet (25 mg total) by mouth 2 (two) times daily.   Multiple Vitamins-Minerals (EYE VITAMINS & MINERALS PO) Take 1 tablet by mouth daily.   nitrofurantoin , macrocrystal-monohydrate, (MACROBID ) 100 MG capsule TAKE 1 CAPSULE BY MOUTH EVERY MONDAY, WEDNESDAY AND FRIDAY   omeprazole  (PRILOSEC) 40 MG capsule TAKE (1) CAPSULE BY MOUTH ONCE DAILY FOR ACID REFLUX.   OXYGEN -HELIUM IN Inhale 2 L into the lungs at bedtime.   torsemide  (DEMADEX ) 20 MG tablet Take 1 tablet (20 mg total) by mouth daily.   warfarin (COUMADIN ) 5 MG tablet TAKE 1/2 TABLET TO 1 TABLET BY MOUTH DAILY OR AS DIRECTED BY COUMADIN  CLINIC   zinc gluconate 50 MG tablet Take 50 mg by mouth daily.   [DISCONTINUED] benzonatate  (TESSALON  PERLES) 100 MG capsule Take 1 capsule (100 mg total) by mouth every 6 (six) hours as needed for cough.   Facility-Administered Encounter Medications as of 09/04/2023  Medication   miconazole  (MONISTAT  7) 2 % vaginal cream 1 Applicatorful    Allergies (verified) Patient has no known allergies.   History: Past Medical History:  Diagnosis Date   Allergy    Annual physical exam 04/22/2015   Anxiety    Arthritis    Arthritis    Asthma    Atrial fibrillation (HCC)    Bleeding disorder (HCC)    CHF (congestive heart failure) (HCC)    CKD (chronic kidney disease) stage 4, GFR 15-29 ml/min (HCC) 09/08/2021   COPD (chronic obstructive pulmonary disease) (HCC)    Depression    Gastroesophageal reflux disease    Hearing impairment    Heart disease    Heart murmur    High cholesterol    Hyperlipidemia    Hypertension    Hypothyroidism    Impaired glucose tolerance    Macular degeneration    Oxygen  deficiency    Pancreatitis    Paroxysmal atrial fibrillation (HCC) 2011   Sick sinus syndrome (HCC) 2011   Atrial  fibrilation 10/2009; and dual chamber pacemaker in 4/11; normal EF   Sleep apnea    Nocturnal oxygen  therapy   Zoster 2016   Past Surgical History:  Procedure Laterality Date   ABDOMINAL HYSTERECTOMY     CATARACT EXTRACTION     Bilateral   COLONOSCOPY  08/29/2007   Dr. Shaaron   EYE SURGERY     laser    PACEMAKER INSERTION  08/28/2009   dual chamber    PPM GENERATOR CHANGEOUT N/A 11/13/2016   Procedure: PPM Generator Changeout;  Surgeon: Waddell Danelle ORN, MD;  Location: [facility];  Service: Cardiovascular;  Laterality: N/A;   SALPINGOOPHORECTOMY  08/28/1968   right   Family History  Problem Relation Age of Onset   Cancer Mother  gential   Cancer Father        throat    Pneumonia Sister    Emphysema Sister    Mitral valve prolapse Sister    Heart disease Other    Arthritis Other    Lung disease Other    Asthma Other    Melanoma Son    Anxiety disorder Daughter    Arthritis Daughter    Asthma Daughter    COPD Daughter    Depression Daughter    Kidney disease Daughter    Obesity Daughter    Anxiety disorder Daughter    Anxiety disorder Daughter    Arthritis Daughter    Social History   Socioeconomic History   Marital status: Widowed    Spouse name: Not on file   Number of children: 7   Years of education: college   Highest education level: Not on file  Occupational History   Occupation: retired     Associate Professor: RETIRED  Tobacco Use   Smoking status: Never    Passive exposure: Never   Smokeless tobacco: Never  Vaping Use   Vaping status: Never Used  Substance and Sexual Activity   Alcohol use: No   Drug use: No   Sexual activity: Not Currently    Birth control/protection: Surgical    Comment: hyst  Other Topics Concern   Not on file  Social History Narrative   Not on file   Social Drivers of Health   Financial Resource Strain: Low Risk  (09/04/2023)   Overall Financial Resource Strain (CARDIA)    Difficulty of Paying Living Expenses:  Not hard at all  Food Insecurity: No Food Insecurity (09/04/2023)   Hunger Vital Sign    Worried About Running Out of Food in the Last Year: Never true    Ran Out of Food in the Last Year: Never true  Transportation Needs: No Transportation Needs (09/04/2023)   PRAPARE - Administrator, Civil Service (Medical): No    Lack of Transportation (Non-Medical): No  Physical Activity: Inactive (09/04/2023)   Exercise Vital Sign    Days of Exercise per Week: 0 days    Minutes of Exercise per Session: 0 min  Stress: No Stress Concern Present (09/04/2023)   [employer] of Occupational Health - Occupational Stress Questionnaire    Feeling of Stress : Not at all  Social Connections: Socially Isolated (09/04/2023)   Social Connection and Isolation Panel [NHANES]    Frequency of Communication with Friends and Family: More than three times a week    Frequency of Social Gatherings with Friends and Family: Once a week    Attends Religious Services: Never    Database Administrator or Organizations: No    Attends Banker Meetings: Never    Marital Status: Widowed    Tobacco Counseling Counseling given: Yes   Clinical Intake:  Pre-visit preparation completed: Yes  Pain : No/denies pain     BMI - recorded: 26.99 Nutritional Status: BMI 25 -29 Overweight Nutritional Risks: None Diabetes: No  How often do you need to have someone help you when you read instructions, pamphlets, or other written materials from your doctor or pharmacy?: 1 - Never  Interpreter Needed?: No  Information entered by :: Chailyn Racette, CMA   Activities of Daily Living    09/04/2023    1:10 PM  In your present state of health, do you have any difficulty performing the following activities:  Hearing? 0  Vision? 1  Comment legally blind  Difficulty concentrating or making decisions? 0  Walking or climbing stairs? 1  Dressing or bathing? 0  Comment daughter helps patient get into and out of  the bathtub, however, patient is able to bathe herself  Doing errands, shopping? 1  Comment family takes her to where she needs to go  Preparing Food and eating ? Y  Comment daughter prepares the meals  Using the Toilet? N  In the past six months, have you accidently leaked urine? N  Do you have problems with loss of bowel control? N  Managing your Medications? Y  Comment daughter helps her  Managing your Finances? Y  Comment daughter Neurosurgeon or managing your Housekeeping? Y  Comment daughter does this    Patient Care Team: [name], MD as PCP - General (Family Medicine) Alvan Dorn FALCON, MD as PCP - Cardiology (Cardiology) Waddell Danelle ORN, MD (Cardiology) Shaaron Lamar HERO, MD as Attending Physician (Gastroenterology) Alvia Norleen BIRCH, MD as Attending Physician (Ophthalmology)  Indicate any recent Medical Services you may have received from other than [facility] providers in the past year (date may be approximate).     Assessment:   This is a routine wellness examination for Natara.  Hearing/Vision screen Hearing Screening - Comments:: Patient denies any hearing difficulties.   Vision Screening - Comments:: Patients daughter states she no longer has to go to the eye doctor. There isn't anything that can be done for the patient   Goals Addressed             This Visit's Progress    Patient Stated       I'd like to walk alone again       Depression Screen    09/04/2023    1:14 PM 06/01/2023   11:00 AM 02/08/2023   11:05 AM 09/07/2022    3:39 PM 08/14/2022   10:58 AM 08/09/2021    1:19 PM 04/15/2021    2:57 PM  PHQ 2/9 Scores  PHQ - 2 Score 0 2 2 0 0  2  PHQ- 9 Score  3 6    3   Exception Documentation      Other- indicate reason in comment box   Not completed      Spoke with pt's daughter     Fall Risk    09/04/2023    1:10 PM 06/01/2023   11:00 AM 02/08/2023   11:05 AM 09/07/2022    3:39 PM 04/20/2022    3:59 PM  Fall Risk   Falls  in the past year? 1 1 1 1  0  Number falls in past yr: 0 0 0 1 0  Injury with Fall? 0 0 0 0 0  Risk for fall due to : Impaired balance/gait;Impaired mobility;Impaired vision History of fall(s);Impaired balance/gait No Fall Risks History of fall(s);Impaired balance/gait   Follow up Education provided;Falls prevention discussed Falls evaluation completed Falls evaluation completed Falls evaluation completed     MEDICARE RISK AT HOME: Medicare Risk at Home Any stairs in or around the home?: No If so, are there any without handrails?: No Home free of loose throw rugs in walkways, pet beds, electrical cords, etc?: Yes Adequate lighting in your home to reduce risk of falls?: Yes Life alert?: No Use of a cane, walker or w/c?: Yes Grab bars in the bathroom?: Yes Shower chair or bench in shower?: Yes Elevated toilet seat or a handicapped toilet?: No  TIMED UP AND GO:  Was the  test performed?  No    Cognitive Function:    11/13/2019   10:39 AM 09/24/2019   11:32 AM  MMSE - Mini Mental State Exam  Orientation to time 5 5  Orientation to Place 5 5  Registration 3 3  Attention/ Calculation 5 5  Recall 3 3  Language- name 2 objects 2 2  Language- repeat 1 1  Language- follow 3 step command 3 3  Language- read & follow direction 1 1  Write a sentence 1 1  Copy design 0 0  Total score 29 29        09/04/2023    1:08 PM 08/14/2022   10:58 AM 07/27/2021   12:04 PM 07/06/2020    1:41 PM 06/17/2018    2:43 PM  6CIT Screen  What Year? 0 points 0 points 0 points 0 points 0 points  What month? 0 points 0 points 0 points 0 points 0 points  What time? 0 points 0 points 0 points 0 points 0 points  Count back from 20 0 points 0 points 0 points 0 points 2 points  Months in reverse 0 points 0 points 4 points 0 points 0 points  Repeat phrase 0 points 2 points 0 points 0 points 0 points  Total Score 0 points 2 points 4 points 0 points 2 points    Immunizations Immunization History   Administered Date(s) Administered   Fluad Quad(high Dose 65+) 05/15/2019, 06/17/2020, 05/19/2021, 05/10/2022   Fluad Trivalent(High Dose 65+) 06/01/2023   H1N1 07/01/2008   Influenza Split 06/14/2012   Influenza Whole 06/22/2006, 05/27/2008, 06/10/2009, 06/09/2010, 05/10/2011   Influenza,inj,Quad PF,6+ Mos 06/09/2013, 08/12/2014, 07/15/2015, 04/19/2016, 07/04/2017, 06/17/2018   Pneumococcal Conjugate-13 03/18/2014   Pneumococcal Polysaccharide-23 06/09/2008   Tdap 04/22/2015    TDAP status: Up to date  Flu Vaccine status: Up to date  Pneumococcal vaccine status: Up to date  Covid-19 vaccine status: Completed vaccines  Qualifies for Shingles Vaccine? No     Screening Tests Health Maintenance  Topic Date Due   Medicare Annual Wellness (AWV)  05/31/2024   DTaP/Tdap/Td (2 - Td or Tdap) 04/21/2025   Pneumonia Vaccine 50+ Years old  Completed   INFLUENZA VACCINE  Completed   DEXA SCAN  Completed   HPV VACCINES  Aged Out   COVID-19 Vaccine  Discontinued   Zoster Vaccines- Shingrix  Discontinued    Health Maintenance  There are no preventive care reminders to display for this patient.  Colorectal cancer screening: No longer required.   Mammogram: Not age appropriate for this patient  Bone Density Screening: Not age appropriate for this patient.   Lung Cancer Screening: (Low Dose CT Chest recommended if Age 32-80 years, 20 pack-year currently smoking OR have quit w/in 15years.) does not qualify.   Lung Cancer Screening Referral: na  Additional Screening:  Hepatitis C Screening: does not qualify   Vision Screening: Recommended annual ophthalmology exams for early detection of glaucoma and other disorders of the eye. Is the patient up to date with their annual eye exam?  No  Who is the provider or what is the name of the office in which the patient attends annual eye exams? Per daughter [name], patient no longer goes to the eye doctor because there is nothing they can do  to help improve her eye sight. Patient is legally blind.   Dental Screening: Recommended annual dental exams for proper oral hygiene  Diabetic Foot Exam: na  Community Resource Referral / Chronic Care Management: CRR required  this visit?  No   CCM required this visit?  No     Plan:     I have personally reviewed and noted the following in the patient's chart:   Medical and social history Use of alcohol, tobacco or illicit drugs  Current medications and supplements including opioid prescriptions. Patient is not currently taking opioid prescriptions. Functional ability and status Nutritional status Physical activity Advanced directives List of other physicians Hospitalizations, surgeries, and ER visits in previous 12 months Vitals Screenings to include cognitive, depression, and falls Referrals and appointments  In addition, I have reviewed and discussed with patient certain preventive protocols, quality metrics, and best practice recommendations. A written personalized care plan for preventive services as well as general preventive health recommendations were provided to patient.     [facility] Shelby Anderle, CMA   09/04/2023   After Visit Summary: (MyChart) Due to this being a telephonic visit, the after visit summary with patients personalized plan was offered to patient via MyChart

## 2023-09-04 NOTE — Patient Instructions (Signed)
 [name] , Thank you for taking time to come for your Medicare Wellness Visit. I appreciate your ongoing commitment to your health goals. Please review the following plan we discussed and let me know if I can assist you in the future.   Referrals/Orders/Follow-Ups/Clinician Recommendations:  Next Medicare Annual Wellness Visit: September 08, 2024 at 10:00 am virtual visit  This is a list of the screening recommended for you and due dates:  Health Maintenance  Topic Date Due   Medicare Annual Wellness Visit  09/03/2024   DTaP/Tdap/Td vaccine (2 - Td or Tdap) 04/21/2025   Pneumonia Vaccine  Completed   Flu Shot  Completed   DEXA scan (bone density measurement)  Completed   HPV Vaccine  Aged Out   COVID-19 Vaccine  Discontinued   Zoster (Shingles) Vaccine  Discontinued    Advanced directives: (ACP Link)Information on Advanced Care Planning can be found at [place]  Secretary of [facility] Advance Health Care Directives Advance Health Care Directives ([email or web address]   Next Medicare Annual Wellness Visit scheduled for next year: Yes  Preventive Care 65 Years and Older, Female Preventive care refers to lifestyle choices and visits with your health care provider that can promote health and wellness. Preventive care visits are also called wellness exams. What can I expect for my preventive care visit? Counseling Your health care provider may ask you questions about your: Medical history, including: Past medical problems. Family medical history. Pregnancy and menstrual history. History of falls. Current health, including: Memory and ability to understand (cognition). Emotional well-being. Home life and relationship well-being. Sexual activity and sexual health. Lifestyle, including: Alcohol, nicotine or tobacco, and drug use. Access to firearms. Diet, exercise, and sleep habits. Work and work astronomer. Sunscreen use. Safety issues such as seatbelt and bike helmet use. Physical  exam Your health care provider will check your: Height and weight. These may be used to calculate your BMI (body mass index). BMI is a measurement that tells if you are at a healthy weight. Waist circumference. This measures the distance around your waistline. This measurement also tells if you are at a healthy weight and may help predict your risk of certain diseases, such as type 2 diabetes and high blood pressure. Heart rate and blood pressure. Body temperature. Skin for abnormal spots. What immunizations do I need?  Vaccines are usually given at various ages, according to a schedule. Your health care provider will recommend vaccines for you based on your age, medical history, and lifestyle or other factors, such as travel or where you work. What tests do I need? Screening Your health care provider may recommend screening tests for certain conditions. This may include: Lipid and cholesterol levels. Hepatitis C test. Hepatitis B test. HIV (human immunodeficiency virus) test. STI (sexually transmitted infection) testing, if you are at risk. Lung cancer screening. Colorectal cancer screening. Diabetes screening. This is done by checking your blood sugar (glucose) after you have not eaten for a while (fasting). Mammogram. Talk with your health care provider about how often you should have regular mammograms. BRCA-related cancer screening. This may be done if you have a family history of breast, ovarian, tubal, or peritoneal cancers. Bone density scan. This is done to screen for osteoporosis. Talk with your health care provider about your test results, treatment options, and if necessary, the need for more tests. Follow these instructions at home: Eating and drinking  Eat a diet that includes fresh fruits and vegetables, whole grains, lean protein, and low-fat dairy products. Limit your  intake of foods with high amounts of sugar, saturated fats, and salt. Take vitamin and mineral  supplements as recommended by your health care provider. Do not drink alcohol if your health care provider tells you not to drink. If you drink alcohol: Limit how much you have to 0-1 drink a day. Know how much alcohol is in your drink. In the [place] one drink equals one 12 oz bottle of beer (355 mL), one 5 oz glass of wine (148 mL), or one 1 oz glass of hard liquor (44 mL). Lifestyle Brush your teeth every morning and night with fluoride toothpaste. Floss one time each day. Exercise for at least 30 minutes 5 or more days each week. Do not use any products that contain nicotine or tobacco. These products include cigarettes, chewing tobacco, and vaping devices, such as e-cigarettes. If you need help quitting, ask your health care provider. Do not use drugs. If you are sexually active, practice safe sex. Use a condom or other form of protection in order to prevent STIs. Take aspirin  only as told by your health care provider. Make sure that you understand how much to take and what form to take. Work with your health care provider to find out whether it is safe and beneficial for you to take aspirin  daily. Ask your health care provider if you need to take a cholesterol-lowering medicine (statin). Find healthy ways to manage stress, such as: Meditation, yoga, or listening to music. Journaling. Talking to a trusted person. Spending time with friends and family. Minimize exposure to UV radiation to reduce your risk of skin cancer. Safety Always wear your seat belt while driving or riding in a vehicle. Do not drive: If you have been drinking alcohol. Do not ride with someone who has been drinking. When you are tired or distracted. While texting. If you have been using any mind-altering substances or drugs. Wear a helmet and other protective equipment during sports activities. If you have firearms in your house, make sure you follow all gun safety procedures. What's next? Visit your health care  provider once a year for an annual wellness visit. Ask your health care provider how often you should have your eyes and teeth checked. Stay up to date on all vaccines. This information is not intended to replace advice given to you by your health care provider. Make sure you discuss any questions you have with your health care provider. Document Revised: 02/09/2021 Document Reviewed: 02/09/2021 Elsevier Patient Education  2024 [employer]. Understanding Your Risk for Falls Millions of people have serious injuries from falls each year. It is important to understand your risk of falling. Talk with your health care provider about your risk and what you can do to lower it. If you do have a serious fall, make sure to tell your provider. Falling once raises your risk of falling again. How can falls affect me? Serious injuries from falls are common. These include: Broken bones, such as hip fractures. Head injuries, such as traumatic brain injuries (TBI) or concussions. A fear of falling can cause you to avoid activities and stay at home. This can make your muscles weaker and raise your risk for a fall. What can increase my risk? There are a number of risk factors that increase your risk for falling. The more risk factors you have, the higher your risk of falling. Serious injuries from a fall happen most often to people who are older than 88 years old. Teenagers and young adults ages 65-29  are also at higher risk. Common risk factors include: Weakness in the lower body. Being generally weak or confused due to long-term (chronic) illness. Dizziness or balance problems. Poor vision. Medicines that cause dizziness or drowsiness. These may include: Medicines for your blood pressure, heart, anxiety, insomnia, or swelling (edema). Pain medicines. Muscle relaxants. Other risk factors include: Drinking alcohol. Having had a fall in the past. Having foot pain or wearing improper footwear. Working at a  dangerous job. Having any of the following in your home: Tripping hazards, such as floor clutter or loose rugs. Poor lighting. Pets. Having dementia or memory loss. What actions can I take to lower my risk of falling?     Physical activity Stay physically fit. Do strength and balance exercises. Consider taking a regular class to build strength and balance. Yoga and tai chi are good options. Vision Have your eyes checked every year and your prescription for glasses or contacts updated as needed. Shoes and walking aids Wear non-skid shoes. Wear shoes that have rubber soles and low heels. Do not wear high heels. Do not walk around the house in socks or slippers. Use a cane or walker as told by your provider. Home safety Attach secure railings on both sides of your stairs. Install grab bars for your bathtub, shower, and toilet. Use a non-skid mat in your bathtub or shower. Attach bath mats securely with double-sided, non-slip rug tape. Use good lighting in all rooms. Keep a flashlight near your bed. Make sure there is a clear path from your bed to the bathroom. Use night-lights. Do not use throw rugs. Make sure all carpeting is taped or tacked down securely. Remove all clutter from walkways and stairways, including extension cords. Repair uneven or broken steps and floors. Avoid walking on icy or slippery surfaces. Walk on the grass instead of on icy or slick sidewalks. Use ice melter to get rid of ice on walkways in the winter. Use a cordless phone. Questions to ask your health care provider Can you help me check my risk for a fall? Do any of my medicines make me more likely to fall? Should I take a vitamin D  supplement? What exercises can I do to improve my strength and balance? Should I make an appointment to have my vision checked? Do I need a bone density test to check for weak bones (osteoporosis)? Would it help to use a cane or a walker? Where to find more  information Centers for Disease Control and Prevention, STEADI: tonerpromos.no Community-Based Fall Prevention Programs: tonerpromos.no [employer] on Aging: baseringtones.pl Contact a health care provider if: You fall at home. You are afraid of falling at home. You feel weak, drowsy, or dizzy. This information is not intended to replace advice given to you by your health care provider. Make sure you discuss any questions you have with your health care provider. Document Revised: 04/17/2022 Document Reviewed: 04/17/2022 Elsevier Patient Education  2024 [employer].

## 2023-09-11 ENCOUNTER — Other Ambulatory Visit: Payer: Self-pay | Admitting: Cardiology

## 2023-09-11 DIAGNOSIS — I480 Paroxysmal atrial fibrillation: Secondary | ICD-10-CM

## 2023-09-11 NOTE — Telephone Encounter (Signed)
 Refill request for warfarin:  Last INR was 2.7 on 08/01/23 Next INR due 09/13/23 LOV was 07/19/23  Refill approved.

## 2023-09-13 ENCOUNTER — Ambulatory Visit: Payer: Medicare Other

## 2023-09-18 ENCOUNTER — Other Ambulatory Visit: Payer: Self-pay | Admitting: Family Medicine

## 2023-09-18 MED ORDER — BUSPIRONE HCL 7.5 MG PO TABS: ORAL_TABLET | ORAL | 0 refills | Status: DC

## 2023-09-18 MED ORDER — OMEPRAZOLE 40 MG PO CPDR: 40.0000 mg | DELAYED_RELEASE_CAPSULE | Freq: Every day | ORAL | 0 refills | Status: DC

## 2023-09-18 MED ORDER — LEVOTHYROXINE SODIUM 112 MCG PO TABS: 112.0000 ug | ORAL_TABLET | Freq: Every day | ORAL | 0 refills | Status: DC

## 2023-09-19 ENCOUNTER — Encounter: Payer: Self-pay | Admitting: Cardiology

## 2023-09-26 ENCOUNTER — Ambulatory Visit: Payer: Medicare Other | Attending: Cardiology | Admitting: *Deleted

## 2023-09-26 DIAGNOSIS — I480 Paroxysmal atrial fibrillation: Secondary | ICD-10-CM

## 2023-09-26 DIAGNOSIS — Z5181 Encounter for therapeutic drug level monitoring: Secondary | ICD-10-CM

## 2023-09-26 LAB — POCT INR: INR: 3.4 — AB (ref 2.0–3.0)

## 2023-09-26 NOTE — Patient Instructions (Signed)
Hold warfarin tonight then decrease dose to 1/2 tablet daily except 1 tablet on Mondays  Recheck in 4 weeks Continue greens Taking Macrobid 1 tablet 3 x wk long term

## 2023-10-14 ENCOUNTER — Other Ambulatory Visit: Payer: Self-pay | Admitting: Family Medicine

## 2023-10-15 MED ORDER — BUSPIRONE HCL 7.5 MG PO TABS: ORAL_TABLET | ORAL | 0 refills | Status: DC

## 2023-10-24 ENCOUNTER — Ambulatory Visit: Payer: Medicare Other | Admitting: Family Medicine

## 2023-10-25 ENCOUNTER — Ambulatory Visit: Payer: Medicare Other | Attending: Cardiology | Admitting: *Deleted

## 2023-10-25 DIAGNOSIS — I480 Paroxysmal atrial fibrillation: Secondary | ICD-10-CM | POA: Diagnosis not present

## 2023-10-25 DIAGNOSIS — Z5181 Encounter for therapeutic drug level monitoring: Secondary | ICD-10-CM | POA: Diagnosis not present

## 2023-10-25 LAB — POCT INR: INR: 2.9 (ref 2.0–3.0)

## 2023-10-25 NOTE — Patient Instructions (Signed)
 Continue warfarin 1/2 tablet daily except 1 tablet on Mondays  Recheck in 4 weeks Continue greens Taking Macrobid 1 tablet 3 x wk long term

## 2023-10-30 ENCOUNTER — Ambulatory Visit (INDEPENDENT_AMBULATORY_CARE_PROVIDER_SITE_OTHER): Payer: Medicare Other

## 2023-10-30 DIAGNOSIS — I480 Paroxysmal atrial fibrillation: Secondary | ICD-10-CM

## 2023-10-30 DIAGNOSIS — I495 Sick sinus syndrome: Secondary | ICD-10-CM

## 2023-10-31 LAB — CUP PACEART REMOTE DEVICE CHECK
Battery Remaining Longevity: 50 mo
Battery Remaining Percentage: 44 %
Battery Voltage: 2.98 V
Brady Statistic RV Percent Paced: 60 %
Date Time Interrogation Session: 20250227030545
Implantable Lead Connection Status: 753985
Implantable Lead Connection Status: 753985
Implantable Lead Implant Date: 20110422
Implantable Lead Implant Date: 20110422
Implantable Lead Location: 753859
Implantable Lead Location: 753860
Implantable Pulse Generator Implant Date: 20180319
Lead Channel Impedance Value: 380 Ohm
Lead Channel Pacing Threshold Amplitude: 1 V
Lead Channel Pacing Threshold Pulse Width: 0.5 ms
Lead Channel Sensing Intrinsic Amplitude: 7.6 mV
Lead Channel Setting Pacing Amplitude: 2 V
Lead Channel Setting Pacing Pulse Width: 0.5 ms
Lead Channel Setting Sensing Sensitivity: 1 mV
Pulse Gen Model: 2272
Pulse Gen Serial Number: 8010902

## 2023-11-01 ENCOUNTER — Encounter: Payer: Self-pay | Admitting: Internal Medicine

## 2023-11-12 ENCOUNTER — Other Ambulatory Visit: Payer: Self-pay | Admitting: Family Medicine

## 2023-11-17 ENCOUNTER — Other Ambulatory Visit: Payer: Self-pay | Admitting: Family Medicine

## 2023-11-19 MED ORDER — BUSPIRONE HCL 7.5 MG PO TABS: ORAL_TABLET | ORAL | 0 refills | Status: DC

## 2023-11-19 MED ORDER — DONEPEZIL HCL 10 MG PO TABS: 10.0000 mg | ORAL_TABLET | Freq: Every day | ORAL | 0 refills | Status: DC

## 2023-11-21 ENCOUNTER — Ambulatory Visit: Payer: Medicare Other | Attending: Cardiology | Admitting: *Deleted

## 2023-11-21 DIAGNOSIS — Z5181 Encounter for therapeutic drug level monitoring: Secondary | ICD-10-CM

## 2023-11-21 DIAGNOSIS — I480 Paroxysmal atrial fibrillation: Secondary | ICD-10-CM | POA: Diagnosis not present

## 2023-11-21 LAB — POCT INR: INR: 2.5 (ref 2.0–3.0)

## 2023-11-21 NOTE — Patient Instructions (Signed)
 Continue warfarin 1/2 tablet daily except 1 tablet on Mondays  Recheck in 5 weeks Continue greens Taking Macrobid 1 tablet 3 x wk long term

## 2023-11-25 ENCOUNTER — Other Ambulatory Visit: Payer: Self-pay | Admitting: Cardiology

## 2023-11-25 ENCOUNTER — Other Ambulatory Visit: Payer: Self-pay | Admitting: Family Medicine

## 2023-11-26 MED ORDER — LEVOTHYROXINE SODIUM 112 MCG PO TABS
112.0000 ug | ORAL_TABLET | Freq: Every day | ORAL | 0 refills | Status: DC
Start: 2023-11-26 — End: 2024-05-05

## 2023-11-26 MED ORDER — METOPROLOL TARTRATE 100 MG PO TABS: 100.0000 mg | ORAL_TABLET | Freq: Two times a day (BID) | ORAL | 0 refills | Status: DC

## 2023-11-27 DIAGNOSIS — E7849 Other hyperlipidemia: Secondary | ICD-10-CM | POA: Diagnosis not present

## 2023-11-27 DIAGNOSIS — E038 Other specified hypothyroidism: Secondary | ICD-10-CM | POA: Diagnosis not present

## 2023-11-27 DIAGNOSIS — I10 Essential (primary) hypertension: Secondary | ICD-10-CM | POA: Diagnosis not present

## 2023-11-28 LAB — CBC
Hematocrit: 33.4 % — ABNORMAL LOW (ref 34.0–46.6)
Hemoglobin: 10.7 g/dL — ABNORMAL LOW (ref 11.1–15.9)
MCH: 30 pg (ref 26.6–33.0)
MCHC: 32 g/dL (ref 31.5–35.7)
MCV: 94 fL (ref 79–97)
Platelets: 295 10*3/uL (ref 150–450)
RBC: 3.57 x10E6/uL — ABNORMAL LOW (ref 3.77–5.28)
RDW: 12.6 % (ref 11.7–15.4)
WBC: 10.3 10*3/uL (ref 3.4–10.8)

## 2023-11-28 LAB — MAGNESIUM: Magnesium: 2.2 mg/dL (ref 1.6–2.3)

## 2023-11-28 LAB — TSH: TSH: 5.46 u[IU]/mL — ABNORMAL HIGH (ref 0.450–4.500)

## 2023-11-28 LAB — CMP14+EGFR
ALT: 7 IU/L (ref 0–32)
AST: 17 IU/L (ref 0–40)
Albumin: 4.1 g/dL (ref 3.6–4.6)
Alkaline Phosphatase: 128 IU/L — ABNORMAL HIGH (ref 44–121)
BUN/Creatinine Ratio: 15 (ref 12–28)
BUN: 26 mg/dL (ref 10–36)
Bilirubin Total: 0.4 mg/dL (ref 0.0–1.2)
CO2: 24 mmol/L (ref 20–29)
Calcium: 9.4 mg/dL (ref 8.7–10.3)
Chloride: 100 mmol/L (ref 96–106)
Creatinine, Ser: 1.68 mg/dL — ABNORMAL HIGH (ref 0.57–1.00)
Globulin, Total: 2.9 g/dL (ref 1.5–4.5)
Glucose: 114 mg/dL — ABNORMAL HIGH (ref 70–99)
Potassium: 4.1 mmol/L (ref 3.5–5.2)
Sodium: 140 mmol/L (ref 134–144)
Total Protein: 7 g/dL (ref 6.0–8.5)
eGFR: 28 mL/min/{1.73_m2} — ABNORMAL LOW (ref >59)

## 2023-11-28 LAB — LIPID PANEL
Chol/HDL Ratio: 2.2 ratio (ref 0.0–4.4)
Cholesterol, Total: 141 mg/dL (ref 100–199)
HDL: 65 mg/dL (ref >39)
LDL Chol Calc (NIH): 56 mg/dL (ref 0–99)
Triglycerides: 111 mg/dL (ref 0–149)
VLDL Cholesterol Cal: 20 mg/dL (ref 5–40)

## 2023-11-29 ENCOUNTER — Ambulatory Visit: Payer: Self-pay | Admitting: Family Medicine

## 2023-11-29 VITALS — BP 125/73 | HR 69 | Resp 16 | Ht 66.0 in | Wt 163.8 lb

## 2023-11-29 DIAGNOSIS — E559 Vitamin D deficiency, unspecified: Secondary | ICD-10-CM | POA: Diagnosis not present

## 2023-11-29 DIAGNOSIS — Z9181 History of falling: Secondary | ICD-10-CM

## 2023-11-29 DIAGNOSIS — I10 Essential (primary) hypertension: Secondary | ICD-10-CM

## 2023-11-29 DIAGNOSIS — E038 Other specified hypothyroidism: Secondary | ICD-10-CM | POA: Diagnosis not present

## 2023-11-29 DIAGNOSIS — E7849 Other hyperlipidemia: Secondary | ICD-10-CM

## 2023-11-29 DIAGNOSIS — R8290 Unspecified abnormal findings in urine: Secondary | ICD-10-CM

## 2023-11-29 DIAGNOSIS — R300 Dysuria: Secondary | ICD-10-CM

## 2023-11-29 DIAGNOSIS — Z23 Encounter for immunization: Secondary | ICD-10-CM

## 2023-11-29 NOTE — Assessment & Plan Note (Signed)
 Controlled, no change in medication DASH diet and commitment to daily physical activity for a minimum of 30 minutes discussed and encouraged, as a part of hypertension management. The importance of attaining a healthy weight is also discussed.     11/29/2023    1:27 PM 09/04/2023    1:01 PM 07/19/2023   11:28 AM 07/11/2023   10:11 AM 06/01/2023   10:59 AM 02/08/2023   11:30 PM 02/08/2023   10:15 PM  BP/Weight  Systolic BP 125 118 128 122 112 136 138  Diastolic BP 73 78 62 62 66 97 73  Wt. (Lbs) 163.8 167.2 165 164 165.8    BMI 26.44 kg/m2 26.99 kg/m2 26.63 kg/m2 26.47 kg/m2 26.76 kg/m2

## 2023-11-29 NOTE — Assessment & Plan Note (Signed)
 Updated lab needed at/ before next visit.

## 2023-11-29 NOTE — Patient Instructions (Addendum)
 Annual exam in October , call if you need me sooner  Pneumonia 20 today  Urine for c/S only  Non fasting labs 3 to 5 days before next appointment  Urine to be sent for c/S only   Careful not to fall and continue to enjoy life keep [place] laughing , an get [place] straight!  Thanks for choosing [facility], we consider it a privelige to serve you.

## 2023-11-29 NOTE — Assessment & Plan Note (Addendum)
 Urine c/s only, reports malodorus urine also, will wait on c/s before absx prescribed

## 2023-12-02 ENCOUNTER — Encounter: Payer: Self-pay | Admitting: Family Medicine

## 2023-12-02 DIAGNOSIS — Z23 Encounter for immunization: Secondary | ICD-10-CM | POA: Insufficient documentation

## 2023-12-02 NOTE — Progress Notes (Signed)
 [name]     MRN: [id number]      DOB: 10/10/1925  Chief Complaint  Patient presents with   Urinary Tract Infection    Thinks she has uti based on urine odor. Brought urine sample. Still taking Macrobid     HPI [name] is here for follow up and re-evaluation of chronic medical conditions, medication management and review of any available recent lab and radiology data.  Preventive health is updated, specifically  Cancer screening and Immunization.  Needs pneumonia 20 vaccine Questions or concerns regarding consultations or procedures which the PT has had in the interim are  addressed. The PT denies any adverse reactions to current medications since the last visit.   1 week h/o malodorous urine, no fevr, change in energy or apetitie, no flank pain   ROS Denies recent fever or chills. Denies sinus pressure, nasal congestion, ear pain or sore throat. Denies chest congestion, productive cough or wheezing. Denies chest pains, palpitations and leg swelling Denies abdominal pain, nausea, vomiting,diarrhea or constipation.   Denies dysuria, frequency, hesitancy or incontinence. Denies joint pain, swelling and limitation in mobility. Denies headaches, seizures, numbness, or tingling. Denies depression, anxiety or insomnia. Denies skin break down or rash.   PE  BP 125/73   Pulse 69   Resp 16   Ht 5\' 6"  (1.676 m)   Wt 163 lb 12.8 oz (74.3 kg)   SpO2 96%   BMI 26.44 kg/m   Patient alert and oriented and in no cardiopulmonary distress.  HEENT: No facial asymmetry, EOMI,     Neck decreased ROM .  Chest: Clear to auscultation bilaterally.decreased air entry  in bases  CVS: S1, S2 no murmurs, no S3.Regular rate.  ABD: Soft non tender.   Ext: No edema  WU:[id number] decreased  ROM spine, shoulders, hips and knees.Ambulaes with walker  Skin: Intact, no ulcerations or rash noted.  Psych: Good eye contact, normal affect. not anxious or depressed appearing.  CNS: CN 2-12  intact, power,  normal throughout.no focal deficits noted.   Assessment & Plan  Essential hypertension Controlled, no change in medication DASH diet and commitment to daily physical activity for a minimum of 30 minutes discussed and encouraged, as a part of hypertension management. The importance of attaining a healthy weight is also discussed.     11/29/2023    1:27 PM 09/04/2023    1:01 PM 07/19/2023   11:28 AM 07/11/2023   10:11 AM 06/01/2023   10:59 AM 02/08/2023   11:30 PM 02/08/2023   10:15 PM  BP/Weight  Systolic BP 125 118 128 122 112 136 138  Diastolic BP 73 78 62 62 66 97 73  Wt. (Lbs) 163.8 167.2 165 164 165.8    BMI 26.44 kg/m2 26.99 kg/m2 26.63 kg/m2 26.47 kg/m2 26.76 kg/m2         Dysuria Urine c/s only, reports malodorus urine also, will wait on c/s before absx prescribed  Hypothyroidism Updated lab needed at/ before next visit.   Vitamin D deficiency Updated lab needed at/ before next visit.   Malodorous urine Urine sent for c/s, has recurrent UTI  Hyperlipidemia Hyperlipidemia:Low fat diet discussed and encouraged.   Lipid Panel  Lab Results  Component Value Date   CHOL 141 11/27/2023   HDL 65 11/27/2023   LDLCALC 56 11/27/2023   TRIG 111 11/27/2023   CHOLHDL 2.2 11/27/2023     Controlled, no change in medication   Encounter for immunization After obtaining informed consent, the  pneumonia 20 vaccine is  administered , with no adverse effect noted at the time of administration.   At high risk for falls Home safety and fall risk reduction discussed briefly, no falls reported

## 2023-12-02 NOTE — Assessment & Plan Note (Signed)
 Hyperlipidemia:Low fat diet discussed and encouraged.   Lipid Panel  Lab Results  Component Value Date   CHOL 141 11/27/2023   HDL 65 11/27/2023   LDLCALC 56 11/27/2023   TRIG 111 11/27/2023   CHOLHDL 2.2 11/27/2023     Controlled, no change in medication

## 2023-12-02 NOTE — Assessment & Plan Note (Signed)
 Urine sent for c/s, has recurrent UTI

## 2023-12-02 NOTE — Assessment & Plan Note (Signed)
 After obtaining informed consent, the pneumonia 20  vaccine is  administered , with no adverse effect noted at the time of administration.

## 2023-12-02 NOTE — Assessment & Plan Note (Signed)
 Home safety and fall risk reduction discussed briefly, no falls reported

## 2023-12-03 LAB — URINE CULTURE

## 2023-12-03 MED ORDER — AMPICILLIN 500 MG PO CAPS: ORAL_CAPSULE | ORAL | 0 refills | Status: DC

## 2023-12-03 NOTE — Addendum Note (Signed)
 Addended by: Syliva Overman E on: 12/03/2023 11:05 AM   Modules accepted: Orders

## 2023-12-04 NOTE — Progress Notes (Signed)
 Remote pacemaker transmission.

## 2023-12-04 NOTE — Addendum Note (Signed)
 Addended by: Geralyn Flash D on: 12/04/2023 03:33 PM   Modules accepted: Orders

## 2023-12-23 ENCOUNTER — Other Ambulatory Visit: Payer: Self-pay | Admitting: Family Medicine

## 2023-12-24 MED ORDER — OMEPRAZOLE 40 MG PO CPDR: 40.0000 mg | DELAYED_RELEASE_CAPSULE | Freq: Every day | ORAL | 0 refills | Status: DC

## 2023-12-25 ENCOUNTER — Ambulatory Visit: Attending: Cardiology | Admitting: *Deleted

## 2023-12-25 DIAGNOSIS — I480 Paroxysmal atrial fibrillation: Secondary | ICD-10-CM

## 2023-12-25 DIAGNOSIS — Z5181 Encounter for therapeutic drug level monitoring: Secondary | ICD-10-CM | POA: Diagnosis not present

## 2023-12-25 LAB — POCT INR: INR: 2.4 (ref 2.0–3.0)

## 2023-12-25 NOTE — Patient Instructions (Signed)
 Continue warfarin 1/2 tablet daily except 1 tablet on Mondays  Recheck in 6 weeks Continue greens Taking Macrobid  1 tablet 3 x wk long term

## 2023-12-27 ENCOUNTER — Other Ambulatory Visit: Payer: Self-pay | Admitting: Family Medicine

## 2024-01-03 ENCOUNTER — Encounter: Payer: Self-pay | Admitting: Family Medicine

## 2024-01-03 ENCOUNTER — Other Ambulatory Visit: Payer: Self-pay

## 2024-01-03 MED ORDER — NITROFURANTOIN MONOHYD MACRO 100 MG PO CAPS: ORAL_CAPSULE | ORAL | 5 refills | Status: DC

## 2024-01-23 ENCOUNTER — Encounter: Payer: Self-pay | Admitting: Family Medicine

## 2024-01-23 ENCOUNTER — Other Ambulatory Visit: Payer: Self-pay

## 2024-01-23 MED ORDER — BUSPIRONE HCL 7.5 MG PO TABS: ORAL_TABLET | ORAL | 2 refills | Status: DC

## 2024-01-29 ENCOUNTER — Ambulatory Visit (INDEPENDENT_AMBULATORY_CARE_PROVIDER_SITE_OTHER): Payer: Medicare Other

## 2024-01-29 DIAGNOSIS — I495 Sick sinus syndrome: Secondary | ICD-10-CM | POA: Diagnosis not present

## 2024-01-29 LAB — CUP PACEART REMOTE DEVICE CHECK
Battery Remaining Longevity: 48 mo
Battery Remaining Percentage: 42 %
Battery Voltage: 2.96 V
Brady Statistic RV Percent Paced: 61 %
Date Time Interrogation Session: 20250603025410
Implantable Lead Connection Status: 753985
Implantable Lead Connection Status: 753985
Implantable Lead Implant Date: 20110422
Implantable Lead Implant Date: 20110422
Implantable Lead Location: 753859
Implantable Lead Location: 753860
Implantable Pulse Generator Implant Date: 20180319
Lead Channel Impedance Value: 410 Ohm
Lead Channel Pacing Threshold Amplitude: 1 V
Lead Channel Pacing Threshold Pulse Width: 0.5 ms
Lead Channel Sensing Intrinsic Amplitude: 9.3 mV
Lead Channel Setting Pacing Amplitude: 2 V
Lead Channel Setting Pacing Pulse Width: 0.5 ms
Lead Channel Setting Sensing Sensitivity: 1 mV
Pulse Gen Model: 2272
Pulse Gen Serial Number: 8010902

## 2024-01-31 ENCOUNTER — Ambulatory Visit: Payer: Self-pay | Admitting: Internal Medicine

## 2024-02-05 ENCOUNTER — Ambulatory Visit: Attending: Cardiology | Admitting: *Deleted

## 2024-02-05 DIAGNOSIS — Z5181 Encounter for therapeutic drug level monitoring: Secondary | ICD-10-CM

## 2024-02-05 DIAGNOSIS — I480 Paroxysmal atrial fibrillation: Secondary | ICD-10-CM | POA: Diagnosis not present

## 2024-02-05 LAB — POCT INR: INR: 2.6 (ref 2.0–3.0)

## 2024-02-05 NOTE — Patient Instructions (Signed)
 Continue warfarin 1/2 tablet daily except 1 tablet on Mondays  Recheck in 6 weeks Continue greens Taking Macrobid  1 tablet 3 x wk long term

## 2024-02-06 ENCOUNTER — Encounter: Payer: Self-pay | Admitting: Family Medicine

## 2024-02-06 ENCOUNTER — Other Ambulatory Visit: Payer: Self-pay

## 2024-02-06 MED ORDER — DONEPEZIL HCL 10 MG PO TABS: 10.0000 mg | ORAL_TABLET | Freq: Every day | ORAL | 0 refills | Status: DC

## 2024-02-06 NOTE — Telephone Encounter (Signed)
 Refills sent to pharmacy.

## 2024-02-19 ENCOUNTER — Other Ambulatory Visit: Payer: Self-pay | Admitting: Cardiology

## 2024-03-17 ENCOUNTER — Ambulatory Visit: Payer: Self-pay

## 2024-03-17 NOTE — Telephone Encounter (Signed)
 FYI Only or Action Required?: FYI only for provider.  Patient was last seen in primary care on 11/29/2023 by [name], MD.  Called Nurse Triage reporting Abdominal Pain, Nausea, and Anorexia.  Symptoms began several days ago.  Interventions attempted: Rest, hydration, or home remedies.  Symptoms are: unchanged.  Triage Disposition: See Physician Within 24 Hours  Patient/caregiver understands and will follow disposition?: Yes      Copied from CRM [id number]. Topic: Clinical - Red Word Triage >> Mar 17, 2024  3:44 PM [name] wrote: Red Word that prompted transfer to Nurse Triage: Patient's daughter, [name], called to report that patient is not feeling well with nausea, pain in her stomach, no appetite, and chills.  Not known if she has Underwood fever. Reason for Disposition  [1] MODERATE weakness (e.g., interferes with work, school, normal activities) AND [2] persists > 3 days  Additional Information  Commented on: Answer Assessment    Scheduled patient on the next available acute appt on March 19, 2024 with alternate PCP per caregiver preference .  Answer Assessment - Initial Assessment Questions 1. LOCATION: Where does it hurt?      Just rubs across her stomach 2. RADIATION: Does the pain shoot anywhere else? (e.g., chest, back)     Back - last night rubbed across back 3. ONSET: When did the pain begin? (e.g., minutes, hours or days ago)      Friday 4. SUDDEN: Gradual or sudden onset?     sudden 5. PATTERN Does the pain come and go, or is it constant?     consistent 6. SEVERITY: How bad is the pain?  (e.g., Scale 1-10; mild, moderate, or severe)     unknown 7. RECURRENT SYMPTOM: Have you ever had this type of stomach pain before? If Yes, ask: When was the last time? and What happened that time?      [name] [name] reports hx of diverticulitis and UTI 8. CAUSE: What do you think is causing the stomach pain? (e.g., gallstones, recent abdominal surgery)      See above 9. RELIEVING/AGGRAVATING FACTORS: What makes it better or worse? (e.g., antacids, bending or twisting motion, bowel movement)     denies 10. OTHER SYMPTOMS: Do you have any other symptoms? (e.g., back pain, diarrhea, fever, urination pain, vomiting)       Nausea, poor appetite [name] endorses foul urine/dark urine 11. PREGNANCY: Is there any chance you are pregnant? When was your last menstrual period?       N/Underwood  Protocols used: Abdominal Pain - Female-Underwood-AH, Weakness (Generalized) and Fatigue-Underwood-AH

## 2024-03-18 ENCOUNTER — Encounter

## 2024-03-19 ENCOUNTER — Other Ambulatory Visit: Payer: Self-pay | Admitting: Family Medicine

## 2024-03-19 ENCOUNTER — Ambulatory Visit (INDEPENDENT_AMBULATORY_CARE_PROVIDER_SITE_OTHER): Admitting: Internal Medicine

## 2024-03-19 ENCOUNTER — Encounter: Payer: Self-pay | Admitting: Internal Medicine

## 2024-03-19 VITALS — BP 121/61 | HR 94 | Ht 66.0 in | Wt 160.0 lb

## 2024-03-19 DIAGNOSIS — K5909 Other constipation: Secondary | ICD-10-CM | POA: Insufficient documentation

## 2024-03-19 DIAGNOSIS — K219 Gastro-esophageal reflux disease without esophagitis: Secondary | ICD-10-CM

## 2024-03-19 DIAGNOSIS — N390 Urinary tract infection, site not specified: Secondary | ICD-10-CM

## 2024-03-19 DIAGNOSIS — N184 Chronic kidney disease, stage 4 (severe): Secondary | ICD-10-CM | POA: Diagnosis not present

## 2024-03-19 MED ORDER — AMOXICILLIN-POT CLAVULANATE 500-125 MG PO TABS: 1.0000 | ORAL_TABLET | Freq: Two times a day (BID) | ORAL | 0 refills | Status: DC

## 2024-03-19 NOTE — Progress Notes (Signed)
 Acute Office Visit  Subjective:    Patient ID: [name], female    DOB: 02/29/1926, 89 y.o.   MRN: [id number]  Chief Complaint  Patient presents with   Fatigue    Pt reports feeling nauseous , no appetite, has been feeling this way since Friday.     HPI Patient is in today for evaluation for decreased appetite and fatigue for the last 5 days.  Her daughter is present during the visit today and provides most of the history due to patient's cognitive decline and hearing difficulty.  Daughter reports that patient has been feeling weak and slightly more confused than her baseline.  She has a history of recurrent UTI, and takes Macrobid  3 times in a week.  She currently does not report dysuria or hematuria, but her daughter reports that she never has those complaints even when she has UTI.  She reports nausea, but denies any epigastric pain.  She takes omeprazole  for GERD.  She has chronic constipation, last BM about 2 days ago and reports straining while passing stool.  Denies any melena or hematochezia.  Past Medical History:  Diagnosis Date   Allergy    Annual physical exam 04/22/2015   Anxiety    Arthritis    Arthritis    Asthma    Atrial fibrillation (HCC)    Bleeding disorder (HCC)    CHF (congestive heart failure) (HCC)    CKD (chronic kidney disease) stage 4, GFR 15-29 ml/min (HCC) 09/08/2021   COPD (chronic obstructive pulmonary disease) (HCC)    Depression    Gastroesophageal reflux disease    Hearing impairment    Heart disease    Heart murmur    High cholesterol    Hyperlipidemia    Hypertension    Hypothyroidism    Impaired glucose tolerance    Macular degeneration    Oxygen  deficiency    Pancreatitis    Paroxysmal atrial fibrillation (HCC) 2011   Sick sinus syndrome (HCC) 2011   Atrial fibrilation 10/2009; and dual chamber pacemaker in 4/11; normal EF   Sleep apnea    Nocturnal oxygen  therapy   Zoster 2016    Past Surgical History:  Procedure  Laterality Date   ABDOMINAL HYSTERECTOMY     CATARACT EXTRACTION     Bilateral   COLONOSCOPY  08/29/2007   Dr. Shaaron   EYE SURGERY     laser    PACEMAKER INSERTION  08/28/2009   dual chamber    PPM GENERATOR CHANGEOUT N/A 11/13/2016   Procedure: PPM Generator Changeout;  Surgeon: Waddell Danelle ORN, MD;  Location: [facility];  Service: Cardiovascular;  Laterality: N/A;   SALPINGOOPHORECTOMY  08/28/1968   right    Family History  Problem Relation Age of Onset   Cancer Mother        gential   Cancer Father        throat    Pneumonia Sister    Emphysema Sister    Mitral valve prolapse Sister    Heart disease Other    Arthritis Other    Lung disease Other    Asthma Other    Melanoma Son    Anxiety disorder Daughter    Arthritis Daughter    Asthma Daughter    COPD Daughter    Depression Daughter    Kidney disease Daughter    Obesity Daughter    Anxiety disorder Daughter    Anxiety disorder Daughter    Arthritis Daughter  Social History   Socioeconomic History   Marital status: Widowed    Spouse name: Not on file   Number of children: 7   Years of education: college   Highest education level: Not on file  Occupational History   Occupation: retired     Associate Professor: RETIRED  Tobacco Use   Smoking status: Never    Passive exposure: Never   Smokeless tobacco: Never  Vaping Use   Vaping status: Never Used  Substance and Sexual Activity   Alcohol use: No   Drug use: No   Sexual activity: Not Currently    Birth control/protection: Surgical    Comment: hyst  Other Topics Concern   Not on file  Social History Narrative   Not on file   Social Drivers of Health   Financial Resource Strain: Low Risk  (09/04/2023)   Overall Financial Resource Strain (CARDIA)    Difficulty of Paying Living Expenses: Not hard at all  Food Insecurity: No Food Insecurity (09/04/2023)   Hunger Vital Sign    Worried About Running Out of Food in the Last Year: Never true    Ran  Out of Food in the Last Year: Never true  Transportation Needs: No Transportation Needs (09/04/2023)   PRAPARE - Administrator, Civil Service (Medical): No    Lack of Transportation (Non-Medical): No  Physical Activity: Inactive (09/04/2023)   Exercise Vital Sign    Days of Exercise per Week: 0 days    Minutes of Exercise per Session: 0 min  Stress: No Stress Concern Present (09/04/2023)   [employer] of Occupational Health - Occupational Stress Questionnaire    Feeling of Stress : Not at all  Social Connections: Socially Isolated (09/04/2023)   Social Connection and Isolation Panel    Frequency of Communication with Friends and Family: More than three times a week    Frequency of Social Gatherings with Friends and Family: Once a week    Attends Religious Services: Never    Database administrator or Organizations: No    Attends Banker Meetings: Never    Marital Status: Widowed  Intimate Partner Violence: Not At Risk (09/04/2023)   Humiliation, Afraid, Rape, and Kick questionnaire    Fear of Current or Ex-Partner: No    Emotionally Abused: No    Physically Abused: No    Sexually Abused: No    Outpatient Medications Prior to Visit  Medication Sig Dispense Refill   Ascorbic Acid (VITAMIN C PO) Take 1 tablet by mouth daily.     busPIRone  (BUSPAR ) 7.5 MG tablet TAKE (1) TABLET BY MOUTH TWICE DAILY. 60 tablet 2   Cholecalciferol (VITAMIN D3 PO) Take 2 tablets by mouth daily.     clotrimazole -betamethasone  (LOTRISONE ) cream Apply to affected area(s) twice daily as needed, for 5 to 7 days 45 g 0   Cyanocobalamin (VITAMIN B12 PO) Take 1 tablet by mouth daily.     diltiazem  (CARDIZEM  CD) 300 MG 24 hr capsule TAKE ONE CAPSULE BY MOUTH ONCE DAILY. 90 capsule 3   donepezil  (ARICEPT ) 10 MG tablet Take 1 tablet (10 mg total) by mouth at bedtime. 90 tablet 0   fluticasone  (FLONASE ) 50 MCG/ACT nasal spray Place 1 spray into both nostrils daily.     levothyroxine  (SYNTHROID )  112 MCG tablet Take 1 tablet (112 mcg total) by mouth daily. 90 tablet 0   loratadine  (CLARITIN ) 10 MG tablet Take 1 tablet (10 mg total) by mouth daily. 30 tablet 1  lovastatin  (MEVACOR ) 40 MG tablet Take 1 tablet (40 mg total) by mouth at bedtime. 90 tablet 2   meclizine  (ANTIVERT ) 12.5 MG tablet Take 1 tablet (12.5 mg total) by mouth 2 (two) times daily as needed for dizziness. 30 tablet 0   metoprolol  tartrate (LOPRESSOR ) 100 MG tablet Take 1 tablet (100 mg total) by mouth 2 (two) times daily. 180 tablet 0   metoprolol  tartrate (LOPRESSOR ) 25 MG tablet Take 1 tablet (25 mg total) by mouth 2 (two) times daily. 180 tablet 3   Multiple Vitamins-Minerals (EYE VITAMINS & MINERALS PO) Take 1 tablet by mouth daily.     nitrofurantoin , macrocrystal-monohydrate, (MACROBID ) 100 MG capsule TAKE 1 CAPSULE BY MOUTH EVERY MONDAY, WEDNESDAY AND FRIDAY 12 capsule 5   omeprazole  (PRILOSEC) 40 MG capsule Take 1 capsule (40 mg total) by mouth daily. 90 capsule 0   OXYGEN -HELIUM IN Inhale 2 L into the lungs at bedtime.     torsemide  (DEMADEX ) 20 MG tablet Take 1 tablet (20 mg total) by mouth daily. 90 tablet 3   warfarin (COUMADIN ) 5 MG tablet TAKE 1/2 TABLET TO 1 TABLET BY MOUTH DAILY OR AS DIRECTED BY COUMADIN  CLINIC 30 tablet 5   zinc gluconate 50 MG tablet Take 50 mg by mouth daily.     ampicillin  (PRINCIPEN) 500 MG capsule TAKE ONE CAPSULE BY MOUTH THREE TIMES DAILY FOR 1 WEEK ONLY.DO NOT TAKE NITROFURANTOIN  DURING THIS WEEK 21 capsule 0   Facility-Administered Medications Prior to Visit  Medication Dose Route Frequency Provider Last Rate Last Admin   miconazole  (MONISTAT  7) 2 % vaginal cream 1 Applicatorful  1 Applicatorful Vaginal QHS Edsel Norleen GAILS, MD        No Known Allergies  Review of Systems  Constitutional:  Positive for fatigue. Negative for chills and fever.  HENT:  Negative for congestion and sore throat.   Eyes:  Negative for pain and discharge.  Respiratory:  Negative for cough and  shortness of breath.   Cardiovascular:  Negative for chest pain and palpitations.  Gastrointestinal:  Positive for constipation and nausea. Negative for diarrhea and vomiting.  Endocrine: Negative for polydipsia and polyuria.  Genitourinary:  Negative for dysuria and hematuria.  Musculoskeletal:  Positive for back pain and gait problem. Negative for neck pain and neck stiffness.  Skin:  Negative for rash.  Neurological:  Positive for weakness. Negative for dizziness.  Psychiatric/Behavioral:  Positive for confusion. Negative for agitation and behavioral problems.        Objective:    Physical Exam Vitals reviewed.  Constitutional:      General: She is not in acute distress.    Appearance: She is not diaphoretic.  HENT:     Head: Normocephalic and atraumatic.     Nose: Nose normal.     Mouth/Throat:     Mouth: Mucous membranes are moist.  Eyes:     General: No scleral icterus.    Extraocular Movements: Extraocular movements intact.  Cardiovascular:     Rate and Rhythm: Normal rate and regular rhythm.     Heart sounds: Normal heart sounds. No murmur heard. Pulmonary:     Breath sounds: Normal breath sounds. No wheezing or rales.  Abdominal:     Palpations: Abdomen is soft.     Tenderness: There is no abdominal tenderness.  Musculoskeletal:     Right lower leg: No edema.     Left lower leg: No edema.  Skin:    General: Skin is warm.  Findings: No rash.  Neurological:     General: No focal deficit present.     Mental Status: She is alert. Mental status is at baseline.     Motor: Weakness (B/l LE - 3/5) present.     BP 121/61   Pulse 94   Ht 5' 6 (1.676 m)   Wt 160 lb (72.6 kg)   SpO2 100%   BMI 25.82 kg/m  Wt Readings from Last 3 Encounters:  03/19/24 160 lb (72.6 kg)  11/29/23 163 lb 12.8 oz (74.3 kg)  09/04/23 167 lb 3.2 oz (75.8 kg)        Assessment & Plan:   Problem List Items Addressed This Visit       Digestive   Gastroesophageal reflux  disease   Overall well-controlled with omeprazole  40 mg QD If she has persistent nausea and epigastric discomfort, may switch her PPI      Chronic constipation   Advised to take Senokot-S every other day Advised to maintain adequate hydration Can try prune juice as needed for constipation        Genitourinary   Recurrent UTI - Primary   Check urine culture Started empiric Augmentin  considering her history of E. coli UTI Advised to hold Macrobid  for now until completing Augmentin  Advised to maintain adequate hydration      Relevant Medications   amoxicillin -clavulanate (AUGMENTIN ) 500-125 MG tablet   Other Relevant Orders   Urine Culture   CKD (chronic kidney disease) stage 4, GFR 15-29 ml/min (HCC)   Last BMP reviewed Followed by nephrology On torsemide  20 mg once daily If persistent fatigue, will check BMP and CBC        Meds ordered this encounter  Medications   amoxicillin -clavulanate (AUGMENTIN ) 500-125 MG tablet    Sig: Take 1 tablet by mouth 2 (two) times daily.    Dispense:  10 tablet    Refill:  0     Zuma Hust MARLA Blanch, MD

## 2024-03-19 NOTE — Assessment & Plan Note (Signed)
 Overall well-controlled with omeprazole  40 mg QD If she has persistent nausea and epigastric discomfort, may switch her PPI

## 2024-03-19 NOTE — Assessment & Plan Note (Signed)
 Last BMP reviewed Followed by nephrology On torsemide  20 mg once daily If persistent fatigue, will check BMP and CBC

## 2024-03-19 NOTE — Patient Instructions (Addendum)
 Please start taking Augmentin  as prescribed.  Please do not take Macrobid  while taking Augmentin .  Please maintain at least 50 ounces of fluid intake in a day and eat at regular intervals.  Please take Senokot-S every other day for constipation for now.

## 2024-03-19 NOTE — Assessment & Plan Note (Signed)
 Advised to take Senokot-S every other day Advised to maintain adequate hydration Can try prune juice as needed for constipation

## 2024-03-19 NOTE — Assessment & Plan Note (Signed)
 Check urine culture Started empiric Augmentin  considering her history of E. coli UTI Advised to hold Macrobid  for now until completing Augmentin  Advised to maintain adequate hydration

## 2024-03-20 DIAGNOSIS — N390 Urinary tract infection, site not specified: Secondary | ICD-10-CM | POA: Diagnosis not present

## 2024-03-23 NOTE — Progress Notes (Unsigned)
 Cardiology Office Note    Date:  03/26/2024  ID:  Anasophia, Pecor 1925/10/10, MRN [id number] Cardiologist: Alvan Carrier, MD Cardiology APP:  Johnson Laymon CHRISTELLA, PA-C  Electrophysiologist:  Danelle Birmingham, MD {   History of Present Illness:    [name] is a 89 y.o. female  with past medical history of paroxysmal atrial fibrillation, symptomatic bradycardia (s/p PPM placement with gen-change in 10/2016), lower extremity edema, HTN, HLD, GERD, recurrent UTI's, and Stage 3 CKD who presents to the office today for 31-month follow-up.  She was last examined by Dr. Alvan in 06/2023 and denied any recent chest pain or palpitations at that time. She had continued to prefer Coumadin  over NOACs and was continued on this. She reported intermittent lower extremity edema and was continued on Torsemide  20 mg daily. She did follow-up with Dr. Birmingham later that month and her device was functioning normally. No changes were made at that time.  In talking with the patient and her daughter today, she reports having a decreased appetite since 03/14/2024. Was treated for a UTI and the patient feels that this worsened while on antibiotics but she has now completed her course of these. Has intermittent diarrhea and constipation but no persistent symptoms. No abdominal pain. She denies any recent chest pain or palpitations. No specific orthopnea or PND. Has noticed some lower extremity edema. She has been consuming more soup given her change in appetite. Family reports she consumes very little water, usually less than one 16-ounce bottle per day.    Studies Reviewed:   EKG: EKG is not ordered today.   Echocardiogram: 06/2020 IMPRESSIONS     1. Left ventricular ejection fraction, by estimation, is 55 to 60%. The  left ventricle has normal function. The left ventricle has no regional  wall motion abnormalities. Left ventricular diastolic parameters are  indeterminate.   2. Right ventricular systolic  function is low normal. The right  ventricular size is mildly enlarged. There is normal pulmonary artery  systolic pressure.   3. Left atrial size was mildly dilated.   4. Right atrial size was mild to moderately dilated.   5. The mitral valve is normal in structure. Mild mitral valve  regurgitation. No evidence of mitral stenosis.   6. Tricuspid valve regurgitation is mild to moderate.   7. The aortic valve is tricuspid. There is moderate calcification of the  aortic valve. There is moderate thickening of the aortic valve. Aortic  valve regurgitation is not visualized. No aortic stenosis is present.   Physical Exam:   VS:  BP 118/60 (BP Location: Right Arm, Patient Position: Sitting, Cuff Size: Normal)   Pulse 71   Ht 5' 6 (1.676 m)   Wt 160 lb (72.6 kg) Comment: per home scale  SpO2 96%   BMI 25.82 kg/m    Wt Readings from Last 3 Encounters:  03/26/24 160 lb (72.6 kg)  03/19/24 160 lb (72.6 kg)  11/29/23 163 lb 12.8 oz (74.3 kg)     GEN: Pleasant, elderly female appearing in no acute distress.  NECK: No JVD; No carotid bruits CARDIAC: Irregularly irregular, no murmurs, rubs, gallops RESPIRATORY:  Clear to auscultation without rales, wheezing or rhonchi  ABDOMEN: Appears non-distended. No obvious abdominal masses. Not tender to palpation.  EXTREMITIES: No clubbing or cyanosis. Trace lower extremity edema bilaterally.  Distal pedal pulses are 2+ bilaterally.   Assessment and Plan:   1. Paroxysmal atrial fibrillation (HCC) - She denies any recent palpitations and heart  rate is well-controlled in the 70's during today's visit. Continue Cardizem  CD 300 mg daily and Lopressor  125mg  twice daily for rate-control. - She is on Coumadin  for anticoagulation and INR was at 2.6 when checked in 11/2023.  2. Sick sinus syndrome due to sinoatrial node dysfunction (HCC) - Her device is followed by Dr. Waddell. Most recent device interrogation in 01/2024 showed normal device function.  3.  Leg edema - She does have trace lower extremity edema on examination today. She has been consuming more soup as outlined above in the setting of her variable appetite and we reviewed the importance of limiting sodium intake. - Continue Torsemide  20 mg daily for now. Will recheck a BMET today as I am concerned she could be dehydrated in the setting of decreased PO intake. Creatinine was at 1.68 when checked in 11/2023 which is close to her known baseline.  Addendum: Repeat labs show her creatinine has trended up to 2.31. Recommended reducing Torsemide  to 20 mg every other day and trying to increase intake of water. Repeat BMET in 7-10 days. If renal function has worsened, may need to change to PRN dosing.   4. Essential hypertension - BP is well-controlled at 118/60 during today's visit. Continue current medical therapy with Cardizem  CD 300 mg daily and Lopressor  125 mg twice daily.  5. Mixed hyperlipidemia - FLP in 11/2023 showed total cholesterol 141, triglycerides 111, HDL 65 and LDL 56. Continue current medical therapy with Lovastatin  40mg  daily.   6. Decreased Appetite - The patient and her family report that she has experienced a decreased appetite and was recently treated for a UTI and she feels that symptoms are possibly due to being on antibiotics but her family reports this was occurring prior to this. No reported abdominal pain or tenderness on examination. Will recheck a CBC and BMET today. I encouraged them to reach out to her PCP if symptoms do not improve as she may require abdominal imaging.   Signed, Laymon CHRISTELLA Qua, PA-C

## 2024-03-24 LAB — URINE CULTURE

## 2024-03-25 NOTE — Progress Notes (Signed)
 Remote pacemaker transmission.

## 2024-03-26 ENCOUNTER — Other Ambulatory Visit: Payer: Self-pay

## 2024-03-26 ENCOUNTER — Encounter: Payer: Self-pay | Admitting: Family Medicine

## 2024-03-26 ENCOUNTER — Emergency Department (HOSPITAL_COMMUNITY)

## 2024-03-26 ENCOUNTER — Other Ambulatory Visit (HOSPITAL_COMMUNITY)
Admission: RE | Admit: 2024-03-26 | Discharge: 2024-03-26 | Disposition: A | Discharge status: Home / Self Care | Source: Ambulatory Visit | Attending: Student | Admitting: Student

## 2024-03-26 ENCOUNTER — Encounter: Payer: Self-pay | Admitting: Student

## 2024-03-26 ENCOUNTER — Inpatient Hospital Stay (HOSPITAL_COMMUNITY)
Admission: EM | Admit: 2024-03-26 | Discharge: 2024-04-03 | DRG: 445 | Disposition: A | Discharge status: Skilled Nursing Facility | Attending: Family Medicine | Admitting: Family Medicine

## 2024-03-26 ENCOUNTER — Ambulatory Visit: Payer: Self-pay | Admitting: Student

## 2024-03-26 ENCOUNTER — Ambulatory Visit: Attending: Student | Admitting: Student

## 2024-03-26 VITALS — BP 118/60 | HR 71 | Ht 66.0 in | Wt 160.0 lb

## 2024-03-26 DIAGNOSIS — R531 Weakness: Secondary | ICD-10-CM | POA: Diagnosis present

## 2024-03-26 DIAGNOSIS — E782 Mixed hyperlipidemia: Secondary | ICD-10-CM | POA: Diagnosis present

## 2024-03-26 DIAGNOSIS — K810 Acute cholecystitis: Secondary | ICD-10-CM | POA: Diagnosis not present

## 2024-03-26 DIAGNOSIS — N3000 Acute cystitis without hematuria: Principal | ICD-10-CM

## 2024-03-26 DIAGNOSIS — Z604 Social exclusion and rejection: Secondary | ICD-10-CM | POA: Diagnosis present

## 2024-03-26 DIAGNOSIS — Z825 Family history of asthma and other chronic lower respiratory diseases: Secondary | ICD-10-CM | POA: Diagnosis not present

## 2024-03-26 DIAGNOSIS — K5730 Diverticulosis of large intestine without perforation or abscess without bleeding: Secondary | ICD-10-CM | POA: Diagnosis not present

## 2024-03-26 DIAGNOSIS — K838 Other specified diseases of biliary tract: Secondary | ICD-10-CM | POA: Diagnosis not present

## 2024-03-26 DIAGNOSIS — K8021 Calculus of gallbladder without cholecystitis with obstruction: Principal | ICD-10-CM | POA: Diagnosis present

## 2024-03-26 DIAGNOSIS — J449 Chronic obstructive pulmonary disease, unspecified: Secondary | ICD-10-CM | POA: Diagnosis present

## 2024-03-26 DIAGNOSIS — Z66 Do not resuscitate: Secondary | ICD-10-CM | POA: Diagnosis not present

## 2024-03-26 DIAGNOSIS — K828 Other specified diseases of gallbladder: Secondary | ICD-10-CM | POA: Diagnosis not present

## 2024-03-26 DIAGNOSIS — E039 Hypothyroidism, unspecified: Secondary | ICD-10-CM | POA: Diagnosis present

## 2024-03-26 DIAGNOSIS — Z9841 Cataract extraction status, right eye: Secondary | ICD-10-CM

## 2024-03-26 DIAGNOSIS — E7849 Other hyperlipidemia: Secondary | ICD-10-CM | POA: Diagnosis not present

## 2024-03-26 DIAGNOSIS — K219 Gastro-esophageal reflux disease without esophagitis: Secondary | ICD-10-CM | POA: Diagnosis present

## 2024-03-26 DIAGNOSIS — I5032 Chronic diastolic (congestive) heart failure: Secondary | ICD-10-CM | POA: Diagnosis present

## 2024-03-26 DIAGNOSIS — R600 Localized edema: Secondary | ICD-10-CM

## 2024-03-26 DIAGNOSIS — I480 Paroxysmal atrial fibrillation: Secondary | ICD-10-CM | POA: Diagnosis present

## 2024-03-26 DIAGNOSIS — D631 Anemia in chronic kidney disease: Secondary | ICD-10-CM | POA: Diagnosis present

## 2024-03-26 DIAGNOSIS — I130 Hypertensive heart and chronic kidney disease with heart failure and stage 1 through stage 4 chronic kidney disease, or unspecified chronic kidney disease: Secondary | ICD-10-CM | POA: Diagnosis present

## 2024-03-26 DIAGNOSIS — E611 Iron deficiency: Secondary | ICD-10-CM | POA: Diagnosis present

## 2024-03-26 DIAGNOSIS — N179 Acute kidney failure, unspecified: Secondary | ICD-10-CM | POA: Diagnosis present

## 2024-03-26 DIAGNOSIS — E7800 Pure hypercholesterolemia, unspecified: Secondary | ICD-10-CM | POA: Diagnosis present

## 2024-03-26 DIAGNOSIS — I495 Sick sinus syndrome: Secondary | ICD-10-CM

## 2024-03-26 DIAGNOSIS — I499 Cardiac arrhythmia, unspecified: Secondary | ICD-10-CM | POA: Diagnosis not present

## 2024-03-26 DIAGNOSIS — F419 Anxiety disorder, unspecified: Secondary | ICD-10-CM | POA: Diagnosis present

## 2024-03-26 DIAGNOSIS — Z950 Presence of cardiac pacemaker: Secondary | ICD-10-CM

## 2024-03-26 DIAGNOSIS — I4891 Unspecified atrial fibrillation: Secondary | ICD-10-CM | POA: Diagnosis not present

## 2024-03-26 DIAGNOSIS — R748 Abnormal levels of other serum enzymes: Secondary | ICD-10-CM | POA: Diagnosis not present

## 2024-03-26 DIAGNOSIS — Z79899 Other long term (current) drug therapy: Secondary | ICD-10-CM | POA: Diagnosis not present

## 2024-03-26 DIAGNOSIS — R791 Abnormal coagulation profile: Secondary | ICD-10-CM | POA: Diagnosis not present

## 2024-03-26 DIAGNOSIS — N185 Chronic kidney disease, stage 5: Secondary | ICD-10-CM | POA: Diagnosis present

## 2024-03-26 DIAGNOSIS — E038 Other specified hypothyroidism: Secondary | ICD-10-CM | POA: Diagnosis not present

## 2024-03-26 DIAGNOSIS — G4700 Insomnia, unspecified: Secondary | ICD-10-CM | POA: Diagnosis present

## 2024-03-26 DIAGNOSIS — I700 Atherosclerosis of aorta: Secondary | ICD-10-CM | POA: Diagnosis not present

## 2024-03-26 DIAGNOSIS — Z743 Need for continuous supervision: Secondary | ICD-10-CM | POA: Diagnosis not present

## 2024-03-26 DIAGNOSIS — K831 Obstruction of bile duct: Secondary | ICD-10-CM | POA: Diagnosis present

## 2024-03-26 DIAGNOSIS — R630 Anorexia: Secondary | ICD-10-CM

## 2024-03-26 DIAGNOSIS — N184 Chronic kidney disease, stage 4 (severe): Secondary | ICD-10-CM | POA: Diagnosis present

## 2024-03-26 DIAGNOSIS — I10 Essential (primary) hypertension: Secondary | ICD-10-CM

## 2024-03-26 DIAGNOSIS — Z79890 Hormone replacement therapy (postmenopausal): Secondary | ICD-10-CM

## 2024-03-26 DIAGNOSIS — E785 Hyperlipidemia, unspecified: Secondary | ICD-10-CM | POA: Diagnosis present

## 2024-03-26 DIAGNOSIS — Z9181 History of falling: Secondary | ICD-10-CM

## 2024-03-26 DIAGNOSIS — F32A Depression, unspecified: Secondary | ICD-10-CM | POA: Diagnosis present

## 2024-03-26 DIAGNOSIS — Z87440 Personal history of urinary (tract) infections: Secondary | ICD-10-CM

## 2024-03-26 DIAGNOSIS — Z7901 Long term (current) use of anticoagulants: Secondary | ICD-10-CM | POA: Diagnosis not present

## 2024-03-26 DIAGNOSIS — N281 Cyst of kidney, acquired: Secondary | ICD-10-CM | POA: Diagnosis not present

## 2024-03-26 DIAGNOSIS — Z9842 Cataract extraction status, left eye: Secondary | ICD-10-CM

## 2024-03-26 LAB — CBC WITH DIFFERENTIAL/PLATELET
Abs Immature Granulocytes: 0.11 K/uL — ABNORMAL HIGH (ref 0.00–0.07)
Basophils Absolute: 0.1 K/uL (ref 0.0–0.1)
Basophils Relative: 0 %
Eosinophils Absolute: 0 K/uL (ref 0.0–0.5)
Eosinophils Relative: 0 %
HCT: 31.6 % — ABNORMAL LOW (ref 36.0–46.0)
Hemoglobin: 10.2 g/dL — ABNORMAL LOW (ref 12.0–15.0)
Immature Granulocytes: 1 %
Lymphocytes Relative: 4 %
Lymphs Abs: 0.7 K/uL (ref 0.7–4.0)
MCH: 30.3 pg (ref 26.0–34.0)
MCHC: 32.3 g/dL (ref 30.0–36.0)
MCV: 93.8 fL (ref 80.0–100.0)
Monocytes Absolute: 0.8 K/uL (ref 0.1–1.0)
Monocytes Relative: 4 %
Neutro Abs: 18.2 K/uL — ABNORMAL HIGH (ref 1.7–7.7)
Neutrophils Relative %: 91 %
Platelets: 421 K/uL — ABNORMAL HIGH (ref 150–400)
RBC: 3.37 MIL/uL — ABNORMAL LOW (ref 3.87–5.11)
RDW: 13.5 % (ref 11.5–15.5)
WBC: 19.9 K/uL — ABNORMAL HIGH (ref 4.0–10.5)
nRBC: 0 % (ref 0.0–0.2)

## 2024-03-26 LAB — COMPREHENSIVE METABOLIC PANEL WITH GFR
ALT: 173 U/L — ABNORMAL HIGH (ref 0–44)
AST: 383 U/L — ABNORMAL HIGH (ref 15–41)
Albumin: 3.1 g/dL — ABNORMAL LOW (ref 3.5–5.0)
Alkaline Phosphatase: 514 U/L — ABNORMAL HIGH (ref 38–126)
Anion gap: 15 (ref 5–15)
BUN: 30 mg/dL — ABNORMAL HIGH (ref 8–23)
CO2: 22 mmol/L (ref 22–32)
Calcium: 8.8 mg/dL — ABNORMAL LOW (ref 8.9–10.3)
Chloride: 99 mmol/L (ref 98–111)
Creatinine, Ser: 2.28 mg/dL — ABNORMAL HIGH (ref 0.44–1.00)
GFR, Estimated: 19 mL/min — ABNORMAL LOW (ref >60)
Glucose, Bld: 139 mg/dL — ABNORMAL HIGH (ref 70–99)
Potassium: 3.5 mmol/L (ref 3.5–5.1)
Sodium: 136 mmol/L (ref 135–145)
Total Bilirubin: 1.8 mg/dL — ABNORMAL HIGH (ref 0.0–1.2)
Total Protein: 7.1 g/dL (ref 6.5–8.1)

## 2024-03-26 LAB — CBC
HCT: 31.7 % — ABNORMAL LOW (ref 36.0–46.0)
Hemoglobin: 10.1 g/dL — ABNORMAL LOW (ref 12.0–15.0)
MCH: 30.1 pg (ref 26.0–34.0)
MCHC: 31.9 g/dL (ref 30.0–36.0)
MCV: 94.6 fL (ref 80.0–100.0)
Platelets: 404 K/uL — ABNORMAL HIGH (ref 150–400)
RBC: 3.35 MIL/uL — ABNORMAL LOW (ref 3.87–5.11)
RDW: 13.3 % (ref 11.5–15.5)
WBC: 10.9 K/uL — ABNORMAL HIGH (ref 4.0–10.5)
nRBC: 0 % (ref 0.0–0.2)

## 2024-03-26 LAB — BASIC METABOLIC PANEL WITH GFR
Anion gap: 11 (ref 5–15)
BUN: 29 mg/dL — ABNORMAL HIGH (ref 8–23)
CO2: 25 mmol/L (ref 22–32)
Calcium: 9 mg/dL (ref 8.9–10.3)
Chloride: 101 mmol/L (ref 98–111)
Creatinine, Ser: 2.31 mg/dL — ABNORMAL HIGH (ref 0.44–1.00)
GFR, Estimated: 19 mL/min — ABNORMAL LOW (ref >60)
Glucose, Bld: 95 mg/dL (ref 70–99)
Potassium: 3.7 mmol/L (ref 3.5–5.1)
Sodium: 137 mmol/L (ref 135–145)

## 2024-03-26 LAB — URINALYSIS, ROUTINE W REFLEX MICROSCOPIC
Bilirubin Urine: NEGATIVE
Glucose, UA: NEGATIVE mg/dL
Ketones, ur: NEGATIVE mg/dL
Nitrite: NEGATIVE
Protein, ur: 100 mg/dL — AB
RBC / HPF: >50 RBC/hpf (ref 0–5)
Specific Gravity, Urine: 1.015 (ref 1.005–1.030)
pH: 5 (ref 5.0–8.0)

## 2024-03-26 LAB — PROTIME-INR
INR: 5.1 (ref 0.8–1.2)
Prothrombin Time: 49.3 s — ABNORMAL HIGH (ref 11.4–15.2)

## 2024-03-26 LAB — LACTIC ACID, PLASMA: Lactic Acid, Venous: 1.3 mmol/L (ref 0.5–1.9)

## 2024-03-26 MED ORDER — TORSEMIDE 20 MG PO TABS: 20.0000 mg | ORAL_TABLET | ORAL | 1 refills | Status: DC

## 2024-03-26 MED ORDER — ONDANSETRON HCL 4 MG/2ML IJ SOLN
4.0000 mg | Freq: Once | INTRAMUSCULAR | Status: AC
  Administered 2024-03-26: 4 mg via INTRAVENOUS
  Filled 2024-03-26: qty 2

## 2024-03-26 MED ORDER — METRONIDAZOLE 500 MG/100ML IV SOLN
500.0000 mg | Freq: Once | INTRAVENOUS | Status: AC
  Administered 2024-03-27: 500 mg via INTRAVENOUS
  Filled 2024-03-26: qty 100

## 2024-03-26 MED ORDER — SODIUM CHLORIDE 0.9 % IV SOLN
2.0000 g | Freq: Once | INTRAVENOUS | Status: AC
  Administered 2024-03-26: 2 g via INTRAVENOUS
  Filled 2024-03-26: qty 20

## 2024-03-26 MED ORDER — ACETAMINOPHEN 500 MG PO TABS
1000.0000 mg | ORAL_TABLET | Freq: Once | ORAL | Status: AC
  Administered 2024-03-26: 1000 mg via ORAL
  Filled 2024-03-26: qty 2

## 2024-03-26 MED ORDER — SODIUM CHLORIDE 0.9 % IV BOLUS
500.0000 mL | Freq: Once | INTRAVENOUS | Status: AC
  Administered 2024-03-26: 500 mL via INTRAVENOUS

## 2024-03-26 MED ORDER — LACTATED RINGERS IV BOLUS
1000.0000 mL | Freq: Once | INTRAVENOUS | Status: AC
Start: 2024-03-26 — End: 2024-03-27
  Administered 2024-03-26: 1000 mL via INTRAVENOUS

## 2024-03-26 NOTE — Telephone Encounter (Signed)
 Results discussed with daughter and she will decrease torsemide  to 20 mg every other day and repeat bmet in 7-10 days. She will try to encourage increased fluid intake to 60 ounces but admits this may be a challenge.

## 2024-03-26 NOTE — Telephone Encounter (Signed)
-----   Message from Laymon CHRISTELLA Qua sent at 03/26/2024 12:23 PM EDT ----- Please let the patient know that her hemoglobin remains stable at 10.1. WBC (infection fighting cells) mildly elevated at 10.9 but similar to prior values and recently treated for a UTI. Electrolytes  are within a normal range but her kidney function has acutely worsened as creatinine was at 1.68 in 11/2023 and now at 2.31  Suspect this is in the setting of dehydration given her changes in  appetite. Would recommend reducing Torsemide  to 20 mg every OTHER day. Would try to increase fluid intake to 60 ounces per day. Repeat BMET in 7-10 days.  ----- Message ----- From: Interface, [facility] [place] Sent: 03/26/2024  12:04 PM EDT To: Laymon CHRISTELLA Qua, PA-C

## 2024-03-26 NOTE — ED Provider Notes (Cosign Needed Addendum)
 [place] [facility]ERGENCY DEPARTMENT AT [facility] Provider Note   CSN: [id number] Arrival date & time: 03/26/24  1843     Patient presents with: Weakness   [name] is a 89 y.o. female.  She has a history of heart failure, anxiety, hypertension, A-fib, recurrent UTIs, CKD. She was brought to the ER today complaining of chills and generalized weakness.  Treated for UTI on 7/23 with Augmentin  which she just finished.  The past 2 days she has been feeling worse with generalized weakness.  She went to her cardiologist today for follow-up of her A-fib, lower extremity edema and hypertension.  They told going to decrease her torsemide  dosing due to worsening kidney function.  Over the past several days she has been feeling generally unwell but today was feeling much worse.  She also vomited.  Family states that she asked for a wheelchair when going to the doctor's office for appointment which is not normal for her.  She is also having a lot of back pain today in the middle of her back which is not usual for her.      Weakness      Prior to Admission medications   Medication Sig Start Date End Date Taking? Authorizing Provider  amoxicillin -clavulanate (AUGMENTIN ) 500-125 MG tablet Take 1 tablet by mouth 2 (two) times daily. Patient not taking: Reported on 03/26/2024 03/19/24   Patel, Rutwik K, MD  Ascorbic Acid (VITAMIN C PO) Take 1 tablet by mouth daily.    [provider]  busPIRone  (BUSPAR ) 7.5 MG tablet TAKE (1) TABLET BY MOUTH TWICE DAILY. 01/23/24   [name], MD  Cholecalciferol (VITAMIN D3 PO) Take 2 tablets by mouth daily.    [provider]  clotrimazole -betamethasone  (LOTRISONE ) cream Apply to affected area(s) twice daily as needed, for 5 to 7 days 06/01/23   [name], MD  Cyanocobalamin (VITAMIN B12 PO) Take 1 tablet by mouth daily.    [provider]  diltiazem  (CARDIZEM  CD) 300 MG 24 hr capsule TAKE ONE CAPSULE BY MOUTH  ONCE DAILY. 07/02/23   Alvan Dorn FALCON, MD  donepezil  (ARICEPT ) 10 MG tablet Take 1 tablet (10 mg total) by mouth at bedtime. 02/06/24   [name], MD  fluticasone  (FLONASE ) 50 MCG/ACT nasal spray Place 1 spray into both nostrils daily.    [provider]  levothyroxine  (SYNTHROID ) 112 MCG tablet Take 1 tablet (112 mcg total) by mouth daily. 11/26/23   [name], MD  loratadine  (CLARITIN ) 10 MG tablet Take 1 tablet (10 mg total) by mouth daily. 11/13/19   Moishe Chiquita CHRISTELLA, NP  lovastatin  (MEVACOR ) 40 MG tablet Take 1 tablet (40 mg total) by mouth at bedtime. 07/25/23   Alvan Dorn FALCON, MD  meclizine  (ANTIVERT ) 12.5 MG tablet Take 1 tablet (12.5 mg total) by mouth 2 (two) times daily as needed for dizziness. 05/15/19   [name], MD  metoprolol  tartrate (LOPRESSOR ) 100 MG tablet Take 1 tablet (100 mg total) by mouth 2 (two) times daily. 02/22/24   Alvan Dorn FALCON, MD  metoprolol  tartrate (LOPRESSOR ) 25 MG tablet Take 1 tablet (25 mg total) by mouth 2 (two) times daily. 06/20/23   Alvan Dorn FALCON, MD  Multiple Vitamins-Minerals (EYE VITAMINS & MINERALS PO) Take 1 tablet by mouth daily.    [provider]  nitrofurantoin , macrocrystal-monohydrate, (MACROBID ) 100 MG capsule TAKE 1 CAPSULE BY MOUTH EVERY MONDAY, WEDNESDAY AND FRIDAY Patient not taking: Reported on 03/26/2024 01/03/24  [name], MD  omeprazole  (PRILOSEC) 40 MG capsule Take 1 capsule (40 mg total) by mouth daily. 03/20/24   [name], MD  OXYGEN -HELIUM IN Inhale 2 L into the lungs at bedtime.    [provider]  torsemide  (DEMADEX ) 20 MG tablet Take 1 tablet (20 mg total) by mouth every other day. 03/26/24   Strader, Laymon HERO, PA-C  warfarin (COUMADIN ) 5 MG tablet TAKE 1/2 TABLET TO 1 TABLET BY MOUTH DAILY OR AS DIRECTED BY COUMADIN  CLINIC 09/11/23   Alvan Dorn FALCON, MD  zinc gluconate 50 MG tablet Take 50 mg by mouth daily.    [provider]   benzonatate  (TESSALON  PERLES) 100 MG capsule Take 1 capsule (100 mg total) by mouth every 6 (six) hours as needed for cough. 02/05/13 11/04/18  [name], MD    Allergies: Patient has no known allergies.    Review of Systems  Neurological:  Positive for weakness.    Updated Vital Signs BP (!) 84/62   Pulse 94   Temp 98.5 F (36.9 C) (Oral)   Resp 16   SpO2 95%   Physical Exam Vitals and nursing note reviewed.  Constitutional:      General: She is not in acute distress.    Appearance: She is well-developed. She is ill-appearing.  HENT:     Head: Normocephalic and atraumatic.     Mouth/Throat:     Mouth: Mucous membranes are dry.  Eyes:     Conjunctiva/sclera: Conjunctivae normal.  Cardiovascular:     Rate and Rhythm: Normal rate and regular rhythm.     Heart sounds: No murmur heard. Pulmonary:     Effort: Pulmonary effort is normal. No respiratory distress.     Breath sounds: Normal breath sounds.  Abdominal:     Palpations: Abdomen is soft.     Tenderness: There is no abdominal tenderness.  Musculoskeletal:        General: No swelling.     Cervical back: Neck supple.  Skin:    General: Skin is warm and dry.     Capillary Refill: Capillary refill takes less than 2 seconds.  Neurological:     General: No focal deficit present.     Mental Status: She is alert.  Psychiatric:        Mood and Affect: Mood normal.     (all labs ordered are listed, but only abnormal results are displayed) Labs Reviewed  CBC WITH DIFFERENTIAL/PLATELET - Abnormal; Notable for the following components:      Result Value   WBC 19.9 (*)    RBC 3.37 (*)    Hemoglobin 10.2 (*)    HCT 31.6 (*)    Platelets 421 (*)    Neutro Abs 18.2 (*)    Abs Immature Granulocytes 0.11 (*)    All other components within normal limits  COMPREHENSIVE METABOLIC PANEL WITH GFR - Abnormal; Notable for the following components:   Glucose, Bld 139 (*)    BUN 30 (*)    Creatinine, Ser 2.28 (*)     Calcium 8.8 (*)    Albumin 3.1 (*)    AST 383 (*)    ALT 173 (*)    Alkaline Phosphatase 514 (*)    Total Bilirubin 1.8 (*)    GFR, Estimated 19 (*)    All other components within normal limits  URINALYSIS, ROUTINE W REFLEX MICROSCOPIC - Abnormal; Notable for the following components:   Color, Urine AMBER (*)  APPearance CLOUDY (*)    Hgb urine dipstick LARGE (*)    Protein, ur 100 (*)    Leukocytes,Ua TRACE (*)    Bacteria, UA RARE (*)    All other components within normal limits  URINE CULTURE  CULTURE, BLOOD (ROUTINE X 2)  CULTURE, BLOOD (ROUTINE X 2)  LACTIC ACID, PLASMA  LACTIC ACID, PLASMA  PROTIME-INR    EKG: None  Radiology: DG Chest 1 View Result Date: 03/26/2024 CLINICAL DATA:  Weakness. EXAM: CHEST  1 VIEW COMPARISON:  02/08/2023. FINDINGS: The heart is enlarged the mediastinal contour stable. There is atherosclerotic calcification of the aorta. Stable coarse interstitial markings are present bilaterally. No consolidation, effusion, or pneumothorax is seen. A dual lead pacemaker is present over the left chest. No acute osseous abnormality. IMPRESSION: Stable chest with no active disease. Electronically Signed   By: Leita Birmingham M.D.   On: 03/26/2024 19:40     Procedures   Medications Ordered in the ED  lactated ringers  bolus 1,000 mL (has no administration in time range)  acetaminophen  (TYLENOL ) tablet 1,000 mg (has no administration in time range)  sodium chloride  0.9 % bolus 500 mL (0 mLs Intravenous Stopped 03/26/24 2044)  cefTRIAXone  (ROCEPHIN ) 2 g in sodium chloride  0.9 % 100 mL IVPB (0 g Intravenous Stopped 03/26/24 2254)  ondansetron  (ZOFRAN ) injection 4 mg (4 mg Intravenous Given 03/26/24 2219)                                    Medical Decision Making Differential diagnosis includes but not limited to UTI, sepsis, pyelonephritis, ureterolithiasis, pneumonia, anemia, gastroenteritis, other  ED course: Patient here today feeling generally unwell, she  looks ill.  Has history of frequent UTIs.  Today she was having back pain, vomited and was noted on urinalysis to have trace leukocytes, greater than 58 red blood cells per high-power field, 11-20 white blood cells and rare bacteria given the significant hematuria and back pain we will CT scan to rule out possible kidney stone, she was given 2 g Rocephin  for UTI.  He is given IV fluids for her AKI on CKD.  Plan on admission.  Signed out to Dr. Geroldine pending CT.      Amount and/or Complexity of Data Reviewed Labs: ordered.    Details: CBC with white blood cell count 19.9, hemoglobin 10.2 BUN is 30, creatinine 2.28, baseline is between 1.5 and 2 Radiology: ordered and independent interpretation performed. Decision-making details documented in ED Course.    Details: Chest x-ray shows no pulmonary edema or infiltrate I agree with radiology read.  Risk OTC drugs. Prescription drug management.        Final diagnoses:  None    ED Discharge Orders     None          Suellen Sherran DELENA DEVONNA 03/26/24 2303    Suellen Sherran DELENA DEVONNA 03/26/24 2320    Suzette Pac, MD 03/28/24 1430

## 2024-03-26 NOTE — ED Triage Notes (Signed)
 Pt bib rcems w/ c/o chills and weakness. PT recently was treated for a UTI. ABX completed 1 week ago. Family reported vomiting to EMS. Pt denies any pain. EMS reports pt is A&Ox4

## 2024-03-26 NOTE — Patient Instructions (Signed)
 Medication Instructions:  Your physician recommends that you continue on your current medications as directed. Please refer to the Current Medication list given to you today.  *If you need a refill on your cardiac medications before your next appointment, please call your pharmacy*  Lab Work: Your physician recommends that you return for lab work in: Today ( BMET, CBC)   If you have labs (blood work) drawn today and your tests are completely normal, you will receive your results only by: MyChart Message (if you have MyChart) OR A paper copy in the mail If you have any lab test that is abnormal or we need to change your treatment, we will call you to review the results.  Testing/Procedures: NONE   Follow-Up: At [facility], you and your health needs are our priority.  As part of our continuing mission to provide you with exceptional heart care, our providers are all part of one team.  This team includes your primary Cardiologist (physician) and Advanced Practice Providers or APPs (Physician Assistants and Nurse Practitioners) who all work together to provide you with the care you need, when you need it.  Your next appointment:   6 month(s)  Provider:   You may see Alvan Carrier, MD or one of the following Advanced Practice Providers on your designated Care Team:   Laymon Qua, PA-C  Scotesia Finley, [place] Olivia Pavy, [place]     We recommend signing up for the patient portal called MyChart.  Sign up information is provided on this After Visit Summary.  MyChart is used to connect with patients for Virtual Visits (Telemedicine).  Patients are able to view lab/test results, encounter notes, upcoming appointments, etc.  Non-urgent messages can be sent to your provider as well.   To learn more about what you can do with MyChart, go to [email or web address].   Other Instructions Thank you for choosing [facility]!

## 2024-03-27 ENCOUNTER — Encounter (HOSPITAL_COMMUNITY): Payer: Self-pay | Admitting: Family Medicine

## 2024-03-27 ENCOUNTER — Inpatient Hospital Stay (HOSPITAL_COMMUNITY)

## 2024-03-27 DIAGNOSIS — G4730 Sleep apnea, unspecified: Secondary | ICD-10-CM | POA: Diagnosis not present

## 2024-03-27 DIAGNOSIS — Z950 Presence of cardiac pacemaker: Secondary | ICD-10-CM | POA: Diagnosis not present

## 2024-03-27 DIAGNOSIS — R531 Weakness: Secondary | ICD-10-CM | POA: Diagnosis not present

## 2024-03-27 DIAGNOSIS — H3530 Unspecified macular degeneration: Secondary | ICD-10-CM | POA: Diagnosis not present

## 2024-03-27 DIAGNOSIS — K838 Other specified diseases of biliary tract: Secondary | ICD-10-CM

## 2024-03-27 DIAGNOSIS — E611 Iron deficiency: Secondary | ICD-10-CM | POA: Diagnosis present

## 2024-03-27 DIAGNOSIS — R918 Other nonspecific abnormal finding of lung field: Secondary | ICD-10-CM | POA: Diagnosis not present

## 2024-03-27 DIAGNOSIS — I480 Paroxysmal atrial fibrillation: Secondary | ICD-10-CM | POA: Diagnosis not present

## 2024-03-27 DIAGNOSIS — K831 Obstruction of bile duct: Secondary | ICD-10-CM | POA: Diagnosis not present

## 2024-03-27 DIAGNOSIS — N184 Chronic kidney disease, stage 4 (severe): Secondary | ICD-10-CM | POA: Diagnosis not present

## 2024-03-27 DIAGNOSIS — R509 Fever, unspecified: Secondary | ICD-10-CM | POA: Diagnosis not present

## 2024-03-27 DIAGNOSIS — M1990 Unspecified osteoarthritis, unspecified site: Secondary | ICD-10-CM | POA: Diagnosis not present

## 2024-03-27 DIAGNOSIS — R791 Abnormal coagulation profile: Secondary | ICD-10-CM | POA: Diagnosis present

## 2024-03-27 DIAGNOSIS — K449 Diaphragmatic hernia without obstruction or gangrene: Secondary | ICD-10-CM | POA: Diagnosis not present

## 2024-03-27 DIAGNOSIS — R748 Abnormal levels of other serum enzymes: Secondary | ICD-10-CM | POA: Diagnosis not present

## 2024-03-27 DIAGNOSIS — N179 Acute kidney failure, unspecified: Secondary | ICD-10-CM | POA: Diagnosis not present

## 2024-03-27 DIAGNOSIS — N185 Chronic kidney disease, stage 5: Secondary | ICD-10-CM | POA: Diagnosis present

## 2024-03-27 DIAGNOSIS — I130 Hypertensive heart and chronic kidney disease with heart failure and stage 1 through stage 4 chronic kidney disease, or unspecified chronic kidney disease: Secondary | ICD-10-CM | POA: Diagnosis present

## 2024-03-27 DIAGNOSIS — Z604 Social exclusion and rejection: Secondary | ICD-10-CM | POA: Diagnosis present

## 2024-03-27 DIAGNOSIS — I517 Cardiomegaly: Secondary | ICD-10-CM | POA: Diagnosis not present

## 2024-03-27 DIAGNOSIS — E785 Hyperlipidemia, unspecified: Secondary | ICD-10-CM | POA: Diagnosis not present

## 2024-03-27 DIAGNOSIS — J302 Other seasonal allergic rhinitis: Secondary | ICD-10-CM | POA: Diagnosis not present

## 2024-03-27 DIAGNOSIS — R7401 Elevation of levels of liver transaminase levels: Secondary | ICD-10-CM | POA: Diagnosis not present

## 2024-03-27 DIAGNOSIS — F32A Depression, unspecified: Secondary | ICD-10-CM | POA: Diagnosis present

## 2024-03-27 DIAGNOSIS — R0602 Shortness of breath: Secondary | ICD-10-CM | POA: Diagnosis not present

## 2024-03-27 DIAGNOSIS — I10 Essential (primary) hypertension: Secondary | ICD-10-CM | POA: Diagnosis not present

## 2024-03-27 DIAGNOSIS — E038 Other specified hypothyroidism: Secondary | ICD-10-CM | POA: Diagnosis not present

## 2024-03-27 DIAGNOSIS — R630 Anorexia: Secondary | ICD-10-CM | POA: Diagnosis not present

## 2024-03-27 DIAGNOSIS — E7849 Other hyperlipidemia: Secondary | ICD-10-CM | POA: Diagnosis not present

## 2024-03-27 DIAGNOSIS — K219 Gastro-esophageal reflux disease without esophagitis: Secondary | ICD-10-CM | POA: Diagnosis not present

## 2024-03-27 DIAGNOSIS — Z87440 Personal history of urinary (tract) infections: Secondary | ICD-10-CM | POA: Diagnosis not present

## 2024-03-27 DIAGNOSIS — D631 Anemia in chronic kidney disease: Secondary | ICD-10-CM | POA: Diagnosis present

## 2024-03-27 DIAGNOSIS — D500 Iron deficiency anemia secondary to blood loss (chronic): Secondary | ICD-10-CM | POA: Diagnosis not present

## 2024-03-27 DIAGNOSIS — J449 Chronic obstructive pulmonary disease, unspecified: Secondary | ICD-10-CM | POA: Diagnosis not present

## 2024-03-27 DIAGNOSIS — K5909 Other constipation: Secondary | ICD-10-CM | POA: Diagnosis not present

## 2024-03-27 DIAGNOSIS — F419 Anxiety disorder, unspecified: Secondary | ICD-10-CM | POA: Diagnosis present

## 2024-03-27 DIAGNOSIS — G4700 Insomnia, unspecified: Secondary | ICD-10-CM | POA: Diagnosis not present

## 2024-03-27 DIAGNOSIS — E039 Hypothyroidism, unspecified: Secondary | ICD-10-CM | POA: Diagnosis not present

## 2024-03-27 DIAGNOSIS — I5032 Chronic diastolic (congestive) heart failure: Secondary | ICD-10-CM | POA: Diagnosis not present

## 2024-03-27 DIAGNOSIS — Z79890 Hormone replacement therapy (postmenopausal): Secondary | ICD-10-CM | POA: Diagnosis not present

## 2024-03-27 DIAGNOSIS — K8021 Calculus of gallbladder without cholecystitis with obstruction: Secondary | ICD-10-CM | POA: Diagnosis present

## 2024-03-27 DIAGNOSIS — E7800 Pure hypercholesterolemia, unspecified: Secondary | ICD-10-CM | POA: Diagnosis present

## 2024-03-27 DIAGNOSIS — H9190 Unspecified hearing loss, unspecified ear: Secondary | ICD-10-CM | POA: Diagnosis not present

## 2024-03-27 DIAGNOSIS — K828 Other specified diseases of gallbladder: Secondary | ICD-10-CM | POA: Diagnosis not present

## 2024-03-27 DIAGNOSIS — K5730 Diverticulosis of large intestine without perforation or abscess without bleeding: Secondary | ICD-10-CM | POA: Diagnosis not present

## 2024-03-27 DIAGNOSIS — Z7901 Long term (current) use of anticoagulants: Secondary | ICD-10-CM | POA: Diagnosis not present

## 2024-03-27 DIAGNOSIS — Z825 Family history of asthma and other chronic lower respiratory diseases: Secondary | ICD-10-CM | POA: Diagnosis not present

## 2024-03-27 DIAGNOSIS — Z66 Do not resuscitate: Secondary | ICD-10-CM | POA: Diagnosis present

## 2024-03-27 DIAGNOSIS — N323 Diverticulum of bladder: Secondary | ICD-10-CM | POA: Diagnosis not present

## 2024-03-27 LAB — MAGNESIUM: Magnesium: 1.9 mg/dL (ref 1.7–2.4)

## 2024-03-27 LAB — CBC
HCT: 25.7 % — ABNORMAL LOW (ref 36.0–46.0)
Hemoglobin: 8.4 g/dL — ABNORMAL LOW (ref 12.0–15.0)
MCH: 30.5 pg (ref 26.0–34.0)
MCHC: 32.7 g/dL (ref 30.0–36.0)
MCV: 93.5 fL (ref 80.0–100.0)
Platelets: 313 K/uL (ref 150–400)
RBC: 2.75 MIL/uL — ABNORMAL LOW (ref 3.87–5.11)
RDW: 13.7 % (ref 11.5–15.5)
WBC: 25 K/uL — ABNORMAL HIGH (ref 4.0–10.5)
nRBC: 0 % (ref 0.0–0.2)

## 2024-03-27 LAB — COMPREHENSIVE METABOLIC PANEL WITH GFR
ALT: 157 U/L — ABNORMAL HIGH (ref 0–44)
AST: 252 U/L — ABNORMAL HIGH (ref 15–41)
Albumin: 2.5 g/dL — ABNORMAL LOW (ref 3.5–5.0)
Alkaline Phosphatase: 387 U/L — ABNORMAL HIGH (ref 38–126)
Anion gap: 9 (ref 5–15)
BUN: 31 mg/dL — ABNORMAL HIGH (ref 8–23)
CO2: 24 mmol/L (ref 22–32)
Calcium: 8.1 mg/dL — ABNORMAL LOW (ref 8.9–10.3)
Chloride: 103 mmol/L (ref 98–111)
Creatinine, Ser: 2.34 mg/dL — ABNORMAL HIGH (ref 0.44–1.00)
GFR, Estimated: 19 mL/min — ABNORMAL LOW (ref >60)
Glucose, Bld: 123 mg/dL — ABNORMAL HIGH (ref 70–99)
Potassium: 3.6 mmol/L (ref 3.5–5.1)
Sodium: 136 mmol/L (ref 135–145)
Total Bilirubin: 0.9 mg/dL (ref 0.0–1.2)
Total Protein: 5.9 g/dL — ABNORMAL LOW (ref 6.5–8.1)

## 2024-03-27 LAB — PROTIME-INR
INR: 5.4 (ref 0.8–1.2)
Prothrombin Time: 51.8 s — ABNORMAL HIGH (ref 11.4–15.2)

## 2024-03-27 LAB — LIPASE, BLOOD: Lipase: 122 U/L — ABNORMAL HIGH (ref 11–51)

## 2024-03-27 MED ORDER — METOPROLOL TARTRATE 50 MG PO TABS
125.0000 mg | ORAL_TABLET | Freq: Two times a day (BID) | ORAL | Status: DC
  Administered 2024-03-27 – 2024-04-03 (×14): 125 mg via ORAL
  Filled 2024-03-27 (×14): qty 1

## 2024-03-27 MED ORDER — DONEPEZIL HCL 5 MG PO TABS
10.0000 mg | ORAL_TABLET | Freq: Every day | ORAL | Status: DC
  Administered 2024-03-27 – 2024-04-02 (×7): 10 mg via ORAL
  Filled 2024-03-27 (×7): qty 2

## 2024-03-27 MED ORDER — ONDANSETRON HCL 4 MG PO TABS: 4.0000 mg | ORAL_TABLET | Freq: Four times a day (QID) | ORAL | Status: DC | PRN

## 2024-03-27 MED ORDER — PIPERACILLIN-TAZOBACTAM IN DEX 2-0.25 GM/50ML IV SOLN
2.2500 g | Freq: Three times a day (TID) | INTRAVENOUS | Status: DC
  Filled 2024-03-27 (×4): qty 50

## 2024-03-27 MED ORDER — PANTOPRAZOLE SODIUM 40 MG PO TBEC
40.0000 mg | DELAYED_RELEASE_TABLET | Freq: Every day | ORAL | Status: DC
  Administered 2024-03-27 – 2024-04-03 (×8): 40 mg via ORAL
  Filled 2024-03-27 (×8): qty 1

## 2024-03-27 MED ORDER — ONDANSETRON HCL 4 MG/2ML IJ SOLN
4.0000 mg | Freq: Four times a day (QID) | INTRAMUSCULAR | Status: DC | PRN
  Administered 2024-03-27: 4 mg via INTRAVENOUS
  Filled 2024-03-27: qty 2

## 2024-03-27 MED ORDER — SODIUM CHLORIDE 0.9 % IV SOLN
2.2500 g | Freq: Three times a day (TID) | INTRAVENOUS | Status: DC
  Administered 2024-03-27 – 2024-04-01 (×16): 2.25 g via INTRAVENOUS
  Filled 2024-03-27 (×14): qty 10
  Filled 2024-03-27: qty 2.25
  Filled 2024-03-27 (×4): qty 10

## 2024-03-27 MED ORDER — ACETAMINOPHEN 325 MG PO TABS
325.0000 mg | ORAL_TABLET | Freq: Four times a day (QID) | ORAL | Status: DC | PRN
  Administered 2024-03-31 – 2024-04-02 (×2): 325 mg via ORAL
  Filled 2024-03-27 (×2): qty 1

## 2024-03-27 MED ORDER — SODIUM CHLORIDE 0.9 % IV SOLN: INTRAVENOUS | Status: AC

## 2024-03-27 MED ORDER — SODIUM CHLORIDE 0.9 % IV SOLN: INTRAVENOUS | Status: DC

## 2024-03-27 MED ORDER — OXYCODONE HCL 5 MG PO TABS: 2.5000 mg | ORAL_TABLET | ORAL | Status: DC | PRN

## 2024-03-27 MED ORDER — SODIUM CHLORIDE 0.9% FLUSH
3.0000 mL | Freq: Two times a day (BID) | INTRAVENOUS | Status: DC
  Administered 2024-03-27 – 2024-04-03 (×13): 3 mL via INTRAVENOUS

## 2024-03-27 MED ORDER — FENTANYL CITRATE (PF) 100 MCG/2ML IJ SOLN: 12.5000 ug | INTRAMUSCULAR | Status: DC | PRN

## 2024-03-27 MED ORDER — BUSPIRONE HCL 15 MG PO TABS
7.5000 mg | ORAL_TABLET | Freq: Two times a day (BID) | ORAL | Status: DC
  Administered 2024-03-27 – 2024-04-03 (×15): 7.5 mg via ORAL
  Filled 2024-03-27 (×15): qty 1

## 2024-03-27 MED ORDER — BOOST / RESOURCE BREEZE PO LIQD CUSTOM
1.0000 | Freq: Three times a day (TID) | ORAL | Status: DC
  Administered 2024-03-28 – 2024-04-03 (×15): 1 via ORAL
  Filled 2024-03-27: qty 1

## 2024-03-27 MED ORDER — DILTIAZEM HCL ER COATED BEADS 300 MG PO CP24
300.0000 mg | ORAL_CAPSULE | Freq: Every day | ORAL | Status: DC
  Administered 2024-03-27 – 2024-04-03 (×8): 300 mg via ORAL
  Filled 2024-03-27 (×8): qty 1

## 2024-03-27 MED ORDER — LEVOTHYROXINE SODIUM 112 MCG PO TABS
112.0000 ug | ORAL_TABLET | Freq: Every day | ORAL | Status: DC
  Administered 2024-03-27 – 2024-04-03 (×8): 112 ug via ORAL
  Filled 2024-03-27 (×8): qty 1

## 2024-03-27 NOTE — Plan of Care (Signed)
   Problem: Health Behavior/Discharge Planning: Goal: Ability to manage health-related needs will improve Outcome: Progressing

## 2024-03-27 NOTE — Progress Notes (Signed)
 Progress Note   Patient: [name] FMW:[id number] DOB: [birth year] DOA: 03/26/2024     0 DOS: the patient was seen and examined on 03/27/2024   Brief hospital admission narrative: As per H&P written by Dr. Charlton on 03/26/2024 [name] is a 89 y.o. female with medical history significant for hypertension, hyperlipidemia, CKD stage IV, PAF on Coumadin , symptomatic bradycardia status post PPM, and recent UTI for which she has just completed a course of Augmentin  and now presents with worsening fatigue, generalized weakness, loss of appetite, chills, nausea, and vomiting.   Her daughters are at the bedside and assists with the history.  She has had a poor appetite for more than a week now, and has had fatigue and generalized weakness which has worsened significantly over the past day or so.  She also had vomiting today and chills.  She rarely complains of anything and does her best to avoid the hospital but asked her daughters to call an ambulance for her today which was very unusual.   ED Course: Upon arrival to the ED, patient is found to be afebrile and saturating mid 90s on room air with normal HR and SBP ranging from 80-120.  Labs are most notable for creatinine 2.28, alkaline phosphatase 514, AST 383, ALT 173, total bilirubin 1.8, WBC 19,900, normal lactic acid, and INR 5.1.  CT demonstrates distended gallbladder and increased CBD diameter, now at 15 mm.   Blood and urine cultures were collected in the ED, 1.5 L IVF was administered, the patient was also given acetaminophen , Zofran , Rocephin , and Flagyl .  Assessment and plan 1-elevated LFTs/CBD dilatation and concern for cholangitis - Right upper quadrant ultrasound with concern for ongoing retained stone - Continue antibiotics, fluid resuscitation, slowly advancing diet as per GI recommendations and supportive care - MRCP will be arranged for 03/28/2024 - Follow clinical response. - LFTs trending down; WBCs are still elevated.  2-acute  kidney injury superimposed on chronic kidney disease stage IV - In the setting of continued nephrotoxic agents and prerenal azotemia - Continue fluid resuscitation - Holding diuretics and nephrotoxic agents - Follow renal function trend - Avoid the use of contrast and avoid hypotension.  3-paroxysmal atrial fibrillation/supratherapeutic INR/pacemaker implantation - INR is 5.3 - Continue holding warfarin - No signs of overt bleeding currently - Follow trend - Continue telemetry monitoring and continue the use of diltiazem  and metoprolol  for rate control.  4-hyperlipidemia - Continue holding statin in the setting of transaminitis - Follow trend and resume when appropriate.  Subjective:  Afebrile, no chest pain, no nausea, no vomiting.  Overall feeling better.  Physical Exam: Vitals:   03/27/24 0600 03/27/24 1046 03/27/24 1400 03/27/24 1415  BP: 95/62 106/63 98/63 98/63   Pulse: 75 75 (!) 110   Resp: 18 16 16    Temp: 97.8 F (36.6 C) 98 F (36.7 C) 98.3 F (36.8 C)   TempSrc: Oral Oral Oral   SpO2: 95% 99% 98%   Weight:      Height:       General exam: Alert, awake, following commands and in no acute distress.  Afebrile. Respiratory system: Good air movement bilaterally; no using accessory muscles. Cardiovascular system: Rate controlled, no rubs, no gallops, no JVD. Gastrointestinal system: Abdomen is nondistended, soft and nontender on today's examination.  Positive bowel sounds appreciated on exam. Central nervous system: Moving 4 limbs spontaneously.  No focal neurological deficits. Extremities: No cyanosis or clubbing. Skin: No petechiae. Psychiatry: Mood & affect appropriate.    Data  Reviewed: Comprehensive metabolic panel: Sodium 136, potassium 3.6, chloride 103, bicarb 24, BUN 31, creatinine 2.3, AST 252, ALT 157, alk phos 387, total bilirubin 0.9 and GFR 19 RAR:TARd 25,000, hemoglobin 8.4, platelet count 313K Magnesium: 1.9  Family Communication: Daughter at  bedside.  Disposition: Status is: Inpatient Remains inpatient appropriate because: Continue IV therapy.  Anticipating discharge back home once medically stable.  Time spent: 50 minutes  Author: Eric Nunnery, MD 03/27/2024 5:54 PM  For on call review [email or web address].

## 2024-03-27 NOTE — Progress Notes (Addendum)
 Brook , from [facility] called and said they will not have a pacer rep available today or tomorrow and maybe not throughout the weekend .  Dr. Ricky and Mitzie Boettcher along with Dr. Cinderella made aware.    Update:  it will be done tomorrow at 10am.  [facility] arranged and will be here at [facility]

## 2024-03-27 NOTE — TOC CM/SW Note (Signed)
 Transition of Care [facility]) - Inpatient Brief Assessment   Patient Details  Name: [name] MRN: [id number] Date of Birth: [birth year]  [facility] CM/SW Contact:    Lucie Lunger, LCSWA Phone Number: 03/27/2024, 3:52 PM   Clinical Narrative: CSW notes per chart review that PT eval has been ordered for pt. CSW to follow for PT recommendations once they have been able to eval pt. TOC to follow for needs.   Transition of Care Asessment: Insurance and Status: Insurance coverage has been reviewed Patient has primary care physician: Yes Home environment has been reviewed: From home Prior level of function:: Has family Prior/Current Home Services: No current home services Social Drivers of Health Review: SDOH reviewed no interventions necessary Readmission risk has been reviewed: Yes Transition of care needs: no transition of care needs at this time

## 2024-03-27 NOTE — Consult Note (Addendum)
 Gastroenterology Consult   Referring Provider: No ref. provider found Primary Care Physician:  [name], MD Primary Gastroenterologist:  not established   Patient ID: [name]; [id number]; 10-Apr-1926   Admit date: 03/26/2024  LOS: 0 days   Date of Consultation: 03/27/2024  Reason for Consultation:  elevated LFTs, Dilated CBD on imaging   History of Present Illness   [name] is a 89 y.o. year old female with history of anxiety, arthritis, asthma, A fib on Warfarin, CHF, CKD stage IV, COPD, GERD, depression, High cholesterol, HTN, hypothyroidism, MD, sleep apnea who presented to the ED yesterday with chills, nausea/vomiting, generalized weakness, diagnosed with UTI on 7/23, on Augmentin  which she just completed, also endorsed mid back pain which is new for her. Found to have dilated CBD on CT imaging and elevated LFTs. GI consulted for further evaluation  Pertinent data: -AST 383, ALT 173, Alk phos 514, T bili 1.8 -Lipase 122  -WBC 19.9 -INR 5.1  -LA 1.3 -UA +leukocytes, rare bacteria, protein 100, large hgb present -Blood cultures negative -afebrile  -MRCP ordered, patient does have a pacemaker (St. Jude)  --CT renal stone study: Distended gallbladder. No gallstones identified. Common bile duct is dilated measuring 15 mm, increased from prior. Small hiatal hernia. Sigmoid colon diverticulosis.  Consult: Family at bedside helps to provide history. States sudden onset of vomiting and abdominal pain on 7/18. Patient reports RLQ pain at that time, improved after vomiting. She has baseline constipation. Has been dealing with UTIs over the past few months with multiple recent antibiotics, recently started on Augmentin  around 7/24, has had some diarrhea since then. Has been on Macrobid  prior to this, taking this 3 times per week that she has been on chronically for the past few years, this was stopped when she started augmentin  with plans to resume thereafter. Family  denies any instances of blood in stools or melena. No other recent medications.  She also had an episode of vomiting last night and chills but family denies fevers at home that they are aware of. She reported to her family she felt very weak and requested they call EMS. Patient denies abdominal pain currently though reports she ate some jello this morning that did not sit well.      Past Medical History:  Diagnosis Date   Allergy    Annual physical exam 04/22/2015   Anxiety    Arthritis    Arthritis    Asthma    Atrial fibrillation (HCC)    Bleeding disorder (HCC)    CHF (congestive heart failure) (HCC)    CKD (chronic kidney disease) stage 4, GFR 15-29 ml/min (HCC) 09/08/2021   COPD (chronic obstructive pulmonary disease) (HCC)    Depression    Gastroesophageal reflux disease    Hearing impairment    Heart disease    Heart murmur    High cholesterol    Hyperlipidemia    Hypertension    Hypothyroidism    Impaired glucose tolerance    Macular degeneration    Oxygen  deficiency    Pancreatitis    Paroxysmal atrial fibrillation (HCC) 2011   Sick sinus syndrome (HCC) 2011   Atrial fibrilation 10/2009; and dual chamber pacemaker in 4/11; normal EF   Sleep apnea    Nocturnal oxygen  therapy   Zoster 2016    Past Surgical History:  Procedure Laterality Date   ABDOMINAL HYSTERECTOMY     CATARACT EXTRACTION     Bilateral   COLONOSCOPY  08/29/2007  Dr. Shaaron   EYE SURGERY     laser    PACEMAKER INSERTION  08/28/2009   dual chamber    PPM GENERATOR CHANGEOUT N/A 11/13/2016   Procedure: PPM Generator Changeout;  Surgeon: Waddell Danelle ORN, MD;  Location: [facility] INVASIVE CV LAB;  Service: Cardiovascular;  Laterality: N/A;   SALPINGOOPHORECTOMY  08/28/1968   right    Prior to Admission medications   Medication Sig Start Date End Date Taking? Authorizing Provider  amoxicillin -clavulanate (AUGMENTIN ) 500-125 MG tablet Take 1 tablet by mouth 2 (two) times Underwood. Patient not  taking: Reported on 03/26/2024 03/19/24   Patel, Rutwik K, MD  Ascorbic Acid (VITAMIN C PO) Take 1 tablet by mouth Underwood.    [provider]  busPIRone  (BUSPAR ) 7.5 MG tablet TAKE (1) TABLET BY MOUTH TWICE Underwood. 01/23/24   [name], MD  Cholecalciferol (VITAMIN D3 PO) Take 2 tablets by mouth Underwood.    [provider]  clotrimazole -betamethasone  (LOTRISONE ) cream Apply to affected area(s) twice Underwood as needed, for 5 to 7 days 06/01/23   [name], MD  Cyanocobalamin (VITAMIN B12 PO) Take 1 tablet by mouth Underwood.    [provider]  diltiazem  (CARDIZEM  CD) 300 MG 24 hr capsule TAKE ONE CAPSULE BY MOUTH ONCE Underwood. 07/02/23   Alvan Dorn FALCON, MD  donepezil  (ARICEPT ) 10 MG tablet Take 1 tablet (10 mg total) by mouth at bedtime. 02/06/24   [name], MD  fluticasone  (FLONASE ) 50 MCG/ACT nasal spray Place 1 spray into both nostrils Underwood.    [provider]  levothyroxine  (SYNTHROID ) 112 MCG tablet Take 1 tablet (112 mcg total) by mouth Underwood. 11/26/23   [name], MD  loratadine  (CLARITIN ) 10 MG tablet Take 1 tablet (10 mg total) by mouth Underwood. 11/13/19   Moishe Chiquita HERO, NP  lovastatin  (MEVACOR ) 40 MG tablet Take 1 tablet (40 mg total) by mouth at bedtime. 07/25/23   Alvan Dorn FALCON, MD  meclizine  (ANTIVERT ) 12.5 MG tablet Take 1 tablet (12.5 mg total) by mouth 2 (two) times Underwood as needed for dizziness. 05/15/19   [name], MD  metoprolol  tartrate (LOPRESSOR ) 100 MG tablet Take 1 tablet (100 mg total) by mouth 2 (two) times Underwood. 02/22/24   Alvan Dorn FALCON, MD  metoprolol  tartrate (LOPRESSOR ) 25 MG tablet Take 1 tablet (25 mg total) by mouth 2 (two) times Underwood. 06/20/23   Alvan Dorn FALCON, MD  Multiple Vitamins-Minerals (EYE VITAMINS & MINERALS PO) Take 1 tablet by mouth Underwood.    [provider]  nitrofurantoin , macrocrystal-monohydrate, (MACROBID ) 100 MG capsule TAKE 1 CAPSULE BY MOUTH EVERY MONDAY,  WEDNESDAY AND FRIDAY Patient not taking: Reported on 03/26/2024 01/03/24   [name], MD  omeprazole  (PRILOSEC) 40 MG capsule Take 1 capsule (40 mg total) by mouth Underwood. 03/20/24   [name], MD  OXYGEN -HELIUM IN Inhale 2 L into the lungs at bedtime.    [provider]  torsemide  (DEMADEX ) 20 MG tablet Take 1 tablet (20 mg total) by mouth every other day. 03/26/24   Strader, Brittany M, PA-C  warfarin (COUMADIN ) 5 MG tablet TAKE 1/2 TABLET TO 1 TABLET BY MOUTH Underwood OR AS DIRECTED BY COUMADIN  CLINIC 09/11/23   Alvan Dorn FALCON, MD  zinc gluconate 50 MG tablet Take 50 mg by mouth Underwood.    [provider]  benzonatate  (TESSALON  PERLES) 100 MG capsule Take 1 capsule (100 mg total) by mouth every 6 (six) hours as needed  for cough. 02/05/13 11/04/18  [name], MD    Current Facility-Administered Medications  Medication Dose Route Frequency Provider Last Rate Last Admin   0.9 %  sodium chloride  infusion   Intravenous Continuous Opyd, Evalene RAMAN, MD 100 mL/hr at 03/27/24 0111 New Bag at 03/27/24 0111   acetaminophen  (TYLENOL ) tablet 325 mg  325 mg Oral Q6H PRN Opyd, Evalene RAMAN, MD       busPIRone  (BUSPAR ) tablet 7.5 mg  7.5 mg Oral BID Opyd, Evalene RAMAN, MD       diltiazem  (CARDIZEM  CD) 24 hr capsule 300 mg  300 mg Oral Underwood Opyd, Timothy S, MD       donepezil  (ARICEPT ) tablet 10 mg  10 mg Oral QHS Opyd, Evalene RAMAN, MD       feeding supplement (BOOST / RESOURCE BREEZE) liquid 1 Container  1 Container Oral TID BM Opyd, Evalene RAMAN, MD       fentaNYL  (SUBLIMAZE ) injection 12.5-50 mcg  12.5-50 mcg Intravenous Q2H PRN Opyd, Evalene RAMAN, MD       levothyroxine  (SYNTHROID ) tablet 112 mcg  112 mcg Oral Q0600 Opyd, Timothy S, MD   112 mcg at 03/27/24 0612   metoprolol  tartrate (LOPRESSOR ) tablet 125 mg  125 mg Oral BID Opyd, Evalene RAMAN, MD       ondansetron  (ZOFRAN ) tablet 4 mg  4 mg Oral Q6H PRN Opyd, Evalene RAMAN, MD       Or   ondansetron  (ZOFRAN ) injection 4 mg  4 mg  Intravenous Q6H PRN Opyd, Evalene RAMAN, MD       oxyCODONE  (Oxy IR/ROXICODONE ) immediate release tablet 2.5-5 mg  2.5-5 mg Oral Q4H PRN Opyd, Evalene RAMAN, MD       pantoprazole  (PROTONIX ) EC tablet 40 mg  40 mg Oral Underwood Opyd, Timothy S, MD       piperacillin -tazobactam (ZOSYN ) IVPB 2.25 g  2.25 g Intravenous Q8H Opyd, Timothy S, MD       sodium chloride  flush (NS) 0.9 % injection 3 mL  3 mL Intravenous Q12H Opyd, Evalene RAMAN, MD        Allergies as of 03/26/2024   (No Known Allergies)    Family History  Problem Relation Age of Onset   Cancer Mother        gential   Cancer Father        throat    Pneumonia Sister    Emphysema Sister    Mitral valve prolapse Sister    Heart disease Other    Arthritis Other    Lung disease Other    Asthma Other    Melanoma Son    Anxiety disorder Daughter    Arthritis Daughter    Asthma Daughter    COPD Daughter    Depression Daughter    Kidney disease Daughter    Obesity Daughter    Anxiety disorder Daughter    Anxiety disorder Daughter    Arthritis Daughter     Social History   Socioeconomic History   Marital status: Widowed    Spouse name: Not on file   Number of children: 7   Years of education: college   Highest education level: Not on file  Occupational History   Occupation: retired     Associate Professor: RETIRED  Tobacco Use   Smoking status: Never    Passive exposure: Never   Smokeless tobacco: Never  Vaping Use   Vaping status: Never Used  Substance and Sexual Activity   Alcohol use: No  Drug use: No   Sexual activity: Not Currently    Birth control/protection: Surgical    Comment: hyst  Other Topics Concern   Not on file  Social History Narrative   Not on file   Social Drivers of Health   Financial Resource Strain: Low Risk  (09/04/2023)   Overall Financial Resource Strain (CARDIA)    Difficulty of Paying Living Expenses: Not hard at all  Food Insecurity: No Food Insecurity (03/27/2024)   Hunger Vital Sign    Worried  About Running Out of Food in the Last Year: Never true    Ran Out of Food in the Last Year: Never true  Transportation Needs: No Transportation Needs (03/27/2024)   PRAPARE - Administrator, Civil Service (Medical): No    Lack of Transportation (Non-Medical): No  Physical Activity: Inactive (09/04/2023)   Exercise Vital Sign    Days of Exercise per Week: 0 days    Minutes of Exercise per Session: 0 min  Stress: No Stress Concern Present (09/04/2023)   [employer] of Occupational Health - Occupational Stress Questionnaire    Feeling of Stress : Not at all  Social Connections: Socially Isolated (03/27/2024)   Social Connection and Isolation Panel    Frequency of Communication with Friends and Family: More than three times a week    Frequency of Social Gatherings with Friends and Family: More than three times a week    Attends Religious Services: Never    Database administrator or Organizations: No    Attends Banker Meetings: Never    Marital Status: Widowed  Intimate Partner Violence: Not At Risk (09/04/2023)   Humiliation, Afraid, Rape, and Kick questionnaire    Fear of Current or Ex-Partner: No    Emotionally Abused: No    Physically Abused: No    Sexually Abused: No     Review of Systems   Gen: Denies any fever, chills, loss of appetite, change in weight or weight loss CV: Denies chest pain, heart palpitations, syncope, edema  Resp: Denies shortness of breath with rest, cough, wheezing, coughing up blood, and pleurisy. GI: denies melena, hematochezia, constipation, dysphagia, odyonophagia, early satiety or weight loss. See HPI, previously with nausea/vomiting/abdominal pain which have resolved +diarrhea GU : Denies urinary burning, blood in urine, urinary frequency, and urinary incontinence. MS: Denies joint pain, limitation of movement, swelling, cramps, and atrophy.  Derm: Denies rash, itching, dry skin, hives. Psych: Denies depression, anxiety, memory  loss, hallucinations, and confusion. Heme: Denies bruising or bleeding Neuro:  Denies any headaches, dizziness, paresthesias, shaking  Physical Exam   Vital Signs in last 24 hours: Temp:  [97.8 F (36.6 C)-98.5 F (36.9 C)] 97.8 F (36.6 C) (07/31 0600) Pulse Rate:  [71-111] 75 (07/31 0600) Resp:  [16-28] 18 (07/31 0600) BP: (82-118)/(46-74) 95/62 (07/31 0600) SpO2:  [91 %-98 %] 95 % (07/31 0600) Weight:  [72.6 kg-80.3 kg] 80.3 kg (07/31 0225)   General:   Alert,  Well-developed, well-nourished, pleasant and cooperative in NAD Head:  Normocephalic and atraumatic. Eyes:  Sclera clear, no icterus.   Conjunctiva pink. Ears:  Normal auditory acuity. Mouth:  No deformity or lesions, dentition normal. Neck:  Supple; no masses Lungs:  Clear throughout to auscultation.   No wheezes, crackles, or rhonchi. No acute distress. Heart:  Regular rate and rhythm; no murmurs, clicks, rubs,  or gallops. Abdomen:  Soft, nontender and nondistended. No masses, hepatosplenomegaly or hernias noted. Normal bowel sounds, without guarding, and without  rebound.   Msk:  Symmetrical without gross deformities. Normal posture. Extremities:  Without clubbing or edema. Neurologic:  Alert and  oriented to person, place  Skin:  Intact without significant lesions or rashes. Psych:  Alert and cooperative. Normal mood and affect.   Labs/Studies   Recent Labs Recent Labs    03/26/24 1148 03/26/24 1937 03/27/24 0415  WBC 10.9* 19.9* 25.0*  HGB 10.1* 10.2* 8.4*  HCT 31.7* 31.6* 25.7*  PLT 404* 421* 313   BMET Recent Labs    03/26/24 1148 03/26/24 1937 03/27/24 0415  NA 137 136 136  K 3.7 3.5 3.6  CL 101 99 103  CO2 25 22 24   GLUCOSE 95 139* 123*  BUN 29* 30* 31*  CREATININE 2.31* 2.28* 2.34*  CALCIUM 9.0 8.8* 8.1*   LFT Recent Labs    03/26/24 1937 03/27/24 0415  PROT 7.1 5.9*  ALBUMIN 3.1* 2.5*  AST 383* 252*  ALT 173* 157*  ALKPHOS 514* 387*  BILITOT 1.8* 0.9   PT/INR Recent Labs     03/26/24 1937 03/27/24 0415  LABPROT 49.3* 51.8*  INR 5.1* 5.4*    Radiology/Studies CT Renal Stone Study Result Date: 03/26/2024 CLINICAL DATA:  Abdominal and flank pain. EXAM: CT ABDOMEN AND PELVIS WITHOUT CONTRAST TECHNIQUE: Multidetector CT imaging of the abdomen and pelvis was performed following the standard protocol without IV contrast. RADIATION DOSE REDUCTION: This exam was performed according to the departmental dose-optimization program which includes automated exposure control, adjustment of the mA and/or kV according to patient size and/or use of iterative reconstruction technique. COMPARISON:  CT abdomen and pelvis 05/07/2007. FINDINGS: Lower chest: No acute abnormality. Hepatobiliary: No focal liver abnormality is seen. The gallbladder is distended. No gallstones are identified. Common bile duct is dilated measuring 15 mm, increased from prior. Pancreas: Unremarkable. No pancreatic ductal dilatation or surrounding inflammatory changes. Spleen: Spleen is normal in size. There is a peripheral cyst in the spleen measuring 18 mm. This is new from prior. Adrenals/Urinary Tract: There is no hydronephrosis or urinary tract calculus. There is a right renal cyst measuring 2.2 cm. Adrenal glands are within normal limits. The bladder is decompressed. There is questionable bladder wall thickening. There is mild inflammatory stranding surrounding the bladder. Stomach/Bowel: There is a small hiatal hernia. Stomach is within normal limits. Appendix appears normal. No evidence of bowel wall thickening, distention, or inflammatory changes. There is sigmoid colon diverticulosis. Vascular/Lymphatic: Aortic atherosclerosis. No enlarged abdominal or pelvic lymph nodes. Reproductive: Status post hysterectomy. No adnexal masses. Other: No abdominal wall hernia or abnormality. No abdominopelvic ascites. Musculoskeletal: Severe degenerative changes affect the spine. There are mild degenerative changes of hips.  The bones are diffusely osteopenic IMPRESSION: 1. Distended gallbladder. No gallstones identified. 2. Common bile duct is dilated measuring 15 mm, increased from prior. Correlate clinically for biliary obstruction. 3. Questionable bladder wall thickening with mild inflammatory stranding surrounding the bladder. Correlate clinically for cystitis. 4. Right renal cyst. 5. Small hiatal hernia. 6. Sigmoid colon diverticulosis. 7. Aortic atherosclerosis. Aortic Atherosclerosis (ICD10-I70.0). Electronically Signed   By: Greig Pique M.D.   On: 03/26/2024 23:45   DG Chest 1 View Result Date: 03/26/2024 CLINICAL DATA:  Weakness. EXAM: CHEST  1 VIEW COMPARISON:  02/08/2023. FINDINGS: The heart is enlarged the mediastinal contour stable. There is atherosclerotic calcification of the aorta. Stable coarse interstitial markings are present bilaterally. No consolidation, effusion, or pneumothorax is seen. A dual lead pacemaker is present over the left chest. No acute osseous abnormality. IMPRESSION: Stable  chest with no active disease. Electronically Signed   By: Leita Birmingham M.D.   On: 03/26/2024 19:40     Assessment   [name] is a 89 y.o. year old female with history of anxiety, arthritis, asthma, A fib on Warfarin, CHF, CKD stage IV, COPD, GERD, depression, High cholesterol, HTN, hypothyroidism, MD, sleep apnea who presented to the ED yesterday with chills, generalized weakness, diagnosed with UTI on 7/23, on Augmentin  which she just completed, also endorsed mid back pain which is new for her. Found to have dilated CBD on CT imaging and elevated LFTs. GI consulted for further evaluation  Nausea/vomiting/Elevated LFTs/dilated CBD: -abdominal pain radiating into the back, nausea, vomiting, weakness, onset 7/18 -WBC 25, afebrile but endorses intermittent chills  -AST 383, ALT 173, Alk phos 514, T bili 1.8 INR 5.1 Lipase 122  -CT with CBD dilation up to 15mm, increased from prior exam -no pancreatic dilation  or surrounding inflammatory changes -recent course of Augmentin   -LFTs seem to be trending down today with AST 252, ALT 157, AP 387, T bili 0.9  -Recent course of augmentin  certainly could have precipitated elevation in her LFTs (DILI) though with findings of CBD dilation and nausea/vomiting/back pain, likely not the only contributing factor at play, query choledocolithiasis. -imaging did not suggest pancreatitis, though lipase elevated to almost 3x ULN and with abdominal pain radiating through her back.  -Fortunately she Is asymptomatic today and LFTs are improving, she is afebrile.  -Query transient choledocolithiasis. Unfortunately no capabilities of performing MRCP here with patient's St. Jude pacemaker, she would need transfer to [facility] for MRCP.   Discussion held with Dr. Cinderella and Dr. Ricky as well as family, as this time, will plan for conservative and symptomatic management, if LFTs trend back up or she becomes symptomatic, would need to consider transfer to obtain MRCP. In the meantime, will obtain RUQ US  for further evaluation of dilated CBD.   Plan / Recommendations   -Trend LFTs, INR Underwood -Continue supportive measures -Avoid hepatotoxic medications -Obtain RUQ US   -If pt becomes febrile, LFTs trend back up/she becomes symptomatic, would recommend transfer to [facility] for MRCP   03/27/2024, 9:15 AM  Mazin Emma L. Mariette, MSN, APRN, AGNP-C Adult-Gerontology Nurse Practitioner [facility] at [facility]  **Addendum: RUQ US  With CBD dilation of 17mm, no cholelithiasis. Distal CBD obstructive cause cannot be excluded. Given these findings along with leukocytosis, would recommend proceeding with MRCP at this time. Dr. Cinderella has discussed findings and recommendations with family who is in agreement to proceed with MRCP. Dr. Ricky aware of updated plan.

## 2024-03-27 NOTE — H&P (Signed)
 History and Physical    [name] FMW:[id number] DOB: 11-01-1925 DOA: 03/26/2024  PCP: [name], MD   Patient coming from: Home   Chief Complaint: Fatigue, general weakness, loss of appetite, N/V, chills  HPI: [name] is a 89 y.o. female with medical history significant for hypertension, hyperlipidemia, CKD stage IV, PAF on Coumadin , symptomatic bradycardia status post PPM, and recent UTI for which she has just completed a course of Augmentin  and now presents with worsening fatigue, generalized weakness, loss of appetite, chills, nausea, and vomiting.  Her daughters are at the bedside and assists with the history.  She has had a poor appetite for more than a week now, and has had fatigue and generalized weakness which has worsened significantly over the past day or so.  She also had vomiting today and chills.  She rarely complains of anything and does her best to avoid the hospital but asked her daughters to call an ambulance for her today which was very unusual.  ED Course: Upon arrival to the ED, patient is found to be afebrile and saturating mid 90s on room air with normal HR and SBP ranging from 80-120.  Labs are most notable for creatinine 2.28, alkaline phosphatase 514, AST 383, ALT 173, total bilirubin 1.8, WBC 19,900, normal lactic acid, and INR 5.1.  CT demonstrates distended gallbladder and increased CBD diameter, now at 15 mm.  Blood and urine cultures were collected in the ED, 1.5 L IVF was administered, the patient was also given acetaminophen , Zofran , Rocephin , and Flagyl .  Review of Systems:  ROS Limited by patient's clinical condition.  Past Medical History:  Diagnosis Date   Allergy    Annual physical exam 04/22/2015   Anxiety    Arthritis    Arthritis    Asthma    Atrial fibrillation (HCC)    Bleeding disorder (HCC)    CHF (congestive heart failure) (HCC)    CKD (chronic kidney disease) stage 4, GFR 15-29 ml/min (HCC) 09/08/2021   COPD (chronic  obstructive pulmonary disease) (HCC)    Depression    Gastroesophageal reflux disease    Hearing impairment    Heart disease    Heart murmur    High cholesterol    Hyperlipidemia    Hypertension    Hypothyroidism    Impaired glucose tolerance    Macular degeneration    Oxygen  deficiency    Pancreatitis    Paroxysmal atrial fibrillation (HCC) 2011   Sick sinus syndrome (HCC) 2011   Atrial fibrilation 10/2009; and dual chamber pacemaker in 4/11; normal EF   Sleep apnea    Nocturnal oxygen  therapy   Zoster 2016    Past Surgical History:  Procedure Laterality Date   ABDOMINAL HYSTERECTOMY     CATARACT EXTRACTION     Bilateral   COLONOSCOPY  08/29/2007   Dr. Shaaron   EYE SURGERY     laser    PACEMAKER INSERTION  08/28/2009   dual chamber    PPM GENERATOR CHANGEOUT N/A 11/13/2016   Procedure: PPM Generator Changeout;  Surgeon: Waddell Danelle ORN, MD;  Location: [facility];  Service: Cardiovascular;  Laterality: N/A;   SALPINGOOPHORECTOMY  08/28/1968   right    Social History:   reports that she has never smoked. She has never been exposed to tobacco smoke. She has never used smokeless tobacco. She reports that she does not drink alcohol and does not use drugs.  No Known Allergies  Family History  Problem Relation  Age of Onset   Cancer Mother        gential   Cancer Father        throat    Pneumonia Sister    Emphysema Sister    Mitral valve prolapse Sister    Heart disease Other    Arthritis Other    Lung disease Other    Asthma Other    Melanoma Son    Anxiety disorder Daughter    Arthritis Daughter    Asthma Daughter    COPD Daughter    Depression Daughter    Kidney disease Daughter    Obesity Daughter    Anxiety disorder Daughter    Anxiety disorder Daughter    Arthritis Daughter      Prior to Admission medications   Medication Sig Start Date End Date Taking? Authorizing Provider  amoxicillin -clavulanate (AUGMENTIN ) 500-125 MG tablet Take 1  tablet by mouth 2 (two) times daily. Patient not taking: Reported on 03/26/2024 03/19/24   Patel, Rutwik K, MD  Ascorbic Acid (VITAMIN C PO) Take 1 tablet by mouth daily.    [provider]  busPIRone  (BUSPAR ) 7.5 MG tablet TAKE (1) TABLET BY MOUTH TWICE DAILY. 01/23/24   [name], MD  Cholecalciferol (VITAMIN D3 PO) Take 2 tablets by mouth daily.    [provider]  clotrimazole -betamethasone  (LOTRISONE ) cream Apply to affected area(s) twice daily as needed, for 5 to 7 days 06/01/23   [name], MD  Cyanocobalamin (VITAMIN B12 PO) Take 1 tablet by mouth daily.    [provider]  diltiazem  (CARDIZEM  CD) 300 MG 24 hr capsule TAKE ONE CAPSULE BY MOUTH ONCE DAILY. 07/02/23   Alvan Dorn FALCON, MD  donepezil  (ARICEPT ) 10 MG tablet Take 1 tablet (10 mg total) by mouth at bedtime. 02/06/24   [name], MD  fluticasone  (FLONASE ) 50 MCG/ACT nasal spray Place 1 spray into both nostrils daily.    [provider]  levothyroxine  (SYNTHROID ) 112 MCG tablet Take 1 tablet (112 mcg total) by mouth daily. 11/26/23   [name], MD  loratadine  (CLARITIN ) 10 MG tablet Take 1 tablet (10 mg total) by mouth daily. 11/13/19   Moishe Chiquita HERO, NP  lovastatin  (MEVACOR ) 40 MG tablet Take 1 tablet (40 mg total) by mouth at bedtime. 07/25/23   Alvan Dorn FALCON, MD  meclizine  (ANTIVERT ) 12.5 MG tablet Take 1 tablet (12.5 mg total) by mouth 2 (two) times daily as needed for dizziness. 05/15/19   [name], MD  metoprolol  tartrate (LOPRESSOR ) 100 MG tablet Take 1 tablet (100 mg total) by mouth 2 (two) times daily. 02/22/24   Alvan Dorn FALCON, MD  metoprolol  tartrate (LOPRESSOR ) 25 MG tablet Take 1 tablet (25 mg total) by mouth 2 (two) times daily. 06/20/23   Alvan Dorn FALCON, MD  Multiple Vitamins-Minerals (EYE VITAMINS & MINERALS PO) Take 1 tablet by mouth daily.    [provider]  nitrofurantoin , macrocrystal-monohydrate, (MACROBID )  100 MG capsule TAKE 1 CAPSULE BY MOUTH EVERY MONDAY, WEDNESDAY AND FRIDAY Patient not taking: Reported on 03/26/2024 01/03/24   [name], MD  omeprazole  (PRILOSEC) 40 MG capsule Take 1 capsule (40 mg total) by mouth daily. 03/20/24   [name], MD  OXYGEN -HELIUM IN Inhale 2 L into the lungs at bedtime.    [provider]  torsemide  (DEMADEX ) 20 MG tablet Take 1 tablet (20 mg total) by mouth every other day. 03/26/24   Strader, Laymon HERO, PA-C  warfarin (  COUMADIN ) 5 MG tablet TAKE 1/2 TABLET TO 1 TABLET BY MOUTH DAILY OR AS DIRECTED BY COUMADIN  CLINIC 09/11/23   Alvan Dorn FALCON, MD  zinc gluconate 50 MG tablet Take 50 mg by mouth daily.    [provider]  benzonatate  (TESSALON  PERLES) 100 MG capsule Take 1 capsule (100 mg total) by mouth every 6 (six) hours as needed for cough. 02/05/13 11/04/18  [name], MD    Physical Exam: Vitals:   03/26/24 2300 03/26/24 2315 03/27/24 0005 03/27/24 0009  BP: (!) 82/71 108/60    Pulse: 89 94 96   Resp:  16 (!) 24   Temp:    98.1 F (36.7 C)  TempSrc:    Oral  SpO2: 91% 92% 97%     Constitutional: NAD, listless   Eyes: PERTLA, lids and conjunctivae normal ENMT: Mucous membranes are dry. Posterior pharynx clear of any exudate or lesions.   Neck: supple, no masses  Respiratory: no wheezing, no crackles. No accessory muscle use.  Cardiovascular: S1 & S2 heard, regular rate and rhythm. No JVD. Abdomen: Soft, no guarding. Bowel sounds active.  Musculoskeletal: no clubbing / cyanosis. No joint deformity upper and lower extremities.   Skin: no significant rashes, lesions, ulcers. Warm, dry, well-perfused. Neurologic: CN 2-12 grossly intact aside from gross hearing deficit. Moving all extremities. Awake and oriented to person, place, and situation.  Psychiatric: Calm. Cooperative.    Labs and Imaging on Admission: I have personally reviewed following labs and imaging studies  CBC: Recent Labs  Lab  03/26/24 1148 03/26/24 1937  WBC 10.9* 19.9*  NEUTROABS  --  18.2*  HGB 10.1* 10.2*  HCT 31.7* 31.6*  MCV 94.6 93.8  PLT 404* 421*   Basic Metabolic Panel: Recent Labs  Lab 03/26/24 1148 03/26/24 1937  NA 137 136  K 3.7 3.5  CL 101 99  CO2 25 22  GLUCOSE 95 139*  BUN 29* 30*  CREATININE 2.31* 2.28*  CALCIUM 9.0 8.8*   GFR: Estimated Creatinine Clearance: 14.7 mL/min (A) (by C-G formula based on SCr of 2.28 mg/dL (H)). Liver Function Tests: Recent Labs  Lab 03/26/24 1937  AST 383*  ALT 173*  ALKPHOS 514*  BILITOT 1.8*  PROT 7.1  ALBUMIN 3.1*   No results for input(s): LIPASE, AMYLASE in the last 168 hours. No results for input(s): AMMONIA in the last 168 hours. Coagulation Profile: Recent Labs  Lab 03/26/24 1937  INR 5.1*   Cardiac Enzymes: No results for input(s): CKTOTAL, CKMB, CKMBINDEX, TROPONINI in the last 168 hours. BNP (last 3 results) No results for input(s): PROBNP in the last 8760 hours. HbA1C: No results for input(s): HGBA1C in the last 72 hours. CBG: No results for input(s): GLUCAP in the last 168 hours. Lipid Profile: No results for input(s): CHOL, HDL, LDLCALC, TRIG, CHOLHDL, LDLDIRECT in the last 72 hours. Thyroid  Function Tests: No results for input(s): TSH, T4TOTAL, FREET4, T3FREE, THYROIDAB in the last 72 hours. Anemia Panel: No results for input(s): VITAMINB12, FOLATE, FERRITIN, TIBC, IRON, RETICCTPCT in the last 72 hours. Urine analysis:    Component Value Date/Time   COLORURINE AMBER (A) 03/26/2024 2022   APPEARANCEUR CLOUDY (A) 03/26/2024 2022   APPEARANCEUR Clear 06/01/2023 1619   LABSPEC 1.015 03/26/2024 2022   PHURINE 5.0 03/26/2024 2022   GLUCOSEU NEGATIVE 03/26/2024 2022   HGBUR LARGE (A) 03/26/2024 2022   HGBUR trace-lysed 01/11/2010 1458   BILIRUBINUR NEGATIVE 03/26/2024 2022   BILIRUBINUR Negative 06/01/2023 1619   KETONESUR NEGATIVE  03/26/2024 2022   PROTEINUR  100 (A) 03/26/2024 2022   UROBILINOGEN 0.2 05/25/2022 1147   UROBILINOGEN 0.2 02/09/2015 1715   NITRITE NEGATIVE 03/26/2024 2022   LEUKOCYTESUR TRACE (A) 03/26/2024 2022   Sepsis Labs: @LABRCNTIP (procalcitonin:4,lacticidven:4) ) Recent Results (from the past 240 hours)  Urine Culture     Status: Abnormal   Collection Time: 03/20/24 10:00 AM   Specimen: Urine   UR  Result Value Ref Range Status   Urine Culture, Routine Final report (A)  Final   Organism ID, Bacteria Comment (A)  Final    Comment: Escherichia coli, identified by an automated biochemical system. Cefazolin  with an MIC <=16 predicts susceptibility to the oral agents cefaclor, cefdinir, cefpodoxime, cefprozil, cefuroxime, cephalexin , and loracarbef when used for therapy of uncomplicated urinary tract infections due to E. coli, Klebsiella pneumoniae, and Proteus mirabilis. Greater than 100,000 colony forming units per mL    ORGANISM ID, BACTERIA Comment  Final    Comment: Mixed urogenital flora Less than 10,000 colonies/mL    Antimicrobial Susceptibility Comment  Final    Comment:       ** S = Susceptible; I = Intermediate; R = Resistant **                    P = Positive; N = Negative             MICS are expressed in micrograms per mL    Antibiotic                 RSLT#1    RSLT#2    RSLT#3    RSLT#4 Amoxicillin /Clavulanic Acid    S Ampicillin                      S Cefazolin                       S Cefepime                       S Cefoxitin                      S Cefpodoxime                    S Ceftriaxone                     S Ciprofloxacin                   R Ertapenem                      S Gentamicin                      S Levofloxacin                   R Meropenem                      S Nitrofurantoin                  R Piperacillin /Tazobactam        S Tetracycline                    S Tobramycin                     S Trimethoprim /Sulfa   S   Blood culture (routine x 2)     Status: None  (Preliminary result)   Collection Time: 03/26/24 11:02 PM   Specimen: BLOOD  Result Value Ref Range Status   Specimen Description BLOOD BLOOD LEFT ARM  Final   Special Requests   Final    BOTTLES DRAWN AEROBIC AND ANAEROBIC Blood Culture adequate volume Performed at [facility], [address], [place], [place] [address]    Culture PENDING  Incomplete   Report Status PENDING  Incomplete  Blood culture (routine x 2)     Status: None (Preliminary result)   Collection Time: 03/26/24 11:04 PM   Specimen: BLOOD  Result Value Ref Range Status   Specimen Description BLOOD BLOOD LEFT HAND  Final   Special Requests   Final    BOTTLES DRAWN AEROBIC AND ANAEROBIC Blood Culture adequate volume Performed at [facility], [address] [place], [place] [address]    Culture PENDING  Incomplete   Report Status PENDING  Incomplete     Radiological Exams on Admission: CT Renal Stone Study Result Date: 03/26/2024 CLINICAL DATA:  Abdominal and flank pain. EXAM: CT ABDOMEN AND PELVIS WITHOUT CONTRAST TECHNIQUE: Multidetector CT imaging of the abdomen and pelvis was performed following the standard protocol without IV contrast. RADIATION DOSE REDUCTION: This exam was performed according to the departmental dose-optimization program which includes automated exposure control, adjustment of the mA and/or kV according to patient size and/or use of iterative reconstruction technique. COMPARISON:  CT abdomen and pelvis 05/07/2007. FINDINGS: Lower chest: No acute abnormality. Hepatobiliary: No focal liver abnormality is seen. The gallbladder is distended. No gallstones are identified. Common bile duct is dilated measuring 15 mm, increased from prior. Pancreas: Unremarkable. No pancreatic ductal dilatation or surrounding inflammatory changes. Spleen: Spleen is normal in size. There is a peripheral cyst in the spleen measuring 18 mm. This is new from prior. Adrenals/Urinary Tract: There is no hydronephrosis or urinary  tract calculus. There is a right renal cyst measuring 2.2 cm. Adrenal glands are within normal limits. The bladder is decompressed. There is questionable bladder wall thickening. There is mild inflammatory stranding surrounding the bladder. Stomach/Bowel: There is a small hiatal hernia. Stomach is within normal limits. Appendix appears normal. No evidence of bowel wall thickening, distention, or inflammatory changes. There is sigmoid colon diverticulosis. Vascular/Lymphatic: Aortic atherosclerosis. No enlarged abdominal or pelvic lymph nodes. Reproductive: Status post hysterectomy. No adnexal masses. Other: No abdominal wall hernia or abnormality. No abdominopelvic ascites. Musculoskeletal: Severe degenerative changes affect the spine. There are mild degenerative changes of hips. The bones are diffusely osteopenic IMPRESSION: 1. Distended gallbladder. No gallstones identified. 2. Common bile duct is dilated measuring 15 mm, increased from prior. Correlate clinically for biliary obstruction. 3. Questionable bladder wall thickening with mild inflammatory stranding surrounding the bladder. Correlate clinically for cystitis. 4. Right renal cyst. 5. Small hiatal hernia. 6. Sigmoid colon diverticulosis. 7. Aortic atherosclerosis. Aortic Atherosclerosis (ICD10-I70.0). Electronically Signed   By: Greig Pique M.D.   On: 03/26/2024 23:45   DG Chest 1 View Result Date: 03/26/2024 CLINICAL DATA:  Weakness. EXAM: CHEST  1 VIEW COMPARISON:  02/08/2023. FINDINGS: The heart is enlarged the mediastinal contour stable. There is atherosclerotic calcification of the aorta. Stable coarse interstitial markings are present bilaterally. No consolidation, effusion, or pneumothorax is seen. A dual lead pacemaker is present over the left chest. No acute osseous abnormality. IMPRESSION: Stable chest with no active disease. Electronically Signed   By: Leita Waddell HERO.D.  On: 03/26/2024 19:40    EKG: Independently reviewed. Atrial  fibrillation, PVC, RBBB, LAFB.   Assessment/Plan   1. Elevated LFTs; CBD dilation  - Check MRCP if pacemaker allows, check lipase, start Zosyn  given leukocytosis and hypotension, continue bowel rest for now, trend LFTs, routine GI consultation requested via Epic order    2. AKI superimposed on CKD IV  - No hydronephrosis on CT, likely prerenal in setting of recent anorexia and N/V  - Hold diuretics, continue IVF hydration, renally-dose medications, repeat chem panel in am   3. PAF; supratherapeutic INR  - INR is 5.1 in ED without bleeding  - Hold warfarin, repeat INR in am, monitor for bleeding, continue diltiazem  and metoprolol  as BP allows   4. Hyperlipidemia  - Hold statin while trending LFTs    DVT prophylaxis: Coumadin  pta, INR is elevated Code Status: DNR, discussed with patient and her daughters in ED   Level of Care: Level of care: Telemetry Family Communication: Two daughters at bedside  Disposition Plan:  Patient is from: home  Anticipated d/c is to: TBD Anticipated d/c date is: 03/30/24  Patient currently: pending trend in LFTs, possible MRCP, GI consultation  Consults called: Routine GI consult requested via Epic order  Admission status: Inpatient     Evalene GORMAN Sprinkles, MD [facility]  03/27/2024, 12:38 AM

## 2024-03-27 NOTE — Progress Notes (Signed)
 Pt has been up to [facility] x2 today to void, urine remains amber/tea colored. Pt did experience nausea and abd pain requiring antiemetic administration after eating jello this am. VSS. Pt has been sleeping off & on most of the day. Family at bedside.

## 2024-03-28 ENCOUNTER — Telehealth: Payer: Self-pay | Admitting: Gastroenterology

## 2024-03-28 ENCOUNTER — Inpatient Hospital Stay (HOSPITAL_COMMUNITY)

## 2024-03-28 ENCOUNTER — Telehealth: Payer: Self-pay | Admitting: Cardiology

## 2024-03-28 ENCOUNTER — Encounter (HOSPITAL_COMMUNITY): Payer: Self-pay

## 2024-03-28 DIAGNOSIS — R630 Anorexia: Secondary | ICD-10-CM | POA: Diagnosis not present

## 2024-03-28 DIAGNOSIS — R748 Abnormal levels of other serum enzymes: Secondary | ICD-10-CM | POA: Diagnosis not present

## 2024-03-28 DIAGNOSIS — K838 Other specified diseases of biliary tract: Secondary | ICD-10-CM | POA: Diagnosis not present

## 2024-03-28 DIAGNOSIS — E7849 Other hyperlipidemia: Secondary | ICD-10-CM | POA: Diagnosis not present

## 2024-03-28 DIAGNOSIS — E038 Other specified hypothyroidism: Secondary | ICD-10-CM | POA: Diagnosis not present

## 2024-03-28 DIAGNOSIS — N179 Acute kidney failure, unspecified: Secondary | ICD-10-CM | POA: Diagnosis not present

## 2024-03-28 DIAGNOSIS — K831 Obstruction of bile duct: Secondary | ICD-10-CM | POA: Diagnosis not present

## 2024-03-28 LAB — URINE CULTURE: Culture: <10000 — AB

## 2024-03-28 LAB — COMPREHENSIVE METABOLIC PANEL WITH GFR
ALT: 113 U/L — ABNORMAL HIGH (ref 0–44)
AST: 121 U/L — ABNORMAL HIGH (ref 15–41)
Albumin: 2.2 g/dL — ABNORMAL LOW (ref 3.5–5.0)
Alkaline Phosphatase: 334 U/L — ABNORMAL HIGH (ref 38–126)
Anion gap: 10 (ref 5–15)
BUN: 29 mg/dL — ABNORMAL HIGH (ref 8–23)
CO2: 24 mmol/L (ref 22–32)
Calcium: 8.3 mg/dL — ABNORMAL LOW (ref 8.9–10.3)
Chloride: 106 mmol/L (ref 98–111)
Creatinine, Ser: 2.08 mg/dL — ABNORMAL HIGH (ref 0.44–1.00)
GFR, Estimated: 21 mL/min — ABNORMAL LOW (ref >60)
Glucose, Bld: 89 mg/dL (ref 70–99)
Potassium: 3.6 mmol/L (ref 3.5–5.1)
Sodium: 140 mmol/L (ref 135–145)
Total Bilirubin: 0.5 mg/dL (ref 0.0–1.2)
Total Protein: 5.6 g/dL — ABNORMAL LOW (ref 6.5–8.1)

## 2024-03-28 LAB — CBC
HCT: 26.6 % — ABNORMAL LOW (ref 36.0–46.0)
Hemoglobin: 8.6 g/dL — ABNORMAL LOW (ref 12.0–15.0)
MCH: 30.5 pg (ref 26.0–34.0)
MCHC: 32.3 g/dL (ref 30.0–36.0)
MCV: 94.3 fL (ref 80.0–100.0)
Platelets: 286 K/uL (ref 150–400)
RBC: 2.82 MIL/uL — ABNORMAL LOW (ref 3.87–5.11)
RDW: 13.8 % (ref 11.5–15.5)
WBC: 11.4 K/uL — ABNORMAL HIGH (ref 4.0–10.5)
nRBC: 0 % (ref 0.0–0.2)

## 2024-03-28 LAB — PROTIME-INR
INR: 4 — ABNORMAL HIGH (ref 0.8–1.2)
Prothrombin Time: 40.4 s — ABNORMAL HIGH (ref 11.4–15.2)

## 2024-03-28 MED ORDER — LORAZEPAM 2 MG/ML IJ SOLN
1.0000 mg | Freq: Once | INTRAMUSCULAR | Status: AC
  Administered 2024-03-28: 1 mg via INTRAVENOUS

## 2024-03-28 MED ORDER — SODIUM CHLORIDE 0.9 % IV SOLN: INTRAVENOUS | Status: AC

## 2024-03-28 NOTE — Progress Notes (Signed)
 Progress Note   Patient: [name] FMW:[id number] DOB: 01-Apr-1926 DOA: 03/26/2024     1 DOS: the patient was seen and examined on 03/28/2024   Brief hospital admission narrative: As per H&P written by Dr. Charlton on 03/26/2024 [name] is a 89 y.o. female with medical history significant for hypertension, hyperlipidemia, CKD stage IV, PAF on Coumadin , symptomatic bradycardia status post PPM, and recent UTI for which she has just completed a course of Augmentin  and now presents with worsening fatigue, generalized weakness, loss of appetite, chills, nausea, and vomiting.   Her daughters are at the bedside and assists with the history.  She has had a poor appetite for more than a week now, and has had fatigue and generalized weakness which has worsened significantly over the past day or so.  She also had vomiting today and chills.  She rarely complains of anything and does her best to avoid the hospital but asked her daughters to call an ambulance for her today which was very unusual.   ED Course: Upon arrival to the ED, patient is found to be afebrile and saturating mid 90s on room air with normal HR and SBP ranging from 80-120.  Labs are most notable for creatinine 2.28, alkaline phosphatase 514, AST 383, ALT 173, total bilirubin 1.8, WBC 19,900, normal lactic acid, and INR 5.1.  CT demonstrates distended gallbladder and increased CBD diameter, now at 15 mm.   Blood and urine cultures were collected in the ED, 1.5 L IVF was administered, the patient was also given acetaminophen , Zofran , Rocephin , and Flagyl .  Assessment and plan 1-elevated LFTs/CBD dilatation and concern for cholangitis - Right upper quadrant ultrasound with concerns for ongoing retained stone; but that has mainly been clear by MRCP. - Will continue antibiotics, fluid resuscitation and continue slowly advancing diet as per GI recommendations  -Continue supportive care. - MRCP did not demonstrate the presence of retained stones  or large masses; the study was compromised by movement degradation & artifacts.  CBD was visualized to be dilated. - Follow clinical response. - Bilirubin has normalized and patient's LFTs continue trending down; WBCs also demonstrating significant improvement and at the moment down to 11,000 range.  2-acute kidney injury superimposed on chronic kidney disease stage IV - In the setting of continued nephrotoxic agents and prerenal azotemia - Continue fluid resuscitation and maintain adequate hydration - Continue holding diuretics and nephrotoxic agents - Follow renal function trend - Continue avoiding the use of contrast and avoid hypotension.  3-paroxysmal atrial fibrillation/supratherapeutic INR/pacemaker implantation - INR is 5.3>> 4.0 - Continue holding warfarin - No signs of overt bleeding currently appreciated. - Follow trend/stability. - Continue telemetry monitoring and continue the use of diltiazem  and metoprolol  for rate control.  4-hyperlipidemia - Continue holding statin in the setting of transaminitis - Follow trend and resume when appropriate.  5-generalized weakness/physical deconditioning - Patient seen by physical therapy with recommendation for skilled nursing facility at discharge - Family in agreement - TOC aware and helping with placement.  Subjective:  Afebrile, no chest pain, no nausea, no vomiting.  Physical Exam: Vitals:   03/27/24 2055 03/28/24 0449 03/28/24 1217 03/28/24 1558  BP: 120/69 135/62 (!) 132/91 117/60  Pulse: 98 86 80 78  Resp: 18 18 18 20   Temp: 98 F (36.7 C) 98.1 F (36.7 C) 98 F (36.7 C) 97.7 F (36.5 C)  TempSrc: Oral Oral Oral Oral  SpO2: 95% 94% 99% 100%  Weight:      Height:  General exam: Mildly somnolent after receiving lorazepam for MRI; no nausea, no vomiting, no complaints of abdominal pain.  Patient is afebrile. Respiratory system: Good air movement bilaterally; no using accessory muscles. Cardiovascular system:  Rate controlled, no rubs, no gallops, no JVD. Gastrointestinal system: Abdomen demonstrating positive bowel sounds; no guarding, no distention. Central nervous system: Generally weak.  No focal neurological deficits. Extremities: No cyanosis or clubbing; no edema. Skin: No petechiae. Psychiatry: Overall mood and affect appropriate.  Latest data Reviewed: Comprehensive metabolic panel: Sodium 140, potassium 3.6, chloride 106, bicarb 24, BUN 29, creatinine 2.08, AST 121, ALT 113, alk phos 334, total bilirubin 0.5 and GFR 21 CBC: WBCs 11.4, hemoglobin 8.6 and platelet count 2 86K INR: 4.0 with a prothrombin time of 40.4 Magnesium: 1.9  Family Communication: Daughter at bedside.  Disposition: Status is: Inpatient Remains inpatient appropriate because: Continue IV therapy.  Anticipating discharge back home once medically stable.   Time spent: 50 minutes  Author: Eric Nunnery, MD 03/28/2024 4:16 PM  For on call review [email or web address].

## 2024-03-28 NOTE — Telephone Encounter (Signed)
 Pt c/o swelling: STAT is pt has developed SOB within 24 hours  How much weight have you gained and in what time span?  Daughter says patient is up to 170 lbs today. Last Wednesday she was 160-162 lbs.   If swelling, where is the swelling located?  Legs   Are you currently taking a fluid pill?  Has been off fluid medication since yesterday  Are you currently SOB?  Daughter says patient has been coughing.  Do you have a log of your daily weights (if so, list)?  8/01: 170 lbs 7/23: 160 lbs  Have you gained 3 pounds in a day or 5 pounds in a week?   Have you traveled recently?  No

## 2024-03-28 NOTE — Plan of Care (Signed)

## 2024-03-28 NOTE — Care Management Important Message (Signed)
 Important Message  Patient Details  Name: [name] MRN: [id number] Date of Birth: 03-02-1926   Important Message Given:  N/A - LOS <3 / Initial given by admissions     Flor Whitacre L Darrick Greenlaw 03/28/2024, 3:15 PM

## 2024-03-28 NOTE — [place] FL2 (Signed)
   MEDICAID [id number] LEVEL OF CARE FORM     IDENTIFICATION  Patient Name: [name] Birthdate: 08/26/1926 Sex: female Admission Date (Current Location): 03/26/2024  [facility] and [place] Number:  [employer] and Address:  [facility],  [address], [place] [address]      Provider Number: [id number]  Attending Physician Name and Address:  Ricky Fines, MD  Relative Name and Phone Number:       Current Level of Care: Hospital Recommended Level of Care: Skilled Nursing Facility Prior Approval Number:    Date Approved/Denied:   PASRR Number: [id number]  Discharge Plan: SNF    Current Diagnoses: Patient Active Problem List   Diagnosis Date Noted   Acute renal failure superimposed on stage 4 chronic kidney disease (HCC) 03/27/2024   Biliary obstruction 03/27/2024   Chronic constipation 03/19/2024   Encounter for immunization 12/02/2023   Dysuria 06/03/2023   Dermatomycosis 06/03/2023   Hypotension 02/11/2023   Vitamin D  deficiency 09/07/2022   Encounter for Medicare annual examination with abnormal findings 04/24/2022   Malodorous urine 04/24/2022   Dermatitis 04/24/2022   CKD (chronic kidney disease) stage 4, GFR 15-29 ml/min (HCC) 09/08/2021   Stress due to family tension 08/16/2021   Skin lesion of face 02/24/2021   Recurrent UTI 08/14/2019   Atrial fibrillation with RVR (HCC) 07/03/2019   Vertigo 05/18/2019   Nocturnal hypoxia 12/03/2016   Osteopenia 10/18/2016   At high risk for falls 10/21/2015   Seasonal allergies 05/05/2014   Gastroesophageal reflux disease 05/05/2014   Sick sinus syndrome (HCC)    Chronic anticoagulation 11/18/2010   Impaired fasting glucose 10/14/2010   Paroxysmal atrial fibrillation (HCC) 06/14/2010   PPM-St.Jude 03/30/2010   GAD (generalized anxiety disorder) 12/05/2009   Impaired memory 03/03/2009   Hypothyroidism 02/25/2008   Hyperlipidemia 02/25/2008   Hearing loss 02/25/2008   Essential  hypertension 02/25/2008    Orientation RESPIRATION BLADDER Height & Weight     Self, Place  Normal Continent Weight: 177 lb 0.5 oz (80.3 kg) Height:  5' 6 (167.6 cm)  BEHAVIORAL SYMPTOMS/MOOD NEUROLOGICAL BOWEL NUTRITION STATUS      Continent Diet  AMBULATORY STATUS COMMUNICATION OF NEEDS Skin   Extensive Assist Verbally Normal                       Personal Care Assistance Level of Assistance  Bathing, Feeding, Dressing Bathing Assistance: Limited assistance Feeding assistance: Independent Dressing Assistance: Limited assistance     Functional Limitations Info  Sight, Hearing, Speech Sight Info: Impaired Hearing Info: Impaired Speech Info: Adequate    SPECIAL CARE FACTORS FREQUENCY  PT (By licensed PT), OT (By licensed OT)     PT Frequency: 5 times weekly OT Frequency: 5 times weekly            Contractures Contractures Info: Present    Additional Factors Info  Code Status, Allergies Code Status Info: DNR-Limited Allergies Info: NKA           Current Medications (03/28/2024):  This is the current hospital active medication list Current Facility-Administered Medications  Medication Dose Route Frequency Provider Last Rate Last Admin   acetaminophen  (TYLENOL ) tablet 325 mg  325 mg Oral Q6H PRN Opyd, Timothy S, MD       busPIRone  (BUSPAR ) tablet 7.5 mg  7.5 mg Oral BID Opyd, Timothy S, MD   7.5 mg at 03/28/24 1229   diltiazem  (CARDIZEM  CD) 24 hr capsule 300 mg  300 mg  Oral Daily Opyd, Timothy S, MD   300 mg at 03/28/24 1231   donepezil  (ARICEPT ) tablet 10 mg  10 mg Oral QHS Opyd, Timothy S, MD   10 mg at 03/27/24 2139   feeding supplement (BOOST / RESOURCE BREEZE) liquid 1 Container  1 Container Oral TID BM Opyd, Timothy S, MD   1 Container at 03/28/24 1440   fentaNYL  (SUBLIMAZE ) injection 12.5-50 mcg  12.5-50 mcg Intravenous Q2H PRN Opyd, Timothy S, MD       levothyroxine  (SYNTHROID ) tablet 112 mcg  112 mcg Oral Q0600 Opyd, Timothy S, MD   112 mcg at  03/28/24 0553   metoprolol  tartrate (LOPRESSOR ) tablet 125 mg  125 mg Oral BID Opyd, Timothy S, MD   125 mg at 03/28/24 1229   ondansetron  (ZOFRAN ) tablet 4 mg  4 mg Oral Q6H PRN Opyd, Timothy S, MD       Or   ondansetron  (ZOFRAN ) injection 4 mg  4 mg Intravenous Q6H PRN Opyd, Timothy S, MD   4 mg at 03/27/24 1117   oxyCODONE  (Oxy IR/ROXICODONE ) immediate release tablet 2.5-5 mg  2.5-5 mg Oral Q4H PRN Opyd, Timothy S, MD       pantoprazole  (PROTONIX ) EC tablet 40 mg  40 mg Oral Daily Opyd, Timothy S, MD   40 mg at 03/28/24 1229   piperacillin -tazobactam (ZOSYN ) 2.25 g in sodium chloride  0.9 % 50 mL IVPB  2.25 g Intravenous Q8H Ricky Fines, MD 100 mL/hr at 03/28/24 1440 2.25 g at 03/28/24 1440   sodium chloride  flush (NS) 0.9 % injection 3 mL  3 mL Intravenous Q12H Opyd, Timothy S, MD   3 mL at 03/27/24 2141     Discharge Medications: Please see discharge summary for a list of discharge medications.  Relevant Imaging Results:  Relevant Lab Results:   Additional Information SSN: [id number], LCSWA

## 2024-03-28 NOTE — CV Procedure (Signed)
  Device system confirmed to be MRI conditional, with implant date > 6 weeks ago, and no evidence of abandoned or epicardial leads in review of most recent CXR  Device last cleared by EP Provider: Suzann Riddle 03/28/24  Clearance is good through for 1 year as long as parameters remain stable at time of check. If pt undergoes a cardiac device procedure during that time, they should be re-cleared.   Tachy-therapies to be programmed off if applicable with device back to pre-MRI settings after completion of exam.  [facility] will be present for programming for the MRI.   Izetta CHRISTELLA Linen, RT  03/28/2024 10:06 AM

## 2024-03-28 NOTE — Progress Notes (Signed)
 Patient not seen as not back from MRCP trip- updated family regarding MRCP findings.  I reviewed the findings with him that there was some motion degradation precluding some views.  I did a short review of anatomy of the biliary tree to describe where the dilation tapering is occurring.  Discussed that no overt stone was found however given there is no obvious wall thickening, stones, or obvious biliary mass that this does not mean that there is not any inconspicuous of identifiable malignancy, polyp, stone, etc.  I discussed with them that the best way to further evaluate this is not endoscopic ultrasound (EUS).  I discussed with them that this is an endoscopic procedure that require sedation as well as special tools and would need to be done by specialty provider in [place] and that there is limited availability of slots for this and that as long as she continues to have improvement in LFTs and white blood cell count that this is not an emergent procedure and that if they decide they would like to proceed with this for further evaluation that it would be done on outpatient basis.  MRCP impression: 1. Scan significantly limited by motion degradation. 2. Mild diffuse central intrahepatic biliary ductal dilatation. Moderately dilated common bile duct with diameter 17 mm, with distal tapering. No convincing evidence of choledocholithiasis, biliary wall thickening or biliary masses on motion degraded MRCP. No discrete ampullary or pancreatic mass. No pancreatic duct dilation. Ampullary stricture or occult ampullary mass not excluded. Consider ERCP for further evaluation. 3. Slightly lobulated liver contour, cannot exclude cirrhosis. No liver mass. 4. Partially visualized diffuse bladder wall thickening with extensive bladder trabeculation and numerous small diffuse bladder diverticula. No hydronephrosis. 5. Small hiatal hernia. 6. Marked sigmoid diverticulosis.      Latest Ref Rng & Units 03/28/2024     4:31 AM 03/27/2024    4:15 AM 03/26/2024    7:37 PM 11/27/2023   10:30 AM  Hepatic Function  Total Protein 6.5 - 8.1 g/dL 5.6  5.9  7.1  7.0   Albumin 3.5 - 5.0 g/dL 2.2  2.5  3.1  4.1   AST 15 - 41 U/L 121  252  383  17   ALT 0 - 44 U/L 113  157  173  7   Alk Phosphatase 38 - 126 U/L 334  387  514  128   Total Bilirubin 0.0 - 1.2 mg/dL 0.5  0.9  1.8  0.4    They are concerned about her kidney function, weight, and lack of appetite/fullness. This will be further managed by hospitalist.   They are leaning towards potentially having her undergo EUS but would like to discuss it further.   Charmaine Melia, MSN, APRN, FNP-BC, AGACNP-BC [facility] at [facility]

## 2024-03-28 NOTE — Progress Notes (Signed)
 OT Cancellation Note  Patient Details Name: [name] MRN: [id number] DOB: 1926-01-14   Cancelled Treatment:    Reason Eval/Treat Not Completed: Patient at procedure or test/ unavailable. Pt at a procedure at time of attempted evaluation.   Denyla Cortese OT, MOT   Jayson Person 03/28/2024, 10:45 AM

## 2024-03-28 NOTE — TOC Initial Note (Signed)
 Transition of Care [facility]) - Initial/Assessment Note    Patient Details  Name: [name] MRN: [id number] Date of Birth: 01/08/1926  [facility] CM/SW Contact:    Lucie Lunger, LCSWA Phone Number: 03/28/2024, 3:20 PM  Clinical Narrative:                 CSW updated that PT is recommending SNF for pt at D/C. CSW spoke with pts daughter who is at bedside as pt is only oriented to person. They are agreeable to SNF placement and [facility] is their first choice, they are agreeable to referral to [facility] also. CSW to complete referral and send out for review. TOC to follow.   Expected Discharge Plan: Skilled Nursing Facility Barriers to Discharge: Continued Medical Work up   Patient Goals and CMS Choice Patient states their goals for this hospitalization and ongoing recovery are:: go to SNF CMS Medicare.gov Compare Post Acute Care list provided to:: Patient Represenative (must comment) Choice offered to / list presented to : Adult Children [place] ownership interest in [facility].provided to:: Adult Children    Expected Discharge Plan and Services In-house Referral: Clinical Social Work Discharge Planning Services: CM Consult Post Acute Care Choice: Skilled Nursing Facility Living arrangements for the past 2 months: Single Family Home                                      Prior Living Arrangements/Services Living arrangements for the past 2 months: Single Family Home Lives with:: Self Patient language and need for interpreter reviewed:: Yes Do you feel safe going back to the place where you live?: Yes      Need for Family Participation in Patient Care: Yes (Comment) Care giver support system in place?: Yes (comment)   Criminal Activity/Legal Involvement Pertinent to Current Situation/Hospitalization: No - Comment as needed  Activities of Daily Living   ADL Screening (condition at time of admission) Independently performs ADLs?: No Does the  patient have a NEW difficulty with bathing/dressing/toileting/self-feeding that is expected to last >3 days?: Yes (Initiates electronic notice to provider for possible OT consult) Does the patient have a NEW difficulty with getting in/out of bed, walking, or climbing stairs that is expected to last >3 days?: Yes (Initiates electronic notice to provider for possible PT consult) Does the patient have a NEW difficulty with communication that is expected to last >3 days?: No Is the patient deaf or have difficulty hearing?: Yes Does the patient have difficulty seeing, even when wearing glasses/contacts?: Yes Does the patient have difficulty concentrating, remembering, or making decisions?: Yes  Permission Sought/Granted                  Emotional Assessment Appearance:: Appears stated age Attitude/Demeanor/Rapport: Engaged Affect (typically observed): Accepting Orientation: : Oriented to Self Alcohol / Substance Use: Not Applicable Psych Involvement: No (comment)  Admission diagnosis:  Acute renal failure superimposed on stage 4 chronic kidney disease (HCC) [N17.9, N18.4] Patient Active Problem List   Diagnosis Date Noted   Acute renal failure superimposed on stage 4 chronic kidney disease (HCC) 03/27/2024   Biliary obstruction 03/27/2024   Chronic constipation 03/19/2024   Encounter for immunization 12/02/2023   Dysuria 06/03/2023   Dermatomycosis 06/03/2023   Hypotension 02/11/2023   Vitamin D  deficiency 09/07/2022   Encounter for Medicare annual examination with abnormal findings 04/24/2022   Malodorous urine 04/24/2022  Dermatitis 04/24/2022   CKD (chronic kidney disease) stage 4, GFR 15-29 ml/min (HCC) 09/08/2021   Stress due to family tension 08/16/2021   Skin lesion of face 02/24/2021   Recurrent UTI 08/14/2019   Atrial fibrillation with RVR (HCC) 07/03/2019   Vertigo 05/18/2019   Nocturnal hypoxia 12/03/2016   Osteopenia 10/18/2016   At high risk for falls 10/21/2015    Seasonal allergies 05/05/2014   Gastroesophageal reflux disease 05/05/2014   Sick sinus syndrome (HCC)    Chronic anticoagulation 11/18/2010   Impaired fasting glucose 10/14/2010   Paroxysmal atrial fibrillation (HCC) 06/14/2010   PPM-St.Jude 03/30/2010   GAD (generalized anxiety disorder) 12/05/2009   Impaired memory 03/03/2009   Hypothyroidism 02/25/2008   Hyperlipidemia 02/25/2008   Hearing loss 02/25/2008   Essential hypertension 02/25/2008   PCP:  [name], MD Pharmacy:   [facility] [place], [place] - [address] [place] [address] Phone: [phone] Fax: [phone]     Social Drivers of Health (SDOH) Social History: SDOH Screenings   Food Insecurity: No Food Insecurity (03/27/2024)  Housing: Low Risk  (03/27/2024)  Transportation Needs: No Transportation Needs (03/27/2024)  Utilities: Not At Risk (03/27/2024)  Alcohol Screen: Low Risk  (09/04/2023)  Depression (PHQ2-9): Low Risk  (03/19/2024)  Financial Resource Strain: Low Risk  (09/04/2023)  Physical Activity: Inactive (09/04/2023)  Social Connections: Socially Isolated (03/27/2024)  Stress: No Stress Concern Present (09/04/2023)  Tobacco Use: Low Risk  (03/27/2024)  Health Literacy: Inadequate Health Literacy (09/04/2023)   SDOH Interventions:     Readmission Risk Interventions    03/28/2024    3:19 PM  Readmission Risk Prevention Plan  Transportation Screening Complete  Home Care Screening Complete  Medication Review (RN CM) Complete

## 2024-03-28 NOTE — Plan of Care (Signed)
  Problem: Acute Rehab PT Goals(only PT should resolve) Goal: Pt Will Go Supine/Side To Sit Outcome: Progressing Flowsheets (Taken 03/28/2024 1514) Pt will go Supine/Side to Sit: with modified independence Goal: Patient Will Transfer Sit To/From Stand Outcome: Progressing Flowsheets (Taken 03/28/2024 1514) Patient will transfer sit to/from stand:  with minimal assist  with contact guard assist Goal: Pt Will Transfer Bed To Chair/Chair To Bed Outcome: Progressing Flowsheets (Taken 03/28/2024 1514) Pt will Transfer Bed to Chair/Chair to Bed:  with min assist  with contact guard assist Goal: Pt Will Ambulate Outcome: Progressing Flowsheets (Taken 03/28/2024 1514) Pt will Ambulate:  with minimal assist  25 feet  with rolling walker

## 2024-03-28 NOTE — Telephone Encounter (Signed)
 Please arrange hospital follow-up in 2-3 weeks with Dr. Cinderella or Mitzie who seen patient in the hospital. I spoke with family briefly but did not meet patient or family directly.   Crystal/Tanya - Repeat LFTs 1 week post discharge. Dx: elevated lfts, dilated CBD.

## 2024-03-28 NOTE — Evaluation (Signed)
 Physical Therapy Evaluation Patient Details Name: [name] MRN: [id number] DOB: 1926-03-18 Today's Date: 03/28/2024  History of Present Illness  [name] is a 89 y.o. female with medical history significant for hypertension, hyperlipidemia, CKD stage IV, PAF on Coumadin , symptomatic bradycardia status post PPM, and recent UTI for which she has just completed a course of Augmentin  and now presents with worsening fatigue, generalized weakness, loss of appetite, chills, nausea, and vomiting.     Her daughters are at the bedside and assists with the history.  She has had a poor appetite for more than a week now, and has had fatigue and generalized weakness which has worsened significantly over the past day or so.  She also had vomiting today and chills.  She rarely complains of anything and does her best to avoid the hospital but asked her daughters to call an ambulance for her today which was very unusual.   Clinical Impression  Patient demonstrates slow labored movement for sitting up at bedside requiring HOB elevated and use of handrails to position upright and scoot EOB. Patient demonstrates increased difficulty with sit to stand transfer secondary to LE weakness and is unsteady once in standing using RW. Patient limited to very short distance ambulation at the bedside due to weakness and fatigue. Patient left in recliner with call bell within reach and family members and nursing present. Patient will benefit from continued skilled physical therapy in hospital and recommended venue below to increase strength, balance, endurance for safe ADLs and gait.         If plan is discharge home, recommend the following: A lot of help with walking and/or transfers;Help with stairs or ramp for entrance;Assist for transportation   Can travel by private vehicle        Equipment Recommendations None recommended by PT  Recommendations for Other Services       Functional Status Assessment Patient has  had a recent decline in their functional status and demonstrates the ability to make significant improvements in function in a reasonable and predictable amount of time.     Precautions / Restrictions Precautions Precautions: Fall Recall of Precautions/Restrictions: Intact Restrictions Weight Bearing Restrictions Per Provider Order: No      Mobility  Bed Mobility Overal bed mobility: Needs Assistance Bed Mobility: Supine to Sit     Supine to sit: HOB elevated, Used rails, Supervision     General bed mobility comments: slow labored movement to sit upright and scootEOB    Transfers Overall transfer level: Needs assistance Equipment used: Rolling walker (2 wheels) Transfers: Sit to/from Stand, Bed to chair/wheelchair/BSC Sit to Stand: Mod assist   Step pivot transfers: Min assist, Mod assist       General transfer comment: labored movement; modA to power up; unsteady once on feet    Ambulation/Gait Ambulation/Gait assistance: Mod assist Gait Distance (Feet): 5 Feet Assistive device: Rolling walker (2 wheels) Gait Pattern/deviations: Decreased step length - right, Decreased step length - left, Trunk flexed, Decreased stride length Gait velocity: decreased     General Gait Details: limited to side steps at bedside due to BIL LE weakness  Stairs            Wheelchair Mobility     Tilt Bed    Modified Rankin (Stroke Patients Only)       Balance Overall balance assessment: Needs assistance Sitting-balance support: No upper extremity supported, Feet supported Sitting balance-Leahy Scale: Fair Sitting balance - Comments: fair/good seated EOB   Standing  balance support: Reliant on assistive device for balance, During functional activity, Bilateral upper extremity supported Standing balance-Leahy Scale: Poor Standing balance comment: poor using RW                             Pertinent Vitals/Pain Pain Assessment Pain Assessment: No/denies  pain    Home Living Family/patient expects to be discharged to:: Private residence Living Arrangements: Children Available Help at Discharge: Family;Available 24 hours/day Type of Home: House Home Access: Stairs to enter Entrance Stairs-Rails: Doctor, general practice of Steps: 1   Home Layout: One level Home Equipment: Agricultural consultant (2 wheels);Cane - single point Additional Comments: Per daughter, living in the same house for 11 years    Prior Function Prior Level of Function : Needs assist       Physical Assist : Mobility (physical);ADLs (physical) Mobility (physical): Transfers;Gait;Stairs ADLs (physical): IADLs Mobility Comments: community ambulator using RW ADLs Comments: some assistance from daughter for ADLs     Extremity/Trunk Assessment   Upper Extremity Assessment Upper Extremity Assessment: Defer to OT evaluation    Lower Extremity Assessment Lower Extremity Assessment: Generalized weakness    Cervical / Trunk Assessment Cervical / Trunk Assessment: Kyphotic  Communication   Communication Factors Affecting Communication: Hearing impaired (HOH)    Cognition Arousal: Alert Behavior During Therapy: WFL for tasks assessed/performed   PT - Cognitive impairments: No apparent impairments                       PT - Cognition Comments: Daughter at bedside to help with history Following commands: Intact       Cueing Cueing Techniques: Verbal cues, Tactile cues     General Comments      Exercises     Assessment/Plan    PT Assessment Patient needs continued PT services  PT Problem List Decreased strength;Decreased balance;Decreased mobility;Decreased activity tolerance       PT Treatment Interventions DME instruction;Therapeutic activities;Gait training;Therapeutic exercise;Patient/family education;Balance training;Stair training;Functional mobility training;Neuromuscular re-education    PT Goals (Current goals can be found in  the Care Plan section)  Acute Rehab PT Goals Patient Stated Goal: return home PT Goal Formulation: With patient/family Time For Goal Achievement: 04/11/24 Potential to Achieve Goals: Good    Frequency Min 3X/week     Co-evaluation               AM-PAC PT 6 Clicks Mobility  Outcome Measure Help needed turning from your back to your side while in a flat bed without using bedrails?: A Little Help needed moving from lying on your back to sitting on the side of a flat bed without using bedrails?: A Little Help needed moving to and from a bed to a chair (including a wheelchair)?: A Lot Help needed standing up from a chair using your arms (e.g., wheelchair or bedside chair)?: A Lot Help needed to walk in hospital room?: A Lot Help needed climbing 3-5 steps with a railing? : A Lot 6 Click Score: 14    End of Session Equipment Utilized During Treatment: Gait belt Activity Tolerance: Patient limited by fatigue Patient left: in chair;with nursing/sitter in room;with family/visitor present;with chair alarm set Nurse Communication: Mobility status PT Visit Diagnosis: Muscle weakness (generalized) (M62.81);Other abnormalities of gait and mobility (R26.89);Unsteadiness on feet (R26.81)    Time: 1412-1440 PT Time Calculation (min) (ACUTE ONLY): 28 min   Charges:   PT Evaluation $PT Eval Moderate Complexity: 1  Mod PT Treatments $Therapeutic Activity: 23-37 mins PT General Charges $$ ACUTE PT VISIT: 1 Visit        3:13 PM, 03/28/24,  Judah Carchi, SPT

## 2024-03-28 NOTE — Plan of Care (Signed)
  Problem: Education: Goal: Knowledge of General Education information will improve Description: Including pain rating scale, medication(s)/side effects and non-pharmacologic comfort measures 03/28/2024 2351 by Olene Corean CROME, RN Outcome: Progressing 03/28/2024 2350 by Olene Corean CROME, RN Outcome: Progressing   Problem: Health Behavior/Discharge Planning: Goal: Ability to manage health-related needs will improve 03/28/2024 2351 by Olene Corean CROME, RN Outcome: Progressing 03/28/2024 2350 by Olene Corean CROME, RN Outcome: Progressing   Problem: Clinical Measurements: Goal: Ability to maintain clinical measurements within normal limits will improve 03/28/2024 2351 by Olene Corean CROME, RN Outcome: Progressing 03/28/2024 2350 by Olene Corean CROME, RN Outcome: Progressing Goal: Will remain free from infection 03/28/2024 2351 by Olene Corean CROME, RN Outcome: Progressing 03/28/2024 2350 by Olene Corean CROME, RN Outcome: Progressing Goal: Diagnostic test results will improve 03/28/2024 2351 by Olene Corean CROME, RN Outcome: Progressing 03/28/2024 2350 by Olene Corean CROME, RN Outcome: Progressing Goal: Respiratory complications will improve 03/28/2024 2351 by Olene Corean CROME, RN Outcome: Progressing 03/28/2024 2350 by Olene Corean CROME, RN Outcome: Progressing Goal: Cardiovascular complication will be avoided 03/28/2024 2351 by Olene Corean CROME, RN Outcome: Progressing 03/28/2024 2350 by Olene Corean CROME, RN Outcome: Progressing   Problem: Activity: Goal: Risk for activity intolerance will decrease 03/28/2024 2351 by Olene Corean CROME, RN Outcome: Progressing 03/28/2024 2350 by Olene Corean CROME, RN Outcome: Progressing   Problem: Nutrition: Goal: Adequate nutrition will be maintained 03/28/2024 2351 by Olene Corean CROME, RN Outcome: Progressing 03/28/2024 2350 by Olene Corean CROME, RN Outcome:  Progressing   Problem: Coping: Goal: Level of anxiety will decrease 03/28/2024 2351 by Olene Corean CROME, RN Outcome: Progressing 03/28/2024 2350 by Olene Corean CROME, RN Outcome: Progressing   Problem: Elimination: Goal: Will not experience complications related to bowel motility 03/28/2024 2351 by Olene Corean CROME, RN Outcome: Progressing 03/28/2024 2350 by Olene Corean CROME, RN Outcome: Progressing Goal: Will not experience complications related to urinary retention 03/28/2024 2351 by Olene Corean CROME, RN Outcome: Progressing 03/28/2024 2350 by Olene Corean CROME, RN Outcome: Progressing   Problem: Pain Managment: Goal: General experience of comfort will improve and/or be controlled 03/28/2024 2351 by Olene Corean CROME, RN Outcome: Progressing 03/28/2024 2350 by Olene Corean CROME, RN Outcome: Progressing   Problem: Safety: Goal: Ability to remain free from injury will improve 03/28/2024 2351 by Olene Corean CROME, RN Outcome: Progressing 03/28/2024 2350 by Olene Corean CROME, RN Outcome: Progressing   Problem: Skin Integrity: Goal: Risk for impaired skin integrity will decrease 03/28/2024 2351 by Olene Corean CROME, RN Outcome: Progressing 03/28/2024 2350 by Olene Corean CROME, RN Outcome: Progressing

## 2024-03-29 DIAGNOSIS — E7849 Other hyperlipidemia: Secondary | ICD-10-CM | POA: Diagnosis not present

## 2024-03-29 DIAGNOSIS — K831 Obstruction of bile duct: Secondary | ICD-10-CM | POA: Diagnosis not present

## 2024-03-29 DIAGNOSIS — E038 Other specified hypothyroidism: Secondary | ICD-10-CM | POA: Diagnosis not present

## 2024-03-29 DIAGNOSIS — N179 Acute kidney failure, unspecified: Secondary | ICD-10-CM | POA: Diagnosis not present

## 2024-03-29 LAB — CBC
HCT: 25.8 % — ABNORMAL LOW (ref 36.0–46.0)
Hemoglobin: 8.3 g/dL — ABNORMAL LOW (ref 12.0–15.0)
MCH: 30.5 pg (ref 26.0–34.0)
MCHC: 32.2 g/dL (ref 30.0–36.0)
MCV: 94.9 fL (ref 80.0–100.0)
Platelets: 301 K/uL (ref 150–400)
RBC: 2.72 MIL/uL — ABNORMAL LOW (ref 3.87–5.11)
RDW: 13.7 % (ref 11.5–15.5)
WBC: 15.7 K/uL — ABNORMAL HIGH (ref 4.0–10.5)
nRBC: 0 % (ref 0.0–0.2)

## 2024-03-29 LAB — COMPREHENSIVE METABOLIC PANEL WITH GFR
ALT: 85 U/L — ABNORMAL HIGH (ref 0–44)
AST: 59 U/L — ABNORMAL HIGH (ref 15–41)
Albumin: 2.4 g/dL — ABNORMAL LOW (ref 3.5–5.0)
Alkaline Phosphatase: 310 U/L — ABNORMAL HIGH (ref 38–126)
Anion gap: 12 (ref 5–15)
BUN: 25 mg/dL — ABNORMAL HIGH (ref 8–23)
CO2: 21 mmol/L — ABNORMAL LOW (ref 22–32)
Calcium: 8.4 mg/dL — ABNORMAL LOW (ref 8.9–10.3)
Chloride: 105 mmol/L (ref 98–111)
Creatinine, Ser: 1.99 mg/dL — ABNORMAL HIGH (ref 0.44–1.00)
GFR, Estimated: 23 mL/min — ABNORMAL LOW (ref >60)
Glucose, Bld: 101 mg/dL — ABNORMAL HIGH (ref 70–99)
Potassium: 3.5 mmol/L (ref 3.5–5.1)
Sodium: 138 mmol/L (ref 135–145)
Total Bilirubin: 1.1 mg/dL (ref 0.0–1.2)
Total Protein: 5.9 g/dL — ABNORMAL LOW (ref 6.5–8.1)

## 2024-03-29 LAB — PROTIME-INR
INR: 2.3 — ABNORMAL HIGH (ref 0.8–1.2)
Prothrombin Time: 26.8 s — ABNORMAL HIGH (ref 11.4–15.2)

## 2024-03-29 MED ORDER — URSODIOL 60 MG/ML SUSP: 8.0000 mg/kg/d | Freq: Three times a day (TID) | ORAL | Status: DC

## 2024-03-29 MED ORDER — URSODIOL 300 MG PO CAPS
300.0000 mg | ORAL_CAPSULE | Freq: Three times a day (TID) | ORAL | Status: DC
  Administered 2024-03-29 – 2024-04-03 (×15): 300 mg via ORAL
  Filled 2024-03-29 (×18): qty 1

## 2024-03-29 NOTE — Progress Notes (Signed)
 Progress Note   Patient: [name] DOB: 1926/06/16 DOA: 03/26/2024     2 DOS: the patient was seen and examined on 03/29/2024   Brief hospital admission narrative: As per H&P written by Dr. Charlton on 03/26/2024 [name] is a 89 y.o. female with medical history significant for hypertension, hyperlipidemia, CKD stage IV, PAF on Coumadin , symptomatic bradycardia status post PPM, and recent UTI for which she has just completed a course of Augmentin  and now presents with worsening fatigue, generalized weakness, loss of appetite, chills, nausea, and vomiting.   Her daughters are at the bedside and assists with the history.  She has had a poor appetite for more than a week now, and has had fatigue and generalized weakness which has worsened significantly over the past day or so.  She also had vomiting today and chills.  She rarely complains of anything and does her best to avoid the hospital but asked her daughters to call an ambulance for her today which was very unusual.   ED Course: Upon arrival to the ED, patient is found to be afebrile and saturating mid 90s on room air with normal HR and SBP ranging from 80-120.  Labs are most notable for creatinine 2.28, alkaline phosphatase 514, AST 383, ALT 173, total bilirubin 1.8, WBC 19,900, normal lactic acid, and INR 5.1.  CT demonstrates distended gallbladder and increased CBD diameter, now at 15 mm.   Blood and urine cultures were collected in the ED, 1.5 L IVF was administered, the patient was also given acetaminophen , Zofran , Rocephin , and Flagyl .  Assessment and plan 1-elevated LFTs/CBD dilatation and concern for cholangitis - Right upper quadrant ultrasound with concerns for ongoing retained stone; but that has mainly been clear by MRCP. - Will continue antibiotics, fluid resuscitation and continue slowly advancing diet  -per GI recommendations we will start treatment with ursodiol  -Continue supportive care. - MRCP did not  demonstrate the presence of retained stones or large masses; the study was compromised by movement degradation & artifacts.  CBD was visualized to be dilated. - Continue to follow clinical response. - Bilirubin has normalized and patient's LFTs continue trending down; WBCs also demonstrating improvement  2-acute kidney injury superimposed on chronic kidney disease stage IV - In the setting of continued nephrotoxic agents and prerenal azotemia - Continue judicious fluid resuscitation and maintain adequate hydration - Continue holding diuretics and nephrotoxic agents - Follow renal function trend - Continue avoiding the use of contrast and avoid hypotension.  3-paroxysmal atrial fibrillation/supratherapeutic INR/pacemaker implantation - INR is 5.3>> 4.0>> 2.3 - Pharmacy to continue assisting with Coumadin  dosage. - No signs of overt bleeding currently appreciated. - Follow trend/stability. - Continue telemetry monitoring and continue the use of diltiazem  and metoprolol  for rate control.  4-hyperlipidemia - Continue holding statin in the setting of transaminitis - Follow trend and resume when appropriate.  5-generalized weakness/physical deconditioning - Patient seen by physical therapy with recommendation for skilled nursing facility at discharge - Family in agreement - TOC aware and helping with placement.  6-chronic diastolic heart failure - Stable and compensated - Continue to follow daily weights and strict I's and O's.  Subjective:  Afebrile, no chest pain, no nausea, no vomiting.  Physical Exam: Vitals:   03/28/24 1558 03/28/24 2019 03/29/24 0001 03/29/24 0402  BP: 117/60 118/84 129/78 131/84  Pulse: 78 85 (!) 57 89  Resp: 20 18 18  (!) 22  Temp: 97.7 F (36.5 C) 98.9 F (37.2 C) 98.1 F (36.7 C)  98.2 F (36.8 C)  TempSrc: Oral Oral Oral Oral  SpO2: 100% 97% 96% 96%  Weight:      Height:       General exam: Alert, awake, following commands appropriately and in no  acute distress.  Reports no nausea, no vomiting and no abdominal pain.  Decreased appetite reported. Respiratory system: No using accessory muscles; good air movement bilaterally. Cardiovascular system: Rate controlled, no rubs, no gallops, no JVD. Gastrointestinal system: Abdomen is nondistended, soft and nontender.  Positive bowel sounds. Central nervous system: Generally weak.  No focal neurological deficits. Extremities: No cyanosis, clubbing or edema. Skin: No petechiae. Psychiatry: Mood and affect appropriate.  Latest data Reviewed: Comprehensive metabolic panel: Sodium 138, potassium 3.5, chloride 105, bicarb 21, BUN 25, creatinine 1.9, AST 59, ALT 85, alk phos 310 and GFR 23.  Total bilirubin 1.1 INR: 2.3 Magnesium: 1.9 CBC: WBCs 15.7, hemoglobin 9.3 and platelet count 301K   Family Communication: Daughter and granddaughter at bedside.  Disposition: Status is: Inpatient Remains inpatient appropriate because: Continue IV therapy.  Anticipating discharge back home once medically stable.   Time spent: 50 minutes  Author: Eric Nunnery, MD 03/29/2024 4:59 PM  For on call review [email or web address].

## 2024-03-29 NOTE — Plan of Care (Signed)

## 2024-03-29 NOTE — Plan of Care (Signed)
   Problem: Education: Goal: Knowledge of General Education information will improve Description Including pain rating scale, medication(s)/side effects and non-pharmacologic comfort measures Outcome: Progressing   Problem: Health Behavior/Discharge Planning: Goal: Ability to manage health-related needs will improve Outcome: Progressing   Problem: Clinical Measurements: Goal: Ability to maintain clinical measurements within normal limits will improve Outcome: Progressing Goal: Respiratory complications will improve Outcome: Progressing   Problem: Nutrition: Goal: Adequate nutrition will be maintained Outcome: Progressing

## 2024-03-30 DIAGNOSIS — N179 Acute kidney failure, unspecified: Secondary | ICD-10-CM | POA: Diagnosis not present

## 2024-03-30 DIAGNOSIS — E7849 Other hyperlipidemia: Secondary | ICD-10-CM | POA: Diagnosis not present

## 2024-03-30 DIAGNOSIS — K831 Obstruction of bile duct: Secondary | ICD-10-CM | POA: Diagnosis not present

## 2024-03-30 DIAGNOSIS — E038 Other specified hypothyroidism: Secondary | ICD-10-CM | POA: Diagnosis not present

## 2024-03-30 LAB — COMPREHENSIVE METABOLIC PANEL WITH GFR
ALT: 60 U/L — ABNORMAL HIGH (ref 0–44)
AST: 28 U/L (ref 15–41)
Albumin: 2.3 g/dL — ABNORMAL LOW (ref 3.5–5.0)
Alkaline Phosphatase: 264 U/L — ABNORMAL HIGH (ref 38–126)
Anion gap: 9 (ref 5–15)
BUN: 25 mg/dL — ABNORMAL HIGH (ref 8–23)
CO2: 22 mmol/L (ref 22–32)
Calcium: 8.4 mg/dL — ABNORMAL LOW (ref 8.9–10.3)
Chloride: 105 mmol/L (ref 98–111)
Creatinine, Ser: 2.05 mg/dL — ABNORMAL HIGH (ref 0.44–1.00)
GFR, Estimated: 22 mL/min — ABNORMAL LOW (ref >60)
Glucose, Bld: 103 mg/dL — ABNORMAL HIGH (ref 70–99)
Potassium: 3.2 mmol/L — ABNORMAL LOW (ref 3.5–5.1)
Sodium: 136 mmol/L (ref 135–145)
Total Bilirubin: 1.2 mg/dL (ref 0.0–1.2)
Total Protein: 6 g/dL — ABNORMAL LOW (ref 6.5–8.1)

## 2024-03-30 LAB — CBC
HCT: 26.9 % — ABNORMAL LOW (ref 36.0–46.0)
Hemoglobin: 8.4 g/dL — ABNORMAL LOW (ref 12.0–15.0)
MCH: 30 pg (ref 26.0–34.0)
MCHC: 31.2 g/dL (ref 30.0–36.0)
MCV: 96.1 fL (ref 80.0–100.0)
Platelets: 291 K/uL (ref 150–400)
RBC: 2.8 MIL/uL — ABNORMAL LOW (ref 3.87–5.11)
RDW: 13.6 % (ref 11.5–15.5)
WBC: 12.4 K/uL — ABNORMAL HIGH (ref 4.0–10.5)
nRBC: 0 % (ref 0.0–0.2)

## 2024-03-30 LAB — PROTIME-INR
INR: 2 — ABNORMAL HIGH (ref 0.8–1.2)
Prothrombin Time: 23.9 s — ABNORMAL HIGH (ref 11.4–15.2)

## 2024-03-30 MED ORDER — POTASSIUM CHLORIDE CRYS ER 20 MEQ PO TBCR
40.0000 meq | EXTENDED_RELEASE_TABLET | Freq: Once | ORAL | Status: AC
  Administered 2024-03-30: 40 meq via ORAL
  Filled 2024-03-30: qty 2

## 2024-03-30 MED ORDER — WARFARIN SODIUM 5 MG PO TABS
5.0000 mg | ORAL_TABLET | Freq: Once | ORAL | Status: AC
  Administered 2024-03-30: 5 mg via ORAL
  Filled 2024-03-30: qty 1

## 2024-03-30 MED ORDER — WARFARIN - PHARMACIST DOSING INPATIENT: Freq: Every day | Status: DC

## 2024-03-30 MED ORDER — ORAL CARE MOUTH RINSE: 15.0000 mL | OROMUCOSAL | Status: DC | PRN

## 2024-03-30 MED ORDER — SODIUM CHLORIDE 0.9 % IV SOLN: INTRAVENOUS | Status: AC

## 2024-03-30 MED ORDER — TRAZODONE HCL 50 MG PO TABS
50.0000 mg | ORAL_TABLET | Freq: Every evening | ORAL | Status: DC | PRN
  Administered 2024-03-30: 50 mg via ORAL
  Filled 2024-03-30: qty 1

## 2024-03-30 NOTE — Progress Notes (Signed)
 Progress Note   Patient: [name] FMW:[id number] DOB: [birth year] DOA: 03/26/2024     3 DOS: the patient was seen and examined on 03/30/2024   Brief hospital admission narrative: As per H&P written by Dr. Charlton on 03/26/2024 Hayley PALMATIER is a 89 y.o. female with medical history significant for hypertension, hyperlipidemia, CKD stage IV, PAF on Coumadin , symptomatic bradycardia status post PPM, and recent UTI for which she has just completed a course of Augmentin  and now presents with worsening fatigue, generalized weakness, loss of appetite, chills, nausea, and vomiting.   Her daughters are at the bedside and assists with the history.  She has had a poor appetite for more than a week now, and has had fatigue and generalized weakness which has worsened significantly over the past day or so.  She also had vomiting today and chills.  She rarely complains of anything and does her best to avoid the hospital but asked her daughters to call an ambulance for her today which was very unusual.   ED Course: Upon arrival to the ED, patient is found to be afebrile and saturating mid 90s on room air with normal HR and SBP ranging from 80-120.  Labs are most notable for creatinine 2.28, alkaline phosphatase 514, AST 383, ALT 173, total bilirubin 1.8, WBC 19,900, normal lactic acid, and INR 5.1.  CT demonstrates distended gallbladder and increased CBD diameter, now at 15 mm.   Blood and urine cultures were collected in the ED, 1.5 L IVF was administered, the patient was also given acetaminophen , Zofran , Rocephin , and Flagyl .  Assessment and plan 1-elevated LFTs/CBD dilatation and concern for cholangitis - Right upper quadrant ultrasound with concerns for ongoing retained stone; but that has mainly been clear by MRCP. - Will continue antibiotics, fluid resuscitation and continue slowly advancing diet; with plans to advance to soft diet today. -per GI recommendations patient has started treatment with  ursodiol  -Continue supportive care. - MRCP did not demonstrate the presence of retained stones or large masses; the study was compromised by movement degradation & artifacts.  CBD was visualized to be dilated. - Continue to follow clinical response. - Bilirubin has normalized and patient's LFTs continue trending down; WBCs also demonstrating improvement  2-acute kidney injury superimposed on chronic kidney disease stage IV - In the setting of continued nephrotoxic agents and prerenal azotemia - Continue to maintain adequate hydration - Continue holding diuretics and nephrotoxic agents at the moment. -Continue to follow renal function trend - Continue avoiding the use of contrast and avoid hypotension.  3-paroxysmal atrial fibrillation/supratherapeutic INR/pacemaker implantation - INR is 5.3>> 4.0>> 2.3 >>2.0 - Pharmacy to continue assisting with Coumadin  dosage. - No signs of overt bleeding currently appreciated. -Patient's heart rate has remained stable and will be safe to discontinue telemetry. - Continue the use of diltiazem  and metoprolol  for rate control.  4-hyperlipidemia - Continue holding statin in the setting of transaminitis - Follow trend and resume when appropriate.  5-generalized weakness/physical deconditioning - Patient seen by physical therapy with recommendation for skilled nursing facility at discharge - Family in agreement - TOC aware and helping with placement.  6-chronic diastolic heart failure - Stable and compensated - Continue to follow daily weights and strict I's and O's.  7-insomnia - As needed trazodone  has been ordered.  Subjective:  No fever, no chest pain, no nausea, no vomiting.  Has tolerated full liquid diet without problems.  In no acute distress.  Some difficulty sleeping at night reported.  Physical Exam:  Vitals:   03/29/24 0402 03/29/24 1900 03/30/24 0538 03/30/24 1251  BP: 131/84 121/82 (!) 119/56 (!) 112/59  Pulse: 89 77 73 75  Resp:  (!) 22 20 20    Temp: 98.2 F (36.8 C) 97.6 F (36.4 C) 98.4 F (36.9 C) 98.1 F (36.7 C)  TempSrc: Oral Oral Oral Oral  SpO2: 96% 92% 96% 93%  Weight:      Height:       General exam: Alert, awake, following commands appropriately and in no acute distress.  Afebrile. Respiratory system: No wheezing or crackles appreciated on exam. Cardiovascular system: Rate controlled, no rubs, no gallops, no JVD. Gastrointestinal system: Abdomen is nondistended, soft and nontender.  Positive bowel sounds. Central nervous system: Moving 4 limbs spontaneously.  Generally weak.  No focal neurological deficits. Extremities: No cyanosis or clubbing. Skin: No petechiae. Psychiatry: Mood and affect appropriate.  Latest data Reviewed: Comprehensive metabolic panel: Sodium 136, potassium 3.2, chloride 105, bicarb 22, BUN 25, creatinine 2.05, AST 28, ALT 60, alk phos 264K, total bilirubin 1.2 and GFR 22. INR: 2.0 Magnesium: 1.9 CBC: WBCs 12.4, hemoglobin 8.4 and platelet count 291 K   Family Communication: Ganddaughter at bedside.  Disposition: Status is: Inpatient Remains inpatient appropriate because: Continue IV therapy.  Anticipating discharge back home once medically stable.  Time spent: 50 minutes  Author: Eric Nunnery, MD 03/30/2024 2:19 PM  For on call review [email or web address].

## 2024-03-30 NOTE — Progress Notes (Signed)
 PHARMACY - ANTICOAGULATION CONSULT NOTE  Pharmacy Consult for warfarin  Indication: atrial fibrillation  No Known Allergies  Patient Measurements: Height: 5' 6 (167.6 cm) Weight: 80.3 kg (177 lb 0.5 oz) IBW/kg (Calculated) : 59.3 HEPARIN DW (KG): 76  Vital Signs: Temp: 98.4 F (36.9 C) (08/03 0538) Temp Source: Oral (08/03 0538) BP: 119/56 (08/03 0538) Pulse Rate: 73 (08/03 0538)  Labs: Recent Labs    03/28/24 0431 03/29/24 0415 03/30/24 0340  HGB 8.6* 8.3* 8.4*  HCT 26.6* 25.8* 26.9*  PLT 286 301 291  LABPROT 40.4* 26.8* 23.9*  INR 4.0* 2.3* 2.0*  CREATININE 2.08* 1.99* 2.05*    Estimated Creatinine Clearance: 17.2 mL/min (A) (by C-G formula based on SCr of 2.05 mg/dL (H)).   Medical History: Past Medical History:  Diagnosis Date   Allergy    Annual physical exam 04/22/2015   Anxiety    Arthritis    Arthritis    Asthma    Atrial fibrillation (HCC)    Bleeding disorder (HCC)    CHF (congestive heart failure) (HCC)    CKD (chronic kidney disease) stage 4, GFR 15-29 ml/min (HCC) 09/08/2021   COPD (chronic obstructive pulmonary disease) (HCC)    Depression    Gastroesophageal reflux disease    Hearing impairment    Heart disease    Heart murmur    High cholesterol    Hyperlipidemia    Hypertension    Hypothyroidism    Impaired glucose tolerance    Macular degeneration    Oxygen  deficiency    Pancreatitis    Paroxysmal atrial fibrillation (HCC) 2011   Sick sinus syndrome (HCC) 2011   Atrial fibrilation 10/2009; and dual chamber pacemaker in 4/11; normal EF   Sleep apnea    Nocturnal oxygen  therapy   Zoster 2016   Assessment: 89 year old female on warfarin for afib. Warfarin was held on admit due to elevated INR and the need for procedures. Discussed with physician today and will start back warfarin tonight. INR was 5.1 on admit now down to 2.0. No bleeding issues noted this admit. Hemoglobin has remained stable in 8s.   Patient take 5mg  on Mondays  and 2.5mg  all other days.   Goal of Therapy:  INR 2-3 Monitor platelets by anticoagulation protocol: Yes   Plan:  Will give warfarin 5mg  tonight to keep her INR from falling too much given that she hasn't had any warfarin in over 5 days.   Dempsey Blush PharmD., BCPS Clinical Pharmacist 03/30/2024 12:39 PM

## 2024-03-30 NOTE — Plan of Care (Signed)

## 2024-03-31 DIAGNOSIS — K831 Obstruction of bile duct: Secondary | ICD-10-CM | POA: Diagnosis not present

## 2024-03-31 DIAGNOSIS — E7849 Other hyperlipidemia: Secondary | ICD-10-CM | POA: Diagnosis not present

## 2024-03-31 DIAGNOSIS — N179 Acute kidney failure, unspecified: Secondary | ICD-10-CM | POA: Diagnosis not present

## 2024-03-31 DIAGNOSIS — E038 Other specified hypothyroidism: Secondary | ICD-10-CM | POA: Diagnosis not present

## 2024-03-31 LAB — COMPREHENSIVE METABOLIC PANEL WITH GFR
ALT: 42 U/L (ref 0–44)
AST: 17 U/L (ref 15–41)
Albumin: 2.4 g/dL — ABNORMAL LOW (ref 3.5–5.0)
Alkaline Phosphatase: 232 U/L — ABNORMAL HIGH (ref 38–126)
Anion gap: 9 (ref 5–15)
BUN: 23 mg/dL (ref 8–23)
CO2: 19 mmol/L — ABNORMAL LOW (ref 22–32)
Calcium: 8.5 mg/dL — ABNORMAL LOW (ref 8.9–10.3)
Chloride: 108 mmol/L (ref 98–111)
Creatinine, Ser: 1.95 mg/dL — ABNORMAL HIGH (ref 0.44–1.00)
GFR, Estimated: 23 mL/min — ABNORMAL LOW (ref >60)
Glucose, Bld: 116 mg/dL — ABNORMAL HIGH (ref 70–99)
Potassium: 3.6 mmol/L (ref 3.5–5.1)
Sodium: 136 mmol/L (ref 135–145)
Total Bilirubin: 1 mg/dL (ref 0.0–1.2)
Total Protein: 6.2 g/dL — ABNORMAL LOW (ref 6.5–8.1)

## 2024-03-31 LAB — CULTURE, BLOOD (ROUTINE X 2)
Culture: NO GROWTH
Culture: NO GROWTH
Special Requests: ADEQUATE
Special Requests: ADEQUATE

## 2024-03-31 LAB — PROTIME-INR
INR: 1.8 — ABNORMAL HIGH (ref 0.8–1.2)
Prothrombin Time: 22.2 s — ABNORMAL HIGH (ref 11.4–15.2)

## 2024-03-31 MED ORDER — FUROSEMIDE 10 MG/ML IJ SOLN
20.0000 mg | Freq: Once | INTRAMUSCULAR | Status: AC
  Administered 2024-03-31: 20 mg via INTRAVENOUS
  Filled 2024-03-31: qty 2

## 2024-03-31 MED ORDER — WARFARIN SODIUM 2.5 MG PO TABS
2.5000 mg | ORAL_TABLET | Freq: Once | ORAL | Status: AC
  Administered 2024-03-31: 2.5 mg via ORAL
  Filled 2024-03-31: qty 1

## 2024-03-31 MED ORDER — IPRATROPIUM-ALBUTEROL 0.5-2.5 (3) MG/3ML IN SOLN
3.0000 mL | Freq: Three times a day (TID) | RESPIRATORY_TRACT | Status: DC | PRN
  Administered 2024-03-31: 3 mL via RESPIRATORY_TRACT
  Filled 2024-03-31: qty 3

## 2024-03-31 NOTE — Progress Notes (Signed)
 PHARMACY - ANTICOAGULATION CONSULT NOTE  Pharmacy Consult for warfarin  Indication: atrial fibrillation  No Known Allergies  Patient Measurements: Height: 5' 6 (167.6 cm) Weight: 80.3 kg (177 lb 0.5 oz) IBW/kg (Calculated) : 59.3 HEPARIN DW (KG): 76  Vital Signs: Temp: 98.1 F (36.7 C) (08/04 0519) Temp Source: Oral (08/04 0519) BP: 140/66 (08/04 0519) Pulse Rate: 75 (08/04 0519)  Labs: Recent Labs    03/29/24 0415 03/30/24 0340 03/31/24 0809  HGB 8.3* 8.4*  --   HCT 25.8* 26.9*  --   PLT 301 291  --   LABPROT 26.8* 23.9* 22.2*  INR 2.3* 2.0* 1.8*  CREATININE 1.99* 2.05* 1.95*    Estimated Creatinine Clearance: 18 mL/min (A) (by C-G formula based on SCr of 1.95 mg/dL (H)).   Medical History: Past Medical History:  Diagnosis Date   Allergy    Annual physical exam 04/22/2015   Anxiety    Arthritis    Arthritis    Asthma    Atrial fibrillation (HCC)    Bleeding disorder (HCC)    CHF (congestive heart failure) (HCC)    CKD (chronic kidney disease) stage 4, GFR 15-29 ml/min (HCC) 09/08/2021   COPD (chronic obstructive pulmonary disease) (HCC)    Depression    Gastroesophageal reflux disease    Hearing impairment    Heart disease    Heart murmur    High cholesterol    Hyperlipidemia    Hypertension    Hypothyroidism    Impaired glucose tolerance    Macular degeneration    Oxygen  deficiency    Pancreatitis    Paroxysmal atrial fibrillation (HCC) 2011   Sick sinus syndrome (HCC) 2011   Atrial fibrilation 10/2009; and dual chamber pacemaker in 4/11; normal EF   Sleep apnea    Nocturnal oxygen  therapy   Zoster 2016   Assessment: 89 year old female on warfarin for afib. Warfarin was held on admit due to elevated INR and the need for procedures. Discussed with physician today and will start back warfarin tonight. INR was 5.1 on admit now down to 2.0. No bleeding issues noted this admit. Hemoglobin has remained stable in 8s.   Patient take 5mg  on Mondays  and 2.5mg  all other days.   Goal of Therapy:  INR 2-3 Monitor platelets by anticoagulation protocol: Yes   Plan:  Warfarin 2.5 mg x 1 dose- received 5 mg yesterday and supratherapeutic INR on admission. Monitor INR daily and s/s of bleeding.  Elspeth Sour, PharmD Clinical Pharmacist 03/31/2024 8:51 AM

## 2024-03-31 NOTE — Evaluation (Signed)
 Occupational Therapy Evaluation Patient Details Name: [name] MRN: [id number] DOB: 10-16-1925 Today's Date: 03/31/2024   History of Present Illness   [name] is a 89 y.o. female with medical history significant for hypertension, hyperlipidemia, CKD stage IV, PAF on Coumadin , symptomatic bradycardia status post PPM, and recent UTI for which she has just completed a course of Augmentin  and now presents with worsening fatigue, generalized weakness, loss of appetite, chills, nausea, and vomiting.     Her daughters are at the bedside and assists with the history.  She has had a poor appetite for more than a week now, and has had fatigue and generalized weakness which has worsened significantly over the past day or so.  She also had vomiting today and chills.  She rarely complains of anything and does her best to avoid the hospital but asked her daughters to call an ambulance for her today which was very unusual.     Clinical Impressions Pt agreeable to OT and PT co-evaluation/treatment. Pt required mod to max A for bed mobility and mod to max A for sit to stand with RW. Max A for stand pivot to the chair without RW due to increased weakness and B LE knees buckling. Pt demonstrates very limited shoulder A/ROM but near passive range for shoulder flexion. Mod to max A for lower body ADL's at this time. Pt left in the chair with call bell within reach and family present. Pt will benefit from continued OT in the hospital and recommended venue below to increase strength, balance, and endurance for safe ADL's.        If plan is discharge home, recommend the following:   A lot of help with walking and/or transfers;A lot of help with bathing/dressing/bathroom;Assistance with cooking/housework;Assist for transportation;Help with stairs or ramp for entrance     Functional Status Assessment   Patient has had a recent decline in their functional status and demonstrates the ability to make  significant improvements in function in a reasonable and predictable amount of time.     Equipment Recommendations   None recommended by OT             Precautions/Restrictions   Precautions Precautions: Fall Recall of Precautions/Restrictions: Intact Restrictions Weight Bearing Restrictions Per Provider Order: No     Mobility Bed Mobility Overal bed mobility: Needs Assistance Bed Mobility: Supine to Sit     Supine to sit: Max assist, Mod assist     General bed mobility comments: slow labored movement to sit upright and scootEOB    Transfers Overall transfer level: Needs assistance Equipment used: Rolling walker (2 wheels) Transfers: Sit to/from Stand, Bed to chair/wheelchair/BSC Sit to Stand: Mod assist, Max assist Stand pivot transfers: Max assist         General transfer comment: ~3 reps of attempted sit to stand with RW before transfer to chair without RW and max A. Pt's level of assist got progressively worse with more attempts. B LE very weak and near giving out throughout attempts.      Balance Overall balance assessment: Needs assistance Sitting-balance support: Feet supported, Bilateral upper extremity supported Sitting balance-Leahy Scale: Poor Sitting balance - Comments: poor to fair Postural control: Posterior lean Standing balance support: Reliant on assistive device for balance, During functional activity, Bilateral upper extremity supported Standing balance-Leahy Scale: Poor Standing balance comment: poor using RW  ADL either performed or assessed with clinical judgement   ADL Overall ADL's : Needs assistance/impaired     Grooming: Sitting;Moderate assistance   Upper Body Bathing: Minimal assistance;Moderate assistance;Sitting   Lower Body Bathing: Moderate assistance;Maximal assistance;Sitting/lateral leans   Upper Body Dressing : Minimal assistance;Moderate assistance;Sitting   Lower Body  Dressing: Maximal assistance;Moderate assistance;Sitting/lateral leans Lower Body Dressing Details (indicate cue type and reason): Able to doff one sock; assist to don seated in the recliner. Toilet Transfer: Maximal Cabin crew Details (indicate cue type and reason): EOB to chair without RW Toileting- Clothing Manipulation and Hygiene: Maximal assistance;Total assistance;Bed level               Vision Baseline Vision/History: 2 Legally blind Ability to See in Adequate Light: 3 Highly impaired Patient Visual Report: Other (comment) (no change reported; only sees shapes at baseline.) Vision Assessment?:  (baseline deficits)     Perception Perception: Not tested       Praxis Praxis: Not tested       Pertinent Vitals/Pain Pain Assessment Pain Assessment: Faces Faces Pain Scale: Hurts little more Pain Location: R knee with movement Pain Descriptors / Indicators: Guarding, Discomfort Pain Intervention(s): Monitored during session, Repositioned     Extremity/Trunk Assessment Upper Extremity Assessment Upper Extremity Assessment: RUE deficits/detail;LUE deficits/detail RUE Deficits / Details: 2+/5 shoulder flexion bilaterally; generally weak otherwise. Near full P/ROM of shoulder flexion. LUE Deficits / Details: 2+/5 shoulder flexion bilaterally; generally weak otherwise. Near full P/ROM of shoulder flexion.   Lower Extremity Assessment Lower Extremity Assessment: Defer to PT evaluation   Cervical / Trunk Assessment Cervical / Trunk Assessment: Kyphotic   Communication Communication Communication: Impaired Factors Affecting Communication: Hearing impaired   Cognition Arousal: Alert Behavior During Therapy: WFL for tasks assessed/performed Cognition: No apparent impairments                               Following commands: Intact       Cueing  General Comments   Cueing Techniques: Verbal cues;Tactile cues                  Home Living Family/patient expects to be discharged to:: Private residence Living Arrangements: Children Available Help at Discharge: Family;Available 24 hours/day Type of Home: House Home Access: Stairs to enter [employer] of Steps: 1 Entrance Stairs-Rails: Right;Left Home Layout: One level     Bathroom Shower/Tub: Chief Strategy Officer: Standard Bathroom Accessibility: No   Home Equipment: Agricultural consultant (2 wheels);Cane - single point;Shower seat   Additional Comments: per chart and daughter's report      Prior Functioning/Environment Prior Level of Function : Needs assist       Physical Assist : ADLs (physical) Mobility (physical): Transfers ADLs (physical): IADLs;Dressing Mobility Comments: community ambulator using RW ADLs Comments: Assist for dressing PRN; assist for shower transfer.    OT Problem List: Decreased strength;Decreased range of motion;Impaired balance (sitting and/or standing);Decreased activity tolerance;Impaired vision/perception;Decreased knowledge of use of DME or AE   OT Treatment/Interventions: Self-care/ADL training;Therapeutic exercise;Therapeutic activities;Patient/family education;Balance training;DME and/or AE instruction;Visual/perceptual remediation/compensation;Energy conservation      OT Goals(Current goals can be found in the care plan section)   Acute Rehab OT Goals Patient Stated Goal: Improve function OT Goal Formulation: With patient/family Time For Goal Achievement: 04/14/24 Potential to Achieve Goals: Fair   OT Frequency:  Min 3X/week    Co-evaluation PT/OT/SLP Co-Evaluation/Treatment: Yes Reason for Co-Treatment: To address  functional/ADL transfers   OT goals addressed during session: ADL's and self-care                       End of Session Equipment Utilized During Treatment: Rolling walker (2 wheels);Gait belt  Activity Tolerance: Patient tolerated treatment well Patient left:  in chair;with call bell/phone within reach;with family/visitor present  OT Visit Diagnosis: Unsteadiness on feet (R26.81);Other abnormalities of gait and mobility (R26.89);Muscle weakness (generalized) (M62.81)                Time: 9147-9084 OT Time Calculation (min): 23 min Charges:  OT General Charges $OT Visit: 1 Visit OT Evaluation $OT Eval Low Complexity: 1 Low  Conchita Truxillo OT, MOT  Jayson Person 03/31/2024, 9:59 AM

## 2024-03-31 NOTE — Care Management Important Message (Signed)
 Important Message  Patient Details  Name: [name] MRN: [id number] Date of Birth: 17-Aug-1926   Important Message Given:  Yes - Medicare IM     [name] 03/31/2024, 12:16 PM

## 2024-03-31 NOTE — Telephone Encounter (Signed)
 Pt currently in hospital at this time. Will place lab orders and call family once pt is discharged

## 2024-03-31 NOTE — Plan of Care (Signed)
  Problem: Acute Rehab OT Goals (only OT should resolve) Goal: Pt. Will Perform Grooming Flowsheets (Taken 03/31/2024 1001) Pt Will Perform Grooming:  with modified independence  sitting Goal: Pt. Will Perform Upper Body Dressing Flowsheets (Taken 03/31/2024 1001) Pt Will Perform Upper Body Dressing:  with modified independence  sitting Goal: Pt. Will Perform Lower Body Dressing Flowsheets (Taken 03/31/2024 1001) Pt Will Perform Lower Body Dressing:  with contact guard assist  sitting/lateral leans Goal: Pt. Will Transfer To Toilet Flowsheets (Taken 03/31/2024 1001) Pt Will Transfer to Toilet:  with contact guard assist  with min assist  stand pivot transfer Goal: Pt. Will Perform Toileting-Clothing Manipulation Flowsheets (Taken 03/31/2024 1001) Pt Will Perform Toileting - Clothing Manipulation and hygiene:  with contact guard assist  with min assist  sitting/lateral leans Goal: Pt/Caregiver Will Perform Home Exercise Program Flowsheets (Taken 03/31/2024 1001) Pt/caregiver will Perform Home Exercise Program:  Increased ROM  Increased strength  Both right and left upper extremity  Independently  [name] OT, MOT

## 2024-03-31 NOTE — Telephone Encounter (Signed)
 Per daughter, she wanted cardiology to know that patient's lasix  has been held since last Wednesday and is currently admitted. Reports weight gain and swelling since lasix  is being held. Advised that she did need to share her concerns with the clinical team who is providing direct care while inpatient. Advised that this message would be sent to provider as an FYI. Verbalized understanding.

## 2024-03-31 NOTE — Progress Notes (Signed)
 Physical Therapy Treatment Patient Details Name: [name] MRN: [id number] DOB: 1926-07-14 Today's Date: 03/31/2024   History of Present Illness [name] is a 89 y.o. female with medical history significant for hypertension, hyperlipidemia, CKD stage IV, PAF on Coumadin , symptomatic bradycardia status post PPM, and recent UTI for which she has just completed a course of Augmentin  and now presents with worsening fatigue, generalized weakness, loss of appetite, chills, nausea, and vomiting.     Her daughters are at the bedside and assists with the history.  She has had a poor appetite for more than a week now, and has had fatigue and generalized weakness which has worsened significantly over the past day or so.  She also had vomiting today and chills.  She rarely complains of anything and does her best to avoid the hospital but asked her daughters to call an ambulance for her today which was very unusual.    PT Comments  Patient pleasant and agreeable to today's treatment session. Patient required modA-maxA to complete all functional mobility tasks but was unable to ambulate despite multiple attempts. Patient presents with increasing generalized weakness resulting in knee buckling and posterior lean that significantly impacts her ambulatory abilities. Patient left in chair with call bell in reach and family member present at the conclusion of the session. Patient will benefit from continued skilled physical therapy in hospital and recommended venue below to increase strength, balance, endurance for safe ADLs and gait.     If plan is discharge home, recommend the following: A lot of help with walking and/or transfers;Help with stairs or ramp for entrance;Assist for transportation   Can travel by private vehicle        Equipment Recommendations  None recommended by PT    Recommendations for Other Services       Precautions / Restrictions Precautions Precautions: Fall Recall of  Precautions/Restrictions: Intact Restrictions Weight Bearing Restrictions Per Provider Order: No     Mobility  Bed Mobility Overal bed mobility: Needs Assistance Bed Mobility: Supine to Sit     Supine to sit: Max assist, Mod assist     General bed mobility comments: slow labored movement to sit upright and scoot EOB, physical assist to move LEs    Transfers Overall transfer level: Needs assistance Equipment used: Rolling walker (2 wheels) Transfers: Sit to/from Stand, Bed to chair/wheelchair/BSC Sit to Stand: Mod assist, Max assist Stand pivot transfers: Max assist         General transfer comment: ~3 reps of attempted sit to stand with RW before transfer to chair without RW and max A. Pt's level of assist got progressively worse with more attempts. B LE very weak and near giving out throughout attempts.    Ambulation/Gait                   Stairs             Wheelchair Mobility     Tilt Bed    Modified Rankin (Stroke Patients Only)       Balance Overall balance assessment: Needs assistance Sitting-balance support: Feet supported, Bilateral upper extremity supported Sitting balance-Leahy Scale: Poor Sitting balance - Comments: poor to fair seated EOB Postural control: Posterior lean Standing balance support: Reliant on assistive device for balance, During functional activity, Bilateral upper extremity supported Standing balance-Leahy Scale: Poor Standing balance comment: poor using RW  Communication Communication Communication: Impaired Factors Affecting Communication: Hearing impaired  Cognition Arousal: Alert Behavior During Therapy: WFL for tasks assessed/performed   PT - Cognitive impairments: No apparent impairments                         Following commands: Intact      Cueing Cueing Techniques: Verbal cues, Tactile cues  Exercises      General Comments        Pertinent  Vitals/Pain Pain Assessment Pain Assessment: Faces Faces Pain Scale: Hurts little more Pain Location: R knee with movement Pain Descriptors / Indicators: Guarding, Discomfort Pain Intervention(s): Monitored during session, Repositioned    Home Living Family/patient expects to be discharged to:: Private residence Living Arrangements: Children Available Help at Discharge: Family;Available 24 hours/day Type of Home: House Home Access: Stairs to enter Entrance Stairs-Rails: Doctor, general practice of Steps: 1   Home Layout: One level Home Equipment: Agricultural consultant (2 wheels);Cane - single point;Shower seat Additional Comments: per chart and daughter's report    Prior Function            PT Goals (current goals can now be found in the care plan section) Acute Rehab PT Goals Patient Stated Goal: return home PT Goal Formulation: With patient/family Time For Goal Achievement: 04/11/24 Potential to Achieve Goals: Good Progress towards PT goals: Not progressing toward goals - comment (increased weakness)    Frequency    Min 3X/week      PT Plan      Co-evaluation PT/OT/SLP Co-Evaluation/Treatment: Yes Reason for Co-Treatment: To address functional/ADL transfers PT goals addressed during session: Mobility/safety with mobility;Balance;Proper use of DME OT goals addressed during session: ADL's and self-care      AM-PAC PT 6 Clicks Mobility   Outcome Measure  Help needed turning from your back to your side while in a flat bed without using bedrails?: A Little Help needed moving from lying on your back to sitting on the side of a flat bed without using bedrails?: A Lot Help needed moving to and from a bed to a chair (including a wheelchair)?: A Lot Help needed standing up from a chair using your arms (e.g., wheelchair or bedside chair)?: A Lot Help needed to walk in hospital room?: A Lot Help needed climbing 3-5 steps with a railing? : Total 6 Click Score:  12    End of Session Equipment Utilized During Treatment: Gait belt Activity Tolerance: Patient limited by fatigue (Limited by fatigue and weakness) Patient left: in chair;with family/visitor present;with call bell/phone within reach Nurse Communication: Mobility status PT Visit Diagnosis: Muscle weakness (generalized) (M62.81);Other abnormalities of gait and mobility (R26.89);Unsteadiness on feet (R26.81)     Time: 9148-9083 PT Time Calculation (min) (ACUTE ONLY): 25 min  Charges:    $Therapeutic Activity: 23-37 mins                       12:36 PM, 03/31/24,  Onnie Como, SPT

## 2024-03-31 NOTE — Progress Notes (Signed)
 Progress Note   Patient: [name] FMW:[id number] DOB: 23-Apr-1926 DOA: 03/26/2024     4 DOS: the patient was seen and examined on 03/31/2024   Brief hospital admission narrative: As per H&P written by Dr. Charlton on 03/26/2024 [name] is a 89 y.o. female with medical history significant for hypertension, hyperlipidemia, CKD stage IV, PAF on Coumadin , symptomatic bradycardia status post PPM, and recent UTI for which she has just completed a course of Augmentin  and now presents with worsening fatigue, generalized weakness, loss of appetite, chills, nausea, and vomiting.   Her daughters are at the bedside and assists with the history.  She has had a poor appetite for more than a week now, and has had fatigue and generalized weakness which has worsened significantly over the past day or so.  She also had vomiting today and chills.  She rarely complains of anything and does her best to avoid the hospital but asked her daughters to call an ambulance for her today which was very unusual.   ED Course: Upon arrival to the ED, patient is found to be afebrile and saturating mid 90s on room air with normal HR and SBP ranging from 80-120.  Labs are most notable for creatinine 2.28, alkaline phosphatase 514, AST 383, ALT 173, total bilirubin 1.8, WBC 19,900, normal lactic acid, and INR 5.1.  CT demonstrates distended gallbladder and increased CBD diameter, now at 15 mm.   Blood and urine cultures were collected in the ED, 1.5 L IVF was administered, the patient was also given acetaminophen , Zofran , Rocephin , and Flagyl .  Assessment and plan 1-elevated LFTs/CBD dilatation and concern for cholangitis - Right upper quadrant ultrasound with concerns for ongoing retained stone; but that has mainly been clear by MRCP. - Will continue antibiotics, fluid resuscitation and continue slowly advancing diet; with plans to advance to soft diet today. -per GI recommendations patient has started treatment with  ursodiol  -Continue supportive care and current management. - MRCP did not demonstrate the presence of retained stones or large masses; the study was compromised by movement degradation & artifacts.  CBD was visualized to be dilated. - Continue to follow clinical response. - Bilirubin has normalized and patient's LFTs continue trending down (AST and ALT now within normal limits); WBCs also demonstrating improvement  2-acute kidney injury superimposed on chronic kidney disease stage IV - In the setting of continued nephrotoxic agents and prerenal azotemia - Continue to maintain adequate hydration - Continue holding diuretics and nephrotoxic agents at the moment. -Continue to follow renal function trend intermittently. - Continue avoiding the use of contrast and avoid hypotension.  3-paroxysmal atrial fibrillation/supratherapeutic INR/pacemaker implantation - INR is 5.3>> 4.0>> 2.3 >>2.0 >> 1.8 - Pharmacy to continue assisting with Coumadin  dosage. - No signs of overt bleeding currently appreciated. -Patient's heart rate has remained stable and will be safe to discontinue telemetry. - Continue the use of diltiazem  and metoprolol  for rate control.  4-hyperlipidemia - Continue holding statin in the setting of transaminitis - Follow trend and resume when appropriate.  5-generalized weakness/physical deconditioning - Patient seen by physical therapy with recommendation for skilled nursing facility at discharge - Family in agreement - TOC aware and helping with placement. -Patient medically stable for discharge when SNF bed available and insurance authorization acquired.  6-chronic diastolic heart failure - Stable and compensated - Continue to follow daily weights and strict I's and O's.  7-insomnia - Continue as needed trazodone  has been ordered.  8-anemia of chronic disease - No upper  bleeding appreciated - Continue to follow hemoglobin trend intermittently - At the moment no  transfusion required.  Subjective:  No fever, no chest pain, no nausea, no vomiting, no abdominal pain.  Patient had bowel movement and so far tolerating diet.  Physical Exam: Vitals:   03/30/24 0538 03/30/24 1251 03/30/24 1950 03/31/24 0519  BP: (!) 119/56 (!) 112/59 (!) 143/77 (!) 140/66  Pulse: 73 75 66 75  Resp: 20  19 18   Temp: 98.4 F (36.9 C) 98.1 F (36.7 C) 98.1 F (36.7 C) 98.1 F (36.7 C)  TempSrc: Oral Oral Oral Oral  SpO2: 96% 93% 96% 95%  Weight:      Height:       General exam: Alert, awake, following commands appropriately and in no acute distress. Respiratory system: Good air movement bilaterally.  No wheezing or crackles appreciated on exam. Cardiovascular system: Rate controlled, no rubs, no gallops, no JVD. Gastrointestinal system: Abdomen is nondistended, soft and nontender.  Positive bowel sounds. Central nervous system: Generally weak; moving 4 limbs spontaneously.  No focal neurological deficits. Extremities: No cyanosis or clubbing. Skin: No r petechiae. Psychiatry: Mood and affect appropriate.  Latest data Reviewed: Comprehensive metabolic panel: Sodium 136, potassium 3.6, chloride 108, bicarb 19, BUN 23, creatinine 1.95, AST 17, ALT 42, alk phos 232 and GFR 23.  Anion gap 9 INR: 2.0>>1.8 Magnesium: 1.9 CBC: WBCs 12.4, hemoglobin 8.4 and platelet count 291 K   Family Communication: Ganddaughter at bedside.  Disposition: Status is: Inpatient Remains inpatient appropriate because: Continue IV therapy.  Anticipating discharge back home once medically stable.  Time spent: 50 minutes  Author: Eric Nunnery, MD 03/31/2024 1:32 PM  For on call review [email or web address].

## 2024-03-31 NOTE — Plan of Care (Signed)

## 2024-03-31 NOTE — TOC Progression Note (Addendum)
 Transition of Care [facility]) - Progression Note    Patient Details  Name: [name] MRN: [id number] Date of Birth: August 08, 1926  Transition of Care [facility]) CM/SW Contact  Hayley DELENA Bigness, LCSW Phone Number: 03/31/2024, 12:37 PM  Clinical Narrative:    Have reviewed bed offers for SNF w/ pt's daughter, [name]. [name] to review with her other sisters and will reach back out to CSW with choice.   [facility] and [facility] [address] [place], [place] [address] [phone] Overall rating ?????  [facility] [address] [place], [place] [address] [phone] Overall rating ?  [facility] [address] [place], [place] [address] [phone] Overall rating ???  [facility] and [facility] [address] [place], [place] [address] [phone] Overall rating ??  [facility] [address] [place], [place] [address] [phone] Overall rating ?????    Expected Discharge Plan: Skilled Nursing Facility Barriers to Discharge: No SNF bed, Continued Medical Work up               Expected Discharge Plan and Services In-house Referral: Clinical Social Work Discharge Planning Services: CM Consult Post Acute Care Choice: Skilled Nursing Facility Living arrangements for the past 2 months: Single Family Home                                       Social Drivers of Health (SDOH) Interventions SDOH Screenings   Food Insecurity: No Food Insecurity (03/27/2024)  Housing: Low Risk  (03/27/2024)  Transportation Needs: No Transportation Needs (03/27/2024)  Utilities: Not At Risk (03/27/2024)  Alcohol Screen: Low Risk  (09/04/2023)  Depression (PHQ2-9): Low Risk  (03/19/2024)  Financial Resource Strain: Low Risk  (09/04/2023)  Physical Activity: Inactive (09/04/2023)  Social Connections: Socially Isolated (03/27/2024)  Stress: No Stress Concern Present (09/04/2023)   Tobacco Use: Low Risk  (03/27/2024)  Health Literacy: Inadequate Health Literacy (09/04/2023)    Readmission Risk Interventions    03/29/2024   11:00 AM 03/28/2024    3:19 PM  Readmission Risk Prevention Plan  Transportation Screening Complete Complete  Home Care Screening Complete Complete  Medication Review (RN CM) Complete Complete

## 2024-04-01 ENCOUNTER — Encounter: Payer: Self-pay | Admitting: Family Medicine

## 2024-04-01 DIAGNOSIS — N179 Acute kidney failure, unspecified: Secondary | ICD-10-CM | POA: Diagnosis not present

## 2024-04-01 DIAGNOSIS — K831 Obstruction of bile duct: Secondary | ICD-10-CM | POA: Diagnosis not present

## 2024-04-01 DIAGNOSIS — E7849 Other hyperlipidemia: Secondary | ICD-10-CM | POA: Diagnosis not present

## 2024-04-01 DIAGNOSIS — E038 Other specified hypothyroidism: Secondary | ICD-10-CM | POA: Diagnosis not present

## 2024-04-01 LAB — CBC
HCT: 26.2 % — ABNORMAL LOW (ref 36.0–46.0)
Hemoglobin: 8.3 g/dL — ABNORMAL LOW (ref 12.0–15.0)
MCH: 30.1 pg (ref 26.0–34.0)
MCHC: 31.7 g/dL (ref 30.0–36.0)
MCV: 94.9 fL (ref 80.0–100.0)
Platelets: 277 K/uL (ref 150–400)
RBC: 2.76 MIL/uL — ABNORMAL LOW (ref 3.87–5.11)
RDW: 13.7 % (ref 11.5–15.5)
WBC: 12 K/uL — ABNORMAL HIGH (ref 4.0–10.5)
nRBC: 0 % (ref 0.0–0.2)

## 2024-04-01 LAB — COMPREHENSIVE METABOLIC PANEL WITH GFR
ALT: 30 U/L (ref 0–44)
ALT: 25 U/L (ref 0–44)
AST: 14 U/L — ABNORMAL LOW (ref 15–41)
AST: 12 U/L — ABNORMAL LOW (ref 15–41)
Albumin: 2.1 g/dL — ABNORMAL LOW (ref 3.5–5.0)
Albumin: 2.1 g/dL — ABNORMAL LOW (ref 3.5–5.0)
Alkaline Phosphatase: 198 U/L — ABNORMAL HIGH (ref 38–126)
Alkaline Phosphatase: 188 U/L — ABNORMAL HIGH (ref 38–126)
Anion gap: 10 (ref 5–15)
Anion gap: 10 (ref 5–15)
BUN: 24 mg/dL — ABNORMAL HIGH (ref 8–23)
BUN: 25 mg/dL — ABNORMAL HIGH (ref 8–23)
CO2: 21 mmol/L — ABNORMAL LOW (ref 22–32)
CO2: 19 mmol/L — ABNORMAL LOW (ref 22–32)
Calcium: 8.6 mg/dL — ABNORMAL LOW (ref 8.9–10.3)
Calcium: 8.3 mg/dL — ABNORMAL LOW (ref 8.9–10.3)
Chloride: 102 mmol/L (ref 98–111)
Chloride: 106 mmol/L (ref 98–111)
Creatinine, Ser: 2.17 mg/dL — ABNORMAL HIGH (ref 0.44–1.00)
Creatinine, Ser: 2.13 mg/dL — ABNORMAL HIGH (ref 0.44–1.00)
GFR, Estimated: 20 mL/min — ABNORMAL LOW (ref >60)
GFR, Estimated: 21 mL/min — ABNORMAL LOW (ref >60)
Glucose, Bld: 95 mg/dL (ref 70–99)
Glucose, Bld: 117 mg/dL — ABNORMAL HIGH (ref 70–99)
Potassium: 3.8 mmol/L (ref 3.5–5.1)
Potassium: 3.7 mmol/L (ref 3.5–5.1)
Sodium: 133 mmol/L — ABNORMAL LOW (ref 135–145)
Sodium: 135 mmol/L (ref 135–145)
Total Bilirubin: 1 mg/dL (ref 0.0–1.2)
Total Bilirubin: 1 mg/dL (ref 0.0–1.2)
Total Protein: 5.8 g/dL — ABNORMAL LOW (ref 6.5–8.1)
Total Protein: 6.1 g/dL — ABNORMAL LOW (ref 6.5–8.1)

## 2024-04-01 LAB — PROTIME-INR
INR: 2.3 — ABNORMAL HIGH (ref 0.8–1.2)
Prothrombin Time: 26.7 s — ABNORMAL HIGH (ref 11.4–15.2)

## 2024-04-01 MED ORDER — WARFARIN SODIUM 2.5 MG PO TABS
2.5000 mg | ORAL_TABLET | Freq: Once | ORAL | Status: AC
  Administered 2024-04-01: 2.5 mg via ORAL
  Filled 2024-04-01: qty 1

## 2024-04-01 MED ORDER — METRONIDAZOLE 500 MG PO TABS
500.0000 mg | ORAL_TABLET | Freq: Two times a day (BID) | ORAL | Status: DC
  Administered 2024-04-01 – 2024-04-03 (×4): 500 mg via ORAL
  Filled 2024-04-01 (×4): qty 1

## 2024-04-01 MED ORDER — CEFUROXIME AXETIL 250 MG PO TABS
250.0000 mg | ORAL_TABLET | Freq: Two times a day (BID) | ORAL | Status: DC
  Administered 2024-04-01 – 2024-04-03 (×4): 250 mg via ORAL
  Filled 2024-04-01 (×4): qty 1

## 2024-04-01 MED ORDER — CEFPODOXIME PROXETIL 100 MG/5ML PO SUSR: 100.0000 mg | Freq: Two times a day (BID) | ORAL | Status: DC

## 2024-04-01 NOTE — Progress Notes (Signed)
 PHARMACY - ANTICOAGULATION CONSULT NOTE  Pharmacy Consult for warfarin  Indication: atrial fibrillation  No Known Allergies  Patient Measurements: Height: 5' 6 (167.6 cm) Weight: 80.3 kg (177 lb 0.5 oz) IBW/kg (Calculated) : 59.3 HEPARIN DW (KG): 76  Vital Signs: Temp: 98.4 F (36.9 C) (08/05 0548) BP: 132/77 (08/05 0548) Pulse Rate: 75 (08/05 0548)  Labs: Recent Labs    03/30/24 0340 03/31/24 0809 04/01/24 0450  HGB 8.4*  --  8.3*  HCT 26.9*  --  26.2*  PLT 291  --  277  LABPROT 23.9* 22.2* 26.7*  INR 2.0* 1.8* 2.3*  CREATININE 2.05* 1.95* 2.17*    Estimated Creatinine Clearance: 16.2 mL/min (A) (by C-G formula based on SCr of 2.17 mg/dL (H)).   Medical History: Past Medical History:  Diagnosis Date   Allergy    Annual physical exam 04/22/2015   Anxiety    Arthritis    Arthritis    Asthma    Atrial fibrillation (HCC)    Bleeding disorder (HCC)    CHF (congestive heart failure) (HCC)    CKD (chronic kidney disease) stage 4, GFR 15-29 ml/min (HCC) 09/08/2021   COPD (chronic obstructive pulmonary disease) (HCC)    Depression    Gastroesophageal reflux disease    Hearing impairment    Heart disease    Heart murmur    High cholesterol    Hyperlipidemia    Hypertension    Hypothyroidism    Impaired glucose tolerance    Macular degeneration    Oxygen  deficiency    Pancreatitis    Paroxysmal atrial fibrillation (HCC) 2011   Sick sinus syndrome (HCC) 2011   Atrial fibrilation 10/2009; and dual chamber pacemaker in 4/11; normal EF   Sleep apnea    Nocturnal oxygen  therapy   Zoster 2016   Assessment: 89 year old female on warfarin for afib. Warfarin was held on admit due to elevated INR and the need for procedures. Discussed with physician today and will start back warfarin tonight. INR was 5.1 on admit. INR now trending up after boosted dose 8/3 to 2.3. CBC has remained stable overnight, no bleeding noted.   Patient take 5mg  on Mondays and 2.5mg  all  other days.   Goal of Therapy:  INR 2-3 Monitor platelets by anticoagulation protocol: Yes   Plan:  Will continue home dose today with 2.5mg   Monitor INR daily and s/s of bleeding.  Dempsey Blush PharmD., BCPS Clinical Pharmacist 04/01/2024 10:02 AM

## 2024-04-01 NOTE — Telephone Encounter (Signed)
 Patient still admitted to the hospital as of 04/01/2024 at 10:42 am.

## 2024-04-01 NOTE — Care Management Important Message (Deleted)
 Important Message  Patient Details  Name: [name] MRN: [id number] Date of Birth: 28-Apr-1926   Important Message Given:  Yes - Medicare IM     Jayton Popelka L Stevon Gough 04/01/2024, 1:46 PM

## 2024-04-01 NOTE — Plan of Care (Signed)

## 2024-04-01 NOTE — Progress Notes (Signed)
 Progress Note   Patient: [name] FMW:[id number] DOB: May 17, 1926 DOA: 03/26/2024     5 DOS: the patient was seen and examined on 04/01/2024   Brief hospital admission narrative: As per H&P written by Dr. Charlton on 03/26/2024 [name] is a 89 y.o. female with medical history significant for hypertension, hyperlipidemia, CKD stage IV, PAF on Coumadin , symptomatic bradycardia status post PPM, and recent UTI for which she has just completed a course of Augmentin  and now presents with worsening fatigue, generalized weakness, loss of appetite, chills, nausea, and vomiting.   Her daughters are at the bedside and assists with the history.  She has had a poor appetite for more than a week now, and has had fatigue and generalized weakness which has worsened significantly over the past day or so.  She also had vomiting today and chills.  She rarely complains of anything and does her best to avoid the hospital but asked her daughters to call an ambulance for her today which was very unusual.   ED Course: Upon arrival to the ED, patient is found to be afebrile and saturating mid 90s on room air with normal HR and SBP ranging from 80-120.  Labs are most notable for creatinine 2.28, alkaline phosphatase 514, AST 383, ALT 173, total bilirubin 1.8, WBC 19,900, normal lactic acid, and INR 5.1.  CT demonstrates distended gallbladder and increased CBD diameter, now at 15 mm.   Blood and urine cultures were collected in the ED, 1.5 L IVF was administered, the patient was also given acetaminophen , Zofran , Rocephin , and Flagyl .  Assessment and plan 1-elevated LFTs/CBD dilatation and concern for cholangitis - Right upper quadrant ultrasound with concerns for ongoing retained stone; but that has mainly been clear by MRCP. - So far tolerating diet without problems - Antibiotics will be transitioned to oral route using third-generation cephalosporin and Flagyl . (Plan is for a total of 10 days abx's; currently day  5/10) -per GI recommendations patient has started treatment with ursodiol  -Continue supportive care and current management. - MRCP did not demonstrate the presence of retained stones or large masses; the study was compromised by movement degradation & artifacts.  CBD was visualized to be dilated. - Continue to follow clinical response. - Bilirubin has normalized and patient's LFTs continue trending down (AST and ALT now within normal limits as well); WBCs also demonstrating continue improvement.  2-acute kidney injury superimposed on chronic kidney disease stage IV - In the setting of continued nephrotoxic agents and prerenal azotemia - Continue to maintain adequate hydration - Continue holding diuretics and nephrotoxic agents at the moment. -Continue to follow renal function trend intermittently. - Continue avoiding the use of contrast and avoid hypotension.  3-paroxysmal atrial fibrillation/supratherapeutic INR/pacemaker implantation - INR is 5.3>> 4.0>> 2.3 >>2.0 >> 1.8>> 2.3 - Pharmacy to continue assisting with Coumadin  dosage. - No signs of overt bleeding currently appreciated. -Patient's heart rate has remained stable and will be safe to discontinue telemetry. - Continue the use of diltiazem  and metoprolol  for rate control.  4-hyperlipidemia - Continue holding statin in the setting of transaminitis - Follow trend and resume when appropriate.  5-generalized weakness/physical deconditioning - Patient seen by physical therapy with recommendation for skilled nursing facility at discharge - Family in agreement - TOC aware and helping with placement. -Patient medically stable for discharge when SNF bed available and insurance authorization acquired.  6-chronic diastolic heart failure - Stable and compensated - Continue to follow daily weights and strict I's and O's.  7-insomnia - Continue as needed trazodone  has been ordered.  8-anemia of chronic disease - No upper bleeding  appreciated - Continue to follow hemoglobin trend intermittently - At the moment no transfusion required.  Subjective:  Afebrile, no chest pain, no nausea, no vomiting.  Tolerating diet and having bowel movement.  Continue improvement appreciated on patient's LFTs.  Planning for transition of antibiotics to oral route to assess tolerance and pursuit placement to skilled nursing facility for rehabilitation on a 625  Physical Exam: Vitals:   03/31/24 1841 03/31/24 2041 04/01/24 0548 04/01/24 1220  BP:  (!) 128/58 132/77 126/66  Pulse:  74 75 66  Resp:  19 18 (!) 24  Temp:  99 F (37.2 C) 98.4 F (36.9 C) 100.1 F (37.8 C)  TempSrc:    Oral  SpO2: 96%  98% 97%  Weight:      Height:       General exam: Alert, awake, following commands appropriately and reporting feeling weak and tired. Respiratory system: No using accessory muscles; no wheezing or crackles on exam. Cardiovascular system: Rate controlled, no rubs, no gallops, no JVD. Gastrointestinal system: Abdomen is nondistended, soft and nontender.  Positive bowel sounds appreciated on exam.  Patient reported having bowel movement. Central nervous system: Moving 4 limbs spontaneously.  No focal neurological deficits. Extremities: No cyanosis or clubbing. Skin: No petechiae. Psychiatry: Mood & affect appropriate.   Latest data Reviewed: Comprehensive metabolic panel: Sodium 133, potassium 3.8, chloride 102, bicarb 21, BUN 24, creatinine 2.17, AST 14, ALT 30, alk phos 198, total bilirubin 1.0 and GFR 20 CBC: WBCs 12.0, hemoglobin 8.3 and platelet count 277K INR: 2.0>>1.8 >>2.3 Magnesium: 1.9    Family Communication: Daughter at bedside.  Disposition: Status is: Inpatient Remains inpatient appropriate because: Continue IV therapy.  Anticipating discharge back home once medically stable.  Time spent: 50 minutes  Author: Eric Nunnery, MD 04/01/2024 4:18 PM  For on call review [email or web address].

## 2024-04-01 NOTE — Progress Notes (Signed)
 OT Cancellation Note  Patient Details Name: [name] MRN: [id number] DOB: 1926-07-20   Cancelled Treatment:    Reason Eval/Treat Not Completed: Patient declined, no reason specified. Pt declined to participate despite multiple attempts today. Will attempt to treat the pt later as time permits.   Aseel Truxillo OT, MOT   Jayson Person 04/01/2024, 3:45 PM

## 2024-04-01 NOTE — TOC Progression Note (Signed)
 Transition of Care [facility]) - Progression Note    Patient Details  Name: [name] MRN: [id number] Date of Birth: [birth year]  [facility]) CM/SW Contact  Hoy DELENA Bigness, LCSW Phone Number: 04/01/2024, 11:41 AM  Clinical Narrative:    [facility] approved for SNF. Spoke with pt's daughter, [name], and confirmed placement at [facility]. [facility] currently does not have access to the [facility]UB and will need DC information faxed to them at discharge to [phone].   Expected Discharge Plan: Skilled Nursing Facility Barriers to Discharge: No SNF bed, Continued Medical Work up               Expected Discharge Plan and Services In-house Referral: Clinical Social Work Discharge Planning Services: CM Consult Post Acute Care Choice: Skilled Nursing Facility Living arrangements for the past 2 months: Single Family Home                                       Social Drivers of Health (SDOH) Interventions SDOH Screenings   Food Insecurity: No Food Insecurity (03/27/2024)  Housing: Low Risk  (03/27/2024)  Transportation Needs: No Transportation Needs (03/27/2024)  Utilities: Not At Risk (03/27/2024)  Alcohol Screen: Low Risk  (09/04/2023)  Depression (PHQ2-9): Low Risk  (03/19/2024)  Financial Resource Strain: Low Risk  (09/04/2023)  Physical Activity: Inactive (09/04/2023)  Social Connections: Socially Isolated (03/27/2024)  Stress: No Stress Concern Present (09/04/2023)  Tobacco Use: Low Risk  (03/27/2024)  Health Literacy: Inadequate Health Literacy (09/04/2023)    Readmission Risk Interventions    04/01/2024   11:40 AM 03/29/2024   11:00 AM 03/28/2024    3:19 PM  Readmission Risk Prevention Plan  Transportation Screening Complete Complete Complete  PCP or Specialist Appt within 5-7 Days Complete    Home Care Screening Complete Complete Complete  Medication Review (RN CM) Complete Complete Complete

## 2024-04-02 ENCOUNTER — Inpatient Hospital Stay (HOSPITAL_COMMUNITY)

## 2024-04-02 DIAGNOSIS — N184 Chronic kidney disease, stage 4 (severe): Secondary | ICD-10-CM | POA: Diagnosis not present

## 2024-04-02 DIAGNOSIS — N179 Acute kidney failure, unspecified: Secondary | ICD-10-CM | POA: Diagnosis not present

## 2024-04-02 LAB — CBC
HCT: 26.1 % — ABNORMAL LOW (ref 36.0–46.0)
Hemoglobin: 8.7 g/dL — ABNORMAL LOW (ref 12.0–15.0)
MCH: 31.2 pg (ref 26.0–34.0)
MCHC: 33.3 g/dL (ref 30.0–36.0)
MCV: 93.5 fL (ref 80.0–100.0)
Platelets: 303 K/uL (ref 150–400)
RBC: 2.79 MIL/uL — ABNORMAL LOW (ref 3.87–5.11)
RDW: 13.9 % (ref 11.5–15.5)
WBC: 14.1 K/uL — ABNORMAL HIGH (ref 4.0–10.5)
nRBC: 0 % (ref 0.0–0.2)

## 2024-04-02 LAB — PROTIME-INR
INR: 3.6 — ABNORMAL HIGH (ref 0.8–1.2)
Prothrombin Time: 37.7 s — ABNORMAL HIGH (ref 11.4–15.2)

## 2024-04-02 MED ORDER — IPRATROPIUM-ALBUTEROL 0.5-2.5 (3) MG/3ML IN SOLN
3.0000 mL | Freq: Three times a day (TID) | RESPIRATORY_TRACT | Status: DC
  Administered 2024-04-02 (×2): 3 mL via RESPIRATORY_TRACT
  Filled 2024-04-02 (×2): qty 3

## 2024-04-02 MED ORDER — FUROSEMIDE 10 MG/ML IJ SOLN
40.0000 mg | Freq: Two times a day (BID) | INTRAMUSCULAR | Status: DC
  Administered 2024-04-02: 40 mg via INTRAVENOUS
  Filled 2024-04-02: qty 4

## 2024-04-02 MED ORDER — IPRATROPIUM-ALBUTEROL 0.5-2.5 (3) MG/3ML IN SOLN
3.0000 mL | Freq: Two times a day (BID) | RESPIRATORY_TRACT | Status: DC
  Administered 2024-04-03: 3 mL via RESPIRATORY_TRACT
  Filled 2024-04-02: qty 3

## 2024-04-02 MED ORDER — GUAIFENESIN 100 MG/5ML PO LIQD
10.0000 mL | Freq: Three times a day (TID) | ORAL | Status: DC
  Administered 2024-04-02 – 2024-04-03 (×3): 10 mL via ORAL
  Filled 2024-04-02 (×3): qty 10

## 2024-04-02 NOTE — TOC Progression Note (Signed)
 Transition of Care [facility]) - Progression Note    Patient Details  Name: [name] MRN: [id number] Date of Birth: 1926/03/23  Transition [facility]) CM/SW Contact  Lucie Lunger, [place] Phone Number: 04/02/2024, 10:43 AM  Clinical Narrative:    CSW updated by MD that pt will be ready to D/C to SNF at [facility] tomorrow. CSW updated Destiny in admissions who states that they are able to accept pt tmrw. CSW updated MD that SNF will need D/C summary before 12. TOC to follow.   Expected Discharge Plan: Skilled Nursing Facility Barriers to Discharge: No SNF bed, Continued Medical Work up               Expected Discharge Plan and Services In-house Referral: Clinical Social Work Discharge Planning Services: CM Consult Post Acute Care Choice: Skilled Nursing Facility Living arrangements for the past 2 months: Single Family Home                                       Social Drivers of Health (SDOH) Interventions SDOH Screenings   Food Insecurity: No Food Insecurity (03/27/2024)  Housing: Low Risk  (03/27/2024)  Transportation Needs: No Transportation Needs (03/27/2024)  Utilities: Not At Risk (03/27/2024)  Alcohol Screen: Low Risk  (09/04/2023)  Depression (PHQ2-9): Low Risk  (03/19/2024)  Financial Resource Strain: Low Risk  (09/04/2023)  Physical Activity: Inactive (09/04/2023)  Social Connections: Socially Isolated (03/27/2024)  Stress: No Stress Concern Present (09/04/2023)  Tobacco Use: Low Risk  (03/27/2024)  Health Literacy: Inadequate Health Literacy (09/04/2023)    Readmission Risk Interventions    04/01/2024   11:40 AM 03/29/2024   11:00 AM 03/28/2024    3:19 PM  Readmission Risk Prevention Plan  Transportation Screening Complete Complete Complete  PCP or Specialist Appt within 5-7 Days Complete    Home Care Screening Complete Complete Complete  Medication Review (RN CM) Complete Complete Complete

## 2024-04-02 NOTE — Telephone Encounter (Signed)
 Patient still inpatient as of 04/02/2024 at 9:08 am.

## 2024-04-02 NOTE — Plan of Care (Signed)

## 2024-04-02 NOTE — Progress Notes (Signed)
 Physical Therapy Treatment Patient Details Name: [name] MRN: [id number] DOB: 06-Sep-1925 Today's Date: 04/02/2024   History of Present Illness [name] is a 89 y.o. female with medical history significant for hypertension, hyperlipidemia, CKD stage IV, PAF on Coumadin , symptomatic bradycardia status post PPM, and recent UTI for which she has just completed a course of Augmentin  and now presents with worsening fatigue, generalized weakness, loss of appetite, chills, nausea, and vomiting.     Her daughters are at the bedside and assists with the history.  She has had a poor appetite for more than a week now, and has had fatigue and generalized weakness which has worsened significantly over the past day or so.  She also had vomiting today and chills.  She rarely complains of anything and does her best to avoid the hospital but asked her daughters to call an ambulance for her today which was very unusual.    PT Comments  Patient hesitant but agreeable to session, daughter at bedside reports she is not feeling her best. Patient required mod/maxA for bed mobility during today's session but was able to participate in therapeutic exercises at the EOB with focus on maintaining upright tolerance and core strength. Daughter active participant during session to encourage patient's engagement. Patient will benefit from continued skilled physical therapy in hospital and recommended venue below to increase strength, balance, endurance for safe ADLs and gait.     If plan is discharge home, recommend the following: A lot of help with walking and/or transfers;Help with stairs or ramp for entrance;Assist for transportation;A lot of help with bathing/dressing/bathroom   Can travel by private vehicle     No  Equipment Recommendations  None recommended by PT    Recommendations for Other Services       Precautions / Restrictions Precautions Precautions: Fall Recall of Precautions/Restrictions:  Intact Restrictions Weight Bearing Restrictions Per Provider Order: No     Mobility  Bed Mobility Overal bed mobility: Needs Assistance Bed Mobility: Supine to Sit     Supine to sit: Mod assist, Max assist     General bed mobility comments: slow labored movement to sit upright and scoot EOB, physical assist to move LEs    Transfers                        Ambulation/Gait                   Stairs             Wheelchair Mobility     Tilt Bed    Modified Rankin (Stroke Patients Only)       Balance Overall balance assessment: Needs assistance Sitting-balance support: Feet unsupported, Bilateral upper extremity supported Sitting balance-Leahy Scale: Poor Sitting balance - Comments: poor seated EOB Postural control: Posterior lean   Standing balance-Leahy Scale: Poor Standing balance comment: unable to perform standing tasks due to weakness                            Communication Communication Communication: Impaired Factors Affecting Communication: Hearing impaired  Cognition Arousal: Lethargic Behavior During Therapy: WFL for tasks assessed/performed   PT - Cognitive impairments: No apparent impairments                       PT - Cognition Comments: Daughter at bedside Following commands: Intact      Cueing Cueing  Techniques: Verbal cues, Tactile cues  Exercises General Exercises - Lower Extremity Ankle Circles/Pumps: Both, Seated, 10 reps, AROM Long Arc Quad: 10 reps, AROM, Both, Seated Hip Flexion/Marching: 10 reps, AROM, Both, Seated    General Comments General comments (skin integrity, edema, etc.): per daughter, knees more swollen than normal      Pertinent Vitals/Pain Pain Assessment Faces Pain Scale: Hurts little more Pain Location: R knee with movement Pain Descriptors / Indicators: Guarding, Discomfort Pain Intervention(s): Limited activity within patient's tolerance, Monitored during session     Home Living Family/patient expects to be discharged to:: Private residence Living Arrangements: Children Available Help at Discharge: Family;Available 24 hours/day Type of Home: House Home Access: Stairs to enter Entrance Stairs-Rails: Doctor, general practice of Steps: 1   Home Layout: One level Home Equipment: Agricultural consultant (2 wheels);Cane - single point;Shower seat Additional Comments: per chart and daughter's report    Prior Function            PT Goals (current goals can now be found in the care plan section) Acute Rehab PT Goals Patient Stated Goal: return home PT Goal Formulation: With patient/family Time For Goal Achievement: 04/11/24 Potential to Achieve Goals: Fair Progress towards PT goals: Not progressing toward goals - comment    Frequency    Min 3X/week      PT Plan      Co-evaluation     PT goals addressed during session: Mobility/safety with mobility;Balance        AM-PAC PT 6 Clicks Mobility   Outcome Measure  Help needed turning from your back to your side while in a flat bed without using bedrails?: A Lot Help needed moving from lying on your back to sitting on the side of a flat bed without using bedrails?: A Lot Help needed moving to and from a bed to a chair (including a wheelchair)?: A Lot Help needed standing up from a chair using your arms (e.g., wheelchair or bedside chair)?: A Lot Help needed to walk in hospital room?: A Lot Help needed climbing 3-5 steps with a railing? : Total 6 Click Score: 11    End of Session Equipment Utilized During Treatment: Gait belt Activity Tolerance: Patient limited by fatigue;Patient limited by lethargy Patient left: in bed;with family/visitor present;with call bell/phone within reach Nurse Communication: Mobility status PT Visit Diagnosis: Muscle weakness (generalized) (M62.81);Other abnormalities of gait and mobility (R26.89);Unsteadiness on feet (R26.81)     Time: 8544-8487 PT  Time Calculation (min) (ACUTE ONLY): 17 min  Charges:    $Therapeutic Activity: 8-22 mins PT General Charges $$ ACUTE PT VISIT: 1 Visit                     3:37 PM, 04/02/24,  Marquette Blodgett, SPT

## 2024-04-02 NOTE — Plan of Care (Signed)
  Problem: Education: Goal: Knowledge of General Education information will improve Description: Including pain rating scale, medication(s)/side effects and non-pharmacologic comfort measures 04/02/2024 1905 by Joshua Alycia BRAVO, RN Outcome: Progressing 04/02/2024 1904 by Joshua Alycia BRAVO, RN Outcome: Progressing   Problem: Health Behavior/Discharge Planning: Goal: Ability to manage health-related needs will improve 04/02/2024 1905 by Joshua Alycia BRAVO, RN Outcome: Progressing 04/02/2024 1904 by Joshua Alycia BRAVO, RN Outcome: Progressing   Problem: Clinical Measurements: Goal: Ability to maintain clinical measurements within normal limits will improve 04/02/2024 1905 by Joshua Alycia BRAVO, RN Outcome: Progressing 04/02/2024 1904 by Joshua Alycia BRAVO, RN Outcome: Progressing Goal: Will remain free from infection 04/02/2024 1905 by Joshua Alycia BRAVO, RN Outcome: Progressing 04/02/2024 1904 by Joshua Alycia BRAVO, RN Outcome: Progressing Goal: Diagnostic test results will improve 04/02/2024 1905 by Joshua Alycia BRAVO, RN Outcome: Progressing 04/02/2024 1904 by Joshua Alycia BRAVO, RN Outcome: Progressing Goal: Respiratory complications will improve 04/02/2024 1905 by Joshua Alycia BRAVO, RN Outcome: Progressing 04/02/2024 1904 by Joshua Alycia BRAVO, RN Outcome: Progressing Goal: Cardiovascular complication will be avoided 04/02/2024 1905 by Joshua Alycia BRAVO, RN Outcome: Progressing 04/02/2024 1904 by Joshua Alycia BRAVO, RN Outcome: Progressing   Problem: Activity: Goal: Risk for activity intolerance will decrease 04/02/2024 1905 by Joshua Alycia BRAVO, RN Outcome: Progressing 04/02/2024 1904 by Joshua Alycia BRAVO, RN Outcome: Progressing   Problem: Nutrition: Goal: Adequate nutrition will be maintained 04/02/2024 1905 by Joshua Alycia BRAVO, RN Outcome: Progressing 04/02/2024 1904 by Joshua Alycia BRAVO, RN Outcome: Progressing   Problem: Coping: Goal: Level of anxiety will decrease 04/02/2024 1905 by Joshua Alycia BRAVO, RN Outcome:  Progressing 04/02/2024 1904 by Joshua Alycia BRAVO, RN Outcome: Progressing   Problem: Elimination: Goal: Will not experience complications related to bowel motility 04/02/2024 1905 by Joshua Alycia BRAVO, RN Outcome: Progressing 04/02/2024 1904 by Joshua Alycia BRAVO, RN Outcome: Progressing Goal: Will not experience complications related to urinary retention 04/02/2024 1905 by Joshua Alycia BRAVO, RN Outcome: Progressing 04/02/2024 1904 by Joshua Alycia BRAVO, RN Outcome: Progressing   Problem: Pain Managment: Goal: General experience of comfort will improve and/or be controlled 04/02/2024 1905 by Joshua Alycia BRAVO, RN Outcome: Progressing 04/02/2024 1904 by Joshua Alycia BRAVO, RN Outcome: Progressing   Problem: Safety: Goal: Ability to remain free from injury will improve 04/02/2024 1905 by Joshua Alycia BRAVO, RN Outcome: Progressing 04/02/2024 1904 by Joshua Alycia BRAVO, RN Outcome: Progressing   Problem: Skin Integrity: Goal: Risk for impaired skin integrity will decrease 04/02/2024 1905 by Joshua Alycia BRAVO, RN Outcome: Progressing 04/02/2024 1904 by Joshua Alycia BRAVO, RN Outcome: Progressing

## 2024-04-02 NOTE — Progress Notes (Signed)
 Progress Note   Patient: [name] FMW:[id number] DOB: 1925/11/02 DOA: 03/26/2024     6 DOS: the patient was seen and examined on 04/02/2024   Brief hospital admission narrative: As per H&P written by Dr. Charlton on 03/26/2024 [name] is a 89 y.o. female with medical history significant for hypertension, hyperlipidemia, CKD stage IV, PAF on Coumadin , symptomatic bradycardia status post PPM, and recent UTI for which she has just completed a course of Augmentin  and now presents with worsening fatigue, generalized weakness, loss of appetite, chills, nausea, and vomiting.   Her daughters are at the bedside and assists with the history.  She has had a poor appetite for more than a week now, and has had fatigue and generalized weakness which has worsened significantly over the past day or so.  She also had vomiting today and chills.  She rarely complains of anything and does her best to avoid the hospital but asked her daughters to call an ambulance for her today which was very unusual.   ED Course: Upon arrival to the ED, patient is found to be afebrile and saturating mid 90s on room air with normal HR and SBP ranging from 80-120.  Labs are most notable for creatinine 2.28, alkaline phosphatase 514, AST 383, ALT 173, total bilirubin 1.8, WBC 19,900, normal lactic acid, and INR 5.1.  CT demonstrates distended gallbladder and increased CBD diameter, now at 15 mm.   Blood and urine cultures were collected in the ED, 1.5 L IVF was administered, the patient was also given acetaminophen , Zofran , Rocephin , and Flagyl .   Subjective:  The patient was seen and examined this morning, accompanied by her daughter at bedside. -The patient was complaining of cough, shortness of breath, daughter noted increased edema lower extremities Otherwise patient had no complaint, hemodynamically stable Satting 94% on 2 L of oxygen  LFTs continue to improve, Tolerating p.o. antibiotics  Per current finding of shortness  of breath, edema, delay discharge to SNF to 04/03/2024    Assessment and plan 1-elevated LFTs/CBD dilatation and concern for cholangitis -Continue to improve LFTs -Monitoring closely  - Right upper quadrant ultrasound with concerns for ongoing retained stone; but that has mainly been clear by MRCP. - So far tolerating diet without problems - Antibiotics will be transitioned to oral route using third-generation cephalosporin and Flagyl . (Plan is for a total of 10 days abx's; currently day 5/10) -per GI recommendations patient has started treatment with ursodiol  -Continue supportive care and current management. - MRCP did not demonstrate the presence of retained stones or large masses; the study was compromised by movement degradation & artifacts.  CBD was visualized to be dilated. - - Bilirubin has normalized and patient's LFTs continue trending down (AST and ALT now within normal limits as well); WBCs also demonstrating continue improvement.  2-acute kidney injury superimposed on chronic kidney disease stage IV - In the setting of continued nephrotoxic agents and prerenal azotemia - Continue to maintain adequate hydration - Starting diuretic Lasix  due to edema,  -Continue to follow renal function trend intermittently. - Continue avoiding the use of contrast and avoid hypotension.  3-paroxysmal atrial fibrillation/supratherapeutic INR/pacemaker implantation - INR is 5.3>> 4.0>> 2.3 >>2.0 >> 1.8>> 2.3, 3.6  - Pharmacy to continue assisting with Coumadin  dosage. - No signs of overt bleeding currently appreciated. -Patient's heart rate has remained stable and will be safe to discontinue telemetry. - Continue the use of diltiazem  and metoprolol  for rate control.  4-hyperlipidemia - Continue holding statin in the  setting of transaminitis - Follow trend and resume when appropriate.  5-generalized weakness/physical deconditioning - Patient seen by physical therapy with recommendation for  skilled nursing facility at discharge - Family in agreement - TOC aware and helping with placement. -Patient medically stable for discharge when SNF bed available and insurance authorization acquired.  6-chronic diastolic heart failure - Stable and compensated - Continue to follow daily weights and strict I's and O's.  7-insomnia - Continue as needed trazodone  has been ordered.  8-anemia of chronic disease - No upper bleeding appreciated - Continue to follow hemoglobin trend intermittently - At the moment no transfusion required.    Physical Exam: Blood pressure (!) 138/59, pulse 77, temperature 98.5 F (36.9 C), temperature source Oral, resp. rate 20, height 5' 6 (1.676 m), weight 80.3 kg, SpO2 94%.      General:  AAO x 3,  cooperative, no distress;   HEENT:  Normocephalic, PERRL, otherwise with in Normal limits   Neuro:  CNII-XII intact. , normal motor and sensation, reflexes intact   Lungs:   Diffuse Rales but > right and left, reduced breath sounds mid to lower lobes, mild crackles, negative for rhonchi No wheezes /   Cardio:    S1/S2, RRR, No murmure, No Rubs or Gallops   Abdomen:  Soft, non-tender, bowel sounds active all four quadrants, no guarding or peritoneal signs.  Muscular  skeletal:  Limited exam -global generalized weaknesses - in bed, able to move all 4 extremities,   2+ pulses,  symmetric, +2 pitting edema  Skin:  Dry, warm to touch, negative for any Rashes,  Wounds: Please see nursing documentation         Latest data Reviewed: Comprehensive metabolic panel: Sodium 133, potassium 3.8, chloride 102, bicarb 21, BUN 24, creatinine 2.17, AST 14, ALT 30, alk phos 198, total bilirubin 1.0 and GFR 20 CBC: WBCs 12.0, hemoglobin 8.3 and platelet count 277K INR: 2.0>>1.8 >>2.3 Magnesium: 1.9    Family Communication: Daughter at bedside.  Disposition: Status is: Inpatient Remains inpatient appropriate because: Continue IV therapy.  Holding diuretics  today   Anticipating discharge to SNF in a.m. 04/03/2024  Time spent: 50 minutes  Author: Adriana DELENA Grams, MD 04/02/2024 12:20 PM  For on call review [email or web address].

## 2024-04-02 NOTE — Progress Notes (Signed)
 PHARMACY - ANTICOAGULATION CONSULT NOTE  Pharmacy Consult for warfarin  Indication: atrial fibrillation  No Known Allergies  Patient Measurements: Height: 5' 6 (167.6 cm) Weight: 80.3 kg (177 lb 0.5 oz) IBW/kg (Calculated) : 59.3 HEPARIN DW (KG): 76  Vital Signs: Temp: 98.5 F (36.9 C) (08/06 0300) Temp Source: Oral (08/06 0300) BP: 143/94 (08/06 0300) Pulse Rate: 75 (08/05 2100)  Labs: Recent Labs    03/31/24 0809 04/01/24 0450 04/01/24 1747 04/02/24 0421  HGB  --  8.3*  --  8.7*  HCT  --  26.2*  --  26.1*  PLT  --  277  --  303  LABPROT 22.2* 26.7*  --  37.7*  INR 1.8* 2.3*  --  3.6*  CREATININE 1.95* 2.17* 2.13*  --     Estimated Creatinine Clearance: 16.5 mL/min (A) (by C-G formula based on SCr of 2.13 mg/dL (H)).   Medical History: Past Medical History:  Diagnosis Date   Allergy    Annual physical exam 04/22/2015   Anxiety    Arthritis    Arthritis    Asthma    Atrial fibrillation (HCC)    Bleeding disorder (HCC)    CHF (congestive heart failure) (HCC)    CKD (chronic kidney disease) stage 4, GFR 15-29 ml/min (HCC) 09/08/2021   COPD (chronic obstructive pulmonary disease) (HCC)    Depression    Gastroesophageal reflux disease    Hearing impairment    Heart disease    Heart murmur    High cholesterol    Hyperlipidemia    Hypertension    Hypothyroidism    Impaired glucose tolerance    Macular degeneration    Oxygen  deficiency    Pancreatitis    Paroxysmal atrial fibrillation (HCC) 2011   Sick sinus syndrome (HCC) 2011   Atrial fibrilation 10/2009; and dual chamber pacemaker in 4/11; normal EF   Sleep apnea    Nocturnal oxygen  therapy   Zoster 2016   Assessment: 89 year old female on warfarin for afib. Warfarin was held on admit due to elevated INR and the need for procedures. Discussed with physician today and will start back warfarin tonight. INR was 5.1 on admit. INR now trending up after boosted dose 8/3 to 2.3. CBC has remained stable  overnight, no bleeding noted.   Patient take 5mg  on Mondays and 2.5mg  all other days.  INR 1.8 > 2.3 > 3.6  Goal of Therapy:  INR 2-3 Monitor platelets by anticoagulation protocol: Yes   Plan:  Hold warfarin x 1 dose Patient will likely need dose reduction from usual home dosing in the future Monitor INR daily and s/s of bleeding.  [name], PharmD Clinical Pharmacist 04/02/2024 8:11 AM

## 2024-04-03 DIAGNOSIS — E785 Hyperlipidemia, unspecified: Secondary | ICD-10-CM | POA: Diagnosis not present

## 2024-04-03 DIAGNOSIS — F32A Depression, unspecified: Secondary | ICD-10-CM | POA: Insufficient documentation

## 2024-04-03 DIAGNOSIS — N184 Chronic kidney disease, stage 4 (severe): Secondary | ICD-10-CM | POA: Diagnosis not present

## 2024-04-03 DIAGNOSIS — I5032 Chronic diastolic (congestive) heart failure: Secondary | ICD-10-CM | POA: Diagnosis not present

## 2024-04-03 DIAGNOSIS — D500 Iron deficiency anemia secondary to blood loss (chronic): Secondary | ICD-10-CM | POA: Diagnosis not present

## 2024-04-03 DIAGNOSIS — N179 Acute kidney failure, unspecified: Secondary | ICD-10-CM | POA: Diagnosis not present

## 2024-04-03 DIAGNOSIS — H3530 Unspecified macular degeneration: Secondary | ICD-10-CM | POA: Insufficient documentation

## 2024-04-03 DIAGNOSIS — Z7901 Long term (current) use of anticoagulants: Secondary | ICD-10-CM | POA: Diagnosis not present

## 2024-04-03 DIAGNOSIS — G4700 Insomnia, unspecified: Secondary | ICD-10-CM | POA: Diagnosis not present

## 2024-04-03 DIAGNOSIS — K219 Gastro-esophageal reflux disease without esophagitis: Secondary | ICD-10-CM | POA: Diagnosis not present

## 2024-04-03 DIAGNOSIS — H9190 Unspecified hearing loss, unspecified ear: Secondary | ICD-10-CM | POA: Diagnosis not present

## 2024-04-03 DIAGNOSIS — Z950 Presence of cardiac pacemaker: Secondary | ICD-10-CM | POA: Diagnosis not present

## 2024-04-03 DIAGNOSIS — Z87440 Personal history of urinary (tract) infections: Secondary | ICD-10-CM | POA: Insufficient documentation

## 2024-04-03 DIAGNOSIS — I959 Hypotension, unspecified: Secondary | ICD-10-CM | POA: Diagnosis not present

## 2024-04-03 DIAGNOSIS — I10 Essential (primary) hypertension: Secondary | ICD-10-CM | POA: Diagnosis not present

## 2024-04-03 DIAGNOSIS — Z7401 Bed confinement status: Secondary | ICD-10-CM | POA: Diagnosis not present

## 2024-04-03 DIAGNOSIS — G4730 Sleep apnea, unspecified: Secondary | ICD-10-CM | POA: Diagnosis not present

## 2024-04-03 DIAGNOSIS — D649 Anemia, unspecified: Secondary | ICD-10-CM | POA: Diagnosis not present

## 2024-04-03 DIAGNOSIS — R531 Weakness: Secondary | ICD-10-CM | POA: Diagnosis not present

## 2024-04-03 DIAGNOSIS — K831 Obstruction of bile duct: Secondary | ICD-10-CM | POA: Diagnosis not present

## 2024-04-03 DIAGNOSIS — J302 Other seasonal allergic rhinitis: Secondary | ICD-10-CM | POA: Diagnosis not present

## 2024-04-03 DIAGNOSIS — I480 Paroxysmal atrial fibrillation: Secondary | ICD-10-CM | POA: Diagnosis not present

## 2024-04-03 DIAGNOSIS — E039 Hypothyroidism, unspecified: Secondary | ICD-10-CM | POA: Diagnosis not present

## 2024-04-03 DIAGNOSIS — M1990 Unspecified osteoarthritis, unspecified site: Secondary | ICD-10-CM | POA: Diagnosis not present

## 2024-04-03 DIAGNOSIS — R7989 Other specified abnormal findings of blood chemistry: Secondary | ICD-10-CM | POA: Diagnosis not present

## 2024-04-03 DIAGNOSIS — K5909 Other constipation: Secondary | ICD-10-CM | POA: Diagnosis not present

## 2024-04-03 DIAGNOSIS — J449 Chronic obstructive pulmonary disease, unspecified: Secondary | ICD-10-CM | POA: Diagnosis not present

## 2024-04-03 LAB — COMPREHENSIVE METABOLIC PANEL WITH GFR
ALT: 21 U/L (ref 0–44)
AST: 22 U/L (ref 15–41)
Albumin: 1.8 g/dL — ABNORMAL LOW (ref 3.5–5.0)
Alkaline Phosphatase: 185 U/L — ABNORMAL HIGH (ref 38–126)
Anion gap: 9 (ref 5–15)
BUN: 32 mg/dL — ABNORMAL HIGH (ref 8–23)
CO2: 20 mmol/L — ABNORMAL LOW (ref 22–32)
Calcium: 8.4 mg/dL — ABNORMAL LOW (ref 8.9–10.3)
Chloride: 104 mmol/L (ref 98–111)
Creatinine, Ser: 2.38 mg/dL — ABNORMAL HIGH (ref 0.44–1.00)
GFR, Estimated: 18 mL/min — ABNORMAL LOW (ref >60)
Glucose, Bld: 100 mg/dL — ABNORMAL HIGH (ref 70–99)
Potassium: 4 mmol/L (ref 3.5–5.1)
Sodium: 133 mmol/L — ABNORMAL LOW (ref 135–145)
Total Bilirubin: 1 mg/dL (ref 0.0–1.2)
Total Protein: 5.7 g/dL — ABNORMAL LOW (ref 6.5–8.1)

## 2024-04-03 LAB — PROTIME-INR
INR: 3 — ABNORMAL HIGH (ref 0.8–1.2)
Prothrombin Time: 32.4 s — ABNORMAL HIGH (ref 11.4–15.2)

## 2024-04-03 MED ORDER — GUAIFENESIN 100 MG/5ML PO LIQD: 10.0000 mL | Freq: Three times a day (TID) | ORAL | 0 refills | Status: DC

## 2024-04-03 MED ORDER — VITAMIN D (ERGOCALCIFEROL) 1.25 MG (50000 UNIT) PO CAPS: 50000.0000 [IU] | ORAL_CAPSULE | ORAL | 5 refills | Status: DC

## 2024-04-03 MED ORDER — CEFUROXIME AXETIL 250 MG PO TABS: 250.0000 mg | ORAL_TABLET | Freq: Two times a day (BID) | ORAL | 0 refills | Status: AC

## 2024-04-03 MED ORDER — ORAL CARE MOUTH RINSE: 15.0000 mL | OROMUCOSAL | 0 refills | Status: DC | PRN

## 2024-04-03 MED ORDER — ALBUMIN HUMAN 25 % IV SOLN
25.0000 g | Freq: Once | INTRAVENOUS | Status: AC
  Administered 2024-04-03: 25 g via INTRAVENOUS
  Filled 2024-04-03: qty 100

## 2024-04-03 MED ORDER — URSODIOL 300 MG PO CAPS: 300.0000 mg | ORAL_CAPSULE | Freq: Three times a day (TID) | ORAL | 2 refills | Status: DC

## 2024-04-03 MED ORDER — WARFARIN SODIUM 1 MG PO TABS: 1.0000 mg | ORAL_TABLET | Freq: Once | ORAL | Status: DC

## 2024-04-03 MED ORDER — WARFARIN SODIUM 5 MG PO TABS: ORAL_TABLET | ORAL | 5 refills | Status: DC

## 2024-04-03 MED ORDER — METRONIDAZOLE 500 MG PO TABS: 500.0000 mg | ORAL_TABLET | Freq: Two times a day (BID) | ORAL | 0 refills | Status: AC

## 2024-04-03 NOTE — Progress Notes (Signed)
 PHARMACY - ANTICOAGULATION CONSULT NOTE  Pharmacy Consult for warfarin  Indication: atrial fibrillation  No Known Allergies  Patient Measurements: Height: 5' 6 (167.6 cm) Weight: 80.3 kg (177 lb 0.5 oz) IBW/kg (Calculated) : 59.3 HEPARIN DW (KG): 76  Vital Signs: Temp: 98.2 F (36.8 C) (08/07 0429) Temp Source: Oral (08/07 0429) BP: 119/73 (08/07 0429) Pulse Rate: 82 (08/07 0429)  Labs: Recent Labs    04/01/24 0450 04/01/24 1747 04/02/24 0421 04/03/24 0420  HGB 8.3*  --  8.7*  --   HCT 26.2*  --  26.1*  --   PLT 277  --  303  --   LABPROT 26.7*  --  37.7* 32.4*  INR 2.3*  --  3.6* 3.0*  CREATININE 2.17* 2.13*  --  2.38*    Estimated Creatinine Clearance: 14.8 mL/min (A) (by C-G formula based on SCr of 2.38 mg/dL (H)).   Medical History: Past Medical History:  Diagnosis Date   Allergy    Annual physical exam 04/22/2015   Anxiety    Arthritis    Arthritis    Asthma    Atrial fibrillation (HCC)    Bleeding disorder (HCC)    CHF (congestive heart failure) (HCC)    CKD (chronic kidney disease) stage 4, GFR 15-29 ml/min (HCC) 09/08/2021   COPD (chronic obstructive pulmonary disease) (HCC)    Depression    Gastroesophageal reflux disease    Hearing impairment    Heart disease    Heart murmur    High cholesterol    Hyperlipidemia    Hypertension    Hypothyroidism    Impaired glucose tolerance    Macular degeneration    Oxygen  deficiency    Pancreatitis    Paroxysmal atrial fibrillation (HCC) 2011   Sick sinus syndrome (HCC) 2011   Atrial fibrilation 10/2009; and dual chamber pacemaker in 4/11; normal EF   Sleep apnea    Nocturnal oxygen  therapy   Zoster 2016   Assessment: 89 year old female on warfarin for afib. Warfarin was held on admit due to elevated INR and the need for procedures. Discussed with physician today and will start back warfarin tonight. INR was 5.1 on admit. INR now trending up after boosted dose 8/3 to 2.3. CBC has remained stable  overnight, no bleeding noted.   Patient take 5mg  on Mondays and 2.5mg  all other days.  INR 1.8 > 2.3 > 3.6>3.0  Patient will likely need home dose reduction on discharge until stable regimen found.  Goal of Therapy:  INR 2-3 Monitor platelets by anticoagulation protocol: Yes   Plan:  Warfarin 1 mg x 1 dose Monitor INR daily and s/s of bleeding.  Elspeth Sour, PharmD Clinical Pharmacist 04/03/2024 8:33 AM

## 2024-04-03 NOTE — Progress Notes (Signed)
 Discharge instructions provided to patient and family and report called to [facility] by Damien Silvan. EMS transported patient to [facility].

## 2024-04-03 NOTE — Plan of Care (Signed)
  Problem: Education: Goal: Knowledge of General Education information will improve Description: Including pain rating scale, medication(s)/side effects and non-pharmacologic comfort measures Outcome: Adequate for Discharge   Problem: Health Behavior/Discharge Planning: Goal: Ability to manage health-related needs will improve Outcome: Adequate for Discharge   Problem: Clinical Measurements: Goal: Ability to maintain clinical measurements within normal limits will improve Outcome: Adequate for Discharge Goal: Will remain free from infection Outcome: Adequate for Discharge Goal: Diagnostic test results will improve Outcome: Adequate for Discharge Goal: Respiratory complications will improve Outcome: Adequate for Discharge Goal: Cardiovascular complication will be avoided Outcome: Adequate for Discharge   Problem: Activity: Goal: Risk for activity intolerance will decrease Outcome: Adequate for Discharge   Problem: Nutrition: Goal: Adequate nutrition will be maintained Outcome: Adequate for Discharge   Problem: Coping: Goal: Level of anxiety will decrease Outcome: Adequate for Discharge   Problem: Elimination: Goal: Will not experience complications related to bowel motility Outcome: Adequate for Discharge Goal: Will not experience complications related to urinary retention Outcome: Adequate for Discharge   Problem: Pain Managment: Goal: General experience of comfort will improve and/or be controlled Outcome: Adequate for Discharge   Problem: Safety: Goal: Ability to remain free from injury will improve Outcome: Adequate for Discharge   Problem: Skin Integrity: Goal: Risk for impaired skin integrity will decrease Outcome: Adequate for Discharge   Problem: Acute Rehab PT Goals(only PT should resolve) Goal: Pt Will Go Supine/Side To Sit Outcome: Adequate for Discharge Goal: Patient Will Transfer Sit To/From Stand Outcome: Adequate for Discharge Goal: Pt Will  Transfer Bed To Chair/Chair To Bed Outcome: Adequate for Discharge Goal: Pt Will Ambulate Outcome: Adequate for Discharge   Problem: Acute Rehab OT Goals (only OT should resolve) Goal: Pt. Will Perform Grooming Outcome: Adequate for Discharge Goal: Pt. Will Perform Upper Body Dressing Outcome: Adequate for Discharge Goal: Pt. Will Perform Lower Body Dressing Outcome: Adequate for Discharge Goal: Pt. Will Transfer To Toilet Outcome: Adequate for Discharge Goal: Pt. Will Perform Toileting-Clothing Manipulation Outcome: Adequate for Discharge Goal: Pt/Caregiver Will Perform Home Exercise Program Outcome: Adequate for Discharge

## 2024-04-03 NOTE — Discharge Summary (Addendum)
 Physician Discharge Summary   Patient: [name] MRN: [id number] DOB: February 13, 1926  Admit date:     03/26/2024  Discharge date: 04/03/24  Discharge Physician: Adriana DELENA Grams   PCP: [name], MD   Recommendations at discharge:   Follow-up with the PCP in 1-2 weeks Follow-up with cardiologist Dr. Alvan in 1-2 weeks Patient will need a follow-up referral to nephrologist in 2-4 weeks CBC CMP in 1 week results to PCP and cardiologist Current medications are subject to change by PCP and cardiologist  Discharge Diagnoses: Principal Problem:   Acute renal failure superimposed on stage 4 chronic kidney disease (HCC) Active Problems:   Hypothyroidism   Hyperlipidemia   Paroxysmal atrial fibrillation (HCC)   Biliary obstruction   Loss of appetite    [name] is a 89 y.o. female with medical history significant for hypertension, hyperlipidemia, CKD stage IV, PAF on Coumadin , symptomatic bradycardia status post PPM, and recent UTI for which she has just completed a course of Augmentin  and now presents with worsening fatigue, generalized weakness, loss of appetite, chills, nausea, and vomiting.   Her daughters are at the bedside and assists with the history.  She has had a poor appetite for more than a week now, and has had fatigue and generalized weakness which has worsened significantly over the past day or so.  She also had vomiting today and chills.  She rarely complains of anything and does her best to avoid the hospital but asked her daughters to call an ambulance for her today which was very unusual.   ED Course: Upon arrival to the ED, patient is found to be afebrile and saturating mid 90s on room air with normal HR and SBP ranging from 80-120.  Labs are most notable for creatinine 2.28, alkaline phosphatase 514, AST 383, ALT 173, total bilirubin 1.8, WBC 19,900, normal lactic acid, and INR 5.1.  CT demonstrates distended gallbladder and increased CBD diameter, now at  15 mm.   Blood and urine cultures were collected in the ED, 1.5 L IVF was administered, the patient was also given acetaminophen , Zofran , Rocephin , and Flagyl .  1-elevated LFTs/CBD dilatation and concern for cholangitis -Continue to improve LFTs    - Right upper quadrant ultrasound with concerns for ongoing retained stone; but that has mainly been clear by MRCP. - So far tolerating diet without problems - Antibiotics will be transitioned to oral route using third-generation cephalosporin and Flagyl . (Plan is for a total of 10 days abx's)  -per GI recommendations patient has started treatment with ursodiol   - MRCP did not demonstrate the presence of retained stones or large masses; the study was compromised by movement degradation & artifacts.  CBD was visualized to be dilated.  - Bilirubin has normalized and patient's LFTs continue trending down (AST and ALT now within normal limits as well); WBCs also demonstrating continue improvement.   2-acute kidney injury superimposed on chronic kidney disease stage IV - In the setting of continued nephrotoxic agents and prerenal azotemia - Continue to maintain adequate hydration - as needed diuretic Lasix  due for SOB and  edema,   -Continue to follow renal function trend intermittently. - Continue avoiding the use of contrast and avoid hypotension.   3-paroxysmal atrial fibrillation/supratherapeutic INR/pacemaker implantation - INR is 5.3>> 4.0>> 2.3 >>2.0 >> 1.8>> 2.3, 3.6. 3.0   - Continue the use of diltiazem  and metoprolol  for rate control.   4-hyperlipidemia - Continue holding statin in the setting of transaminitis - Follow trend and  resume when appropriate.   5-generalized weakness/physical deconditioning - Patient seen by physical therapy with recommendation for skilled nursing facility at discharge - Family in agreement - TOC aware and helping with placement. -Patient medically stable for discharge when SNF bed available and  insurance authorization acquired.   6-chronic diastolic heart failure - Stable and compensated - Continue to follow daily weights and strict I's and O's.   7-insomnia - Continue as needed trazodone  has been ordered.   8-anemia of chronic disease-with severe iron deficiency - No upper bleeding appreciated - Continue to follow hemoglobin trend intermittently - No transfusion required during this admission - Will need to continue iron supplements   Disposition: Skilled nursing facility Diet recommendation:  Regular diet DISCHARGE MEDICATION: Allergies as of 04/03/2024   No Known Allergies      Medication List     PAUSE taking these medications    torsemide  20 MG tablet Wait to take this until: April 07, 2024 Commonly known as: DEMADEX  Take 1 tablet (20 mg total) by mouth every other day.       STOP taking these medications    EYE VITAMINS & MINERALS PO   loratadine  10 MG tablet Commonly known as: CLARITIN    lovastatin  40 MG tablet Commonly known as: MEVACOR    meclizine  12.5 MG tablet Commonly known as: ANTIVERT    nitrofurantoin  (macrocrystal-monohydrate) 100 MG capsule Commonly known as: MACROBID    VITAMIN C PO   VITAMIN D3 PO   zinc gluconate 50 MG tablet       TAKE these medications    acetaminophen  500 MG tablet Commonly known as: TYLENOL  Take 1,000 mg by mouth every 6 (six) hours as needed for moderate pain (pain score 4-6).   busPIRone  7.5 MG tablet Commonly known as: BUSPAR  TAKE (1) TABLET BY MOUTH TWICE DAILY.   cefUROXime  250 MG tablet Commonly known as: CEFTIN  Take 1 tablet (250 mg total) by mouth 2 (two) times daily with a meal for 3 days.   diltiazem  300 MG 24 hr capsule Commonly known as: CARDIZEM  CD TAKE ONE CAPSULE BY MOUTH ONCE DAILY.   donepezil  10 MG tablet Commonly known as: ARICEPT  Take 1 tablet (10 mg total) by mouth at bedtime.   fluticasone  50 MCG/ACT nasal spray Commonly known as: FLONASE  Place 1 spray into both  nostrils daily.   guaiFENesin  100 MG/5ML liquid Commonly known as: ROBITUSSIN Take 10 mLs by mouth every 8 (eight) hours.   levothyroxine  112 MCG tablet Commonly known as: SYNTHROID  Take 1 tablet (112 mcg total) by mouth daily.   metoprolol  tartrate 25 MG tablet Commonly known as: LOPRESSOR  Take 1 tablet (25 mg total) by mouth 2 (two) times daily.   metoprolol  tartrate 100 MG tablet Commonly known as: LOPRESSOR  Take 1 tablet (100 mg total) by mouth 2 (two) times daily.   metroNIDAZOLE  500 MG tablet Commonly known as: FLAGYL  Take 1 tablet (500 mg total) by mouth every 12 (twelve) hours for 3 days.   omeprazole  40 MG capsule Commonly known as: PRILOSEC Take 1 capsule (40 mg total) by mouth daily.   OXYGEN  Inhale 2 L into the lungs at bedtime.   ursodiol  300 MG capsule Commonly known as: ACTIGALL  Take 1 capsule (300 mg total) by mouth 3 (three) times daily.   Vitamin D  (Ergocalciferol ) 1.25 MG (50000 UNIT) Caps capsule Commonly known as: DRISDOL  Take 1 capsule (50,000 Units total) by mouth every 7 (seven) days.   warfarin 5 MG tablet Commonly known as: COUMADIN  Take as directed. If  you are unsure how to take this medication, talk to your nurse or doctor. Original instructions: 5 mg -TAKE 1/2 TABLET TO 1 TABLET BY MOUTH DAILY OR AS DIRECTED BY COUMADIN  CLINIC take 1 tablet on Monday and 1/2 all  other days (INR goal 2-3) What changed: See the new instructions.        Contact information for after-discharge care     Destination     [facility] .   Service: Skilled Nursing Contact information: [address] [place]  [address] [phone]                    Discharge Exam: Fredricka Weights   03/27/24 0225  Weight: 80.3 kg        General:  AAO x 1,  cooperative, no distress;   HEENT:  Normocephalic, PERRL, otherwise with in Normal limits   Neuro:  CNII-XII intact. , normal motor and sensation, reflexes intact    Lungs:   Clear to auscultation BL, Respirations unlabored,  No wheezes / crackles  Cardio:    S1/S2, RRR, No murmure, No Rubs or Gallops   Abdomen:  Soft, non-tender, bowel sounds active all four quadrants, no guarding or peritoneal signs.  Muscular  skeletal:  Limited exam -global generalized weaknesses - in bed, able to move all 4 extremities,   2+ pulses,  symmetric, No pitting edema  Skin:  Dry, warm to touch, negative for any Rashes,  Wounds: Please see nursing documentation         Condition at discharge: fair  The results of significant diagnostics from this hospitalization (including imaging, microbiology, ancillary and laboratory) are listed below for reference.   Imaging Studies: DG CHEST PORT 1 VIEW Result Date: 04/02/2024 CLINICAL DATA:  Shortness of breath, fever. EXAM: PORTABLE CHEST 1 VIEW COMPARISON:  March 26, 2024. FINDINGS: Stable cardiomegaly. Minimal bibasilar scarring or edema is noted. Left-sided pacemaker is unchanged. Bony thorax is unremarkable. IMPRESSION: Minimal bibasilar scarring or edema is noted. Electronically Signed   By: Lynwood Landy Raddle M.D.   On: 04/02/2024 17:31   MR 3D Recon At Scanner Result Date: 03/31/2024 CLINICAL DATA:  Jaundice EXAM: 3-DIMENSIONAL MR IMAGE RENDERING ON ACQUISITION WORKSTATION TECHNIQUE: 3-dimensional MR images were rendered by post-processing of the original MR data on an acquisition workstation. The 3-dimensional MR images were interpreted and findings were reported in the accompanying complete MR report for this study COMPARISON:  03/27/2024 right upper quadrant sonogram and 03/26/2024 unenhanced CT abdomen/pelvis FINDINGS: Please see the MRI abdomen without contrast including MRCP report for details. IMPRESSION: Please see the MRI abdomen without contrast including MRCP report dictated under accession number [id number] CHL for details. Electronically Signed   By: Selinda DELENA Blue M.D.   On: 03/31/2024 09:12   MR ABDOMEN MRCP WO  CONTRAST Result Date: 03/28/2024 CLINICAL DATA:  89 year old female inpatient with jaundice. EXAM: MRI ABDOMEN WITHOUT CONTRAST  (INCLUDING MRCP) TECHNIQUE: Multiplanar multisequence MR imaging of the abdomen was performed. Heavily T2-weighted images of the biliary and pancreatic ducts were obtained, and three-dimensional MRCP images were rendered by post processing. COMPARISON:  03/27/2024 right upper quadrant sonogram and 03/26/2024 unenhanced CT abdomen/pelvis FINDINGS: Scan significantly limited by motion degradation. Lower chest: No acute abnormality at the lung bases. Hepatobiliary: Slightly lobulated liver contour, cannot exclude cirrhosis. No definite hepatic steatosis. No liver mass. Mildly distended gallbladder. No gallbladder wall thickening. No cholelithiasis. No pericholecystic fluid. Mild diffuse central intrahepatic biliary ductal dilatation. Dilated common bile duct with diameter  17 mm, with distal tapering. No convincing evidence of choledocholithiasis, biliary wall thickening or biliary masses on motion degraded MRCP. No discrete ampullary mass. Pancreas: No pancreatic mass or duct dilation. Unable to accurately assess for pancreas divisum given motion degradation. Spleen: Normal size. No mass. Adrenals/Urinary Tract: Normal adrenals. No hydronephrosis. Several small benign appearing bilateral renal cysts, largest 2.3 cm in the posterior interpolar right kidney. Partially visualized diffuse bladder wall thickening with extensive bladder trabeculation and numerous small diffuse bladder diverticula. Bladder is nondistended. Stomach/Bowel: Neck small hiatal hernia. Otherwise normal nondistended stomach. Visualized small and large bowel is normal caliber, with no bowel wall thickening. Partially visualized marked sigmoid diverticulosis. Vascular/Lymphatic: Normal caliber abdominal aorta. No pathologically enlarged lymph nodes in the abdomen. Other: No abdominal ascites or focal fluid collection.  Musculoskeletal: No aggressive appearing focal osseous lesions. IMPRESSION: 1. Scan significantly limited by motion degradation. 2. Mild diffuse central intrahepatic biliary ductal dilatation. Moderately dilated common bile duct with diameter 17 mm, with distal tapering. No convincing evidence of choledocholithiasis, biliary wall thickening or biliary masses on motion degraded MRCP. No discrete ampullary or pancreatic mass. No pancreatic duct dilation. Ampullary stricture or occult ampullary mass not excluded. Consider ERCP for further evaluation. 3. Slightly lobulated liver contour, cannot exclude cirrhosis. No liver mass. 4. Partially visualized diffuse bladder wall thickening with extensive bladder trabeculation and numerous small diffuse bladder diverticula. No hydronephrosis. 5. Small hiatal hernia. 6. Marked sigmoid diverticulosis. Electronically Signed   By: Selinda DELENA Blue M.D.   On: 03/28/2024 11:30   US  Abdomen Limited RUQ (LIVER/GB) Result Date: 03/27/2024 CLINICAL DATA:  Transaminitis EXAM: ULTRASOUND ABDOMEN LIMITED RIGHT UPPER QUADRANT COMPARISON:  Ultrasound abdomen February 11, 2015, CT Renal Stone protocol March 26, 2024 FINDINGS: Gallbladder: Gallbladder is significantly distended measuring up to 13.5 cm in length. No gallstones or wall thickening visualized. No sonographic Murphy sign noted by sonographer. Common bile duct: Diameter: Dilated up to 17 mm. No definite intraluminal stone or lesion identified in visualized CBD. Distal CBD is obscured by bowel gas. Liver: Coarse liver echotexture. No focal lesion identified. Non cirrhotic morphology. Portal vein is patent on color Doppler imaging with normal direction of blood flow towards the liver. Other: None. IMPRESSION: Distended gallbladder and significant dilation of the common bile duct. No cholelithiasis. Distal CBD obstructive cause cannot be excluded. Recommend MRCP with contrast for further assessment. Mild coarse liver echogenicity without  significant fatty steatosis. No focal liver lesion. Correlate with liver function tests. Electronically Signed   By: Megan  Zare M.D.   On: 03/27/2024 13:52   CT Renal Stone Study Result Date: 03/26/2024 CLINICAL DATA:  Abdominal and flank pain. EXAM: CT ABDOMEN AND PELVIS WITHOUT CONTRAST TECHNIQUE: Multidetector CT imaging of the abdomen and pelvis was performed following the standard protocol without IV contrast. RADIATION DOSE REDUCTION: This exam was performed according to the departmental dose-optimization program which includes automated exposure control, adjustment of the mA and/or kV according to patient size and/or use of iterative reconstruction technique. COMPARISON:  CT abdomen and pelvis 05/07/2007. FINDINGS: Lower chest: No acute abnormality. Hepatobiliary: No focal liver abnormality is seen. The gallbladder is distended. No gallstones are identified. Common bile duct is dilated measuring 15 mm, increased from prior. Pancreas: Unremarkable. No pancreatic ductal dilatation or surrounding inflammatory changes. Spleen: Spleen is normal in size. There is a peripheral cyst in the spleen measuring 18 mm. This is new from prior. Adrenals/Urinary Tract: There is no hydronephrosis or urinary tract calculus. There is a right renal cyst measuring  2.2 cm. Adrenal glands are within normal limits. The bladder is decompressed. There is questionable bladder wall thickening. There is mild inflammatory stranding surrounding the bladder. Stomach/Bowel: There is a small hiatal hernia. Stomach is within normal limits. Appendix appears normal. No evidence of bowel wall thickening, distention, or inflammatory changes. There is sigmoid colon diverticulosis. Vascular/Lymphatic: Aortic atherosclerosis. No enlarged abdominal or pelvic lymph nodes. Reproductive: Status post hysterectomy. No adnexal masses. Other: No abdominal wall hernia or abnormality. No abdominopelvic ascites. Musculoskeletal: Severe degenerative changes  affect the spine. There are mild degenerative changes of hips. The bones are diffusely osteopenic IMPRESSION: 1. Distended gallbladder. No gallstones identified. 2. Common bile duct is dilated measuring 15 mm, increased from prior. Correlate clinically for biliary obstruction. 3. Questionable bladder wall thickening with mild inflammatory stranding surrounding the bladder. Correlate clinically for cystitis. 4. Right renal cyst. 5. Small hiatal hernia. 6. Sigmoid colon diverticulosis. 7. Aortic atherosclerosis. Aortic Atherosclerosis (ICD10-I70.0). Electronically Signed   By: Greig Pique M.D.   On: 03/26/2024 23:45   DG Chest 1 View Result Date: 03/26/2024 CLINICAL DATA:  Weakness. EXAM: CHEST  1 VIEW COMPARISON:  02/08/2023. FINDINGS: The heart is enlarged the mediastinal contour stable. There is atherosclerotic calcification of the aorta. Stable coarse interstitial markings are present bilaterally. No consolidation, effusion, or pneumothorax is seen. A dual lead pacemaker is present over the left chest. No acute osseous abnormality. IMPRESSION: Stable chest with no active disease. Electronically Signed   By: Leita Birmingham M.D.   On: 03/26/2024 19:40    Microbiology: Results for orders placed or performed during the hospital encounter of 03/26/24  Urine Culture     Status: Abnormal   Collection Time: 03/26/24  8:22 PM   Specimen: Urine, Catheterized  Result Value Ref Range Status   Specimen Description   Final    URINE, CATHETERIZED Performed at [facility], [address] [place], [place] [address]    Special Requests   Final    NONE Performed at [facility], [address] [place], [place] [address]    Culture (A)  Final    <10,000 COLONIES/mL INSIGNIFICANT GROWTH Performed at [facility], [address], [place], [place] [address]    Report Status 03/28/2024 FINAL  Final  Blood culture (routine x 2)     Status: None   Collection Time: 03/26/24 11:02 PM   Specimen: BLOOD   Result Value Ref Range Status   Specimen Description BLOOD BLOOD LEFT ARM  Final   Special Requests   Final    BOTTLES DRAWN AEROBIC AND ANAEROBIC Blood Culture adequate volume   Culture   Final    NO GROWTH 5 DAYS Performed at [facility], [address] [place], [place] [address]    Report Status 03/31/2024 FINAL  Final  Blood culture (routine x 2)     Status: None   Collection Time: 03/26/24 11:04 PM   Specimen: BLOOD  Result Value Ref Range Status   Specimen Description BLOOD BLOOD LEFT HAND  Final   Special Requests   Final    BOTTLES DRAWN AEROBIC AND ANAEROBIC Blood Culture adequate volume   Culture   Final    NO GROWTH 5 DAYS Performed at [facility], [address], [place], [place] [address]    Report Status 03/31/2024 FINAL  Final   *Note: Due to a large number of results and/or encounters for the requested time period, some results have not been displayed. A complete set of results can be  found in Results Review.    Labs: CBC: Recent Labs  Lab 03/28/24 0431 03/29/24 0415 03/30/24 0340 04/01/24 0450 04/02/24 0421  WBC 11.4* 15.7* 12.4* 12.0* 14.1*  HGB 8.6* 8.3* 8.4* 8.3* 8.7*  HCT 26.6* 25.8* 26.9* 26.2* 26.1*  MCV 94.3 94.9 96.1 94.9 93.5  PLT 286 301 291 277 303   Basic Metabolic Panel: Recent Labs  Lab 03/30/24 0340 03/31/24 0809 04/01/24 0450 04/01/24 1747 04/03/24 0420  NA 136 136 133* 135 133*  K 3.2* 3.6 3.8 3.7 4.0  CL 105 108 102 106 104  CO2 22 19* 21* 19* 20*  GLUCOSE 103* 116* 95 117* 100*  BUN 25* 23 24* 25* 32*  CREATININE 2.05* 1.95* 2.17* 2.13* 2.38*  CALCIUM 8.4* 8.5* 8.6* 8.3* 8.4*   Liver Function Tests: Recent Labs  Lab 03/30/24 0340 03/31/24 0809 04/01/24 0450 04/01/24 1747 04/03/24 0420  AST 28 17 14* 12* 22  ALT 60* 42 30 25 21   ALKPHOS 264* 232* 198* 188* 185*  BILITOT 1.2 1.0 1.0 1.0 1.0  PROT 6.0* 6.2* 5.8* 6.1* 5.7*  ALBUMIN  2.3* 2.4* 2.1* 2.1* 1.8*   CBG: No results for input(s): GLUCAP in the last  168 hours.  Discharge time spent: greater than 30 minutes.  Signed: Adriana DELENA Grams, MD [facility] 04/03/2024

## 2024-04-03 NOTE — Telephone Encounter (Signed)
 Per Epic pt is currently at [facility] in [place].

## 2024-04-03 NOTE — Care Management Important Message (Signed)
 Important Message  Patient Details  Name: [name] MRN: [id number] Date of Birth: [birth year]   Important Message Given:  Yes - Medicare IM     [name] [name] 04/03/2024, 11:30 AM

## 2024-04-03 NOTE — Telephone Encounter (Signed)
Patient still inpatient.

## 2024-04-03 NOTE — TOC Transition Note (Signed)
 Transition of Care [facility]) - Discharge Note   Patient Details  Name: [name] MRN: [id number] Date of Birth: Apr 25, 1926  [facility] CM/SW Contact:  Lucie Lunger, LCSWA Phone Number: 04/03/2024, 11:41 AM  Clinical Narrative:    CSW updated that pt is medically stable for D/C to [facility] SN[facility] today. Insurance shara is approved. CSW spoke with pts granddaughter to provide update on D/C today. D/C clinicals sent to facility via HUB. Med necessity sent to floor for RN. RN provided with room and report numbers. EMS called for transport. TOC signing off.    Final next level of care: Skilled Nursing Facility Barriers to Discharge: Barriers Resolved   Patient Goals and CMS Choice Patient states their goals for this hospitalization and ongoing recovery are:: go to SNF CMS Medicare.gov Compare Post Acute Care list provided to:: Patient Represenative (must comment) Choice offered to / list presented to : Adult Children [place] ownership interest in [facility].provided to:: Adult Children    Discharge Placement                Patient to be transferred to facility by: EMS Name of family member notified: Granddaughter Patient and family notified of of transfer: 04/03/24  Discharge Plan and Services Additional resources added to the After Visit Summary for   In-house Referral: Clinical Social Work Discharge Planning Services: CM Consult Post Acute Care Choice: Skilled Nursing Facility                               Social Drivers of Health (SDOH) Interventions SDOH Screenings   Food Insecurity: No Food Insecurity (03/27/2024)  Housing: Low Risk  (03/27/2024)  Transportation Needs: No Transportation Needs (03/27/2024)  Utilities: Not At Risk (03/27/2024)  Alcohol Screen: Low Risk  (09/04/2023)  Depression (PHQ2-9): Low Risk  (03/19/2024)  Financial Resource Strain: Low Risk  (09/04/2023)  Physical Activity: Inactive (09/04/2023)  Social Connections: Socially  Isolated (03/27/2024)  Stress: No Stress Concern Present (09/04/2023)  Tobacco Use: Low Risk  (03/27/2024)  Health Literacy: Inadequate Health Literacy (09/04/2023)     Readmission Risk Interventions    04/01/2024   11:40 AM 03/29/2024   11:00 AM 03/28/2024    3:19 PM  Readmission Risk Prevention Plan  Transportation Screening Complete Complete Complete  PCP or Specialist Appt within 5-7 Days Complete    Home Care Screening Complete Complete Complete  Medication Review (RN CM) Complete Complete Complete

## 2024-04-04 ENCOUNTER — Telehealth: Payer: Self-pay

## 2024-04-04 DIAGNOSIS — R41841 Cognitive communication deficit: Secondary | ICD-10-CM | POA: Insufficient documentation

## 2024-04-04 DIAGNOSIS — M6281 Muscle weakness (generalized): Secondary | ICD-10-CM | POA: Insufficient documentation

## 2024-04-04 DIAGNOSIS — R2681 Unsteadiness on feet: Secondary | ICD-10-CM | POA: Insufficient documentation

## 2024-04-04 NOTE — Telephone Encounter (Signed)
 Per Charmaine HERO; She can have follow up scheduled by the facility if we contact family and let nurse and/or administrator know

## 2024-04-04 NOTE — Progress Notes (Signed)
 [name] Liaison Note:   We recently received a referral to provide outpatient palliative services for Ms. Arceneaux. She was hospitalized prior to services being started.    Hospital liaisons will follow while she remains in the hospital.   Please call with any hospice or outpatient palliative care related questions.    Eleanor Nail, LPN [facility] Liaison [phone]

## 2024-04-04 NOTE — Telephone Encounter (Signed)
 Copied from CRM [id number]. Topic: General - Other >> Apr 04, 2024  9:11 AM Deaijah H wrote: Reason for CRM: [name] w/ [facility] called in to advise PCP know that they received the palliative care order and will be following the patient for palliative care

## 2024-04-06 ENCOUNTER — Other Ambulatory Visit: Payer: Self-pay | Admitting: Family Medicine

## 2024-04-06 DIAGNOSIS — R531 Weakness: Secondary | ICD-10-CM | POA: Diagnosis not present

## 2024-04-06 DIAGNOSIS — E7849 Other hyperlipidemia: Secondary | ICD-10-CM | POA: Diagnosis not present

## 2024-04-06 DIAGNOSIS — N184 Chronic kidney disease, stage 4 (severe): Secondary | ICD-10-CM | POA: Diagnosis not present

## 2024-04-06 DIAGNOSIS — I480 Paroxysmal atrial fibrillation: Secondary | ICD-10-CM | POA: Diagnosis not present

## 2024-04-06 DIAGNOSIS — E038 Other specified hypothyroidism: Secondary | ICD-10-CM | POA: Diagnosis not present

## 2024-04-06 DIAGNOSIS — I10 Essential (primary) hypertension: Secondary | ICD-10-CM | POA: Diagnosis not present

## 2024-04-07 DIAGNOSIS — M62521 Muscle wasting and atrophy, not elsewhere classified, right upper arm: Secondary | ICD-10-CM | POA: Insufficient documentation

## 2024-04-10 ENCOUNTER — Encounter: Payer: Self-pay | Admitting: Family Medicine

## 2024-04-11 ENCOUNTER — Ambulatory Visit: Admitting: Nurse Practitioner

## 2024-04-11 ENCOUNTER — Other Ambulatory Visit: Payer: Self-pay

## 2024-04-11 DIAGNOSIS — N184 Chronic kidney disease, stage 4 (severe): Secondary | ICD-10-CM

## 2024-04-11 NOTE — Telephone Encounter (Signed)
 Sent urgent referral for [facility] services helping managing pt at home until [facility] can take over

## 2024-04-14 ENCOUNTER — Telehealth: Payer: Self-pay

## 2024-04-14 NOTE — Progress Notes (Signed)
 Complex Care Management Note Care Guide Note  04/14/2024 Name: [name] MRN: [id number] DOB: 12/17/1925   Complex Care Management Outreach Attempts: An unsuccessful telephone outreach was attempted today to offer the patient information about available complex care management services.  Follow Up Plan:  Additional outreach attempts will be made to offer the patient complex care management information and services.   Encounter Outcome:  No Answer  Jeoffrey Buffalo , RMA     [place]  [facility], [facility] Guide  Direct Dial: [phone]  Website: [email or web address]

## 2024-04-16 DIAGNOSIS — R52 Pain, unspecified: Secondary | ICD-10-CM | POA: Insufficient documentation

## 2024-04-17 ENCOUNTER — Other Ambulatory Visit: Payer: Self-pay | Admitting: Cardiology

## 2024-04-17 NOTE — Progress Notes (Signed)
 Complex Care Management Note Care Guide Note  04/17/2024 Name: [name] MRN: [id number] DOB: 1926/04/14   Complex Care Management Outreach Attempts: A second unsuccessful outreach was attempted today to offer the patient with information about available complex care management services.  Follow Up Plan:  Additional outreach attempts will be made to offer the patient complex care management information and services.   Encounter Outcome:  No Answer  Jeoffrey Buffalo , RMA       [facility], [facility] Guide  Direct Dial: [phone]  Website: [email or web address]   ***

## 2024-04-21 ENCOUNTER — Encounter: Payer: Self-pay | Admitting: Nurse Practitioner

## 2024-04-21 ENCOUNTER — Ambulatory Visit: Attending: Nurse Practitioner | Admitting: Nurse Practitioner

## 2024-04-21 VITALS — BP 96/62 | HR 77 | Ht 69.0 in | Wt 155.0 lb

## 2024-04-21 DIAGNOSIS — I480 Paroxysmal atrial fibrillation: Secondary | ICD-10-CM

## 2024-04-21 DIAGNOSIS — R7989 Other specified abnormal findings of blood chemistry: Secondary | ICD-10-CM | POA: Diagnosis not present

## 2024-04-21 DIAGNOSIS — Z950 Presence of cardiac pacemaker: Secondary | ICD-10-CM | POA: Diagnosis not present

## 2024-04-21 DIAGNOSIS — D649 Anemia, unspecified: Secondary | ICD-10-CM | POA: Diagnosis not present

## 2024-04-21 DIAGNOSIS — I959 Hypotension, unspecified: Secondary | ICD-10-CM

## 2024-04-21 DIAGNOSIS — N184 Chronic kidney disease, stage 4 (severe): Secondary | ICD-10-CM

## 2024-04-21 MED ORDER — DILTIAZEM HCL ER COATED BEADS 240 MG PO CP24: 240.0000 mg | ORAL_CAPSULE | Freq: Every day | ORAL | 1 refills | Status: DC

## 2024-04-21 MED ORDER — METOPROLOL TARTRATE 100 MG PO TABS: 100.0000 mg | ORAL_TABLET | Freq: Two times a day (BID) | ORAL | 0 refills | Status: DC

## 2024-04-21 NOTE — LTC Provider Review (Signed)
   OCCUPATIONAL THERAPY TREATMENT NOTE   Patient Name:  [name]      Medical Record Number: [id number]  Date of Birth: 11-11-1925 Location: [facility]   PT Precautions: Falls,   OT Precautions:  ,   SLP Precautions:  ,    Weight Bearing: RUE Weight Bearing: No Restrictions   LUE Weight Bearing: No Restrictions   RLE Weight Bearing: No Restrictions   LLE Weight Bearing: No Restrictions    SUBJECTIVE Pt participated in skilled OT on this date.   OBJECTIVE Today's Treatment    Bed Mobility From 1: Supine Bed Mobility Type 1: To Bed Mobility to 1: Short sit Level of Assistance 1: Standby assistance Bed Mobility Comments 1: using bed enabler        Transfer from: Sit Transfer Type : To Transfer to : Stand Technique : Sit to stand, Stand to sit Transfer Device : with FWW from EOB Transfer Level of Assistance : Minimum assistance Trials/Comments : STS from EOB using FWW with hand placement on handles of FWW required Min A ; Pt unable to complete SPT due to shaking of LE's reporting, I can't move my right leg, I 'm tired. Pt did have doctor's appt earlier today then return back to bed after arriving back to facility.  Transfer from: Bed Transfer Type : To Transfer to : Wheelchair Technique : Stand pivot Transfer Level of Assistance : Dependent Trials/Comments : SPT from EOB over to wc required TD.  Pt reports, They use that lift to get me up, referring to EZ stand. Pt's great granddaughter reports family is getting one for pt once she returns home.       LB Dressing: Maximum assistance Footwear management: Dependent (Pt reports soreness on heels; nursing aware. Educated great granddaughter the importance of floating heels and positioning pt on sides (every 2-3 hrs) for decreased risk of pressure sores.)         Ther-Act    Deficits addressed: Standing Tolerance, Standing Balance, General Activity Tolerance Functional Reach:  Forward, Lateral  Treatment Level: Standing  Other: parallel bars  Other: Pt unable to release grasp on handles parallel bars while standing due to fear of falling.  Therapeutic Tools Used/Treatment Activity : Pt engaged in therapeutic activities to increase standing balance and trunk strength, functional reach, bilateral integration, trunk control, proximal stability and general activity tolerance to improve with ADLs.   Pt completed max standing 30 seconds x3 trials with CGA.          I attest that I have reviewed the above information. Signed: Suzen Kerns, OTA 04/21/2024

## 2024-04-21 NOTE — Progress Notes (Signed)
 Complex Care Management Note Care Guide Note  04/21/2024 Name: [name] MRN: [id number] DOB: Nov 26, 1925   Complex Care Management Outreach Attempts: A third unsuccessful outreach was attempted today to offer the patient with information about available complex care management services.  Follow Up Plan:  No further outreach attempts will be made at this time. We have been unable to contact the patient to offer or enroll patient in complex care management services.  Encounter Outcome:  No Answer  Jeoffrey Buffalo , RMA     [place]  [facility], [facility] Guide  Direct Dial: [phone]  Website: [email or web address]

## 2024-04-21 NOTE — Progress Notes (Unsigned)
 Cardiology Office Note   Date:  04/21/2024 ID:  Onnie, Alatorre September 20, 1925, MRN [id number] PCP: [name], MD  [facility] Providers Cardiologist:  Alvan Carrier, MD Cardiology APP:  Johnson Laymon CHRISTELLA, PA-C  Electrophysiologist:  Danelle Birmingham, MD     History of Present Illness [name] is a 89 y.o. female with a PMH of PAF, symptomatic bradycardia, s/p PPM, HTN, hypothyroidism, HLD, GERD, CKD stage IV, recurrent UTI's, lower extremity edema, who presents today for hospital follow-up.    Last seen by Laymon Johnson, PA-C on March 26, 2024. She reported decreased appetite since being tx for UTI. Did have some lower extremity edema d/t consuming soup.   Hospitalized shortly after due to weakness, loss of appetite, N&V, chills and worsening fatigue. Hospital course noted by elevated LFT's, CBD dilatation and concern for cholangitis, CT demonstrated distended gallbladder and CBD diameter of 15 mm. RUQ ultrasound showed concerns for ongoing retained stone, has mainly been clear by MRCP. She also had AKI on CKD stage IV. Was admitted at [facility] to undergo PT/OT at discharge.   She is here for follow-up with her daughter.  She will be discharged on Wednesday and will be going home.  Daughter does admit to blood pressure dropping at times.  This has been noticed at the SNF.  When speaking with patient today, she says she is overall feeling well. Denies any chest pain, shortness of breath, palpitations, syncope, presyncope, dizziness, orthopnea, PND, swelling or significant weight changes, acute bleeding, or claudication.   ROS: Negative.  See HPI.  Studies Reviewed  EKG:  EKG Interpretation Date/Time:  Monday April 21 2024 11:44:48 EDT Ventricular Rate:  77 PR Interval:    QRS Duration:  168 QT Interval:  442 QTC Calculation: 500 R Axis:   254  Text Interpretation: Ventricular-paced rhythm with frequent Premature ventricular complexes When compared with ECG  of 26-Mar-2024 23:34, PREVIOUS ECG IS PRESENT Confirmed by Miriam Norris [phone]) on 04/21/2024 11:55:14 AM   Echo 06/2020:  1. Left ventricular ejection fraction, by estimation, is 55 to 60%. The  left ventricle has normal function. The left ventricle has no regional  wall motion abnormalities. Left ventricular diastolic parameters are  indeterminate.   2. Right ventricular systolic function is low normal. The right  ventricular size is mildly enlarged. There is normal pulmonary artery  systolic pressure.   3. Left atrial size was mildly dilated.   4. Right atrial size was mild to moderately dilated.   5. The mitral valve is normal in structure. Mild mitral valve  regurgitation. No evidence of mitral stenosis.   6. Tricuspid valve regurgitation is mild to moderate.   7. The aortic valve is tricuspid. There is moderate calcification of the  aortic valve. There is moderate thickening of the aortic valve. Aortic  valve regurgitation is not visualized. No aortic stenosis is present.   Carotid duplex 04/2019: IMPRESSION: 1. Very minimal amount of left-sided atherosclerotic plaque, not resulting in a hemodynamically significant stenosis. 2. Normal sonographic evaluation of the right carotid system. 3. Incidental note made of an apparent cardiac arrhythmia. Further evaluation with ECG monitoring could be performed as indicated.  Physical Exam VS:  BP 96/62   Pulse 77   Ht 5' 9 (1.753 m)   Wt 155 lb (70.3 kg)   SpO2 100%   BMI 22.89 kg/m        Wt Readings from Last 3 Encounters:  04/21/24 155 lb (70.3 kg)  03/27/24  177 lb 0.5 oz (80.3 kg)  03/26/24 160 lb (72.6 kg)    GEN: Well nourished, well developed in no acute distress NECK: No JVD; No carotid bruits CARDIAC: S1/S2, RRR, no murmurs, rubs, gallops RESPIRATORY:  Clear to auscultation without rales, wheezing or rhonchi  ABDOMEN: Soft, non-tender, non-distended EXTREMITIES:  No edema; No deformity   ASSESSMENT AND  PLAN  Hypotension Etiology unclear-possibly medication related.  BPs have been dropping per daughter's report.  BP today 96/62, patient asymptomatic.  Will reduce diltiazem  to 240 mg daily and stop 25 mg twice daily of Lopressor  and continue Lopressor  100 mg twice daily.  And instructed family to hold Lopressor  if systolic BP is less than 100.  No other medication changes at this time. Discussed to monitor BP at home at least 2 hours after medications and sitting for 5-10 minutes.  PAF Denies any tachycardia or palpitations.  Medication adjustments as noted above.  Will continue Coumadin  for stroke prevention.  Will obtain CBC.  Symptomatic bradycardia, s/p PPM HR is well controlled today.  Most recent remote device check showed normal device function.  Follow-up with EP as scheduled.   CKD stage IV Most recent labs stable.  Avoid nephrotoxic agents.  Will repeat BMET. Continue to follow with PCP and Nephrology.  Chronic anemia, elevated platelet count Most recent hemoglobin 8.1.  Most likely due to anemia of chronic disease due to her history of CKD.  Will repeat CBC as noted above. Denies any bleeding issues. Will refer to Hem/Onc for further evaluation.    Dispo: Follow-up with MD/APP in 6 weeks or sooner anything changes.  Signed, Almarie Crate, NP

## 2024-04-21 NOTE — Patient Instructions (Addendum)
 Medication Instructions:  Your physician has recommended you make the following change in your medication:  Please stop Metoprolol  25 Mg twice per day Please Take Metoprolol  100 Mg Twice daily and HOLD if systolic Blood Pressure is less than 100.  Please reduce Diltiazem  to 240 Mg daily   Labwork: Today at [facility]   Testing/Procedures: None   Follow-Up: Your physician recommends that you schedule a follow-up appointment in: 6 weeks   Any Other Special Instructions Will Be Listed Below (If Applicable).  If you need a refill on your cardiac medications before your next appointment, please call your pharmacy.

## 2024-04-21 NOTE — Progress Notes (Signed)
 Complex Care Management Note  Care Guide Note 04/21/2024 Name: CEANNA WAREING MRN: [id number] DOB: August 19, 1926  Sharyne CHRISTELLA Gutting is a 89 y.o. year old female who sees Antonetta, Rollene BRAVO, MD for primary care. I reached out to Sharyne CHRISTELLA Gutting by phone today to offer complex care management services.  Ms. Pickerill was given information about Complex Care Management services today including:   The Complex Care Management services include support from the care team which includes your Nurse Care Manager, Clinical Social Worker, or Pharmacist.  The Complex Care Management team is here to help remove barriers to the health concerns and goals most important to you. Complex Care Management services are voluntary, and the patient may decline or stop services at any time by request to their care team member.   Complex Care Management Consent Status: Patient agreed to services and verbal consent obtained.   Follow up plan:  Telephone appointment with complex care management team member scheduled for:  04/24/2024  Encounter Outcome:  Patient Scheduled  Jeoffrey Buffalo , RMA     [place]  [facility], [facility] Guide  Direct Dial: [phone]  Website: [email or web address]

## 2024-04-22 ENCOUNTER — Encounter: Payer: Self-pay | Admitting: Family Medicine

## 2024-04-22 DIAGNOSIS — I5032 Chronic diastolic (congestive) heart failure: Secondary | ICD-10-CM | POA: Diagnosis not present

## 2024-04-22 DIAGNOSIS — R609 Edema, unspecified: Secondary | ICD-10-CM | POA: Diagnosis not present

## 2024-04-22 DIAGNOSIS — M6281 Muscle weakness (generalized): Secondary | ICD-10-CM | POA: Diagnosis not present

## 2024-04-22 DIAGNOSIS — R2681 Unsteadiness on feet: Secondary | ICD-10-CM | POA: Diagnosis not present

## 2024-04-22 NOTE — Nursing Note (Signed)
 Patient is alert and oriented x3, is easily reoriented to time, is able to make needs known, has no complaint of pain or sob. Patient is to continue therapy as ordered by MD. Patient is in bed resting with call light and hydration within reach, even and unlabored breathing noted.

## 2024-04-23 DIAGNOSIS — E7849 Other hyperlipidemia: Secondary | ICD-10-CM | POA: Diagnosis not present

## 2024-04-23 DIAGNOSIS — I10 Essential (primary) hypertension: Secondary | ICD-10-CM | POA: Diagnosis not present

## 2024-04-23 DIAGNOSIS — N184 Chronic kidney disease, stage 4 (severe): Secondary | ICD-10-CM | POA: Diagnosis not present

## 2024-04-23 DIAGNOSIS — I480 Paroxysmal atrial fibrillation: Secondary | ICD-10-CM | POA: Diagnosis not present

## 2024-04-23 DIAGNOSIS — E038 Other specified hypothyroidism: Secondary | ICD-10-CM | POA: Diagnosis not present

## 2024-04-23 DIAGNOSIS — R531 Weakness: Secondary | ICD-10-CM | POA: Diagnosis not present

## 2024-04-23 NOTE — Discharge Summary (Signed)
 [facility] Discharge Summary        Admit date:    04/03/2024 Discharge date:   04/23/2024 Length of stay:    LOS: 20 days     Discharge Service:   [facility] Discharge Attending Physician: Yancey Reno, MD Discharge to:    To Home with Home Health Condition at Discharge:  good Code Status:    DNR and DNI  Patient Care Team: Antonetta Rollene Norris, MD as PCP - General (Family Medicine)  Summary  Consults       none  Discharge Diagnoses  Principal Problem:   Acute kidney failure, unspecified Active Problems:   Paroxysmal atrial fibrillation       Long term (current) use of anticoagulants   Chronic kidney disease, stage IV (severe)       Hyperlipidemia   Essential hypertension, benign   Generalized anxiety disorder   Arthritis   Loss of appetite   Cardiac pacemaker in situ   COPD (chronic obstructive pulmonary disease)       Depression   Gastroesophageal reflux disease   HOH (hard of hearing)   Hypothyroidism   Macular degeneration   Sleep apnea   Chronic constipation   History of recurrent UTIs   Biliary obstruction (CMS-HCC)   Chronic diastolic heart failure       Seasonal allergies   Insomnia, unspecified   Iron deficiency anemia due to chronic blood loss   Weakness generalized   Age-related cognitive decline   Unsteady gait when walking   Muscle weakness (generalized)   Muscle wasting and atrophy, not elsewhere classified, left upper arm   Muscle wasting and atrophy, not elsewhere classified, right upper arm   Cognitive communication deficit   Vitamin D  deficiency   Pain   [facility]  At [facility] rehab in [place]  patient underwent PT OT in addition to close supervision on her medication on daily basis with excellent response and of her being capable of doing activities of livings in addition to being able to transfer from the bed to wheelchair and from the wheelchair to the commode without no difficulties Also patient was scheduled  to have physical therapy and Occupational Therapy as an outpatient and to follow-up with her primary care doctor within 2 weeks  I spent 30 mins in the discharge of this patient.  Procedures   none  Discharge Medications     Your Medication List     START taking these medications    acetaminophen  500 MG tablet Commonly known as: TYLENOL  Take 2 tablets (1,000 mg total) by mouth every six (6) hours as needed.   busPIRone  7.5 MG tablet Commonly known as: BUSPAR  Take 1 tablet (7.5 mg total) by mouth two (2) times a day.   dilTIAZem  240 MG 24 hr capsule Commonly known as: CARDIZEM  CD Take 1 capsule (240 mg total) by mouth daily. Start taking on: April 24, 2024   donepezil  10 MG tablet Commonly known as: ARICEPT  Take 1 tablet (10 mg total) by mouth nightly.   ergocalciferol -1,250 mcg (50,000 unit) 1,250 mcg (50,000 unit) capsule Commonly known as: DRISDOL  Take 1 capsule (1,250 mcg total) by mouth once a week. Start taking on: April 25, 2024   fluticasone  propionate 50 mcg/actuation nasal spray Commonly known as: FLONASE  2 sprays into each nostril in the morning. Start taking on: April 24, 2024   levothyroxine  112 MCG tablet Commonly known as: SYNTHROID  Take 1 tablet (112 mcg total) by mouth daily. Start taking on: April 24, 2024   melatonin 3 mg Tab Take 1 tablet (3 mg total) by mouth every evening.   metoPROLOL  tartrate 100 MG tablet Commonly known as: Lopressor  Take 1 tablet (100 mg total) by mouth two (2) times a day.   omeprazole  40 MG capsule Commonly known as: PriLOSEC Take 1 capsule (40 mg total) by mouth in the morning. Start taking on: April 24, 2024   torsemide  20 MG tablet Commonly known as: DEMADEX  Take 1 tablet (20 mg total) by mouth every other day. Start taking on: April 25, 2024   ursodiol  300 mg capsule Commonly known as: ACTIGALL  Take 1 capsule (300 mg total) by mouth Three (3) times a day.   warfarin 2.5 MG tablet Commonly known  as: JANTOVEN  2.5mg  daily        Pending Test Results   none   Lab Results   Results for orders placed or performed during the hospital encounter of 04/03/24  PT-INR  Result Value Ref Range   PT 27.3 (H) 9.9 - 12.6 sec   INR 2.50 Undefined  PT-INR  Result Value Ref Range   PT 66.6 (H) 9.9 - 12.6 sec   INR 6.22 (HH) Undefined  CBC  Result Value Ref Range   WBC 10.1 4.0 - 10.5 10*9/L   RBC 2.74 (L) 3.80 - 5.10 10*12/L   HGB 8.1 (L) 11.5 - 15.0 g/dL   HCT 75.0 (L) 65.9 - 55.9 %   MCV 90.9 80.0 - 98.0 fL   MCH 29.6 27.0 - 34.0 pg   MCHC 32.5 32.0 - 36.0 g/dL   RDW 85.8 88.4 - 85.4 %   MPV 8.8 7.4 - 10.4 fL   Platelet 557 (H) 140 - 415 10*9/L  Comprehensive Metabolic Panel  Result Value Ref Range   Sodium 136 135 - 145 mmol/L   Potassium 3.4 (L) 3.5 - 5.0 mmol/L   Chloride 103 98 - 107 mmol/L   CO2 26.9 21.0 - 32.0 mmol/L   Anion Gap 6 3 - 11 mmol/L   BUN 28 (H) 8 - 20 mg/dL   Creatinine 7.83 (H) 9.39 - 1.10 mg/dL   BUN/Creatinine Ratio 13    eGFR CKD-EPI (2021) Female 21 (L) >=60 mL/min/1.76m2   Glucose 80 70 - 179 mg/dL   Calcium 8.5 8.5 - 89.8 mg/dL   Albumin  1.9 (L) 3.5 - 5.0 g/dL   Total Protein 6.0 6.0 - 8.0 g/dL   Total Bilirubin 0.4 0.3 - 1.2 mg/dL   AST 11 (L) 15 - 40 U/L   ALT 13 12 - 78 U/L   Alkaline Phosphatase 123 (H) 46 - 116 U/L  PT-INR  Result Value Ref Range   PT 24.3 (H) 9.9 - 12.6 sec   INR 2.22 Undefined  PT-INR  Result Value Ref Range   PT 26.5 (H) 9.9 - 12.6 sec   INR 2.43 Undefined  PT-INR  Result Value Ref Range   PT 27.0 (H) 9.9 - 12.6 sec   INR 2.48 Undefined  Urinalysis with Microscopy  Result Value Ref Range   Color, UA Colorless    Clarity, UA Cloudy (A) Clear   Specific Gravity, UA 1.007 (L) 1.010 - 1.025   pH, UA 7.0 5.0 - 8.0   Leukocyte Esterase, UA Moderate (A) Negative   Nitrite, UA Negative Negative   Protein, UA Trace (A) Negative   Glucose, UA Negative Negative, Trace   Ketones, UA Negative Negative    Urobilinogen, UA <2.0 mg/dL <7.9 mg/dL  Bilirubin, UA Negative Negative   Blood, UA Large (A) Negative   RBC, UA 14 (H) 0 - 3 /HPF   WBC, UA 27 (H) 0 - 3 /HPF   WBC Clumps None Seen None Seen /HPF   Squam Epithel, UA 0 0 - 10 /HPF   Yeast, UA None Seen None Seen /HPF   Bacteria, UA Occasional (A) None Seen /HPF   Hyphal Yeast None Seen None Seen /HPF  PT-INR  Result Value Ref Range   PT 36.1 (H) 9.9 - 12.6 sec   INR 3.33 Undefined    Imaging   No results found.  Discharge Instructions   Diet Instructions   Heart Healthy     Activity Instructions   Activity with assistance.     Follow Up instructions and Outpatient Referrals    Ambulatory Referral to Home Health     Reason for referral: Home Health Post Rehab   Requested follow up plan: I would resume responsibility.   Disciplines requested:  Nursing Physical Therapy Occupational Therapy     Nursing requested:  Wound Care Comment - PT/INR's next one is 8/27 Other: (please enter in comments)     Wound count: Wound 2   Wound 1 type/staging: DTI   Wound 1 location: R heel   Wound 1 care orders: Skin prep   Wound 1 order frequency: bid   Wound 2 type/staging: DTI   Wound 2 location: L heel   Wound 2 care orders: Skin prep   Wound 2 order frequency: bid   Physical Therapy requested:  Home safety evaluation Ambulation training Transfer training Strengthening exercises Evaluate and treat     Occupational Therapy Requested:  Home safety evaluation Transfer training ADL or IADL training Cognitive training Strengthening exercises Evaluate and treat     Physician to follow patient's care: PCP   Requested start of care date: Routine (within 48 hours)       Yancey DELENA Reno, MD

## 2024-04-24 ENCOUNTER — Other Ambulatory Visit: Payer: Self-pay | Admitting: *Deleted

## 2024-04-24 ENCOUNTER — Telehealth: Payer: Self-pay | Admitting: Family Medicine

## 2024-04-24 ENCOUNTER — Encounter: Payer: Self-pay | Admitting: *Deleted

## 2024-04-24 ENCOUNTER — Ambulatory Visit: Payer: Self-pay

## 2024-04-24 NOTE — Patient Instructions (Addendum)
 Visit Information  Thank you for taking time to visit with me today. Please don't hesitate to contact me if I can be of assistance to you before our next scheduled appointment.  Our next appointment is by telephone on 06/24/24 at 1130 with the pcp RN CM Rosina Forte Please call the care guide team at [phone] if you need to cancel or reschedule your appointment.   Following is a copy of your care plan:   Goals Addressed             This Visit's Progress    get advise from pcp on UTI, Anemia, HTN, heel pain treatmentVBCI RN Care Plan       Problems:  Chronic Disease Management support and education needs related to UTI, Anemia, HTN, heel pain treatment post skilled nursing facility (snf)  Goal: Over the next 30 days the Caregiver Patient will work with RN CM, pcp, Home health to obtain treatment for UTI, anemia, Hypotension, heel pain treatment post snf as evidenced by review of electronic medical record and patient or care team member report    Interventions:   Anemia/Bleeding Interventions:  Assessment of understanding of anemia/bleeding disorder diagnosis  Basic overview and discussion of anemia/bleeding disorder or acute disease state  Medications reviewed  Counseled on importance of regular laboratory monitoring as directed by provider Advised to call provider or 911 if active bleeding or signs and symptoms of active bleeding occur recommended promotion of rest and energy-conserving measures to manage fatigue, such as balancing activity with periods of rest encouraged strategies to prevent falls related to fatigue, weakness and dizziness; encouraged sitting before standing and using an assistive device encouraged optimal oral intake to support fluid balance and nutrition encouraged dietary changes to increase dietary intake of iron, Vitamin B12 and folic acid  as advised/prescribed Screening for signs and symptoms of depression related to chronic disease state Assessed  social determinant of health barriers  Discussed cotting risks for excess green leafy vegetables Outreach to pcp about UTI, Anemia, Hypotension, heel pain treatment plans  Lab Results  Component Value Date   WBC 14.1 (H) 04/02/2024   HGB 8.7 (L) 04/02/2024   HCT 26.1 (L) 04/02/2024   MCV 93.5 04/02/2024   PLT 303 04/02/2024     Hypertension Interventions: Last practice recorded BP readings:  BP Readings from Last 3 Encounters:  04/21/24 96/62  04/03/24 119/73  03/26/24 118/60   Most recent eGFR/CrCl:  Lab Results  Component Value Date   EGFR 28 (L) 11/27/2023    No components found for: CRCL  Reviewed medications with patient and discussed importance of compliance Discussed plans with patient for ongoing care management follow up and provided patient with direct contact information for care management team Advised patient, providing education and rationale, to monitor blood pressure daily and record, calling PCP for findings outside established parameters Provided education on prescribed diet iron rich diet  Discussed UTI prevention- perineal care, incontinence care/pull up changes, hydration, possible urology consult  Patient Self-Care Activities:  Attend all scheduled provider appointments Call pharmacy for medication refills 3-7 days in advance of running out of medications Call provider office for new concerns or questions  Take medications as prescribed   Work with RN CM, Pcp< home health staff on increasing mobility, providing heel pain, anemia, treatment   Plan:  Telephone follow up appointment with care management team member scheduled for:  06/24/24 1130 with rosina forte RN CM  The patient has been provided with contact information for the care  management team and has been advised to call with any health related questions or concerns.           to be able to get up to the bedside commode - [facility]I RN Care Plan          Please call the Suicide and Crisis  Lifeline: 988 call the [place]  National Suicide Prevention Lifeline: 434-537-8084 or TTY: 925-058-9455 TTY (947) 082-8015) to talk to a trained counselor call 1-800-273-TALK (toll free, 24 hour hotline) call the [facility]: (279) [phone] call 911 if you are experiencing a Mental Health or Behavioral Health Crisis or need someone to talk to.  Patient verbalizes understanding of instructions and care plan provided today and agrees to view in MyChart. Active MyChart status and patient understanding of how to access instructions and care plan via MyChart confirmed with patient.     [name] L. Ramonita, RN, BSN, CCM [place]  [facility], [facility] RN Care Manager Direct Dial: [phone]  Fax: [phone]

## 2024-04-24 NOTE — Patient Outreach (Signed)
 Complex Care Management   Visit Note  06/05/2024 updated noted for 04/24/24  Name:  [name] MRN: [id number] DOB: [birth year]  Situation: Referral received for Complex Care Management related to Heart Failure, Atrial Fibrillation, and  daughter needs help managing pt at home, on palliative care which has not started. Needing help with resources with managing pt's care at home I obtained verbal consent from [name], Caregiver.  Visit completed with Caregiver  on the phone   [name] reached out regarding her grandmother, [name], who was discharged from a skilled nursing facility on August 27 th. She expressed concerns about [name]'s Coumadin  check, stating her PTT was 36.1 and INR was 3.33. [name] was unsure if the Coumadin  dose needed to change or if a home health nurse should draw labs.  Home health services will begin soon, and a nurse and physical therapist will assist [name]. The patient can sit up and take a few steps, and the family is purchasing an easy stand for better mobility. Currently, she uses a diaper and aims to be able to get up & use the bedside commode. she does have a lift chair, a blood pressure, a pulse oximetry and a thermometer  [name] raised concerns about a potential UTI since a urinalysis was done, but she was not started on antibiotics pending culture results. She asked the RN case manager to consult with Doctor Antonetta about this. Additionally, she reported that [name] is experiencing heel pain and inquired about numbing spray.  [name]'s family is now responsible for her care. Th daughter who was the caregiver for 3 years is not involved at this time. Other family live ~ 4 hours away. She wants to delay the follow-up visit with the PCP until her mobility improves. [name] also noted her grandmother's fatigue and low blood pressure, leading to a pause in her blood pressure medications to prevent fall risks.  on 04/10/24 to have a rbc 2.74 Hgb 8.1  Hct 24.9  she was encouraged to start on 325 milligrams daily and also to provide some senna as needed to prevent Constipation  [name] also confirms that her grandmother has poor sleep this can be related to possibly her little hemoglobin and blood count   [name] & RN CM discussed advanced directives and [name] will communicate with the family.   Background:   Past Medical History:  Diagnosis Date   Allergy    Annual physical exam 04/22/2015   Anxiety    Arthritis    Arthritis    Asthma    Atrial fibrillation (HCC)    Atrial fibrillation with RVR (HCC) 07/03/2019   Bleeding disorder    CHF (congestive heart failure) (HCC)    CKD (chronic kidney disease) stage 4, GFR 15-29 ml/min (HCC) 09/08/2021   COPD (chronic obstructive pulmonary disease) (HCC)    Depression    Gastroesophageal reflux disease    Hearing impairment    Heart disease    Heart murmur    High cholesterol    Hyperlipidemia    Hypertension    Hypothyroidism    Impaired glucose tolerance    Macular degeneration    Oxygen  deficiency    Pancreatitis    Paroxysmal atrial fibrillation (HCC) 2011   Sick sinus syndrome (HCC) 2011   Atrial fibrilation 10/2009; and dual chamber pacemaker in 4/11; normal EF   Sleep apnea    Nocturnal oxygen  therapy   Zoster 2016   Assessment: Patient Reported Symptoms:  Cognitive Cognitive Status: Alert and  oriented to person, place, and time Cognitive/Intellectual Conditions Management [RPT]: None reported or documented in medical history or problem list   Health Maintenance Behaviors: Annual physical exam, Healthy diet, Sleep adequate, Social activities Healing Pattern: Unsure Health Facilitated by: Prayer/meditation, Rest  Neurological Neurological Review of Symptoms: Weakness    HEENT HEENT Symptoms Reported: No symptoms reported HEENT Management Strategies: Routine screening HEENT Self-Management Outcome: 4 (good)    Cardiovascular Cardiovascular Symptoms  Reported: Fatigue Does patient have uncontrolled Hypertension?: No Cardiovascular Management Strategies: Adequate rest, Coping strategies, Medical device, Medication therapy, Routine screening Cardiovascular Self-Management Outcome: 3 (uncertain)  Respiratory Respiratory Symptoms Reported: Shortness of breath Additional Respiratory Details: oxygen  at night Respiratory Management Strategies: Adequate rest, Routine screening Respiratory Self-Management Outcome: 3 (uncertain)  Endocrine Endocrine Symptoms Reported: Shortness of breath, Weakness or fatigue Is patient diabetic?: No Endocrine Self-Management Outcome: 3 (uncertain)  Gastrointestinal Gastrointestinal Symptoms Reported: Constipation Gastrointestinal Management Strategies: Fluid modification, Medication therapy, Incontinence garment/pad Gastrointestinal Self-Management Outcome: 3 (uncertain)    Genitourinary Genitourinary Symptoms Reported: Frequency, Incontinence, Pain/burning with urination, Other Other Genitourinary Symptoms: hx recurrent uti Genitourinary Management Strategies: Adequate rest, Incontinence garment/pad, Medication therapy Genitourinary Self-Management Outcome: 3 (uncertain)  Integumentary Integumentary Symptoms Reported: Other Other Integumentary Symptoms: heel soreness Skin Management Strategies: Routine screening, Medication therapy Skin Self-Management Outcome: 3 (uncertain)  Musculoskeletal Musculoskelatal Symptoms Reviewed: Difficulty walking, Joint pain, Limited mobility, Unsteady gait, Weakness Musculoskeletal Management Strategies: Adequate rest, Medical device, Medication therapy, Routine screening Musculoskeletal Self-Management Outcome: 3 (uncertain) Falls in the past year?: No Number of falls in past year: 1 or less Was there an injury with Fall?: No Fall Risk Category Calculator: 0 Patient Fall Risk Level: Low Fall Risk Patient at Risk for Falls Due to: Impaired balance/gait, Impaired  mobility Fall risk Follow up: Falls evaluation completed, Falls prevention discussed  Psychosocial Psychosocial Symptoms Reported: Alteration in sleep habits Behavioral Management Strategies: Support system, Coping strategies Behavioral Health Self-Management Outcome: 3 (uncertain) Major Change/Loss/Stressor/Fears (CP): Medical condition, self, Relationship concerns Techniques to Cope with Loss/Stress/Change: Withdraw, Medication, Diversional activities Quality of Family Relationships: helpful, involved, supportive (family with some concerns with previous caregiver) Do you feel physically threatened by others?: No    06/05/2024    PHQ2-9 Depression Screening   Little interest or pleasure in doing things Several days  Feeling down, depressed, or hopeless Several days  PHQ-2 - Total Score 2  Trouble falling or staying asleep, or sleeping too much Several days  Feeling tired or having little energy Several days  Poor appetite or overeating  Several days  Feeling bad about yourself - or that you are a failure or have let yourself or your family down Not at all  Trouble concentrating on things, such as reading the newspaper or watching television Several days  Moving or speaking so slowly that other people could have noticed.  Or the opposite - being so fidgety or restless that you have been moving around a lot more than usual Not at all  Thoughts that you would be better off dead, or hurting yourself in some way Not at all  PHQ2-9 Total Score 6  If you checked off any problems, how difficult have these problems made it for you to do your work, take care of things at home, or get along with other people    Depression Interventions/Treatment      There were no vitals filed for this visit.  Medications Reviewed Today     Reviewed by Ramonita Suzen CROME, RN (Registered Nurse) on 06/05/24  at 1119  Med List Status: <None>   Medication Order Taking? Sig Documenting Provider Last Dose Status  Informant  0.9 %  sodium chloride  infusion [id number]   Opyd, Timothy S, MD  Active   0.9 %  sodium chloride  infusion (Manually program via Guardrails IV Fluids) [id number]   Laurence Locus, DO  Active   acetaminophen  (TYLENOL ) 325 MG tablet [id number] No Take 2 tablets (650 mg total) by mouth every 6 (six) hours as needed for mild pain (pain score 1-3) or fever (or Fever >/= 101). Pearlean Manus, MD Unknown Active Child, Pharmacy Records  acetaminophen  (TYLENOL ) suppository 650 mg [id number]   Charlton Evalene RAMAN, MD  Active   acetaminophen  (TYLENOL ) tablet 650 mg [id number]   Charlton Evalene RAMAN, MD  Active   aluminum-magnesium hydroxide 200-200 MG/5ML suspension [id number] No Take 15 mLs by mouth every 6 (six) hours as needed for indigestion. [provider] 06/04/2024 Evening Active Child, Pharmacy Records  Discontinued 06/09/13 1213 (Completed Course)   busPIRone  (BUSPAR ) 7.5 MG tablet [id number] No Take 1 tablet (7.5 mg total) by mouth 2 (two) times daily. Pearlean Manus, MD 06/04/2024 Evening Active Child, Pharmacy Records  busPIRone  (BUSPAR ) tablet 7.5 mg [id number]   Charlton Evalene RAMAN, MD  Active   diltiazem  (CARDIZEM  CD) 24 hr capsule 240 mg [id number]   Opyd, Timothy S, MD  Active   diltiazem  (CARDIZEM  CD) 240 MG 24 hr capsule [id number] No Take 1 capsule (240 mg total) by mouth daily. Pearlean Manus, MD 06/04/2024 Morning Active Child, Pharmacy Records  donepezil  (ARICEPT ) 10 MG tablet [id number] No Take 1 tablet (10 mg total) by mouth at bedtime. Pearlean Manus, MD 06/04/2024 Bedtime Active Child, Pharmacy Records  donepezil  (ARICEPT ) tablet 10 mg [id number]   Charlton Evalene RAMAN, MD  Active   ferrous sulfate  325 (65 FE) MG tablet [id number] No Take 1 tablet (325 mg total) by mouth every Tuesday, Thursday, Saturday, and Sunday.  Patient taking differently: Take 325 mg by mouth daily.   Pearlean Manus, MD 06/04/2024 Morning Active Child, Pharmacy Records  levothyroxine  (SYNTHROID ) 112 MCG tablet  [id number] No Take 1 tablet (112 mcg total) by mouth daily before breakfast. Pearlean Manus, MD 06/04/2024  6:00 AM Active Child, Pharmacy Records  levothyroxine  (SYNTHROID ) tablet 112 mcg [id number]   Charlton Evalene RAMAN, MD  Active   meropenem (MERREM) 500 mg in sodium chloride  0.9 % 100 mL IVPB [id number]   Opyd, Evalene RAMAN, MD  Active   metoprolol  tartrate (LOPRESSOR ) 25 MG tablet [id number] No Take 1 tablet (25 mg total) by mouth 2 (two) times daily. Hold if SBP < 100 mmhg Pearlean Manus, MD 06/04/2024 Morning Active Child, Pharmacy Records  metoprolol  tartrate (LOPRESSOR ) tablet 25 mg [id number]   Charlton Evalene RAMAN, MD  Active   omeprazole  (PRILOSEC) 40 MG capsule [id number] No Take 1 capsule (40 mg total) by mouth daily. Pearlean Manus, MD 06/04/2024 Morning Active Child, Pharmacy Records  pantoprazole  (PROTONIX ) injection 40 mg [id number]   Laurence Locus, DO  Active   prochlorperazine  (COMPAZINE ) injection 5 mg [id number]   Charlton Evalene RAMAN, MD  Active   ursodiol  (ACTIGALL ) 300 MG capsule [id number] No Take 1 capsule (300 mg total) by mouth 3 (three) times daily. Willette Adriana LABOR, MD 06/04/2024 Evening Active Child, Pharmacy Records  ursodiol  (ACTIGALL ) capsule 300 mg [id number]   Opyd, Evalene RAMAN, MD  Active   Vitamin D , Ergocalciferol , (DRISDOL ) 1.25 MG (50000 UNIT) CAPS capsule [id number] No Take 50,000 Units by mouth once a week. Every Friday  [provider] 05/30/2024 Active Child, Pharmacy Records  warfarin (COUMADIN ) 2.5 MG tablet [id number] No Take 2.5 mg by mouth See admin instructions. Take 1/2 tablet every day except (Wednesday Hold) [provider] 06/03/2024 Active Child, Pharmacy Records  Warfarin - Pharmacist Dosing Inpatient [id number]   Opyd, Timothy S, MD  Active             Recommendation:   PCP Follow-up  Follow Up Plan:   Telephone follow up appointment date/time:  06/24/24 Rosina Bertrum Iha L. Ramonita, RN, BSN, CCM [place]  [facility],  [facility] RN Care Manager Direct Dial: [phone]  Fax: [phone]

## 2024-04-24 NOTE — Telephone Encounter (Signed)
 Called patient and scheduled an video appt tomorrow with Iliana Polanco. Please advise patient granddaughter  concern not to become septic needs. Has horrible order.

## 2024-04-24 NOTE — Telephone Encounter (Signed)
 FYI Only or Action Required?: Action required by provider: Medication Request.  Patient was last seen in primary care on 03/19/2024 by Tobie Suzzane POUR, MD.  Called Nurse Triage reporting Urinary Tract Infection.  Symptoms began several days ago.  Interventions attempted: Nothing.  Symptoms are: stable.  Triage Disposition: Information or Advice Only Call  Patient/caregiver understands and will follow disposition?:      Copied from CRM [id number]. Topic: Clinical - Red Word Triage >> Apr 24, 2024 11:18 AM Ivette P wrote: [facility] that prompted transfer to Nurse Triage: culture is definitely back and fishy order Reason for Disposition  Health information question, no triage required and triager able to answer question  Answer Assessment - Initial Assessment Questions Culture came back and granddaughter wanted to make office aware and ask that medication be sent to [facility] as soon as possible.    1. REASON FOR CALL: What is the main reason for your call? or How can I best help you?     Pts granddaughter calling because culture came back and wants medications sent for UTI   2. SYMPTOMS : Do you have any symptoms?      Fishy odor  Protocols used: Information Only Call - No Triage-A-AH

## 2024-04-24 NOTE — Telephone Encounter (Signed)
 Appt made.

## 2024-04-25 ENCOUNTER — Telehealth (INDEPENDENT_AMBULATORY_CARE_PROVIDER_SITE_OTHER): Admitting: Family Medicine

## 2024-04-25 ENCOUNTER — Telehealth: Payer: Self-pay

## 2024-04-25 DIAGNOSIS — Z515 Encounter for palliative care: Secondary | ICD-10-CM | POA: Diagnosis not present

## 2024-04-25 DIAGNOSIS — R8290 Unspecified abnormal findings in urine: Secondary | ICD-10-CM | POA: Diagnosis not present

## 2024-04-25 MED ORDER — SILVER SULFADIAZINE 1 % EX CREA: 1.0000 | TOPICAL_CREAM | Freq: Every day | CUTANEOUS | 1 refills | Status: DC

## 2024-04-25 MED ORDER — NYSTATIN 100000 UNIT/GM EX CREA: 1.0000 | TOPICAL_CREAM | Freq: Two times a day (BID) | CUTANEOUS | 2 refills | Status: DC

## 2024-04-25 NOTE — Progress Notes (Signed)
 Virtual Visit via Video Note  I connected with [name] on 04/25/24 at  2:20 PM EDT by a video enabled telemedicine application and verified that I am speaking with the correct person using two identifiers.  Patient Location: Home Provider Location: Office/Clinic  I discussed the limitations, risks, security, and privacy concerns of performing an evaluation and management service by video and the availability of in person appointments. I also discussed with the patient that there may be a patient responsible charge related to this service. The patient expressed understanding and agreed to proceed.  Subjective: PCP: [name], MD  No chief complaint on file.  HPI Urinary Tract Infection This is a new problem.She reports urinary frequency, urgency, foul-smelling urine, and vaginal discharge. She has attempted to increase fluid intake, but this has provided no relief. Her past medical history is significant for recurrent UTIs, with no history of kidney stones.  ROS: Per HPI  Current Outpatient Medications:    acetaminophen  (TYLENOL ) 500 MG tablet, Take 1,000 mg by mouth every 6 (six) hours as needed for moderate pain (pain score 4-6)., Disp: , Rfl:    busPIRone  (BUSPAR ) 7.5 MG tablet, TAKE (1) TABLET BY MOUTH TWICE DAILY., Disp: 60 tablet, Rfl: 2   diltiazem  (CARDIZEM  CD) 240 MG 24 hr capsule, Take 1 capsule (240 mg total) by mouth daily., Disp: 90 capsule, Rfl: 1   donepezil  (ARICEPT ) 10 MG tablet, Take 1 tablet (10 mg total) by mouth at bedtime., Disp: 90 tablet, Rfl: 0   fluticasone  (FLONASE ) 50 MCG/ACT nasal spray, Place 1 spray into both nostrils daily., Disp: , Rfl:    guaiFENesin  (ROBITUSSIN) 100 MG/5ML liquid, Take 10 mLs by mouth every 8 (eight) hours., Disp: 120 mL, Rfl: 0   levothyroxine  (SYNTHROID ) 112 MCG tablet, Take 1 tablet (112 mcg total) by mouth daily., Disp: 90 tablet, Rfl: 0   metoprolol  tartrate (LOPRESSOR ) 100 MG tablet, Take 1 tablet (100 mg total)  by mouth 2 (two) times daily. HOLD if systolic Blood Pressure is less than 100., Disp: 180 tablet, Rfl: 0   nystatin  cream (MYCOSTATIN ), Apply 1 Application topically 2 (two) times daily., Disp: 30 g, Rfl: 2   omeprazole  (PRILOSEC) 40 MG capsule, Take 1 capsule (40 mg total) by mouth daily., Disp: 90 capsule, Rfl: 0   OXYGEN -HELIUM IN, Inhale 2 L into the lungs at bedtime., Disp: , Rfl:    silver  sulfADIAZINE  (SILVADENE ) 1 % cream, Apply 1 Application topically daily. Apply a thin layer to the affected wound once daily and cover with a sterile dressing, Disp: 50 g, Rfl: 1   torsemide  (DEMADEX ) 20 MG tablet, Take 1 tablet (20 mg total) by mouth every other day., Disp: 45 tablet, Rfl: 1   ursodiol  (ACTIGALL ) 300 MG capsule, Take 1 capsule (300 mg total) by mouth 3 (three) times daily., Disp: 90 capsule, Rfl: 2   Vitamin D , Ergocalciferol , (DRISDOL ) 1.25 MG (50000 UNIT) CAPS capsule, Take 1 capsule (50,000 Units total) by mouth every 7 (seven) days., Disp: 12 capsule, Rfl: 5   warfarin (COUMADIN ) 5 MG tablet, 5 mg -TAKE 1/2 TABLET TO 1 TABLET BY MOUTH DAILY OR AS DIRECTED BY COUMADIN  CLINIC take 1 tablet on Monday and 1/2 all  other days (INR goal 2-3), Disp: 30 tablet, Rfl: 5  Current Facility-Administered Medications:    miconazole  (MONISTAT  7) 2 % vaginal cream 1 Applicatorful, 1 Applicatorful, Vaginal, QHS, Edsel Norleen GAILS, MD  Observations/Objective: There were no vitals filed for this visit. Physical Exam  Patient is alert and no acute distress noted.   Assessment and Plan: Malodorous urine Assessment & Plan: Patient was prescribed Bactrim  by another provider Advise to start taking medication Urinalysis, urine culture ordered- Awaiting results will follow up. Advise  after using the bathroom, always wipe from front to back to prevent bacteria from spreading. Avoid irritants like caffeine, alcohol, spicy foods, and artificial sweeteners, as they can aggravate your bladder. Wearing loose,  breathable clothing, especially cotton underwear, can help keep the area dry and reduce bacterial growth. If symptoms persist or worsen follow up.   Orders: -     UA/M w/rflx Culture, Routine  Hospice care -     Ambulatory referral to Hospice  Other orders -     Nystatin ; Apply 1 Application topically 2 (two) times daily.  Dispense: 30 g; Refill: 2 -     Silver  sulfADIAZINE ; Apply 1 Application topically daily. Apply a thin layer to the affected wound once daily and cover with a sterile dressing  Dispense: 50 g; Refill: 1    Follow Up Instructions: No follow-ups on file.   I discussed the assessment and treatment plan with the patient. The patient was provided an opportunity to ask questions, and all were answered. The patient agreed with the plan and demonstrated an understanding of the instructions.   The patient was advised to call back or seek an in-person evaluation if the symptoms worsen or if the condition fails to improve as anticipated.  The above assessment and management plan was discussed with the patient. The patient verbalized understanding of and has agreed to the management plan.   Hilario Kidd Wilhelmena Falter, FNP

## 2024-04-25 NOTE — Assessment & Plan Note (Signed)
 Patient was prescribed Bactrim  by another provider Advise to start taking medication Urinalysis, urine culture ordered- Awaiting results will follow up. Advise  after using the bathroom, always wipe from front to back to prevent bacteria from spreading. Avoid irritants like caffeine, alcohol, spicy foods, and artificial sweeteners, as they can aggravate your bladder. Wearing loose, breathable clothing, especially cotton underwear, can help keep the area dry and reduce bacterial growth. If symptoms persist or worsen follow up.

## 2024-04-25 NOTE — Telephone Encounter (Signed)
 Copied from CRM [id number]. Topic: Clinical - Prescription Issue >> Apr 25, 2024  8:08 AM Charlet HERO wrote: Reason for CRM: Patients Grand daughter is calling about the patient having a uti on the 24tht but the dr would not give meds until thursday and she is stating that she was suppose to be sent something until the culture came back, and Dr Antonetta would call in antibiotics but has not got the meds sent in she is stating that someone called and stated that she would have to have a office visit but the patient is bed ridden and cannot get into the office. She is stating that her grandmother needs to have the antibiotics called in asap so she does not have to go thru another weekend with out relief. Please call back the patient [phone]

## 2024-04-25 NOTE — Telephone Encounter (Signed)
Family aware  

## 2024-04-26 DIAGNOSIS — I480 Paroxysmal atrial fibrillation: Secondary | ICD-10-CM | POA: Diagnosis not present

## 2024-04-26 DIAGNOSIS — H3530 Unspecified macular degeneration: Secondary | ICD-10-CM | POA: Diagnosis not present

## 2024-04-26 DIAGNOSIS — G4730 Sleep apnea, unspecified: Secondary | ICD-10-CM | POA: Diagnosis not present

## 2024-04-26 DIAGNOSIS — N184 Chronic kidney disease, stage 4 (severe): Secondary | ICD-10-CM | POA: Diagnosis not present

## 2024-04-26 DIAGNOSIS — E039 Hypothyroidism, unspecified: Secondary | ICD-10-CM | POA: Diagnosis not present

## 2024-04-26 DIAGNOSIS — M6281 Muscle weakness (generalized): Secondary | ICD-10-CM | POA: Diagnosis not present

## 2024-04-26 DIAGNOSIS — J449 Chronic obstructive pulmonary disease, unspecified: Secondary | ICD-10-CM | POA: Diagnosis not present

## 2024-04-26 DIAGNOSIS — B952 Enterococcus as the cause of diseases classified elsewhere: Secondary | ICD-10-CM | POA: Diagnosis not present

## 2024-04-26 DIAGNOSIS — M1990 Unspecified osteoarthritis, unspecified site: Secondary | ICD-10-CM | POA: Diagnosis not present

## 2024-04-26 DIAGNOSIS — N390 Urinary tract infection, site not specified: Secondary | ICD-10-CM | POA: Diagnosis not present

## 2024-04-26 DIAGNOSIS — E871 Hypo-osmolality and hyponatremia: Secondary | ICD-10-CM | POA: Diagnosis not present

## 2024-04-26 DIAGNOSIS — L89626 Pressure-induced deep tissue damage of left heel: Secondary | ICD-10-CM | POA: Diagnosis not present

## 2024-04-26 DIAGNOSIS — I129 Hypertensive chronic kidney disease with stage 1 through stage 4 chronic kidney disease, or unspecified chronic kidney disease: Secondary | ICD-10-CM | POA: Diagnosis not present

## 2024-04-26 DIAGNOSIS — E785 Hyperlipidemia, unspecified: Secondary | ICD-10-CM | POA: Diagnosis not present

## 2024-04-26 DIAGNOSIS — N179 Acute kidney failure, unspecified: Secondary | ICD-10-CM | POA: Diagnosis not present

## 2024-04-26 DIAGNOSIS — D631 Anemia in chronic kidney disease: Secondary | ICD-10-CM | POA: Diagnosis not present

## 2024-04-26 DIAGNOSIS — L89616 Pressure-induced deep tissue damage of right heel: Secondary | ICD-10-CM | POA: Diagnosis not present

## 2024-04-26 DIAGNOSIS — K219 Gastro-esophageal reflux disease without esophagitis: Secondary | ICD-10-CM | POA: Diagnosis not present

## 2024-04-29 ENCOUNTER — Encounter: Payer: Self-pay | Admitting: Cardiology

## 2024-04-29 ENCOUNTER — Ambulatory Visit (INDEPENDENT_AMBULATORY_CARE_PROVIDER_SITE_OTHER): Payer: Medicare Other

## 2024-04-29 ENCOUNTER — Telehealth: Payer: Self-pay | Admitting: Cardiology

## 2024-04-29 ENCOUNTER — Telehealth: Payer: Self-pay

## 2024-04-29 ENCOUNTER — Telehealth: Payer: Self-pay | Admitting: Family Medicine

## 2024-04-29 DIAGNOSIS — J449 Chronic obstructive pulmonary disease, unspecified: Secondary | ICD-10-CM | POA: Diagnosis not present

## 2024-04-29 DIAGNOSIS — I480 Paroxysmal atrial fibrillation: Secondary | ICD-10-CM

## 2024-04-29 DIAGNOSIS — G4730 Sleep apnea, unspecified: Secondary | ICD-10-CM | POA: Diagnosis not present

## 2024-04-29 DIAGNOSIS — N390 Urinary tract infection, site not specified: Secondary | ICD-10-CM | POA: Diagnosis not present

## 2024-04-29 DIAGNOSIS — L89626 Pressure-induced deep tissue damage of left heel: Secondary | ICD-10-CM | POA: Diagnosis not present

## 2024-04-29 DIAGNOSIS — B952 Enterococcus as the cause of diseases classified elsewhere: Secondary | ICD-10-CM | POA: Diagnosis not present

## 2024-04-29 DIAGNOSIS — I129 Hypertensive chronic kidney disease with stage 1 through stage 4 chronic kidney disease, or unspecified chronic kidney disease: Secondary | ICD-10-CM | POA: Diagnosis not present

## 2024-04-29 DIAGNOSIS — M1990 Unspecified osteoarthritis, unspecified site: Secondary | ICD-10-CM | POA: Diagnosis not present

## 2024-04-29 DIAGNOSIS — E785 Hyperlipidemia, unspecified: Secondary | ICD-10-CM | POA: Diagnosis not present

## 2024-04-29 DIAGNOSIS — E039 Hypothyroidism, unspecified: Secondary | ICD-10-CM | POA: Diagnosis not present

## 2024-04-29 DIAGNOSIS — E871 Hypo-osmolality and hyponatremia: Secondary | ICD-10-CM | POA: Diagnosis not present

## 2024-04-29 DIAGNOSIS — K219 Gastro-esophageal reflux disease without esophagitis: Secondary | ICD-10-CM | POA: Diagnosis not present

## 2024-04-29 DIAGNOSIS — N184 Chronic kidney disease, stage 4 (severe): Secondary | ICD-10-CM | POA: Diagnosis not present

## 2024-04-29 DIAGNOSIS — N179 Acute kidney failure, unspecified: Secondary | ICD-10-CM | POA: Diagnosis not present

## 2024-04-29 DIAGNOSIS — D631 Anemia in chronic kidney disease: Secondary | ICD-10-CM | POA: Diagnosis not present

## 2024-04-29 DIAGNOSIS — L89616 Pressure-induced deep tissue damage of right heel: Secondary | ICD-10-CM | POA: Diagnosis not present

## 2024-04-29 DIAGNOSIS — M6281 Muscle weakness (generalized): Secondary | ICD-10-CM | POA: Diagnosis not present

## 2024-04-29 DIAGNOSIS — H3530 Unspecified macular degeneration: Secondary | ICD-10-CM | POA: Diagnosis not present

## 2024-04-29 NOTE — Telephone Encounter (Signed)
**Note De-identified  [place] Obfuscation** Please advise 

## 2024-04-29 NOTE — Telephone Encounter (Signed)
 Spoke with Davene and gave orders for [facility] POC INR testing weekly. Plans to send RN tomorrow.

## 2024-04-29 NOTE — Telephone Encounter (Signed)
 [name] Hurst, pt's granddaughter called for update on Hospice referral information. She is getting added to the DPR this Friday. Please advise   Best contact: [phone]

## 2024-04-29 NOTE — Telephone Encounter (Signed)
 Copied from CRM [id number]. Topic: Clinical - Home Health Verbal Orders >> Apr 29, 2024  8:59 AM Wyona SQUIBB wrote: Caller/Agency: Davene- [facility] Callback Number: [phone] Service Requested: Occupational Therapy, Physical Therapy, and Skilled Nursing Frequency: Nursing - drawing PTINR weekly (unable to Draw the same day to discharge)  Other than nursing - 1 time a week  Any new concerns about the patient? Yes, 2 unstagable injuries - deep tissue injury, one to each heal of the foot.

## 2024-04-29 NOTE — Telephone Encounter (Signed)
 Davene informed that ptinr will have to be approved by cardiologist Dr. Alvan

## 2024-04-29 NOTE — Telephone Encounter (Unsigned)
 Copied from CRM [id number]. Topic: Referral - Question >> Apr 29, 2024  9:33 AM Annabella S wrote: Reason for CRM: Patient is requesting call back regarding referral for hospice patient is not sure if this needed

## 2024-04-29 NOTE — Telephone Encounter (Signed)
 Granddaughter is calling to get a order for the patient to be able to get her coumadin  check at home with nurse [facility] home [facility]. Please advise

## 2024-04-29 NOTE — Telephone Encounter (Signed)
 Pals contact grand daughter  [name], she had sent msg on 8/29 re concern with coumadin  checking, this needs to continue through cardiology and she needs to contact cardiology about this She had a video visit on 8/29 I know the antibiotic was started for UTI For heel pain I recommend a heel protector pad/ cushion can be ordered or  she  can wear thick socks , and raise foot off bed using a pillow/s under lower leg)to reduce pressure on heel, examine regularly to ensure no skin breakdown

## 2024-04-30 ENCOUNTER — Other Ambulatory Visit: Payer: Self-pay | Admitting: Family Medicine

## 2024-04-30 NOTE — Telephone Encounter (Signed)
 Copied from CRM #[id number]. Topic: Clinical - Medication Refill >> Apr 30, 2024  1:20 PM Dominique E wrote: Medication: nystatin  cream (MYCOSTATIN )   Pt has already ran out because the prescription quantity was not enough. Pt needs more.   Has the patient contacted their pharmacy? Yes (Agent: If no, request that the patient contact the pharmacy for the refill. If patient does not wish to contact the pharmacy document the reason why and proceed with request.) (Agent: If yes, when and what did the pharmacy advise?)  This is the patient's preferred pharmacy:  [facility] - [place], [place] - [address] [place] [address] Phone: [phone] Fax: [phone]  Is this the correct pharmacy for this prescription? Yes If no, delete pharmacy and type the correct one.   Has the prescription been filled recently? Yes  Is the patient out of the medication? Yes  Has the patient been seen for an appointment in the last year OR does the patient have an upcoming appointment? Yes  Can we respond through MyChart? Yes  Agent: Please be advised that Rx refills may take up to 3 business days. We ask that you follow-up with your pharmacy.

## 2024-04-30 NOTE — Telephone Encounter (Signed)
 Victorino Dike informed

## 2024-05-01 ENCOUNTER — Telehealth: Payer: Self-pay | Admitting: Cardiology

## 2024-05-01 ENCOUNTER — Other Ambulatory Visit: Payer: Self-pay

## 2024-05-01 ENCOUNTER — Ambulatory Visit (INDEPENDENT_AMBULATORY_CARE_PROVIDER_SITE_OTHER): Admitting: Cardiology

## 2024-05-01 ENCOUNTER — Inpatient Hospital Stay (HOSPITAL_COMMUNITY)
Admission: EM | Admit: 2024-05-01 | Discharge: 2024-05-05 | DRG: 683 | Disposition: A | Discharge status: Home Health | Attending: Family Medicine | Admitting: Family Medicine

## 2024-05-01 DIAGNOSIS — I9589 Other hypotension: Secondary | ICD-10-CM | POA: Diagnosis not present

## 2024-05-01 DIAGNOSIS — Z5181 Encounter for therapeutic drug level monitoring: Secondary | ICD-10-CM

## 2024-05-01 DIAGNOSIS — M1990 Unspecified osteoarthritis, unspecified site: Secondary | ICD-10-CM | POA: Diagnosis not present

## 2024-05-01 DIAGNOSIS — R1110 Vomiting, unspecified: Secondary | ICD-10-CM | POA: Insufficient documentation

## 2024-05-01 DIAGNOSIS — I120 Hypertensive chronic kidney disease with stage 5 chronic kidney disease or end stage renal disease: Secondary | ICD-10-CM | POA: Diagnosis present

## 2024-05-01 DIAGNOSIS — J449 Chronic obstructive pulmonary disease, unspecified: Secondary | ICD-10-CM | POA: Diagnosis not present

## 2024-05-01 DIAGNOSIS — Z1621 Resistance to vancomycin: Secondary | ICD-10-CM | POA: Diagnosis present

## 2024-05-01 DIAGNOSIS — K219 Gastro-esophageal reflux disease without esophagitis: Secondary | ICD-10-CM | POA: Diagnosis present

## 2024-05-01 DIAGNOSIS — D631 Anemia in chronic kidney disease: Secondary | ICD-10-CM | POA: Diagnosis present

## 2024-05-01 DIAGNOSIS — I129 Hypertensive chronic kidney disease with stage 1 through stage 4 chronic kidney disease, or unspecified chronic kidney disease: Secondary | ICD-10-CM | POA: Diagnosis not present

## 2024-05-01 DIAGNOSIS — R531 Weakness: Secondary | ICD-10-CM

## 2024-05-01 DIAGNOSIS — F32A Depression, unspecified: Secondary | ICD-10-CM | POA: Diagnosis present

## 2024-05-01 DIAGNOSIS — B961 Klebsiella pneumoniae [K. pneumoniae] as the cause of diseases classified elsewhere: Secondary | ICD-10-CM | POA: Diagnosis present

## 2024-05-01 DIAGNOSIS — Z7901 Long term (current) use of anticoagulants: Secondary | ICD-10-CM | POA: Diagnosis not present

## 2024-05-01 DIAGNOSIS — N390 Urinary tract infection, site not specified: Secondary | ICD-10-CM | POA: Diagnosis present

## 2024-05-01 DIAGNOSIS — L89309 Pressure ulcer of unspecified buttock, unspecified stage: Secondary | ICD-10-CM | POA: Diagnosis present

## 2024-05-01 DIAGNOSIS — I480 Paroxysmal atrial fibrillation: Secondary | ICD-10-CM | POA: Diagnosis present

## 2024-05-01 DIAGNOSIS — Z79890 Hormone replacement therapy (postmenopausal): Secondary | ICD-10-CM

## 2024-05-01 DIAGNOSIS — B9620 Unspecified Escherichia coli [E. coli] as the cause of diseases classified elsewhere: Secondary | ICD-10-CM | POA: Diagnosis present

## 2024-05-01 DIAGNOSIS — Z79899 Other long term (current) drug therapy: Secondary | ICD-10-CM

## 2024-05-01 DIAGNOSIS — E785 Hyperlipidemia, unspecified: Secondary | ICD-10-CM | POA: Diagnosis not present

## 2024-05-01 DIAGNOSIS — R748 Abnormal levels of other serum enzymes: Secondary | ICD-10-CM | POA: Insufficient documentation

## 2024-05-01 DIAGNOSIS — N184 Chronic kidney disease, stage 4 (severe): Secondary | ICD-10-CM | POA: Diagnosis not present

## 2024-05-01 DIAGNOSIS — G4730 Sleep apnea, unspecified: Secondary | ICD-10-CM | POA: Diagnosis not present

## 2024-05-01 DIAGNOSIS — I10 Essential (primary) hypertension: Secondary | ICD-10-CM | POA: Diagnosis not present

## 2024-05-01 DIAGNOSIS — L89629 Pressure ulcer of left heel, unspecified stage: Secondary | ICD-10-CM | POA: Diagnosis present

## 2024-05-01 DIAGNOSIS — R112 Nausea with vomiting, unspecified: Secondary | ICD-10-CM | POA: Diagnosis present

## 2024-05-01 DIAGNOSIS — Z87440 Personal history of urinary (tract) infections: Secondary | ICD-10-CM

## 2024-05-01 DIAGNOSIS — Z950 Presence of cardiac pacemaker: Secondary | ICD-10-CM

## 2024-05-01 DIAGNOSIS — E871 Hypo-osmolality and hyponatremia: Secondary | ICD-10-CM | POA: Diagnosis present

## 2024-05-01 DIAGNOSIS — Z825 Family history of asthma and other chronic lower respiratory diseases: Secondary | ICD-10-CM

## 2024-05-01 DIAGNOSIS — I5032 Chronic diastolic (congestive) heart failure: Secondary | ICD-10-CM

## 2024-05-01 DIAGNOSIS — R791 Abnormal coagulation profile: Secondary | ICD-10-CM | POA: Diagnosis not present

## 2024-05-01 DIAGNOSIS — J4489 Other specified chronic obstructive pulmonary disease: Secondary | ICD-10-CM | POA: Diagnosis present

## 2024-05-01 DIAGNOSIS — M6281 Muscle weakness (generalized): Secondary | ICD-10-CM | POA: Diagnosis not present

## 2024-05-01 DIAGNOSIS — N3001 Acute cystitis with hematuria: Secondary | ICD-10-CM | POA: Diagnosis not present

## 2024-05-01 DIAGNOSIS — B952 Enterococcus as the cause of diseases classified elsewhere: Secondary | ICD-10-CM | POA: Diagnosis present

## 2024-05-01 DIAGNOSIS — B9629 Other Escherichia coli [E. coli] as the cause of diseases classified elsewhere: Secondary | ICD-10-CM | POA: Diagnosis present

## 2024-05-01 DIAGNOSIS — L89619 Pressure ulcer of right heel, unspecified stage: Secondary | ICD-10-CM | POA: Diagnosis present

## 2024-05-01 DIAGNOSIS — N185 Chronic kidney disease, stage 5: Secondary | ICD-10-CM | POA: Diagnosis present

## 2024-05-01 DIAGNOSIS — I482 Chronic atrial fibrillation, unspecified: Secondary | ICD-10-CM | POA: Diagnosis present

## 2024-05-01 DIAGNOSIS — E039 Hypothyroidism, unspecified: Secondary | ICD-10-CM | POA: Diagnosis present

## 2024-05-01 DIAGNOSIS — N3000 Acute cystitis without hematuria: Secondary | ICD-10-CM | POA: Diagnosis not present

## 2024-05-01 DIAGNOSIS — I959 Hypotension, unspecified: Secondary | ICD-10-CM | POA: Diagnosis present

## 2024-05-01 DIAGNOSIS — Z9842 Cataract extraction status, left eye: Secondary | ICD-10-CM

## 2024-05-01 DIAGNOSIS — H3530 Unspecified macular degeneration: Secondary | ICD-10-CM | POA: Diagnosis not present

## 2024-05-01 DIAGNOSIS — N179 Acute kidney failure, unspecified: Principal | ICD-10-CM | POA: Diagnosis present

## 2024-05-01 DIAGNOSIS — F0390 Unspecified dementia without behavioral disturbance: Secondary | ICD-10-CM | POA: Diagnosis present

## 2024-05-01 DIAGNOSIS — E860 Dehydration: Secondary | ICD-10-CM | POA: Diagnosis not present

## 2024-05-01 DIAGNOSIS — Z66 Do not resuscitate: Secondary | ICD-10-CM | POA: Diagnosis present

## 2024-05-01 DIAGNOSIS — F419 Anxiety disorder, unspecified: Secondary | ICD-10-CM | POA: Diagnosis present

## 2024-05-01 DIAGNOSIS — E782 Mixed hyperlipidemia: Secondary | ICD-10-CM | POA: Diagnosis not present

## 2024-05-01 DIAGNOSIS — H9190 Unspecified hearing loss, unspecified ear: Secondary | ICD-10-CM | POA: Diagnosis present

## 2024-05-01 DIAGNOSIS — Z9841 Cataract extraction status, right eye: Secondary | ICD-10-CM

## 2024-05-01 DIAGNOSIS — I4891 Unspecified atrial fibrillation: Secondary | ICD-10-CM

## 2024-05-01 DIAGNOSIS — L89626 Pressure-induced deep tissue damage of left heel: Secondary | ICD-10-CM | POA: Diagnosis not present

## 2024-05-01 DIAGNOSIS — L89616 Pressure-induced deep tissue damage of right heel: Secondary | ICD-10-CM | POA: Diagnosis not present

## 2024-05-01 LAB — URINALYSIS, ROUTINE W REFLEX MICROSCOPIC
Bilirubin Urine: NEGATIVE
Glucose, UA: NEGATIVE mg/dL
Ketones, ur: NEGATIVE mg/dL
Nitrite: NEGATIVE
Protein, ur: 100 mg/dL — AB
RBC / HPF: >50 RBC/hpf (ref 0–5)
Specific Gravity, Urine: 1.01 (ref 1.005–1.030)
WBC, UA: >50 WBC/hpf (ref 0–5)
pH: 5 (ref 5.0–8.0)

## 2024-05-01 LAB — CBC WITH DIFFERENTIAL/PLATELET
Abs Immature Granulocytes: 0.05 K/uL (ref 0.00–0.07)
Basophils Absolute: 0.1 K/uL (ref 0.0–0.1)
Basophils Relative: 1 %
Eosinophils Absolute: 0.1 K/uL (ref 0.0–0.5)
Eosinophils Relative: 1 %
HCT: 27.1 % — ABNORMAL LOW (ref 36.0–46.0)
Hemoglobin: 8.8 g/dL — ABNORMAL LOW (ref 12.0–15.0)
Immature Granulocytes: 0 %
Lymphocytes Relative: 16 %
Lymphs Abs: 1.9 K/uL (ref 0.7–4.0)
MCH: 30.2 pg (ref 26.0–34.0)
MCHC: 32.5 g/dL (ref 30.0–36.0)
MCV: 93.1 fL (ref 80.0–100.0)
Monocytes Absolute: 0.8 K/uL (ref 0.1–1.0)
Monocytes Relative: 7 %
Neutro Abs: 8.6 K/uL — ABNORMAL HIGH (ref 1.7–7.7)
Neutrophils Relative %: 75 %
Platelets: 413 K/uL — ABNORMAL HIGH (ref 150–400)
RBC: 2.91 MIL/uL — ABNORMAL LOW (ref 3.87–5.11)
RDW: 14.5 % (ref 11.5–15.5)
WBC: 11.4 K/uL — ABNORMAL HIGH (ref 4.0–10.5)
nRBC: 0 % (ref 0.0–0.2)

## 2024-05-01 LAB — COMPREHENSIVE METABOLIC PANEL WITH GFR
ALT: 10 U/L (ref 0–44)
AST: 17 U/L (ref 15–41)
Albumin: 2.6 g/dL — ABNORMAL LOW (ref 3.5–5.0)
Alkaline Phosphatase: 118 U/L (ref 38–126)
Anion gap: 12 (ref 5–15)
BUN: 45 mg/dL — ABNORMAL HIGH (ref 8–23)
CO2: 24 mmol/L (ref 22–32)
Calcium: 9 mg/dL (ref 8.9–10.3)
Chloride: 97 mmol/L — ABNORMAL LOW (ref 98–111)
Creatinine, Ser: 3.26 mg/dL — ABNORMAL HIGH (ref 0.44–1.00)
GFR, Estimated: 12 mL/min — ABNORMAL LOW (ref >60)
Glucose, Bld: 99 mg/dL (ref 70–99)
Potassium: 4.5 mmol/L (ref 3.5–5.1)
Sodium: 133 mmol/L — ABNORMAL LOW (ref 135–145)
Total Bilirubin: 0.6 mg/dL (ref 0.0–1.2)
Total Protein: 6.3 g/dL — ABNORMAL LOW (ref 6.5–8.1)

## 2024-05-01 LAB — CUP PACEART REMOTE DEVICE CHECK
Battery Remaining Longevity: 46 mo
Battery Remaining Percentage: 39 %
Battery Voltage: 2.95 V
Brady Statistic RV Percent Paced: 61 %
Date Time Interrogation Session: 20250902193804
Implantable Lead Connection Status: 753985
Implantable Lead Connection Status: 753985
Implantable Lead Implant Date: 20110422
Implantable Lead Implant Date: 20110422
Implantable Lead Location: 753859
Implantable Lead Location: 753860
Implantable Pulse Generator Implant Date: 20180319
Lead Channel Impedance Value: 410 Ohm
Lead Channel Pacing Threshold Amplitude: 1 V
Lead Channel Pacing Threshold Pulse Width: 0.5 ms
Lead Channel Sensing Intrinsic Amplitude: 7.5 mV
Lead Channel Setting Pacing Amplitude: 2 V
Lead Channel Setting Pacing Pulse Width: 0.5 ms
Lead Channel Setting Sensing Sensitivity: 1 mV
Pulse Gen Model: 2272
Pulse Gen Serial Number: 8010902

## 2024-05-01 LAB — RESP PANEL BY RT-PCR (RSV, FLU A&B, COVID)  RVPGX2
Influenza A by PCR: NEGATIVE
Influenza B by PCR: NEGATIVE
Resp Syncytial Virus by PCR: NEGATIVE
SARS Coronavirus 2 by RT PCR: NEGATIVE

## 2024-05-01 LAB — TROPONIN I (HIGH SENSITIVITY)
Troponin I (High Sensitivity): 8 ng/L (ref <18)
Troponin I (High Sensitivity): 8 ng/L (ref <18)

## 2024-05-01 LAB — CBG MONITORING, ED: Glucose-Capillary: 94 mg/dL (ref 70–99)

## 2024-05-01 LAB — MAGNESIUM: Magnesium: 2.3 mg/dL (ref 1.7–2.4)

## 2024-05-01 LAB — POCT INR: INR: 8 — AB (ref 2.0–3.0)

## 2024-05-01 LAB — LIPASE, BLOOD: Lipase: 131 U/L — ABNORMAL HIGH (ref 11–51)

## 2024-05-01 MED ORDER — SODIUM CHLORIDE 0.9 % IV SOLN
1.0000 g | Freq: Once | INTRAVENOUS | Status: AC
  Administered 2024-05-01: 1 g via INTRAVENOUS
  Filled 2024-05-01: qty 10

## 2024-05-01 MED ORDER — ONDANSETRON HCL 4 MG/2ML IJ SOLN: 4.0000 mg | Freq: Once | INTRAMUSCULAR | Status: DC

## 2024-05-01 MED ORDER — LACTATED RINGERS IV BOLUS
1000.0000 mL | Freq: Once | INTRAVENOUS | Status: AC
  Administered 2024-05-01: 1000 mL via INTRAVENOUS

## 2024-05-01 NOTE — Telephone Encounter (Signed)
 Patient's home health nurse came out today and checked her INR, it was 8.9. Daughter wants to know if there should be a medication change or would like to know what they should do. Please advise.

## 2024-05-01 NOTE — ED Provider Notes (Signed)
 Admit to medicine [place] [facility] AT [facility] Provider Note   CSN: [id number] Arrival date & time: 05/01/24  1821     History  Chief Complaint  Patient presents with   Emesis    [name] is a 89 y.o. female with PMH as listed below who presents with [facility] from home, was just recent discharged from rehab several days ago, pts daughter called EMS due to pt vomiting after taking her medication at 3 pm this afternoon. One episode of emesis. Also called out due to hypotension, with EMS BP was 110 palpated. Pt is A&Ox 4. BP at this time is 98/60, patient reports she feels fine an dis asymptomatic. No N/V, CP, SOB, cough, flu-like sxs, abd pain, urinary sxs. Had BM today but has recently been constipated. Has tolerated PO in the meantime. No f/c, hematemesis, hematochezia/melena. Patient's daughter provides additional history and states that patient vomited approx 15-20 minutes after receiving her medications today, and she checked her BP and it was 85 systolic. Had been taking good PO intake before that but daughter wonders if she's dehydrated.   Past Medical History:  Diagnosis Date   Allergy    Annual physical exam 04/22/2015   Anxiety    Arthritis    Arthritis    Asthma    Atrial fibrillation (HCC)    Bleeding disorder (HCC)    CHF (congestive heart failure) (HCC)    CKD (chronic kidney disease) stage 4, GFR 15-29 ml/min (HCC) 09/08/2021   COPD (chronic obstructive pulmonary disease) (HCC)    Depression    Gastroesophageal reflux disease    Hearing impairment    Heart disease    Heart murmur    High cholesterol    Hyperlipidemia    Hypertension    Hypothyroidism    Impaired glucose tolerance    Macular degeneration    Oxygen  deficiency    Pancreatitis    Paroxysmal atrial fibrillation (HCC) 2011   Sick sinus syndrome (HCC) 2011   Atrial fibrilation 10/2009; and dual chamber pacemaker in 4/11; normal EF   Sleep apnea    Nocturnal oxygen   therapy   Zoster 2016       Home Medications Prior to Admission medications   Medication Sig Start Date End Date Taking? Authorizing Provider  acetaminophen  (TYLENOL ) 500 MG tablet Take 1,000 mg by mouth every 6 (six) hours as needed for moderate pain (pain score 4-6).    [provider]  busPIRone  (BUSPAR ) 7.5 MG tablet TAKE (1) TABLET BY MOUTH TWICE DAILY. 04/07/24   [name], MD  diltiazem  (CARDIZEM  CD) 240 MG 24 hr capsule Take 1 capsule (240 mg total) by mouth daily. 04/21/24   Miriam Norris, NP  donepezil  (ARICEPT ) 10 MG tablet Take 1 tablet (10 mg total) by mouth at bedtime. 02/06/24   [name], MD  fluticasone  (FLONASE ) 50 MCG/ACT nasal spray Place 1 spray into both nostrils daily.    [provider]  guaiFENesin  (ROBITUSSIN) 100 MG/5ML liquid Take 10 mLs by mouth every 8 (eight) hours. 04/03/24   Shahmehdi, Adriana LABOR, MD  levothyroxine  (SYNTHROID ) 112 MCG tablet Take 1 tablet (112 mcg total) by mouth daily. 11/26/23   [name], MD  metoprolol  tartrate (LOPRESSOR ) 100 MG tablet Take 1 tablet (100 mg total) by mouth 2 (two) times daily. HOLD if systolic Blood Pressure is less than 100. 04/21/24   Miriam Norris, NP  nystatin  cream (MYCOSTATIN ) Apply 1 Application topically 2 (two) times  daily. 04/25/24   Del Wilhelmena Lloyd Sola, FNP  omeprazole  (PRILOSEC) 40 MG capsule Take 1 capsule (40 mg total) by mouth daily. 03/20/24   [name], MD  OXYGEN -HELIUM IN Inhale 2 L into the lungs at bedtime.    [provider]  silver  sulfADIAZINE  (SILVADENE ) 1 % cream Apply 1 Application topically daily. Apply a thin layer to the affected wound once daily and cover with a sterile dressing 04/25/24   Del Wilhelmena Lloyd, [place], FNP  torsemide  (DEMADEX ) 20 MG tablet Take 1 tablet (20 mg total) by mouth every other day. 03/26/24   Strader, Laymon HERO, PA-C  ursodiol  (ACTIGALL ) 300 MG capsule Take 1 capsule (300 mg total) by mouth 3 (three) times  daily. 04/03/24 07/02/24  ShahmehdiAdriana LABOR, MD  Vitamin D , Ergocalciferol , (DRISDOL ) 1.25 MG (50000 UNIT) CAPS capsule Take 1 capsule (50,000 Units total) by mouth every 7 (seven) days. 04/03/24   Shahmehdi, Seyed A, MD  warfarin (COUMADIN ) 5 MG tablet 5 mg -TAKE 1/2 TABLET TO 1 TABLET BY MOUTH DAILY OR AS DIRECTED BY COUMADIN  CLINIC take 1 tablet on Monday and 1/2 all  other days (INR goal 2-3) 04/03/24   Shahmehdi, Adriana LABOR, MD  benzonatate  (TESSALON  PERLES) 100 MG capsule Take 1 capsule (100 mg total) by mouth every 6 (six) hours as needed for cough. 02/05/13 11/04/18  [name], MD      Allergies    Patient has no known allergies.    Review of Systems   Review of Systems A 10 point review of systems was performed and is negative unless otherwise reported in HPI.  Physical Exam Updated Vital Signs BP (!) 107/50   Pulse 76   Temp 98 F (36.7 C) (Tympanic)   Resp 14   SpO2 95%  Physical Exam General: Normal appearing elderly female, lying in bed.  HEENT: PERRLA, Sclera anicteric, dry mucous membranes, trachea midline.  Cardiology: RRR, no murmurs/rubs/gallops.  Resp: Normal respiratory rate and effort. CTAB, no wheezes, rhonchi, crackles.  Abd: Soft, non-tender, non-distended. No rebound tenderness or guarding.  GU: Deferred. MSK: No peripheral edema or signs of trauma. Extremities without deformity or TTP. No cyanosis or clubbing. Skin: warm, dry. No rashes or lesions. Back: No CVA tenderness Neuro: A&Ox3-4, CNs II-XII grossly intact. MAEs. Sensation grossly intact.  Psych: Normal mood and affect.   ED Results / Procedures / Treatments   Labs (all labs ordered are listed, but only abnormal results are displayed) Labs Reviewed  CBC WITH DIFFERENTIAL/PLATELET - Abnormal; Notable for the following components:      Result Value   WBC 11.4 (*)    RBC 2.91 (*)    Hemoglobin 8.8 (*)    HCT 27.1 (*)    Platelets 413 (*)    Neutro Abs 8.6 (*)    All other components within  normal limits  COMPREHENSIVE METABOLIC PANEL WITH GFR - Abnormal; Notable for the following components:   Sodium 133 (*)    Chloride 97 (*)    BUN 45 (*)    Creatinine, Ser 3.26 (*)    Total Protein 6.3 (*)    Albumin  2.6 (*)    GFR, Estimated 12 (*)    All other components within normal limits  URINALYSIS, ROUTINE W REFLEX MICROSCOPIC - Abnormal; Notable for the following components:   APPearance TURBID (*)    Hgb urine dipstick LARGE (*)    Protein, ur 100 (*)    Leukocytes,Ua MODERATE (*)    Bacteria,  UA MANY (*)    All other components within normal limits  LIPASE, BLOOD - Abnormal; Notable for the following components:   Lipase 131 (*)    All other components within normal limits  RESP PANEL BY RT-PCR (RSV, FLU A&B, COVID)  RVPGX2  CULTURE, BLOOD (ROUTINE X 2)  CULTURE, BLOOD (ROUTINE X 2)  MAGNESIUM  CBG MONITORING, ED  TROPONIN I (HIGH SENSITIVITY)  TROPONIN I (HIGH SENSITIVITY)    EKG EKG Interpretation Date/Time:  Thursday May 01 2024 19:33:29 EDT Ventricular Rate:  89 PR Interval:    QRS Duration:  146 QT Interval:  432 QTC Calculation: 526 R Axis:   -63  Text Interpretation: Atrial fibrillation RBBB and LAFB Confirmed by Franklyn Gills [phone]) on 05/01/2024 8:37:54 PM  Radiology No results found.   Procedures Procedures    Medications Ordered in ED Medications  lactated ringers  bolus 1,000 mL (1,000 mLs Intravenous Bolus 05/01/24 1935)  cefTRIAXone  (ROCEPHIN ) 1 g in sodium chloride  0.9 % 100 mL IVPB (1 g Intravenous New Bag/Given 05/01/24 2221)    ED Course/ Medical Decision Making/ A&P                          Medical Decision Making Amount and/or Complexity of Data Reviewed Labs: ordered. Decision-making details documented in ED Course.  Risk Decision regarding hospitalization.    This patient presents to the ED for concern of emesis, this involves an extensive number of treatment options, and is a complaint that carries with it a high  risk of complications and morbidity.  I considered the following differential and admission for this acute, potentially life threatening condition.  Patient is overall well-appearing and asymptomatic at this time.  MDM:    Patient with an episode of emesis today with a solitary episode of hypotension and with dry mucous membranes, family concern for dehydration.  Patient is not currently hypotensive here in the department.  She has tolerated p.o. since that time.  She denies any pain anywhere.  Very low concern for any acute surgical abdominal infection or emergency.  She has an AKI on CKD, creatinine 3.26.  Did obtain an EKG and troponin given episode of emesis and elderly female which showed A-fib and no signs of ischemia, troponin negative at 8-8.  Rester panel is negative, glucose within normal limits.  However UA does demonstrate UTI.  Family states that she was recently treated for UTI and does get recurrent UTIs.  Recently treated with Bactrim .  AKI could have been caused by Bactrim  or the infection itself.  She has no abdominal pain or flank pain that would indicate ureterolithiasis, no evidence of urinary obstruction.  She reports that she does not want to be admitted.  Family reports that the last time she was admitted with an AKI they gave a lot of fluid that required a lot of diuresis and caused a 20 pound weight gain.  They are hesitant about that happening again.  I really the patient is likely dehydrated and she was given just 1 L of fluid.  Given her AKI on CKD in the setting of failure of outpatient antibiotic treatment for UTI recommended admission and family and patient are in agreement. Blood pressure has remained stable but in the setting of possible infection must also consider sepsis.  She does not have any leukocytosis or fever that would indicate SIRS or sepsis at this time. Her will be admitted to medicine.  Clinical Course as of  05/01/24 2311  Thu May 01, 2024  1931  Glucose-Capillary: 94 [HN]  2037 Creatinine(!): 3.26 +AKI w/ baseline ~2.3 [HN]  2037 Troponin I (High Sensitivity): 8 neg [HN]  2037 Resp panel by RT-PCR (RSV, Flu A&B, Covid) Anterior Nasal Swab neg [HN]  2206 Urinalysis, Routine w reflex microscopic -Urine, Clean Catch(!) +UTI [HN]    Clinical Course User Index [HN] Franklyn Sid SAILOR, MD    Labs: I Ordered, and personally interpreted labs.  The pertinent results include:  those listed above  Additional history obtained from chart review, family at bedside.    Cardiac Monitoring: The patient was maintained on a cardiac monitor.  I personally viewed and interpreted the cardiac monitored which showed an underlying rhythm of: NSR  Reevaluation: After the interventions noted above, I reevaluated the patient and found that they have :resolved  Social Determinants of Health: Lives at home w/ family  Disposition: Admit to medicine  Co morbidities that complicate the patient evaluation  Past Medical History:  Diagnosis Date   Allergy    Annual physical exam 04/22/2015   Anxiety    Arthritis    Arthritis    Asthma    Atrial fibrillation (HCC)    Bleeding disorder (HCC)    CHF (congestive heart failure) (HCC)    CKD (chronic kidney disease) stage 4, GFR 15-29 ml/min (HCC) 09/08/2021   COPD (chronic obstructive pulmonary disease) (HCC)    Depression    Gastroesophageal reflux disease    Hearing impairment    Heart disease    Heart murmur    High cholesterol    Hyperlipidemia    Hypertension    Hypothyroidism    Impaired glucose tolerance    Macular degeneration    Oxygen  deficiency    Pancreatitis    Paroxysmal atrial fibrillation (HCC) 2011   Sick sinus syndrome (HCC) 2011   Atrial fibrilation 10/2009; and dual chamber pacemaker in 4/11; normal EF   Sleep apnea    Nocturnal oxygen  therapy   Zoster 2016     Medicines Meds ordered this encounter  Medications   DISCONTD: ondansetron  (ZOFRAN ) injection 4 mg    lactated ringers  bolus 1,000 mL   cefTRIAXone  (ROCEPHIN ) 1 g in sodium chloride  0.9 % 100 mL IVPB    Antibiotic Indication::   UTI    I have reviewed the patients home medicines and have made adjustments as needed  Problem List / ED Course: Problem List Items Addressed This Visit       Genitourinary   * (Principal) UTI (urinary tract infection) - Primary   Other Visit Diagnoses       AKI (acute kidney injury) (HCC)         Nausea and vomiting, unspecified vomiting type                       This note was created using dictation software, which may contain spelling or grammatical errors.    Franklyn Sid SAILOR, MD 05/01/24 [phone]

## 2024-05-01 NOTE — ED Triage Notes (Signed)
 Pt arrived via [facility] from home, was just recent discharged from rehab several days ago, pts daughter called EMS due to pt vomiting after taking her medication at 3 pm this afternoon. One episode of emesis. Also called out due to hypotension, with EMS BP was 110 palpated. Pt is A&Ox 4. BP at this time is 98/60

## 2024-05-01 NOTE — Telephone Encounter (Signed)
 See anticoag encounter

## 2024-05-01 NOTE — Patient Instructions (Signed)
 Description   Spoke with Lacinda [facility]. INR >8, no signs of bleeding. Completed antibiotic course.  Will hold 4 days and recheck 05/05/24

## 2024-05-01 NOTE — Progress Notes (Signed)
 INR >8.0, no signs of bleeding.; Please see anticoagulation encounter   Description   Spoke with Lacinda Joseph [facility]. INR >8, no signs of bleeding. Completed antibiotic course.  Will hold 4 days and recheck 05/05/24

## 2024-05-01 NOTE — H&P (Incomplete)
 History and Physical    Patient: [name] FMW:[id number] DOB: [birth year] DOA: 05/01/2024 DOS: the patient was seen and examined on 05/02/2024 PCP: [name], MD  Patient coming from: Home  Chief Complaint:  Chief Complaint  Patient presents with   Emesis   HPI: [name] is a 89 y.o. female with medical history significant of hypertension, hyperlipidemia, CKD stage IV, PAF on Coumadin , symptomatic bradycardia status post PPM who presents to the emergency department from home via EMS due to an episode of vomiting which occurred about 15 to 20 minutes after taking medication this afternoon (around 3 PM).  She also had low blood pressure with SBP of 85, she was thought to be dehydrated by daughter, so, EMS was activated. She was recently admitted from 7/30 through 8/7 due to concern for cholangitis and AKI superimposed on CKD 4.  ED Course:  In the emergency department, BP was 98/68, other vital signs were within normal range.  Workup in the ED showed WBC 11.4, hemoglobin 8.8, hematocrit 27.1, MCV 93.1, platelets 413.  Lipase 131.  BMP shows sodium 133, potassium 3.5, chloride 97, bicarb 24, blood glucose 99, BUN 45, creatinine 3.26 (baseline creatinine 2.0-2.2), albumin  2.6.  Urinalysis was suspicious for UTI, troponin x 2 was flat at 8, magnesium 2.3.  Influenza A, B, SARS coronavirus 2, RSV was negative. She was treated with IV ceftriaxone , IV hydration was provided. [facility] was asked to admit patient  Review of Systems: Review of systems as noted in the HPI. All other systems reviewed and are negative.   Past Medical History:  Diagnosis Date   Allergy    Annual physical exam 04/22/2015   Anxiety    Arthritis    Arthritis    Asthma    Atrial fibrillation (HCC)    Bleeding disorder (HCC)    CHF (congestive heart failure) (HCC)    CKD (chronic kidney disease) stage 4, GFR 15-29 ml/min (HCC) 09/08/2021   COPD (chronic obstructive pulmonary disease) (HCC)    Depression     Gastroesophageal reflux disease    Hearing impairment    Heart disease    Heart murmur    High cholesterol    Hyperlipidemia    Hypertension    Hypothyroidism    Impaired glucose tolerance    Macular degeneration    Oxygen  deficiency    Pancreatitis    Paroxysmal atrial fibrillation (HCC) 2011   Sick sinus syndrome (HCC) 2011   Atrial fibrilation 10/2009; and dual chamber pacemaker in 4/11; normal EF   Sleep apnea    Nocturnal oxygen  therapy   Zoster 2016   Past Surgical History:  Procedure Laterality Date   ABDOMINAL HYSTERECTOMY     CATARACT EXTRACTION     Bilateral   COLONOSCOPY  08/29/2007   Dr. Shaaron   EYE SURGERY     laser    PACEMAKER INSERTION  08/28/2009   dual chamber    PPM GENERATOR CHANGEOUT N/A 11/13/2016   Procedure: PPM Generator Changeout;  Surgeon: Waddell Danelle ORN, MD;  Location: [facility];  Service: Cardiovascular;  Laterality: N/A;   SALPINGOOPHORECTOMY  08/28/1968   right    Social History:  reports that she has never smoked. She has never been exposed to tobacco smoke. She has never used smokeless tobacco. She reports that she does not drink alcohol and does not use drugs.   No Known Allergies  Family History  Problem Relation Age of Onset   Cancer Mother  gential   Cancer Father        throat    Pneumonia Sister    Emphysema Sister    Mitral valve prolapse Sister    Heart disease Other    Arthritis Other    Lung disease Other    Asthma Other    Melanoma Son    Anxiety disorder Daughter    Arthritis Daughter    Asthma Daughter    COPD Daughter    Depression Daughter    Kidney disease Daughter    Obesity Daughter    Anxiety disorder Daughter    Anxiety disorder Daughter    Arthritis Daughter      Prior to Admission medications   Medication Sig Start Date End Date Taking? Authorizing Provider  acetaminophen  (TYLENOL ) 500 MG tablet Take 1,000 mg by mouth every 6 (six) hours as needed for moderate pain (pain  score 4-6).    [provider]  busPIRone  (BUSPAR ) 7.5 MG tablet TAKE (1) TABLET BY MOUTH TWICE DAILY. 04/07/24   [name], MD  diltiazem  (CARDIZEM  CD) 240 MG 24 hr capsule Take 1 capsule (240 mg total) by mouth daily. 04/21/24   Miriam Norris, NP  donepezil  (ARICEPT ) 10 MG tablet Take 1 tablet (10 mg total) by mouth at bedtime. 02/06/24   [name], MD  fluticasone  (FLONASE ) 50 MCG/ACT nasal spray Place 1 spray into both nostrils daily.    [provider]  guaiFENesin  (ROBITUSSIN) 100 MG/5ML liquid Take 10 mLs by mouth every 8 (eight) hours. 04/03/24   Shahmehdi, Adriana LABOR, MD  levothyroxine  (SYNTHROID ) 112 MCG tablet Take 1 tablet (112 mcg total) by mouth daily. 11/26/23   [name], MD  metoprolol  tartrate (LOPRESSOR ) 100 MG tablet Take 1 tablet (100 mg total) by mouth 2 (two) times daily. HOLD if systolic Blood Pressure is less than 100. 04/21/24   Miriam Norris, NP  nystatin  cream (MYCOSTATIN ) Apply 1 Application topically 2 (two) times daily. 04/25/24   Del Wilhelmena Lloyd Sola, FNP  omeprazole  (PRILOSEC) 40 MG capsule Take 1 capsule (40 mg total) by mouth daily. 03/20/24   [name], MD  OXYGEN -HELIUM IN Inhale 2 L into the lungs at bedtime.    [provider]  silver  sulfADIAZINE  (SILVADENE ) 1 % cream Apply 1 Application topically daily. Apply a thin layer to the affected wound once daily and cover with a sterile dressing 04/25/24   Del Wilhelmena Lloyd, Fate, FNP  torsemide  (DEMADEX ) 20 MG tablet Take 1 tablet (20 mg total) by mouth every other day. 03/26/24   Strader, Laymon HERO, PA-C  ursodiol  (ACTIGALL ) 300 MG capsule Take 1 capsule (300 mg total) by mouth 3 (three) times daily. 04/03/24 07/02/24  ShahmehdiAdriana LABOR, MD  Vitamin D , Ergocalciferol , (DRISDOL ) 1.25 MG (50000 UNIT) CAPS capsule Take 1 capsule (50,000 Units total) by mouth every 7 (seven) days. 04/03/24   Shahmehdi, Seyed A, MD  warfarin (COUMADIN ) 5 MG tablet 5 mg -TAKE 1/2  TABLET TO 1 TABLET BY MOUTH DAILY OR AS DIRECTED BY COUMADIN  CLINIC take 1 tablet on Monday and 1/2 all  other days (INR goal 2-3) 04/03/24   Shahmehdi, Adriana LABOR, MD  benzonatate  (TESSALON  PERLES) 100 MG capsule Take 1 capsule (100 mg total) by mouth every 6 (six) hours as needed for cough. 02/05/13 11/04/18  [name], MD    Physical Exam: BP 120/69 (BP Location: Left Arm)   Pulse 80   Temp (!) 97.5 F (36.4 C) (Oral)  Resp 16   Wt 73.2 kg   SpO2 94%   BMI 23.83 kg/m   General: 89 y.o. year-old female well developed well nourished in no acute distress.  Alert and oriented x3. HEENT: NCAT, EOMI, dry mucous membrane Neck: Supple, trachea medial Cardiovascular: Regular rate and rhythm with no rubs or gallops.  No thyromegaly or JVD noted.  No lower extremity edema. 2/4 pulses in all 4 extremities. Respiratory: Clear to auscultation with no wheezes or rales. Good inspiratory effort. Abdomen: Soft, nontender nondistended with normal bowel sounds x4 quadrants. Muskuloskeletal: No cyanosis, clubbing or edema noted bilaterally Neuro: CN II-XII intact, strength 5/5 x 4, sensation, reflexes intact Skin: Bilateral heel pressure sites.  Psychiatry: Mood is appropriate for condition and setting          Labs on Admission:  Basic Metabolic Panel: Recent Labs  Lab 05/01/24 1917  NA 133*  K 4.5  CL 97*  CO2 24  GLUCOSE 99  BUN 45*  CREATININE 3.26*  CALCIUM 9.0  MG 2.3   Liver Function Tests: Recent Labs  Lab 05/01/24 1917  AST 17  ALT 10  ALKPHOS 118  BILITOT 0.6  PROT 6.3*  ALBUMIN  2.6*   Recent Labs  Lab 05/01/24 1917  LIPASE 131*   No results for input(s): AMMONIA in the last 168 hours. CBC: Recent Labs  Lab 05/01/24 1917  WBC 11.4*  NEUTROABS 8.6*  HGB 8.8*  HCT 27.1*  MCV 93.1  PLT 413*   Cardiac Enzymes: No results for input(s): CKTOTAL, CKMB, CKMBINDEX, TROPONINI in the last 168 hours.  BNP (last 3 results) No results for input(s):  BNP in the last 8760 hours.  ProBNP (last 3 results) No results for input(s): PROBNP in the last 8760 hours.  CBG: Recent Labs  Lab 05/01/24 1915  GLUCAP 94    Radiological Exams on Admission: No results found.  EKG: I independently viewed the EKG done and my findings are as followed: Atrial fibrillation with rate controlled and RBBB and LAFB with QTc of 526 ms  Assessment/Plan Present on Admission:  UTI (urinary tract infection)  Acute kidney injury superimposed on stage 5 chronic kidney disease, not on chronic dialysis (HCC)  Hypotension  Mixed hyperlipidemia  Acquired hypothyroidism  GERD (gastroesophageal reflux disease)  Principal Problem:   UTI (urinary tract infection) Active Problems:   Acquired hypothyroidism   Mixed hyperlipidemia   GERD (gastroesophageal reflux disease)   Generalized weakness   Hypotension   Acute kidney injury superimposed on stage 5 chronic kidney disease, not on chronic dialysis (HCC)   Vomiting   Elevated lipase   Atrial fibrillation, chronic (HCC)   Supratherapeutic INR  Presumed UTI POA Patient was recently treated with Bactrim  per daughter at bedside She was started on IV ceftriaxone , we shall continue with same at this time Urine and blood culture pending  Acute kidney injury superimposed on CKD stage V Dehydration Creatinine 3.26 (baseline creatinine 2.0-2.2) Continue gentle hydration Renally adjust medications, avoid nephrotoxic agents/dehydration/hypotension Nephrology will be consulted  Supratherapeutic INR INR 8; warfarin will be held at this time  Prolonged QT interval QTc 526 ms Avoid QT prolonging drugs Magnesium level will be checked Repeat EKG in the morning  Hypotension - resolved  Vomiting Continue Compazine  as needed  Elevated lipase level Lipase 131, this was also elevated on 03/26/24 (122) Continue to monitor lipase level  Mixed hyperlipidemia Continue statin  Generalized weakness/physical  condition Continue fall precaution Continue PT eval and treat  Atrial fibrillation Cardizem   and Lopressor  will be held at this time due to soft BP Warfarin will be held due to supratherapeutic INR (8)  Acquired hypothyroidism Continue Synthroid   GERD Continue Protonix   Right heel pressure sites Continue to avoid pressure on heels Consider wound care if heel ulcers arise    DVT prophylaxis: SCDs  Family Communication: Daughter at bedside (all questions answered to satisfaction)    Advance Care Planning:   Code Status: Limited: Do not attempt resuscitation (DNR) -DNR-LIMITED -Do Not Intubate/DNI    Consults: Nephrology  Severity of Illness: The appropriate patient status for this patient is INPATIENT. Inpatient status is judged to be reasonable and necessary in order to provide the required intensity of service to ensure the patient's safety. The patient's presenting symptoms, physical exam findings, and initial radiographic and laboratory data in the context of their chronic comorbidities is felt to place them at high risk for further clinical deterioration. Furthermore, it is not anticipated that the patient will be medically stable for discharge from the hospital within 2 midnights of admission.   * I certify that at the point of admission it is my clinical judgment that the patient will require inpatient hospital care spanning beyond 2 midnights from the point of admission due to high intensity of service, high risk for further deterioration and high frequency of surveillance required.*  Author: Armanie Ullmer, DO 05/02/2024 4:09 AM  For on call review [email or web address].

## 2024-05-02 ENCOUNTER — Inpatient Hospital Stay: Admitting: Oncology

## 2024-05-02 ENCOUNTER — Inpatient Hospital Stay: Payer: Self-pay | Admitting: Nurse Practitioner

## 2024-05-02 ENCOUNTER — Inpatient Hospital Stay

## 2024-05-02 DIAGNOSIS — R791 Abnormal coagulation profile: Secondary | ICD-10-CM | POA: Insufficient documentation

## 2024-05-02 DIAGNOSIS — I482 Chronic atrial fibrillation, unspecified: Secondary | ICD-10-CM | POA: Insufficient documentation

## 2024-05-02 DIAGNOSIS — N3000 Acute cystitis without hematuria: Secondary | ICD-10-CM

## 2024-05-02 DIAGNOSIS — I9589 Other hypotension: Secondary | ICD-10-CM

## 2024-05-02 DIAGNOSIS — E039 Hypothyroidism, unspecified: Secondary | ICD-10-CM | POA: Diagnosis not present

## 2024-05-02 DIAGNOSIS — R748 Abnormal levels of other serum enzymes: Secondary | ICD-10-CM | POA: Insufficient documentation

## 2024-05-02 DIAGNOSIS — R1110 Vomiting, unspecified: Secondary | ICD-10-CM | POA: Insufficient documentation

## 2024-05-02 LAB — COMPREHENSIVE METABOLIC PANEL WITH GFR
ALT: 9 U/L (ref 0–44)
AST: 16 U/L (ref 15–41)
Albumin: 2.3 g/dL — ABNORMAL LOW (ref 3.5–5.0)
Alkaline Phosphatase: 105 U/L (ref 38–126)
Anion gap: 11 (ref 5–15)
BUN: 43 mg/dL — ABNORMAL HIGH (ref 8–23)
CO2: 24 mmol/L (ref 22–32)
Calcium: 8.6 mg/dL — ABNORMAL LOW (ref 8.9–10.3)
Chloride: 99 mmol/L (ref 98–111)
Creatinine, Ser: 3.17 mg/dL — ABNORMAL HIGH (ref 0.44–1.00)
GFR, Estimated: 13 mL/min — ABNORMAL LOW (ref >60)
Glucose, Bld: 82 mg/dL (ref 70–99)
Potassium: 4.4 mmol/L (ref 3.5–5.1)
Sodium: 134 mmol/L — ABNORMAL LOW (ref 135–145)
Total Bilirubin: 0.6 mg/dL (ref 0.0–1.2)
Total Protein: 5.7 g/dL — ABNORMAL LOW (ref 6.5–8.1)

## 2024-05-02 LAB — PHOSPHORUS: Phosphorus: 3 mg/dL (ref 2.5–4.6)

## 2024-05-02 LAB — CBC
HCT: 26.1 % — ABNORMAL LOW (ref 36.0–46.0)
Hemoglobin: 8.3 g/dL — ABNORMAL LOW (ref 12.0–15.0)
MCH: 29.6 pg (ref 26.0–34.0)
MCHC: 31.8 g/dL (ref 30.0–36.0)
MCV: 93.2 fL (ref 80.0–100.0)
Platelets: 358 K/uL (ref 150–400)
RBC: 2.8 MIL/uL — ABNORMAL LOW (ref 3.87–5.11)
RDW: 14.5 % (ref 11.5–15.5)
WBC: 9.2 K/uL (ref 4.0–10.5)
nRBC: 0 % (ref 0.0–0.2)

## 2024-05-02 LAB — MAGNESIUM: Magnesium: 2 mg/dL (ref 1.7–2.4)

## 2024-05-02 MED ORDER — ONDANSETRON HCL 4 MG PO TABS: 4.0000 mg | ORAL_TABLET | Freq: Four times a day (QID) | ORAL | Status: DC | PRN

## 2024-05-02 MED ORDER — PROCHLORPERAZINE EDISYLATE 10 MG/2ML IJ SOLN: 10.0000 mg | Freq: Four times a day (QID) | INTRAMUSCULAR | Status: DC | PRN

## 2024-05-02 MED ORDER — LEVOTHYROXINE SODIUM 112 MCG PO TABS
112.0000 ug | ORAL_TABLET | Freq: Every day | ORAL | Status: DC
  Administered 2024-05-02 – 2024-05-05 (×4): 112 ug via ORAL
  Filled 2024-05-02 (×4): qty 1

## 2024-05-02 MED ORDER — SODIUM CHLORIDE 0.9 % IV SOLN: INTRAVENOUS | Status: AC

## 2024-05-02 MED ORDER — NYSTATIN-TRIAMCINOLONE 100000-0.1 UNIT/GM-% EX OINT
TOPICAL_OINTMENT | Freq: Two times a day (BID) | CUTANEOUS | Status: DC
  Filled 2024-05-02: qty 15

## 2024-05-02 MED ORDER — METOPROLOL TARTRATE 50 MG PO TABS: 100.0000 mg | ORAL_TABLET | Freq: Two times a day (BID) | ORAL | Status: DC

## 2024-05-02 MED ORDER — PANTOPRAZOLE SODIUM 40 MG PO TBEC
40.0000 mg | DELAYED_RELEASE_TABLET | Freq: Every day | ORAL | Status: DC
  Administered 2024-05-02 – 2024-05-05 (×4): 40 mg via ORAL
  Filled 2024-05-02 (×4): qty 1

## 2024-05-02 MED ORDER — DONEPEZIL HCL 5 MG PO TABS
10.0000 mg | ORAL_TABLET | Freq: Every day | ORAL | Status: DC
  Administered 2024-05-02 – 2024-05-04 (×3): 10 mg via ORAL
  Filled 2024-05-02 (×3): qty 2

## 2024-05-02 MED ORDER — URSODIOL 300 MG PO CAPS
300.0000 mg | ORAL_CAPSULE | Freq: Three times a day (TID) | ORAL | Status: DC
  Administered 2024-05-02 – 2024-05-03 (×4): 300 mg via ORAL
  Filled 2024-05-02 (×7): qty 1

## 2024-05-02 MED ORDER — BUSPIRONE HCL 15 MG PO TABS: 7.5000 mg | ORAL_TABLET | Freq: Two times a day (BID) | ORAL | Status: DC

## 2024-05-02 MED ORDER — SODIUM CHLORIDE 0.9 % IV SOLN
1.0000 g | INTRAVENOUS | Status: DC
  Administered 2024-05-02 – 2024-05-04 (×3): 1 g via INTRAVENOUS
  Filled 2024-05-02 (×4): qty 10

## 2024-05-02 MED ORDER — ACETAMINOPHEN 325 MG PO TABS
650.0000 mg | ORAL_TABLET | Freq: Four times a day (QID) | ORAL | Status: DC | PRN
  Administered 2024-05-03: 650 mg via ORAL
  Filled 2024-05-02: qty 2

## 2024-05-02 MED ORDER — PANTOPRAZOLE SODIUM 40 MG PO TBEC: 80.0000 mg | DELAYED_RELEASE_TABLET | Freq: Every day | ORAL | Status: DC

## 2024-05-02 MED ORDER — FERROUS SULFATE 325 (65 FE) MG PO TABS
325.0000 mg | ORAL_TABLET | Freq: Every day | ORAL | Status: DC
  Administered 2024-05-03 – 2024-05-05 (×3): 325 mg via ORAL
  Filled 2024-05-02 (×3): qty 1

## 2024-05-02 MED ORDER — NYSTATIN 100000 UNIT/GM EX POWD
Freq: Three times a day (TID) | CUTANEOUS | Status: DC
  Administered 2024-05-03: 1 via TOPICAL
  Filled 2024-05-02: qty 15

## 2024-05-02 MED ORDER — BUSPIRONE HCL 15 MG PO TABS
7.5000 mg | ORAL_TABLET | Freq: Two times a day (BID) | ORAL | Status: DC
  Administered 2024-05-02 – 2024-05-05 (×7): 7.5 mg via ORAL
  Filled 2024-05-02 (×4): qty 1
  Filled 2024-05-02: qty 2
  Filled 2024-05-02 (×2): qty 1

## 2024-05-02 MED ORDER — URSODIOL 300 MG PO CAPS: 300.0000 mg | ORAL_CAPSULE | Freq: Three times a day (TID) | ORAL | Status: DC

## 2024-05-02 MED ORDER — ACETAMINOPHEN 650 MG RE SUPP: 650.0000 mg | Freq: Four times a day (QID) | RECTAL | Status: DC | PRN

## 2024-05-02 MED ORDER — ONDANSETRON HCL 4 MG/2ML IJ SOLN: 4.0000 mg | Freq: Four times a day (QID) | INTRAMUSCULAR | Status: DC | PRN

## 2024-05-02 MED ORDER — LACTATED RINGERS IV SOLN: INTRAVENOUS | Status: AC

## 2024-05-02 MED ORDER — NYSTATIN 100000 UNIT/GM EX CREA
TOPICAL_CREAM | Freq: Two times a day (BID) | CUTANEOUS | Status: DC
  Administered 2024-05-03: 1 via TOPICAL
  Filled 2024-05-02: qty 15

## 2024-05-02 MED ORDER — DILTIAZEM HCL ER COATED BEADS 240 MG PO CP24
240.0000 mg | ORAL_CAPSULE | Freq: Every day | ORAL | Status: DC
Start: 2024-05-03 — End: 2024-05-05
  Administered 2024-05-03 – 2024-05-05 (×3): 240 mg via ORAL
  Filled 2024-05-02 (×3): qty 1

## 2024-05-02 NOTE — Progress Notes (Signed)
 PROGRESS NOTE  [name], is a 89 y.o. female, DOB - 11-02-1925, FMW:[id number]  Admit date - 05/01/2024   Admitting Physician Hayley Maier, DO  Outpatient Primary MD for the patient is Hayley Rollene BRAVO, MD  LOS - 1  Chief Complaint  Patient presents with   Emesis    Brief Narrative:  89 y.o. female with medical history significant of HTN, HLD,  CKD stage IV, PAF on Coumadin , symptomatic bradycardia status post PPM admitted on 05/02/2023 with concerns for AKI in the setting of presumed UTI,   -Assessment and Plan: 1)AKI----acute kidney injury on CKD stage -IV---patient had soft BP and poor oral intake -Creatinine on admission= 3.26 , - baseline creatinine = 1.9 to 2.1   ,  -creatinine is now= 3.17 , --Continue IV fluids and encourage adequate oral intake --Renally adjust medications, avoid nephrotoxic agents / dehydration  / hypotension  2) hyponatremia/dehydration--- continue IV fluids - Continue to encourage adequate oral intake  3) presumed UTI--continue IV Rocephin  pending urine and blood culture data -- WBC 11.4 >>9.2  4) chronic anemia of CKD--Hgb greater than 8 -Continue PTA iron supplements - No overt bleeding = Anticipate drop in Hgb with hydration  5)PAFIb-history of bradycardia-status post pacemaker placement -Hold Cardizem  and metoprolol  due to soft BP -pharmacy to manage Coumadin  therapy  6)Pressure Injury--see photos in epic -- Wound care consult appreciated  7) generalized weakness/deconditioning--- PT eval requested  8) hypothyroidism--- continue levothyroxine   9)Dementia--- stable, continue Aricept , BuSpar   10)GERD--- continue Protonix   Status is: Inpatient   Disposition: The patient is from: Home              Anticipated d/c is to: Home              Anticipated d/c date is: 2 days              Patient currently is not medically stable to d/c. Barriers: Not Clinically Stable-   Code Status :  -  Code Status: Limited: Do not attempt  resuscitation (DNR) -DNR-LIMITED -Do Not Intubate/DNI    Family Communication:    (patient is alert, awake and coherent)   Discussed with daughter [name], at bedside, granddaughter [name] on the phone, questions answered  DVT Prophylaxis  :   - SCDs   SCDs Start: 05/02/24 0354   Lab Results  Component Value Date   PLT 358 05/02/2024    Inpatient Medications  Scheduled Meds:  busPIRone   7.5 mg Oral BID   [START ON 05/03/2024] diltiazem   240 mg Oral Daily   donepezil   10 mg Oral QHS   [START ON 05/03/2024] ferrous sulfate   325 mg Oral Q breakfast   levothyroxine   112 mcg Oral Q0600   pantoprazole   40 mg Oral Daily   ursodiol   300 mg Oral TID   Continuous Infusions:  sodium chloride  83 mL/hr at 05/02/24 1717   cefTRIAXone  (ROCEPHIN )  IV     PRN Meds:.acetaminophen  **OR** acetaminophen , prochlorperazine    Anti-infectives (From admission, onward)    Start     Dose/Rate Route Frequency Ordered Stop   05/02/24 2200  cefTRIAXone  (ROCEPHIN ) 1 g in sodium chloride  0.9 % 100 mL IVPB        1 g 200 mL/hr over 30 Minutes Intravenous Every 24 hours 05/02/24 0744     05/01/24 2215  cefTRIAXone  (ROCEPHIN ) 1 g in sodium chloride  0.9 % 100 mL IVPB        1 g 200 mL/hr over 30 Minutes Intravenous  Once  05/01/24 2207 05/01/24 2317       Subjective: [name] today has no fevers, no emesis,  No chest pain,    Discussed with daughter [name], at bedside, granddaughter [name] on the phone, questions answered   Objective: Vitals:   05/02/24 0524 05/02/24 0806 05/02/24 1207 05/02/24 1348  BP: (!) 103/50 (!) 107/57 (!) 119/59 (!) 97/59  Pulse: 86 71 78 81  Resp: 16 18 18 17   Temp: 98 F (36.7 C) 98.5 F (36.9 C) 98.6 F (37 C) 98.2 F (36.8 C)  TempSrc: Oral Oral Oral Oral  SpO2: 94% 93% 98% 94%  Weight:        Intake/Output Summary (Last 24 hours) at 05/02/2024 1849 Last data filed at 05/02/2024 1717 Gross per 24 hour  Intake 840 ml  Output --  Net 840 ml   Filed Weights    05/02/24 0025  Weight: 73.2 kg    Physical Exam  Gen:- Awake Alert,  in no apparent distress  HEENT:- [place].AT, No sclera icterus Neck-Supple Neck,No JVD,.  Lungs-  CTAB , fair symmetrical air movement CV- S1, S2 normal, regular  Abd-  +ve B.Sounds, Abd Soft, No tenderness,    Extremity/Skin:- No  edema, pedal pulses present  Psych-affect is appropriate, oriented x3 Neuro-no new focal deficits, no tremors  Data Reviewed: I have personally reviewed following labs and imaging studies  CBC: Recent Labs  Lab 05/01/24 1917 05/02/24 0434  WBC 11.4* 9.2  NEUTROABS 8.6*  --   HGB 8.8* 8.3*  HCT 27.1* 26.1*  MCV 93.1 93.2  PLT 413* 358   Basic Metabolic Panel: Recent Labs  Lab 05/01/24 1917 05/02/24 0434  NA 133* 134*  K 4.5 4.4  CL 97* 99  CO2 24 24  GLUCOSE 99 82  BUN 45* 43*  CREATININE 3.26* 3.17*  CALCIUM 9.0 8.6*  MG 2.3 2.0  PHOS  --  3.0   GFR: Estimated Creatinine Clearance: 10.8 mL/min (A) (by C-G formula based on SCr of 3.17 mg/dL (H)). Liver Function Tests: Recent Labs  Lab 05/01/24 1917 05/02/24 0434  AST 17 16  ALT 10 9  ALKPHOS 118 105  BILITOT 0.6 0.6  PROT 6.3* 5.7*  ALBUMIN  2.6* 2.3*   Recent Results (from the past 240 hours)  Resp panel by RT-PCR (RSV, Flu A&B, Covid) Anterior Nasal Swab     Status: None   Collection Time: 05/01/24  7:00 PM   Specimen: Anterior Nasal Swab  Result Value Ref Range Status   SARS Coronavirus 2 by RT PCR NEGATIVE NEGATIVE Final    Comment: (NOTE) SARS-CoV-2 target nucleic acids are NOT DETECTED.  The SARS-CoV-2 RNA is generally detectable in upper respiratory specimens during the acute phase of infection. The lowest concentration of SARS-CoV-2 viral copies this assay can detect is 138 copies/mL. A negative result does not preclude SARS-Cov-2 infection and should not be used as the sole basis for treatment or other patient management decisions. A negative result may occur with  improper specimen  collection/handling, submission of specimen other than nasopharyngeal swab, presence of viral mutation(s) within the areas targeted by this assay, and inadequate number of viral copies(<138 copies/mL). A negative result must be combined with clinical observations, patient history, and epidemiological information. The expected result is Negative.  Fact Sheet for Patients:  BloggerCourse.com  Fact Sheet for Healthcare Providers:  SeriousBroker.it  This test is no t yet approved or cleared by the [place]  FDA and  has been authorized for detection and/or diagnosis  of SARS-CoV-2 by FDA under an Emergency Use Authorization (EUA). This EUA will remain  in effect (meaning this test can be used) for the duration of the COVID-19 declaration under Section 564(b)(1) of the Act, 21 U.S.C.section 360bbb-3(b)(1), unless the authorization is terminated  or revoked sooner.       Influenza A by PCR NEGATIVE NEGATIVE Final   Influenza B by PCR NEGATIVE NEGATIVE Final    Comment: (NOTE) The Xpert Xpress SARS-CoV-2/FLU/RSV plus assay is intended as an aid in the diagnosis of influenza from Nasopharyngeal swab specimens and should not be used as a sole basis for treatment. Nasal washings and aspirates are unacceptable for Xpert Xpress SARS-CoV-2/FLU/RSV testing.  Fact Sheet for Patients: BloggerCourse.com  Fact Sheet for Healthcare Providers: SeriousBroker.it  This test is not yet approved or cleared by the [place]  FDA and has been authorized for detection and/or diagnosis of SARS-CoV-2 by FDA under an Emergency Use Authorization (EUA). This EUA will remain in effect (meaning this test can be used) for the duration of the COVID-19 declaration under Section 564(b)(1) of the Act, 21 U.S.C. section 360bbb-3(b)(1), unless the authorization is terminated or revoked.     Resp Syncytial  Virus by PCR NEGATIVE NEGATIVE Final    Comment: (NOTE) Fact Sheet for Patients: BloggerCourse.com  Fact Sheet for Healthcare Providers: SeriousBroker.it  This test is not yet approved or cleared by the [place]  FDA and has been authorized for detection and/or diagnosis of SARS-CoV-2 by FDA under an Emergency Use Authorization (EUA). This EUA will remain in effect (meaning this test can be used) for the duration of the COVID-19 declaration under Section 564(b)(1) of the Act, 21 U.S.C. section 360bbb-3(b)(1), unless the authorization is terminated or revoked.  Performed at [facility], [address] [place], [place] [address]   Blood culture (routine x 2)     Status: None (Preliminary result)   Collection Time: 05/01/24  7:17 PM   Specimen: BLOOD  Result Value Ref Range Status   Specimen Description BLOOD LEFT ANTECUBITAL  Final   Special Requests   Final    BOTTLES DRAWN AEROBIC AND ANAEROBIC Blood Culture adequate volume   Culture   Final    NO GROWTH < 12 HOURS Performed at [facility], [address], [place], [place] [address]    Report Status PENDING  Incomplete  Blood culture (routine x 2)     Status: None (Preliminary result)   Collection Time: 05/01/24  7:19 PM   Specimen: BLOOD RIGHT FOREARM  Result Value Ref Range Status   Specimen Description BLOOD RIGHT FOREARM  Final   Special Requests   Final    BOTTLES DRAWN AEROBIC ONLY Blood Culture results may not be optimal due to an inadequate volume of blood received in culture bottles   Culture   Final    NO GROWTH < 12 HOURS Performed at [facility], [address], [place], [place] [address]    Report Status PENDING  Incomplete    Scheduled Meds:  busPIRone   7.5 mg Oral BID   [START ON 05/03/2024] diltiazem   240 mg Oral Daily   donepezil   10 mg Oral QHS   [START ON 05/03/2024] ferrous sulfate   325 mg Oral Q breakfast   levothyroxine   112 mcg Oral Q0600    pantoprazole   40 mg Oral Daily   ursodiol   300 mg Oral TID   Continuous Infusions:  sodium chloride  83 mL/hr at 05/02/24 1717   cefTRIAXone  (ROCEPHIN )  IV  LOS: 1 day   Rendall Carwin M.D on 05/02/2024 at 6:49 PM  Go to [email or web address] - for contact info  [facility] - Office  [phone]  If 7PM-7AM, please contact night-coverage [email or web address] 05/02/2024, 6:49 PM

## 2024-05-02 NOTE — Progress Notes (Signed)
 PHARMACY - ANTICOAGULATION CONSULT NOTE  Pharmacy Consult for warfarin Indication: atrial fibrillation  No Known Allergies  Patient Measurements: Weight: 73.2 kg (161 lb 6 oz)  Vital Signs: Temp: 98.5 F (36.9 C) (09/05 0806) Temp Source: Oral (09/05 0806) BP: 107/57 (09/05 0806) Pulse Rate: 71 (09/05 0806)  Labs: Recent Labs    05/01/24 1415 05/01/24 1917 05/01/24 2117 05/02/24 0434  HGB  --  8.8*  --  8.3*  HCT  --  27.1*  --  26.1*  PLT  --  413*  --  358  INR 8.0*  --   --   --   CREATININE  --  3.26*  --  3.17*  TROPONINIHS  --  8 8  --     Estimated Creatinine Clearance: 10.8 mL/min (A) (by C-G formula based on SCr of 3.17 mg/dL (H)).   Medical History: Past Medical History:  Diagnosis Date   Allergy    Annual physical exam 04/22/2015   Anxiety    Arthritis    Arthritis    Asthma    Atrial fibrillation (HCC)    Bleeding disorder (HCC)    CHF (congestive heart failure) (HCC)    CKD (chronic kidney disease) stage 4, GFR 15-29 ml/min (HCC) 09/08/2021   COPD (chronic obstructive pulmonary disease) (HCC)    Depression    Gastroesophageal reflux disease    Hearing impairment    Heart disease    Heart murmur    High cholesterol    Hyperlipidemia    Hypertension    Hypothyroidism    Impaired glucose tolerance    Macular degeneration    Oxygen  deficiency    Pancreatitis    Paroxysmal atrial fibrillation (HCC) 2011   Sick sinus syndrome (HCC) 2011   Atrial fibrilation 10/2009; and dual chamber pacemaker in 4/11; normal EF   Sleep apnea    Nocturnal oxygen  therapy   Zoster 2016    Medications:  Medications Prior to Admission  Medication Sig Dispense Refill Last Dose/Taking   busPIRone  (BUSPAR ) 7.5 MG tablet TAKE (1) TABLET BY MOUTH TWICE DAILY. 60 tablet 2 05/01/2024   diltiazem  (CARDIZEM  CD) 240 MG 24 hr capsule Take 1 capsule (240 mg total) by mouth daily. 90 capsule 1 05/01/2024   donepezil  (ARICEPT ) 10 MG tablet Take 1 tablet (10 mg total) by mouth  at bedtime. 90 tablet 0 Past Week   ferrous sulfate  325 (65 FE) MG tablet Take 325 mg by mouth daily with breakfast.   05/01/2024   levothyroxine  (SYNTHROID ) 112 MCG tablet Take 1 tablet (112 mcg total) by mouth daily. 90 tablet 0 05/01/2024   omeprazole  (PRILOSEC) 40 MG capsule Take 1 capsule (40 mg total) by mouth daily. 90 capsule 0 05/01/2024   ursodiol  (ACTIGALL ) 300 MG capsule Take 1 capsule (300 mg total) by mouth 3 (three) times daily. 90 capsule 2 05/01/2024    Assessment: 89 y.o. female with PMH as listed below who presents with [facility] from home, presented with emesis. On chronic anticoagulation for afib with coumadin . INR > 8. No s/s of bleeding.   INR elevated supratherapeutic  Goal of Therapy:  INR 2-3 Monitor platelets by anticoagulation protocol: Yes   Plan:  No coumadin  today Daily PT/INR Monitor for S/S of bleeding  Cherlyn Boers, BS Pharm D, BCPS Clinical Pharmacist 05/02/2024,10:31 AM

## 2024-05-02 NOTE — Care Management Important Message (Signed)
 Important Message  Patient Details  Name: [name] MRN: [id number] Date of Birth: 1926-03-03   Important Message Given:  Yes - Medicare IM     Prisca Gearing L Kamari Buch 05/02/2024, 10:31 AM

## 2024-05-02 NOTE — TOC Initial Note (Signed)
 Transition of Care [facility]) - Initial/Assessment Note    Patient Details  Name: [name] MRN: [id number] Date of Birth: 16-Jan-1926  Transition [facility]) CM/SW Contact:    Nena LITTIE Coffee, RN Phone Number: 05/02/2024, 1:11 PM  Clinical Narrative:                 Pt is from home, lives c/her daughter, and also receives help from her granddaughter. She is active c/[facility], PT, OT and has home oxygen  for bedtime use (unknown provider). In the home they have a hospital bed, E-Z lift, wc, and lift chair. Pt will need stretcher transport at dc and will return home c/her daughter.   Expected Discharge Plan: Home w Home Health Services Barriers to Discharge: Continued Medical Work up   Patient Goals and CMS Choice Patient states their goals for this hospitalization and ongoing recovery are:: Return home c/family          Expected Discharge Plan and Services In-house Referral: Clinical Social Work Discharge Planning Services: CM Consult Post Acute Care Choice: Home Health Living arrangements for the past 2 months: Single Family Home                             HH Agency: [facility]        Prior Living Arrangements/Services Living arrangements for the past 2 months: Single Family Home Lives with:: Adult Children Patient language and need for interpreter reviewed:: Yes Do you feel safe going back to the place where you live?: Yes      Need for Family Participation in Patient Care: Yes (Comment) Care giver support system in place?: Yes (comment) Current home services: DME (hosptial bed, E-Z lift, wc, lift chair) Criminal Activity/Legal Involvement Pertinent to Current Situation/Hospitalization: No - Comment as needed  Activities of Daily Living   ADL Screening (condition at time of admission) Independently performs ADLs?: No Does the patient have a NEW difficulty with bathing/dressing/toileting/self-feeding that is expected to last >3 days?:  No Does the patient have a NEW difficulty with getting in/out of bed, walking, or climbing stairs that is expected to last >3 days?: Yes (Initiates electronic notice to provider for possible PT consult) Does the patient have a NEW difficulty with communication that is expected to last >3 days?: No  Permission Sought/Granted                  Emotional Assessment Appearance:: Appears stated age   Affect (typically observed): Calm Orientation: : Oriented to Self, Oriented to Place Alcohol / Substance Use: Not Applicable Psych Involvement: No (comment)  Admission diagnosis:  UTI (urinary tract infection) [N39.0] Acute cystitis with hematuria [N30.01] AKI (acute kidney injury) (HCC) [N17.9] Nausea and vomiting, unspecified vomiting type [R11.2] Patient Active Problem List   Diagnosis Date Noted   Vomiting 05/02/2024   Elevated lipase 05/02/2024   Atrial fibrillation, chronic (HCC) 05/02/2024   Supratherapeutic INR 05/02/2024   UTI (urinary tract infection) 05/01/2024   Pain 04/16/2024   Muscle wasting and atrophy, not elsewhere classified, right upper arm 04/07/2024   Cognitive communication deficit 04/04/2024   Muscle weakness (generalized) 04/04/2024   Unsteady gait when walking 04/04/2024   Chronic diastolic heart failure (HCC) 04/03/2024   Depression 04/03/2024   History of recurrent UTIs 04/03/2024   Insomnia, unspecified 04/03/2024   Iron deficiency anemia due to chronic blood loss 04/03/2024   Macular degeneration 04/03/2024   Loss of  appetite 03/28/2024   Acute kidney injury superimposed on stage 5 chronic kidney disease, not on chronic dialysis (HCC) 03/27/2024   Biliary obstruction 03/27/2024   Chronic constipation 03/19/2024   Encounter for immunization 12/02/2023   Dysuria 06/03/2023   Dermatomycosis 06/03/2023   Hypotension 02/11/2023   Vitamin D  deficiency 09/07/2022   Encounter for Medicare annual examination with abnormal findings 04/24/2022   Malodorous  urine 04/24/2022   Dermatitis 04/24/2022   CKD (chronic kidney disease) stage 4, GFR 15-29 ml/min (HCC) 09/08/2021   Stress due to family tension 08/16/2021   Skin lesion of face 02/24/2021   Recurrent UTI 08/14/2019   Generalized weakness 07/04/2019   Atrial fibrillation with RVR (HCC) 07/03/2019   Vertigo 05/18/2019   Nocturnal hypoxia 12/03/2016   Osteopenia 10/18/2016   At high risk for falls 10/21/2015   Seasonal allergies 05/05/2014   GERD (gastroesophageal reflux disease) 05/05/2014   Sick sinus syndrome (HCC)    Chronic anticoagulation 11/18/2010   Impaired fasting glucose 10/14/2010   Paroxysmal atrial fibrillation (HCC) 06/14/2010   PPM-St.Jude 03/30/2010   GAD (generalized anxiety disorder) 12/05/2009   Impaired memory 03/03/2009   Acquired hypothyroidism 02/25/2008   Mixed hyperlipidemia 02/25/2008   Hearing loss 02/25/2008   Essential hypertension 02/25/2008   PCP:  [name], MD Pharmacy:   [facility] [place], [place] - [address] [place] [address] Phone: [phone] Fax: [phone]     Social Drivers of Health (SDOH) Social History: SDOH Screenings   Food Insecurity: No Food Insecurity (05/02/2024)  Housing: Low Risk  (05/02/2024)  Transportation Needs: No Transportation Needs (05/02/2024)  Utilities: Not At Risk (05/02/2024)  Alcohol Screen: Low Risk  (09/04/2023)  Depression (PHQ2-9): Low Risk  (03/19/2024)  Financial Resource Strain: Low Risk  (09/04/2023)  Physical Activity: Inactive (09/04/2023)  Social Connections: Socially Isolated (05/02/2024)  Stress: No Stress Concern Present (09/04/2023)  Tobacco Use: Low Risk  (04/21/2024)  Health Literacy: High Risk (04/10/2024)   Received from [facility]   SDOH Interventions:     Readmission Risk Interventions    05/02/2024    1:10 PM 05/02/2024   11:14 AM 04/01/2024   11:40 AM  Readmission Risk Prevention Plan  Transportation Screening Complete Complete  Complete  PCP or Specialist Appt within 5-7 Days   Complete  PCP or Specialist Appt within 3-5 Days Complete Complete   Home Care Screening   Complete  Medication Review (RN CM)   Complete  HRI or Home Care Consult Complete Complete   Social Work Consult for Recovery Care Planning/Counseling Complete Complete   Palliative Care Screening Not Applicable Complete   Medication Review Oceanographer) Complete Complete

## 2024-05-02 NOTE — Consult Note (Incomplete)
 WOC Nurse Consult Note: Reason for Consult: black/purple coloration of bilateral heels, skin tear right forearm, skin tear left buttocks, redness perineum and buttocks ALL present on admission per nursing report    Wound type: Pressure Injury POA: Yes Measurement: Wound bed: Drainage (amount, consistency, odor)  Periwound: Dressing procedure/placement/frequency: Conservative sharp wound debridement (CSWD performed at the bedside):

## 2024-05-02 NOTE — Plan of Care (Signed)
  Problem: Acute Rehab PT Goals(only PT should resolve) Goal: Pt Will Go Supine/Side To Sit Flowsheets (Taken 05/02/2024 1228) Pt will go Supine/Side to Sit: with minimal assist Goal: Patient Will Perform Sitting Balance Flowsheets (Taken 05/02/2024 1228) Patient will perform sitting balance:  with minimal assist  1-2 min  with no UE support Note: And reduced posterior lean  Goal: Patient Will Transfer Sit To/From Stand Flowsheets (Taken 05/02/2024 1228) Patient will transfer sit to/from stand:  with minimal assist  with moderate assist

## 2024-05-02 NOTE — Evaluation (Signed)
 Physical Therapy Evaluation Patient Details Name: [name] MRN: [id number] DOB: 1925/10/25 Today's Date: 05/02/2024  History of Present Illness  [name] is a 89 y.o. female with medical history significant of hypertension, hyperlipidemia, CKD stage IV, PAF on Coumadin , symptomatic bradycardia status post PPM who presents to the emergency department from home via EMS due to an episode of vomiting which occurred about 15 to 20 minutes after taking medication this afternoon (around 3 PM).  She also had low blood pressure with SBP of 85, she was thought to be dehydrated by daughter, so, EMS was activated.  She was recently admitted from 7/30 through 8/7 due to concern for cholangitis and AKI superimposed on CKD 4.   Clinical Impression  Patient demonstrates slow labored movement for sitting up at bedside as she is able to initiate the movement but requires much assist to sit upright secondary to core weakness and persistent posterior lean. Patient is slightly apprehensive to movement, but tolerates therapeutic exercises well without increase in symptoms. Unable to attempt transfers/ambulation due pts BIL heal with presence of eschar. Patient will benefit from continued skilled physical therapy in hospital and recommended venue below to increase strength, balance, endurance for safe ADLs and gait.        If plan is discharge home, recommend the following: A lot of help with walking and/or transfers;A lot of help with bathing/dressing/bathroom;Assist for transportation   Can travel by private vehicle        Equipment Recommendations None recommended by PT  Recommendations for Other Services       Functional Status Assessment Patient has had a recent decline in their functional status and demonstrates the ability to make significant improvements in function in a reasonable and predictable amount of time.     Precautions / Restrictions Precautions Precautions: Fall Recall of  Precautions/Restrictions: Impaired Restrictions Weight Bearing Restrictions Per Provider Order: No      Mobility  Bed Mobility Overal bed mobility: Needs Assistance Bed Mobility: Supine to Sit     Supine to sit: Mod assist     General bed mobility comments: Labored movement, pt requires assist for pull to sit and full upright positioning due to posterior lean    Transfers                   General transfer comment: Unable to transfer    Ambulation/Gait               General Gait Details: Unable to ambulate  Stairs            Wheelchair Mobility     Tilt Bed    Modified Rankin (Stroke Patients Only)       Balance Overall balance assessment: Needs assistance Sitting-balance support: No upper extremity supported, Feet supported Sitting balance-Leahy Scale: Poor Sitting balance - Comments: seated EOB Postural control: Posterior lean     Standing balance comment: Unable to test                             Pertinent Vitals/Pain Pain Assessment Pain Assessment: No/denies pain    Home Living Family/patient expects to be discharged to:: Private residence Living Arrangements: Children Available Help at Discharge: Family;Available 24 hours/day Type of Home: House Home Access: Stairs to enter Entrance Stairs-Rails: Doctor, general practice of Steps: 1   Home Layout: One level Home Equipment: Agricultural consultant (2 wheels);Cane - single point;Shower seat Additional Comments: No change  in home living per daughter    Prior Function Prior Level of Function : Needs assist       Physical Assist : Mobility (physical);ADLs (physical) Mobility (physical): Bed mobility;Transfers;Gait ADLs (physical): Grooming;Bathing;Dressing;Toileting;IADLs Mobility Comments: Per daughter, pt transfers to w/c from hoyer lift, has not stood since prior admission due to chronic heel wounds ADLs Comments: Per daughter, assistance for ADLs      Extremity/Trunk Assessment        Lower Extremity Assessment Lower Extremity Assessment: Generalized weakness    Cervical / Trunk Assessment Cervical / Trunk Assessment: Kyphotic  Communication   Communication Communication: Impaired    Cognition Arousal: Alert Behavior During Therapy: WFL for tasks assessed/performed, Anxious   PT - Cognitive impairments: No apparent impairments                       PT - Cognition Comments: slightly apprehensive to movement Following commands: Impaired Following commands impaired: Follows one step commands inconsistently     Cueing Cueing Techniques: Verbal cues, Tactile cues     General Comments General comments (skin integrity, edema, etc.): BIL heel wounds, eschar    Exercises General Exercises - Lower Extremity Ankle Circles/Pumps: AROM, 15 reps, Both, Seated Long Arc Quad: 10 reps, Both, AROM, Strengthening, Seated Hip Flexion/Marching: 10 reps, Both, Seated, Strengthening, AROM   Assessment/Plan    PT Assessment Patient needs continued PT services  PT Problem List Decreased balance;Decreased activity tolerance;Decreased mobility;Decreased strength       PT Treatment Interventions Therapeutic activities;Therapeutic exercise;Patient/family education;Balance training;Functional mobility training    PT Goals (Current goals can be found in the Care Plan section)  Acute Rehab PT Goals Patient Stated Goal: return home PT Goal Formulation: With patient/family Time For Goal Achievement: 05/16/24 Potential to Achieve Goals: Good    Frequency Min 3X/week     Co-evaluation               AM-PAC PT 6 Clicks Mobility  Outcome Measure Help needed turning from your back to your side while in a flat bed without using bedrails?: A Little Help needed moving from lying on your back to sitting on the side of a flat bed without using bedrails?: A Lot Help needed moving to and from a bed to a chair (including a  wheelchair)?: A Lot Help needed standing up from a chair using your arms (e.g., wheelchair or bedside chair)?: A Lot Help needed to walk in hospital room?: Total Help needed climbing 3-5 steps with a railing? : Total 6 Click Score: 11    End of Session   Activity Tolerance: Patient limited by fatigue Patient left: in bed;with family/visitor present;with call bell/phone within reach Nurse Communication: Mobility status PT Visit Diagnosis: Muscle weakness (generalized) (M62.81);Other abnormalities of gait and mobility (R26.89);Unsteadiness on feet (R26.81)    Time: 9157-9094 PT Time Calculation (min) (ACUTE ONLY): 23 min   Charges:   PT Evaluation $PT Eval Moderate Complexity: 1 Mod PT Treatments $Therapeutic Activity: 23-37 mins PT General Charges $$ ACUTE PT VISIT: 1 Visit        12:27 PM, 05/02/24,  Onnie Como, SPT

## 2024-05-03 DIAGNOSIS — E039 Hypothyroidism, unspecified: Secondary | ICD-10-CM | POA: Diagnosis not present

## 2024-05-03 DIAGNOSIS — K219 Gastro-esophageal reflux disease without esophagitis: Secondary | ICD-10-CM | POA: Diagnosis not present

## 2024-05-03 DIAGNOSIS — I482 Chronic atrial fibrillation, unspecified: Secondary | ICD-10-CM | POA: Diagnosis not present

## 2024-05-03 DIAGNOSIS — N3000 Acute cystitis without hematuria: Secondary | ICD-10-CM | POA: Diagnosis not present

## 2024-05-03 LAB — CBC
HCT: 27.4 % — ABNORMAL LOW (ref 36.0–46.0)
Hemoglobin: 8.3 g/dL — ABNORMAL LOW (ref 12.0–15.0)
MCH: 29.2 pg (ref 26.0–34.0)
MCHC: 30.3 g/dL (ref 30.0–36.0)
MCV: 96.5 fL (ref 80.0–100.0)
Platelets: 336 K/uL (ref 150–400)
RBC: 2.84 MIL/uL — ABNORMAL LOW (ref 3.87–5.11)
RDW: 14.3 % (ref 11.5–15.5)
WBC: 9.3 K/uL (ref 4.0–10.5)
nRBC: 0 % (ref 0.0–0.2)

## 2024-05-03 LAB — RENAL FUNCTION PANEL
Albumin: 2.4 g/dL — ABNORMAL LOW (ref 3.5–5.0)
Anion gap: 13 (ref 5–15)
BUN: 39 mg/dL — ABNORMAL HIGH (ref 8–23)
CO2: 22 mmol/L (ref 22–32)
Calcium: 8.7 mg/dL — ABNORMAL LOW (ref 8.9–10.3)
Chloride: 97 mmol/L — ABNORMAL LOW (ref 98–111)
Creatinine, Ser: 2.92 mg/dL — ABNORMAL HIGH (ref 0.44–1.00)
GFR, Estimated: 14 mL/min — ABNORMAL LOW (ref >60)
Glucose, Bld: 83 mg/dL (ref 70–99)
Phosphorus: 2.9 mg/dL (ref 2.5–4.6)
Potassium: 4 mmol/L (ref 3.5–5.1)
Sodium: 132 mmol/L — ABNORMAL LOW (ref 135–145)

## 2024-05-03 LAB — PROTIME-INR
INR: 5.5 (ref 0.8–1.2)
Prothrombin Time: 51.9 s — ABNORMAL HIGH (ref 11.4–15.2)

## 2024-05-03 MED ORDER — AMOXICILLIN 250 MG PO CAPS
500.0000 mg | ORAL_CAPSULE | Freq: Two times a day (BID) | ORAL | Status: DC
  Administered 2024-05-03 – 2024-05-04 (×3): 500 mg via ORAL
  Filled 2024-05-03 (×3): qty 2

## 2024-05-03 NOTE — Progress Notes (Signed)
 PHARMACY - ANTICOAGULATION CONSULT NOTE  Pharmacy Consult for warfarin Indication: atrial fibrillation  No Known Allergies  Patient Measurements: Weight: 73.2 kg (161 lb 6 oz)  Vital Signs: Temp: 98.2 F (36.8 C) (09/06 0508) BP: 120/81 (09/06 0800) Pulse Rate: 100 (09/06 0800)  Labs: Recent Labs    05/01/24 1415 05/01/24 1917 05/01/24 1917 05/01/24 2117 05/02/24 0434 05/03/24 0657  HGB  --  8.8*   < >  --  8.3* 8.3*  HCT  --  27.1*  --   --  26.1* 27.4*  PLT  --  413*  --   --  358 336  LABPROT  --   --   --   --   --  51.9*  INR 8.0*  --   --   --   --  5.5*  CREATININE  --  3.26*  --   --  3.17*  --   TROPONINIHS  --  8  --  8  --   --    < > = values in this interval not displayed.    Estimated Creatinine Clearance: 10.8 mL/min (A) (by C-G formula based on SCr of 3.17 mg/dL (H)).   Medical History: Past Medical History:  Diagnosis Date   Allergy    Annual physical exam 04/22/2015   Anxiety    Arthritis    Arthritis    Asthma    Atrial fibrillation (HCC)    Bleeding disorder (HCC)    CHF (congestive heart failure) (HCC)    CKD (chronic kidney disease) stage 4, GFR 15-29 ml/min (HCC) 09/08/2021   COPD (chronic obstructive pulmonary disease) (HCC)    Depression    Gastroesophageal reflux disease    Hearing impairment    Heart disease    Heart murmur    High cholesterol    Hyperlipidemia    Hypertension    Hypothyroidism    Impaired glucose tolerance    Macular degeneration    Oxygen  deficiency    Pancreatitis    Paroxysmal atrial fibrillation (HCC) 2011   Sick sinus syndrome (HCC) 2011   Atrial fibrilation 10/2009; and dual chamber pacemaker in 4/11; normal EF   Sleep apnea    Nocturnal oxygen  therapy   Zoster 2016    Medications:  Medications Prior to Admission  Medication Sig Dispense Refill Last Dose/Taking   busPIRone  (BUSPAR ) 7.5 MG tablet TAKE (1) TABLET BY MOUTH TWICE DAILY. 60 tablet 2 05/01/2024   diltiazem  (CARDIZEM  CD) 240 MG 24 hr  capsule Take 1 capsule (240 mg total) by mouth daily. 90 capsule 1 05/01/2024   donepezil  (ARICEPT ) 10 MG tablet Take 1 tablet (10 mg total) by mouth at bedtime. 90 tablet 0 Past Week   ferrous sulfate  325 (65 FE) MG tablet Take 325 mg by mouth daily with breakfast.   05/01/2024   levothyroxine  (SYNTHROID ) 112 MCG tablet Take 1 tablet (112 mcg total) by mouth daily. 90 tablet 0 05/01/2024   omeprazole  (PRILOSEC) 40 MG capsule Take 1 capsule (40 mg total) by mouth daily. 90 capsule 0 05/01/2024   ursodiol  (ACTIGALL ) 300 MG capsule Take 1 capsule (300 mg total) by mouth 3 (three) times daily. 90 capsule 2 05/01/2024    Assessment: 89 y.o. female with PMH as listed below who presents with [facility] from home, presented with emesis. On chronic anticoagulation for afib with coumadin . INR > 8. No s/s of bleeding.   INR 8> 5.5, supratherapeutic but trending down  Goal of Therapy:  INR 2-3 Monitor  platelets by anticoagulation protocol: Yes   Plan:  No coumadin  today Daily PT/INR Monitor for S/S of bleeding  Cherlyn Boers, BS Pharm D, BCPS Clinical Pharmacist 05/03/2024,8:58 AM

## 2024-05-03 NOTE — Consult Note (Signed)
 WOC Nurse Consult Note: Reason for Consult: perineum and B heels  Wound type: 1. Moisture Associated Skin Damage to buttocks/sacrum  2. Intertriginous dermatitis B groin  ICD-10 CM Codes for Irritant Dermatitis L24A2 - Due to fecal, urinary or dual incontinence L30.4  - Erythema intertrigo. Also used for abrasion of the hand, chafing of the skin, dermatitis due to sweating and friction, friction dermatitis, friction eczema, and genital/thigh intertrigo.  3.  B heels DTPI that has now evolved to unstageable with dry eschar  Pressure Injury POA: Yes Measurement: see nursing flowsheet  Wound bed: erythema with partial thickness skin loss to buttocks/groin/perineum; B heels with developing black eschar  Drainage (amount, consistency, odor) see nursing flowsheet; heels appear dry  Periwound: Dressing procedure/placement/frequency:  Cleanse B heels with soap and water, dry and apply Xeroform gauze ([place] [phone]) to wound beds daily, cover with dry gauze and Kerlix roll gauze. Place B feet in Prevalon boots to offload pressure Soila [phone])  Cleanse buttocks/perineum with soap and water, dry and apply Nystatin  cream as ordered by primary MD.  POC discussed with bedside nurse. WOC team will not follow.    Patient would benefit from ongoing management of B heels if does not currently have by podiatry or wound care center.   Thank you,    Powell Bar MSN, RN-BC, [employer]

## 2024-05-03 NOTE — Plan of Care (Signed)

## 2024-05-03 NOTE — Progress Notes (Signed)
 AT 1700 cleansed and placed dressing to bilateral heels per wound care order, patient tolerated well. Prevalon boots applied.

## 2024-05-03 NOTE — Progress Notes (Signed)
 9167: Made Dr Courage aware, HR irregular, 100-133 at rest, patient is not on telemetry. Resting currently with no complaints. RN asked if MD would like an EKG. MD responded, no EKG or telemetry at this time, give cardizem  po as scheduled.

## 2024-05-03 NOTE — Progress Notes (Signed)
 PROGRESS NOTE  [name], is a 89 y.o. female, DOB - [birth year], FMW:[id number]  Admit date - 05/01/2024   Admitting Physician Posey Maier, DO  Outpatient Primary MD for the patient is [name], MD  LOS - 2  Chief Complaint  Patient presents with   Emesis    Brief Narrative:  89 y.o. female with medical history significant of HTN, HLD,  CKD stage IV, PAF on Coumadin , symptomatic bradycardia status post PPM admitted on 05/02/2023 with concerns for AKI in the setting of presumed UTI,   -Assessment and Plan: 1)AKI----acute kidney injury on CKD stage -IV---patient had soft BP and poor oral intake -Creatinine on admission= 3.26 , - baseline creatinine = 1.9 to 2.1   ,  -creatinine is now= 2.92 (trending down ) , -Okay to wean off of IV fluids and encourage adequate oral intake --Renally adjust medications, avoid nephrotoxic agents / dehydration  / hypotension  2) hyponatremia/dehydration--- continue IV fluids - Continue to encourage adequate oral intake  3) E. coli/Klebsiella/Enterococcus faecium UTI-- -urine culture from 05/01/2024 with E. coli, Klebsiella and Enterococcus faecium -Blood cultures NGTD continue IV Rocephin  , add amoxicillin  for Enterococcus -- WBC 11.4 >>9.3  4) chronic anemia of CKD--Hgb greater than 8 -Continue PTA iron supplements - No overt bleeding = Anticipate drop in Hgb with hydration  5)PAFIb-history of bradycardia-status post pacemaker placement -Hold Cardizem  and metoprolol  due to soft BP -pharmacy to manage Coumadin  therapy - INR supratherapeutic but trending down  6)Pressure Injury--see photos in epic -- Wound care consult appreciated  7) generalized weakness/deconditioning--- PT eval requested  8) hypothyroidism--- continue levothyroxine   9)Dementia--- stable, continue Aricept , BuSpar   10)GERD--- continue Protonix   Status is: Inpatient   Disposition: The patient is from: Home              Anticipated d/c is to: Home               Anticipated d/c date is: 2 days              Patient currently is not medically stable to d/c. Barriers: Not Clinically Stable-   Code Status :  -  Code Status: Limited: Do not attempt resuscitation (DNR) -DNR-LIMITED -Do Not Intubate/DNI    Family Communication:    (patient is alert, awake and coherent)   Discussed with daughter Mitzie, at bedside, granddaughter [name] and great granddaughter questions answered  DVT Prophylaxis  :   - SCDs   SCDs Start: 05/02/24 0354   Lab Results  Component Value Date   PLT 336 05/03/2024   Inpatient Medications  Scheduled Meds:  amoxicillin   500 mg Oral BID   busPIRone   7.5 mg Oral BID   diltiazem   240 mg Oral Daily   donepezil   10 mg Oral QHS   ferrous sulfate   325 mg Oral Q breakfast   levothyroxine   112 mcg Oral Q0600   nystatin    Topical TID   nystatin  cream   Topical BID   pantoprazole   40 mg Oral Daily   Continuous Infusions:  cefTRIAXone  (ROCEPHIN )  IV 1 g (05/02/24 2204)   PRN Meds:.acetaminophen  **OR** acetaminophen , prochlorperazine    Anti-infectives (From admission, onward)    Start     Dose/Rate Route Frequency Ordered Stop   05/03/24 1215  amoxicillin  (AMOXIL ) capsule 500 mg        500 mg Oral 2 times daily 05/03/24 1158     05/02/24 2200  cefTRIAXone  (ROCEPHIN ) 1 g in sodium chloride  0.9 % 100 mL  IVPB        1 g 200 mL/hr over 30 Minutes Intravenous Every 24 hours 05/02/24 0744     05/01/24 2215  cefTRIAXone  (ROCEPHIN ) 1 g in sodium chloride  0.9 % 100 mL IVPB        1 g 200 mL/hr over 30 Minutes Intravenous  Once 05/01/24 2207 05/01/24 2317      Subjective: [name] today has no fevers, no emesis,  No chest pain,    -Family at bedside, questions answered - Oral intake improving - Voiding well   Objective: Vitals:   05/03/24 1152 05/03/24 1358 05/03/24 1410 05/03/24 1800  BP: 105/61 98/66  126/67  Pulse: 90 (!) 121 100 89  Resp:  18    Temp: 97.8 F (36.6 C) 98.1 F (36.7 C)    TempSrc:  Oral Oral    SpO2:  95%    Weight:        Intake/Output Summary (Last 24 hours) at 05/03/2024 1829 Last data filed at 05/03/2024 0547 Gross per 24 hour  Intake 1319.49 ml  Output --  Net 1319.49 ml   Filed Weights   05/02/24 0025  Weight: 73.2 kg    Physical Exam  Gen:- Awake Alert,  in no apparent distress  HEENT:- [place].AT, No sclera icterus, poor vision Ears----HOH Neck-Supple Neck,No JVD,.  Lungs-  CTAB , fair symmetrical air movement CV- S1, S2 normal, regular  Abd-  +ve B.Sounds, Abd Soft, No tenderness,    Extremity/Skin:- No  edema, pedal pulses present  Psych-affect is appropriate, oriented x3 Neuro-no new focal deficits, no tremors  Data Reviewed: I have personally reviewed following labs and imaging studies  CBC: Recent Labs  Lab 05/01/24 1917 05/02/24 0434 05/03/24 0657  WBC 11.4* 9.2 9.3  NEUTROABS 8.6*  --   --   HGB 8.8* 8.3* 8.3*  HCT 27.1* 26.1* 27.4*  MCV 93.1 93.2 96.5  PLT 413* 358 336   Basic Metabolic Panel: Recent Labs  Lab 05/01/24 1917 05/02/24 0434 05/03/24 0657  NA 133* 134* 132*  K 4.5 4.4 4.0  CL 97* 99 97*  CO2 24 24 22   GLUCOSE 99 82 83  BUN 45* 43* 39*  CREATININE 3.26* 3.17* 2.92*  CALCIUM 9.0 8.6* 8.7*  MG 2.3 2.0  --   PHOS  --  3.0 2.9   GFR: Estimated Creatinine Clearance: 11.8 mL/min (A) (by C-G formula based on SCr of 2.92 mg/dL (H)). Liver Function Tests: Recent Labs  Lab 05/01/24 1917 05/02/24 0434 05/03/24 0657  AST 17 16  --   ALT 10 9  --   ALKPHOS 118 105  --   BILITOT 0.6 0.6  --   PROT 6.3* 5.7*  --   ALBUMIN  2.6* 2.3* 2.4*   Recent Results (from the past 240 hours)  Urine Culture     Status: Abnormal (Preliminary result)   Collection Time: 05/01/24  9:28 AM   Specimen: Urine, Clean Catch  Result Value Ref Range Status   Specimen Description   Final    URINE, CLEAN CATCH Performed at [facility], [address] [place], [place] [address]    Special Requests   Final    NONE Performed at  [facility], [address] [place], [place] [address]    Culture (A)  Final    >=100,000 COLONIES/mL KLEBSIELLA PNEUMONIAE 50,000 COLONIES/mL ESCHERICHIA COLI >=100,000 COLONIES/mL ENTEROCOCCUS FAECIUM SUSCEPTIBILITIES TO FOLLOW Performed at [facility], [address], [place], [place] [address]  Report Status PENDING  Incomplete  Resp panel by RT-PCR (RSV, Flu A&B, Covid) Anterior Nasal Swab     Status: None   Collection Time: 05/01/24  7:00 PM   Specimen: Anterior Nasal Swab  Result Value Ref Range Status   SARS Coronavirus 2 by RT PCR NEGATIVE NEGATIVE Final    Comment: (NOTE) SARS-CoV-2 target nucleic acids are NOT DETECTED.  The SARS-CoV-2 RNA is generally detectable in upper respiratory specimens during the acute phase of infection. The lowest concentration of SARS-CoV-2 viral copies this assay can detect is 138 copies/mL. A negative result does not preclude SARS-Cov-2 infection and should not be used as the sole basis for treatment or other patient management decisions. A negative result may occur with  improper specimen collection/handling, submission of specimen other than nasopharyngeal swab, presence of viral mutation(s) within the areas targeted by this assay, and inadequate number of viral copies(<138 copies/mL). A negative result must be combined with clinical observations, patient history, and epidemiological information. The expected result is Negative.  Fact Sheet for Patients:  BloggerCourse.com  Fact Sheet for Healthcare Providers:  SeriousBroker.it  This test is no t yet approved or cleared by the [place]  FDA and  has been authorized for detection and/or diagnosis of SARS-CoV-2 by FDA under an Emergency Use Authorization (EUA). This EUA will remain  in effect (meaning this test can be used) for the duration of the COVID-19 declaration under Section 564(b)(1) of the Act, 21 U.S.C.section  360bbb-3(b)(1), unless the authorization is terminated  or revoked sooner.       Influenza A by PCR NEGATIVE NEGATIVE Final   Influenza B by PCR NEGATIVE NEGATIVE Final    Comment: (NOTE) The Xpert Xpress SARS-CoV-2/FLU/RSV plus assay is intended as an aid in the diagnosis of influenza from Nasopharyngeal swab specimens and should not be used as a sole basis for treatment. Nasal washings and aspirates are unacceptable for Xpert Xpress SARS-CoV-2/FLU/RSV testing.  Fact Sheet for Patients: BloggerCourse.com  Fact Sheet for Healthcare Providers: SeriousBroker.it  This test is not yet approved or cleared by the [place]  FDA and has been authorized for detection and/or diagnosis of SARS-CoV-2 by FDA under an Emergency Use Authorization (EUA). This EUA will remain in effect (meaning this test can be used) for the duration of the COVID-19 declaration under Section 564(b)(1) of the Act, 21 U.S.C. section 360bbb-3(b)(1), unless the authorization is terminated or revoked.     Resp Syncytial Virus by PCR NEGATIVE NEGATIVE Final    Comment: (NOTE) Fact Sheet for Patients: BloggerCourse.com  Fact Sheet for Healthcare Providers: SeriousBroker.it  This test is not yet approved or cleared by the [place]  FDA and has been authorized for detection and/or diagnosis of SARS-CoV-2 by FDA under an Emergency Use Authorization (EUA). This EUA will remain in effect (meaning this test can be used) for the duration of the COVID-19 declaration under Section 564(b)(1) of the Act, 21 U.S.C. section 360bbb-3(b)(1), unless the authorization is terminated or revoked.  Performed at [facility], [address] [place], [place] [address]   Blood culture (routine x 2)     Status: None (Preliminary result)   Collection Time: 05/01/24  7:17 PM   Specimen: BLOOD  Result Value Ref Range Status    Specimen Description BLOOD LEFT ANTECUBITAL  Final   Special Requests   Final    BOTTLES DRAWN AEROBIC AND ANAEROBIC Blood Culture adequate volume   Culture   Final    NO GROWTH 2 DAYS Performed at [facility]  [facility], [address] [place], [place] [address]    Report Status PENDING  Incomplete  Blood culture (routine x 2)     Status: None (Preliminary result)   Collection Time: 05/01/24  7:19 PM   Specimen: BLOOD RIGHT FOREARM  Result Value Ref Range Status   Specimen Description BLOOD RIGHT FOREARM  Final   Special Requests   Final    BOTTLES DRAWN AEROBIC ONLY Blood Culture results may not be optimal due to an inadequate volume of blood received in culture bottles   Culture   Final    NO GROWTH 2 DAYS Performed at [facility], [address] [place], [place] [address]    Report Status PENDING  Incomplete    Scheduled Meds:  amoxicillin   500 mg Oral BID   busPIRone   7.5 mg Oral BID   diltiazem   240 mg Oral Daily   donepezil   10 mg Oral QHS   ferrous sulfate   325 mg Oral Q breakfast   levothyroxine   112 mcg Oral Q0600   nystatin    Topical TID   nystatin  cream   Topical BID   pantoprazole   40 mg Oral Daily   Continuous Infusions:  cefTRIAXone  (ROCEPHIN )  IV 1 g (05/02/24 2204)    LOS: 2 days   Rendall Carwin M.D on 05/03/2024 at 6:29 PM  Go to [email or web address] - for contact info  [facility] - Office  [phone]  If 7PM-7AM, please contact night-coverage [email or web address] 05/03/2024, 6:29 PM

## 2024-05-04 ENCOUNTER — Ambulatory Visit: Payer: Self-pay | Admitting: Internal Medicine

## 2024-05-04 DIAGNOSIS — R791 Abnormal coagulation profile: Secondary | ICD-10-CM | POA: Diagnosis not present

## 2024-05-04 DIAGNOSIS — E039 Hypothyroidism, unspecified: Secondary | ICD-10-CM | POA: Diagnosis not present

## 2024-05-04 DIAGNOSIS — I482 Chronic atrial fibrillation, unspecified: Secondary | ICD-10-CM | POA: Diagnosis not present

## 2024-05-04 DIAGNOSIS — N3000 Acute cystitis without hematuria: Secondary | ICD-10-CM | POA: Diagnosis not present

## 2024-05-04 LAB — URINE CULTURE: Culture: >=100000 — AB

## 2024-05-04 LAB — PROTIME-INR
INR: 4.3 (ref 0.8–1.2)
Prothrombin Time: 43 s — ABNORMAL HIGH (ref 11.4–15.2)

## 2024-05-04 MED ORDER — DARBEPOETIN ALFA 200 MCG/0.4ML IJ SOSY
200.0000 ug | PREFILLED_SYRINGE | Freq: Once | INTRAMUSCULAR | Status: AC
  Administered 2024-05-04: 200 ug via SUBCUTANEOUS
  Filled 2024-05-04: qty 0.4

## 2024-05-04 MED ORDER — ALUM & MAG HYDROXIDE-SIMETH 200-200-20 MG/5ML PO SUSP
15.0000 mL | Freq: Once | ORAL | Status: AC
  Administered 2024-05-04: 15 mL via ORAL
  Filled 2024-05-04: qty 30

## 2024-05-04 MED ORDER — FOSFOMYCIN TROMETHAMINE 3 G PO PACK
3.0000 g | PACK | ORAL | Status: DC
  Administered 2024-05-04: 3 g via ORAL
  Filled 2024-05-04: qty 3

## 2024-05-04 MED ORDER — METOPROLOL TARTRATE 25 MG PO TABS
12.5000 mg | ORAL_TABLET | Freq: Two times a day (BID) | ORAL | Status: DC
  Administered 2024-05-04 – 2024-05-05 (×2): 12.5 mg via ORAL
  Filled 2024-05-04 (×2): qty 1

## 2024-05-04 NOTE — Plan of Care (Signed)

## 2024-05-04 NOTE — Progress Notes (Addendum)
 PROGRESS NOTE  [name], is a 89 y.o. female, DOB - 04-Sep-1925, FMW:[id number]  Admit date - 05/01/2024   Admitting Physician Posey Maier, DO  Outpatient Primary MD for the patient is Hayley Rollene BRAVO, MD  LOS - 3  Chief Complaint  Patient presents with   Emesis    Brief Narrative:  89 y.o. female with medical history significant of HTN, HLD,  CKD stage IV, PAF on Coumadin , symptomatic bradycardia status post PPM admitted on 05/02/2023 with concerns for AKI in the setting of presumed UTI,   -Assessment and Plan: 1)AKI----acute kidney injury on CKD stage -IV---patient had soft BP and poor oral intake -Creatinine on admission= 3.26 , - baseline creatinine = 1.9 to 2.1   ,  -creatinine is now= 2.92 (trending down ) , - weaned off of IV fluids and encourage adequate oral intake --Renally adjust medications, avoid nephrotoxic agents / dehydration  / hypotension  2) hyponatremia/dehydration--- continue IV fluids - Continue to encourage adequate oral intake - Repeat renal panel in a.m.  3) E. coli/Klebsiella/Enterococcus faecium UTI-- -urine culture from 05/01/2024 with E. coli, Klebsiella and Enterococcus faecium (VRE) -Blood cultures NGTD Discussed with ID physician Dr. Alm Needle continue IV Rocephin  for Klebsiella and E. Coli --As per ID physician give fosfomycin 3 g x 2 doses first dose on 04/03/2024 and last dose on 05/07/2024 for vancomycin-resistant Enterococcus faecium -- WBC 11.4 >>9.3  4) chronic anemia of CKD--Hgb greater than 8 -Continue PTA iron supplements - No overt bleeding = Anticipate drop in Hgb with hydration  5)PAFIb-history of bradycardia-status post pacemaker placement -Restart long acting Cardizem  -Okay to give metoprolol  25 mg twice daily with hold parameters -pharmacy to manage Coumadin  therapy - INR supratherapeutic in the setting of recent Bactrim  therapy - INR continues to trend down, --Coumadin  remains on hold  6)Pressure Injury--see  photos in epic -- Wound care consult appreciated  7) generalized weakness/deconditioning--- we need home health PT at  8) hypothyroidism--- continue levothyroxine   9)Dementia--- stable, continue Aricept , BuSpar   10)GERD--- continue Protonix   Status is: Inpatient   Disposition: The patient is from: Home              Anticipated d/c is to: Home              Anticipated d/c date is: 1 day              Patient currently is not medically stable to d/c. Barriers: Not Clinically Stable-   Code Status :  -  Code Status: Limited: Do not attempt resuscitation (DNR) -DNR-LIMITED -Do Not Intubate/DNI    Family Communication:    (patient is alert, awake and coherent)   Discussed with daughter Mitzie, at bedside, granddaughter [name] and great granddaughter questions answered  DVT Prophylaxis  :   - SCDs   SCDs Start: 05/02/24 0354   Lab Results  Component Value Date   PLT 336 05/03/2024   Inpatient Medications  Scheduled Meds:  busPIRone   7.5 mg Oral BID   darbepoetin (ARANESP ) injection - DIALYSIS  200 mcg Subcutaneous Once   diltiazem   240 mg Oral Daily   donepezil   10 mg Oral QHS   ferrous sulfate   325 mg Oral Q breakfast   fosfomycin  3 g Oral Q72H   levothyroxine   112 mcg Oral Q0600   metoprolol  tartrate  12.5 mg Oral BID   nystatin    Topical TID   nystatin  cream   Topical BID   pantoprazole   40 mg Oral Daily  Continuous Infusions:  cefTRIAXone  (ROCEPHIN )  IV 1 g (05/03/24 2208)   PRN Meds:.acetaminophen  **OR** acetaminophen , prochlorperazine    Anti-infectives (From admission, onward)    Start     Dose/Rate Route Frequency Ordered Stop   05/04/24 1230  fosfomycin (MONUROL ) packet 3 g        3 g Oral every 72 hours 05/04/24 1136 05/10/24 1229   05/03/24 1215  amoxicillin  (AMOXIL ) capsule 500 mg  Status:  Discontinued        500 mg Oral 2 times daily 05/03/24 1158 05/04/24 1136   05/02/24 2200  cefTRIAXone  (ROCEPHIN ) 1 g in sodium chloride  0.9 % 100 mL IVPB         1 g 200 mL/hr over 30 Minutes Intravenous Every 24 hours 05/02/24 0744     05/01/24 2215  cefTRIAXone  (ROCEPHIN ) 1 g in sodium chloride  0.9 % 100 mL IVPB        1 g 200 mL/hr over 30 Minutes Intravenous  Once 05/01/24 2207 05/01/24 2317      Subjective: [name] today has no fevers, no emesis,  No chest pain,    - Daughter at bedside, questions answered - Oral intake is fair   Objective: Vitals:   05/03/24 1800 05/03/24 1955 05/04/24 0629 05/04/24 1300  BP: 126/67 121/60 123/82 109/82  Pulse: 89  100 (!) 103  Resp:  16 14 18   Temp:  98.7 F (37.1 C) 98.7 F (37.1 C) 98 F (36.7 C)  TempSrc:  Oral Oral Oral  SpO2:  98% 98% 95%  Weight:        Intake/Output Summary (Last 24 hours) at 05/04/2024 1443 Last data filed at 05/04/2024 0900 Gross per 24 hour  Intake 720 ml  Output --  Net 720 ml   Filed Weights   05/02/24 0025  Weight: 73.2 kg    Physical Exam  Gen:- Awake Alert,  in no apparent distress  HEENT:- [place].AT, No sclera icterus, poor vision Ears----HOH Neck-Supple Neck,No JVD,.  Lungs-  CTAB , fair symmetrical air movement CV- S1, S2 normal, regular  Abd-  +ve B.Sounds, Abd Soft, No tenderness, no CVA area tenderness Extremity/Skin:- No  edema, pedal pulses present  Psych-affect is appropriate, oriented x3 Neuro-no new focal deficits, no tremors  Data Reviewed: I have personally reviewed following labs and imaging studies  CBC: Recent Labs  Lab 05/01/24 1917 05/02/24 0434 05/03/24 0657  WBC 11.4* 9.2 9.3  NEUTROABS 8.6*  --   --   HGB 8.8* 8.3* 8.3*  HCT 27.1* 26.1* 27.4*  MCV 93.1 93.2 96.5  PLT 413* 358 336   Basic Metabolic Panel: Recent Labs  Lab 05/01/24 1917 05/02/24 0434 05/03/24 0657  NA 133* 134* 132*  K 4.5 4.4 4.0  CL 97* 99 97*  CO2 24 24 22   GLUCOSE 99 82 83  BUN 45* 43* 39*  CREATININE 3.26* 3.17* 2.92*  CALCIUM 9.0 8.6* 8.7*  MG 2.3 2.0  --   PHOS  --  3.0 2.9   GFR: Estimated Creatinine Clearance: 11.8 mL/min  (A) (by C-G formula based on SCr of 2.92 mg/dL (H)). Liver Function Tests: Recent Labs  Lab 05/01/24 1917 05/02/24 0434 05/03/24 0657  AST 17 16  --   ALT 10 9  --   ALKPHOS 118 105  --   BILITOT 0.6 0.6  --   PROT 6.3* 5.7*  --   ALBUMIN  2.6* 2.3* 2.4*   Recent Results (from the past 240 hours)  Urine Culture  Status: Abnormal   Collection Time: 05/01/24  9:28 AM   Specimen: Urine, Clean Catch  Result Value Ref Range Status   Specimen Description   Final    URINE, CLEAN CATCH Performed at [facility], [address], [place], [place] [address]    Special Requests   Final    NONE Performed at [facility], [address] [place], [place] [address]    Culture (A)  Final    >=100,000 COLONIES/mL KLEBSIELLA PNEUMONIAE 50,000 COLONIES/mL ESCHERICHIA COLI >=100,000 COLONIES/mL ENTEROCOCCUS FAECIUM VANCOMYCIN RESISTANT ENTEROCOCCUS    Report Status 05/04/2024 FINAL  Final   Organism ID, Bacteria KLEBSIELLA PNEUMONIAE (A)  Final   Organism ID, Bacteria ESCHERICHIA COLI (A)  Final   Organism ID, Bacteria ENTEROCOCCUS FAECIUM (A)  Final      Susceptibility   Escherichia coli - MIC*    AMPICILLIN  <=2 SENSITIVE Sensitive     CEFAZOLIN  (URINE) Value in next row Sensitive      <=1 SENSITIVEThis is a modified FDA-approved test that has been validated and its performance characteristics determined by the reporting laboratory.  This laboratory is certified under the Clinical Laboratory Improvement Amendments CLIA as qualified to perform high complexity clinical laboratory testing.    CEFEPIME Value in next row Sensitive      <=1 SENSITIVEThis is a modified FDA-approved test that has been validated and its performance characteristics determined by the reporting laboratory.  This laboratory is certified under the Clinical Laboratory Improvement Amendments CLIA as qualified to perform high complexity clinical laboratory testing.    ERTAPENEM Value in next row Sensitive      <=1  SENSITIVEThis is a modified FDA-approved test that has been validated and its performance characteristics determined by the reporting laboratory.  This laboratory is certified under the Clinical Laboratory Improvement Amendments CLIA as qualified to perform high complexity clinical laboratory testing.    CEFTRIAXONE  Value in next row Sensitive      <=1 SENSITIVEThis is a modified FDA-approved test that has been validated and its performance characteristics determined by the reporting laboratory.  This laboratory is certified under the Clinical Laboratory Improvement Amendments CLIA as qualified to perform high complexity clinical laboratory testing.    CIPROFLOXACIN  Value in next row Sensitive      <=1 SENSITIVEThis is a modified FDA-approved test that has been validated and its performance characteristics determined by the reporting laboratory.  This laboratory is certified under the Clinical Laboratory Improvement Amendments CLIA as qualified to perform high complexity clinical laboratory testing.    GENTAMICIN  Value in next row Sensitive      <=1 SENSITIVEThis is a modified FDA-approved test that has been validated and its performance characteristics determined by the reporting laboratory.  This laboratory is certified under the Clinical Laboratory Improvement Amendments CLIA as qualified to perform high complexity clinical laboratory testing.    NITROFURANTOIN  Value in next row Sensitive      <=1 SENSITIVEThis is a modified FDA-approved test that has been validated and its performance characteristics determined by the reporting laboratory.  This laboratory is certified under the Clinical Laboratory Improvement Amendments CLIA as qualified to perform high complexity clinical laboratory testing.    TRIMETH /SULFA  Value in next row Sensitive      <=1 SENSITIVEThis is a modified FDA-approved test that has been validated and its performance characteristics determined by the reporting laboratory.  This  laboratory is certified under the Clinical Laboratory Improvement Amendments CLIA as qualified to perform high complexity clinical laboratory testing.    AMPICILLIN GABINO  Value in next row Sensitive      <=1 SENSITIVEThis is a modified FDA-approved test that has been validated and its performance characteristics determined by the reporting laboratory.  This laboratory is certified under the Clinical Laboratory Improvement Amendments CLIA as qualified to perform high complexity clinical laboratory testing.    PIP/TAZO Value in next row Sensitive ug/mL     <=4 SENSITIVEThis is a modified FDA-approved test that has been validated and its performance characteristics determined by the reporting laboratory.  This laboratory is certified under the Clinical Laboratory Improvement Amendments CLIA as qualified to perform high complexity clinical laboratory testing.    MEROPENEM Value in next row Sensitive      <=4 SENSITIVEThis is a modified FDA-approved test that has been validated and its performance characteristics determined by the reporting laboratory.  This laboratory is certified under the Clinical Laboratory Improvement Amendments CLIA as qualified to perform high complexity clinical laboratory testing.    * 50,000 COLONIES/mL ESCHERICHIA COLI   Enterococcus faecium - MIC*    AMPICILLIN  Value in next row Resistant      <=4 SENSITIVEThis is a modified FDA-approved test that has been validated and its performance characteristics determined by the reporting laboratory.  This laboratory is certified under the Clinical Laboratory Improvement Amendments CLIA as qualified to perform high complexity clinical laboratory testing.    NITROFURANTOIN  Value in next row Intermediate      <=4 SENSITIVEThis is a modified FDA-approved test that has been validated and its performance characteristics determined by the reporting laboratory.  This laboratory is certified under the Clinical Laboratory Improvement Amendments  CLIA as qualified to perform high complexity clinical laboratory testing.    VANCOMYCIN Value in next row Resistant      <=4 SENSITIVEThis is a modified FDA-approved test that has been validated and its performance characteristics determined by the reporting laboratory.  This laboratory is certified under the Clinical Laboratory Improvement Amendments CLIA as qualified to perform high complexity clinical laboratory testing.    * >=100,000 COLONIES/mL ENTEROCOCCUS FAECIUM   Klebsiella pneumoniae - MIC*    AMPICILLIN  Value in next row Resistant      <=4 SENSITIVEThis is a modified FDA-approved test that has been validated and its performance characteristics determined by the reporting laboratory.  This laboratory is certified under the Clinical Laboratory Improvement Amendments CLIA as qualified to perform high complexity clinical laboratory testing.    CEFAZOLIN  (URINE) Value in next row Sensitive      2 SENSITIVEThis is a modified FDA-approved test that has been validated and its performance characteristics determined by the reporting laboratory.  This laboratory is certified under the Clinical Laboratory Improvement Amendments CLIA as qualified to perform high complexity clinical laboratory testing.    CEFEPIME Value in next row Sensitive      2 SENSITIVEThis is a modified FDA-approved test that has been validated and its performance characteristics determined by the reporting laboratory.  This laboratory is certified under the Clinical Laboratory Improvement Amendments CLIA as qualified to perform high complexity clinical laboratory testing.    ERTAPENEM Value in next row Sensitive      2 SENSITIVEThis is a modified FDA-approved test that has been validated and its performance characteristics determined by the reporting laboratory.  This laboratory is certified under the Clinical Laboratory Improvement Amendments CLIA as qualified to perform high complexity clinical laboratory testing.    CEFTRIAXONE   Value in next row Sensitive      2 SENSITIVEThis is a modified FDA-approved test that has  been validated and its performance characteristics determined by the reporting laboratory.  This laboratory is certified under the Clinical Laboratory Improvement Amendments CLIA as qualified to perform high complexity clinical laboratory testing.    CIPROFLOXACIN  Value in next row Sensitive      2 SENSITIVEThis is a modified FDA-approved test that has been validated and its performance characteristics determined by the reporting laboratory.  This laboratory is certified under the Clinical Laboratory Improvement Amendments CLIA as qualified to perform high complexity clinical laboratory testing.    GENTAMICIN  Value in next row Sensitive      2 SENSITIVEThis is a modified FDA-approved test that has been validated and its performance characteristics determined by the reporting laboratory.  This laboratory is certified under the Clinical Laboratory Improvement Amendments CLIA as qualified to perform high complexity clinical laboratory testing.    NITROFURANTOIN  Value in next row Intermediate      2 SENSITIVEThis is a modified FDA-approved test that has been validated and its performance characteristics determined by the reporting laboratory.  This laboratory is certified under the Clinical Laboratory Improvement Amendments CLIA as qualified to perform high complexity clinical laboratory testing.    TRIMETH /SULFA  Value in next row Sensitive      2 SENSITIVEThis is a modified FDA-approved test that has been validated and its performance characteristics determined by the reporting laboratory.  This laboratory is certified under the Clinical Laboratory Improvement Amendments CLIA as qualified to perform high complexity clinical laboratory testing.    AMPICILLIN /SULBACTAM Value in next row Sensitive      2 SENSITIVEThis is a modified FDA-approved test that has been validated and its performance characteristics determined by  the reporting laboratory.  This laboratory is certified under the Clinical Laboratory Improvement Amendments CLIA as qualified to perform high complexity clinical laboratory testing.    PIP/TAZO Value in next row Sensitive ug/mL     <=4 SENSITIVEThis is a modified FDA-approved test that has been validated and its performance characteristics determined by the reporting laboratory.  This laboratory is certified under the Clinical Laboratory Improvement Amendments CLIA as qualified to perform high complexity clinical laboratory testing.    MEROPENEM Value in next row Sensitive      <=4 SENSITIVEThis is a modified FDA-approved test that has been validated and its performance characteristics determined by the reporting laboratory.  This laboratory is certified under the Clinical Laboratory Improvement Amendments CLIA as qualified to perform high complexity clinical laboratory testing.    * >=100,000 COLONIES/mL KLEBSIELLA PNEUMONIAE  Resp panel by RT-PCR (RSV, Flu A&B, Covid) Anterior Nasal Swab     Status: None   Collection Time: 05/01/24  7:00 PM   Specimen: Anterior Nasal Swab  Result Value Ref Range Status   SARS Coronavirus 2 by RT PCR NEGATIVE NEGATIVE Final    Comment: (NOTE) SARS-CoV-2 target nucleic acids are NOT DETECTED.  The SARS-CoV-2 RNA is generally detectable in upper respiratory specimens during the acute phase of infection. The lowest concentration of SARS-CoV-2 viral copies this assay can detect is 138 copies/mL. A negative result does not preclude SARS-Cov-2 infection and should not be used as the sole basis for treatment or other patient management decisions. A negative result may occur with  improper specimen collection/handling, submission of specimen other than nasopharyngeal swab, presence of viral mutation(s) within the areas targeted by this assay, and inadequate number of viral copies(<138 copies/mL). A negative result must be combined with clinical observations,  patient history, and epidemiological information. The expected result is Negative.  Fact Sheet  for Patients:  BloggerCourse.com  Fact Sheet for Healthcare Providers:  SeriousBroker.it  This test is no t yet approved or cleared by the [place]  FDA and  has been authorized for detection and/or diagnosis of SARS-CoV-2 by FDA under an Emergency Use Authorization (EUA). This EUA will remain  in effect (meaning this test can be used) for the duration of the COVID-19 declaration under Section 564(b)(1) of the Act, 21 U.S.C.section 360bbb-3(b)(1), unless the authorization is terminated  or revoked sooner.       Influenza A by PCR NEGATIVE NEGATIVE Final   Influenza B by PCR NEGATIVE NEGATIVE Final    Comment: (NOTE) The Xpert Xpress SARS-CoV-2/FLU/RSV plus assay is intended as an aid in the diagnosis of influenza from Nasopharyngeal swab specimens and should not be used as a sole basis for treatment. Nasal washings and aspirates are unacceptable for Xpert Xpress SARS-CoV-2/FLU/RSV testing.  Fact Sheet for Patients: BloggerCourse.com  Fact Sheet for Healthcare Providers: SeriousBroker.it  This test is not yet approved or cleared by the [place]  FDA and has been authorized for detection and/or diagnosis of SARS-CoV-2 by FDA under an Emergency Use Authorization (EUA). This EUA will remain in effect (meaning this test can be used) for the duration of the COVID-19 declaration under Section 564(b)(1) of the Act, 21 U.S.C. section 360bbb-3(b)(1), unless the authorization is terminated or revoked.     Resp Syncytial Virus by PCR NEGATIVE NEGATIVE Final    Comment: (NOTE) Fact Sheet for Patients: BloggerCourse.com  Fact Sheet for Healthcare Providers: SeriousBroker.it  This test is not yet approved or cleared by the [place] FDA and has been authorized for detection and/or diagnosis of SARS-CoV-2 by FDA under an Emergency Use Authorization (EUA). This EUA will remain in effect (meaning this test can be used) for the duration of the COVID-19 declaration under Section 564(b)(1) of the Act, 21 U.S.C. section 360bbb-3(b)(1), unless the authorization is terminated or revoked.  Performed at [facility], [address] [place], [place] [address]   Blood culture (routine x 2)     Status: None (Preliminary result)   Collection Time: 05/01/24  7:17 PM   Specimen: BLOOD  Result Value Ref Range Status   Specimen Description BLOOD LEFT ANTECUBITAL  Final   Special Requests   Final    BOTTLES DRAWN AEROBIC AND ANAEROBIC Blood Culture adequate volume   Culture   Final    NO GROWTH 3 DAYS Performed at [facility], [address], [place], [place] [address]    Report Status PENDING  Incomplete  Blood culture (routine x 2)     Status: None (Preliminary result)   Collection Time: 05/01/24  7:19 PM   Specimen: BLOOD RIGHT FOREARM  Result Value Ref Range Status   Specimen Description BLOOD RIGHT FOREARM  Final   Special Requests   Final    BOTTLES DRAWN AEROBIC ONLY Blood Culture results may not be optimal due to an inadequate volume of blood received in culture bottles   Culture   Final    NO GROWTH 3 DAYS Performed at [facility], [address] [place], [place] [address]    Report Status PENDING  Incomplete    Scheduled Meds:  busPIRone   7.5 mg Oral BID   darbepoetin (ARANESP ) injection - DIALYSIS  200 mcg Subcutaneous Once   diltiazem   240 mg Oral Daily   donepezil   10 mg Oral QHS   ferrous sulfate   325 mg Oral Q breakfast   fosfomycin  3 g  Oral Q72H   levothyroxine   112 mcg Oral Q0600   metoprolol  tartrate  12.5 mg Oral BID   nystatin    Topical TID   nystatin  cream   Topical BID   pantoprazole   40 mg Oral Daily   Continuous Infusions:  cefTRIAXone  (ROCEPHIN )  IV 1 g (05/03/24 2208)    LOS: 3  days   Rendall Carwin M.D on 05/04/2024 at 2:43 PM  Go to [email or web address] - for contact info  [facility] - Office  [phone]  If 7PM-7AM, please contact night-coverage [email or web address] 05/04/2024, 2:43 PM

## 2024-05-04 NOTE — Progress Notes (Signed)
 PHARMACY - ANTICOAGULATION CONSULT NOTE  Pharmacy Consult for warfarin Indication: atrial fibrillation  No Known Allergies  Patient Measurements: Weight: 73.2 kg (161 lb 6 oz)  Vital Signs: Temp: 98.7 F (37.1 C) (09/07 0629) Temp Source: Oral (09/07 0629) BP: 123/82 (09/07 0629) Pulse Rate: 100 (09/07 0629)  Labs: Recent Labs    05/01/24 1415 05/01/24 1917 05/01/24 1917 05/01/24 2117 05/02/24 0434 05/03/24 0657 05/04/24 0353  HGB  --  8.8*   < >  --  8.3* 8.3*  --   HCT  --  27.1*  --   --  26.1* 27.4*  --   PLT  --  413*  --   --  358 336  --   LABPROT  --   --   --   --   --  51.9* 43.0*  INR 8.0*  --   --   --   --  5.5* 4.3*  CREATININE  --  3.26*  --   --  3.17* 2.92*  --   TROPONINIHS  --  8  --  8  --   --   --    < > = values in this interval not displayed.    Estimated Creatinine Clearance: 11.8 mL/min (A) (by C-G formula based on SCr of 2.92 mg/dL (H)).   Medical History: Past Medical History:  Diagnosis Date   Allergy    Annual physical exam 04/22/2015   Anxiety    Arthritis    Arthritis    Asthma    Atrial fibrillation (HCC)    Bleeding disorder (HCC)    CHF (congestive heart failure) (HCC)    CKD (chronic kidney disease) stage 4, GFR 15-29 ml/min (HCC) 09/08/2021   COPD (chronic obstructive pulmonary disease) (HCC)    Depression    Gastroesophageal reflux disease    Hearing impairment    Heart disease    Heart murmur    High cholesterol    Hyperlipidemia    Hypertension    Hypothyroidism    Impaired glucose tolerance    Macular degeneration    Oxygen  deficiency    Pancreatitis    Paroxysmal atrial fibrillation (HCC) 2011   Sick sinus syndrome (HCC) 2011   Atrial fibrilation 10/2009; and dual chamber pacemaker in 4/11; normal EF   Sleep apnea    Nocturnal oxygen  therapy   Zoster 2016    Medications:  Medications Prior to Admission  Medication Sig Dispense Refill Last Dose/Taking   busPIRone  (BUSPAR ) 7.5 MG tablet TAKE (1)  TABLET BY MOUTH TWICE DAILY. 60 tablet 2 05/01/2024   diltiazem  (CARDIZEM  CD) 240 MG 24 hr capsule Take 1 capsule (240 mg total) by mouth daily. 90 capsule 1 05/01/2024   donepezil  (ARICEPT ) 10 MG tablet Take 1 tablet (10 mg total) by mouth at bedtime. 90 tablet 0 Past Week   ferrous sulfate  325 (65 FE) MG tablet Take 325 mg by mouth daily with breakfast.   05/01/2024   levothyroxine  (SYNTHROID ) 112 MCG tablet Take 1 tablet (112 mcg total) by mouth daily. 90 tablet 0 05/01/2024   omeprazole  (PRILOSEC) 40 MG capsule Take 1 capsule (40 mg total) by mouth daily. 90 capsule 0 05/01/2024   ursodiol  (ACTIGALL ) 300 MG capsule Take 1 capsule (300 mg total) by mouth 3 (three) times daily. 90 capsule 2 05/01/2024    Assessment: 89 y.o. female with PMH as listed below who presents with [facility] from home, presented with emesis. On chronic anticoagulation for afib with coumadin . INR >  8. No s/s of bleeding.   INR 8> 5.5> 4.3, supratherapeutic but trending down  Goal of Therapy:  INR 2-3 Monitor platelets by anticoagulation protocol: Yes   Plan:  No coumadin  today Daily PT/INR Monitor for S/S of bleeding  Cherlyn Boers, BS Pharm D, BCPS Clinical Pharmacist 05/04/2024,7:50 AM

## 2024-05-05 ENCOUNTER — Telehealth: Payer: Self-pay

## 2024-05-05 DIAGNOSIS — I482 Chronic atrial fibrillation, unspecified: Secondary | ICD-10-CM | POA: Diagnosis not present

## 2024-05-05 DIAGNOSIS — N179 Acute kidney failure, unspecified: Secondary | ICD-10-CM | POA: Diagnosis not present

## 2024-05-05 DIAGNOSIS — N3000 Acute cystitis without hematuria: Secondary | ICD-10-CM | POA: Diagnosis not present

## 2024-05-05 DIAGNOSIS — R791 Abnormal coagulation profile: Secondary | ICD-10-CM | POA: Diagnosis not present

## 2024-05-05 LAB — RENAL FUNCTION PANEL
Albumin: 2.2 g/dL — ABNORMAL LOW (ref 3.5–5.0)
Anion gap: 6 (ref 5–15)
BUN: 35 mg/dL — ABNORMAL HIGH (ref 8–23)
CO2: 20 mmol/L — ABNORMAL LOW (ref 22–32)
Calcium: 8.3 mg/dL — ABNORMAL LOW (ref 8.9–10.3)
Chloride: 104 mmol/L (ref 98–111)
Creatinine, Ser: 2.6 mg/dL — ABNORMAL HIGH (ref 0.44–1.00)
GFR, Estimated: 16 mL/min — ABNORMAL LOW (ref >60)
Glucose, Bld: 84 mg/dL (ref 70–99)
Phosphorus: 2.3 mg/dL — ABNORMAL LOW (ref 2.5–4.6)
Potassium: 3.9 mmol/L (ref 3.5–5.1)
Sodium: 130 mmol/L — ABNORMAL LOW (ref 135–145)

## 2024-05-05 LAB — HEMOGLOBIN AND HEMATOCRIT, BLOOD
HCT: 26.1 % — ABNORMAL LOW (ref 36.0–46.0)
Hemoglobin: 8.5 g/dL — ABNORMAL LOW (ref 12.0–15.0)

## 2024-05-05 LAB — PROTIME-INR
INR: 3.2 — ABNORMAL HIGH (ref 0.8–1.2)
Prothrombin Time: 34.2 s — ABNORMAL HIGH (ref 11.4–15.2)

## 2024-05-05 MED ORDER — FOSFOMYCIN TROMETHAMINE 3 G PO PACK: 3.0000 g | PACK | Freq: Once | ORAL | 0 refills | Status: AC

## 2024-05-05 MED ORDER — NYSTATIN 100000 UNIT/GM EX CREA: TOPICAL_CREAM | Freq: Two times a day (BID) | CUTANEOUS | 2 refills | Status: DC

## 2024-05-05 MED ORDER — DILTIAZEM HCL ER COATED BEADS 240 MG PO CP24: 240.0000 mg | ORAL_CAPSULE | Freq: Every day | ORAL | 3 refills | Status: DC

## 2024-05-05 MED ORDER — ACETAMINOPHEN 325 MG PO TABS: 650.0000 mg | ORAL_TABLET | Freq: Four times a day (QID) | ORAL | Status: DC | PRN

## 2024-05-05 MED ORDER — NYSTATIN 100000 UNIT/GM EX POWD: Freq: Three times a day (TID) | CUTANEOUS | 3 refills | Status: DC

## 2024-05-05 MED ORDER — FERROUS SULFATE 325 (65 FE) MG PO TABS: 325.0000 mg | ORAL_TABLET | ORAL | 3 refills | Status: DC

## 2024-05-05 MED ORDER — DONEPEZIL HCL 10 MG PO TABS: 10.0000 mg | ORAL_TABLET | Freq: Every day | ORAL | 3 refills | Status: DC

## 2024-05-05 MED ORDER — LEVOTHYROXINE SODIUM 112 MCG PO TABS: 112.0000 ug | ORAL_TABLET | Freq: Every day | ORAL | 3 refills | Status: DC

## 2024-05-05 MED ORDER — BUSPIRONE HCL 7.5 MG PO TABS: 7.5000 mg | ORAL_TABLET | Freq: Two times a day (BID) | ORAL | 2 refills | Status: DC

## 2024-05-05 MED ORDER — OMEPRAZOLE 40 MG PO CPDR: 40.0000 mg | DELAYED_RELEASE_CAPSULE | Freq: Every day | ORAL | 3 refills | Status: DC

## 2024-05-05 MED ORDER — CEPHALEXIN 250 MG PO CAPS: 250.0000 mg | ORAL_CAPSULE | Freq: Two times a day (BID) | ORAL | 0 refills | Status: AC

## 2024-05-05 MED ORDER — METOPROLOL TARTRATE 25 MG PO TABS: 25.0000 mg | ORAL_TABLET | Freq: Two times a day (BID) | ORAL | 5 refills | Status: DC

## 2024-05-05 NOTE — Progress Notes (Signed)
 PHARMACY - ANTICOAGULATION CONSULT NOTE  Pharmacy Consult for warfarin Indication: atrial fibrillation  No Known Allergies  Patient Measurements: Weight: 73.2 kg (161 lb 6 oz)  Vital Signs: Temp: 98.6 F (37 C) (09/08 0526) Temp Source: Oral (09/08 0526) BP: 113/73 (09/08 0526) Pulse Rate: 80 (09/08 0526)  Labs: Recent Labs    05/03/24 0657 05/04/24 0353 05/05/24 0414  HGB 8.3*  --  8.5*  HCT 27.4*  --  26.1*  PLT 336  --   --   LABPROT 51.9* 43.0* 34.2*  INR 5.5* 4.3* 3.2*  CREATININE 2.92*  --  2.60*    Estimated Creatinine Clearance: 13.2 mL/min (A) (by C-G formula based on SCr of 2.6 mg/dL (H)).   Medical History: Past Medical History:  Diagnosis Date   Allergy    Annual physical exam 04/22/2015   Anxiety    Arthritis    Arthritis    Asthma    Atrial fibrillation (HCC)    Bleeding disorder (HCC)    CHF (congestive heart failure) (HCC)    CKD (chronic kidney disease) stage 4, GFR 15-29 ml/min (HCC) 09/08/2021   COPD (chronic obstructive pulmonary disease) (HCC)    Depression    Gastroesophageal reflux disease    Hearing impairment    Heart disease    Heart murmur    High cholesterol    Hyperlipidemia    Hypertension    Hypothyroidism    Impaired glucose tolerance    Macular degeneration    Oxygen  deficiency    Pancreatitis    Paroxysmal atrial fibrillation (HCC) 2011   Sick sinus syndrome (HCC) 2011   Atrial fibrilation 10/2009; and dual chamber pacemaker in 4/11; normal EF   Sleep apnea    Nocturnal oxygen  therapy   Zoster 2016    Medications:  Medications Prior to Admission  Medication Sig Dispense Refill Last Dose/Taking   busPIRone  (BUSPAR ) 7.5 MG tablet TAKE (1) TABLET BY MOUTH TWICE DAILY. 60 tablet 2 05/01/2024   diltiazem  (CARDIZEM  CD) 240 MG 24 hr capsule Take 1 capsule (240 mg total) by mouth daily. 90 capsule 1 05/01/2024   donepezil  (ARICEPT ) 10 MG tablet Take 1 tablet (10 mg total) by mouth at bedtime. 90 tablet 0 Past Week    ferrous sulfate  325 (65 FE) MG tablet Take 325 mg by mouth daily with breakfast.   05/01/2024   levothyroxine  (SYNTHROID ) 112 MCG tablet Take 1 tablet (112 mcg total) by mouth daily. 90 tablet 0 05/01/2024   omeprazole  (PRILOSEC) 40 MG capsule Take 1 capsule (40 mg total) by mouth daily. 90 capsule 0 05/01/2024   ursodiol  (ACTIGALL ) 300 MG capsule Take 1 capsule (300 mg total) by mouth 3 (three) times daily. 90 capsule 2 05/01/2024    Assessment: 89 y.o. female with PMH as listed below who presents with [facility] from home, presented with emesis. On chronic anticoagulation for afib with coumadin . INR > 8. No s/s of bleeding.   INR 8> 5.5> 4.3> 3.2, supratherapeutic, Continues to trend down  Goal of Therapy:  INR 2-3 Monitor platelets by anticoagulation protocol: Yes   Plan:  No coumadin  today Daily PT/INR Monitor for S/S of bleeding  Cherlyn Boers, BS Pharm D, BCPS Clinical Pharmacist 05/05/2024,7:57 AM

## 2024-05-05 NOTE — Discharge Summary (Signed)
 [name], is a 89 y.o. female  DOB [birth year]  MRN [id number].  Admission date:  05/01/2024  Admitting Physician  Hayley Maier, DO  Discharge Date:  05/05/2024   Primary MD  Hayley Rollene BRAVO, MD  Recommendations for primary care physician for things to follow:   1)Wound Care--Heel Wounds---Cleanse B heels with soap and water, dry and apply Xeroform gauze  to wound beds daily, cover with dry gauze and Kerlix roll gauze. Place B feet in Prevalon boots to offload pressure   2)Referral to [facility] services have been made for you  3)Stop Metoprolol /Lopressor  100 mg tablets, use metoprolol /Lopressor  25 mg tablets twice daily--- hold metoprolol  if systolic blood pressure less than 100 mmhg for   4)Stop Torsemide /Demadex --- please Stop this diuretic/fluid medicine to avoid dehydration and persistent low sodium levels  5)Continue Cardizem /Cardizem  CD 240 mg daily for Heart Rate Control  6)Repeat  CBC and BMP Blood Tests in 1 week--, please talk to your hematologist [name] or your nephrologist about possibly getting Procrit/Aranesp  injections once or twice a month to help keep your hemoglobin/blood count up  7)Avoid Bactrim /Septra  (sulfamethoxazole  and trimethoprim ) antibiotic in the future  Admission Diagnosis  UTI (urinary tract infection) [N39.0] Acute cystitis with hematuria [N30.01] AKI (acute kidney injury) (HCC) [N17.9] Nausea and vomiting, unspecified vomiting type [R11.2]   Discharge Diagnosis  UTI (urinary tract infection) [N39.0] Acute cystitis with hematuria [N30.01] AKI (acute kidney injury) (HCC) [N17.9] Nausea and vomiting, unspecified vomiting type [R11.2]    Principal Problem:   UTI (urinary tract infection) Active Problems:   Acquired hypothyroidism   Mixed hyperlipidemia   GERD (gastroesophageal reflux disease)   Generalized weakness   Hypotension   Acute  kidney injury superimposed on stage 5 chronic kidney disease, not on chronic dialysis (HCC)   Vomiting   Elevated lipase   Atrial fibrillation, chronic (HCC)   Supratherapeutic INR      Past Medical History:  Diagnosis Date   Allergy    Annual physical exam 04/22/2015   Anxiety    Arthritis    Arthritis    Asthma    Atrial fibrillation (HCC)    Bleeding disorder (HCC)    CHF (congestive heart failure) (HCC)    CKD (chronic kidney disease) stage 4, GFR 15-29 ml/min (HCC) 09/08/2021   COPD (chronic obstructive pulmonary disease) (HCC)    Depression    Gastroesophageal reflux disease    Hearing impairment    Heart disease    Heart murmur    High cholesterol    Hyperlipidemia    Hypertension    Hypothyroidism    Impaired glucose tolerance    Macular degeneration    Oxygen  deficiency    Pancreatitis    Paroxysmal atrial fibrillation (HCC) 2011   Sick sinus syndrome (HCC) 2011   Atrial fibrilation 10/2009; and dual chamber pacemaker in 4/11; normal EF   Sleep apnea    Nocturnal oxygen  therapy   Zoster 2016    Past Surgical History:  Procedure Laterality Date   ABDOMINAL  HYSTERECTOMY     CATARACT EXTRACTION     Bilateral   COLONOSCOPY  08/29/2007   Dr. Shaaron   EYE SURGERY     laser    PACEMAKER INSERTION  08/28/2009   dual chamber    PPM GENERATOR CHANGEOUT N/A 11/13/2016   Procedure: PPM Generator Changeout;  Surgeon: Hayley Danelle ORN, MD;  Location: [facility];  Service: Cardiovascular;  Laterality: N/A;   SALPINGOOPHORECTOMY  08/28/1968   right       HPI  from the history and physical done on the day of admission:    HPI: [name] is a 89 y.o. female with medical history significant of hypertension, hyperlipidemia, CKD stage IV, PAF on Coumadin , symptomatic bradycardia status post PPM who presents to the emergency department from home via EMS due to an episode of vomiting which occurred about 15 to 20 minutes after taking medication this afternoon  (around 3 PM).  She also had low blood pressure with SBP of 85, she was thought to be dehydrated by daughter, so, EMS was activated. She was recently admitted from 7/30 through 8/7 due to concern for cholangitis and AKI superimposed on CKD 4.   ED Course:  In the emergency department, BP was 98/68, other vital signs were within normal range.  Workup in the ED showed WBC 11.4, hemoglobin 8.8, hematocrit 27.1, MCV 93.1, platelets 413.  Lipase 131.  BMP shows sodium 133, potassium 3.5, chloride 97, bicarb 24, blood glucose 99, BUN 45, creatinine 3.26 (baseline creatinine 2.0-2.2), albumin  2.6.  Urinalysis was suspicious for UTI, troponin x 2 was flat at 8, magnesium 2.3.  Influenza A, B, SARS coronavirus 2, RSV was negative. She was treated with IV ceftriaxone , IV hydration was provided. TRH was asked to admit patient   Review of Systems: Review of systems as noted in the HPI. All other systems reviewed and are negative.     Hospital Course:   Brief Narrative:  89 y.o. female with medical history significant of HTN, HLD,  CKD stage IV, PAF on Coumadin , symptomatic bradycardia status post PPM admitted on 05/02/2023 with concerns for AKI in the setting of presumed UTI,   -Assessment and Plan: 1)AKI----acute kidney injury on CKD stage -IV---patient had soft BP and poor oral intake -Creatinine on admission= 3.26 , - baseline creatinine = 1.9 to 2.1   ,  -creatinine is now= 2.60 (still trending down ) , --Renally adjust medications, avoid nephrotoxic agents / dehydration  / hypotension --Repeat BMP as outpatient   2)Hyponatremia/Dehydration--- - Continue to encourage adequate oral intake - Required IV fluids initially -Should continue to improve with adequate oral intake, repeat BMP as outpatient as advised   3) E. coli/Klebsiella/Enterococcus faecium UTI-- -urine culture from 05/01/2024 with E. coli, Klebsiella and Enterococcus faecium (VRE) -Blood cultures NGTD Discussed with ID physician Dr.  Alm Underwood Treated with IV Rocephin  for Klebsiella and E. Coli, dc on keflex  --Initially treated with amoxicillin  for Enterococcus, as per ID physician give fosfomycin 3 g x 2 doses first dose on 04/03/2024 and last dose on 05/07/2024 for vancomycin-resistant Enterococcus faecium -- WBC 11.4 >>9.3   4) chronic anemia of CKD--Hgb greater than 8 -Continue PTA iron supplements - No overt bleeding    5)PAFIb-history of bradycardia-status post pacemaker placement -Restart long acting Cardizem  -Okay to give metoprolol  25 mg twice daily with hold parameters - INR supratherapeutic in the setting of recent Bactrim  therapy - INR continues to trend down, -- -repeat INR as outpatient and  Coumadin  adjustments as outlined in discharge instructions   6)Pressure Injury--see photos in epic -- Wound care consult appreciated   7) generalized weakness/deconditioning---  home health PT    8) hypothyroidism--- continue levothyroxine    9)Dementia--- stable, continue Aricept , BuSpar    10)GERD--- continue Protonix    Disposition: The patient is from: Home              Anticipated d/c is to: Home  Discharge Condition: stable  Follow UP   Follow-up Information     Underwood Siad, MD. Schedule an appointment as soon as possible for a visit.   Specialty: Oncology Contact information: [address] [place] [address] [phone]                  Consults obtained - ID  Diet and Activity recommendation:  As advised  Discharge Instructions   Discharge Instructions     Call MD for:  difficulty breathing, headache or visual disturbances   Complete by: As directed    Call MD for:  persistant dizziness or light-headedness   Complete by: As directed    Call MD for:  persistant nausea and vomiting   Complete by: As directed    Call MD for:  temperature >100.4   Complete by: As directed    Consult to palliative care   Complete by: As directed    Patient wants [facility] at home   Diet general   Complete by: As directed    Discharge instructions   Complete by: As directed    1)Wound Care--Heel Wounds---Cleanse B heels with soap and water, dry and apply Xeroform gauze  to wound beds daily, cover with dry gauze and Kerlix roll gauze. Place B feet in Prevalon boots to offload pressure   2)Referral to [facility] services have been made for you  3)Stop Metoprolol /Lopressor  100 mg tablets, use metoprolol /Lopressor  25 mg tablets twice daily--- hold metoprolol  if systolic blood pressure less than 100 mmhg for   4)Stop Torsemide /Demadex --- please Stop this diuretic/fluid medicine to avoid dehydration and persistent low sodium levels  5)Continue Cardizem /Cardizem  CD 240 mg daily for Heart Rate Control  6)Repeat  CBC and BMP Blood Tests in 1 week--, please talk to your hematologist [name] or your nephrologist about possibly getting Procrit/Aranesp  injections once or twice a month to help keep your hemoglobin/blood count up  7)Avoid Bactrim /Septra  (sulfamethoxazole  and trimethoprim ) antibiotic in the future   Discharge wound care:   Complete by: As directed    Heel Wounds---Cleanse B heels with soap and water, dry and apply Xeroform gauze  to wound beds daily, cover with dry gauze and Kerlix roll gauze. Place B feet in Prevalon boots to offload pressure   Increase activity slowly   Complete by: As directed          Discharge Medications     Allergies as of 05/05/2024   No Known Allergies      Medication List     TAKE these medications    acetaminophen  325 MG tablet Commonly known as: TYLENOL  Take 2 tablets (650 mg total) by mouth every 6 (six) hours as needed for mild pain (pain score 1-3) or fever (or Fever >/= 101).   busPIRone  7.5 MG tablet Commonly known as: BUSPAR  Take 1 tablet (7.5 mg total) by mouth 2 (two) times daily. What changed: See the new instructions.   cephALEXin  250 MG capsule Commonly known as:  KEFLEX  Take 1 capsule (250 mg total) by mouth  2 (two) times daily for 4 days.   diltiazem  240 MG 24 hr capsule Commonly known as: CARDIZEM  CD Take 1 capsule (240 mg total) by mouth daily.   donepezil  10 MG tablet Commonly known as: ARICEPT  Take 1 tablet (10 mg total) by mouth at bedtime.   ferrous sulfate  325 (65 FE) MG tablet Take 1 tablet (325 mg total) by mouth every Tuesday, Thursday, Saturday, and Sunday. Start taking on: May 06, 2024 What changed: when to take this   fosfomycin 3 g Pack Commonly known as: MONUROL  Take 3 g by mouth once for 1 dose. Take 1 dose on Wednesday 05/07/24 for UTI Start taking on: May 07, 2024   levothyroxine  112 MCG tablet Commonly known as: SYNTHROID  Take 1 tablet (112 mcg total) by mouth daily before breakfast. What changed: when to take this   metoprolol  tartrate 25 MG tablet Commonly known as: LOPRESSOR  Take 1 tablet (25 mg total) by mouth 2 (two) times daily. Hold if SBP < 100 mmhg   nystatin  cream Commonly known as: MYCOSTATIN  Apply topically 2 (two) times daily. Apply to affected areas twice daily   nystatin  powder Commonly known as: MYCOSTATIN /NYSTOP  Apply topically 3 (three) times daily.   omeprazole  40 MG capsule Commonly known as: PRILOSEC Take 1 capsule (40 mg total) by mouth daily.   ursodiol  300 MG capsule Commonly known as: ACTIGALL  Take 1 capsule (300 mg total) by mouth 3 (three) times daily.               Discharge Care Instructions  (From admission, onward)           Start     Ordered   05/05/24 0000  Discharge wound care:       Comments: Heel Wounds---Cleanse B heels with soap and water, dry and apply Xeroform gauze  to wound beds daily, cover with dry gauze and Kerlix roll gauze. Place B feet in Prevalon boots to offload pressure   05/05/24 1337            Major procedures and Radiology Reports - PLEASE review detailed and final reports for all details, in brief -   CUP PACEART  REMOTE DEVICE CHECK Result Date: 05/01/2024 Pacemaker: Scheduled remote reviewed. Normal device function.  Presenting rhythm: VP Next remote 91 days. ML, CVRS   Micro Results  Recent Results (from the past 240 hours)  Urine Culture     Status: Abnormal   Collection Time: 05/01/24  9:28 AM   Specimen: Urine, Clean Catch  Result Value Ref Range Status   Specimen Description   Final    URINE, CLEAN CATCH Performed at [facility], [address] [place], [place] [address]    Special Requests   Final    NONE Performed at [facility], [address] [place], [place] [address]    Culture (A)  Final    >=100,000 COLONIES/mL KLEBSIELLA PNEUMONIAE 50,000 COLONIES/mL ESCHERICHIA COLI >=100,000 COLONIES/mL ENTEROCOCCUS FAECIUM VANCOMYCIN RESISTANT ENTEROCOCCUS    Report Status 05/04/2024 FINAL  Final   Organism ID, Bacteria KLEBSIELLA PNEUMONIAE (A)  Final   Organism ID, Bacteria ESCHERICHIA COLI (A)  Final   Organism ID, Bacteria ENTEROCOCCUS FAECIUM (A)  Final      Susceptibility   Escherichia coli - MIC*    AMPICILLIN  <=2 SENSITIVE Sensitive     CEFAZOLIN  (URINE) Value in next row Sensitive      <=1 SENSITIVEThis is a modified FDA-approved test that has been validated and its performance characteristics determined by  the reporting laboratory.  This laboratory is certified under the Clinical Laboratory Improvement Amendments CLIA as qualified to perform high complexity clinical laboratory testing.    CEFEPIME Value in next row Sensitive      <=1 SENSITIVEThis is a modified FDA-approved test that has been validated and its performance characteristics determined by the reporting laboratory.  This laboratory is certified under the Clinical Laboratory Improvement Amendments CLIA as qualified to perform high complexity clinical laboratory testing.    ERTAPENEM Value in next row Sensitive      <=1 SENSITIVEThis is a modified FDA-approved test that has been validated and its performance  characteristics determined by the reporting laboratory.  This laboratory is certified under the Clinical Laboratory Improvement Amendments CLIA as qualified to perform high complexity clinical laboratory testing.    CEFTRIAXONE  Value in next row Sensitive      <=1 SENSITIVEThis is a modified FDA-approved test that has been validated and its performance characteristics determined by the reporting laboratory.  This laboratory is certified under the Clinical Laboratory Improvement Amendments CLIA as qualified to perform high complexity clinical laboratory testing.    CIPROFLOXACIN  Value in next row Sensitive      <=1 SENSITIVEThis is a modified FDA-approved test that has been validated and its performance characteristics determined by the reporting laboratory.  This laboratory is certified under the Clinical Laboratory Improvement Amendments CLIA as qualified to perform high complexity clinical laboratory testing.    GENTAMICIN  Value in next row Sensitive      <=1 SENSITIVEThis is a modified FDA-approved test that has been validated and its performance characteristics determined by the reporting laboratory.  This laboratory is certified under the Clinical Laboratory Improvement Amendments CLIA as qualified to perform high complexity clinical laboratory testing.    NITROFURANTOIN  Value in next row Sensitive      <=1 SENSITIVEThis is a modified FDA-approved test that has been validated and its performance characteristics determined by the reporting laboratory.  This laboratory is certified under the Clinical Laboratory Improvement Amendments CLIA as qualified to perform high complexity clinical laboratory testing.    TRIMETH /SULFA  Value in next row Sensitive      <=1 SENSITIVEThis is a modified FDA-approved test that has been validated and its performance characteristics determined by the reporting laboratory.  This laboratory is certified under the Clinical Laboratory Improvement Amendments CLIA as qualified  to perform high complexity clinical laboratory testing.    AMPICILLIN /SULBACTAM Value in next row Sensitive      <=1 SENSITIVEThis is a modified FDA-approved test that has been validated and its performance characteristics determined by the reporting laboratory.  This laboratory is certified under the Clinical Laboratory Improvement Amendments CLIA as qualified to perform high complexity clinical laboratory testing.    PIP/TAZO Value in next row Sensitive ug/mL     <=4 SENSITIVEThis is a modified FDA-approved test that has been validated and its performance characteristics determined by the reporting laboratory.  This laboratory is certified under the Clinical Laboratory Improvement Amendments CLIA as qualified to perform high complexity clinical laboratory testing.    MEROPENEM Value in next row Sensitive      <=4 SENSITIVEThis is a modified FDA-approved test that has been validated and its performance characteristics determined by the reporting laboratory.  This laboratory is certified under the Clinical Laboratory Improvement Amendments CLIA as qualified to perform high complexity clinical laboratory testing.    * 50,000 COLONIES/mL ESCHERICHIA COLI   Enterococcus faecium - MIC*    AMPICILLIN  Value in next row Resistant      <=  4 SENSITIVEThis is a modified FDA-approved test that has been validated and its performance characteristics determined by the reporting laboratory.  This laboratory is certified under the Clinical Laboratory Improvement Amendments CLIA as qualified to perform high complexity clinical laboratory testing.    NITROFURANTOIN  Value in next row Intermediate      <=4 SENSITIVEThis is a modified FDA-approved test that has been validated and its performance characteristics determined by the reporting laboratory.  This laboratory is certified under the Clinical Laboratory Improvement Amendments CLIA as qualified to perform high complexity clinical laboratory testing.    VANCOMYCIN Value  in next row Resistant      <=4 SENSITIVEThis is a modified FDA-approved test that has been validated and its performance characteristics determined by the reporting laboratory.  This laboratory is certified under the Clinical Laboratory Improvement Amendments CLIA as qualified to perform high complexity clinical laboratory testing.    * >=100,000 COLONIES/mL ENTEROCOCCUS FAECIUM   Klebsiella pneumoniae - MIC*    AMPICILLIN  Value in next row Resistant      <=4 SENSITIVEThis is a modified FDA-approved test that has been validated and its performance characteristics determined by the reporting laboratory.  This laboratory is certified under the Clinical Laboratory Improvement Amendments CLIA as qualified to perform high complexity clinical laboratory testing.    CEFAZOLIN  (URINE) Value in next row Sensitive      2 SENSITIVEThis is a modified FDA-approved test that has been validated and its performance characteristics determined by the reporting laboratory.  This laboratory is certified under the Clinical Laboratory Improvement Amendments CLIA as qualified to perform high complexity clinical laboratory testing.    CEFEPIME Value in next row Sensitive      2 SENSITIVEThis is a modified FDA-approved test that has been validated and its performance characteristics determined by the reporting laboratory.  This laboratory is certified under the Clinical Laboratory Improvement Amendments CLIA as qualified to perform high complexity clinical laboratory testing.    ERTAPENEM Value in next row Sensitive      2 SENSITIVEThis is a modified FDA-approved test that has been validated and its performance characteristics determined by the reporting laboratory.  This laboratory is certified under the Clinical Laboratory Improvement Amendments CLIA as qualified to perform high complexity clinical laboratory testing.    CEFTRIAXONE  Value in next row Sensitive      2 SENSITIVEThis is a modified FDA-approved test that has been  validated and its performance characteristics determined by the reporting laboratory.  This laboratory is certified under the Clinical Laboratory Improvement Amendments CLIA as qualified to perform high complexity clinical laboratory testing.    CIPROFLOXACIN  Value in next row Sensitive      2 SENSITIVEThis is a modified FDA-approved test that has been validated and its performance characteristics determined by the reporting laboratory.  This laboratory is certified under the Clinical Laboratory Improvement Amendments CLIA as qualified to perform high complexity clinical laboratory testing.    GENTAMICIN  Value in next row Sensitive      2 SENSITIVEThis is a modified FDA-approved test that has been validated and its performance characteristics determined by the reporting laboratory.  This laboratory is certified under the Clinical Laboratory Improvement Amendments CLIA as qualified to perform high complexity clinical laboratory testing.    NITROFURANTOIN  Value in next row Intermediate      2 SENSITIVEThis is a modified FDA-approved test that has been validated and its performance characteristics determined by the reporting laboratory.  This laboratory is certified under the Clinical Laboratory Improvement Amendments CLIA as  qualified to perform high complexity clinical laboratory testing.    TRIMETH /SULFA  Value in next row Sensitive      2 SENSITIVEThis is a modified FDA-approved test that has been validated and its performance characteristics determined by the reporting laboratory.  This laboratory is certified under the Clinical Laboratory Improvement Amendments CLIA as qualified to perform high complexity clinical laboratory testing.    AMPICILLIN /SULBACTAM Value in next row Sensitive      2 SENSITIVEThis is a modified FDA-approved test that has been validated and its performance characteristics determined by the reporting laboratory.  This laboratory is certified under the Clinical Laboratory  Improvement Amendments CLIA as qualified to perform high complexity clinical laboratory testing.    PIP/TAZO Value in next row Sensitive ug/mL     <=4 SENSITIVEThis is a modified FDA-approved test that has been validated and its performance characteristics determined by the reporting laboratory.  This laboratory is certified under the Clinical Laboratory Improvement Amendments CLIA as qualified to perform high complexity clinical laboratory testing.    MEROPENEM Value in next row Sensitive      <=4 SENSITIVEThis is a modified FDA-approved test that has been validated and its performance characteristics determined by the reporting laboratory.  This laboratory is certified under the Clinical Laboratory Improvement Amendments CLIA as qualified to perform high complexity clinical laboratory testing.    * >=100,000 COLONIES/mL KLEBSIELLA PNEUMONIAE  Resp panel by RT-PCR (RSV, Flu A&B, Covid) Anterior Nasal Swab     Status: None   Collection Time: 05/01/24  7:00 PM   Specimen: Anterior Nasal Swab  Result Value Ref Range Status   SARS Coronavirus 2 by RT PCR NEGATIVE NEGATIVE Final    Comment: (NOTE) SARS-CoV-2 target nucleic acids are NOT DETECTED.  The SARS-CoV-2 RNA is generally detectable in upper respiratory specimens during the acute phase of infection. The lowest concentration of SARS-CoV-2 viral copies this assay can detect is 138 copies/mL. A negative result does not preclude SARS-Cov-2 infection and should not be used as the sole basis for treatment or other patient management decisions. A negative result may occur with  improper specimen collection/handling, submission of specimen other than nasopharyngeal swab, presence of viral mutation(s) within the areas targeted by this assay, and inadequate number of viral copies(<138 copies/mL). A negative result must be combined with clinical observations, patient history, and epidemiological information. The expected result is Negative.  Fact  Sheet for Patients:  BloggerCourse.com  Fact Sheet for Healthcare Providers:  SeriousBroker.it  This test is no t yet approved or cleared by the [place]  FDA and  has been authorized for detection and/or diagnosis of SARS-CoV-2 by FDA under an Emergency Use Authorization (EUA). This EUA will remain  in effect (meaning this test can be used) for the duration of the COVID-19 declaration under Section 564(b)(1) of the Act, 21 U.S.C.section 360bbb-3(b)(1), unless the authorization is terminated  or revoked sooner.       Influenza A by PCR NEGATIVE NEGATIVE Final   Influenza B by PCR NEGATIVE NEGATIVE Final    Comment: (NOTE) The Xpert Xpress SARS-CoV-2/FLU/RSV plus assay is intended as an aid in the diagnosis of influenza from Nasopharyngeal swab specimens and should not be used as a sole basis for treatment. Nasal washings and aspirates are unacceptable for Xpert Xpress SARS-CoV-2/FLU/RSV testing.  Fact Sheet for Patients: BloggerCourse.com  Fact Sheet for Healthcare Providers: SeriousBroker.it  This test is not yet approved or cleared by the [place]  FDA and has been authorized for detection and/or diagnosis of  SARS-CoV-2 by FDA under an Emergency Use Authorization (EUA). This EUA will remain in effect (meaning this test can be used) for the duration of the COVID-19 declaration under Section 564(b)(1) of the Act, 21 U.S.C. section 360bbb-3(b)(1), unless the authorization is terminated or revoked.     Resp Syncytial Virus by PCR NEGATIVE NEGATIVE Final    Comment: (NOTE) Fact Sheet for Patients: BloggerCourse.com  Fact Sheet for Healthcare Providers: SeriousBroker.it  This test is not yet approved or cleared by the [place]  FDA and has been authorized for detection and/or diagnosis of SARS-CoV-2 by FDA under an  Emergency Use Authorization (EUA). This EUA will remain in effect (meaning this test can be used) for the duration of the COVID-19 declaration under Section 564(b)(1) of the Act, 21 U.S.C. section 360bbb-3(b)(1), unless the authorization is terminated or revoked.  Performed at [facility], [address], [place], [place] [address]   Blood culture (routine x 2)     Status: None (Preliminary result)   Collection Time: 05/01/24  7:17 PM   Specimen: BLOOD  Result Value Ref Range Status   Specimen Description BLOOD LEFT ANTECUBITAL  Final   Special Requests   Final    BOTTLES DRAWN AEROBIC AND ANAEROBIC Blood Culture adequate volume   Culture   Final    NO GROWTH 4 DAYS Performed at [facility], [address], [place], [place] [address]    Report Status PENDING  Incomplete  Blood culture (routine x 2)     Status: None (Preliminary result)   Collection Time: 05/01/24  7:19 PM   Specimen: BLOOD RIGHT FOREARM  Result Value Ref Range Status   Specimen Description BLOOD RIGHT FOREARM  Final   Special Requests   Final    BOTTLES DRAWN AEROBIC ONLY Blood Culture results may not be optimal due to an inadequate volume of blood received in culture bottles   Culture   Final    NO GROWTH 4 DAYS Performed at [facility], [address], [place], [place] [address]    Report Status PENDING  Incomplete    Today   Subjective    [name] today has no new concerns - Patient's daughter and great granddaughter at bedside, questions answered No fever  Or chills  No Nausea, Vomiting or Diarrhea   Patient has been seen and examined prior to discharge   Objective   Blood pressure (!) 113/59, pulse 96, temperature 98.6 F (37 C), temperature source Oral, resp. rate 16, weight 73.2 kg, SpO2 96%.   Intake/Output Summary (Last 24 hours) at 05/05/2024 1432 Last data filed at 05/04/2024 2203 Gross per 24 hour  Intake 240 ml  Output --  Net 240 ml    Exam Gen:- Awake Alert, no acute  distress  HEENT:- .AT, No sclera icterus, poor vision Ears----HOH Neck-Supple Neck,No JVD,.  Lungs-  CTAB , good air movement bilaterally CV- S1, S2 normal, regular Abd-  +ve B.Sounds, Abd Soft, No tenderness, no CVA area tenderness Extremity/Skin:- No  edema,   good pulses, skin wounds Epic photos Psych-affect is appropriate, oriented x3 Neuro-no new focal deficits, no tremors    Data Review   CBC w Diff:  Lab Results  Component Value Date   WBC 9.3 05/03/2024   HGB 8.5 (L) 05/05/2024   HGB 10.7 (L) 11/27/2023   HCT 26.1 (L) 05/05/2024   HCT 33.4 (L) 11/27/2023   PLT 336 05/03/2024   PLT 295 11/27/2023   LYMPHOPCT 16 05/01/2024   BANDSPCT 0 11/30/2009  MONOPCT 7 05/01/2024   EOSPCT 1 05/01/2024   BASOPCT 1 05/01/2024    CMP:  Lab Results  Component Value Date   NA 130 (L) 05/05/2024   NA 140 11/27/2023   K 3.9 05/05/2024   CL 104 05/05/2024   CO2 20 (L) 05/05/2024   BUN 35 (H) 05/05/2024   BUN 26 11/27/2023   CREATININE 2.60 (H) 05/05/2024   CREATININE 1.15 (H) 03/12/2020   PROT 5.7 (L) 05/02/2024   PROT 7.0 11/27/2023   ALBUMIN  2.2 (L) 05/05/2024   ALBUMIN  4.1 11/27/2023   BILITOT 0.6 05/02/2024   BILITOT 0.4 11/27/2023   ALKPHOS 105 05/02/2024   AST 16 05/02/2024   ALT 9 05/02/2024  .  Total Discharge time is about 33 minutes  Rendall Carwin M.D on 05/05/2024 at 2:32 PM  Go to [email or web address] -  for contact info  [facility] - Office  [phone]

## 2024-05-05 NOTE — Telephone Encounter (Signed)
 Copied from CRM [id number]. Topic: General - Other >> May 02, 2024  3:23 PM Fonda T wrote: Reason for CRM: Received all from Kit, Acupuncturist, with [facility], [phone]  Calling on behalf of patient, to report that patient did not have occupational therapy this week, but will resume occupational therapy on nest week, week of September 7.  Can be reached if need to discuss further.

## 2024-05-05 NOTE — Care Management Important Message (Signed)
 Important Message  Patient Details  Name: [name] MRN: [id number] Date of Birth: Dec 13, 1925   Important Message Given:  Yes - Medicare IM     Mahli Glahn L Yasheka Fossett 05/05/2024, 10:55 AM

## 2024-05-05 NOTE — Discharge Instructions (Signed)
 1)Wound Care--Heel Wounds---Cleanse B heels with soap and water, dry and apply Xeroform gauze  to wound beds daily, cover with dry gauze and Kerlix roll gauze. Place B feet in Prevalon boots to offload pressure   2)Referral to [facility] Palliative Care services have been made for you  3)Stop Metoprolol /Lopressor  100 mg tablets, use metoprolol /Lopressor  25 mg tablets twice daily--- hold metoprolol  if systolic blood pressure less than 100 mmhg for   4)Stop Torsemide /Demadex --- please Stop this diuretic/fluid medicine to avoid dehydration and persistent low sodium levels  5)Continue Cardizem /Cardizem  CD 240 mg daily for Heart Rate Control  6)Repeat  CBC and BMP Blood Tests in 1 week--, please talk to your hematologist Dr. Davonna or your nephrologist about possibly getting Procrit/Aranesp  injections once or twice a month to help keep your hemoglobin/blood count up  7)Avoid Bactrim /Septra  (sulfamethoxazole  and trimethoprim ) antibiotic in the future

## 2024-05-06 ENCOUNTER — Telehealth: Payer: Self-pay

## 2024-05-06 ENCOUNTER — Telehealth: Payer: Self-pay | Admitting: Cardiology

## 2024-05-06 LAB — CULTURE, BLOOD (ROUTINE X 2)
Culture: NO GROWTH
Culture: NO GROWTH
Special Requests: ADEQUATE

## 2024-05-06 NOTE — Telephone Encounter (Signed)
 Copied from CRM [id number]. Topic: Clinical - Home Health Verbal Orders >> May 05, 2024  4:12 PM Everette C wrote: Caller/Agency: Jay IVER Donn Mitch Number: [phone] Any new concerns about the patient? No  Sydnie with [facility] has called to report a missed INR check for the patient due to the patient being currently hospitalized.

## 2024-05-06 NOTE — Telephone Encounter (Signed)
 Pt daughter wants to confirm that Dr. Alvan is aware of all medication changes that were made during pt hospital stay. Please advise.

## 2024-05-06 NOTE — Telephone Encounter (Signed)
 Called Sidney LPN [facility] to follow up on INR not reported yesterday.  She states patient was in hospital yesterday.  INR was 3.2 then and she held warfarin last night.  HH Nurse will be going out today or tomorrow for resumption of care.  I gave order for INR to be rechecked at that time.  She verbalized understanding and was notifying pt/daughter.

## 2024-05-06 NOTE — Progress Notes (Signed)
 Remote PPM Transmission

## 2024-05-06 NOTE — TOC Transition Note (Signed)
 Transition of Care [facility]) - Discharge Note   Patient Details  Name: [name] MRN: [id number] Date of Birth: [birth year]  [facility] CM/SW Contact:  Nena LITTIE Coffee, RN Phone Number: 05/06/2024, 1:25 PM  Clinical Narrative:    Pt dc'd home c/family support and [facility]HR[facility], PT, OT. At dc a secure chat was received to set up home palliative care. Referral sent and accepted by [facility], spoke c/Kwante.  Final next level of care: Home w Home Health Services Barriers to Discharge: Barriers Resolved  Patient Goals and CMS Choice Patient states their goals for this hospitalization and ongoing recovery are:: Return home c/family   Discharge Placement   Discharge Plan and Services Additional resources added to the After Visit Summary for   In-house Referral: Clinical Social Work Discharge Planning Services: CM Consult Post Acute Care Choice: Home Health            HH Arranged: RN, PT, OT [facility] Agency: [facility]   Social Drivers of Health (SDOH) Interventions  Readmission Risk Interventions    05/02/2024    1:10 PM 05/02/2024   11:14 AM 04/01/2024   11:40 AM  Readmission Risk Prevention Plan  Transportation Screening Complete Complete Complete  PCP or Specialist Appt within 5-7 Days   Complete  PCP or Specialist Appt within 3-5 Days Complete Complete   Home Care Screening   Complete  Medication Review (RN CM)   Complete  HRI or Home Care Consult Complete Complete   Social Work Consult for Recovery Care Planning/Counseling Complete Complete   Palliative Care Screening Not Applicable Complete   Medication Review Oceanographer) Complete Complete

## 2024-05-07 ENCOUNTER — Ambulatory Visit (INDEPENDENT_AMBULATORY_CARE_PROVIDER_SITE_OTHER): Admitting: *Deleted

## 2024-05-07 ENCOUNTER — Other Ambulatory Visit (HOSPITAL_COMMUNITY): Payer: Self-pay

## 2024-05-07 ENCOUNTER — Telehealth (HOSPITAL_COMMUNITY): Payer: Self-pay | Admitting: Pharmacy Technician

## 2024-05-07 DIAGNOSIS — E785 Hyperlipidemia, unspecified: Secondary | ICD-10-CM | POA: Diagnosis not present

## 2024-05-07 DIAGNOSIS — I482 Chronic atrial fibrillation, unspecified: Secondary | ICD-10-CM | POA: Diagnosis not present

## 2024-05-07 DIAGNOSIS — N179 Acute kidney failure, unspecified: Secondary | ICD-10-CM | POA: Diagnosis not present

## 2024-05-07 DIAGNOSIS — B952 Enterococcus as the cause of diseases classified elsewhere: Secondary | ICD-10-CM | POA: Diagnosis not present

## 2024-05-07 DIAGNOSIS — M6281 Muscle weakness (generalized): Secondary | ICD-10-CM | POA: Diagnosis not present

## 2024-05-07 DIAGNOSIS — D631 Anemia in chronic kidney disease: Secondary | ICD-10-CM | POA: Diagnosis not present

## 2024-05-07 DIAGNOSIS — N184 Chronic kidney disease, stage 4 (severe): Secondary | ICD-10-CM | POA: Diagnosis not present

## 2024-05-07 DIAGNOSIS — H3530 Unspecified macular degeneration: Secondary | ICD-10-CM | POA: Diagnosis not present

## 2024-05-07 DIAGNOSIS — N390 Urinary tract infection, site not specified: Secondary | ICD-10-CM | POA: Diagnosis not present

## 2024-05-07 DIAGNOSIS — I480 Paroxysmal atrial fibrillation: Secondary | ICD-10-CM | POA: Diagnosis not present

## 2024-05-07 DIAGNOSIS — Z5181 Encounter for therapeutic drug level monitoring: Secondary | ICD-10-CM | POA: Diagnosis not present

## 2024-05-07 DIAGNOSIS — J449 Chronic obstructive pulmonary disease, unspecified: Secondary | ICD-10-CM | POA: Diagnosis not present

## 2024-05-07 DIAGNOSIS — K219 Gastro-esophageal reflux disease without esophagitis: Secondary | ICD-10-CM | POA: Diagnosis not present

## 2024-05-07 DIAGNOSIS — I129 Hypertensive chronic kidney disease with stage 1 through stage 4 chronic kidney disease, or unspecified chronic kidney disease: Secondary | ICD-10-CM | POA: Diagnosis not present

## 2024-05-07 DIAGNOSIS — M1990 Unspecified osteoarthritis, unspecified site: Secondary | ICD-10-CM | POA: Diagnosis not present

## 2024-05-07 DIAGNOSIS — L89616 Pressure-induced deep tissue damage of right heel: Secondary | ICD-10-CM | POA: Diagnosis not present

## 2024-05-07 DIAGNOSIS — E871 Hypo-osmolality and hyponatremia: Secondary | ICD-10-CM | POA: Diagnosis not present

## 2024-05-07 DIAGNOSIS — E039 Hypothyroidism, unspecified: Secondary | ICD-10-CM | POA: Diagnosis not present

## 2024-05-07 DIAGNOSIS — L89626 Pressure-induced deep tissue damage of left heel: Secondary | ICD-10-CM | POA: Diagnosis not present

## 2024-05-07 DIAGNOSIS — G4730 Sleep apnea, unspecified: Secondary | ICD-10-CM | POA: Diagnosis not present

## 2024-05-07 LAB — POCT INR: INR: 3.8 — AB (ref 2.0–3.0)

## 2024-05-07 NOTE — Telephone Encounter (Signed)
 [name] with [facility] is following up to report INR.

## 2024-05-07 NOTE — Telephone Encounter (Signed)
 Pharmacy Patient Advocate Encounter  Received notification from [facility] that Prior Authorization for Fosfomycin Tromethamine  3GM packets  has been APPROVED from 05/07/2024 to 08/27/2024   PA #/Case ID/Reference #: [id number]

## 2024-05-07 NOTE — Progress Notes (Signed)
 INR 3.8 Please see anticoagulation encounter

## 2024-05-07 NOTE — Telephone Encounter (Signed)
 Noted

## 2024-05-07 NOTE — Patient Instructions (Signed)
 Spoke with Reena Roys LPN [facility].  INR 3.8 Hold warfarin tonight then decrease dose to 1/2 tablet (2.5mg ) daily. Recheck on 05/12/24

## 2024-05-07 NOTE — Telephone Encounter (Signed)
 noted

## 2024-05-07 NOTE — Telephone Encounter (Signed)
 Pharmacy Patient Advocate Encounter   Received notification from Inpatient Request that prior authorization for Fosfomycin Tromethamine  3GM packets  is required/requested.   Insurance verification completed.   The patient is insured through [employer] .   Per test claim: PA required; PA submitted to above mentioned insurance via Latent Key/confirmation #/EOC [id number] Status is pending

## 2024-05-08 ENCOUNTER — Telehealth: Payer: Self-pay | Admitting: Cardiology

## 2024-05-08 NOTE — Telephone Encounter (Signed)
 Daughter called to discuss discharge instructions with regards to her medications. She verbalized  understanding all all medications changes and also mentioned she was able to get a hospital bed for her.

## 2024-05-08 NOTE — Telephone Encounter (Signed)
 Pt daughter to speak with nurse about pt hospital visit. She asked to go over medications. Please advise.

## 2024-05-08 NOTE — Telephone Encounter (Signed)
 Discharge instructions   Complete by: As directed       1)Wound Care--Heel Wounds---Cleanse B heels with soap and water, dry and apply Xeroform gauze  to wound beds daily, cover with dry gauze and Kerlix roll gauze. Place B feet in Prevalon boots to offload pressure    2)Referral to [facility] Palliative Care services have been made for you   3)Stop Metoprolol /Lopressor  100 mg tablets, use metoprolol /Lopressor  25 mg tablets twice daily--- hold metoprolol  if systolic blood pressure less than 100 mmhg for    4)Stop Torsemide /Demadex --- please Stop this diuretic/fluid medicine to avoid dehydration and persistent low sodium levels   5)Continue Cardizem /Cardizem  CD 240 mg daily for Heart Rate Control   6)Repeat  CBC and BMP Blood Tests in 1 week--, please talk to your hematologist Dr. Davonna or your nephrologist about possibly getting Procrit/Aranesp  injections once or twice a month to help keep your hemoglobin/blood count up   7)Avoid Bactrim /Septra  (sulfamethoxazole  and trimethoprim ) antibiotic in the future    Discharge wound care:   Complete by: As directed      Heel Wounds---Cleanse B heels with soap and water, dry and apply Xeroform gauze  to wound beds daily, cover with dry gauze and Kerlix roll gauze. Place B feet in Prevalon boots to offload pressure    Increase activity slowly   Complete by: As directed

## 2024-05-12 ENCOUNTER — Ambulatory Visit (INDEPENDENT_AMBULATORY_CARE_PROVIDER_SITE_OTHER): Admitting: Gastroenterology

## 2024-05-12 ENCOUNTER — Telehealth: Payer: Self-pay

## 2024-05-12 NOTE — Telephone Encounter (Signed)
 Copied from CRM #[id number]. Topic: Clinical - Medical Advice >> May 12, 2024  8:19 AM Montie POUR wrote: Reason for CRM:  Dorina is calling to see if Dr. Antonetta will follow patient with Hospice. Please call Kwante at [phone] Ext. [phone]. She has a Primary school teacher.

## 2024-05-12 NOTE — Telephone Encounter (Signed)
 Informed.

## 2024-05-14 ENCOUNTER — Ambulatory Visit (INDEPENDENT_AMBULATORY_CARE_PROVIDER_SITE_OTHER): Admitting: *Deleted

## 2024-05-14 DIAGNOSIS — L89626 Pressure-induced deep tissue damage of left heel: Secondary | ICD-10-CM | POA: Diagnosis not present

## 2024-05-14 DIAGNOSIS — M1990 Unspecified osteoarthritis, unspecified site: Secondary | ICD-10-CM | POA: Diagnosis not present

## 2024-05-14 DIAGNOSIS — K219 Gastro-esophageal reflux disease without esophagitis: Secondary | ICD-10-CM | POA: Diagnosis not present

## 2024-05-14 DIAGNOSIS — I480 Paroxysmal atrial fibrillation: Secondary | ICD-10-CM | POA: Diagnosis not present

## 2024-05-14 DIAGNOSIS — N184 Chronic kidney disease, stage 4 (severe): Secondary | ICD-10-CM | POA: Diagnosis not present

## 2024-05-14 DIAGNOSIS — M6281 Muscle weakness (generalized): Secondary | ICD-10-CM | POA: Diagnosis not present

## 2024-05-14 DIAGNOSIS — I129 Hypertensive chronic kidney disease with stage 1 through stage 4 chronic kidney disease, or unspecified chronic kidney disease: Secondary | ICD-10-CM | POA: Diagnosis not present

## 2024-05-14 DIAGNOSIS — H3530 Unspecified macular degeneration: Secondary | ICD-10-CM | POA: Diagnosis not present

## 2024-05-14 DIAGNOSIS — Z5181 Encounter for therapeutic drug level monitoring: Secondary | ICD-10-CM | POA: Diagnosis not present

## 2024-05-14 DIAGNOSIS — E871 Hypo-osmolality and hyponatremia: Secondary | ICD-10-CM | POA: Diagnosis not present

## 2024-05-14 DIAGNOSIS — L89616 Pressure-induced deep tissue damage of right heel: Secondary | ICD-10-CM | POA: Diagnosis not present

## 2024-05-14 DIAGNOSIS — I4891 Unspecified atrial fibrillation: Secondary | ICD-10-CM

## 2024-05-14 DIAGNOSIS — D631 Anemia in chronic kidney disease: Secondary | ICD-10-CM | POA: Diagnosis not present

## 2024-05-14 DIAGNOSIS — G4730 Sleep apnea, unspecified: Secondary | ICD-10-CM | POA: Diagnosis not present

## 2024-05-14 DIAGNOSIS — N179 Acute kidney failure, unspecified: Secondary | ICD-10-CM | POA: Diagnosis not present

## 2024-05-14 DIAGNOSIS — B952 Enterococcus as the cause of diseases classified elsewhere: Secondary | ICD-10-CM | POA: Diagnosis not present

## 2024-05-14 DIAGNOSIS — E785 Hyperlipidemia, unspecified: Secondary | ICD-10-CM | POA: Diagnosis not present

## 2024-05-14 DIAGNOSIS — N390 Urinary tract infection, site not specified: Secondary | ICD-10-CM | POA: Diagnosis not present

## 2024-05-14 DIAGNOSIS — E039 Hypothyroidism, unspecified: Secondary | ICD-10-CM | POA: Diagnosis not present

## 2024-05-14 DIAGNOSIS — J449 Chronic obstructive pulmonary disease, unspecified: Secondary | ICD-10-CM | POA: Diagnosis not present

## 2024-05-14 LAB — POCT INR: INR: 2.9 (ref 2.0–3.0)

## 2024-05-14 NOTE — Patient Instructions (Signed)
 Spoke with Donna LPN [facility].  Orders given. Continue warfarin 1/2 tablet (2.5mg ) daily. Recheck on 05/21/24

## 2024-05-14 NOTE — Progress Notes (Signed)
 INR 2.9; Please see anticoagulation encounter

## 2024-05-19 ENCOUNTER — Telehealth: Payer: Self-pay

## 2024-05-19 ENCOUNTER — Ambulatory Visit: Payer: Self-pay

## 2024-05-19 ENCOUNTER — Inpatient Hospital Stay

## 2024-05-19 ENCOUNTER — Inpatient Hospital Stay: Admitting: Physician Assistant

## 2024-05-19 DIAGNOSIS — K219 Gastro-esophageal reflux disease without esophagitis: Secondary | ICD-10-CM | POA: Diagnosis not present

## 2024-05-19 DIAGNOSIS — E039 Hypothyroidism, unspecified: Secondary | ICD-10-CM | POA: Diagnosis not present

## 2024-05-19 DIAGNOSIS — E871 Hypo-osmolality and hyponatremia: Secondary | ICD-10-CM | POA: Diagnosis not present

## 2024-05-19 DIAGNOSIS — D631 Anemia in chronic kidney disease: Secondary | ICD-10-CM | POA: Diagnosis not present

## 2024-05-19 DIAGNOSIS — I129 Hypertensive chronic kidney disease with stage 1 through stage 4 chronic kidney disease, or unspecified chronic kidney disease: Secondary | ICD-10-CM | POA: Diagnosis not present

## 2024-05-19 DIAGNOSIS — N390 Urinary tract infection, site not specified: Secondary | ICD-10-CM | POA: Diagnosis not present

## 2024-05-19 DIAGNOSIS — J449 Chronic obstructive pulmonary disease, unspecified: Secondary | ICD-10-CM | POA: Diagnosis not present

## 2024-05-19 DIAGNOSIS — M1990 Unspecified osteoarthritis, unspecified site: Secondary | ICD-10-CM | POA: Diagnosis not present

## 2024-05-19 DIAGNOSIS — N179 Acute kidney failure, unspecified: Secondary | ICD-10-CM | POA: Diagnosis not present

## 2024-05-19 DIAGNOSIS — B952 Enterococcus as the cause of diseases classified elsewhere: Secondary | ICD-10-CM | POA: Diagnosis not present

## 2024-05-19 DIAGNOSIS — H3530 Unspecified macular degeneration: Secondary | ICD-10-CM | POA: Diagnosis not present

## 2024-05-19 DIAGNOSIS — L89616 Pressure-induced deep tissue damage of right heel: Secondary | ICD-10-CM | POA: Diagnosis not present

## 2024-05-19 DIAGNOSIS — I480 Paroxysmal atrial fibrillation: Secondary | ICD-10-CM | POA: Diagnosis not present

## 2024-05-19 DIAGNOSIS — E785 Hyperlipidemia, unspecified: Secondary | ICD-10-CM | POA: Diagnosis not present

## 2024-05-19 DIAGNOSIS — M6281 Muscle weakness (generalized): Secondary | ICD-10-CM | POA: Diagnosis not present

## 2024-05-19 DIAGNOSIS — L89626 Pressure-induced deep tissue damage of left heel: Secondary | ICD-10-CM | POA: Diagnosis not present

## 2024-05-19 DIAGNOSIS — G4730 Sleep apnea, unspecified: Secondary | ICD-10-CM | POA: Diagnosis not present

## 2024-05-19 DIAGNOSIS — N184 Chronic kidney disease, stage 4 (severe): Secondary | ICD-10-CM | POA: Diagnosis not present

## 2024-05-19 NOTE — Telephone Encounter (Signed)
 Reason for Disposition . Urinating more frequently than usual (i.e., frequency) OR new-onset of the feeling of an urgent need to urinate (i.e., urgency)  Answer Assessment - Initial Assessment Questions Daughter Mitzie called to update pt  who has chronic UTIs.Pt has had E Coli in urine. Pt took home test and tested positive on Saturday. Mitzie is asking if she can come by tomorrow morning and grab urine sample cup for urine test.  Please advise. No appt made at this time.     1. SYMPTOM: What's the main symptom you're concerned about? (e.g., frequency, incontinence)     Frequency  2. ONSET: When did the    start?     Last week  3. PAIN: Is there any pain? If Yes, ask: How bad is it? (Scale: 1-10; mild, moderate, severe)     Not sure 4. CAUSE: What do you think is causing the symptoms?     uti 5. OTHER SYMPTOMS: Do you have any other symptoms? (e.g., blood in urine, fever, flank pain, pain with urination)     Incontinence, order  Protocols used: Urinary Symptoms-A-AH

## 2024-05-19 NOTE — Telephone Encounter (Signed)
 FYI Only or Action Required?: Action required by provider: request for appointment.  Patient was last seen in primary care on 04/25/2024 by Terry Wilhelmena Lloyd Hilario, FNP.  Called Nurse Triage reporting Dysuria.  Symptoms began several days ago.  Interventions attempted: Rest, hydration, or home remedies.  Symptoms are: gradually worsening.  Triage Disposition: See Physician Within 24 Hours  Patient/caregiver understands and will follow disposition?: Unsure

## 2024-05-19 NOTE — Telephone Encounter (Signed)
 Copied from CRM #[id number]. Topic: Clinical - Home Health Verbal Orders >> May 19, 2024  3:58 PM Leonette SQUIBB wrote: Caller/Agency: Zacharia,  [facility]  Callback Number: [phone]  Service Requested: Occupational Therapy Frequency: 1 week 5   Any new concerns about the patient? Yes  Daughter thinks mom (patient) has a uti and is suppose to call the office.

## 2024-05-20 ENCOUNTER — Telehealth: Payer: Self-pay

## 2024-05-20 ENCOUNTER — Ambulatory Visit: Payer: Self-pay

## 2024-05-20 ENCOUNTER — Other Ambulatory Visit: Payer: Self-pay | Admitting: Internal Medicine

## 2024-05-20 DIAGNOSIS — N390 Urinary tract infection, site not specified: Secondary | ICD-10-CM

## 2024-05-20 MED ORDER — CEFPODOXIME PROXETIL 100 MG PO TABS: 100.0000 mg | ORAL_TABLET | Freq: Two times a day (BID) | ORAL | 0 refills | Status: DC

## 2024-05-20 NOTE — Telephone Encounter (Signed)
 Waiting for prescriber response

## 2024-05-20 NOTE — Telephone Encounter (Signed)
 Waiting on provider response see triage note

## 2024-05-20 NOTE — Telephone Encounter (Signed)
 Lvm informing

## 2024-05-20 NOTE — Telephone Encounter (Signed)
 Copied from CRM #[id number]. Topic: Clinical - Home Health Verbal Orders >> May 19, 2024  3:58 PM Leonette SQUIBB wrote: Caller/Agency: Zacharia,  [facility]  Callback Number: [phone]  Service Requested: Occupational Therapy Frequency: 1 week 5   Any new concerns about the patient? Yes  Daughter thinks mom (patient) has a uti and is suppose to call the office. >> May 20, 2024  1:32 PM Emylou G wrote: Daughter.. [phone] daughter: Mitzie Fees called.. returned our call.. She needs guidance to pick up the cup for testing?  She didn't get our message 10 min ago

## 2024-05-20 NOTE — Telephone Encounter (Signed)
 FYI Only or Action Required?: Action required by provider: clinical question for provider.  Patient was last seen in primary care on 04/25/2024 by Terry Wilhelmena Lloyd Hilario, FNP.  Called Nurse Triage reporting Fatigue.  Symptoms began yesterday.  Interventions attempted: Nothing.  Symptoms are: unchanged.  Triage Disposition: See Physician Within 24 Hours  Patient/caregiver understands and will follow disposition?: Yes   Copied from CRM #[id number]. Topic: Clinical - Red Word Triage >> May 20, 2024  3:15 PM Emylou G wrote: [facility] that prompted transfer to Nurse Triage: Pt daughter calling reporting that pt took a home uti test on Saturday and tested positive. Pt has frequent and chronic uti's. Pt daughter is asking if a urine sample can be dropped off for testing or if something can be called in for pt? Please advise Reason for Disposition  Bad or foul-smelling urine  Answer Assessment - Initial Assessment Questions UTI; showed positive for UTI on Saturday.   1. DESCRIPTION: Describe how you are feeling.     Pt's daughter reports think it hurts, because when I wipe. Reports yesterday cause her to be more tired and sleeping a lot. She wakes up and talks for a few minutes then goes back to sleep.  2. SEVERITY: How bad is it?  Can you stand and walk?     Wheelchair bound 3. ONSET: When did these symptoms begin? (e.g., hours, days, weeks, months)     Started yesterday 4. CAUSE: What do you think is causing the weakness or fatigue? (e.g., not drinking enough fluids, medical problem, trouble sleeping)     Reports still eating and drinking,  5. NEW MEDICINES:  Have you started on any new medicines recently? (e.g., opioid pain medicines, benzodiazepines, muscle relaxants, antidepressants, antihistamines, neuroleptics, beta blockers)     no 6. OTHER SYMPTOMS: Do you have any other symptoms? (e.g., chest pain, fever, cough, SOB, vomiting, diarrhea, bleeding, other areas of  pain)     Denies fever, chills Last temp; 96.9 bp 101/65 HR 74, O2 96%  Answer Assessment - Initial Assessment Questions Offered appt today, pt's daughter declined due to transportation. Advised UC today. ED if symptoms worsen. Pt's daughter reports will take to UC when her daughter gets off.  Patient daughter reports: sleeping more than normal and UTI; showed positive for UTI on Saturday.   1. DESCRIPTION: Describe how you are feeling.     Pt's daughter reports think it hurts, because when I wipe. Reports yesterday cause her to be more tired and sleeping a lot. She wakes up and talks for a few minutes then goes back to sleep.  This RN instructed pt's daughter to wake up patient. Patient easily aroused, answered questions appropriately, went back to sleep.  2. SEVERITY: How bad is it?  Can you stand and walk?     Wheelchair bound 3. ONSET: When did these symptoms begin? (e.g., hours, days, weeks, months)     Started yesterday 4. CAUSE: What do you think is causing the weakness or fatigue? (e.g., not drinking enough fluids, medical problem, trouble sleeping)     Reports still eating and drinking,  5. NEW MEDICINES:  Have you started on any new medicines recently? (e.g., opioid pain medicines, benzodiazepines, muscle relaxants, antidepressants, antihistamines, neuroleptics, beta blockers)     no 6. OTHER SYMPTOMS: Do you have any other symptoms? (e.g., chest pain, fever, cough, SOB, vomiting, diarrhea, bleeding, other areas of pain)     Denies fever, chills. Reports urine odor and positive UTI home  test. Last temp; 96.9 bp 101/65 HR 74, O2 96%  Protocols used: Weakness (Generalized) and Fatigue-A-AH, Urinary Symptoms-A-AH

## 2024-05-21 ENCOUNTER — Telehealth: Payer: Self-pay

## 2024-05-21 ENCOUNTER — Ambulatory Visit (INDEPENDENT_AMBULATORY_CARE_PROVIDER_SITE_OTHER): Payer: Self-pay | Admitting: *Deleted

## 2024-05-21 DIAGNOSIS — Z5181 Encounter for therapeutic drug level monitoring: Secondary | ICD-10-CM

## 2024-05-21 DIAGNOSIS — G4730 Sleep apnea, unspecified: Secondary | ICD-10-CM | POA: Diagnosis not present

## 2024-05-21 DIAGNOSIS — K219 Gastro-esophageal reflux disease without esophagitis: Secondary | ICD-10-CM | POA: Diagnosis not present

## 2024-05-21 DIAGNOSIS — J449 Chronic obstructive pulmonary disease, unspecified: Secondary | ICD-10-CM | POA: Diagnosis not present

## 2024-05-21 DIAGNOSIS — D631 Anemia in chronic kidney disease: Secondary | ICD-10-CM | POA: Diagnosis not present

## 2024-05-21 DIAGNOSIS — I129 Hypertensive chronic kidney disease with stage 1 through stage 4 chronic kidney disease, or unspecified chronic kidney disease: Secondary | ICD-10-CM | POA: Diagnosis not present

## 2024-05-21 DIAGNOSIS — I4891 Unspecified atrial fibrillation: Secondary | ICD-10-CM

## 2024-05-21 DIAGNOSIS — E039 Hypothyroidism, unspecified: Secondary | ICD-10-CM | POA: Diagnosis not present

## 2024-05-21 DIAGNOSIS — N184 Chronic kidney disease, stage 4 (severe): Secondary | ICD-10-CM | POA: Diagnosis not present

## 2024-05-21 DIAGNOSIS — E871 Hypo-osmolality and hyponatremia: Secondary | ICD-10-CM | POA: Diagnosis not present

## 2024-05-21 DIAGNOSIS — M6281 Muscle weakness (generalized): Secondary | ICD-10-CM | POA: Diagnosis not present

## 2024-05-21 DIAGNOSIS — L89616 Pressure-induced deep tissue damage of right heel: Secondary | ICD-10-CM | POA: Diagnosis not present

## 2024-05-21 DIAGNOSIS — H3530 Unspecified macular degeneration: Secondary | ICD-10-CM | POA: Diagnosis not present

## 2024-05-21 DIAGNOSIS — M1990 Unspecified osteoarthritis, unspecified site: Secondary | ICD-10-CM | POA: Diagnosis not present

## 2024-05-21 DIAGNOSIS — N390 Urinary tract infection, site not specified: Secondary | ICD-10-CM | POA: Diagnosis not present

## 2024-05-21 DIAGNOSIS — N179 Acute kidney failure, unspecified: Secondary | ICD-10-CM | POA: Diagnosis not present

## 2024-05-21 DIAGNOSIS — B952 Enterococcus as the cause of diseases classified elsewhere: Secondary | ICD-10-CM | POA: Diagnosis not present

## 2024-05-21 DIAGNOSIS — L89626 Pressure-induced deep tissue damage of left heel: Secondary | ICD-10-CM | POA: Diagnosis not present

## 2024-05-21 DIAGNOSIS — I480 Paroxysmal atrial fibrillation: Secondary | ICD-10-CM | POA: Diagnosis not present

## 2024-05-21 DIAGNOSIS — E785 Hyperlipidemia, unspecified: Secondary | ICD-10-CM | POA: Diagnosis not present

## 2024-05-21 LAB — POCT INR: INR: 3.4 — AB (ref 2.0–3.0)

## 2024-05-21 NOTE — Telephone Encounter (Unsigned)
 Copied from CRM #[id number]. Topic: Clinical - Home Health Verbal Orders >> May 21, 2024 10:43 AM Olam RAMAN wrote: Caller/Agency: Lacinda Rushing [phone] Service Requested: Skilled Nursing Frequency: once a week Any new concerns about the patient? Yes caller stating pt might have a uti. Dghter called home health last night and would like to request a verbal order

## 2024-05-21 NOTE — Telephone Encounter (Signed)
 Called pt to attempt to triage: Pt/granddaughter stated they had already spoken to someone and her Mom just left to go pick up urine hat.  Granddaughter did state she called in earlier to inquire about the ABX the patient was prescribed: wanted to make sure this would not affect kidneys- please call back and inform if this medication is the correct rx for pt to be taking.

## 2024-05-21 NOTE — Telephone Encounter (Signed)
 Patient's daughter requesting update on whether patient can take the prescription. Daughter states patient was in the hospital and was in there because of her kidneys. Daughter inquiring if cefpodoxime   (VANTIN ) 100mg  okay to take for patient.   Requesting callback: [phone]

## 2024-05-21 NOTE — Telephone Encounter (Signed)
 Copied from CRM [id number]. Topic: Clinical - Prescription Issue >> May 21, 2024 10:36 AM [name] BROCKS wrote: Reason for CRM:   cefpodoxime  (VANTIN ) 100 MG tablet  Pts grand daughter called and stated that the pharmacist advised her that this medication could effect her renal function. Last medication did and she was in the hosp because of it. Could you please go overr this medication and also see if there is something else that she can take to not effect her renal function.  Please call grand daughter at [phone]

## 2024-05-21 NOTE — Progress Notes (Signed)
 INR 3.4  Please see anticoagulation encounter

## 2024-05-21 NOTE — Telephone Encounter (Signed)
 Lvm informing

## 2024-05-21 NOTE — Patient Instructions (Signed)
 Spoke with Donna LPN [facility].  Orders given. Hold warfarin tonight then resume 1/2 tablet (2.5mg ) daily. Recheck on 05/28/24

## 2024-05-21 NOTE — Telephone Encounter (Signed)
 Daughter informed

## 2024-05-22 DIAGNOSIS — H3530 Unspecified macular degeneration: Secondary | ICD-10-CM | POA: Diagnosis not present

## 2024-05-22 DIAGNOSIS — E785 Hyperlipidemia, unspecified: Secondary | ICD-10-CM | POA: Diagnosis not present

## 2024-05-22 DIAGNOSIS — I129 Hypertensive chronic kidney disease with stage 1 through stage 4 chronic kidney disease, or unspecified chronic kidney disease: Secondary | ICD-10-CM | POA: Diagnosis not present

## 2024-05-22 DIAGNOSIS — I480 Paroxysmal atrial fibrillation: Secondary | ICD-10-CM | POA: Diagnosis not present

## 2024-05-22 DIAGNOSIS — L89626 Pressure-induced deep tissue damage of left heel: Secondary | ICD-10-CM | POA: Diagnosis not present

## 2024-05-22 DIAGNOSIS — D631 Anemia in chronic kidney disease: Secondary | ICD-10-CM | POA: Diagnosis not present

## 2024-05-22 DIAGNOSIS — E871 Hypo-osmolality and hyponatremia: Secondary | ICD-10-CM | POA: Diagnosis not present

## 2024-05-22 DIAGNOSIS — N179 Acute kidney failure, unspecified: Secondary | ICD-10-CM | POA: Diagnosis not present

## 2024-05-22 DIAGNOSIS — K219 Gastro-esophageal reflux disease without esophagitis: Secondary | ICD-10-CM | POA: Diagnosis not present

## 2024-05-22 DIAGNOSIS — M6281 Muscle weakness (generalized): Secondary | ICD-10-CM | POA: Diagnosis not present

## 2024-05-22 DIAGNOSIS — G4730 Sleep apnea, unspecified: Secondary | ICD-10-CM | POA: Diagnosis not present

## 2024-05-22 DIAGNOSIS — N390 Urinary tract infection, site not specified: Secondary | ICD-10-CM | POA: Diagnosis not present

## 2024-05-22 DIAGNOSIS — L89616 Pressure-induced deep tissue damage of right heel: Secondary | ICD-10-CM | POA: Diagnosis not present

## 2024-05-22 DIAGNOSIS — M1990 Unspecified osteoarthritis, unspecified site: Secondary | ICD-10-CM | POA: Diagnosis not present

## 2024-05-22 DIAGNOSIS — J449 Chronic obstructive pulmonary disease, unspecified: Secondary | ICD-10-CM | POA: Diagnosis not present

## 2024-05-22 DIAGNOSIS — E039 Hypothyroidism, unspecified: Secondary | ICD-10-CM | POA: Diagnosis not present

## 2024-05-22 DIAGNOSIS — B952 Enterococcus as the cause of diseases classified elsewhere: Secondary | ICD-10-CM | POA: Diagnosis not present

## 2024-05-22 DIAGNOSIS — N184 Chronic kidney disease, stage 4 (severe): Secondary | ICD-10-CM | POA: Diagnosis not present

## 2024-05-23 ENCOUNTER — Telehealth: Payer: Self-pay

## 2024-05-23 DIAGNOSIS — R2681 Unsteadiness on feet: Secondary | ICD-10-CM | POA: Diagnosis not present

## 2024-05-23 DIAGNOSIS — M6281 Muscle weakness (generalized): Secondary | ICD-10-CM | POA: Diagnosis not present

## 2024-05-23 DIAGNOSIS — R609 Edema, unspecified: Secondary | ICD-10-CM | POA: Diagnosis not present

## 2024-05-23 NOTE — Telephone Encounter (Signed)
 Copied from CRM #[id number]. Topic: Clinical - Medical Advice >> May 23, 2024 10:05 AM Miller H wrote: Reason for CRM: Patient daughter would like to know more information about antibiotics the patient is taking// Please call [phone]

## 2024-05-23 NOTE — Telephone Encounter (Signed)
 Spoke to daughter.

## 2024-05-27 DIAGNOSIS — M1990 Unspecified osteoarthritis, unspecified site: Secondary | ICD-10-CM | POA: Diagnosis not present

## 2024-05-27 DIAGNOSIS — J449 Chronic obstructive pulmonary disease, unspecified: Secondary | ICD-10-CM | POA: Diagnosis not present

## 2024-05-27 DIAGNOSIS — E039 Hypothyroidism, unspecified: Secondary | ICD-10-CM | POA: Diagnosis not present

## 2024-05-27 DIAGNOSIS — L89616 Pressure-induced deep tissue damage of right heel: Secondary | ICD-10-CM | POA: Diagnosis not present

## 2024-05-27 DIAGNOSIS — M6281 Muscle weakness (generalized): Secondary | ICD-10-CM | POA: Diagnosis not present

## 2024-05-27 DIAGNOSIS — I129 Hypertensive chronic kidney disease with stage 1 through stage 4 chronic kidney disease, or unspecified chronic kidney disease: Secondary | ICD-10-CM | POA: Diagnosis not present

## 2024-05-27 DIAGNOSIS — L89626 Pressure-induced deep tissue damage of left heel: Secondary | ICD-10-CM | POA: Diagnosis not present

## 2024-05-27 DIAGNOSIS — E785 Hyperlipidemia, unspecified: Secondary | ICD-10-CM | POA: Diagnosis not present

## 2024-05-27 DIAGNOSIS — H3530 Unspecified macular degeneration: Secondary | ICD-10-CM | POA: Diagnosis not present

## 2024-05-27 DIAGNOSIS — G4730 Sleep apnea, unspecified: Secondary | ICD-10-CM | POA: Diagnosis not present

## 2024-05-27 DIAGNOSIS — N184 Chronic kidney disease, stage 4 (severe): Secondary | ICD-10-CM | POA: Diagnosis not present

## 2024-05-27 DIAGNOSIS — E871 Hypo-osmolality and hyponatremia: Secondary | ICD-10-CM | POA: Diagnosis not present

## 2024-05-27 DIAGNOSIS — N390 Urinary tract infection, site not specified: Secondary | ICD-10-CM | POA: Diagnosis not present

## 2024-05-27 DIAGNOSIS — K219 Gastro-esophageal reflux disease without esophagitis: Secondary | ICD-10-CM | POA: Diagnosis not present

## 2024-05-27 DIAGNOSIS — N179 Acute kidney failure, unspecified: Secondary | ICD-10-CM | POA: Diagnosis not present

## 2024-05-27 DIAGNOSIS — I480 Paroxysmal atrial fibrillation: Secondary | ICD-10-CM | POA: Diagnosis not present

## 2024-05-27 DIAGNOSIS — D631 Anemia in chronic kidney disease: Secondary | ICD-10-CM | POA: Diagnosis not present

## 2024-05-27 DIAGNOSIS — B952 Enterococcus as the cause of diseases classified elsewhere: Secondary | ICD-10-CM | POA: Diagnosis not present

## 2024-05-27 LAB — UA/M W/RFLX CULTURE, ROUTINE
Bilirubin, UA: NEGATIVE
Glucose, UA: NEGATIVE
Ketones, UA: NEGATIVE
Nitrite, UA: POSITIVE — AB
Specific Gravity, UA: 1.016 (ref 1.005–1.030)
Urobilinogen, Ur: 0.2 mg/dL (ref 0.2–1.0)
pH, UA: 6.5 (ref 5.0–7.5)

## 2024-05-27 LAB — URINE CULTURE, REFLEX

## 2024-05-27 LAB — MICROSCOPIC EXAMINATION
Bacteria, UA: NONE SEEN
Epithelial Cells (non renal): NONE SEEN /HPF (ref 0–10)
RBC, Urine: >30 /HPF — AB (ref 0–2)
WBC, UA: >30 /HPF — AB (ref 0–5)

## 2024-05-28 ENCOUNTER — Telehealth: Payer: Self-pay

## 2024-05-28 ENCOUNTER — Ambulatory Visit (INDEPENDENT_AMBULATORY_CARE_PROVIDER_SITE_OTHER): Payer: Self-pay | Admitting: *Deleted

## 2024-05-28 ENCOUNTER — Other Ambulatory Visit: Payer: Self-pay | Admitting: Internal Medicine

## 2024-05-28 DIAGNOSIS — M1990 Unspecified osteoarthritis, unspecified site: Secondary | ICD-10-CM | POA: Diagnosis not present

## 2024-05-28 DIAGNOSIS — N184 Chronic kidney disease, stage 4 (severe): Secondary | ICD-10-CM | POA: Diagnosis not present

## 2024-05-28 DIAGNOSIS — I129 Hypertensive chronic kidney disease with stage 1 through stage 4 chronic kidney disease, or unspecified chronic kidney disease: Secondary | ICD-10-CM | POA: Diagnosis not present

## 2024-05-28 DIAGNOSIS — I482 Chronic atrial fibrillation, unspecified: Secondary | ICD-10-CM | POA: Diagnosis not present

## 2024-05-28 DIAGNOSIS — Z5181 Encounter for therapeutic drug level monitoring: Secondary | ICD-10-CM | POA: Diagnosis not present

## 2024-05-28 DIAGNOSIS — E039 Hypothyroidism, unspecified: Secondary | ICD-10-CM | POA: Diagnosis not present

## 2024-05-28 DIAGNOSIS — N390 Urinary tract infection, site not specified: Secondary | ICD-10-CM | POA: Diagnosis not present

## 2024-05-28 DIAGNOSIS — D631 Anemia in chronic kidney disease: Secondary | ICD-10-CM | POA: Diagnosis not present

## 2024-05-28 DIAGNOSIS — L89626 Pressure-induced deep tissue damage of left heel: Secondary | ICD-10-CM | POA: Diagnosis not present

## 2024-05-28 DIAGNOSIS — K219 Gastro-esophageal reflux disease without esophagitis: Secondary | ICD-10-CM | POA: Diagnosis not present

## 2024-05-28 DIAGNOSIS — J449 Chronic obstructive pulmonary disease, unspecified: Secondary | ICD-10-CM | POA: Diagnosis not present

## 2024-05-28 DIAGNOSIS — B952 Enterococcus as the cause of diseases classified elsewhere: Secondary | ICD-10-CM | POA: Diagnosis not present

## 2024-05-28 DIAGNOSIS — N179 Acute kidney failure, unspecified: Secondary | ICD-10-CM | POA: Diagnosis not present

## 2024-05-28 DIAGNOSIS — H3530 Unspecified macular degeneration: Secondary | ICD-10-CM | POA: Diagnosis not present

## 2024-05-28 DIAGNOSIS — G4730 Sleep apnea, unspecified: Secondary | ICD-10-CM | POA: Diagnosis not present

## 2024-05-28 DIAGNOSIS — L89616 Pressure-induced deep tissue damage of right heel: Secondary | ICD-10-CM | POA: Diagnosis not present

## 2024-05-28 DIAGNOSIS — M6281 Muscle weakness (generalized): Secondary | ICD-10-CM | POA: Diagnosis not present

## 2024-05-28 DIAGNOSIS — E785 Hyperlipidemia, unspecified: Secondary | ICD-10-CM | POA: Diagnosis not present

## 2024-05-28 DIAGNOSIS — I480 Paroxysmal atrial fibrillation: Secondary | ICD-10-CM | POA: Diagnosis not present

## 2024-05-28 DIAGNOSIS — E871 Hypo-osmolality and hyponatremia: Secondary | ICD-10-CM | POA: Diagnosis not present

## 2024-05-28 LAB — POCT INR: INR: 3.4 — AB (ref 2.0–3.0)

## 2024-05-28 MED ORDER — NITROFURANTOIN MONOHYD MACRO 100 MG PO CAPS: 100.0000 mg | ORAL_CAPSULE | Freq: Every day | ORAL | 0 refills | Status: DC

## 2024-05-28 NOTE — Patient Instructions (Signed)
 Spoke with Donna LPN [facility].  Orders given. Decrease warfarin to 1/2 tablet (2.5mg ) daily except none on Wednesdays Recheck on 06/04/24

## 2024-05-28 NOTE — Telephone Encounter (Signed)
 Copied from CRM #[id number]. Topic: Clinical - Medical Advice >> May 27, 2024  3:10 PM Teressa P wrote: Reason for CRM: Patient's daughter Cherlyn called back and said the medication that was prescribed for the infection was hard on her kidneys and she wants to know if there is something else she can take.  CN#  [phone]

## 2024-05-28 NOTE — Progress Notes (Signed)
 INR 3.4  Please see anticoagulation encounter

## 2024-05-28 NOTE — Telephone Encounter (Signed)
 Pt daughter informed

## 2024-06-02 DIAGNOSIS — Z515 Encounter for palliative care: Secondary | ICD-10-CM | POA: Diagnosis not present

## 2024-06-03 ENCOUNTER — Encounter: Payer: Self-pay | Admitting: Family Medicine

## 2024-06-03 DIAGNOSIS — D631 Anemia in chronic kidney disease: Secondary | ICD-10-CM | POA: Diagnosis not present

## 2024-06-03 DIAGNOSIS — M6281 Muscle weakness (generalized): Secondary | ICD-10-CM | POA: Diagnosis not present

## 2024-06-03 DIAGNOSIS — E039 Hypothyroidism, unspecified: Secondary | ICD-10-CM | POA: Diagnosis not present

## 2024-06-03 DIAGNOSIS — E785 Hyperlipidemia, unspecified: Secondary | ICD-10-CM | POA: Diagnosis not present

## 2024-06-03 DIAGNOSIS — I480 Paroxysmal atrial fibrillation: Secondary | ICD-10-CM | POA: Diagnosis not present

## 2024-06-03 DIAGNOSIS — H3530 Unspecified macular degeneration: Secondary | ICD-10-CM | POA: Diagnosis not present

## 2024-06-03 DIAGNOSIS — N179 Acute kidney failure, unspecified: Secondary | ICD-10-CM | POA: Diagnosis not present

## 2024-06-03 DIAGNOSIS — N184 Chronic kidney disease, stage 4 (severe): Secondary | ICD-10-CM | POA: Diagnosis not present

## 2024-06-03 DIAGNOSIS — E871 Hypo-osmolality and hyponatremia: Secondary | ICD-10-CM | POA: Diagnosis not present

## 2024-06-03 DIAGNOSIS — B952 Enterococcus as the cause of diseases classified elsewhere: Secondary | ICD-10-CM | POA: Diagnosis not present

## 2024-06-03 DIAGNOSIS — M1990 Unspecified osteoarthritis, unspecified site: Secondary | ICD-10-CM | POA: Diagnosis not present

## 2024-06-03 DIAGNOSIS — L89626 Pressure-induced deep tissue damage of left heel: Secondary | ICD-10-CM | POA: Diagnosis not present

## 2024-06-03 DIAGNOSIS — G4730 Sleep apnea, unspecified: Secondary | ICD-10-CM | POA: Diagnosis not present

## 2024-06-03 DIAGNOSIS — J449 Chronic obstructive pulmonary disease, unspecified: Secondary | ICD-10-CM | POA: Diagnosis not present

## 2024-06-03 DIAGNOSIS — N390 Urinary tract infection, site not specified: Secondary | ICD-10-CM | POA: Diagnosis not present

## 2024-06-03 DIAGNOSIS — K219 Gastro-esophageal reflux disease without esophagitis: Secondary | ICD-10-CM | POA: Diagnosis not present

## 2024-06-03 DIAGNOSIS — L89616 Pressure-induced deep tissue damage of right heel: Secondary | ICD-10-CM | POA: Diagnosis not present

## 2024-06-03 DIAGNOSIS — I129 Hypertensive chronic kidney disease with stage 1 through stage 4 chronic kidney disease, or unspecified chronic kidney disease: Secondary | ICD-10-CM | POA: Diagnosis not present

## 2024-06-04 DIAGNOSIS — E785 Hyperlipidemia, unspecified: Secondary | ICD-10-CM | POA: Diagnosis not present

## 2024-06-04 DIAGNOSIS — N184 Chronic kidney disease, stage 4 (severe): Secondary | ICD-10-CM | POA: Diagnosis not present

## 2024-06-04 DIAGNOSIS — M6281 Muscle weakness (generalized): Secondary | ICD-10-CM | POA: Diagnosis not present

## 2024-06-04 DIAGNOSIS — M1990 Unspecified osteoarthritis, unspecified site: Secondary | ICD-10-CM | POA: Diagnosis not present

## 2024-06-04 DIAGNOSIS — N390 Urinary tract infection, site not specified: Secondary | ICD-10-CM | POA: Diagnosis not present

## 2024-06-04 DIAGNOSIS — I480 Paroxysmal atrial fibrillation: Secondary | ICD-10-CM | POA: Diagnosis not present

## 2024-06-04 DIAGNOSIS — G4730 Sleep apnea, unspecified: Secondary | ICD-10-CM | POA: Diagnosis not present

## 2024-06-04 DIAGNOSIS — B952 Enterococcus as the cause of diseases classified elsewhere: Secondary | ICD-10-CM | POA: Diagnosis not present

## 2024-06-04 DIAGNOSIS — H3530 Unspecified macular degeneration: Secondary | ICD-10-CM | POA: Diagnosis not present

## 2024-06-04 DIAGNOSIS — D631 Anemia in chronic kidney disease: Secondary | ICD-10-CM | POA: Diagnosis not present

## 2024-06-04 DIAGNOSIS — E039 Hypothyroidism, unspecified: Secondary | ICD-10-CM | POA: Diagnosis not present

## 2024-06-04 DIAGNOSIS — K219 Gastro-esophageal reflux disease without esophagitis: Secondary | ICD-10-CM | POA: Diagnosis not present

## 2024-06-04 DIAGNOSIS — I129 Hypertensive chronic kidney disease with stage 1 through stage 4 chronic kidney disease, or unspecified chronic kidney disease: Secondary | ICD-10-CM | POA: Diagnosis not present

## 2024-06-04 DIAGNOSIS — N179 Acute kidney failure, unspecified: Secondary | ICD-10-CM | POA: Diagnosis not present

## 2024-06-04 DIAGNOSIS — J449 Chronic obstructive pulmonary disease, unspecified: Secondary | ICD-10-CM | POA: Diagnosis not present

## 2024-06-04 DIAGNOSIS — L89626 Pressure-induced deep tissue damage of left heel: Secondary | ICD-10-CM | POA: Diagnosis not present

## 2024-06-04 DIAGNOSIS — L89616 Pressure-induced deep tissue damage of right heel: Secondary | ICD-10-CM | POA: Diagnosis not present

## 2024-06-04 DIAGNOSIS — E871 Hypo-osmolality and hyponatremia: Secondary | ICD-10-CM | POA: Diagnosis not present

## 2024-06-05 ENCOUNTER — Inpatient Hospital Stay (HOSPITAL_COMMUNITY)
Admission: EM | Admit: 2024-06-05 | Discharge: 2024-06-08 | DRG: 683 | Disposition: A | Discharge status: Home Health | Attending: Family Medicine | Admitting: Family Medicine

## 2024-06-05 ENCOUNTER — Encounter (HOSPITAL_COMMUNITY): Payer: Self-pay

## 2024-06-05 ENCOUNTER — Encounter: Payer: Self-pay | Admitting: *Deleted

## 2024-06-05 ENCOUNTER — Other Ambulatory Visit: Payer: Self-pay

## 2024-06-05 DIAGNOSIS — I480 Paroxysmal atrial fibrillation: Secondary | ICD-10-CM | POA: Diagnosis present

## 2024-06-05 DIAGNOSIS — N390 Urinary tract infection, site not specified: Secondary | ICD-10-CM

## 2024-06-05 DIAGNOSIS — I129 Hypertensive chronic kidney disease with stage 1 through stage 4 chronic kidney disease, or unspecified chronic kidney disease: Secondary | ICD-10-CM | POA: Diagnosis not present

## 2024-06-05 DIAGNOSIS — Z7901 Long term (current) use of anticoagulants: Secondary | ICD-10-CM

## 2024-06-05 DIAGNOSIS — Z66 Do not resuscitate: Secondary | ICD-10-CM | POA: Diagnosis present

## 2024-06-05 DIAGNOSIS — B9629 Other Escherichia coli [E. coli] as the cause of diseases classified elsewhere: Secondary | ICD-10-CM | POA: Diagnosis present

## 2024-06-05 DIAGNOSIS — D6832 Hemorrhagic disorder due to extrinsic circulating anticoagulants: Secondary | ICD-10-CM | POA: Diagnosis present

## 2024-06-05 DIAGNOSIS — Z7982 Long term (current) use of aspirin: Secondary | ICD-10-CM | POA: Diagnosis not present

## 2024-06-05 DIAGNOSIS — B961 Klebsiella pneumoniae [K. pneumoniae] as the cause of diseases classified elsewhere: Secondary | ICD-10-CM | POA: Diagnosis present

## 2024-06-05 DIAGNOSIS — I10 Essential (primary) hypertension: Secondary | ICD-10-CM | POA: Diagnosis not present

## 2024-06-05 DIAGNOSIS — E039 Hypothyroidism, unspecified: Secondary | ICD-10-CM | POA: Diagnosis not present

## 2024-06-05 DIAGNOSIS — B9620 Unspecified Escherichia coli [E. coli] as the cause of diseases classified elsewhere: Secondary | ICD-10-CM | POA: Diagnosis present

## 2024-06-05 DIAGNOSIS — L89609 Pressure ulcer of unspecified heel, unspecified stage: Secondary | ICD-10-CM | POA: Insufficient documentation

## 2024-06-05 DIAGNOSIS — L89629 Pressure ulcer of left heel, unspecified stage: Secondary | ICD-10-CM | POA: Diagnosis not present

## 2024-06-05 DIAGNOSIS — Z79890 Hormone replacement therapy (postmenopausal): Secondary | ICD-10-CM

## 2024-06-05 DIAGNOSIS — N184 Chronic kidney disease, stage 4 (severe): Secondary | ICD-10-CM | POA: Diagnosis present

## 2024-06-05 DIAGNOSIS — N3001 Acute cystitis with hematuria: Secondary | ICD-10-CM | POA: Diagnosis present

## 2024-06-05 DIAGNOSIS — Z7401 Bed confinement status: Secondary | ICD-10-CM | POA: Diagnosis not present

## 2024-06-05 DIAGNOSIS — K922 Gastrointestinal hemorrhage, unspecified: Secondary | ICD-10-CM | POA: Diagnosis not present

## 2024-06-05 DIAGNOSIS — N179 Acute kidney failure, unspecified: Principal | ICD-10-CM | POA: Diagnosis present

## 2024-06-05 DIAGNOSIS — L89619 Pressure ulcer of right heel, unspecified stage: Secondary | ICD-10-CM | POA: Diagnosis not present

## 2024-06-05 DIAGNOSIS — R112 Nausea with vomiting, unspecified: Secondary | ICD-10-CM | POA: Diagnosis not present

## 2024-06-05 DIAGNOSIS — E7800 Pure hypercholesterolemia, unspecified: Secondary | ICD-10-CM | POA: Diagnosis not present

## 2024-06-05 DIAGNOSIS — Z950 Presence of cardiac pacemaker: Secondary | ICD-10-CM | POA: Diagnosis present

## 2024-06-05 DIAGNOSIS — T45515A Adverse effect of anticoagulants, initial encounter: Secondary | ICD-10-CM | POA: Diagnosis not present

## 2024-06-05 DIAGNOSIS — H9190 Unspecified hearing loss, unspecified ear: Secondary | ICD-10-CM | POA: Diagnosis present

## 2024-06-05 DIAGNOSIS — F411 Generalized anxiety disorder: Secondary | ICD-10-CM | POA: Diagnosis not present

## 2024-06-05 DIAGNOSIS — E860 Dehydration: Secondary | ICD-10-CM | POA: Diagnosis present

## 2024-06-05 DIAGNOSIS — R413 Other amnesia: Secondary | ICD-10-CM

## 2024-06-05 DIAGNOSIS — R791 Abnormal coagulation profile: Secondary | ICD-10-CM | POA: Diagnosis present

## 2024-06-05 DIAGNOSIS — Z1624 Resistance to multiple antibiotics: Secondary | ICD-10-CM | POA: Diagnosis not present

## 2024-06-05 DIAGNOSIS — R531 Weakness: Secondary | ICD-10-CM | POA: Diagnosis not present

## 2024-06-05 DIAGNOSIS — R1110 Vomiting, unspecified: Secondary | ICD-10-CM | POA: Diagnosis present

## 2024-06-05 DIAGNOSIS — Z1612 Extended spectrum beta lactamase (ESBL) resistance: Secondary | ICD-10-CM | POA: Diagnosis present

## 2024-06-05 DIAGNOSIS — Z743 Need for continuous supervision: Secondary | ICD-10-CM | POA: Diagnosis not present

## 2024-06-05 LAB — LIPASE, BLOOD: Lipase: 39 U/L (ref 11–51)

## 2024-06-05 LAB — CBC WITH DIFFERENTIAL/PLATELET
Abs Immature Granulocytes: 0.03 K/uL (ref 0.00–0.07)
Basophils Absolute: 0.1 K/uL (ref 0.0–0.1)
Basophils Relative: 1 %
Eosinophils Absolute: 0.1 K/uL (ref 0.0–0.5)
Eosinophils Relative: 1 %
HCT: 35.8 % — ABNORMAL LOW (ref 36.0–46.0)
Hemoglobin: 11.1 g/dL — ABNORMAL LOW (ref 12.0–15.0)
Immature Granulocytes: 0 %
Lymphocytes Relative: 38 %
Lymphs Abs: 3.2 K/uL (ref 0.7–4.0)
MCH: 30 pg (ref 26.0–34.0)
MCHC: 31 g/dL (ref 30.0–36.0)
MCV: 96.8 fL (ref 80.0–100.0)
Monocytes Absolute: 0.8 K/uL (ref 0.1–1.0)
Monocytes Relative: 9 %
Neutro Abs: 4.3 K/uL (ref 1.7–7.7)
Neutrophils Relative %: 51 %
Platelets: 377 K/uL (ref 150–400)
RBC: 3.7 MIL/uL — ABNORMAL LOW (ref 3.87–5.11)
RDW: 15.5 % (ref 11.5–15.5)
WBC: 8.4 K/uL (ref 4.0–10.5)
nRBC: 0 % (ref 0.0–0.2)

## 2024-06-05 LAB — COMPREHENSIVE METABOLIC PANEL WITH GFR
ALT: 15 U/L (ref 0–44)
AST: 39 U/L (ref 15–41)
Albumin: 2.5 g/dL — ABNORMAL LOW (ref 3.5–5.0)
Alkaline Phosphatase: 249 U/L — ABNORMAL HIGH (ref 38–126)
Anion gap: 10 (ref 5–15)
BUN: 43 mg/dL — ABNORMAL HIGH (ref 8–23)
CO2: 22 mmol/L (ref 22–32)
Calcium: 9 mg/dL (ref 8.9–10.3)
Chloride: 103 mmol/L (ref 98–111)
Creatinine, Ser: 2.85 mg/dL — ABNORMAL HIGH (ref 0.44–1.00)
GFR, Estimated: 15 mL/min — ABNORMAL LOW (ref >60)
Glucose, Bld: 85 mg/dL (ref 70–99)
Potassium: 4.1 mmol/L (ref 3.5–5.1)
Sodium: 135 mmol/L (ref 135–145)
Total Bilirubin: 0.5 mg/dL (ref 0.0–1.2)
Total Protein: 5.9 g/dL — ABNORMAL LOW (ref 6.5–8.1)

## 2024-06-05 LAB — URINALYSIS, W/ REFLEX TO CULTURE (INFECTION SUSPECTED)
Glucose, UA: 100 mg/dL — AB
Ketones, ur: 15 mg/dL — AB
Nitrite: POSITIVE — AB
Protein, ur: >300 mg/dL — AB
Specific Gravity, Urine: 1.02 (ref 1.005–1.030)
WBC, UA: >50 WBC/hpf (ref 0–5)
pH: 6.5 (ref 5.0–8.0)

## 2024-06-05 LAB — HEMOGLOBIN AND HEMATOCRIT, BLOOD
HCT: 39.1 % (ref 36.0–46.0)
HCT: 28 % — ABNORMAL LOW (ref 36.0–46.0)
Hemoglobin: 11.8 g/dL — ABNORMAL LOW (ref 12.0–15.0)
Hemoglobin: 8.7 g/dL — ABNORMAL LOW (ref 12.0–15.0)

## 2024-06-05 LAB — ABO/RH: ABO/RH(D): B NEG

## 2024-06-05 LAB — PREPARE RBC (CROSSMATCH)

## 2024-06-05 MED ORDER — ONDANSETRON HCL 4 MG/2ML IJ SOLN
4.0000 mg | Freq: Once | INTRAMUSCULAR | Status: AC
  Administered 2024-06-05: 4 mg via INTRAVENOUS
  Filled 2024-06-05: qty 2

## 2024-06-05 MED ORDER — BUSPIRONE HCL 15 MG PO TABS
7.5000 mg | ORAL_TABLET | Freq: Two times a day (BID) | ORAL | Status: DC
  Administered 2024-06-05 – 2024-06-08 (×7): 7.5 mg via ORAL
  Filled 2024-06-05 (×5): qty 1
  Filled 2024-06-05: qty 2
  Filled 2024-06-05: qty 1

## 2024-06-05 MED ORDER — ACETAMINOPHEN 325 MG PO TABS
650.0000 mg | ORAL_TABLET | Freq: Four times a day (QID) | ORAL | Status: DC | PRN
  Administered 2024-06-05: 650 mg via ORAL
  Filled 2024-06-05: qty 2

## 2024-06-05 MED ORDER — DILTIAZEM HCL ER COATED BEADS 240 MG PO CP24
240.0000 mg | ORAL_CAPSULE | Freq: Every day | ORAL | Status: DC
  Administered 2024-06-05 – 2024-06-08 (×3): 240 mg via ORAL
  Filled 2024-06-05 (×4): qty 1

## 2024-06-05 MED ORDER — WARFARIN - PHARMACIST DOSING INPATIENT: Freq: Every day | Status: DC

## 2024-06-05 MED ORDER — SODIUM CHLORIDE 0.9% IV SOLUTION: Freq: Once | INTRAVENOUS | Status: AC

## 2024-06-05 MED ORDER — DONEPEZIL HCL 5 MG PO TABS
10.0000 mg | ORAL_TABLET | Freq: Every day | ORAL | Status: DC
  Administered 2024-06-05 – 2024-06-07 (×3): 10 mg via ORAL
  Filled 2024-06-05 (×4): qty 2

## 2024-06-05 MED ORDER — MEDIHONEY WOUND/BURN DRESSING EX PSTE
1.0000 | PASTE | Freq: Every day | CUTANEOUS | Status: DC
Start: 2024-06-05 — End: 2024-06-05
  Filled 2024-06-05: qty 44

## 2024-06-05 MED ORDER — LEVOTHYROXINE SODIUM 112 MCG PO TABS
112.0000 ug | ORAL_TABLET | Freq: Every day | ORAL | Status: DC
  Administered 2024-06-05 – 2024-06-08 (×4): 112 ug via ORAL
  Filled 2024-06-05 (×4): qty 1

## 2024-06-05 MED ORDER — METOPROLOL TARTRATE 25 MG PO TABS
25.0000 mg | ORAL_TABLET | Freq: Two times a day (BID) | ORAL | Status: DC
  Administered 2024-06-05 – 2024-06-08 (×5): 25 mg via ORAL
  Filled 2024-06-05 (×6): qty 1

## 2024-06-05 MED ORDER — MEROPENEM 500 MG IV SOLR
500.0000 mg | Freq: Two times a day (BID) | INTRAVENOUS | Status: DC
  Administered 2024-06-05 – 2024-06-08 (×7): 500 mg via INTRAVENOUS
  Filled 2024-06-05 (×10): qty 10

## 2024-06-05 MED ORDER — MEDIHONEY WOUND/BURN DRESSING EX PSTE
1.0000 | PASTE | Freq: Every day | CUTANEOUS | Status: DC
  Administered 2024-06-05 – 2024-06-08 (×4): 1 via TOPICAL
  Filled 2024-06-05 (×3): qty 44

## 2024-06-05 MED ORDER — URSODIOL 300 MG PO CAPS
300.0000 mg | ORAL_CAPSULE | Freq: Three times a day (TID) | ORAL | Status: DC
  Administered 2024-06-05 – 2024-06-08 (×8): 300 mg via ORAL
  Filled 2024-06-05 (×14): qty 1

## 2024-06-05 MED ORDER — PROCHLORPERAZINE EDISYLATE 10 MG/2ML IJ SOLN
5.0000 mg | Freq: Four times a day (QID) | INTRAMUSCULAR | Status: DC | PRN
  Administered 2024-06-05: 5 mg via INTRAVENOUS
  Filled 2024-06-05: qty 2

## 2024-06-05 MED ORDER — ACETAMINOPHEN 650 MG RE SUPP: 650.0000 mg | Freq: Four times a day (QID) | RECTAL | Status: DC | PRN

## 2024-06-05 MED ORDER — LACTATED RINGERS IV BOLUS
1000.0000 mL | Freq: Once | INTRAVENOUS | Status: AC
  Administered 2024-06-05: 1000 mL via INTRAVENOUS

## 2024-06-05 MED ORDER — SODIUM CHLORIDE 0.9 % IV SOLN: INTRAVENOUS | Status: DC

## 2024-06-05 MED ORDER — PANTOPRAZOLE SODIUM 40 MG PO TBEC: 40.0000 mg | DELAYED_RELEASE_TABLET | Freq: Every day | ORAL | Status: DC

## 2024-06-05 MED ORDER — PANTOPRAZOLE SODIUM 40 MG IV SOLR
40.0000 mg | Freq: Two times a day (BID) | INTRAVENOUS | Status: DC
  Administered 2024-06-05 – 2024-06-08 (×7): 40 mg via INTRAVENOUS
  Filled 2024-06-05 (×7): qty 10

## 2024-06-05 NOTE — Assessment & Plan Note (Signed)
 06/05/24 due to AKI, UTI.  06/06/24 resolved.

## 2024-06-05 NOTE — Assessment & Plan Note (Addendum)
 06/05/24 Continue with Cardizem  CD and Lopressor .  06/06/24 was hypotensive yesterday due to GI bleeding. BP better. Hold orders on HTN meds.

## 2024-06-05 NOTE — Subjective & Objective (Signed)
 Patient seen and examined.  She complains of being cold.  Her granddaughter [name] is at the bedside.  Patient has been bedridden since she was discharged to the hospital.  She has foot wounds.  She has a history of UTIs.  Her urine culture from the office dated 05/21/2024 growing out E. coli that is ESBL.  Patient placed on IV meropenem.  No nausea or vomiting now.

## 2024-06-05 NOTE — Assessment & Plan Note (Addendum)
 06/05/24 made DNR on admission

## 2024-06-05 NOTE — ED Notes (Signed)
 Daughter reports pt has been on anbx for UTI, but thinks it is not helping, urine has strong odor as well.

## 2024-06-05 NOTE — ED Triage Notes (Signed)
 Pt bib EMS from home with c/o nausea and vomiting that started this morning. Pt denies any pain at this time.

## 2024-06-05 NOTE — Progress Notes (Signed)
 PROGRESS NOTE    [name]  FMW:[id number] DOB: [birth year] DOA: 06/05/2024 PCP: [name], MD  Subjective: Patient seen and examined.  She complains of being cold.  Her granddaughter [name] is at the bedside.  Patient has been bedridden since she was discharged to the hospital.  She has foot wounds.  She has a history of UTIs.  Her urine culture from the office dated 05/21/2024 growing out E. coli that is ESBL.  Patient placed on IV meropenem.  No nausea or vomiting now.   Hospital Course: CC: N/V, dark urine HPI: [name] is a 89 y.o. female with medical history significant for hypertension, atrial fibrillation on warfarin, symptomatic bradycardia with pacemaker, CKD stage IV, memory loss, anxiety, hypothyroidism, recurrent UTIs, and recent multidrug-resistant organisms who presents with nausea, vomiting, thick, dark, and malodorous urine.    Patient is accompanied by her daughter who assists with the history.  The patient has been on nitrofurantoin  for urinary tract infection but has failed to improve.  She continues to have loss of appetite and then developed nausea with recurrent bouts of vomiting throughout the day yesterday.  Her urine has become more dark, thick, and malodorous despite the antibiotic.  Patient denies any abdominal pain or flank pain.  She felt warm to the touch earlier per report of her daughter, but later felt very cold.   ED Course: Upon arrival to the ED, patient is found to be afebrile and saturating mid 90s on room air with mild tachycardia and SBP in the 90s and greater.  Labs are most notable for BUN 43, creatinine 2.86, alkaline phosphatase 249, albumin  2.5, normal WBC, and turbid brown urine with bacteriuria, pyuria, and nitrites.   Urine was sent for culture and the patient was treated with Zofran  in the ED.  Significant Events: Admitted 06/05/2024 for AKI on CKD stage 4, acute cystitis with hematuria   Admission Labs: WBC 8.4, HgB 11.1,  plt 377 UA brown, turbid, HgB Large, positive nitrite, large LE, WBC >50, RBC 21-50, bacteria many Lipase 39 Na 135, K 4.1, CO2 of 22, BUN 43, scr 2.85, glu 85 T. Prot 5.9, alb 2.5, AST 39, ALT 15, alk phos 249 INR 5.4  Admission Imaging Studies:   Significant Labs:   Significant Imaging Studies:   Antibiotic Therapy: Anti-infectives (From admission, onward)    Start     Dose/Rate Route Frequency Ordered Stop   06/05/24 0600  meropenem (MERREM) 500 mg in sodium chloride  0.9 % 100 mL IVPB        500 mg 200 mL/hr over 30 Minutes Intravenous Every 12 hours 06/05/24 0500         Procedures:   Consultants:     Assessment and Plan: * Acute renal failure superimposed on stage 4 chronic kidney disease (HCC) 06/05/24 likely due to N/V, poor po intake and UTI. Continue IVF.   UTI due to extended-spectrum beta lactamase (ESBL) producing Escherichia coli 06/05/24 continue with IV meropenem based on pt urine cx MIC.   GI bleeding 06/05/24  due to elevated INR. Give FFP. Hold coumadin .  Supratherapeutic INR 06/05/24 INR 5.4. having GI bleeding. Give 2 units FFP.   Vomiting 06/05/24 due to AKI, UTI.   DNR (do not resuscitate) 06/05/24 made DNR on admission   CKD (chronic kidney disease) stage 4, GFR 15-29 ml/min (HCC) - baseline serum creatinine 2-2.4 06/05/24 baseline serum creatinine 2-2.4. Has acute kidney injury on top of CKD stage IV due to UTI and  dehydration.   Chronic anticoagulation 06/05/24 is on warfarin for history of A-fib.  Holding due to elevated INR.   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   Paroxysmal atrial fibrillation (HCC) 06/05/24 continue with Cardizem  and Lopressor .  Hold Coumadin  due to elevated INR.   Essential hypertension 06/05/24 Continue with Cardizem  CD and Lopressor .   Acquired hypothyroidism 06/05/24 continue Synthroid  112 mcg daily.    DVT prophylaxis:   Elevated INR   Code Status: Limited: Do not attempt resuscitation  (DNR) -DNR-LIMITED -Do Not Intubate/DNI  Family Communication: discussed with pt's grand-dtr [name] at bedside Disposition Plan: unknown Reason for continuing need for hospitalization: remains on IV ABX. Giving FFP due to GI bleeding from elevated INR  Objective: Vitals:   06/05/24 0630 06/05/24 0700 06/05/24 1000 06/05/24 1030  BP: 113/79 113/64 (!) 110/53 105/85  Pulse: 94 (!) 104 97 96  Resp: 20 13 14  (!) 23  Temp:      TempSrc:      SpO2: 100% 100% 98% 96%  Weight:      Height:        Intake/Output Summary (Last 24 hours) at 06/05/2024 1048 Last data filed at 06/05/2024 0150 Gross per 24 hour  Intake --  Output 200 ml  Net -200 ml   Filed Weights   06/05/24 0128  Weight: 73.2 kg    Examination:  Physical Exam Vitals and nursing note reviewed.     Data Reviewed: I have personally reviewed following labs and imaging studies  CBC: Recent Labs  Lab 06/05/24 0210  WBC 8.4  NEUTROABS 4.3  HGB 11.1*  HCT 35.8*  MCV 96.8  PLT 377   Basic Metabolic Panel: Recent Labs  Lab 06/05/24 0250  NA 135  K 4.1  CL 103  CO2 22  GLUCOSE 85  BUN 43*  CREATININE 2.85*  CALCIUM 9.0   GFR: Estimated Creatinine Clearance: 12.1 mL/min (A) (by C-G formula based on SCr of 2.85 mg/dL (H)). Liver Function Tests: Recent Labs  Lab 06/05/24 0250  AST 39  ALT 15  ALKPHOS 249*  BILITOT 0.5  PROT 5.9*  ALBUMIN  2.5*   Recent Labs  Lab 06/05/24 0250  LIPASE 39   Coagulation Profile: Recent Labs  Lab 06/05/24 0250  INR 5.4*   Scheduled Meds:  sodium chloride    Intravenous Once   busPIRone   7.5 mg Oral BID   diltiazem   240 mg Oral Daily   donepezil   10 mg Oral QHS   levothyroxine   112 mcg Oral QAC breakfast   metoprolol  tartrate  25 mg Oral BID   pantoprazole  (PROTONIX ) IV  40 mg Intravenous Q12H   ursodiol   300 mg Oral TID   Warfarin - Pharmacist Dosing Inpatient   Does not apply q1600   Continuous Infusions:  sodium chloride  100 mL/hr at 06/05/24 0552    meropenem (MERREM) IV 500 mg (06/05/24 0913)     LOS: 0 days   Time spent: 60 minutes  Camellia Door, DO  [facility]  06/05/2024, 10:48 AM

## 2024-06-05 NOTE — Consult Note (Addendum)
 WOC Consult requested for bilat feet.  Secure chat message sent to the primary physician as follows: Please note that the [facility] nursing team is utilizing a standardized work plan to manage patient consults. We are triaging consults and will try to see the patients within 48 hours. Wound photos in the patient's chart allow us  to consult on the patient in the most efficient and timely manner. Thank-you,  Stephane Fought MSN, RN, CWOCN, CWCN-AP, CNS Contact Mon-Fri 0700-1500: [phone]

## 2024-06-05 NOTE — Progress Notes (Signed)
 PHARMACY - ANTICOAGULATION CONSULT NOTE  Pharmacy Consult for heparin Indication: atrial fibrillation  No Known Allergies  Patient Measurements: Height: 5' 9 (175.3 cm) Weight: 73.2 kg (161 lb 6 oz) IBW/kg (Calculated) : 66.2 HEPARIN DW (KG): 73.2  Vital Signs: Temp: 97.7 F (36.5 C) (10/09 0133) Temp Source: Oral (10/09 0133) BP: 104/69 (10/09 0600) Pulse Rate: 114 (10/09 0600)  Labs: Recent Labs    06/05/24 0210 06/05/24 0250  HGB 11.1*  --   HCT 35.8*  --   PLT 377  --   CREATININE  --  2.85*    Estimated Creatinine Clearance: 12.1 mL/min (A) (by C-G formula based on SCr of 2.85 mg/dL (H)).   Medical History: Past Medical History:  Diagnosis Date   Allergy    Annual physical exam 04/22/2015   Anxiety    Arthritis    Arthritis    Asthma    Atrial fibrillation (HCC)    Bleeding disorder    CHF (congestive heart failure) (HCC)    CKD (chronic kidney disease) stage 4, GFR 15-29 ml/min (HCC) 09/08/2021   COPD (chronic obstructive pulmonary disease) (HCC)    Depression    Gastroesophageal reflux disease    Hearing impairment    Heart disease    Heart murmur    High cholesterol    Hyperlipidemia    Hypertension    Hypothyroidism    Impaired glucose tolerance    Macular degeneration    Oxygen  deficiency    Pancreatitis    Paroxysmal atrial fibrillation (HCC) 2011   Sick sinus syndrome (HCC) 2011   Atrial fibrilation 10/2009; and dual chamber pacemaker in 4/11; normal EF   Sleep apnea    Nocturnal oxygen  therapy   Zoster 2016    Medications:  (Not in a hospital admission)  Scheduled:   busPIRone   7.5 mg Oral BID   diltiazem   240 mg Oral Daily   donepezil   10 mg Oral QHS   levothyroxine   112 mcg Oral QAC breakfast   metoprolol  tartrate  25 mg Oral BID   pantoprazole   40 mg Oral Daily   ursodiol   300 mg Oral TID   Warfarin - Pharmacist Dosing Inpatient   Does not apply q1600   Infusions:   sodium chloride  100 mL/hr at 06/05/24 0552    meropenem (MERREM) IV      Assessment: 89yo female admitted for AKI on CKD and recurrent UTI (MDR E.coli on 9/24 cx, was pan-sensitive E.coli on 9/4), to continue warfarin for PAF; home warfarin dose 2.5mg  daily.  Goal of Therapy:  INR 2-3   Plan:  Monitor INR for dose adjustment.  Marvetta Dauphin, PharmD, BCPS  06/05/2024,6:04 AM

## 2024-06-05 NOTE — ED Notes (Signed)
 Pt had a second moderate sized BM which was mainly dark red blood. Dr.Chen was made aware.

## 2024-06-05 NOTE — ED Provider Notes (Signed)
 Post EMERGENCY DEPARTMENT AT [facility]  Provider Note  CSN: [id number] Arrival date & time: 06/05/24 0124  History Chief Complaint  Patient presents with   Emesis    ONDREA DOW is a 89 y.o. female with history of afib on coumadin  with PPM, HTN, pancreatitis, GERD, and CKD brought to the ED via EMS from home for evaluation of vomiting all day long today. EMS reports family was concerned about low BP, but normal for them. She is unable to provide any additional history. Admitted for similar about a month ago with UTI and AKI. She denies pain.    Home Medications Prior to Admission medications   Medication Sig Start Date End Date Taking? Authorizing Provider  acetaminophen  (TYLENOL ) 325 MG tablet Take 2 tablets (650 mg total) by mouth every 6 (six) hours as needed for mild pain (pain score 1-3) or fever (or Fever >/= 101). 05/05/24   Emokpae, Courage, MD  busPIRone  (BUSPAR ) 7.5 MG tablet Take 1 tablet (7.5 mg total) by mouth 2 (two) times daily. 05/05/24   Pearlean Manus, MD  cefpodoxime  (VANTIN ) 100 MG tablet Take 1 tablet (100 mg total) by mouth 2 (two) times daily. 05/20/24   Tobie Suzzane POUR, MD  diltiazem  (CARDIZEM  CD) 240 MG 24 hr capsule Take 1 capsule (240 mg total) by mouth daily. 05/05/24   Pearlean Manus, MD  donepezil  (ARICEPT ) 10 MG tablet Take 1 tablet (10 mg total) by mouth at bedtime. 05/05/24   Pearlean Manus, MD  ferrous sulfate  325 (65 FE) MG tablet Take 1 tablet (325 mg total) by mouth every Tuesday, Thursday, Saturday, and Sunday. 05/06/24   Pearlean Manus, MD  levothyroxine  (SYNTHROID ) 112 MCG tablet Take 1 tablet (112 mcg total) by mouth daily before breakfast. 05/05/24   Pearlean Manus, MD  metoprolol  tartrate (LOPRESSOR ) 25 MG tablet Take 1 tablet (25 mg total) by mouth 2 (two) times daily. Hold if SBP < 100 mmhg 05/05/24   Emokpae, Courage, MD  nitrofurantoin , macrocrystal-monohydrate, (MACROBID ) 100 MG capsule Take 1 capsule (100 mg total) by mouth at  bedtime. 05/28/24   Tobie Suzzane POUR, MD  nystatin  (MYCOSTATIN /NYSTOP ) powder Apply topically 3 (three) times daily. 05/05/24   Pearlean Manus, MD  nystatin  cream (MYCOSTATIN ) Apply topically 2 (two) times daily. Apply to affected areas twice daily 05/05/24   Emokpae, Courage, MD  omeprazole  (PRILOSEC) 40 MG capsule Take 1 capsule (40 mg total) by mouth daily. 05/05/24   Pearlean Manus, MD  ursodiol  (ACTIGALL ) 300 MG capsule Take 1 capsule (300 mg total) by mouth 3 (three) times daily. 04/03/24 07/02/24  Willette Adriana LABOR, MD  benzonatate  (TESSALON  PERLES) 100 MG capsule Take 1 capsule (100 mg total) by mouth every 6 (six) hours as needed for cough. 02/05/13 11/04/18  [name], MD     Allergies    Patient has no known allergies.   Review of Systems   Review of Systems Please see HPI for pertinent positives and negatives  Physical Exam BP 104/62   Pulse 84   Temp 97.7 F (36.5 C) (Oral)   Resp 17   Ht 5' 9 (1.753 m)   Wt 73.2 kg   SpO2 94%   BMI 23.83 kg/m   Physical Exam Vitals and nursing note reviewed.  Constitutional:      Appearance: Normal appearance.  HENT:     Head: Normocephalic and atraumatic.     Nose: Nose normal.     Mouth/Throat:     Mouth: Mucous  membranes are moist.  Eyes:     Extraocular Movements: Extraocular movements intact.     Conjunctiva/sclera: Conjunctivae normal.  Cardiovascular:     Rate and Rhythm: Normal rate.  Pulmonary:     Effort: Pulmonary effort is normal.     Breath sounds: Normal breath sounds.  Abdominal:     General: Abdomen is flat.     Palpations: Abdomen is soft.     Tenderness: There is no abdominal tenderness.  Musculoskeletal:        General: No swelling. Normal range of motion.     Cervical back: Neck supple.  Skin:    General: Skin is warm and dry.  Neurological:     General: No focal deficit present.     Mental Status: She is alert. Mental status is at baseline.  Psychiatric:        Mood and Affect: Mood  normal.     ED Results / Procedures / Treatments   EKG EKG Interpretation Date/Time:  Thursday June 05 2024 01:56:19 EDT Ventricular Rate:  117 PR Interval:    QRS Duration:  194 QT Interval:  420 QTC Calculation: 584 R Axis:   262  Text Interpretation: Atrial fibrillation RBBB and LAFB Repol abnrm suggests ischemia, diffuse leads No significant change since last tracing Confirmed by Roselyn Dunnings [phone]) on 06/05/2024 1:58:31 AM  Procedures Procedures  Medications Ordered in the ED Medications  ondansetron  (ZOFRAN ) injection 4 mg (4 mg Intravenous Given 06/05/24 0208)    Initial Impression and Plan  Patient here with 1 day of nausea vomiting. No reported fevers or pain. Will check labs and given Zofran .   ED Course   Clinical Course as of 06/05/24 0423  Thu Jun 05, 2024  0226 CBC is unremarkable.  [CS]  0334 CMP with CKD about at previous baseline. Lipase is neg. UA with continued signs of infection.  [CS]  0335 Urine culture from recent admission showed Klebsiella, E.Coli and Enterococcus, treated with rocephin  then Keflex  as well as fosfomycin. Follow up culture on 9/24 showed MDR E coli treated with macrobid . Will discuss with Hospitalist for admission.  [CS]  0411 Spoke with Dr. Charlton, Hospitalist, who will evaluate for admission. Given complex MRD E coli on recent culture, he recommends starting meropenem.  [CS]    Clinical Course User Index [CS] Roselyn Dunnings NOVAK, MD     MDM Rules/Calculators/A&P Medical Decision Making Problems Addressed: Acute cystitis with hematuria: acute illness or injury Nausea and vomiting, unspecified vomiting type: acute illness or injury  Amount and/or Complexity of Data Reviewed Labs: ordered. Decision-making details documented in ED Course.  Risk Prescription drug management. Decision regarding hospitalization.     Final Clinical Impression(s) / ED Diagnoses Final diagnoses:  Nausea and vomiting, unspecified vomiting  type  Acute cystitis with hematuria    Rx / DC Orders ED Discharge Orders     None        Roselyn Dunnings NOVAK, MD 06/05/24 [phone]

## 2024-06-05 NOTE — Hospital Course (Addendum)
 89 y.o. female with medical history significant for hypertension, atrial fibrillation on warfarin, symptomatic bradycardia with pacemaker, CKD stage IV, memory loss, anxiety, hypothyroidism, recurrent UTIs, and recent multidrug-resistant organisms who presents with nausea, vomiting, thick, dark, and malodorous urine.    Patient is accompanied by her daughter who assists with the history.  The patient has been on nitrofurantoin  for urinary tract infection but has failed to improve.  She continues to have loss of appetite and then developed nausea with recurrent bouts of vomiting throughout the day yesterday.  Her urine has become more dark, thick, and malodorous despite the antibiotic.  Patient denies any abdominal pain or flank pain.  She felt warm to the touch earlier per report of her daughter, but later felt very cold.   ED Course: Upon arrival to the ED, patient is found to be afebrile and saturating mid 90s on room air with mild tachycardia and SBP in the 90s and greater.  Labs are most notable for BUN 43, creatinine 2.86, alkaline phosphatase 249, albumin  2.5, normal WBC, and turbid brown urine with bacteriuria, pyuria, and nitrites.   Urine was sent for culture and the patient was treated with Zofran  in the ED.  Significant Events: Admitted 06/05/2024 for AKI on CKD stage 4, acute cystitis with hematuria

## 2024-06-05 NOTE — ED Notes (Signed)
 Pt bad a bloody BM. Pt cleaned up and changed into clean brief. Stool sample tested on occult card. Sample is positive. Rectal  temp also 96.8. MD made aware

## 2024-06-05 NOTE — Consult Note (Addendum)
 WOC Nurse Consult Note: Consult requested for bilat feet.  Performed remotely after review of progress notes and photos in the EMR.  Pt is familiar to [facility] team from a recent admission on 9/6.   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  Right heel with approx 90% moist eschar and a loose piece of nonviable tissue to the outer edge, 10% red  Left heel with approx 80% moist eschar, 20% red  Prevalon boots to reduce pressure.  Topical treatment orders provided for bedside nurses to perform as follows: Change bilat heel dressings Q day as follows; lightly scrub with moist gauze to assist with removal of loose tissue. Apply Medihoney to bilat heels Q day, then cover with foam dressing.  Change foam dressing q 3 days or PRN soiling.        Please re-consult if further assistance is needed.  Thank-you,   Stephane Fought MSN, RN, CWOCN, CWCN-AP, CNS Contact Mon-Fri 0700-1500: [phone]

## 2024-06-05 NOTE — Assessment & Plan Note (Addendum)
 06/05/24 likely due to N/V, poor po intake and UTI. Continue IVF.  06/06/24 Scr down to 2.54. was admitted with Scr of 2.85.  baseline Scr 2.5. stop IVF. Family to encourage her to drink and eat.

## 2024-06-05 NOTE — Progress Notes (Signed)
 Blood slip documentation missing. Attempted to call ED to speak with nurse, but the call was not answered. Only completion time documented. Blood was transfusing upon arrival

## 2024-06-05 NOTE — ED Notes (Signed)
 Pts INR 5.4. MD made aware.  Per. Dr.opyd, hold warfarin, check H&H again later today, and repeat INR tomorrow

## 2024-06-05 NOTE — TOC Initial Note (Signed)
 Transition of Care [facility]) - Initial/Assessment Note    Patient Details  Name: [name] MRN: [id number] Date of Birth: 04/12/1926  Transition of Care [facility] CM/SW Contact:    Lucie Lunger, LCSWA Phone Number: 06/05/2024, 9:27 AM  Clinical Narrative:                 Pt is high risk for readmission. CSW spoke to pts granddaughter [name] who is at bedside with pt to complete assessment. Pt lives with her daughter. Pt has assistance with ADLs by daughter and granddaughter. Pt has HH RN/OT with [facility]. Pt has a hospital bed, EZ lift, lift chair and wheel chair to use when needed. Pt has outpatient palliative services with [facility]. Pt wears O2 at night. TOC to follow.   Expected Discharge Plan: Home w Home Health Services Barriers to Discharge: Continued Medical Work up   Patient Goals and CMS Choice Patient states their goals for this hospitalization and ongoing recovery are:: return home CMS Medicare.gov Compare Post Acute Care list provided to:: Patient Represenative (must comment) Choice offered to / list presented to : Adult Children      Expected Discharge Plan and Services In-house Referral: Clinical Social Work Discharge Planning Services: CM Consult Post Acute Care Choice: Home Health Living arrangements for the past 2 months: Single Family Home                                      Prior Living Arrangements/Services Living arrangements for the past 2 months: Single Family Home Lives with:: Adult Children Patient language and need for interpreter reviewed:: Yes Do you feel safe going back to the place where you live?: Yes      Need for Family Participation in Patient Care: Yes (Comment) Care giver support system in place?: Yes (comment) Current home services: DME Criminal Activity/Legal Involvement Pertinent to Current Situation/Hospitalization: No - Comment as needed  Activities of Daily Living      Permission Sought/Granted                   Emotional Assessment Appearance:: Appears stated age       Alcohol / Substance Use: Not Applicable Psych Involvement: No (comment)  Admission diagnosis:  Acute renal failure superimposed on stage 4 chronic kidney disease (HCC) [N17.9, N18.4] Patient Active Problem List   Diagnosis Date Noted   Acute renal failure superimposed on stage 4 chronic kidney disease (HCC) 06/05/2024   DNR (do not resuscitate) 06/05/2024   Vomiting 05/02/2024   Atrial fibrillation, chronic (HCC) 05/02/2024   Supratherapeutic INR 05/02/2024   UTI due to extended-spectrum beta lactamase (ESBL) producing Escherichia coli 05/01/2024   Muscle wasting and atrophy, not elsewhere classified, right upper arm 04/07/2024   Cognitive communication deficit 04/04/2024   Muscle weakness (generalized) 04/04/2024   Unsteady gait when walking 04/04/2024   Chronic diastolic heart failure (HCC) 04/03/2024   Depression 04/03/2024   History of recurrent UTIs 04/03/2024   Insomnia, unspecified 04/03/2024   Iron deficiency anemia due to chronic blood loss 04/03/2024   Macular degeneration 04/03/2024   Loss of appetite 03/28/2024   Acute kidney injury superimposed on stage 5 chronic kidney disease, not on chronic dialysis (HCC) 03/27/2024   Chronic constipation 03/19/2024   Dermatomycosis 06/03/2023   Vitamin D  deficiency 09/07/2022   Dermatitis 04/24/2022   CKD (chronic kidney disease) stage 4, GFR 15-29 ml/min (HCC) 09/08/2021  Skin lesion of face 02/24/2021   Recurrent UTI 08/14/2019   Generalized weakness 07/04/2019   Vertigo 05/18/2019   Nocturnal hypoxia 12/03/2016   Osteopenia 10/18/2016   At high risk for falls 10/21/2015   Seasonal allergies 05/05/2014   GERD (gastroesophageal reflux disease) 05/05/2014   Sick sinus syndrome (HCC)    Chronic anticoagulation 11/18/2010   Impaired fasting glucose 10/14/2010   Paroxysmal atrial fibrillation (HCC) 06/14/2010   PPM-St.Jude 03/30/2010   GAD (generalized  anxiety disorder) 12/05/2009   Impaired memory 03/03/2009   Acquired hypothyroidism 02/25/2008   Mixed hyperlipidemia 02/25/2008   Hearing loss 02/25/2008   Essential hypertension 02/25/2008   PCP:  [name], MD Pharmacy:   [facility] [place], [place] - [address] [place] [address] Phone: [phone] Fax: [phone]     Social Drivers of Health (SDOH) Social History: SDOH Screenings   Food Insecurity: No Food Insecurity (05/02/2024)  Housing: Low Risk  (05/02/2024)  Transportation Needs: No Transportation Needs (05/02/2024)  Utilities: Not At Risk (05/02/2024)  Alcohol Screen: Low Risk  (09/04/2023)  Depression (PHQ2-9): Low Risk  (03/19/2024)  Financial Resource Strain: Low Risk  (09/04/2023)  Physical Activity: Inactive (09/04/2023)  Social Connections: Socially Isolated (05/02/2024)  Stress: No Stress Concern Present (09/04/2023)  Tobacco Use: Low Risk  (06/05/2024)  Health Literacy: High Risk (04/10/2024)   Received from [facility]   SDOH Interventions:     Readmission Risk Interventions    06/05/2024    9:26 AM 05/02/2024    1:10 PM 05/02/2024   11:14 AM  Readmission Risk Prevention Plan  Transportation Screening Complete Complete Complete  PCP or Specialist Appt within 3-5 Days  Complete Complete  HRI or Home Care Consult Complete Complete Complete  Social Work Consult for Recovery Care Planning/Counseling Complete Complete Complete  Palliative Care Screening Not Applicable Not Applicable Complete  Medication Review Oceanographer) Complete Complete Complete

## 2024-06-05 NOTE — Assessment & Plan Note (Addendum)
 06/05/24 continue with Cardizem  and Lopressor .  Hold Coumadin  due to elevated INR.  06/06/24 discussed with pt, dtr and both grand-dtrs. Will stop coumadin . Discussed Pro/Con of continue systemic anticoagulation with Eliquis vs ASA only. They will think about it. I have advised pt stop systemic anticoagulation given her advanced age of 35 and just remain on ASA 81 mg daily. Family to discuss amongst themselves and let me know in the AM. Continue cardizem  CD and lopressor .

## 2024-06-05 NOTE — Assessment & Plan Note (Addendum)
 06/05/24 chronic.  06/06/24 stable.

## 2024-06-05 NOTE — Assessment & Plan Note (Signed)
 06/05/24 continue with IV meropenem based on pt urine cx MIC.  06/06/24 continue with IV meropenem Day #2. Urine cx growing E. Coli and Klebsiella.

## 2024-06-05 NOTE — ED Notes (Signed)
 Dr.Chen at bedside to give family update

## 2024-06-05 NOTE — Assessment & Plan Note (Signed)
 06/05/24  due to elevated INR. Give FFP. Hold coumadin .  06/06/24 s/p 1 unit PRBC transfusion yesterday due to blood loss and hypotension. BP better today.

## 2024-06-05 NOTE — Assessment & Plan Note (Signed)
 06/05/24 INR 5.4. having GI bleeding. Give 2 units FFP.  06/06/24 INR still 2.9 today after 2 units FFP. Will give 5 mg IV vitamin K. Discussed with family that I am going to stop coumadin  altogether.

## 2024-06-05 NOTE — ED Notes (Signed)
 Pts bp now 80s systolic. Dr.Chen made aware. Verbal order to give ffp at 999ml/hr.

## 2024-06-05 NOTE — Assessment & Plan Note (Addendum)
 06/05/24 is on warfarin for history of A-fib.  Holding due to elevated INR.  06/06/24 will DC coumadin  altogether.  Discussed Pro/Con of continue systemic anticoagulation with Eliquis vs ASA only. They will think about it. I have advised pt stop systemic anticoagulation given her advanced age of 73 and just remain on ASA 81 mg daily. Family to discuss amongst themselves and let me know in the AM.

## 2024-06-05 NOTE — Assessment & Plan Note (Addendum)
 06/05/24 continue Synthroid  112 mcg daily.  06/06/24 stable

## 2024-06-05 NOTE — ED Notes (Signed)
 Dr.Chen made aware of pts bp being in 80s systolic after 2U ffp

## 2024-06-05 NOTE — H&P (Addendum)
 History and Physical    RHAELYN GIRON FMW:[id number] DOB: 10/10/1925 DOA: 06/05/2024  PCP: [name], MD   Patient coming from: Home   Chief Complaint: N/V, thick dark urine     HPI: [name] is a 89 y.o. female with medical history significant for hypertension, atrial fibrillation on warfarin, symptomatic bradycardia with pacemaker, CKD stage IV, memory loss, anxiety, hypothyroidism, recurrent UTIs, and recent multidrug-resistant organisms who presents with nausea, vomiting, thick, dark, and malodorous urine.   Patient is accompanied by her daughter who assists with the history.  The patient has been on nitrofurantoin  for urinary tract infection but has failed to improve.  She continues to have loss of appetite and then developed nausea with recurrent bouts of vomiting throughout the day yesterday.  Her urine has become more dark, thick, and malodorous despite the antibiotic.  Patient denies any abdominal pain or flank pain.  She felt warm to the touch earlier per report of her daughter, but later felt very cold.  ED Course: Upon arrival to the ED, patient is found to be afebrile and saturating mid 90s on room air with mild tachycardia and SBP in the 90s and greater.  Labs are most notable for BUN 43, creatinine 2.86, alkaline phosphatase 249, albumin  2.5, normal WBC, and turbid brown urine with bacteriuria, pyuria, and nitrites.  Urine was sent for culture and the patient was treated with Zofran  in the ED.  Review of Systems:  All other systems reviewed and apart from HPI, are negative.  Past Medical History:  Diagnosis Date   Allergy    Annual physical exam 04/22/2015   Anxiety    Arthritis    Arthritis    Asthma    Atrial fibrillation (HCC)    Bleeding disorder    CHF (congestive heart failure) (HCC)    CKD (chronic kidney disease) stage 4, GFR 15-29 ml/min (HCC) 09/08/2021   COPD (chronic obstructive pulmonary disease) (HCC)    Depression    Gastroesophageal  reflux disease    Hearing impairment    Heart disease    Heart murmur    High cholesterol    Hyperlipidemia    Hypertension    Hypothyroidism    Impaired glucose tolerance    Macular degeneration    Oxygen  deficiency    Pancreatitis    Paroxysmal atrial fibrillation (HCC) 2011   Sick sinus syndrome (HCC) 2011   Atrial fibrilation 10/2009; and dual chamber pacemaker in 4/11; normal EF   Sleep apnea    Nocturnal oxygen  therapy   Zoster 2016    Past Surgical History:  Procedure Laterality Date   ABDOMINAL HYSTERECTOMY     CATARACT EXTRACTION     Bilateral   COLONOSCOPY  08/29/2007   Dr. Shaaron   EYE SURGERY     laser    PACEMAKER INSERTION  08/28/2009   dual chamber    PPM GENERATOR CHANGEOUT N/A 11/13/2016   Procedure: PPM Generator Changeout;  Surgeon: Waddell Danelle ORN, MD;  Location: [facility];  Service: Cardiovascular;  Laterality: N/A;   SALPINGOOPHORECTOMY  08/28/1968   right    Social History:   reports that she has never smoked. She has never been exposed to tobacco smoke. She has never used smokeless tobacco. She reports that she does not drink alcohol and does not use drugs.  No Known Allergies  Family History  Problem Relation Age of Onset   Cancer Mother        gential  Cancer Father        throat    Pneumonia Sister    Emphysema Sister    Mitral valve prolapse Sister    Heart disease Other    Arthritis Other    Lung disease Other    Asthma Other    Melanoma Son    Anxiety disorder Daughter    Arthritis Daughter    Asthma Daughter    COPD Daughter    Depression Daughter    Kidney disease Daughter    Obesity Daughter    Anxiety disorder Daughter    Anxiety disorder Daughter    Arthritis Daughter      Prior to Admission medications   Medication Sig Start Date End Date Taking? Authorizing Provider  acetaminophen  (TYLENOL ) 325 MG tablet Take 2 tablets (650 mg total) by mouth every 6 (six) hours as needed for mild pain (pain score  1-3) or fever (or Fever >/= 101). 05/05/24   Emokpae, Courage, MD  busPIRone  (BUSPAR ) 7.5 MG tablet Take 1 tablet (7.5 mg total) by mouth 2 (two) times daily. 05/05/24   Pearlean Manus, MD  diltiazem  (CARDIZEM  CD) 240 MG 24 hr capsule Take 1 capsule (240 mg total) by mouth daily. 05/05/24   Pearlean Manus, MD  donepezil  (ARICEPT ) 10 MG tablet Take 1 tablet (10 mg total) by mouth at bedtime. 05/05/24   Pearlean Manus, MD  ferrous sulfate  325 (65 FE) MG tablet Take 1 tablet (325 mg total) by mouth every Tuesday, Thursday, Saturday, and Sunday. 05/06/24   Pearlean Manus, MD  levothyroxine  (SYNTHROID ) 112 MCG tablet Take 1 tablet (112 mcg total) by mouth daily before breakfast. 05/05/24   Pearlean Manus, MD  metoprolol  tartrate (LOPRESSOR ) 25 MG tablet Take 1 tablet (25 mg total) by mouth 2 (two) times daily. Hold if SBP < 100 mmhg 05/05/24   Emokpae, Courage, MD  nystatin  (MYCOSTATIN /NYSTOP ) powder Apply topically 3 (three) times daily. 05/05/24   Pearlean Manus, MD  nystatin  cream (MYCOSTATIN ) Apply topically 2 (two) times daily. Apply to affected areas twice daily 05/05/24   Emokpae, Courage, MD  omeprazole  (PRILOSEC) 40 MG capsule Take 1 capsule (40 mg total) by mouth daily. 05/05/24   Pearlean Manus, MD  ursodiol  (ACTIGALL ) 300 MG capsule Take 1 capsule (300 mg total) by mouth 3 (three) times daily. 04/03/24 07/02/24  ShahmehdiAdriana LABOR, MD  benzonatate  (TESSALON  PERLES) 100 MG capsule Take 1 capsule (100 mg total) by mouth every 6 (six) hours as needed for cough. 02/05/13 11/04/18  [name], MD    Physical Exam: Vitals:   06/05/24 0240 06/05/24 0250 06/05/24 0300 06/05/24 0330  BP: (!) 105/56 107/84 91/64 104/62  Pulse: 93 (!) 108 93 84  Resp: 19 18 (!) 22 17  Temp:      TempSrc:      SpO2: 96% 95% 94% 94%  Weight:      Height:        Constitutional: NAD, no pallor or diaphoresis   Eyes: PERTLA, lids and conjunctivae normal ENMT: Mucous membranes are moist. Posterior pharynx clear of  any exudate or lesions.   Neck: supple, no masses  Respiratory: no wheezing, no crackles. No accessory muscle use.  Cardiovascular: S1 & S2 heard, regular rate and rhythm. No JVD. Abdomen: No tenderness, soft. Bowel sounds active.  Musculoskeletal: no clubbing / cyanosis. No joint deformity upper and lower extremities.   Skin: no significant rashes, lesions, ulcers. Warm, dry, well-perfused. Neurologic: CN 2-12 grossly intact. Moving all extremities. Alert and  oriented to person and place.  Psychiatric: Calm. Cooperative.    Labs and Imaging on Admission: I have personally reviewed following labs and imaging studies  CBC: Recent Labs  Lab 06/05/24 0210  WBC 8.4  NEUTROABS 4.3  HGB 11.1*  HCT 35.8*  MCV 96.8  PLT 377   Basic Metabolic Panel: Recent Labs  Lab 06/05/24 0250  NA 135  K 4.1  CL 103  CO2 22  GLUCOSE 85  BUN 43*  CREATININE 2.85*  CALCIUM 9.0   GFR: Estimated Creatinine Clearance: 12.1 mL/min (A) (by C-G formula based on SCr of 2.85 mg/dL (H)). Liver Function Tests: Recent Labs  Lab 06/05/24 0250  AST 39  ALT 15  ALKPHOS 249*  BILITOT 0.5  PROT 5.9*  ALBUMIN  2.5*   Recent Labs  Lab 06/05/24 0250  LIPASE 39   No results for input(s): AMMONIA in the last 168 hours. Coagulation Profile: No results for input(s): INR, PROTIME in the last 168 hours. Cardiac Enzymes: No results for input(s): CKTOTAL, CKMB, CKMBINDEX, TROPONINI in the last 168 hours. BNP (last 3 results) No results for input(s): PROBNP in the last 8760 hours. HbA1C: No results for input(s): HGBA1C in the last 72 hours. CBG: No results for input(s): GLUCAP in the last 168 hours. Lipid Profile: No results for input(s): CHOL, HDL, LDLCALC, TRIG, CHOLHDL, LDLDIRECT in the last 72 hours. Thyroid  Function Tests: No results for input(s): TSH, T4TOTAL, FREET4, T3FREE, THYROIDAB in the last 72 hours. Anemia Panel: No results for input(s):  VITAMINB12, FOLATE, FERRITIN, TIBC, IRON, RETICCTPCT in the last 72 hours. Urine analysis:    Component Value Date/Time   COLORURINE BROWN (A) 06/05/2024 0148   APPEARANCEUR TURBID (A) 06/05/2024 0148   APPEARANCEUR Cloudy (A) 05/21/2024 1500   LABSPEC 1.020 06/05/2024 0148   PHURINE 6.5 06/05/2024 0148   GLUCOSEU 100 (A) 06/05/2024 0148   HGBUR LARGE (A) 06/05/2024 0148   HGBUR trace-lysed 01/11/2010 1458   BILIRUBINUR MODERATE (A) 06/05/2024 0148   BILIRUBINUR Negative 05/21/2024 1500   KETONESUR 15 (A) 06/05/2024 0148   PROTEINUR >300 (A) 06/05/2024 0148   UROBILINOGEN 0.2 05/25/2022 1147   UROBILINOGEN 0.2 02/09/2015 1715   NITRITE POSITIVE (A) 06/05/2024 0148   LEUKOCYTESUR LARGE (A) 06/05/2024 0148   Sepsis Labs: @LABRCNTIP (procalcitonin:4,lacticidven:4) )No results found for this or any previous visit (from the past 240 hours).   Radiological Exams on Admission: No results found.  EKG: Independently reviewed. Atrial fibrillation, RBBB, LAFB.   Assessment/Plan   1. AKI superimposed on CKD IV  - SCr is 2.86 on admission; baseline appears to be closer to 2  - Likely prerenal in setting of recent anorexia and then N/V yesterday  - Renally-dose medications, start IVF hydration, repeat chem panel    2. Recurrent UTI  - Recent organisms cultured from urine include VRE, Klebsiella resistant to nitrofurantoin , and E coli resistant to 3rd and 4th gen cephalosporins, FQs, and Bactrim   - Treat with meropenem for now, follow culture and clinical course   3. Nausea and vomiting  - She denies abdominal pain and exam is benign   - Continue IVF hydration, monitor/correct electrolytes, antiemetics as-needed   4. PAF  - Continue warfarin, diltiazem , metoprolol    Addendum (06:40): patient has blood in stool this morning and INR is 5.4. Plan to hold warfarin, repeat H&H in a few hours, follow daily INR, and consider vit K and/or FFP if bleeding becomes severe.     5.  Anxiety; dementia  - Continue Aricept   and Buspar , use delirium precautions    6. Hypothyroidism  - Synthroid     DVT prophylaxis: warfarin  Code Status: DNR/DNI  Level of Care: Level of care: Med-Surg Family Communication: Daughter at bedside  Disposition Plan:  Patient is from: home  Anticipated d/c is to: TBD Anticipated d/c date is: 06/08/24  Patient currently: Pending urine culture, stable renal function  Consults called: None  Admission status: Inpatient     Evalene GORMAN Sprinkles, MD [facility]  06/05/2024, 4:46 AM

## 2024-06-05 NOTE — ED Notes (Signed)
 Attempt to call report x1, instructed that room wasn't clean

## 2024-06-05 NOTE — Assessment & Plan Note (Addendum)
 06/05/24 baseline serum creatinine 2-2.4. Has acute kidney injury on top of CKD stage IV due to UTI and dehydration.  06/06/24 stable

## 2024-06-06 DIAGNOSIS — Z7901 Long term (current) use of anticoagulants: Secondary | ICD-10-CM

## 2024-06-06 DIAGNOSIS — N390 Urinary tract infection, site not specified: Secondary | ICD-10-CM | POA: Diagnosis not present

## 2024-06-06 DIAGNOSIS — L89609 Pressure ulcer of unspecified heel, unspecified stage: Secondary | ICD-10-CM | POA: Insufficient documentation

## 2024-06-06 DIAGNOSIS — K922 Gastrointestinal hemorrhage, unspecified: Secondary | ICD-10-CM | POA: Diagnosis not present

## 2024-06-06 DIAGNOSIS — R791 Abnormal coagulation profile: Secondary | ICD-10-CM | POA: Diagnosis not present

## 2024-06-06 DIAGNOSIS — N179 Acute kidney failure, unspecified: Secondary | ICD-10-CM | POA: Diagnosis not present

## 2024-06-06 LAB — PREPARE FRESH FROZEN PLASMA

## 2024-06-06 LAB — BPAM FFP
Blood Product Expiration Date: 202510142359
Blood Product Expiration Date: 202510142359
ISSUE DATE / TIME: 202510091302
ISSUE DATE / TIME: 202510091339
Unit Type and Rh: 7300
Unit Type and Rh: 7300

## 2024-06-06 LAB — PROTIME-INR
INR: 2.9 — ABNORMAL HIGH (ref 0.8–1.2)
Prothrombin Time: 32.1 s — ABNORMAL HIGH (ref 11.4–15.2)

## 2024-06-06 LAB — BASIC METABOLIC PANEL WITH GFR
Anion gap: 9 (ref 5–15)
BUN: 38 mg/dL — ABNORMAL HIGH (ref 8–23)
CO2: 21 mmol/L — ABNORMAL LOW (ref 22–32)
Calcium: 8.4 mg/dL — ABNORMAL LOW (ref 8.9–10.3)
Chloride: 104 mmol/L (ref 98–111)
Creatinine, Ser: 2.57 mg/dL — ABNORMAL HIGH (ref 0.44–1.00)
GFR, Estimated: 17 mL/min — ABNORMAL LOW (ref >60)
Glucose, Bld: 74 mg/dL (ref 70–99)
Potassium: 3.8 mmol/L (ref 3.5–5.1)
Sodium: 135 mmol/L (ref 135–145)

## 2024-06-06 LAB — CBC
HCT: 31.6 % — ABNORMAL LOW (ref 36.0–46.0)
Hemoglobin: 10 g/dL — ABNORMAL LOW (ref 12.0–15.0)
MCH: 30.7 pg (ref 26.0–34.0)
MCHC: 31.6 g/dL (ref 30.0–36.0)
MCV: 96.9 fL (ref 80.0–100.0)
Platelets: 300 K/uL (ref 150–400)
RBC: 3.26 MIL/uL — ABNORMAL LOW (ref 3.87–5.11)
RDW: 15.5 % (ref 11.5–15.5)
WBC: 6.9 K/uL (ref 4.0–10.5)
nRBC: 0 % (ref 0.0–0.2)

## 2024-06-06 LAB — HEPATIC FUNCTION PANEL
ALT: 10 U/L (ref 0–44)
AST: 23 U/L (ref 15–41)
Albumin: 2.5 g/dL — ABNORMAL LOW (ref 3.5–5.0)
Alkaline Phosphatase: 192 U/L — ABNORMAL HIGH (ref 38–126)
Bilirubin, Direct: 0.3 mg/dL — ABNORMAL HIGH (ref 0.0–0.2)
Indirect Bilirubin: 0.2 mg/dL — ABNORMAL LOW (ref 0.3–0.9)
Total Bilirubin: 0.5 mg/dL (ref 0.0–1.2)
Total Protein: 5.4 g/dL — ABNORMAL LOW (ref 6.5–8.1)

## 2024-06-06 MED ORDER — WARFARIN SODIUM 2 MG PO TABS
2.0000 mg | ORAL_TABLET | Freq: Once | ORAL | Status: DC
Start: 2024-06-06 — End: 2024-06-06

## 2024-06-06 MED ORDER — VITAMIN K1 10 MG/ML IJ SOLN
5.0000 mg | Freq: Once | INTRAVENOUS | Status: AC
  Administered 2024-06-06: 5 mg via INTRAVENOUS
  Filled 2024-06-06: qty 0.5

## 2024-06-06 NOTE — Plan of Care (Signed)
  Problem: Education: Goal: Knowledge of General Education information will improve Description: Including pain rating scale, medication(s)/side effects and non-pharmacologic comfort measures Outcome: Progressing   Problem: Health Behavior/Discharge Planning: Goal: Ability to manage health-related needs will improve Outcome: Not Progressing   Problem: Nutrition: Goal: Adequate nutrition will be maintained Outcome: Progressing   Problem: Coping: Goal: Level of anxiety will decrease Outcome: Progressing   Problem: Elimination: Goal: Will not experience complications related to bowel motility Outcome: Progressing Goal: Will not experience complications related to urinary retention Outcome: Progressing   Problem: Pain Managment: Goal: General experience of comfort will improve and/or be controlled Outcome: Progressing   Problem: Safety: Goal: Ability to remain free from injury will improve Outcome: Progressing   Problem: Skin Integrity: Goal: Risk for impaired skin integrity will decrease Outcome: Progressing

## 2024-06-06 NOTE — Assessment & Plan Note (Signed)
 Present on admission.  Right heel Left heel  06-05-2024

## 2024-06-06 NOTE — Care Management Important Message (Signed)
 Important Message  Patient Details  Name: [name] MRN: [id number] Date of Birth: 1926-08-24   Important Message Given:  Yes - Medicare IM     Keanan Melander L Simonne Boulos 06/06/2024, 4:18 PM

## 2024-06-06 NOTE — Progress Notes (Signed)
 PROGRESS NOTE    [name]  FMW:[id number] DOB: 02/02/1926 DOA: 06/05/2024 PCP: Hayley Rollene BRAVO, MD  Subjective: Patient seen and examined.  She complains of being cold.  Her granddaughter [name] is at the bedside.  Patient has been bedridden since she was discharged to the hospital.  She has foot wounds.  She has a history of UTIs.  Her urine culture from the office dated 05/21/2024 growing out E. coli that is ESBL.  Patient placed on IV meropenem.  No nausea or vomiting now.   Hospital Course: CC: N/V, dark urine HPI: [name] is a 89 y.o. female with medical history significant for hypertension, atrial fibrillation on warfarin, symptomatic bradycardia with pacemaker, CKD stage IV, memory loss, anxiety, hypothyroidism, recurrent UTIs, and recent multidrug-resistant organisms who presents with nausea, vomiting, thick, dark, and malodorous urine.    Patient is accompanied by her daughter who assists with the history.  The patient has been on nitrofurantoin  for urinary tract infection but has failed to improve.  She continues to have loss of appetite and then developed nausea with recurrent bouts of vomiting throughout the day yesterday.  Her urine has become more dark, thick, and malodorous despite the antibiotic.  Patient denies any abdominal pain or flank pain.  She felt warm to the touch earlier per report of her daughter, but later felt very cold.   ED Course: Upon arrival to the ED, patient is found to be afebrile and saturating mid 90s on room air with mild tachycardia and SBP in the 90s and greater.  Labs are most notable for BUN 43, creatinine 2.86, alkaline phosphatase 249, albumin  2.5, normal WBC, and turbid brown urine with bacteriuria, pyuria, and nitrites.   Urine was sent for culture and the patient was treated with Zofran  in the ED.  Significant Events: Admitted 06/05/2024 for AKI on CKD stage 4, acute cystitis with hematuria   Admission Labs: WBC 8.4, HgB 11.1,  plt 377 UA brown, turbid, HgB Large, positive nitrite, large LE, WBC >50, RBC 21-50, bacteria many Lipase 39 Na 135, K 4.1, CO2 of 22, BUN 43, scr 2.85, glu 85 T. Prot 5.9, alb 2.5, AST 39, ALT 15, alk phos 249 INR 5.4  Admission Imaging Studies:   Significant Labs:   Significant Imaging Studies:   Antibiotic Therapy: Anti-infectives (From admission, onward)    Start     Dose/Rate Route Frequency Ordered Stop   06/05/24 0600  meropenem (MERREM) 500 mg in sodium chloride  0.9 % 100 mL IVPB        500 mg 200 mL/hr over 30 Minutes Intravenous Every 12 hours 06/05/24 0500         Procedures:   Consultants:     Assessment and Plan: * Acute renal failure superimposed on stage 4 chronic kidney disease (HCC) - baseline Scr 2.5 06/05/24 likely due to N/V, poor po intake and UTI. Continue IVF.  06/06/24 Scr down to 2.54. was admitted with Scr of 2.85.  baseline Scr 2.5. stop IVF. Family to encourage her to drink and eat.    UTI due to extended-spectrum beta lactamase (ESBL) producing Escherichia coli 06/05/24 continue with IV meropenem based on pt urine cx MIC.  06/06/24 continue with IV meropenem Day #2. Urine cx growing E. Coli and Klebsiella.    GI bleeding 06/05/24  due to elevated INR. Give FFP. Hold coumadin .  06/06/24 s/p 1 unit PRBC transfusion yesterday due to blood loss and hypotension. BP better today.  Supratherapeutic INR 06/05/24  INR 5.4. having GI bleeding. Give 2 units FFP.  06/06/24 INR still 2.9 today after 2 units FFP. Will give 5 mg IV vitamin K. Discussed with family that I am going to stop coumadin  altogether.  Vomiting-resolved as of 06/06/2024 06/05/24 due to AKI, UTI.  06/06/24 resolved.    DNR (do not resuscitate) 06/05/24 made DNR on admission   CKD (chronic kidney disease) stage 4, GFR 15-29 ml/min (HCC) - baseline serum creatinine 2-2.4 06/05/24 baseline serum creatinine 2-2.4. Has acute kidney injury on top of CKD stage IV due  to UTI and dehydration.  06/06/24 stable    Chronic anticoagulation 06/05/24 is on warfarin for history of A-fib.  Holding due to elevated INR.  06/06/24 will DC coumadin  altogether.  Discussed Pro/Con of continue systemic anticoagulation with Eliquis vs ASA only. They will think about it. I have advised pt stop systemic anticoagulation given her advanced age of 82 and just remain on ASA 81 mg daily. Family to discuss amongst themselves and let me know in the AM.    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  06/06/24 stable.   Paroxysmal atrial fibrillation (HCC) 06/05/24 continue with Cardizem  and Lopressor .  Hold Coumadin  due to elevated INR.  06/06/24 discussed with pt, dtr and both grand-dtrs. Will stop coumadin . Discussed Pro/Con of continue systemic anticoagulation with Eliquis vs ASA only. They will think about it. I have advised pt stop systemic anticoagulation given her advanced age of 55 and just remain on ASA 81 mg daily. Family to discuss amongst themselves and let me know in the AM. Continue cardizem  CD and lopressor .   Essential hypertension 06/05/24 Continue with Cardizem  CD and Lopressor .  06/06/24 was hypotensive yesterday due to GI bleeding. BP better. Hold orders on HTN meds.   Acquired hypothyroidism 06/05/24 continue Synthroid  112 mcg daily.  06/06/24 stable   Pressure injury of heel Present on admission.  Right heel Left heel  06-05-2024      DVT prophylaxis: Place and maintain sequential compression device Start: 06/06/24 1319    Code Status: Limited: Do not attempt resuscitation (DNR) -DNR-LIMITED -Do Not Intubate/DNI  Family Communication: discussed with pt's dtr, both grand-dtrs at bedside Disposition Plan: unknown Reason for continuing need for hospitalization: remains on IV meropenem.  Objective: Vitals:   06/05/24 1800 06/05/24 1802 06/05/24 2311 06/06/24 0543  BP:  (!) 107/54 (!) 99/51 (!) 101/51  Pulse:  77 73 73  Resp:  16 17 16   Temp:  98.1  F (36.7 C) 97.7 F (36.5 C) (!) 97 F (36.1 C)  TempSrc:  Oral Oral   SpO2:  97% 99% 96%  Weight: 70.2 kg     Height: 5' 6 (1.676 m)       Intake/Output Summary (Last 24 hours) at 06/06/2024 1318 Last data filed at 06/06/2024 0600 Gross per 24 hour  Intake 1465.85 ml  Output --  Net 1465.85 ml   Filed Weights   06/05/24 0128 06/05/24 1800  Weight: 73.2 kg 70.2 kg   Examination:  Physical Exam Vitals and nursing note reviewed.  Constitutional:      General: She is not in acute distress.    Appearance: She is not toxic-appearing.     Comments: awake  HENT:     Head: Normocephalic and atraumatic.  Eyes:     General: No scleral icterus. Cardiovascular:     Rate and Rhythm: Normal rate.  Pulmonary:     Effort: Pulmonary effort is normal.     Breath sounds: Normal breath sounds.  Abdominal:     General: Bowel sounds are normal.     Palpations: Abdomen is soft.  Skin:    General: Skin is warm and dry.     Capillary Refill: Capillary refill takes less than 2 seconds.  Neurological:     Comments: Very hard of hearing. Slow to respond to questions.    Data Reviewed: I have personally reviewed following labs and imaging studies  CBC: Recent Labs  Lab 06/05/24 0210 06/05/24 1121 06/05/24 1511 06/06/24 0440  WBC 8.4  --   --  6.9  NEUTROABS 4.3  --   --   --   HGB 11.1* 11.8* 8.7* 10.0*  HCT 35.8* 39.1 28.0* 31.6*  MCV 96.8  --   --  96.9  PLT 377  --   --  300   Basic Metabolic Panel: Recent Labs  Lab 06/05/24 0250 06/06/24 0440  NA 135 135  K 4.1 3.8  CL 103 104  CO2 22 21*  GLUCOSE 85 74  BUN 43* 38*  CREATININE 2.85* 2.57*  CALCIUM 9.0 8.4*   GFR: Estimated Creatinine Clearance: 12 mL/min (A) (by C-G formula based on SCr of 2.57 mg/dL (H)). Liver Function Tests: Recent Labs  Lab 06/05/24 0250 06/06/24 0440  AST 39 23  ALT 15 10  ALKPHOS 249* 192*  BILITOT 0.5 0.5  PROT 5.9* 5.4*  ALBUMIN  2.5* 2.5*   Recent Labs  Lab 06/05/24 0250   LIPASE 39   Coagulation Profile: Recent Labs  Lab 06/05/24 0250 06/06/24 0440  INR 5.4* 2.9*   Recent Results (from the past 240 hours)  Urine Culture     Status: Abnormal (Preliminary result)   Collection Time: 06/05/24  1:48 AM   Specimen: Urine, Random  Result Value Ref Range Status   Specimen Description   Final    URINE, RANDOM Performed at [facility], [address], [place], [place] [address]    Special Requests   Final    NONE Reflexed from [id number] Performed at [facility], [address] [place], [place] [address]    Culture (A)  Final    >=100,000 COLONIES/mL ESCHERICHIA COLI >=100,000 COLONIES/mL KLEBSIELLA PNEUMONIAE CULTURE REINCUBATED FOR BETTER GROWTH ISOLATING Performed at [facility], [address], [place], [place] [address]    Report Status PENDING  Incomplete    Scheduled Meds:  busPIRone   7.5 mg Oral BID   diltiazem   240 mg Oral Daily   donepezil   10 mg Oral QHS   leptospermum manuka honey  1 Application Topical Daily   levothyroxine   112 mcg Oral QAC breakfast   metoprolol  tartrate  25 mg Oral BID   pantoprazole  (PROTONIX ) IV  40 mg Intravenous Q12H   ursodiol   300 mg Oral TID   Continuous Infusions:  meropenem (MERREM) IV 500 mg (06/05/24 2134)   phytonadione (VITAMIN K) 5 mg in dextrose  5 % 50 mL IVPB       LOS: 1 day   Time spent: 65 minutes  Camellia Door, DO  [facility]  06/06/2024, 1:18 PM

## 2024-06-06 NOTE — Progress Notes (Addendum)
 PHARMACY - ANTICOAGULATION CONSULT NOTE  Pharmacy Consult for warfarin=> apixaban Indication: atrial fibrillation  No Known Allergies  Patient Measurements: Height: 5' 6 (167.6 cm) Weight: 70.2 kg (154 lb 12.2 oz) IBW/kg (Calculated) : 59.3 HEPARIN DW (KG): 70.2  Vital Signs: Temp: 97 F (36.1 C) (10/10 0543) Temp Source: Oral (10/09 2311) BP: 101/51 (10/10 0543) Pulse Rate: 73 (10/10 0543)  Labs: Recent Labs    06/05/24 0210 06/05/24 0250 06/05/24 1121 06/05/24 1511 06/06/24 0440  HGB 11.1*  --  11.8* 8.7* 10.0*  HCT 35.8*  --  39.1 28.0* 31.6*  PLT 377  --   --   --  300  LABPROT  --  51.9*  --   --  32.1*  INR  --  5.4*  --   --  2.9*  CREATININE  --  2.85*  --   --  2.57*    Estimated Creatinine Clearance: 12 mL/min (A) (by C-G formula based on SCr of 2.57 mg/dL (H)).   Medical History: Past Medical History:  Diagnosis Date   Allergy    Annual physical exam 04/22/2015   Anxiety    Arthritis    Arthritis    Asthma    Atrial fibrillation (HCC)    Atrial fibrillation with RVR (HCC) 07/03/2019   Bleeding disorder    CHF (congestive heart failure) (HCC)    CKD (chronic kidney disease) stage 4, GFR 15-29 ml/min (HCC) 09/08/2021   COPD (chronic obstructive pulmonary disease) (HCC)    Depression    Gastroesophageal reflux disease    Hearing impairment    Heart disease    Heart murmur    High cholesterol    Hyperlipidemia    Hypertension    Hypothyroidism    Impaired glucose tolerance    Macular degeneration    Oxygen  deficiency    Pancreatitis    Paroxysmal atrial fibrillation (HCC) 2011   Sick sinus syndrome (HCC) 2011   Atrial fibrilation 10/2009; and dual chamber pacemaker in 4/11; normal EF   Sleep apnea    Nocturnal oxygen  therapy   Zoster 2016    Medications:  Medications Prior to Admission  Medication Sig Dispense Refill Last Dose/Taking   acetaminophen  (TYLENOL ) 325 MG tablet Take 2 tablets (650 mg total) by mouth every 6 (six) hours  as needed for mild pain (pain score 1-3) or fever (or Fever >/= 101).   Unknown   aluminum-magnesium hydroxide 200-200 MG/5ML suspension Take 15 mLs by mouth every 6 (six) hours as needed for indigestion.   06/04/2024 Evening   busPIRone  (BUSPAR ) 7.5 MG tablet Take 1 tablet (7.5 mg total) by mouth 2 (two) times daily. 60 tablet 2 06/04/2024 Evening   diltiazem  (CARDIZEM  CD) 240 MG 24 hr capsule Take 1 capsule (240 mg total) by mouth daily. 90 capsule 3 06/04/2024 Morning   donepezil  (ARICEPT ) 10 MG tablet Take 1 tablet (10 mg total) by mouth at bedtime. 90 tablet 3 06/04/2024 Bedtime   ferrous sulfate  325 (65 FE) MG tablet Take 1 tablet (325 mg total) by mouth every Tuesday, Thursday, Saturday, and Sunday. (Patient taking differently: Take 325 mg by mouth daily.) 45 tablet 3 06/04/2024 Morning   levothyroxine  (SYNTHROID ) 112 MCG tablet Take 1 tablet (112 mcg total) by mouth daily before breakfast. 90 tablet 3 06/04/2024 at  6:00 AM   metoprolol  tartrate (LOPRESSOR ) 25 MG tablet Take 1 tablet (25 mg total) by mouth 2 (two) times daily. Hold if SBP < 100 mmhg 60 tablet 5 06/04/2024 Morning  omeprazole  (PRILOSEC) 40 MG capsule Take 1 capsule (40 mg total) by mouth daily. 90 capsule 3 06/04/2024 Morning   ursodiol  (ACTIGALL ) 300 MG capsule Take 1 capsule (300 mg total) by mouth 3 (three) times daily. 90 capsule 2 06/04/2024 Evening   Vitamin D , Ergocalciferol , (DRISDOL ) 1.25 MG (50000 UNIT) CAPS capsule Take 50,000 Units by mouth once a week. Every Friday   05/30/2024   warfarin (COUMADIN ) 2.5 MG tablet Take 2.5 mg by mouth See admin instructions. Take 1/2 tablet every day except (Wednesday Hold)   06/03/2024   Scheduled:   busPIRone   7.5 mg Oral BID   diltiazem   240 mg Oral Daily   donepezil   10 mg Oral QHS   leptospermum manuka honey  1 Application Topical Daily   levothyroxine   112 mcg Oral QAC breakfast   metoprolol  tartrate  25 mg Oral BID   pantoprazole  (PROTONIX ) IV  40 mg Intravenous Q12H   ursodiol    300 mg Oral TID   Warfarin - Pharmacist Dosing Inpatient   Does not apply q1600   Infusions:   meropenem (MERREM) IV 500 mg (06/05/24 2134)    Assessment: 89yo female admitted for AKI on CKD and recurrent UTI (MDR E.coli on 9/24 cx, was pan-sensitive E.coli on 9/4), to continue warfarin for PAF; home warfarin dose 2.5mg  daily.  INR 5.4> 2.9, therapeutic now  Per Dr. Chen=> talked with family yesterday. going to stop coumadin . change to Eliquis when her GI bleeding has resolved.   Goal of Therapy:  Monitor platelets   Plan:  Eliquis 2.5mg  po BID, to start when ok with GI F/u start date Monitor for S/S of bleeding  Cherlyn Boers, BS Pharm D, BCPS Clinical Pharmacist 06/06/2024,8:15 AM

## 2024-06-07 DIAGNOSIS — Z66 Do not resuscitate: Secondary | ICD-10-CM

## 2024-06-07 DIAGNOSIS — Z7901 Long term (current) use of anticoagulants: Secondary | ICD-10-CM | POA: Diagnosis not present

## 2024-06-07 DIAGNOSIS — Z950 Presence of cardiac pacemaker: Secondary | ICD-10-CM | POA: Diagnosis not present

## 2024-06-07 DIAGNOSIS — E039 Hypothyroidism, unspecified: Secondary | ICD-10-CM | POA: Diagnosis not present

## 2024-06-07 DIAGNOSIS — I10 Essential (primary) hypertension: Secondary | ICD-10-CM

## 2024-06-07 DIAGNOSIS — N179 Acute kidney failure, unspecified: Secondary | ICD-10-CM | POA: Diagnosis not present

## 2024-06-07 LAB — CBC
HCT: 33.8 % — ABNORMAL LOW (ref 36.0–46.0)
Hemoglobin: 10.6 g/dL — ABNORMAL LOW (ref 12.0–15.0)
MCH: 30.2 pg (ref 26.0–34.0)
MCHC: 31.4 g/dL (ref 30.0–36.0)
MCV: 96.3 fL (ref 80.0–100.0)
Platelets: 351 K/uL (ref 150–400)
RBC: 3.51 MIL/uL — ABNORMAL LOW (ref 3.87–5.11)
RDW: 15.3 % (ref 11.5–15.5)
WBC: 7.6 K/uL (ref 4.0–10.5)
nRBC: 0 % (ref 0.0–0.2)

## 2024-06-07 LAB — BASIC METABOLIC PANEL WITH GFR
Anion gap: 9 (ref 5–15)
BUN: 39 mg/dL — ABNORMAL HIGH (ref 8–23)
CO2: 21 mmol/L — ABNORMAL LOW (ref 22–32)
Calcium: 8.6 mg/dL — ABNORMAL LOW (ref 8.9–10.3)
Chloride: 106 mmol/L (ref 98–111)
Creatinine, Ser: 2.58 mg/dL — ABNORMAL HIGH (ref 0.44–1.00)
GFR, Estimated: 16 mL/min — ABNORMAL LOW (ref >60)
Glucose, Bld: 76 mg/dL (ref 70–99)
Potassium: 4.1 mmol/L (ref 3.5–5.1)
Sodium: 136 mmol/L (ref 135–145)

## 2024-06-07 LAB — PROTIME-INR
INR: 1.1 (ref 0.8–1.2)
Prothrombin Time: 14.9 s (ref 11.4–15.2)

## 2024-06-07 NOTE — Plan of Care (Signed)
  Problem: Clinical Measurements: Goal: Will remain free from infection Outcome: Not Progressing   Problem: Clinical Measurements: Goal: Diagnostic test results will improve Outcome: Not Progressing   Problem: Activity: Goal: Risk for activity intolerance will decrease Outcome: Not Progressing   Problem: Elimination: Goal: Will not experience complications related to bowel motility Outcome: Not Progressing   Problem: Elimination: Goal: Will not experience complications related to urinary retention Outcome: Not Progressing   Problem: Skin Integrity: Goal: Risk for impaired skin integrity will decrease Outcome: Not Progressing

## 2024-06-07 NOTE — Plan of Care (Signed)
   Problem: Coping: Goal: Level of anxiety will decrease Outcome: Progressing   Problem: Elimination: Goal: Will not experience complications related to bowel motility Outcome: Progressing Goal: Will not experience complications related to urinary retention Outcome: Progressing

## 2024-06-07 NOTE — Progress Notes (Signed)
 PROGRESS NOTE   [name]  FMW:[id number] DOB: 08-27-1926 DOA: 06/05/2024 PCP: [name], MD   Chief Complaint  Patient presents with   Emesis   Level of care: Med-Surg  Brief Admission History:  89 y.o. female with medical history significant for hypertension, atrial fibrillation on warfarin, symptomatic bradycardia with pacemaker, CKD stage IV, memory loss, anxiety, hypothyroidism, recurrent UTIs, and recent multidrug-resistant organisms who presents with nausea, vomiting, thick, dark, and malodorous urine.    Patient is accompanied by her daughter who assists with the history.  The patient has been on nitrofurantoin  for urinary tract infection but has failed to improve.  She continues to have loss of appetite and then developed nausea with recurrent bouts of vomiting throughout the day yesterday.  Her urine has become more dark, thick, and malodorous despite the antibiotic.  Patient denies any abdominal pain or flank pain.  She felt warm to the touch earlier per report of her daughter, but later felt very cold.   ED Course: Upon arrival to the ED, patient is found to be afebrile and saturating mid 90s on room air with mild tachycardia and SBP in the 90s and greater.  Labs are most notable for BUN 43, creatinine 2.86, alkaline phosphatase 249, albumin  2.5, normal WBC, and turbid brown urine with bacteriuria, pyuria, and nitrites.   Urine was sent for culture and the patient was treated with Zofran  in the ED.  Significant Events: Admitted 06/05/2024 for AKI on CKD stage 4, acute cystitis with hematuria    Assessment and Plan:  Acute renal failure superimposed on stage 4 chronic kidney disease - baseline Scr 2.5 likely due to N/V, poor po intake and UTI. Continue IVF.  Pressure injury of heel Present on admission.  GI bleeding due to elevated INR. Give FFP. Hold coumadin .  s/p 1 unit PRBC transfusion yesterday due to blood loss and hypotension. BP better today.  DNR  (do not resuscitate) made DNR on admission   Supratherapeutic INR INR 5.4. having GI bleeding. Give 2 units FFP.  INR still 2.9 today after 2 units FFP. Will give 5 mg IV vitamin K. Discussed with family regarding stopping warfarin, they are considering and will let us  know   UTI due to extended-spectrum beta lactamase (ESBL) producing Escherichia coli continue with IV meropenem based on pt urine cx MIC.  continue with IV meropenem Day #3. Urine cx growing E. Coli and Klebsiella.  CKD (chronic kidney disease) stage 4, GFR 15-29 ml/min  - baseline serum creatinine 2-2.4  baseline serum creatinine 2-2.4. Has acute kidney injury on top of CKD stage IV due to UTI and dehydration.  Chronic anticoagulation is on warfarin for history of A-fib.  Holding due to elevated INR.  Dr Laurence discussed Pro/Con of continue systemic anticoagulation with Eliquis vs ASA only. They will think about it. I have advised pt stop systemic anticoagulation given her advanced age of 76 and just remain on ASA 81 mg daily. Family to discuss amongst themselves and let me know in the AM.   PPM-St.Jude chronic.  stable.  Paroxysmal atrial fibrillation continue with Cardizem  and Lopressor .  Hold Coumadin  due to elevated INR.  discussed with pt, dtr and both grand-dtrs.  Discussed Pro/Con of continue systemic anticoagulation with Eliquis vs ASA only. They will think about it. I have advised pt stop systemic anticoagulation given her advanced age of 55 and just remain on ASA 81 mg daily. Family to discuss amongst themselves and let me know in the  AM. Continue cardizem  CD and lopressor .   Essential hypertension Continue with Cardizem  CD and Lopressor .  was hypotensive yesterday due to GI bleeding. BP better. Hold orders on HTN meds.  Acquired hypothyroidism continue Synthroid  112 mcg daily. stable  Vomiting-resolved due to AKI, UTI  DVT prophylaxis: SCDs Code Status: DNR  Family Communication:  Disposition:    Consultants:   Procedures:   Antimicrobials:    Subjective: No specific complaints today  Objective: Vitals:   06/06/24 1337 06/06/24 1958 06/07/24 0347 06/07/24 1333  BP: (!) 96/57 (!) 101/56 98/84 99/74   Pulse: 78 68 75 99  Resp: 16 16 14 15   Temp: 97.9 F (36.6 C) 98.2 F (36.8 C) (!) 97.5 F (36.4 C) (!) 97.5 F (36.4 C)  TempSrc: Oral Oral Oral Axillary  SpO2: 95% 93% 98% 94%  Weight:      Height:        Intake/Output Summary (Last 24 hours) at 06/07/2024 1641 Last data filed at 06/07/2024 1000 Gross per 24 hour  Intake 780 ml  Output 1 ml  Net 779 ml   Filed Weights   06/05/24 0128 06/05/24 1800  Weight: 73.2 kg 70.2 kg   Examination:  General exam: Appears calm and comfortable  Respiratory system: Clear to auscultation. Respiratory effort normal. Cardiovascular system: normal S1 & S2 heard. No JVD, murmurs, rubs, gallops or clicks. No pedal edema. Gastrointestinal system: Abdomen is nondistended, soft and nontender. No organomegaly or masses felt. Normal bowel sounds heard. Central nervous system: Alert and oriented. No focal neurological deficits. Extremities: Symmetric 5 x 5 power. Skin: No rashes, lesions or ulcers. Psychiatry: Judgement and insight appear normal. Mood & affect appropriate.   Data Reviewed: I have personally reviewed following labs and imaging studies  CBC: Recent Labs  Lab 06/05/24 0210 06/05/24 1121 06/05/24 1511 06/06/24 0440 06/07/24 0415  WBC 8.4  --   --  6.9 7.6  NEUTROABS 4.3  --   --   --   --   HGB 11.1* 11.8* 8.7* 10.0* 10.6*  HCT 35.8* 39.1 28.0* 31.6* 33.8*  MCV 96.8  --   --  96.9 96.3  PLT 377  --   --  300 351    Basic Metabolic Panel: Recent Labs  Lab 06/05/24 0250 06/06/24 0440 06/07/24 0415  NA 135 135 136  K 4.1 3.8 4.1  CL 103 104 106  CO2 22 21* 21*  GLUCOSE 85 74 76  BUN 43* 38* 39*  CREATININE 2.85* 2.57* 2.58*  CALCIUM 9.0 8.4* 8.6*    CBG: No results for input(s): GLUCAP in the  last 168 hours.  Recent Results (from the past 240 hours)  Urine Culture     Status: Abnormal (Preliminary result)   Collection Time: 06/05/24  1:48 AM   Specimen: Urine, Random  Result Value Ref Range Status   Specimen Description   Final    URINE, RANDOM Performed at [facility], [address] [place], [place] [address]    Special Requests   Final    NONE Reflexed from [id number] Performed at [facility], [address], [place], [place] [address]    Culture (A)  Final    >=100,000 COLONIES/mL ESCHERICHIA COLI >=100,000 COLONIES/mL KLEBSIELLA PNEUMONIAE SUSCEPTIBILITIES TO FOLLOW Performed at [facility], [address], [place], [place] [address]    Report Status PENDING  Incomplete     Radiology Studies: No results found.  Scheduled Meds:  busPIRone   7.5 mg Oral BID   diltiazem   240 mg Oral Daily   donepezil   10 mg Oral QHS   leptospermum manuka honey  1 Application Topical Daily   levothyroxine   112 mcg Oral QAC breakfast   metoprolol  tartrate  25 mg Oral BID   pantoprazole  (PROTONIX ) IV  40 mg Intravenous Q12H   ursodiol   300 mg Oral TID   Continuous Infusions:  meropenem (MERREM) IV 500 mg (06/07/24 1550)     LOS: 2 days   Time spent: 52 mins  Hayley Chagnon Vicci, MD How to contact the [facility] Attending or Consulting provider 7A - 7P or covering provider during after hours 7P -7A, for this patient?  Check the care team in [facility] and look for a) attending/consulting TRH provider listed and b) the TRH team listed Log into [email or web address] to find provider on call.  Locate the TRH provider you are looking for under [facility] and page to a number that you can be directly reached. If you still have difficulty reaching the provider, please page the [facility] (Director on Call) for the Hospitalists listed on amion for assistance.  06/07/2024, 4:41 PM

## 2024-06-08 DIAGNOSIS — Z66 Do not resuscitate: Secondary | ICD-10-CM | POA: Diagnosis not present

## 2024-06-08 DIAGNOSIS — K922 Gastrointestinal hemorrhage, unspecified: Secondary | ICD-10-CM | POA: Diagnosis not present

## 2024-06-08 DIAGNOSIS — N179 Acute kidney failure, unspecified: Secondary | ICD-10-CM | POA: Diagnosis not present

## 2024-06-08 DIAGNOSIS — Z7901 Long term (current) use of anticoagulants: Secondary | ICD-10-CM | POA: Diagnosis not present

## 2024-06-08 LAB — BASIC METABOLIC PANEL WITH GFR
Anion gap: 10 (ref 5–15)
BUN: 38 mg/dL — ABNORMAL HIGH (ref 8–23)
CO2: 21 mmol/L — ABNORMAL LOW (ref 22–32)
Calcium: 8.6 mg/dL — ABNORMAL LOW (ref 8.9–10.3)
Chloride: 106 mmol/L (ref 98–111)
Creatinine, Ser: 2.46 mg/dL — ABNORMAL HIGH (ref 0.44–1.00)
GFR, Estimated: 17 mL/min — ABNORMAL LOW (ref >60)
Glucose, Bld: 87 mg/dL (ref 70–99)
Potassium: 4.1 mmol/L (ref 3.5–5.1)
Sodium: 137 mmol/L (ref 135–145)

## 2024-06-08 LAB — PROTIME-INR
INR: 1 (ref 0.8–1.2)
Prothrombin Time: 13.4 s (ref 11.4–15.2)

## 2024-06-08 LAB — CBC
HCT: 33.8 % — ABNORMAL LOW (ref 36.0–46.0)
Hemoglobin: 10.8 g/dL — ABNORMAL LOW (ref 12.0–15.0)
MCH: 30.5 pg (ref 26.0–34.0)
MCHC: 32 g/dL (ref 30.0–36.0)
MCV: 95.5 fL (ref 80.0–100.0)
Platelets: 379 K/uL (ref 150–400)
RBC: 3.54 MIL/uL — ABNORMAL LOW (ref 3.87–5.11)
RDW: 15.3 % (ref 11.5–15.5)
WBC: 8.3 K/uL (ref 4.0–10.5)
nRBC: 0 % (ref 0.0–0.2)

## 2024-06-08 MED ORDER — ASPIRIN 81 MG PO TBEC: 81.0000 mg | DELAYED_RELEASE_TABLET | Freq: Every day | ORAL | Status: DC

## 2024-06-08 MED ORDER — ASPIRIN 81 MG PO TBEC
81.0000 mg | DELAYED_RELEASE_TABLET | Freq: Every day | ORAL | Status: DC
  Administered 2024-06-08: 81 mg via ORAL
  Filled 2024-06-08: qty 1

## 2024-06-08 MED ORDER — DILTIAZEM HCL ER COATED BEADS 120 MG PO CP24: 120.0000 mg | ORAL_CAPSULE | Freq: Every day | ORAL | 1 refills | Status: DC

## 2024-06-08 MED ORDER — FOSFOMYCIN TROMETHAMINE 3 G PO PACK
3.0000 g | PACK | Freq: Once | ORAL | Status: AC
  Administered 2024-06-08: 3 g via ORAL
  Filled 2024-06-08: qty 3

## 2024-06-08 MED ORDER — ORAL CARE MOUTH RINSE: 15.0000 mL | OROMUCOSAL | Status: DC | PRN

## 2024-06-08 MED ORDER — FERROUS SULFATE 325 (65 FE) MG PO TABS: 325.0000 mg | ORAL_TABLET | Freq: Every day | ORAL | Status: DC

## 2024-06-08 NOTE — TOC Transition Note (Signed)
 Transition of Care [facility]) - Discharge Note   Patient Details  Name: [name] MRN: [id number] Date of Birth: 01/01/1926  [facility]) CM/SW Contact:  Lucie Lunger, LCSWA Phone Number: 06/08/2024, 12:15 PM   Clinical Narrative:    CSW updated that pt will D/C home today. CSW updated Darice with [facility] of plan for D/C home. MD placed HH orders. TOC signing off.   Final next level of care: Home w Home Health Services Barriers to Discharge: Barriers Resolved   Patient Goals and CMS Choice Patient states their goals for this hospitalization and ongoing recovery are:: return home CMS Medicare.gov Compare Post Acute Care list provided to:: Patient Represenative (must comment) Choice offered to / list presented to : Adult Children      Discharge Placement                       Discharge Plan and Services Additional resources added to the After Visit Summary for   In-house Referral: Clinical Social Work Discharge Planning Services: CM Consult Post Acute Care Choice: Home Health                    HH Arranged: RN, OT [facility] Agency: [facility] Date [facility] Agency Contacted: 06/08/24   Representative spoke with at [facility] Agency: Darice  Social Drivers of Health (SDOH) Interventions SDOH Screenings   Food Insecurity: No Food Insecurity (06/06/2024)  Housing: Low Risk  (06/06/2024)  Transportation Needs: No Transportation Needs (06/06/2024)  Utilities: Not At Risk (06/06/2024)  Alcohol Screen: Low Risk  (09/04/2023)  Depression (PHQ2-9): Medium Risk (04/25/2024)  Financial Resource Strain: Low Risk  (09/04/2023)  Physical Activity: Inactive (09/04/2023)  Social Connections: Socially Isolated (06/06/2024)  Stress: No Stress Concern Present (09/04/2023)  Tobacco Use: Low Risk  (06/05/2024)  Health Literacy: High Risk (04/10/2024)   Received from [facility]     Readmission Risk Interventions    06/05/2024    9:26 AM 05/02/2024    1:10 PM  05/02/2024   11:14 AM  Readmission Risk Prevention Plan  Transportation Screening Complete Complete Complete  PCP or Specialist Appt within 3-5 Days  Complete Complete  HRI or Home Care Consult Complete Complete Complete  Social Work Consult for Recovery Care Planning/Counseling Complete Complete Complete  Palliative Care Screening Not Applicable Not Applicable Complete  Medication Review Oceanographer) Complete Complete Complete

## 2024-06-08 NOTE — Discharge Instructions (Signed)
IMPORTANT INFORMATION: PAY CLOSE ATTENTION   PHYSICIAN DISCHARGE INSTRUCTIONS  Follow with Primary care provider  Fayrene Helper, MD  and other consultants as instructed by your Hospitalist Physician  [place] IF SYMPTOMS COME BACK, WORSEN OR NEW PROBLEM DEVELOPS   Please note: You were cared for by a hospitalist during your hospital stay. Every effort will be made to forward records to your primary care provider.  You can request that your primary care provider send for your hospital records if they have not received them.  Once you are discharged, your primary care physician will handle any further medical issues. Please note that NO REFILLS for any discharge medications will be authorized once you are discharged, as it is imperative that you return to your primary care physician (or establish a relationship with a primary care physician if you do not have one) for your post hospital discharge needs so that they can reassess your need for medications and monitor your lab values.  Please get a complete blood count and chemistry panel checked by your Primary MD at your next visit, and again as instructed by your Primary MD.  Get Medicines reviewed and adjusted: Please take all your medications with you for your next visit with your Primary MD  Laboratory/radiological data: Please request your Primary MD to go over all hospital tests and procedure/radiological results at the follow up, please ask your primary care provider to get all Hospital records sent to his/her office.  In some cases, they will be blood work, cultures and biopsy results pending at the time of your discharge. Please request that your primary care provider follow up on these results.  If you are diabetic, please bring your blood sugar readings with you to your follow up appointment with primary care.    Please call and make your follow up appointments as soon as possible.    Also  Note the following: If you experience worsening of your admission symptoms, develop shortness of breath, life threatening emergency, suicidal or homicidal thoughts you must seek medical attention immediately by calling 911 or calling your MD immediately  if symptoms less severe.  You must read complete instructions/literature along with all the possible adverse reactions/side effects for all the Medicines you take and that have been prescribed to you. Take any new Medicines after you have completely understood and accpet all the possible adverse reactions/side effects.   Do not drive when taking Pain medications or sleeping medications (Benzodiazepines)  Do not take more than prescribed Pain, Sleep and Anxiety Medications. It is not advisable to combine anxiety,sleep and pain medications without talking with your primary care practitioner  Special Instructions: If you have smoked or chewed Tobacco  in the last 2 yrs please stop smoking, stop any regular Alcohol  and or any Recreational drug use.  Wear Seat belts while driving.  Do not drive if taking any narcotic, mind altering or controlled substances or recreational drugs or alcohol.

## 2024-06-08 NOTE — Plan of Care (Signed)

## 2024-06-08 NOTE — Discharge Summary (Signed)
 Physician Discharge Summary  [name] FMW:[id number] DOB: 1925/11/18 DOA: 06/05/2024  PCP: [name], MD  Admit date: 06/05/2024 Discharge date: 06/08/2024  Admitted From:  Home  Disposition: Home with HH and 24/7 supervision  Recommendations for Outpatient Follow-up:  Follow up with PCP in 1-2 weeks  Home Health:  RN, OT   Discharge Condition: STABLE   CODE STATUS: DNR  DIET:  soft foods, advance as tolerated to regular   Brief Hospitalization Summary: Please see all hospital notes, images, labs for full details of the hospitalization. Admission provider HPI:  89 y.o. female with medical history significant for hypertension, atrial fibrillation on warfarin, symptomatic bradycardia with pacemaker, CKD stage IV, memory loss, anxiety, hypothyroidism, recurrent UTIs, and recent multidrug-resistant organisms who presents with nausea, vomiting, thick, dark, and malodorous urine.    Patient is accompanied by her daughter who assists with the history.  The patient has been on nitrofurantoin  for urinary tract infection but has failed to improve.  She continues to have loss of appetite and then developed nausea with recurrent bouts of vomiting throughout the day yesterday.  Her urine has become more dark, thick, and malodorous despite the antibiotic.  Patient denies any abdominal pain or flank pain.  She felt warm to the touch earlier per report of her daughter, but later felt very cold.   ED Course: Upon arrival to the ED, patient is found to be afebrile and saturating mid 90s on room air with mild tachycardia and SBP in the 90s and greater.  Labs are most notable for BUN 43, creatinine 2.86, alkaline phosphatase 249, albumin  2.5, normal WBC, and turbid brown urine with bacteriuria, pyuria, and nitrites.   Urine was sent for culture and the patient was treated with Zofran  in the ED.  Significant Events: Admitted 06/05/2024 for AKI on CKD stage 4, acute cystitis with  hematuria  Hospital Course by problem listing   Acute renal failure superimposed on stage 4 chronic kidney disease - baseline Scr 2.5 likely due to N/V, poor po intake and UTI. Treated with IVF. -- creatinine back to baseline   Pressure injury of heel Present on admission. -- wound care and heel protection recommended   GI bleeding due to supratherapeutic INR. Given FFP/vitamin K. Held warfarin.   s/p 1 unit PRBC transfusion due to blood loss and hypotension. BP better now.   DNR (do not resuscitate) made DNR on admission    Supratherapeutic INR INR 5.4 with GI bleeding. Given 2 units FFP.   INR still 2.9 after 2 units FFP and given 5 mg IV vitamin K. Discussed with family regarding stopping warfarin, they decided to stop warfarin due to risk of hemorrhage and will give daily aspirin 81 mg     UTI due to extended-spectrum beta lactamase (ESBL) producing Escherichia coli treated with IV meropenem based on pt urine cx MIC.   continue with IV meropenem Day #4. Urine cx growing E. Coli and Klebsiella. -- sensitivities still pending, discussed with pharm D and will give dose of fosfomycin prior to discharge today, final dose of IV meropenem prior to discharge    CKD (chronic kidney disease) stage 4, GFR 15-29 ml/min  - baseline serum creatinine 2-2.4  baseline serum creatinine 2-2.4.    Chronic anticoagulation is on warfarin for history of A-fib.  Held warfarin due to elevated INR.   discussed Pros/Cons of continues systemic anticoagulation with Eliquis vs ASA only. They will think about it. I have advised pt stop systemic anticoagulation  given her advanced age of 21 and just remain on ASA 81 mg daily. Family to discuss amongst themselves and family reported today that they decided to stop warfarin and to use daily aspirin 81 mg daily for anticoagulation and to not pursue apixaban due to bleeding risks and understand that she could still bleed on aspirin, but want to have her to  continue daily PPI therapy for GI protection.     PPM-St.Jude chronic.  stable.   Paroxysmal atrial fibrillation continue with Cardizem  and Lopressor .  Hold Coumadin  due to elevated INR.  Due to ongoing soft BPs and inconsistent oral intake we have reduced dose of diltiazem  CD to 120 mg and given holding parameters for SBP<120 -- also continue with holding parameters for metoprolol  already in place.    discussed with pt, dtr and both grand-dtrs.  Discussed Pro/Con of continue systemic anticoagulation with Eliquis vs ASA only. They would like to continue cardizem  CD and lopressor , stop warfarin and to start aspirin 81 mg daily for anticoagulation understanding the risks versus benefits.  Also they will discuss with PCP further on outpatient follow up.      Essential hypertension Continue with Cardizem  CD and Lopressor  but will lower dose of diltiazem  to 120 mg with holding parameters   Acquired hypothyroidism continue Synthroid  112 mcg daily. stable   Vomiting-resolved due to AKI, UTI  Discharge Diagnoses:  Principal Problem:   Acute renal failure superimposed on stage 4 chronic kidney disease (HCC) - baseline Scr 2.5 Active Problems:   Acquired hypothyroidism   Essential hypertension   Paroxysmal atrial fibrillation (HCC)   PPM-St.Jude   Chronic anticoagulation   CKD (chronic kidney disease) stage 4, GFR 15-29 ml/min (HCC) - baseline serum creatinine 2-2.4   UTI due to extended-spectrum beta lactamase (ESBL) producing Escherichia coli   Supratherapeutic INR   DNR (do not resuscitate)   GI bleeding   Pressure injury of heel   Discharge Instructions:  Allergies as of 06/08/2024   No Known Allergies      Medication List     STOP taking these medications    warfarin 2.5 MG tablet Commonly known as: COUMADIN        TAKE these medications    acetaminophen  325 MG tablet Commonly known as: TYLENOL  Take 2 tablets (650 mg total) by mouth every 6 (six) hours as needed  for mild pain (pain score 1-3) or fever (or Fever >/= 101).   aluminum-magnesium hydroxide 200-200 MG/5ML suspension Take 15 mLs by mouth every 6 (six) hours as needed for indigestion.   aspirin EC 81 MG tablet Take 1 tablet (81 mg total) by mouth daily. Swallow whole. Start taking on: June 09, 2024   busPIRone  7.5 MG tablet Commonly known as: BUSPAR  Take 1 tablet (7.5 mg total) by mouth 2 (two) times daily.   diltiazem  120 MG 24 hr capsule Commonly known as: CARDIZEM  CD Take 1 capsule (120 mg total) by mouth daily. HOLD FOR SBP<120 What changed:  medication strength how much to take additional instructions   donepezil  10 MG tablet Commonly known as: ARICEPT  Take 1 tablet (10 mg total) by mouth at bedtime.   ferrous sulfate  325 (65 FE) MG tablet Take 1 tablet (325 mg total) by mouth daily with breakfast. What changed: when to take this   levothyroxine  112 MCG tablet Commonly known as: SYNTHROID  Take 1 tablet (112 mcg total) by mouth daily before breakfast.   metoprolol  tartrate 25 MG tablet Commonly known as: LOPRESSOR  Take 1 tablet (25  mg total) by mouth 2 (two) times daily. Hold if SBP < 100 mmhg   omeprazole  40 MG capsule Commonly known as: PRILOSEC Take 1 capsule (40 mg total) by mouth daily.   ursodiol  300 MG capsule Commonly known as: ACTIGALL  Take 1 capsule (300 mg total) by mouth 3 (three) times daily.   Vitamin D  (Ergocalciferol ) 1.25 MG (50000 UNIT) Caps capsule Commonly known as: DRISDOL  Take 50,000 Units by mouth once a week. Every Friday        Follow-up Information     [facility] Follow up.   Why: Home Health will call to schedule your next home visit.        [name], MD. Schedule an appointment as soon as possible for a visit in 1 week(s).   Specialty: Family Medicine Why: Hospital Follow Up Contact information: [address] [place] [address] [phone]                No  Known Allergies Allergies as of 06/08/2024   No Known Allergies      Medication List     STOP taking these medications    warfarin 2.5 MG tablet Commonly known as: COUMADIN        TAKE these medications    acetaminophen  325 MG tablet Commonly known as: TYLENOL  Take 2 tablets (650 mg total) by mouth every 6 (six) hours as needed for mild pain (pain score 1-3) or fever (or Fever >/= 101).   aluminum-magnesium hydroxide 200-200 MG/5ML suspension Take 15 mLs by mouth every 6 (six) hours as needed for indigestion.   aspirin EC 81 MG tablet Take 1 tablet (81 mg total) by mouth daily. Swallow whole. Start taking on: June 09, 2024   busPIRone  7.5 MG tablet Commonly known as: BUSPAR  Take 1 tablet (7.5 mg total) by mouth 2 (two) times daily.   diltiazem  120 MG 24 hr capsule Commonly known as: CARDIZEM  CD Take 1 capsule (120 mg total) by mouth daily. HOLD FOR SBP<120 What changed:  medication strength how much to take additional instructions   donepezil  10 MG tablet Commonly known as: ARICEPT  Take 1 tablet (10 mg total) by mouth at bedtime.   ferrous sulfate  325 (65 FE) MG tablet Take 1 tablet (325 mg total) by mouth daily with breakfast. What changed: when to take this   levothyroxine  112 MCG tablet Commonly known as: SYNTHROID  Take 1 tablet (112 mcg total) by mouth daily before breakfast.   metoprolol  tartrate 25 MG tablet Commonly known as: LOPRESSOR  Take 1 tablet (25 mg total) by mouth 2 (two) times daily. Hold if SBP < 100 mmhg   omeprazole  40 MG capsule Commonly known as: PRILOSEC Take 1 capsule (40 mg total) by mouth daily.   ursodiol  300 MG capsule Commonly known as: ACTIGALL  Take 1 capsule (300 mg total) by mouth 3 (three) times daily.   Vitamin D  (Ergocalciferol ) 1.25 MG (50000 UNIT) Caps capsule Commonly known as: DRISDOL  Take 50,000 Units by mouth once a week. Every Friday        Procedures/Studies: No results found.   Subjective: Pt  resting comfortably, no specific complaints.  Family at bedside.  They say she has been eating and drinking but not consistently but they have been able to encourage oral fluids and for her to take her medications.   Discharge Exam: Vitals:   06/08/24 1005 06/08/24 1006  BP: 127/75   Pulse:  72  Resp:    Temp:  SpO2:     Vitals:   06/07/24 1930 06/08/24 0348 06/08/24 1005 06/08/24 1006  BP: 102/67 102/75 127/75   Pulse: (!) 102 69  72  Resp: 20 20    Temp: 98.5 F (36.9 C) 98 F (36.7 C)    TempSrc: Oral Oral    SpO2: 93% 96%    Weight:      Height:        General: Pt is somnolent but arousable, not in acute distress Cardiovascular: normal S1/S2 +, no rubs, no gallops Respiratory: CTA bilaterally, no wheezing, no rhonchi Abdominal: Soft, NT, ND, bowel sounds + Extremities: no edema, no cyanosis   The results of significant diagnostics from this hospitalization (including imaging, microbiology, ancillary and laboratory) are listed below for reference.     Microbiology: Recent Results (from the past 240 hours)  Urine Culture     Status: Abnormal (Preliminary result)   Collection Time: 06/05/24  1:48 AM   Specimen: Urine, Random  Result Value Ref Range Status   Specimen Description URINE, RANDOM  Final   Special Requests NONE Reflexed from [id number]  Final   Culture (A)  Final    >=100,000 COLONIES/mL ESCHERICHIA COLI >=100,000 COLONIES/mL KLEBSIELLA PNEUMONIAE SUSCEPTIBILITIES TO FOLLOW    Report Status PENDING  Incomplete   Organism ID, Bacteria KLEBSIELLA PNEUMONIAE (A)  Final      Susceptibility   Klebsiella pneumoniae - MIC*    AMPICILLIN  >=32 RESISTANT Resistant     CEFAZOLIN  (URINE) Value in next row Sensitive      2 SENSITIVEThis is a modified FDA-approved test that has been validated and its performance characteristics determined by the reporting laboratory.  This laboratory is certified under the Clinical Laboratory Improvement Amendments CLIA as qualified  to perform high complexity clinical laboratory testing.    CEFEPIME Value in next row Sensitive      2 SENSITIVEThis is a modified FDA-approved test that has been validated and its performance characteristics determined by the reporting laboratory.  This laboratory is certified under the Clinical Laboratory Improvement Amendments CLIA as qualified to perform high complexity clinical laboratory testing.    ERTAPENEM Value in next row Sensitive      2 SENSITIVEThis is a modified FDA-approved test that has been validated and its performance characteristics determined by the reporting laboratory.  This laboratory is certified under the Clinical Laboratory Improvement Amendments CLIA as qualified to perform high complexity clinical laboratory testing.    CEFTRIAXONE  Value in next row Sensitive      2 SENSITIVEThis is a modified FDA-approved test that has been validated and its performance characteristics determined by the reporting laboratory.  This laboratory is certified under the Clinical Laboratory Improvement Amendments CLIA as qualified to perform high complexity clinical laboratory testing.    CIPROFLOXACIN  Value in next row Sensitive      2 SENSITIVEThis is a modified FDA-approved test that has been validated and its performance characteristics determined by the reporting laboratory.  This laboratory is certified under the Clinical Laboratory Improvement Amendments CLIA as qualified to perform high complexity clinical laboratory testing.    GENTAMICIN  Value in next row Sensitive      2 SENSITIVEThis is a modified FDA-approved test that has been validated and its performance characteristics determined by the reporting laboratory.  This laboratory is certified under the Clinical Laboratory Improvement Amendments CLIA as qualified to perform high complexity clinical laboratory testing.    NITROFURANTOIN  Value in next row Intermediate      2 SENSITIVEThis is a  modified FDA-approved test that has been  validated and its performance characteristics determined by the reporting laboratory.  This laboratory is certified under the Clinical Laboratory Improvement Amendments CLIA as qualified to perform high complexity clinical laboratory testing.    TRIMETH /SULFA  Value in next row Sensitive      2 SENSITIVEThis is a modified FDA-approved test that has been validated and its performance characteristics determined by the reporting laboratory.  This laboratory is certified under the Clinical Laboratory Improvement Amendments CLIA as qualified to perform high complexity clinical laboratory testing.    AMPICILLIN /SULBACTAM Value in next row Sensitive      2 SENSITIVEThis is a modified FDA-approved test that has been validated and its performance characteristics determined by the reporting laboratory.  This laboratory is certified under the Clinical Laboratory Improvement Amendments CLIA as qualified to perform high complexity clinical laboratory testing.    PIP/TAZO Value in next row Sensitive      <=4 SENSITIVEThis is a modified FDA-approved test that has been validated and its performance characteristics determined by the reporting laboratory.  This laboratory is certified under the Clinical Laboratory Improvement Amendments CLIA as qualified to perform high complexity clinical laboratory testing.    MEROPENEM Value in next row Sensitive      <=0.25 SENSITIVEPerformed at [facility], [address] [place], [place] [address]    * >=100,000 COLONIES/mL KLEBSIELLA PNEUMONIAE     Labs: BNP (last 3 results) No results for input(s): BNP in the last 8760 hours. Basic Metabolic Panel: Recent Labs  Lab 06/05/24 0250 06/06/24 0440 06/07/24 0415 06/08/24 0344  NA 135 135 136 137  K 4.1 3.8 4.1 4.1  CL 103 104 106 106  CO2 22 21* 21* 21*  GLUCOSE 85 74 76 87  BUN 43* 38* 39* 38*  CREATININE 2.85* 2.57* 2.58* 2.46*  CALCIUM 9.0 8.4* 8.6* 8.6*   Liver Function Tests: Recent Labs  Lab  06/05/24 0250 06/06/24 0440  AST 39 23  ALT 15 10  ALKPHOS 249* 192*  BILITOT 0.5 0.5  PROT 5.9* 5.4*  ALBUMIN  2.5* 2.5*   Recent Labs  Lab 06/05/24 0250  LIPASE 39   No results for input(s): AMMONIA in the last 168 hours. CBC: Recent Labs  Lab 06/05/24 0210 06/05/24 1121 06/05/24 1511 06/06/24 0440 06/07/24 0415 06/08/24 0344  WBC 8.4  --   --  6.9 7.6 8.3  NEUTROABS 4.3  --   --   --   --   --   HGB 11.1* 11.8* 8.7* 10.0* 10.6* 10.8*  HCT 35.8* 39.1 28.0* 31.6* 33.8* 33.8*  MCV 96.8  --   --  96.9 96.3 95.5  PLT 377  --   --  300 351 379   Cardiac Enzymes: No results for input(s): CKTOTAL, CKMB, CKMBINDEX, TROPONINI in the last 168 hours. BNP: Invalid input(s): POCBNP CBG: No results for input(s): GLUCAP in the last 168 hours. D-Dimer No results for input(s): DDIMER in the last 72 hours. Hgb A1c No results for input(s): HGBA1C in the last 72 hours. Lipid Profile No results for input(s): CHOL, HDL, LDLCALC, TRIG, CHOLHDL, LDLDIRECT in the last 72 hours. Thyroid  function studies No results for input(s): TSH, T4TOTAL, T3FREE, THYROIDAB in the last 72 hours.  Invalid input(s): FREET3 Anemia work up No results for input(s): VITAMINB12, FOLATE, FERRITIN, TIBC, IRON, RETICCTPCT in the last 72 hours. Urinalysis    Component Value Date/Time   COLORURINE BROWN (A) 06/05/2024 0148   APPEARANCEUR TURBID (A) 06/05/2024 0148  APPEARANCEUR Cloudy (A) 05/21/2024 1500   LABSPEC 1.020 06/05/2024 0148   PHURINE 6.5 06/05/2024 0148   GLUCOSEU 100 (A) 06/05/2024 0148   HGBUR LARGE (A) 06/05/2024 0148   HGBUR trace-lysed 01/11/2010 1458   BILIRUBINUR MODERATE (A) 06/05/2024 0148   BILIRUBINUR Negative 05/21/2024 1500   KETONESUR 15 (A) 06/05/2024 0148   PROTEINUR >300 (A) 06/05/2024 0148   UROBILINOGEN 0.2 05/25/2022 1147   UROBILINOGEN 0.2 02/09/2015 1715   NITRITE POSITIVE (A) 06/05/2024 0148   LEUKOCYTESUR LARGE  (A) 06/05/2024 0148   Sepsis Labs Recent Labs  Lab 06/05/24 0210 06/06/24 0440 06/07/24 0415 06/08/24 0344  WBC 8.4 6.9 7.6 8.3   Microbiology Recent Results (from the past 240 hours)  Urine Culture     Status: Abnormal (Preliminary result)   Collection Time: 06/05/24  1:48 AM   Specimen: Urine, Random  Result Value Ref Range Status   Specimen Description URINE, RANDOM  Final   Special Requests NONE Reflexed from [id number]  Final   Culture (A)  Final    >=100,000 COLONIES/mL ESCHERICHIA COLI >=100,000 COLONIES/mL KLEBSIELLA PNEUMONIAE SUSCEPTIBILITIES TO FOLLOW    Report Status PENDING  Incomplete   Organism ID, Bacteria KLEBSIELLA PNEUMONIAE (A)  Final      Susceptibility   Klebsiella pneumoniae - MIC*    AMPICILLIN  >=32 RESISTANT Resistant     CEFAZOLIN  (URINE) Value in next row Sensitive      2 SENSITIVEThis is a modified FDA-approved test that has been validated and its performance characteristics determined by the reporting laboratory.  This laboratory is certified under the Clinical Laboratory Improvement Amendments CLIA as qualified to perform high complexity clinical laboratory testing.    CEFEPIME Value in next row Sensitive      2 SENSITIVEThis is a modified FDA-approved test that has been validated and its performance characteristics determined by the reporting laboratory.  This laboratory is certified under the Clinical Laboratory Improvement Amendments CLIA as qualified to perform high complexity clinical laboratory testing.    ERTAPENEM Value in next row Sensitive      2 SENSITIVEThis is a modified FDA-approved test that has been validated and its performance characteristics determined by the reporting laboratory.  This laboratory is certified under the Clinical Laboratory Improvement Amendments CLIA as qualified to perform high complexity clinical laboratory testing.    CEFTRIAXONE  Value in next row Sensitive      2 SENSITIVEThis is a modified FDA-approved test that  has been validated and its performance characteristics determined by the reporting laboratory.  This laboratory is certified under the Clinical Laboratory Improvement Amendments CLIA as qualified to perform high complexity clinical laboratory testing.    CIPROFLOXACIN  Value in next row Sensitive      2 SENSITIVEThis is a modified FDA-approved test that has been validated and its performance characteristics determined by the reporting laboratory.  This laboratory is certified under the Clinical Laboratory Improvement Amendments CLIA as qualified to perform high complexity clinical laboratory testing.    GENTAMICIN  Value in next row Sensitive      2 SENSITIVEThis is a modified FDA-approved test that has been validated and its performance characteristics determined by the reporting laboratory.  This laboratory is certified under the Clinical Laboratory Improvement Amendments CLIA as qualified to perform high complexity clinical laboratory testing.    NITROFURANTOIN  Value in next row Intermediate      2 SENSITIVEThis is a modified FDA-approved test that has been validated and its performance characteristics determined by the reporting laboratory.  This laboratory is certified  under the Clinical Laboratory Improvement Amendments CLIA as qualified to perform high complexity clinical laboratory testing.    TRIMETH /SULFA  Value in next row Sensitive      2 SENSITIVEThis is a modified FDA-approved test that has been validated and its performance characteristics determined by the reporting laboratory.  This laboratory is certified under the Clinical Laboratory Improvement Amendments CLIA as qualified to perform high complexity clinical laboratory testing.    AMPICILLIN /SULBACTAM Value in next row Sensitive      2 SENSITIVEThis is a modified FDA-approved test that has been validated and its performance characteristics determined by the reporting laboratory.  This laboratory is certified under the Clinical Laboratory  Improvement Amendments CLIA as qualified to perform high complexity clinical laboratory testing.    PIP/TAZO Value in next row Sensitive      <=4 SENSITIVEThis is a modified FDA-approved test that has been validated and its performance characteristics determined by the reporting laboratory.  This laboratory is certified under the Clinical Laboratory Improvement Amendments CLIA as qualified to perform high complexity clinical laboratory testing.    MEROPENEM Value in next row Sensitive      <=0.25 SENSITIVEPerformed at [facility], [address], [place], [place] [address]    * >=100,000 COLONIES/mL KLEBSIELLA PNEUMONIAE   Time coordinating discharge: 33 mins  SIGNED:  Afton Louder, MD  [facility] 06/08/2024, 10:43 AM How to contact the [facility] Attending or Consulting provider 7A - 7P or covering provider during after hours 7P -7A, for this patient?  Check the care team in [facility] and look for a) attending/consulting [facility] provider listed and b) the [facility] team listed Log into [email or web address] and use [facility]'s universal password to access. If you do not have the password, please contact the hospital operator. Locate the [facility] provider you are looking for under [facility] and page to a number that you can be directly reached. If you still have difficulty reaching the provider, please page the [facility] (Director on Call) for the Hospitalists listed on amion for assistance.

## 2024-06-09 ENCOUNTER — Telehealth: Payer: Self-pay

## 2024-06-09 ENCOUNTER — Ambulatory Visit: Payer: Self-pay | Admitting: Family Medicine

## 2024-06-09 LAB — TYPE AND SCREEN
ABO/RH(D): B NEG
Antibody Screen: NEGATIVE
Unit division: 0
Unit division: 0

## 2024-06-09 LAB — BPAM RBC
Blood Product Expiration Date: 202511082359
Blood Product Expiration Date: 202511092359
ISSUE DATE / TIME: 202510091628
Unit Type and Rh: 1700
Unit Type and Rh: 1700

## 2024-06-09 LAB — URINE CULTURE: Culture: >=100000 — AB

## 2024-06-09 LAB — PROTIME-INR
INR: 5.4 (ref 0.8–1.2)
Prothrombin Time: 51.9 s — ABNORMAL HIGH (ref 11.4–15.2)

## 2024-06-09 NOTE — Transitions of Care (Post Inpatient/ED Visit) (Signed)
 06/09/2024  Name: [name] MRN: [id number] DOB: 09/29/1925  Today's TOC FU Call Status: Today's TOC FU Call Status:: Successful TOC FU Call Completed TOC FU Call Complete Date: 06/09/24 Patient's Name and Date of Birth confirmed.  Transition Care Management Follow-up Telephone Call Date of Discharge: 06/08/24 [facility] Type of Discharge: Inpatient Admission Primary Inpatient Discharge Diagnosis:: Acute renal failure superimposed on stage 4 chronic kidney disease How have you been since you were released from the hospital?: Better Any questions or concerns?: No  Items Reviewed: Did you receive and understand the discharge instructions provided?: Yes Medications obtained,verified, and reconciled?: Yes (Medications Reviewed) Any new allergies since your discharge?: No Dietary orders reviewed?: Yes Type of Diet Ordered:: low salt heart healthy diet Do you have support at home?: Yes People in Home [RPT]: child(ren), adult Name of Support/Comfort Primary Source: Mitzie  Medications Reviewed Today: Medications Reviewed Today     Reviewed by Rumalda Alan PENNER, RN (Registered Nurse) on 06/09/24 at 1402  Med List Status: <None>   Medication Order Taking? Sig Documenting Provider Last Dose Status Informant  acetaminophen  (TYLENOL ) 325 MG tablet [id number] Yes Take 2 tablets (650 mg total) by mouth every 6 (six) hours as needed for mild pain (pain score 1-3) or fever (or Fever >/= 101). Pearlean Manus, MD  Active Child, Pharmacy Records  aluminum-magnesium hydroxide 200-200 MG/5ML suspension [id number] Yes Take 15 mLs by mouth every 6 (six) hours as needed for indigestion. [provider]  Active Child, Pharmacy Records  aspirin EC 81 MG tablet [id number] Yes Take 1 tablet (81 mg total) by mouth daily. Swallow whole. Vicci Afton CROME, MD  Active     Discontinued 06/09/13 1213 (Completed Course)   busPIRone  (BUSPAR ) 7.5 MG tablet [id number] Yes Take 1  tablet (7.5 mg total) by mouth 2 (two) times daily. Pearlean Manus, MD  Active Child, Pharmacy Records  diltiazem  (CARDIZEM  CD) 120 MG 24 hr capsule [id number] Yes Take 1 capsule (120 mg total) by mouth daily. HOLD FOR SBP<120 Johnson, Clanford L, MD  Active   donepezil  (ARICEPT ) 10 MG tablet [id number] Yes Take 1 tablet (10 mg total) by mouth at bedtime. Pearlean Manus, MD  Active Child, Pharmacy Records  ferrous sulfate  325 [phone] FE) MG tablet [id number] Yes Take 1 tablet (325 mg total) by mouth daily with breakfast. Vicci Afton CROME, MD  Active   levothyroxine  (SYNTHROID ) 112 MCG tablet [id number] Yes Take 1 tablet (112 mcg total) by mouth daily before breakfast. Pearlean Manus, MD  Active Child, Pharmacy Records  metoprolol  tartrate (LOPRESSOR ) 25 MG tablet [id number] Yes Take 1 tablet (25 mg total) by mouth 2 (two) times daily. Hold if SBP < 100 mmhg Emokpae, Courage, MD  Active Child, Pharmacy Records  omeprazole  (PRILOSEC) 40 MG capsule [id number] Yes Take 1 capsule (40 mg total) by mouth daily. Pearlean Manus, MD  Active Child, Pharmacy Records  ursodiol  (ACTIGALL ) 300 MG capsule [id number] Yes Take 1 capsule (300 mg total) by mouth 3 (three) times daily. Willette Adriana LABOR, MD  Active Child, Pharmacy Records  Vitamin D , Ergocalciferol , (DRISDOL ) 1.25 MG (50000 UNIT) CAPS capsule [id number] Yes Take 50,000 Units by mouth once a week. Every Friday [provider]  Active Child, Pharmacy Records            Home Care and Equipment/Supplies: Were Home Health Services Ordered?: Yes Name of Home Health Agency:: [facility] Has Agency set up a time to come to your home?: No EMR reviewed for Home  Health Orders:  (daughter has not heard from them yet.) Any new equipment or medical supplies ordered?: No  Functional Questionnaire: Do you need assistance with bathing/showering or dressing?: Yes (family) Do you need assistance with meal preparation?: Yes (family) Do you need assistance  with eating?: Yes (blind and family feeds her) Do you have difficulty maintaining continence: Yes (pull ups) Do you need assistance with getting out of bed/getting out of a chair/moving?: Yes (does not get out of bed.  Heel sores) Do you have difficulty managing or taking your medications?: Yes (daughter)  Follow up appointments reviewed: PCP Follow-up appointment confirmed?: No (family will call) MD Provider Line Number:[phone] Given: No Specialist Hospital Follow-up appointment confirmed?: No Reason Specialist Follow-Up Not Confirmed: Patient has Specialist Provider Number and will Call for Appointment Do you need transportation to your follow-up appointment?: No (uses [employer]- family goes with her. Portable ramp at home) Do you understand care options if your condition(s) worsen?: Yes-patient verbalized understanding  SDOH Interventions Today    Flowsheet Row Most Recent Value  SDOH Interventions   Food Insecurity Interventions Intervention Not Indicated  Housing Interventions Intervention Not Indicated  Transportation Interventions Other (Comment)  [uses [facility]]  Utilities Interventions Intervention Not Indicated   Today's Vitals   06/09/24 1411  BP: 124/70  Pulse: 97  SpO2: 97%  PainSc: 0-No pain     Goals Addressed             This Visit's Progress    VBCI Transitions of Care (TOC) Care Plan       Problems:  Recent Hospitalization for treatment of UTI Bed bound Pressure ulcers to both heals Active with [facility]  Goal:  Over the next 30 days, the patient will not experience hospital readmission  Interventions:  Transitions of Care: Doctor Visits  - discussed the importance of doctor visits Communication with Rosina Forte  re: enrolled in [facility] Program Reviewed Signs and symptoms of infection Encouraged daughter to keep patient hydrated. Reviewed medications and daughters concern about hold metoprolol  and change of dose with cardizem . Reviewed the  discontinuation of coumadin  Reviewed and offered 30 day TOC program and daughter has accepted. Placed call to home health [facility] and confirmed resumption orders are present and patient will be seen tomorrow by RN. Reviewed vital signs This note sent to PCP Encouraged daughter to call and make a PCP follow up. Daughter has to arrange to have 3 family members present to get patient in the lift and into the wheelchair to get to MD office. Daughter will work on this.  Reviewed signs and symptoms of a fib and GI Bleed.  Encouraged daughter to make a follow up cardiology appointment.   Patient Self Care Activities:  Attend all scheduled provider appointments Call pharmacy for medication refills 3-7 days in advance of running out of medications Call provider office for new concerns or questions  Notify RN Care Manager of TOC call rescheduling needs Participate in Transition of Care Program/Attend TOC scheduled calls Take medications as prescribed   Call PCP office and cardiology office and schedule home visit Continue to monitor BP and heart rate at home  Plan:  Telephone follow up appointment with care management team member scheduled for:  06/17/2024 at 1pm The patient has been provided with contact information for the care management team and has been advised to call with any health related questions or concerns.         Alan Ee, RN, BSN, [employer]- Transition of Care Team.  [facility] [phone]

## 2024-06-09 NOTE — Progress Notes (Signed)
 Pt was treated in hospital with IV antibiotics (CTX) and fosfomycin and clinically improved.  Hayley FREDRIK Louder, MD

## 2024-06-10 ENCOUNTER — Other Ambulatory Visit: Payer: Self-pay | Admitting: Internal Medicine

## 2024-06-10 ENCOUNTER — Telehealth: Payer: Self-pay | Admitting: Pharmacist

## 2024-06-10 ENCOUNTER — Ambulatory Visit: Payer: Self-pay | Admitting: *Deleted

## 2024-06-10 DIAGNOSIS — I480 Paroxysmal atrial fibrillation: Secondary | ICD-10-CM | POA: Diagnosis not present

## 2024-06-10 DIAGNOSIS — R531 Weakness: Secondary | ICD-10-CM

## 2024-06-10 DIAGNOSIS — J449 Chronic obstructive pulmonary disease, unspecified: Secondary | ICD-10-CM | POA: Diagnosis not present

## 2024-06-10 DIAGNOSIS — E871 Hypo-osmolality and hyponatremia: Secondary | ICD-10-CM | POA: Diagnosis not present

## 2024-06-10 DIAGNOSIS — F32A Depression, unspecified: Secondary | ICD-10-CM

## 2024-06-10 DIAGNOSIS — E785 Hyperlipidemia, unspecified: Secondary | ICD-10-CM | POA: Diagnosis not present

## 2024-06-10 DIAGNOSIS — I482 Chronic atrial fibrillation, unspecified: Secondary | ICD-10-CM

## 2024-06-10 DIAGNOSIS — D631 Anemia in chronic kidney disease: Secondary | ICD-10-CM | POA: Diagnosis not present

## 2024-06-10 DIAGNOSIS — N184 Chronic kidney disease, stage 4 (severe): Secondary | ICD-10-CM

## 2024-06-10 DIAGNOSIS — I129 Hypertensive chronic kidney disease with stage 1 through stage 4 chronic kidney disease, or unspecified chronic kidney disease: Secondary | ICD-10-CM | POA: Diagnosis not present

## 2024-06-10 DIAGNOSIS — N390 Urinary tract infection, site not specified: Secondary | ICD-10-CM | POA: Diagnosis not present

## 2024-06-10 DIAGNOSIS — G4730 Sleep apnea, unspecified: Secondary | ICD-10-CM | POA: Diagnosis not present

## 2024-06-10 DIAGNOSIS — M1990 Unspecified osteoarthritis, unspecified site: Secondary | ICD-10-CM | POA: Diagnosis not present

## 2024-06-10 DIAGNOSIS — H3530 Unspecified macular degeneration: Secondary | ICD-10-CM | POA: Diagnosis not present

## 2024-06-10 DIAGNOSIS — M6281 Muscle weakness (generalized): Secondary | ICD-10-CM | POA: Diagnosis not present

## 2024-06-10 DIAGNOSIS — N179 Acute kidney failure, unspecified: Secondary | ICD-10-CM | POA: Diagnosis not present

## 2024-06-10 DIAGNOSIS — B952 Enterococcus as the cause of diseases classified elsewhere: Secondary | ICD-10-CM | POA: Diagnosis not present

## 2024-06-10 DIAGNOSIS — L89616 Pressure-induced deep tissue damage of right heel: Secondary | ICD-10-CM | POA: Diagnosis not present

## 2024-06-10 DIAGNOSIS — I5032 Chronic diastolic (congestive) heart failure: Secondary | ICD-10-CM

## 2024-06-10 DIAGNOSIS — E039 Hypothyroidism, unspecified: Secondary | ICD-10-CM | POA: Diagnosis not present

## 2024-06-10 DIAGNOSIS — L89626 Pressure-induced deep tissue damage of left heel: Secondary | ICD-10-CM | POA: Diagnosis not present

## 2024-06-10 DIAGNOSIS — K219 Gastro-esophageal reflux disease without esophagitis: Secondary | ICD-10-CM | POA: Diagnosis not present

## 2024-06-10 NOTE — Telephone Encounter (Signed)
 Received call from Reena Roys RN with [facility]. Patient had GI bleed with supratherapeutic INR while hospitalized. Family has decided to d/c warfarin. Patient now in hospice care.

## 2024-06-10 NOTE — Telephone Encounter (Signed)
 Pt removed from anticoagulation clinic monitoring.

## 2024-06-13 ENCOUNTER — Telehealth: Payer: Self-pay | Admitting: Family Medicine

## 2024-06-13 ENCOUNTER — Inpatient Hospital Stay (HOSPITAL_COMMUNITY)
Admission: EM | Admit: 2024-06-13 | Discharge: 2024-06-16 | DRG: 689 | Disposition: A | Discharge status: Hospice | Attending: Internal Medicine | Admitting: Internal Medicine

## 2024-06-13 ENCOUNTER — Encounter (HOSPITAL_COMMUNITY): Payer: Self-pay | Admitting: Emergency Medicine

## 2024-06-13 ENCOUNTER — Other Ambulatory Visit: Payer: Self-pay

## 2024-06-13 ENCOUNTER — Emergency Department (HOSPITAL_COMMUNITY)

## 2024-06-13 DIAGNOSIS — Z7901 Long term (current) use of anticoagulants: Secondary | ICD-10-CM

## 2024-06-13 DIAGNOSIS — Z79899 Other long term (current) drug therapy: Secondary | ICD-10-CM

## 2024-06-13 DIAGNOSIS — L97429 Non-pressure chronic ulcer of left heel and midfoot with unspecified severity: Secondary | ICD-10-CM | POA: Diagnosis not present

## 2024-06-13 DIAGNOSIS — R1030 Lower abdominal pain, unspecified: Secondary | ICD-10-CM | POA: Diagnosis not present

## 2024-06-13 DIAGNOSIS — K6289 Other specified diseases of anus and rectum: Secondary | ICD-10-CM | POA: Diagnosis not present

## 2024-06-13 DIAGNOSIS — I4891 Unspecified atrial fibrillation: Secondary | ICD-10-CM | POA: Diagnosis not present

## 2024-06-13 DIAGNOSIS — E860 Dehydration: Secondary | ICD-10-CM | POA: Diagnosis present

## 2024-06-13 DIAGNOSIS — J440 Chronic obstructive pulmonary disease with acute lower respiratory infection: Secondary | ICD-10-CM | POA: Diagnosis present

## 2024-06-13 DIAGNOSIS — Z9981 Dependence on supplemental oxygen: Secondary | ICD-10-CM

## 2024-06-13 DIAGNOSIS — R339 Retention of urine, unspecified: Secondary | ICD-10-CM | POA: Diagnosis not present

## 2024-06-13 DIAGNOSIS — N184 Chronic kidney disease, stage 4 (severe): Secondary | ICD-10-CM | POA: Diagnosis not present

## 2024-06-13 DIAGNOSIS — F419 Anxiety disorder, unspecified: Secondary | ICD-10-CM | POA: Insufficient documentation

## 2024-06-13 DIAGNOSIS — Z743 Need for continuous supervision: Secondary | ICD-10-CM | POA: Diagnosis not present

## 2024-06-13 DIAGNOSIS — E7800 Pure hypercholesterolemia, unspecified: Secondary | ICD-10-CM | POA: Diagnosis not present

## 2024-06-13 DIAGNOSIS — L8990 Pressure ulcer of unspecified site, unspecified stage: Secondary | ICD-10-CM | POA: Insufficient documentation

## 2024-06-13 DIAGNOSIS — B9620 Unspecified Escherichia coli [E. coli] as the cause of diseases classified elsewhere: Secondary | ICD-10-CM | POA: Diagnosis present

## 2024-06-13 DIAGNOSIS — F0390 Unspecified dementia without behavioral disturbance: Secondary | ICD-10-CM | POA: Insufficient documentation

## 2024-06-13 DIAGNOSIS — J189 Pneumonia, unspecified organism: Secondary | ICD-10-CM | POA: Diagnosis present

## 2024-06-13 DIAGNOSIS — R4189 Other symptoms and signs involving cognitive functions and awareness: Secondary | ICD-10-CM | POA: Diagnosis present

## 2024-06-13 DIAGNOSIS — Z23 Encounter for immunization: Secondary | ICD-10-CM | POA: Diagnosis not present

## 2024-06-13 DIAGNOSIS — F32A Depression, unspecified: Secondary | ICD-10-CM | POA: Diagnosis present

## 2024-06-13 DIAGNOSIS — Z818 Family history of other mental and behavioral disorders: Secondary | ICD-10-CM

## 2024-06-13 DIAGNOSIS — J90 Pleural effusion, not elsewhere classified: Secondary | ICD-10-CM | POA: Diagnosis not present

## 2024-06-13 DIAGNOSIS — K5900 Constipation, unspecified: Secondary | ICD-10-CM | POA: Diagnosis not present

## 2024-06-13 DIAGNOSIS — R627 Adult failure to thrive: Secondary | ICD-10-CM | POA: Diagnosis not present

## 2024-06-13 DIAGNOSIS — F0394 Unspecified dementia, unspecified severity, with anxiety: Secondary | ICD-10-CM | POA: Diagnosis present

## 2024-06-13 DIAGNOSIS — J9811 Atelectasis: Secondary | ICD-10-CM | POA: Diagnosis not present

## 2024-06-13 DIAGNOSIS — J9621 Acute and chronic respiratory failure with hypoxia: Secondary | ICD-10-CM | POA: Diagnosis present

## 2024-06-13 DIAGNOSIS — Z950 Presence of cardiac pacemaker: Secondary | ICD-10-CM

## 2024-06-13 DIAGNOSIS — A499 Bacterial infection, unspecified: Principal | ICD-10-CM

## 2024-06-13 DIAGNOSIS — E039 Hypothyroidism, unspecified: Secondary | ICD-10-CM | POA: Diagnosis present

## 2024-06-13 DIAGNOSIS — N390 Urinary tract infection, site not specified: Secondary | ICD-10-CM | POA: Diagnosis not present

## 2024-06-13 DIAGNOSIS — J181 Lobar pneumonia, unspecified organism: Secondary | ICD-10-CM

## 2024-06-13 DIAGNOSIS — I129 Hypertensive chronic kidney disease with stage 1 through stage 4 chronic kidney disease, or unspecified chronic kidney disease: Secondary | ICD-10-CM | POA: Diagnosis not present

## 2024-06-13 DIAGNOSIS — K838 Other specified diseases of biliary tract: Secondary | ICD-10-CM | POA: Diagnosis present

## 2024-06-13 DIAGNOSIS — Z7982 Long term (current) use of aspirin: Secondary | ICD-10-CM | POA: Diagnosis not present

## 2024-06-13 DIAGNOSIS — I480 Paroxysmal atrial fibrillation: Secondary | ICD-10-CM | POA: Diagnosis present

## 2024-06-13 DIAGNOSIS — K219 Gastro-esophageal reflux disease without esophagitis: Secondary | ICD-10-CM | POA: Diagnosis present

## 2024-06-13 DIAGNOSIS — L98419 Non-pressure chronic ulcer of buttock with unspecified severity: Secondary | ICD-10-CM | POA: Diagnosis present

## 2024-06-13 DIAGNOSIS — K5730 Diverticulosis of large intestine without perforation or abscess without bleeding: Secondary | ICD-10-CM | POA: Diagnosis not present

## 2024-06-13 DIAGNOSIS — Z66 Do not resuscitate: Secondary | ICD-10-CM | POA: Diagnosis present

## 2024-06-13 DIAGNOSIS — L97419 Non-pressure chronic ulcer of right heel and midfoot with unspecified severity: Secondary | ICD-10-CM | POA: Diagnosis present

## 2024-06-13 DIAGNOSIS — Z79890 Hormone replacement therapy (postmenopausal): Secondary | ICD-10-CM

## 2024-06-13 DIAGNOSIS — R9431 Abnormal electrocardiogram [ECG] [EKG]: Secondary | ICD-10-CM | POA: Insufficient documentation

## 2024-06-13 DIAGNOSIS — Z825 Family history of asthma and other chronic lower respiratory diseases: Secondary | ICD-10-CM

## 2024-06-13 LAB — URINALYSIS, W/ REFLEX TO CULTURE (INFECTION SUSPECTED)
Bilirubin Urine: NEGATIVE
Glucose, UA: NEGATIVE mg/dL
Ketones, ur: NEGATIVE mg/dL
Nitrite: NEGATIVE
Protein, ur: 30 mg/dL — AB
RBC / HPF: >50 RBC/hpf (ref 0–5)
Specific Gravity, Urine: 1.015 (ref 1.005–1.030)
WBC, UA: >50 WBC/hpf (ref 0–5)
pH: 5 (ref 5.0–8.0)

## 2024-06-13 LAB — CBC WITH DIFFERENTIAL/PLATELET
Abs Immature Granulocytes: 0.08 K/uL — ABNORMAL HIGH (ref 0.00–0.07)
Basophils Absolute: 0.1 K/uL (ref 0.0–0.1)
Basophils Relative: 1 %
Eosinophils Absolute: 0 K/uL (ref 0.0–0.5)
Eosinophils Relative: 0 %
HCT: 37.9 % (ref 36.0–46.0)
Hemoglobin: 12.2 g/dL (ref 12.0–15.0)
Immature Granulocytes: 1 %
Lymphocytes Relative: 13 %
Lymphs Abs: 1.9 K/uL (ref 0.7–4.0)
MCH: 30.5 pg (ref 26.0–34.0)
MCHC: 32.2 g/dL (ref 30.0–36.0)
MCV: 94.8 fL (ref 80.0–100.0)
Monocytes Absolute: 0.8 K/uL (ref 0.1–1.0)
Monocytes Relative: 6 %
Neutro Abs: 11.3 K/uL — ABNORMAL HIGH (ref 1.7–7.7)
Neutrophils Relative %: 79 %
Platelets: 448 K/uL — ABNORMAL HIGH (ref 150–400)
RBC: 4 MIL/uL (ref 3.87–5.11)
RDW: 14.8 % (ref 11.5–15.5)
WBC: 14.2 K/uL — ABNORMAL HIGH (ref 4.0–10.5)
nRBC: 0 % (ref 0.0–0.2)

## 2024-06-13 LAB — BASIC METABOLIC PANEL WITH GFR
Anion gap: 13 (ref 5–15)
BUN: 33 mg/dL — ABNORMAL HIGH (ref 8–23)
CO2: 21 mmol/L — ABNORMAL LOW (ref 22–32)
Calcium: 8.7 mg/dL — ABNORMAL LOW (ref 8.9–10.3)
Chloride: 105 mmol/L (ref 98–111)
Creatinine, Ser: 1.93 mg/dL — ABNORMAL HIGH (ref 0.44–1.00)
GFR, Estimated: 23 mL/min — ABNORMAL LOW (ref >60)
Glucose, Bld: 104 mg/dL — ABNORMAL HIGH (ref 70–99)
Potassium: 4.1 mmol/L (ref 3.5–5.1)
Sodium: 138 mmol/L (ref 135–145)

## 2024-06-13 LAB — POC OCCULT BLOOD, ED: Fecal Occult Blood: NEGATIVE

## 2024-06-13 MED ORDER — METOPROLOL TARTRATE 5 MG/5ML IV SOLN
5.0000 mg | Freq: Once | INTRAVENOUS | Status: AC
  Administered 2024-06-13: 5 mg via INTRAVENOUS
  Filled 2024-06-13: qty 5

## 2024-06-13 MED ORDER — SODIUM CHLORIDE 0.9 % IV BOLUS
1000.0000 mL | Freq: Once | INTRAVENOUS | Status: AC
  Administered 2024-06-13: 1000 mL via INTRAVENOUS

## 2024-06-13 NOTE — ED Triage Notes (Signed)
 Pt bib EMS from home after family states pt has urinated twice in 22hrs (pt has Stg 4 kidney disease).  Family also reported to EMS that pt had been constipated and that they manually disimpacted pt at home 4 times. Pt now c/o rectal pain per EMS.

## 2024-06-13 NOTE — Telephone Encounter (Signed)
 Copied from CRM [id number]. Topic: General - Other >> Jun 13, 2024  3:00 PM Lonell PEDLAR wrote: Reason for CRM: Roby with [facility] called to request the name of the patient's attending hospice provider, please c/b [phone] ext [phone]

## 2024-06-13 NOTE — ED Provider Notes (Signed)
 [place] EMERGENCY DEPARTMENT AT [facility] Provider Note   CSN: [id number] Arrival date & time: 06/13/24  8063     Patient presents with: Urinary Retention, Constipation, and Rectal Pain   [name] is a 89 y.o. female.  She is brought in by ambulance from home.  Per EMS she has been constipated and family has been disimpacting her.  They also note that she has not had much urine output in the last few days.  Patient herself states that she had some burning rectal pain after the disimpaction but that is better.  She does not have any dysuria but also notes that she has not made much urine.  No fevers chills nausea vomiting.  {Add pertinent medical, surgical, social history, OB history to YEP:67052} The history is provided by the patient and the EMS personnel.  Abdominal Pain Pain location:  Suprapubic Associated symptoms: constipation   Associated symptoms: no dysuria, no fever and no vomiting   Risk factors: recent hospitalization        Prior to Admission medications   Medication Sig Start Date End Date Taking? Authorizing Provider  acetaminophen  (TYLENOL ) 325 MG tablet Take 2 tablets (650 mg total) by mouth every 6 (six) hours as needed for mild pain (pain score 1-3) or fever (or Fever >/= 101). 05/05/24   Emokpae, Courage, MD  aluminum-magnesium hydroxide 200-200 MG/5ML suspension Take 15 mLs by mouth every 6 (six) hours as needed for indigestion.    [provider]  aspirin EC 81 MG tablet Take 1 tablet (81 mg total) by mouth daily. Swallow whole. 06/09/24   Johnson, Clanford L, MD  busPIRone  (BUSPAR ) 7.5 MG tablet Take 1 tablet (7.5 mg total) by mouth 2 (two) times daily. 05/05/24   Pearlean Manus, MD  diltiazem  (CARDIZEM  CD) 120 MG 24 hr capsule Take 1 capsule (120 mg total) by mouth daily. HOLD FOR SBP<120 06/08/24   Johnson, Clanford L, MD  donepezil  (ARICEPT ) 10 MG tablet Take 1 tablet (10 mg total) by mouth at bedtime. 05/05/24   Pearlean Manus, MD   ferrous sulfate  325 (65 FE) MG tablet Take 1 tablet (325 mg total) by mouth daily with breakfast. 06/08/24   Vicci Afton CROME, MD  levothyroxine  (SYNTHROID ) 112 MCG tablet Take 1 tablet (112 mcg total) by mouth daily before breakfast. 05/05/24   Pearlean Manus, MD  metoprolol  tartrate (LOPRESSOR ) 25 MG tablet Take 1 tablet (25 mg total) by mouth 2 (two) times daily. Hold if SBP < 100 mmhg 05/05/24   Emokpae, Courage, MD  omeprazole  (PRILOSEC) 40 MG capsule Take 1 capsule (40 mg total) by mouth daily. 05/05/24   Pearlean Manus, MD  ursodiol  (ACTIGALL ) 300 MG capsule Take 1 capsule (300 mg total) by mouth 3 (three) times daily. 04/03/24 07/02/24  ShahmehdiAdriana LABOR, MD  Vitamin D , Ergocalciferol , (DRISDOL ) 1.25 MG (50000 UNIT) CAPS capsule Take 50,000 Units by mouth once a week. Every Friday 05/20/24   [provider]  benzonatate  (TESSALON  PERLES) 100 MG capsule Take 1 capsule (100 mg total) by mouth every 6 (six) hours as needed for cough. 02/05/13 11/04/18  [name], MD    Allergies: Patient has no known allergies.    Review of Systems  Constitutional:  Negative for fever.  Gastrointestinal:  Positive for abdominal pain and constipation. Negative for vomiting.  Genitourinary:  Negative for dysuria.    Updated Vital Signs Ht 5' 6 (1.676 m)   Wt 70 kg   BMI 24.91 kg/m  Physical Exam Vitals and nursing note reviewed.  Constitutional:      General: She is not in acute distress.    Appearance: Normal appearance. She is well-developed.  HENT:     Head: Normocephalic and atraumatic.  Eyes:     Conjunctiva/sclera: Conjunctivae normal.  Cardiovascular:     Rate and Rhythm: Normal rate and regular rhythm.     Heart sounds: No murmur heard. Pulmonary:     Effort: Pulmonary effort is normal. No respiratory distress.     Breath sounds: Normal breath sounds. No stridor. No wheezing.  Abdominal:     Palpations: Abdomen is soft.     Tenderness: There is abdominal tenderness  (suprapubic). There is no guarding or rebound.  Musculoskeletal:        General: No deformity.     Cervical back: Neck supple.  Skin:    General: Skin is warm and dry.  Neurological:     General: No focal deficit present.     Mental Status: She is alert.     GCS: GCS eye subscore is 4. GCS verbal subscore is 5. GCS motor subscore is 6.     (all labs ordered are listed, but only abnormal results are displayed) Labs Reviewed - No data to display  EKG: None  Radiology: No results found.  {Document cardiac monitor, telemetry assessment procedure when appropriate:32947} Procedures   Medications Ordered in the ED  sodium chloride  0.9 % bolus 1,000 mL (has no administration in time range)    Clinical Course as of 06/13/24 2323  Fri Jun 13, 2024  2044 Patient's family members are here now.  They said they are not particular worried about her constipation, they have been disimpacting her and that is why she is having rectal pain.  They are mostly concerned about her lack of urine output.  Her bladder scan showed 400 cc with nurses come to do an In-N-Out. [MB]  2124 Patient had a large bowel movement here.  Working on cleaning her up so they can try to get a urine sample from her. [MB]  2138 EKG not crossing into epic.  A-fib with mild RVR rate of 104. [MB]  2322 Nurse was able to cath urine and it sounds like it was fairly dark and had a lot of debris in it.  Will put her in for CT.  A-fib still is been up and down ordered another dose of metoprolol . [MB]    Clinical Course User Index [MB] Towana Ozell BROCKS, MD   {Click here for ABCD2, HEART and other calculators REFRESH Note before signing:1}                              Medical Decision Making Amount and/or Complexity of Data Reviewed Labs: ordered. Radiology: ordered.  Risk Prescription drug management.   This patient complains of ***; this involves an extensive number of treatment Options and is a complaint that carries  with it a high risk of complications and morbidity. The differential includes ***  I ordered, reviewed and interpreted labs, which included *** I ordered medication *** and reviewed PMP when indicated. I ordered imaging studies which included *** and I independently    visualized and interpreted imaging which showed *** Additional history obtained from *** Previous records obtained and reviewed *** I consulted *** and discussed lab and imaging findings and discussed disposition.  Cardiac monitoring reviewed, *** Social determinants considered, *** Critical Interventions: ***  After the interventions stated above, I reevaluated the patient and found *** Admission and further testing considered, ***   {Document critical care time when appropriate  Document review of labs and clinical decision tools ie CHADS2VASC2, etc  Document your independent review of radiology images and any outside records  Document your discussion with family members, caretakers and with consultants  Document social determinants of health affecting pt's care  Document your decision making why or why not admission, treatments were needed:32947:::1}   Final diagnoses:  None    ED Discharge Orders     None

## 2024-06-13 NOTE — ED Notes (Signed)
 Provider notified of pt tachycardia.

## 2024-06-14 ENCOUNTER — Telehealth: Payer: Self-pay | Admitting: Cardiothoracic Surgery

## 2024-06-14 ENCOUNTER — Inpatient Hospital Stay (HOSPITAL_COMMUNITY)

## 2024-06-14 DIAGNOSIS — R627 Adult failure to thrive: Secondary | ICD-10-CM | POA: Insufficient documentation

## 2024-06-14 DIAGNOSIS — E039 Hypothyroidism, unspecified: Secondary | ICD-10-CM | POA: Diagnosis present

## 2024-06-14 DIAGNOSIS — J9811 Atelectasis: Secondary | ICD-10-CM

## 2024-06-14 DIAGNOSIS — N3001 Acute cystitis with hematuria: Secondary | ICD-10-CM

## 2024-06-14 DIAGNOSIS — I129 Hypertensive chronic kidney disease with stage 1 through stage 4 chronic kidney disease, or unspecified chronic kidney disease: Secondary | ICD-10-CM | POA: Diagnosis present

## 2024-06-14 DIAGNOSIS — L8990 Pressure ulcer of unspecified site, unspecified stage: Secondary | ICD-10-CM | POA: Insufficient documentation

## 2024-06-14 DIAGNOSIS — J189 Pneumonia, unspecified organism: Secondary | ICD-10-CM | POA: Diagnosis present

## 2024-06-14 DIAGNOSIS — F419 Anxiety disorder, unspecified: Secondary | ICD-10-CM | POA: Insufficient documentation

## 2024-06-14 DIAGNOSIS — N390 Urinary tract infection, site not specified: Principal | ICD-10-CM

## 2024-06-14 DIAGNOSIS — J90 Pleural effusion, not elsewhere classified: Secondary | ICD-10-CM | POA: Diagnosis not present

## 2024-06-14 DIAGNOSIS — J440 Chronic obstructive pulmonary disease with acute lower respiratory infection: Secondary | ICD-10-CM | POA: Diagnosis not present

## 2024-06-14 DIAGNOSIS — Z7901 Long term (current) use of anticoagulants: Secondary | ICD-10-CM | POA: Diagnosis not present

## 2024-06-14 DIAGNOSIS — R319 Hematuria, unspecified: Secondary | ICD-10-CM | POA: Diagnosis not present

## 2024-06-14 DIAGNOSIS — Z7189 Other specified counseling: Secondary | ICD-10-CM

## 2024-06-14 DIAGNOSIS — J181 Lobar pneumonia, unspecified organism: Secondary | ICD-10-CM | POA: Diagnosis not present

## 2024-06-14 DIAGNOSIS — L97429 Non-pressure chronic ulcer of left heel and midfoot with unspecified severity: Secondary | ICD-10-CM | POA: Diagnosis present

## 2024-06-14 DIAGNOSIS — Z7982 Long term (current) use of aspirin: Secondary | ICD-10-CM | POA: Diagnosis not present

## 2024-06-14 DIAGNOSIS — E860 Dehydration: Secondary | ICD-10-CM | POA: Insufficient documentation

## 2024-06-14 DIAGNOSIS — J9621 Acute and chronic respiratory failure with hypoxia: Secondary | ICD-10-CM | POA: Diagnosis not present

## 2024-06-14 DIAGNOSIS — I4891 Unspecified atrial fibrillation: Secondary | ICD-10-CM | POA: Diagnosis not present

## 2024-06-14 DIAGNOSIS — B9620 Unspecified Escherichia coli [E. coli] as the cause of diseases classified elsewhere: Secondary | ICD-10-CM | POA: Diagnosis present

## 2024-06-14 DIAGNOSIS — Z23 Encounter for immunization: Secondary | ICD-10-CM | POA: Diagnosis not present

## 2024-06-14 DIAGNOSIS — R0902 Hypoxemia: Secondary | ICD-10-CM | POA: Diagnosis not present

## 2024-06-14 DIAGNOSIS — F0390 Unspecified dementia without behavioral disturbance: Secondary | ICD-10-CM

## 2024-06-14 DIAGNOSIS — R9431 Abnormal electrocardiogram [ECG] [EKG]: Secondary | ICD-10-CM

## 2024-06-14 DIAGNOSIS — Z66 Do not resuscitate: Secondary | ICD-10-CM | POA: Diagnosis not present

## 2024-06-14 DIAGNOSIS — N184 Chronic kidney disease, stage 4 (severe): Secondary | ICD-10-CM

## 2024-06-14 DIAGNOSIS — R918 Other nonspecific abnormal finding of lung field: Secondary | ICD-10-CM | POA: Diagnosis not present

## 2024-06-14 DIAGNOSIS — K6289 Other specified diseases of anus and rectum: Secondary | ICD-10-CM | POA: Diagnosis present

## 2024-06-14 DIAGNOSIS — F32A Depression, unspecified: Secondary | ICD-10-CM | POA: Diagnosis present

## 2024-06-14 DIAGNOSIS — L97419 Non-pressure chronic ulcer of right heel and midfoot with unspecified severity: Secondary | ICD-10-CM | POA: Diagnosis not present

## 2024-06-14 DIAGNOSIS — K838 Other specified diseases of biliary tract: Secondary | ICD-10-CM | POA: Diagnosis not present

## 2024-06-14 DIAGNOSIS — I2510 Atherosclerotic heart disease of native coronary artery without angina pectoris: Secondary | ICD-10-CM | POA: Diagnosis not present

## 2024-06-14 DIAGNOSIS — F0394 Unspecified dementia, unspecified severity, with anxiety: Secondary | ICD-10-CM | POA: Diagnosis present

## 2024-06-14 DIAGNOSIS — I480 Paroxysmal atrial fibrillation: Secondary | ICD-10-CM

## 2024-06-14 DIAGNOSIS — L98419 Non-pressure chronic ulcer of buttock with unspecified severity: Secondary | ICD-10-CM | POA: Diagnosis present

## 2024-06-14 DIAGNOSIS — E7800 Pure hypercholesterolemia, unspecified: Secondary | ICD-10-CM | POA: Diagnosis present

## 2024-06-14 LAB — COMPREHENSIVE METABOLIC PANEL WITH GFR
ALT: <5 U/L (ref 0–44)
AST: 13 U/L — ABNORMAL LOW (ref 15–41)
Albumin: 2.3 g/dL — ABNORMAL LOW (ref 3.5–5.0)
Alkaline Phosphatase: 116 U/L (ref 38–126)
Anion gap: 11 (ref 5–15)
BUN: 30 mg/dL — ABNORMAL HIGH (ref 8–23)
CO2: 20 mmol/L — ABNORMAL LOW (ref 22–32)
Calcium: 8.6 mg/dL — ABNORMAL LOW (ref 8.9–10.3)
Chloride: 108 mmol/L (ref 98–111)
Creatinine, Ser: 1.78 mg/dL — ABNORMAL HIGH (ref 0.44–1.00)
GFR, Estimated: 26 mL/min — ABNORMAL LOW (ref >60)
Glucose, Bld: 83 mg/dL (ref 70–99)
Potassium: 3.9 mmol/L (ref 3.5–5.1)
Sodium: 139 mmol/L (ref 135–145)
Total Bilirubin: 0.5 mg/dL (ref 0.0–1.2)
Total Protein: 5.4 g/dL — ABNORMAL LOW (ref 6.5–8.1)

## 2024-06-14 LAB — BLOOD GAS, VENOUS
Acid-base deficit: 3.4 mmol/L — ABNORMAL HIGH (ref 0.0–2.0)
Bicarbonate: 20.3 mmol/L (ref 20.0–28.0)
Drawn by: 4981
O2 Saturation: 88.9 %
Patient temperature: 36.6
pCO2, Ven: 31 mmHg — ABNORMAL LOW (ref 44–60)
pH, Ven: 7.42 (ref 7.25–7.43)
pO2, Ven: 51 mmHg — ABNORMAL HIGH (ref 32–45)

## 2024-06-14 LAB — CBC
HCT: 35.1 % — ABNORMAL LOW (ref 36.0–46.0)
Hemoglobin: 11.2 g/dL — ABNORMAL LOW (ref 12.0–15.0)
MCH: 30.3 pg (ref 26.0–34.0)
MCHC: 31.9 g/dL (ref 30.0–36.0)
MCV: 94.9 fL (ref 80.0–100.0)
Platelets: 387 K/uL (ref 150–400)
RBC: 3.7 MIL/uL — ABNORMAL LOW (ref 3.87–5.11)
RDW: 14.9 % (ref 11.5–15.5)
WBC: 12.4 K/uL — ABNORMAL HIGH (ref 4.0–10.5)
nRBC: 0 % (ref 0.0–0.2)

## 2024-06-14 LAB — FOLATE: Folate: 4.9 ng/mL — ABNORMAL LOW (ref >5.9)

## 2024-06-14 LAB — PHOSPHORUS: Phosphorus: 2.9 mg/dL (ref 2.5–4.6)

## 2024-06-14 LAB — VITAMIN B12: Vitamin B-12: 830 pg/mL (ref 180–914)

## 2024-06-14 LAB — MAGNESIUM: Magnesium: 1.8 mg/dL (ref 1.7–2.4)

## 2024-06-14 LAB — TSH: TSH: 5.86 u[IU]/mL — ABNORMAL HIGH (ref 0.350–4.500)

## 2024-06-14 MED ORDER — ACETAMINOPHEN 650 MG RE SUPP: 650.0000 mg | Freq: Four times a day (QID) | RECTAL | Status: DC | PRN

## 2024-06-14 MED ORDER — METOPROLOL TARTRATE 25 MG PO TABS
25.0000 mg | ORAL_TABLET | Freq: Two times a day (BID) | ORAL | Status: DC
  Administered 2024-06-14 – 2024-06-16 (×4): 25 mg via ORAL
  Filled 2024-06-14 (×5): qty 1

## 2024-06-14 MED ORDER — INFLUENZA VAC SPLIT HIGH-DOSE 0.5 ML IM SUSY: 0.5000 mL | PREFILLED_SYRINGE | INTRAMUSCULAR | Status: DC

## 2024-06-14 MED ORDER — LACTATED RINGERS IV SOLN: INTRAVENOUS | Status: AC

## 2024-06-14 MED ORDER — LACTATED RINGERS IV SOLN: INTRAVENOUS | Status: DC

## 2024-06-14 MED ORDER — MEDIHONEY WOUND/BURN DRESSING EX PSTE
1.0000 | PASTE | Freq: Every day | CUTANEOUS | Status: DC
  Administered 2024-06-14 – 2024-06-16 (×3): 1 via TOPICAL
  Filled 2024-06-14: qty 44

## 2024-06-14 MED ORDER — SODIUM CHLORIDE 0.9 % IV SOLN
1.0000 g | Freq: Once | INTRAVENOUS | Status: AC
  Administered 2024-06-14: 1 g via INTRAVENOUS
  Filled 2024-06-14: qty 20

## 2024-06-14 MED ORDER — ENOXAPARIN SODIUM 30 MG/0.3ML IJ SOSY
30.0000 mg | PREFILLED_SYRINGE | Freq: Every day | INTRAMUSCULAR | Status: DC
  Administered 2024-06-14 – 2024-06-16 (×2): 30 mg via SUBCUTANEOUS
  Filled 2024-06-14 (×3): qty 0.3

## 2024-06-14 MED ORDER — SODIUM CHLORIDE 0.9 % IV SOLN
1.0000 g | INTRAVENOUS | Status: DC
  Administered 2024-06-14 – 2024-06-15 (×2): 1 g via INTRAVENOUS
  Filled 2024-06-14 (×3): qty 10

## 2024-06-14 MED ORDER — ACETAMINOPHEN 325 MG PO TABS: 650.0000 mg | ORAL_TABLET | Freq: Four times a day (QID) | ORAL | Status: DC | PRN

## 2024-06-14 MED ORDER — PANTOPRAZOLE SODIUM 40 MG PO TBEC
80.0000 mg | DELAYED_RELEASE_TABLET | Freq: Every day | ORAL | Status: DC
  Administered 2024-06-14 – 2024-06-16 (×3): 80 mg via ORAL
  Filled 2024-06-14 (×3): qty 2

## 2024-06-14 MED ORDER — METRONIDAZOLE 500 MG/100ML IV SOLN
500.0000 mg | Freq: Two times a day (BID) | INTRAVENOUS | Status: DC
  Administered 2024-06-14 – 2024-06-15 (×4): 500 mg via INTRAVENOUS
  Filled 2024-06-14 (×5): qty 100

## 2024-06-14 MED ORDER — DONEPEZIL HCL 5 MG PO TABS
10.0000 mg | ORAL_TABLET | Freq: Every day | ORAL | Status: DC
  Administered 2024-06-14 – 2024-06-15 (×2): 10 mg via ORAL
  Filled 2024-06-14 (×2): qty 2

## 2024-06-14 MED ORDER — BUSPIRONE HCL 15 MG PO TABS
7.5000 mg | ORAL_TABLET | Freq: Two times a day (BID) | ORAL | Status: DC
  Administered 2024-06-14 – 2024-06-16 (×5): 7.5 mg via ORAL
  Filled 2024-06-14 (×5): qty 1

## 2024-06-14 MED ORDER — LEVOTHYROXINE SODIUM 112 MCG PO TABS
112.0000 ug | ORAL_TABLET | Freq: Every day | ORAL | Status: DC
  Administered 2024-06-14 – 2024-06-16 (×3): 112 ug via ORAL
  Filled 2024-06-14 (×3): qty 1

## 2024-06-14 MED ORDER — DILTIAZEM HCL ER COATED BEADS 120 MG PO CP24
120.0000 mg | ORAL_CAPSULE | Freq: Every day | ORAL | Status: DC
  Administered 2024-06-14 – 2024-06-15 (×2): 120 mg via ORAL
  Filled 2024-06-14 (×3): qty 1

## 2024-06-14 MED ORDER — MAGNESIUM SULFATE IN D5W 1-5 GM/100ML-% IV SOLN
1.0000 g | Freq: Once | INTRAVENOUS | Status: AC
  Administered 2024-06-14: 1 g via INTRAVENOUS
  Filled 2024-06-14: qty 100

## 2024-06-14 MED ORDER — PROCHLORPERAZINE EDISYLATE 10 MG/2ML IJ SOLN
10.0000 mg | Freq: Four times a day (QID) | INTRAMUSCULAR | Status: DC | PRN
  Administered 2024-06-15: 10 mg via INTRAVENOUS
  Filled 2024-06-14: qty 2

## 2024-06-14 NOTE — TOC Initial Note (Addendum)
 Transition of Care [facility]) - Initial/Assessment Note    Patient Details  Name: [name] MRN: [id number] Date of Birth: 1926/04/05  [facility] CM/SW Contact:    Lucie Lunger, LCSWA Phone Number: 06/14/2024, 10:05 AM  Clinical Narrative:                 Pt is high risk for readmission. CSW notes pt with recent hospital admission last week. Pt lives with her daughter. Pt has assistance with ADLs by daughter and granddaughter. Pt has HH RN/OT with [facility]. Pt has a hospital bed, EZ lift, lift chair and wheel chair to use when needed. Pt has outpatient palliative services with [facility]. Pt wears O2 at night. TOC to follow.   Addendum: CSW received update from [place] with [facility] that they had just received referral for pt and will be starting home hospice services once pt is medically stable for D/C home. TOC to follow.   Expected Discharge Plan: Home w Home Health Services Barriers to Discharge: Continued Medical Work up   Patient Goals and CMS Choice Patient states their goals for this hospitalization and ongoing recovery are:: return home CMS Medicare.gov Compare Post Acute Care list provided to:: Patient Represenative (must comment) Choice offered to / list presented to : Adult Children      Expected Discharge Plan and Services In-house Referral: Clinical Social Work Discharge Planning Services: CM Consult Post Acute Care Choice: Home Health Living arrangements for the past 2 months: Single Family Home                                      Prior Living Arrangements/Services Living arrangements for the past 2 months: Single Family Home Lives with:: Adult Children Patient language and need for interpreter reviewed:: Yes Do you feel safe going back to the place where you live?: Yes      Need for Family Participation in Patient Care: Yes (Comment) Care giver support system in place?: Yes (comment) Current home services: DME Criminal  Activity/Legal Involvement Pertinent to Current Situation/Hospitalization: No - Comment as needed  Activities of Daily Living   ADL Screening (condition at time of admission) Independently performs ADLs?: No Does the patient have a NEW difficulty with bathing/dressing/toileting/self-feeding that is expected to last >3 days?: No Does the patient have a NEW difficulty with getting in/out of bed, walking, or climbing stairs that is expected to last >3 days?: No Does the patient have a NEW difficulty with communication that is expected to last >3 days?: No Is the patient deaf or have difficulty hearing?: Yes Does the patient have difficulty seeing, even when wearing glasses/contacts?: Yes Does the patient have difficulty concentrating, remembering, or making decisions?: Yes  Permission Sought/Granted                  Emotional Assessment Appearance:: Appears stated age       Alcohol / Substance Use: Not Applicable Psych Involvement: No (comment)  Admission diagnosis:  Proctitis [K62.89] UTI (urinary tract infection) [N39.0] ESBL (extended spectrum beta-lactamase) producing bacteria infection [A49.9, Z16.12] Chronic UTI [N39.0] Patient Active Problem List   Diagnosis Date Noted   UTI (urinary tract infection) 06/14/2024   Prolonged QT interval 06/14/2024   Dehydration 06/14/2024   Anxiety 06/14/2024   Dementia without behavioral disturbance (HCC) 06/14/2024   Atelectasis 06/14/2024   Pressure injury of heel 06/06/2024   Acute renal failure  superimposed on stage 4 chronic kidney disease (HCC) - baseline Scr 2.5 06/05/2024   DNR (do not resuscitate) 06/05/2024   GI bleeding 06/05/2024   Atrial fibrillation, chronic (HCC) 05/02/2024   Supratherapeutic INR 05/02/2024   UTI due to extended-spectrum beta lactamase (ESBL) producing Escherichia coli 05/01/2024   Muscle wasting and atrophy, not elsewhere classified, right upper arm 04/07/2024   Cognitive communication deficit  04/04/2024   Muscle weakness (generalized) 04/04/2024   Unsteady gait when walking 04/04/2024   Chronic diastolic heart failure (HCC) 04/03/2024   Depression 04/03/2024   History of recurrent UTIs 04/03/2024   Insomnia, unspecified 04/03/2024   Iron deficiency anemia due to chronic blood loss 04/03/2024   Macular degeneration 04/03/2024   Loss of appetite 03/28/2024   Acute kidney injury superimposed on stage 5 chronic kidney disease, not on chronic dialysis (HCC) 03/27/2024   Chronic constipation 03/19/2024   Dermatomycosis 06/03/2023   Vitamin D  deficiency 09/07/2022   Dermatitis 04/24/2022   CKD (chronic kidney disease) stage 4, GFR 15-29 ml/min (HCC) - baseline serum creatinine 2-2.4 09/08/2021   Skin lesion of face 02/24/2021   Recurrent UTI 08/14/2019   Generalized weakness 07/04/2019   Atrial fibrillation with RVR (HCC) 07/03/2019   Vertigo 05/18/2019   Nocturnal hypoxia 12/03/2016   Osteopenia 10/18/2016   At high risk for falls 10/21/2015   Seasonal allergies 05/05/2014   GERD (gastroesophageal reflux disease) 05/05/2014   Sick sinus syndrome (HCC)    Chronic anticoagulation 11/18/2010   Impaired fasting glucose 10/14/2010   PPM-St.Jude 03/30/2010   GAD (generalized anxiety disorder) 12/05/2009   Impaired memory 03/03/2009   Acquired hypothyroidism 02/25/2008   Mixed hyperlipidemia 02/25/2008   Hearing loss 02/25/2008   Essential hypertension 02/25/2008   PCP:  [name], MD Pharmacy:   [facility] - [place], [place] - [address] [place] [address] Phone: [phone] Fax: [phone]     Social Drivers of Health (SDOH) Social History: SDOH Screenings   Food Insecurity: No Food Insecurity (06/14/2024)  Housing: Low Risk  (06/14/2024)  Transportation Needs: No Transportation Needs (06/14/2024)  Utilities: Not At Risk (06/14/2024)  Alcohol Screen: Low Risk  (09/04/2023)  Depression (PHQ2-9): Medium Risk  (04/25/2024)  Financial Resource Strain: Low Risk  (09/04/2023)  Physical Activity: Inactive (09/04/2023)  Social Connections: Socially Isolated (06/14/2024)  Stress: No Stress Concern Present (09/04/2023)  Tobacco Use: Low Risk  (06/13/2024)  Health Literacy: High Risk (04/10/2024)   Received from [facility]   SDOH Interventions:     Readmission Risk Interventions    06/14/2024   10:04 AM 06/05/2024    9:26 AM 05/02/2024    1:10 PM  Readmission Risk Prevention Plan  Transportation Screening Complete Complete Complete  PCP or Specialist Appt within 3-5 Days   Complete  HRI or Home Care Consult  Complete Complete  Social Work Consult for Recovery Care Planning/Counseling  Complete Complete  Palliative Care Screening  Not Applicable Not Applicable  Medication Review Oceanographer) Complete Complete Complete  HRI or Home Care Consult Complete    SW Recovery Care/Counseling Consult Complete    Palliative Care Screening Not Applicable    Skilled Nursing Facility Not Applicable

## 2024-06-14 NOTE — Progress Notes (Addendum)
 PROGRESS NOTE  [name] FMW:[id number] DOB: 1925/11/28 DOA: 06/13/2024 PCP: [name], MD  Brief History:   89 y.o. female with medical history significant for hypertension, atrial fibrillation on warfarin, symptomatic bradycardia with pacemaker, CKD stage IV, memory loss, anxiety, hypothyroidism, recurrent UTIs, and recent multidrug-resistant organisms who presents with decreased urine output and generalized weakness.  Daughter at bedside supplements history and endorses constipation requiring manual disimpaction.  Daughter at bedside endorsed that patient uses supplemental oxygen  via [place] at 2 LPM only at night, but she has been more dependent on the oxygen  throughout the day and night in the last few days and the O2 sats usually drops into the high 80s when she removes the supplemental oxygen . Patient denies fever, chills, nausea, vomiting, chest pain, shortness of breath.   ED Course:  In the emergency department, she was hemodynamically stable.  Workup in the ED showed normal CBC except for WBC of 14.2 and platelets of 448.  BMP was normal except for bicarb 21, blood glucose 104, BUN/creatinine 33/1.93 (creatinine was 2.46 on 06/08/2024).  Urinalysis was suggestive of UTI. CT abdomen and pelvis without contrast showed interval development of Rectal wall thickening with perirectal fat stranding, suggestive of developing proctitis.  Interval development of trace bilateral pleural effusions with associated bilateral lower lobe passive atelectasis. She was treated with IV Merrem, IV ABX pulmonary was given.  Lopressor  was given due to A-fib with RVR.    Notably, the patient had a recent hospital admission from 06/05/2024 to 06/08/2024 secondary to symptomatic anemia and supratherapeutic INR.  Warfarin was discontinued during that hospitalization, and the patient was discharged home with aspirin.  She was treated for E. coli UTI with meropenem.  She received 1 unit PRBC during  the hospital admission.   Assessment/Plan:  UTI - Continue ceftriaxone  pending culture data - UA > 50 WBC - 06/13/2024 CT AP--rectal wall thickening with perirectal fat stranding; persistent dilated CBD with intrahepatic bili ductal dilatation; no CT evidence of choledocholithiasis - Trace bilateral effusion - 06/05/2024 urine culture with Klebsiella and E. Coli - 05/01/2024 urine culture with VRE, Klebsiella, pansensitive E. coli  Acute on Chronic respiratory failure with hypoxia - She is chronically on 2 L at nighttime - VBG - CT chest  Paroxysmal atrial fibrillation - Patient has mild RVR - Continue diltiazem  and metoprolol  - Continue aspirin - Warfarin was discontinued at her last hospitalization  Failure to Thrive -pt has had functional and cognitive decline since July 2025 - B12 - Folate -TSH - UA>50 WBC -PT eval  Proctitis -continue ceftriaxone  -add metronidazole   CKD stage IV - Baseline creatinine 1.9-2.2  Symptomatic bradycardia - Status post PPM - Stable  Essential hypertension - Continue Cardizem  and metoprolol   Acquired hypothyroidism - Continue Synthroid   Stage II buttock ulcer - Present on admission - Continue local wound care - Prolonged boots for heel ulcer  Cognitive impairment - Continue Aricept  10 mg at bedtime  Goals of Care -daughter confirms DNR Advance care planning, including the explanation and discussion of advance directives was carried out with the patient and family.  Code status including explanations of Full Code and DNR and alternatives were discussed in detail.  Discussion of end-of-life issues including but not limited palliative care, hospice care and the concept of hospice, other end-of-life care options, power of attorney for health care decisions, living wills, and physician orders for life-sustaining treatment were also discussed with the patient and family.  Total face to face time 16 minutes.        Family  Communication:   daughter at bedside 10/18  Consultants:  none  Code Status:   DNR  DVT Prophylaxis:Zebulon Lovenox   Procedures: As Listed in Progress Note Above  Antibiotics: Ceftriaxone  10/17>> Metronidazole  10/18>>      Subjective: Pt denies cp, sob, abd pain.  ROS limited due to dementia  Objective: Vitals:   06/14/24 0130 06/14/24 0200 06/14/24 0327 06/14/24 0350  BP: 113/68  (!) 134/108 125/72  Pulse:  (!) 117 87   Resp:  16 18   Temp:   97.8 F (36.6 C)   TempSrc:   Oral   SpO2:  94% 97%   Weight:   69.6 kg   Height:        Intake/Output Summary (Last 24 hours) at 06/14/2024 1004 Last data filed at 06/14/2024 0625 Gross per 24 hour  Intake 130 ml  Output --  Net 130 ml   Weight change:  Exam:  General:  Pt is alert, intermittently follows commands, not in acute distress HEENT: No icterus, No thrush, No neck mass, [place]/AT Cardiovascular: RRR, S1/S2, no rubs, no gallops Respiratory: bibasilar crackles.  No wheeze Abdomen: Soft/+BS, non tender, non distended, no guarding Extremities: No edema, No lymphangitis, No petechiae, No rashes, no synovitis   Data Reviewed: I have personally reviewed following labs and imaging studies Basic Metabolic Panel: Recent Labs  Lab 06/08/24 0344 06/13/24 2016 06/14/24 0616  NA 137 138 139  K 4.1 4.1 3.9  CL 106 105 108  CO2 21* 21* 20*  GLUCOSE 87 104* 83  BUN 38* 33* 30*  CREATININE 2.46* 1.93* 1.78*  CALCIUM 8.6* 8.7* 8.6*  MG  --   --  1.8  PHOS  --   --  2.9   Liver Function Tests: Recent Labs  Lab 06/14/24 0616  AST 13*  ALT <5  ALKPHOS 116  BILITOT 0.5  PROT 5.4*  ALBUMIN  2.3*   No results for input(s): LIPASE, AMYLASE in the last 168 hours. No results for input(s): AMMONIA in the last 168 hours. Coagulation Profile: Recent Labs  Lab 06/08/24 0344  INR 1.0   CBC: Recent Labs  Lab 06/08/24 0344 06/13/24 2016 06/14/24 0616  WBC 8.3 14.2* 12.4*  NEUTROABS  --  11.3*  --   HGB  10.8* 12.2 11.2*  HCT 33.8* 37.9 35.1*  MCV 95.5 94.8 94.9  PLT 379 448* 387   Cardiac Enzymes: No results for input(s): CKTOTAL, CKMB, CKMBINDEX, TROPONINI in the last 168 hours. BNP: Invalid input(s): POCBNP CBG: No results for input(s): GLUCAP in the last 168 hours. HbA1C: No results for input(s): HGBA1C in the last 72 hours. Urine analysis:    Component Value Date/Time   COLORURINE YELLOW 06/13/2024 2311   APPEARANCEUR TURBID (A) 06/13/2024 2311   APPEARANCEUR Cloudy (A) 05/21/2024 1500   LABSPEC 1.015 06/13/2024 2311   PHURINE 5.0 06/13/2024 2311   GLUCOSEU NEGATIVE 06/13/2024 2311   HGBUR LARGE (A) 06/13/2024 2311   HGBUR trace-lysed 01/11/2010 1458   BILIRUBINUR NEGATIVE 06/13/2024 2311   BILIRUBINUR Negative 05/21/2024 1500   KETONESUR NEGATIVE 06/13/2024 2311   PROTEINUR 30 (A) 06/13/2024 2311   UROBILINOGEN 0.2 05/25/2022 1147   UROBILINOGEN 0.2 02/09/2015 1715   NITRITE NEGATIVE 06/13/2024 2311   LEUKOCYTESUR SMALL (A) 06/13/2024 2311   Sepsis Labs: @LABRCNTIP (procalcitonin:4,lacticidven:4) ) Recent Results (from the past 240 hours)  Urine Culture     Status: Abnormal  Collection Time: 06/05/24  1:48 AM   Specimen: Urine, Random  Result Value Ref Range Status   Specimen Description   Final    URINE, RANDOM Performed at [facility], [address], [place], [place] [address]    Special Requests   Final    NONE Reflexed from [id number] Performed at [facility], [address], [place], [place] [address]    Culture (A)  Final    >=100,000 COLONIES/mL ESCHERICHIA COLI >=100,000 COLONIES/mL KLEBSIELLA PNEUMONIAE UNABLE TO COMPLETE SENSITIVITIES FOR ECOLI MUST PLACE ORDER FOR SEND OUT FOR SUSCEPTIBILITY TESTING Performed at [facility], [address], [place], [place] [address]    Report Status 06/09/2024 FINAL  Final   Organism ID, Bacteria KLEBSIELLA PNEUMONIAE (A)  Final      Susceptibility   Klebsiella pneumoniae - MIC*     AMPICILLIN  >=32 RESISTANT Resistant     CEFAZOLIN  (URINE) Value in next row Sensitive      2 SENSITIVEThis is a modified FDA-approved test that has been validated and its performance characteristics determined by the reporting laboratory.  This laboratory is certified under the Clinical Laboratory Improvement Amendments CLIA as qualified to perform high complexity clinical laboratory testing.    CEFEPIME Value in next row Sensitive      2 SENSITIVEThis is a modified FDA-approved test that has been validated and its performance characteristics determined by the reporting laboratory.  This laboratory is certified under the Clinical Laboratory Improvement Amendments CLIA as qualified to perform high complexity clinical laboratory testing.    ERTAPENEM Value in next row Sensitive      2 SENSITIVEThis is a modified FDA-approved test that has been validated and its performance characteristics determined by the reporting laboratory.  This laboratory is certified under the Clinical Laboratory Improvement Amendments CLIA as qualified to perform high complexity clinical laboratory testing.    CEFTRIAXONE  Value in next row Sensitive      2 SENSITIVEThis is a modified FDA-approved test that has been validated and its performance characteristics determined by the reporting laboratory.  This laboratory is certified under the Clinical Laboratory Improvement Amendments CLIA as qualified to perform high complexity clinical laboratory testing.    CIPROFLOXACIN  Value in next row Sensitive      2 SENSITIVEThis is a modified FDA-approved test that has been validated and its performance characteristics determined by the reporting laboratory.  This laboratory is certified under the Clinical Laboratory Improvement Amendments CLIA as qualified to perform high complexity clinical laboratory testing.    GENTAMICIN  Value in next row Sensitive      2 SENSITIVEThis is a modified FDA-approved test that has been validated and its  performance characteristics determined by the reporting laboratory.  This laboratory is certified under the Clinical Laboratory Improvement Amendments CLIA as qualified to perform high complexity clinical laboratory testing.    NITROFURANTOIN  Value in next row Intermediate      2 SENSITIVEThis is a modified FDA-approved test that has been validated and its performance characteristics determined by the reporting laboratory.  This laboratory is certified under the Clinical Laboratory Improvement Amendments CLIA as qualified to perform high complexity clinical laboratory testing.    TRIMETH /SULFA  Value in next row Sensitive      2 SENSITIVEThis is a modified FDA-approved test that has been validated and its performance characteristics determined by the reporting laboratory.  This laboratory is certified under the Clinical Laboratory Improvement Amendments CLIA as qualified to perform high complexity clinical laboratory testing.    AMPICILLIN /SULBACTAM Value in  next row Sensitive      2 SENSITIVEThis is a modified FDA-approved test that has been validated and its performance characteristics determined by the reporting laboratory.  This laboratory is certified under the Clinical Laboratory Improvement Amendments CLIA as qualified to perform high complexity clinical laboratory testing.    PIP/TAZO Value in next row Sensitive      <=4 SENSITIVEThis is a modified FDA-approved test that has been validated and its performance characteristics determined by the reporting laboratory.  This laboratory is certified under the Clinical Laboratory Improvement Amendments CLIA as qualified to perform high complexity clinical laboratory testing.    MEROPENEM Value in next row Sensitive      <=4 SENSITIVEThis is a modified FDA-approved test that has been validated and its performance characteristics determined by the reporting laboratory.  This laboratory is certified under the Clinical Laboratory Improvement Amendments CLIA as  qualified to perform high complexity clinical laboratory testing.    * >=100,000 COLONIES/mL KLEBSIELLA PNEUMONIAE     Scheduled Meds:  busPIRone   7.5 mg Oral BID   diltiazem   120 mg Oral Daily   donepezil   10 mg Oral QHS   enoxaparin (LOVENOX) injection  30 mg Subcutaneous Daily   [START ON 06/15/2024] Influenza vac split trivalent PF  0.5 mL Intramuscular Tomorrow-1000   leptospermum manuka honey  1 Application Topical Daily   levothyroxine   112 mcg Oral QAC breakfast   metoprolol  tartrate  25 mg Oral BID   pantoprazole   80 mg Oral Daily   Continuous Infusions:  cefTRIAXone  (ROCEPHIN )  IV 1 g (06/14/24 0843)   lactated ringers  70 mL/hr at 06/14/24 9374    Procedures/Studies: CT ABDOMEN PELVIS WO CONTRAST Result Date: 06/14/2024 EXAM: CT ABDOMEN AND PELVIS WITHOUT CONTRAST 06/13/2024 11:54:07 PM TECHNIQUE: CT of the abdomen and pelvis was performed without the administration of intravenous contrast. Multiplanar reformatted images are provided for review. Automated exposure control, iterative reconstruction, and/or weight-based adjustment of the mA/kV was utilized to reduce the radiation dose to as low as reasonably achievable. COMPARISON: MRI MRCP 03/29/2023, CT renal 03/26/2024. CLINICAL HISTORY: Abdominal pain, acute, nonlocalized; lower abd possible stool in urine ?fistula. Urinary retention, constipation, rectal pain, lower abd pain, possible stool in urine ?Fistula. ; family states pt has urinated twice in 22hrs (pt has Stg 4 kidney disease). Family also reported to EMS that pt had been constipated and that they manually disimpacted pt at home 4 times. Pt now c/o rectal pain per EMS FINDINGS: LOWER CHEST: Interval development of trace of small volume right and trace left pleural effusions. Associated bilateral lower lobe passive atelectasis. Partially visualized cardiac pacemaker leads. Aortic valve leaflet calcification. Coronary artery calcifications. LIVER: Persistent Dilated common  bile duct measuring up to 24 cm (from 19 cm) with associated persistent intrahepatic biliary ductal dilatation. GALLBLADDER AND BILE DUCTS: Hydropic gallbladder with associated calcified gallstone within the gallbladder lumen. No gallbladder wall thickening or pericholecystic fluid. No CT evidence of choledocholithiasis. SPLEEN: No acute abnormality. PANCREAS: No acute abnormality. ADRENAL GLANDS: No acute abnormality. KIDNEYS, URETERS AND BLADDER: Fluid dense lesions of the kidneys likely represent simple renal cysts. Per consensus, no follow-up is needed for simple Bosniak type 1 and 2 renal cysts, unless the patient has a malignancy history or risk factors. No stones in the kidneys or ureters. No hydronephrosis. No perinephric or periureteral stranding. Urinary bladder is decompressed and grossly unremarkable. GI AND BOWEL: Colonic diverticulosis. Interval development of rectal wall thickening with associated perirectal fat stranding. At least small volume hiatal  hernia. Stomach demonstrates no acute abnormality. No small bowel thickening or dilatation. No large bowel dilatation. PERITONEUM AND RETROPERITONEUM: Presacral fat stranding. No ascites. No free air. VASCULATURE: Aorta is normal in caliber. Severe atherosclerotic plaque. LYMPH NODES: No lymphadenopathy. REPRODUCTIVE ORGANS: No acute abnormality. BONES AND SOFT TISSUES: Diffusely decreased bone density. Grade 1 anterolisthesis of L4 on L5. No focal soft tissue abnormality. IMPRESSION: 1. Interval development of Rectal wall thickening with perirectal fat stranding, suggestive of developing proctitis. 2. Persistent markedly dilated common bile duct with intrahepatic biliary ductal dilatation and hydropic gallbladder containing a calcified gallstone; no CT evidence of choledocholithiasis. Finding: Better evaluated on MRCP 03/28/2024 . 3. Interval development of trace bilateral pleural effusions with associated bilateral lower lobe passive atelectasis.  Electronically signed by: Morgane Naveau MD 06/14/2024 12:15 AM EDT RP Workstation: [id number]    Alm Schneider, DO  [facility]  If 7PM-7AM, please contact night-coverage [email or web address] Password [email or web address] 06/14/2024, 10:04 AM   LOS: 0 days

## 2024-06-14 NOTE — ED Provider Notes (Signed)
 Care assumed at shift change. Here for recurrent UTI, urinary retention, fecal impaction cleared at home. Pending UA and CT to eval fistula.  Physical Exam  BP (!) 114/92   Pulse (!) 135   Temp 97.8 F (36.6 C) (Oral)   Resp (!) 22   Ht 5' 6 (1.676 m)   Wt 70 kg   SpO2 97%   BMI 24.91 kg/m   Physical Exam  Procedures  Procedures  ED Course / MDM   Clinical Course as of 06/14/24 0139  Fri Jun 13, 2024  2044 Patient's family members are here now.  They said they are not particular worried about her constipation, they have been disimpacting her and that is why she is having rectal pain.  They are mostly concerned about her lack of urine output.  Her bladder scan showed 400 cc with nurses come to do an In-N-Out. [MB]  2124 Patient had a large bowel movement here.  Working on cleaning her up so they can try to get a urine sample from her. [MB]  2138 EKG not crossing into epic.  A-fib with mild RVR rate of 104. [MB]  2322 Nurse was able to cath urine and it sounds like it was fairly dark and had a lot of debris in it.  Will put her in for CT.  A-fib still is been up and down ordered another dose of metoprolol . [MB]  2354 UA continues to show signs of infection. [CS]  Sat Jun 14, 2024  0108 I personally viewed the images from radiology studies and agree with radiologist interpretation: CT shows some proctitis likely related to recent fecal impaction, daughter states she disimpacted her prior to arrival. No bleeding. Still has signs of UTI, has had ESBL in recent past. Will begin Abx. Per notes in EMR, patient is in process of transitioning to Hospice, but daughter still interested in aggressive care. Hospitalist paged for admission [CS]  0138 Spoke with Dr. Manfred, Hospitalist, who will evaluate for admission.  [CS]    Clinical Course User Index [CS] Roselyn Carlin NOVAK, MD [MB] Towana Ozell BROCKS, MD   Medical Decision Making Problems Addressed: Chronic UTI: chronic illness or injury  with exacerbation, progression, or side effects of treatment ESBL (extended spectrum beta-lactamase) producing bacteria infection: chronic illness or injury Proctitis: acute illness or injury  Amount and/or Complexity of Data Reviewed Labs:  Decision-making details documented in ED Course. Radiology: independent interpretation performed. Decision-making details documented in ED Course.  Risk Prescription drug management. Decision regarding hospitalization.          Roselyn Carlin NOVAK, MD 06/14/24 0111

## 2024-06-14 NOTE — Hospital Course (Signed)
 89 y.o. female with medical history significant for hypertension, atrial fibrillation on warfarin, symptomatic bradycardia with pacemaker, CKD stage IV, memory loss, anxiety, hypothyroidism, recurrent UTIs, and recent multidrug-resistant organisms who presents with decreased urine output and generalized weakness.  Daughter at bedside supplements history and endorses constipation requiring manual disimpaction.  Daughter at bedside endorsed that patient uses supplemental oxygen  via [place] at 2 LPM only at night, but she has been more dependent on the oxygen  throughout the day and night in the last few days and the O2 sats usually drops into the high 80s when she removes the supplemental oxygen . Patient denies fever, chills, nausea, vomiting, chest pain, shortness of breath.   ED Course:  In the emergency department, she was hemodynamically stable.  Workup in the ED showed normal CBC except for WBC of 14.2 and platelets of 448.  BMP was normal except for bicarb 21, blood glucose 104, BUN/creatinine 33/1.93 (creatinine was 2.46 on 06/08/2024).  Urinalysis was suggestive of UTI. CT abdomen and pelvis without contrast showed interval development of Rectal wall thickening with perirectal fat stranding, suggestive of developing proctitis.  Interval development of trace bilateral pleural effusions with associated bilateral lower lobe passive atelectasis. She was treated with IV Merrem, IV ABX pulmonary was given.  Lopressor  was given due to A-fib with RVR.    Notably, the patient had a recent hospital admission from 06/05/2024 to 06/08/2024 secondary to symptomatic anemia and supratherapeutic INR.  Warfarin was discontinued during that hospitalization, and the patient was discharged home with aspirin.  She was treated for E. coli UTI with meropenem.  She received 1 unit PRBC during the hospital admission.

## 2024-06-14 NOTE — Plan of Care (Signed)

## 2024-06-14 NOTE — H&P (Addendum)
 History and Physical    Patient: [name] DOB: 12-11-1925 DOA: 06/13/2024 DOS: the patient was seen and examined on 06/14/2024 PCP: [name], MD  Patient coming from: Home  Chief Complaint:  Chief Complaint  Patient presents with   Urinary Retention   Constipation   Rectal Pain   HPI: [name] is a 89 y.o. female with medical history significant of atrial fibrillation on warfarin, hypertension, symptomatic bradycardia with pacemaker, hypothyroidism, CKD stage IV, anxiety, memory loss, constipation, recurrent UTIs who presents to the emergency department from home via EMS due to decreased urine output within the last 24 to 48 hours.  Daughter at bedside endorsed history of constipation and she usually manually disimpacts her.  Patient denies any burning sensation on urination or any other irritative bladder symptoms other than reported decreased urinary output.   Daughter at bedside endorsed that patient uses supplemental oxygen  via [employer] at 2 LPM only at night, but she has been more dependent on the oxygen  throughout the day and night in the last few days and the O2 sats usually drops into the high 80s when she removes the supplemental oxygen . Patient denies fever, chills, nausea, vomiting, chest pain, shortness of breath.  ED Course:  In the emergency department, she was hemodynamically stable.  Workup in the ED showed normal CBC except for WBC of 14.2 and platelets of 448.  BMP was normal except for bicarb 21, blood glucose 104, BUN/creatinine 33/1.93 (creatinine was 2.46 on 06/08/2024).  Urinalysis was suggestive of UTI. CT abdomen and pelvis without contrast showed interval development of Rectal wall thickening with perirectal fat stranding, suggestive of developing proctitis.  Interval development of trace bilateral pleural effusions with associated bilateral lower lobe passive atelectasis. She was treated with IV Merrem, IV ABX pulmonary was given.   Lopressor  was given due to A-fib with RVR.  Review of Systems: Review of systems as noted in the HPI. All other systems reviewed and are negative.   Past Medical History:  Diagnosis Date   Allergy    Annual physical exam 04/22/2015   Anxiety    Arthritis    Arthritis    Asthma    Atrial fibrillation (HCC)    Atrial fibrillation with RVR (HCC) 07/03/2019   Bleeding disorder    CHF (congestive heart failure) (HCC)    CKD (chronic kidney disease) stage 4, GFR 15-29 ml/min (HCC) 09/08/2021   COPD (chronic obstructive pulmonary disease) (HCC)    Depression    Gastroesophageal reflux disease    Hearing impairment    Heart disease    Heart murmur    High cholesterol    Hyperlipidemia    Hypertension    Hypothyroidism    Impaired glucose tolerance    Macular degeneration    Oxygen  deficiency    Pancreatitis    Paroxysmal atrial fibrillation (HCC) 2011   Sick sinus syndrome (HCC) 2011   Atrial fibrilation 10/2009; and dual chamber pacemaker in 4/11; normal EF   Sleep apnea    Nocturnal oxygen  therapy   Zoster 2016   Past Surgical History:  Procedure Laterality Date   ABDOMINAL HYSTERECTOMY     CATARACT EXTRACTION     Bilateral   COLONOSCOPY  08/29/2007   Dr. Shaaron   EYE SURGERY     laser    PACEMAKER INSERTION  08/28/2009   dual chamber    PPM GENERATOR CHANGEOUT N/A 11/13/2016   Procedure: PPM Generator Changeout;  Surgeon: Waddell Danelle ORN, MD;  Location: [facility];  Service: Cardiovascular;  Laterality: N/A;   SALPINGOOPHORECTOMY  08/28/1968   right    Social History:  reports that she has never smoked. She has never been exposed to tobacco smoke. She has never used smokeless tobacco. She reports that she does not drink alcohol and does not use drugs.   No Known Allergies  Family History  Problem Relation Age of Onset   Cancer Mother        gential   Cancer Father        throat    Pneumonia Sister    Emphysema Sister    Mitral valve prolapse  Sister    Heart disease Other    Arthritis Other    Lung disease Other    Asthma Other    Melanoma Son    Anxiety disorder Daughter    Arthritis Daughter    Asthma Daughter    COPD Daughter    Depression Daughter    Kidney disease Daughter    Obesity Daughter    Anxiety disorder Daughter    Anxiety disorder Daughter    Arthritis Daughter      Prior to Admission medications   Medication Sig Start Date End Date Taking? Authorizing Provider  acetaminophen  (TYLENOL ) 325 MG tablet Take 2 tablets (650 mg total) by mouth every 6 (six) hours as needed for mild pain (pain score 1-3) or fever (or Fever >/= 101). 05/05/24   Emokpae, Courage, MD  aluminum-magnesium hydroxide 200-200 MG/5ML suspension Take 15 mLs by mouth every 6 (six) hours as needed for indigestion.    [provider]  aspirin EC 81 MG tablet Take 1 tablet (81 mg total) by mouth daily. Swallow whole. 06/09/24   Johnson, Clanford L, MD  busPIRone  (BUSPAR ) 7.5 MG tablet Take 1 tablet (7.5 mg total) by mouth 2 (two) times daily. 05/05/24   Pearlean Manus, MD  diltiazem  (CARDIZEM  CD) 120 MG 24 hr capsule Take 1 capsule (120 mg total) by mouth daily. HOLD FOR SBP<120 06/08/24   Johnson, Clanford L, MD  donepezil  (ARICEPT ) 10 MG tablet Take 1 tablet (10 mg total) by mouth at bedtime. 05/05/24   Pearlean Manus, MD  ferrous sulfate  325 (65 FE) MG tablet Take 1 tablet (325 mg total) by mouth daily with breakfast. 06/08/24   Vicci Afton CROME, MD  levothyroxine  (SYNTHROID ) 112 MCG tablet Take 1 tablet (112 mcg total) by mouth daily before breakfast. 05/05/24   Pearlean Manus, MD  metoprolol  tartrate (LOPRESSOR ) 25 MG tablet Take 1 tablet (25 mg total) by mouth 2 (two) times daily. Hold if SBP < 100 mmhg 05/05/24   Emokpae, Courage, MD  omeprazole  (PRILOSEC) 40 MG capsule Take 1 capsule (40 mg total) by mouth daily. 05/05/24   Pearlean Manus, MD  ursodiol  (ACTIGALL ) 300 MG capsule Take 1 capsule (300 mg total) by mouth 3 (three) times  daily. 04/03/24 07/02/24  ShahmehdiAdriana LABOR, MD  Vitamin D , Ergocalciferol , (DRISDOL ) 1.25 MG (50000 UNIT) CAPS capsule Take 50,000 Units by mouth once a week. Every Friday 05/20/24   [provider]  benzonatate  (TESSALON  PERLES) 100 MG capsule Take 1 capsule (100 mg total) by mouth every 6 (six) hours as needed for cough. 02/05/13 11/04/18  [name], MD    Physical Exam: BP 125/72 (BP Location: Right Arm)   Pulse 87   Temp 97.8 F (36.6 C) (Oral)   Resp 18   Ht 5' 6 (1.676 m)   Wt 69.6 kg  SpO2 97%   BMI 24.77 kg/m   General: 89 y.o. year-old female well developed well nourished in no acute distress.  Alert and oriented x3. HEENT: NCAT, EOMI, dry mucous membranes Neck: Supple, trachea medial Cardiovascular: Regular rate and rhythm with no rubs or gallops.  No thyromegaly or JVD noted.  No lower extremity edema. 2/4 pulses in all 4 extremities. Respiratory: Clear to auscultation with no wheezes or rales. Good inspiratory effort. Abdomen: Soft, mild tenderness to suprapubic area without guarding on palpation  Muskuloskeletal: No cyanosis, clubbing or edema noted bilaterally Neuro: CN II-XII intact, strength 5/5 x 4, sensation, reflexes intact Skin: Decreased skin turgor.  No ulcerative lesions noted or rashes Psychiatry: Judgement and insight appear normal. Mood is appropriate for condition and setting          Labs on Admission:  Basic Metabolic Panel: Recent Labs  Lab 06/08/24 0344 06/13/24 2016 06/14/24 0616  NA 137 138 139  K 4.1 4.1 3.9  CL 106 105 108  CO2 21* 21* 20*  GLUCOSE 87 104* 83  BUN 38* 33* 30*  CREATININE 2.46* 1.93* 1.78*  CALCIUM 8.6* 8.7* 8.6*  MG  --   --  1.8  PHOS  --   --  2.9   Liver Function Tests: Recent Labs  Lab 06/14/24 0616  AST 13*  ALT <5  ALKPHOS 116  BILITOT 0.5  PROT 5.4*  ALBUMIN  2.3*   No results for input(s): LIPASE, AMYLASE in the last 168 hours. No results for input(s): AMMONIA in the last 168  hours. CBC: Recent Labs  Lab 06/08/24 0344 06/13/24 2016 06/14/24 0616  WBC 8.3 14.2* 12.4*  NEUTROABS  --  11.3*  --   HGB 10.8* 12.2 11.2*  HCT 33.8* 37.9 35.1*  MCV 95.5 94.8 94.9  PLT 379 448* 387   Cardiac Enzymes: No results for input(s): CKTOTAL, CKMB, CKMBINDEX, TROPONINI in the last 168 hours.  BNP (last 3 results) No results for input(s): BNP in the last 8760 hours.  ProBNP (last 3 results) No results for input(s): PROBNP in the last 8760 hours.  CBG: No results for input(s): GLUCAP in the last 168 hours.  Radiological Exams on Admission: CT ABDOMEN PELVIS WO CONTRAST Result Date: 06/14/2024 EXAM: CT ABDOMEN AND PELVIS WITHOUT CONTRAST 06/13/2024 11:54:07 PM TECHNIQUE: CT of the abdomen and pelvis was performed without the administration of intravenous contrast. Multiplanar reformatted images are provided for review. Automated exposure control, iterative reconstruction, and/or weight-based adjustment of the mA/kV was utilized to reduce the radiation dose to as low as reasonably achievable. COMPARISON: MRI MRCP 03/29/2023, CT renal 03/26/2024. CLINICAL HISTORY: Abdominal pain, acute, nonlocalized; lower abd possible stool in urine ?fistula. Urinary retention, constipation, rectal pain, lower abd pain, possible stool in urine ?Fistula. ; family states pt has urinated twice in 22hrs (pt has Stg 4 kidney disease). Family also reported to EMS that pt had been constipated and that they manually disimpacted pt at home 4 times. Pt now c/o rectal pain per EMS FINDINGS: LOWER CHEST: Interval development of trace of small volume right and trace left pleural effusions. Associated bilateral lower lobe passive atelectasis. Partially visualized cardiac pacemaker leads. Aortic valve leaflet calcification. Coronary artery calcifications. LIVER: Persistent Dilated common bile duct measuring up to 24 cm (from 19 cm) with associated persistent intrahepatic biliary ductal dilatation.  GALLBLADDER AND BILE DUCTS: Hydropic gallbladder with associated calcified gallstone within the gallbladder lumen. No gallbladder wall thickening or pericholecystic fluid. No CT evidence of choledocholithiasis. SPLEEN: No  acute abnormality. PANCREAS: No acute abnormality. ADRENAL GLANDS: No acute abnormality. KIDNEYS, URETERS AND BLADDER: Fluid dense lesions of the kidneys likely represent simple renal cysts. Per consensus, no follow-up is needed for simple Bosniak type 1 and 2 renal cysts, unless the patient has a malignancy history or risk factors. No stones in the kidneys or ureters. No hydronephrosis. No perinephric or periureteral stranding. Urinary bladder is decompressed and grossly unremarkable. GI AND BOWEL: Colonic diverticulosis. Interval development of rectal wall thickening with associated perirectal fat stranding. At least small volume hiatal hernia. Stomach demonstrates no acute abnormality. No small bowel thickening or dilatation. No large bowel dilatation. PERITONEUM AND RETROPERITONEUM: Presacral fat stranding. No ascites. No free air. VASCULATURE: Aorta is normal in caliber. Severe atherosclerotic plaque. LYMPH NODES: No lymphadenopathy. REPRODUCTIVE ORGANS: No acute abnormality. BONES AND SOFT TISSUES: Diffusely decreased bone density. Grade 1 anterolisthesis of L4 on L5. No focal soft tissue abnormality. IMPRESSION: 1. Interval development of Rectal wall thickening with perirectal fat stranding, suggestive of developing proctitis. 2. Persistent markedly dilated common bile duct with intrahepatic biliary ductal dilatation and hydropic gallbladder containing a calcified gallstone; no CT evidence of choledocholithiasis. Finding: Better evaluated on MRCP 03/28/2024 . 3. Interval development of trace bilateral pleural effusions with associated bilateral lower lobe passive atelectasis. Electronically signed by: Morgane Naveau MD 06/14/2024 12:15 AM EDT RP Workstation: [id number]    EKG: I  independently viewed the EKG done and my findings are as followed: A-fib with RVR With RBBB/LAFB and QTc of 512 milliseconds  Assessment/Plan Present on Admission:  UTI (urinary tract infection)  Acquired hypothyroidism  CKD (chronic kidney disease) stage 4, GFR 15-29 ml/min (HCC) - baseline serum creatinine 2-2.4  Atrial fibrillation with RVR (HCC)  Principal Problem:   UTI (urinary tract infection) Active Problems:   Acquired hypothyroidism   CKD (chronic kidney disease) stage 4, GFR 15-29 ml/min (HCC) - baseline serum creatinine 2-2.4   Atrial fibrillation with RVR (HCC)   Prolonged QT interval   Dehydration   Anxiety   Dementia without behavioral disturbance (HCC)   Atelectasis  UTI POA Patient was treated with IV Merrem Blood culture done on 06/05/2024 was positive for Klebsiella pneumoniae and this was sensitive to ceftriaxone  Blood culture done on 9//25 was positive for Klebsiella pneumoniae, E. coli, and Enterococcus Faecium.  Klebsiella pneumoniae and E. coli was sensitive to ceftriaxone  We will continue with IV ceftriaxone  at this time Urine culture pending  Prolonged QT interval QTc 512 ms Avoid QT prolonging drugs Magnesium level will be checked Repeat EKG in the morning  Atelectasis Patient's daughter reported patient being more dependent on oxygen  within the last few days.  CT abdomen and pelvis showed trace bilateral pleural effusions with bilateral lower lobe atelectasis Continue incentive spirometry and flutter valve Lasix  held at this time due to dehydrated state of patient requiring gentle hydration at this time  CKD stage 4 Dehydration BUN/creatinine 33/1.93 (creatinine was 2.46 on 06/08/2024) Continue gentle hydration Renally adjust medications, avoid nephrotoxic agents/dehydration/hypotension  Paroxysmal atrial fibrillation with RVR Continue diltiazem , metoprolol   Patient is no longer on anticoagulation (warfarin) due to increased risk of  bleeding  Anxiety; dementia  Continue donepezil , buspirone   Continue delirium precautions     Acquired hypothyroidism  Continue Synthroid    DVT prophylaxis: Lovenox  Code Status: DNR  Family Communication: Daughter at bedside (all questions answered to satisfaction)  Consults: None  Severity of Illness: The appropriate patient status for this patient is INPATIENT. Inpatient status is judged to be reasonable  and necessary in order to provide the required intensity of service to ensure the patient's safety. The patient's presenting symptoms, physical exam findings, and initial radiographic and laboratory data in the context of their chronic comorbidities is felt to place them at high risk for further clinical deterioration. Furthermore, it is not anticipated that the patient will be medically stable for discharge from the hospital within 2 midnights of admission.   * I certify that at the point of admission it is my clinical judgment that the patient will require inpatient hospital care spanning beyond 2 midnights from the point of admission due to high intensity of service, high risk for further deterioration and high frequency of surveillance required.*  Author: Emeka Lindner, DO 06/14/2024 7:40 AM  For on call review [email or web address].

## 2024-06-14 NOTE — Consult Note (Signed)
    WOC Nurse Consult Note: Reason for Consult:pressure wound to buttock, left/right heel  Wound type:  Stage 2 left buttock; 100% pink, clean Deep Tissue Pressure Injury: right achilles; 100% dark purple, skin intact Unstageable Pressure Injury: left heel; 50% black/50% yellow Unstable Pressure Injury: right heel; 100% black Pressure Injury POA: Yes Measurement: Wound bed: Drainage (amount, consistency, odor)  Periwound: Dressing procedure/placement/frequency: Prevalon boots bilaterally to offload heel pressure injuries Xeroform and foam to the right achilles and buttock; unless frequent incontinence, if so use barrier cream only. Change bilat heel dressings Q day as follows; lightly scrub with moist gauze to assist with removal of loose tissue. Apply Medihoney to bilat heels Q day, then cover with foam dressing.  Change foam dressing q 3 days or PRN soiling.  Consult RD for wound supplementation. Turn and reposition per hospital policy.  Low air loss mattress for moisture management and pressure redistribution in high risk patient.    Re consult if needed, will not follow at this time. Thanks  Meredeth Furber [employer], RN,CWOCN, CNS, [employer] [phone]

## 2024-06-14 NOTE — Plan of Care (Signed)
   Problem: Coping: Goal: Level of anxiety will decrease Outcome: Progressing   Problem: Pain Managment: Goal: General experience of comfort will improve and/or be controlled Outcome: Progressing

## 2024-06-14 NOTE — Progress Notes (Signed)
 Patient's daughter from out of town, Particia Breeding called to ask about patient, this nurse explained that information could not be shared if she was not on patient contact list. Per patient and her daughter (emergency contact) Mitzie Fees permission was given to add Particia Breeding on patient contact list. This conversation was witnessed by charge nurse Corean.

## 2024-06-14 NOTE — Telephone Encounter (Signed)
 Daughter called at 12:20am requesting Dr. Ranae awareness and input on management of her mother's Afib  Patient is currently in the [facility] ED being treated for urinary retention, she is noted to be mildly tachycardic in Afib, and the daughter is concerned that medication changes being made are without Dr. Ranae awareness.  Advised daughter that HR will react a bit to physiologic stress, so while she's symptomatic, the heart rate might rise somewhat.  Asked her to communicate her concerns to the ED staff.  I will route this message to Dr. Ranae office for his awareness.  He is not available tonight to respond to her questions.  [facility] on call.

## 2024-06-15 DIAGNOSIS — N184 Chronic kidney disease, stage 4 (severe): Secondary | ICD-10-CM | POA: Diagnosis not present

## 2024-06-15 DIAGNOSIS — J181 Lobar pneumonia, unspecified organism: Secondary | ICD-10-CM

## 2024-06-15 DIAGNOSIS — I4891 Unspecified atrial fibrillation: Secondary | ICD-10-CM

## 2024-06-15 LAB — BASIC METABOLIC PANEL WITH GFR
Anion gap: 11 (ref 5–15)
BUN: 26 mg/dL — ABNORMAL HIGH (ref 8–23)
CO2: 21 mmol/L — ABNORMAL LOW (ref 22–32)
Calcium: 8.3 mg/dL — ABNORMAL LOW (ref 8.9–10.3)
Chloride: 110 mmol/L (ref 98–111)
Creatinine, Ser: 1.6 mg/dL — ABNORMAL HIGH (ref 0.44–1.00)
GFR, Estimated: 29 mL/min — ABNORMAL LOW (ref >60)
Glucose, Bld: 76 mg/dL (ref 70–99)
Potassium: 3.4 mmol/L — ABNORMAL LOW (ref 3.5–5.1)
Sodium: 141 mmol/L (ref 135–145)

## 2024-06-15 LAB — MAGNESIUM: Magnesium: 2 mg/dL (ref 1.7–2.4)

## 2024-06-15 LAB — CBC
HCT: 33.3 % — ABNORMAL LOW (ref 36.0–46.0)
Hemoglobin: 10.5 g/dL — ABNORMAL LOW (ref 12.0–15.0)
MCH: 30.7 pg (ref 26.0–34.0)
MCHC: 31.5 g/dL (ref 30.0–36.0)
MCV: 97.4 fL (ref 80.0–100.0)
Platelets: 362 K/uL (ref 150–400)
RBC: 3.42 MIL/uL — ABNORMAL LOW (ref 3.87–5.11)
RDW: 15 % (ref 11.5–15.5)
WBC: 9.9 K/uL (ref 4.0–10.5)
nRBC: 0 % (ref 0.0–0.2)

## 2024-06-15 LAB — URINE CULTURE: Culture: NO GROWTH

## 2024-06-15 LAB — C DIFFICILE QUICK SCREEN W PCR REFLEX
C Diff antigen: NEGATIVE
C Diff interpretation: NOT DETECTED
C Diff toxin: NEGATIVE

## 2024-06-15 MED ORDER — POTASSIUM CHLORIDE 20 MEQ PO PACK
20.0000 meq | PACK | Freq: Once | ORAL | Status: AC
  Administered 2024-06-15: 20 meq via ORAL
  Filled 2024-06-15: qty 1

## 2024-06-15 MED ORDER — ADULT MULTIVITAMIN W/MINERALS CH
1.0000 | ORAL_TABLET | Freq: Every day | ORAL | Status: DC
  Administered 2024-06-15 – 2024-06-16 (×2): 1 via ORAL
  Filled 2024-06-15 (×2): qty 1

## 2024-06-15 MED ORDER — ENSURE PLUS HIGH PROTEIN PO LIQD
237.0000 mL | Freq: Two times a day (BID) | ORAL | Status: DC
  Administered 2024-06-15: 237 mL via ORAL

## 2024-06-15 MED ORDER — ALUM & MAG HYDROXIDE-SIMETH 200-200-20 MG/5ML PO SUSP
30.0000 mL | Freq: Four times a day (QID) | ORAL | Status: DC | PRN
  Administered 2024-06-15: 30 mL via ORAL
  Filled 2024-06-15: qty 30

## 2024-06-15 MED ORDER — FOLIC ACID 1 MG PO TABS
1.0000 mg | ORAL_TABLET | Freq: Every day | ORAL | Status: DC
  Administered 2024-06-15 – 2024-06-16 (×2): 1 mg via ORAL
  Filled 2024-06-15 (×2): qty 1

## 2024-06-15 MED ORDER — JUVEN PO PACK
1.0000 | PACK | Freq: Two times a day (BID) | ORAL | Status: DC
  Administered 2024-06-15: 1 via ORAL
  Filled 2024-06-15 (×2): qty 1

## 2024-06-15 NOTE — Progress Notes (Signed)
 PROGRESS NOTE  [name] FMW:[id number] DOB: 1926-05-06 DOA: 06/13/2024 PCP: [name], MD  Brief History:   89 y.o. female with medical history significant for hypertension, atrial fibrillation on warfarin, symptomatic bradycardia with pacemaker, CKD stage IV, memory loss, anxiety, hypothyroidism, recurrent UTIs, and recent multidrug-resistant organisms who presents with decreased urine output and generalized weakness.  Daughter at bedside supplements history and endorses constipation requiring manual disimpaction.  Daughter at bedside endorsed that patient uses supplemental oxygen  via [employer] at 2 LPM only at night, but she has been more dependent on the oxygen  throughout the day and night in the last few days and the O2 sats usually drops into the high 80s when she removes the supplemental oxygen . Patient denies fever, chills, nausea, vomiting, chest pain, shortness of breath.   ED Course:  In the emergency department, she was hemodynamically stable.  Workup in the ED showed normal CBC except for WBC of 14.2 and platelets of 448.  BMP was normal except for bicarb 21, blood glucose 104, BUN/creatinine 33/1.93 (creatinine was 2.46 on 06/08/2024).  Urinalysis was suggestive of UTI. CT abdomen and pelvis without contrast showed interval development of Rectal wall thickening with perirectal fat stranding, suggestive of developing proctitis.  Interval development of trace bilateral pleural effusions with associated bilateral lower lobe passive atelectasis. She was treated with IV Merrem, IV ABX pulmonary was given.  Lopressor  was given due to A-fib with RVR.    Notably, the patient had a recent hospital admission from 06/05/2024 to 06/08/2024 secondary to symptomatic anemia and supratherapeutic INR.  Warfarin was discontinued during that hospitalization, and the patient was discharged home with aspirin.  She was treated for E. coli UTI with meropenem.  She received 1 unit PRBC during  the hospital admission.   Assessment/Plan:  UTI - Continue ceftriaxone  pending culture data - UA > 50 WBC - 06/13/2024 CT AP--rectal wall thickening with perirectal fat stranding; persistent dilated CBD with intrahepatic bili ductal dilatation; no CT evidence of choledocholithiasis - Trace bilateral effusion - 06/05/2024 urine culture with Klebsiella and E. Coli - 05/01/2024 urine culture with VRE, Klebsiella, pansensitive E. coli   Acute on Chronic respiratory failure with hypoxia - She is chronically on 2 L at nighttime - now on 2L, 24/7 - due to pneumonia - VBG 7.42/31/51/20 - CT chest atelectasis vs infiltrate in bilateral LL, small bilateral pleural effusions  Pneumonia -continue ceftriaxone  -add doxy - CT chest as discussed above   Paroxysmal atrial fibrillation - Patient has mild RVR - Continue diltiazem  and metoprolol  - Continue aspirin - Warfarin was discontinued at her last hospitalization   Failure to Thrive -pt has had functional and cognitive decline since July 2025 - B12--830 - Folate--4.9>>supplement -TSH-5.860 - UA>50 WBC -PT eval   Proctitis -continue ceftriaxone  -added metronidazole    CKD stage IV - Baseline creatinine 1.9-2.2   Symptomatic bradycardia - Status post PPM - Stable   Essential hypertension - Continue Cardizem  and metoprolol    Acquired hypothyroidism - Continue Synthroid    Stage II buttock ulcer - Present on admission - Continue local wound care - Prolonged boots for heel ulcer   Cognitive impairment - Continue Aricept  10 mg at bedtime   Goals of Care -daughter confirms DNR Advance care planning, including the explanation and discussion of advance directives was carried out with the patient and family.  Code status including explanations of Full Code and DNR and alternatives were discussed in detail.  Discussion  of end-of-life issues including but not limited palliative care, hospice care and the concept of hospice,  other end-of-life care options, power of attorney for health care decisions, living wills, and physician orders for life-sustaining treatment were also discussed with the patient and family.  Total face to face time 16 minutes.       Family Communication:   daughter at bedside 10/19   Consultants:  none   Code Status:   DNR   DVT Prophylaxis:[place] Lovenox     Procedures: As Listed in Progress Note Above   Antibiotics: Ceftriaxone  10/17>> Metronidazole  10/18>> Doxy 10/19>>           Subjective:  Pt is feeling better today.  Denies f/c, sob, n/v/d, abd pain Objective: Vitals:   06/14/24 2128 06/15/24 0519 06/15/24 0924 06/15/24 1354  BP: 93/64 (!) 127/57 125/74 106/72  Pulse: 73 74 99 75  Resp:    18  Temp:  98 F (36.7 C) 97.6 F (36.4 C)   TempSrc:  Oral Oral   SpO2:  100% 96% 94%  Weight:      Height:        Intake/Output Summary (Last 24 hours) at 06/15/2024 1739 Last data filed at 06/15/2024 1642 Gross per 24 hour  Intake 1563.9 ml  Output --  Net 1563.9 ml   Weight change:  Exam:  General:  Pt is alert, follows commands appropriately, not in acute distress HEENT: No icterus, No thrush, No neck mass, [place]/AT Cardiovascular: RRR, S1/S2, no rubs, no gallops Respiratory: bibasilar rales. No wheeze Abdomen: Soft/+BS, non tender, non distended, no guarding Extremities: No edema, No lymphangitis, No petechiae, No rashes, no synovitis   Data Reviewed: I have personally reviewed following labs and imaging studies Basic Metabolic Panel: Recent Labs  Lab 06/13/24 2016 06/14/24 0616 06/15/24 0323  NA 138 139 141  K 4.1 3.9 3.4*  CL 105 108 110  CO2 21* 20* 21*  GLUCOSE 104* 83 76  BUN 33* 30* 26*  CREATININE 1.93* 1.78* 1.60*  CALCIUM 8.7* 8.6* 8.3*  MG  --  1.8 2.0  PHOS  --  2.9  --    Liver Function Tests: Recent Labs  Lab 06/14/24 0616  AST 13*  ALT <5  ALKPHOS 116  BILITOT 0.5  PROT 5.4*  ALBUMIN  2.3*   No results for input(s):  LIPASE, AMYLASE in the last 168 hours. No results for input(s): AMMONIA in the last 168 hours. Coagulation Profile: No results for input(s): INR, PROTIME in the last 168 hours. CBC: Recent Labs  Lab 06/13/24 2016 06/14/24 0616 06/15/24 0323  WBC 14.2* 12.4* 9.9  NEUTROABS 11.3*  --   --   HGB 12.2 11.2* 10.5*  HCT 37.9 35.1* 33.3*  MCV 94.8 94.9 97.4  PLT 448* 387 362   Cardiac Enzymes: No results for input(s): CKTOTAL, CKMB, CKMBINDEX, TROPONINI in the last 168 hours. BNP: Invalid input(s): POCBNP CBG: No results for input(s): GLUCAP in the last 168 hours. HbA1C: No results for input(s): HGBA1C in the last 72 hours. Urine analysis:    Component Value Date/Time   COLORURINE YELLOW 06/13/2024 2311   APPEARANCEUR TURBID (A) 06/13/2024 2311   APPEARANCEUR Cloudy (A) 05/21/2024 1500   LABSPEC 1.015 06/13/2024 2311   PHURINE 5.0 06/13/2024 2311   GLUCOSEU NEGATIVE 06/13/2024 2311   HGBUR LARGE (A) 06/13/2024 2311   HGBUR trace-lysed 01/11/2010 1458   BILIRUBINUR NEGATIVE 06/13/2024 2311   BILIRUBINUR Negative 05/21/2024 1500   KETONESUR NEGATIVE 06/13/2024 2311  PROTEINUR 30 (A) 06/13/2024 2311   UROBILINOGEN 0.2 05/25/2022 1147   UROBILINOGEN 0.2 02/09/2015 1715   NITRITE NEGATIVE 06/13/2024 2311   LEUKOCYTESUR SMALL (A) 06/13/2024 2311   Sepsis Labs: @LABRCNTIP (procalcitonin:4,lacticidven:4) ) Recent Results (from the past 240 hours)  Urine Culture     Status: None   Collection Time: 06/13/24 11:11 PM   Specimen: Urine, Random  Result Value Ref Range Status   Specimen Description   Final    URINE, RANDOM Performed at [facility], [address] [place], [place] [address]    Special Requests   Final    NONE Reflexed from [id number] Performed at [facility], [address], [place], [place] [address]    Culture   Final    NO GROWTH Performed at [facility], [address], [place], [place] [address]    Report Status 06/15/2024  FINAL  Final     Scheduled Meds:  busPIRone   7.5 mg Oral BID   diltiazem   120 mg Oral Daily   donepezil   10 mg Oral QHS   enoxaparin (LOVENOX) injection  30 mg Subcutaneous Daily   feeding supplement  237 mL Oral BID BM   folic acid   1 mg Oral Daily   Influenza vac split trivalent PF  0.5 mL Intramuscular Tomorrow-1000   leptospermum manuka honey  1 Application Topical Daily   levothyroxine   112 mcg Oral QAC breakfast   metoprolol  tartrate  25 mg Oral BID   multivitamin with minerals  1 tablet Oral Daily   nutrition supplement (JUVEN)  1 packet Oral BID BM   pantoprazole   80 mg Oral Daily   Continuous Infusions:  cefTRIAXone  (ROCEPHIN )  IV 1 g (06/15/24 1053)   metronidazole  500 mg (06/15/24 1153)    Procedures/Studies: CT CHEST WO CONTRAST Result Date: 06/14/2024 CLINICAL DATA:  Respiratory illness, nondiagnostic x-ray. Hypoxia and dyspnea. EXAM: CT CHEST WITHOUT CONTRAST TECHNIQUE: Multidetector CT imaging of the chest was performed following the standard protocol without IV contrast. RADIATION DOSE REDUCTION: This exam was performed according to the departmental dose-optimization program which includes automated exposure control, adjustment of the mA and/or kV according to patient size and/or use of iterative reconstruction technique. COMPARISON:  03/28/2024, 06/13/2024. FINDINGS: Cardiovascular: The heart is normal in size and no significant pericardial effusion is seen. A multi lead pacemaker device terminates in the heart. There are multi-vessel coronary artery calcifications. Atherosclerotic calcification of the aorta is noted without evidence of aneurysm. The pulmonary trunk is mildly distended which may be associated with underlying pulmonary artery hypertension. Mediastinum/Nodes: No mediastinal or axillary lymphadenopathy by size criteria. Evaluation of the hila is limited due to lack of IV contrast. The trachea and esophagus are within normal limits. There is a small hiatal  hernia. Lungs/Pleura: There are small bilateral pleural effusions, greater on the right than on the left. Apical pleural and parenchymal scarring is present bilaterally. Atelectasis or infiltrate is noted in the lower lobes bilaterally. No pneumothorax is seen. There is a 5 mm nodule in the right middle lobe, axial image 72. Upper Abdomen: Intrahepatic and extrahepatic biliary ductal dilatation are present with the common bile duct measuring up to 2.1 cm. Stones are noted within the gallbladder. The liver has a slightly nodular contour suggesting underlying cirrhosis. Hypodensities are noted in the kidneys bilaterally, compatible with cysts. Musculoskeletal: A cardiac device is noted in the anterior chest wall on the left. Degenerative changes are present in the thoracic spine. No acute osseous abnormality is seen. IMPRESSION: 1.  Small bilateral pleural effusions with associated atelectasis or infiltrate, greater on the right than on the left. 2. 5 mm right middle lobe pulmonary nodule. No follow-up needed if patient is low-risk.This recommendation follows the consensus statement: Guidelines for Management of Incidental Pulmonary Nodules Detected on CT Images: From the Fleischner Society 2017; Radiology 2017; 284:228-243. 3. Cholelithiasis. 4. Intrahepatic and extrahepatic biliary ductal dilatation, better evaluated by MRCP on 03/28/2024. 5. Nodular contour of the liver, possible underlying cirrhosis. 6. Small hiatal hernia. 7. Coronary artery calcifications and aortic atherosclerosis. Electronically Signed   By: Leita Birmingham M.D.   On: 06/14/2024 17:54   CT ABDOMEN PELVIS WO CONTRAST Result Date: 06/14/2024 EXAM: CT ABDOMEN AND PELVIS WITHOUT CONTRAST 06/13/2024 11:54:07 PM TECHNIQUE: CT of the abdomen and pelvis was performed without the administration of intravenous contrast. Multiplanar reformatted images are provided for review. Automated exposure control, iterative reconstruction, and/or weight-based  adjustment of the mA/kV was utilized to reduce the radiation dose to as low as reasonably achievable. COMPARISON: MRI MRCP 03/29/2023, CT renal 03/26/2024. CLINICAL HISTORY: Abdominal pain, acute, nonlocalized; lower abd possible stool in urine ?fistula. Urinary retention, constipation, rectal pain, lower abd pain, possible stool in urine ?Fistula. ; family states pt has urinated twice in 22hrs (pt has Stg 4 kidney disease). Family also reported to EMS that pt had been constipated and that they manually disimpacted pt at home 4 times. Pt now c/o rectal pain per EMS FINDINGS: LOWER CHEST: Interval development of trace of small volume right and trace left pleural effusions. Associated bilateral lower lobe passive atelectasis. Partially visualized cardiac pacemaker leads. Aortic valve leaflet calcification. Coronary artery calcifications. LIVER: Persistent Dilated common bile duct measuring up to 24 cm (from 19 cm) with associated persistent intrahepatic biliary ductal dilatation. GALLBLADDER AND BILE DUCTS: Hydropic gallbladder with associated calcified gallstone within the gallbladder lumen. No gallbladder wall thickening or pericholecystic fluid. No CT evidence of choledocholithiasis. SPLEEN: No acute abnormality. PANCREAS: No acute abnormality. ADRENAL GLANDS: No acute abnormality. KIDNEYS, URETERS AND BLADDER: Fluid dense lesions of the kidneys likely represent simple renal cysts. Per consensus, no follow-up is needed for simple Bosniak type 1 and 2 renal cysts, unless the patient has a malignancy history or risk factors. No stones in the kidneys or ureters. No hydronephrosis. No perinephric or periureteral stranding. Urinary bladder is decompressed and grossly unremarkable. GI AND BOWEL: Colonic diverticulosis. Interval development of rectal wall thickening with associated perirectal fat stranding. At least small volume hiatal hernia. Stomach demonstrates no acute abnormality. No small bowel thickening or  dilatation. No large bowel dilatation. PERITONEUM AND RETROPERITONEUM: Presacral fat stranding. No ascites. No free air. VASCULATURE: Aorta is normal in caliber. Severe atherosclerotic plaque. LYMPH NODES: No lymphadenopathy. REPRODUCTIVE ORGANS: No acute abnormality. BONES AND SOFT TISSUES: Diffusely decreased bone density. Grade 1 anterolisthesis of L4 on L5. No focal soft tissue abnormality. IMPRESSION: 1. Interval development of Rectal wall thickening with perirectal fat stranding, suggestive of developing proctitis. 2. Persistent markedly dilated common bile duct with intrahepatic biliary ductal dilatation and hydropic gallbladder containing a calcified gallstone; no CT evidence of choledocholithiasis. Finding: Better evaluated on MRCP 03/28/2024 . 3. Interval development of trace bilateral pleural effusions with associated bilateral lower lobe passive atelectasis. Electronically signed by: Morgane Naveau MD 06/14/2024 12:15 AM EDT RP Workstation: [id number]    Alm Schneider, DO  [facility]  If 7PM-7AM, please contact night-coverage [email or web address] Password [email or web address] 06/15/2024, 5:39 PM   LOS: 1 day

## 2024-06-15 NOTE — Progress Notes (Signed)
 PT Cancellation Note  Patient Details Name: [name] MRN: [id number] DOB: 03-07-1926   Cancelled Treatment:     PT refused treatment.  PT granddaughter in room states [facility] PT quit getting pt up secondary to heel ulcers.  PT has not walked in months.   Montie Metro, PT CLT [phone]  06/15/2024, 10:30 AM

## 2024-06-15 NOTE — Plan of Care (Signed)
   Problem: Coping: Goal: Level of anxiety will decrease Outcome: Progressing   Problem: Pain Managment: Goal: General experience of comfort will improve and/or be controlled Outcome: Progressing

## 2024-06-15 NOTE — Progress Notes (Signed)
 Patient repositioned to left side with pillows for comfort, remains clean and dry at this time, no further loose stool as of yet. Stool studies ordered. Patient and daughter at bedside aware to call for assistance if any toileting or incontinence needs. Enteric isolation remains in place, patient and daughter educated on enteric precautions and importance of handwashing. Daughter verbalized understanding.

## 2024-06-15 NOTE — Progress Notes (Addendum)
 Nurse at bedside,patient's IV leaking to left antecubital.IV discontinued,catheter intact.New IV started 22 gauge to right lateral forearm times one attempt,patient tolerated procedure. Plan of care on going.

## 2024-06-15 NOTE — Progress Notes (Signed)
 Initial Nutrition Assessment  DOCUMENTATION CODES:   Not applicable  INTERVENTION:  Liberalize diet to DYS3 to promote PO intake Ordering assistance Encourage PO intake Magic cup TID with meals, each supplement provides 290 kcal and 9 grams of protein Ensure Plus High Protein po BID, each supplement provides 350 kcal and 20 grams of protein MVI with minerals daily and 1mg  of folic acid  for deficiency in labs 1 packet Juven BID, each packet provides 95 calories, 2.5 grams of protein (collagen), and micronutrients to support wound healing   NUTRITION DIAGNOSIS:   Increased nutrient needs related to wound healing as evidenced by estimated needs.  GOAL:   Patient will meet greater than or equal to 90% of their needs  MONITOR:   PO intake, Supplement acceptance, I & O's, Labs, Skin  REASON FOR ASSESSMENT:   Consult Wound healing  ASSESSMENT:  Pt with hx of HTN, HLD, GERD, CHF, COPD, CKD4, atrial fibrillation, memory impairment, and blindness presented to ED with low urine output and rectal pain after disimpaction at home.  RD working remotely  Unable to reach pt at this time.   Reviewed intake and poor PO so far. Likely related to overly restrictive diet in the context of her advanced age and limited dentition. Adjusted to soft foods to promote PO. Will also add supplements and vitamins to support wound healing.   Will follow-up in-person for physical exam and nutrition hx.   Admit weight: 70kg Current weight: 69.6 kg   6.3% weight loss noted x 6 months, not severe for timeframe but concerning duet o advanced age  Average Meal Intake: 10/19: 25% intake x 1 recorded meals  Nutritionally Relevant Medications: Scheduled Meds:  levothyroxine   112 mcg Oral QAC breakfast   pantoprazole   80 mg Oral Daily   Continuous Infusions:  cefTRIAXone  (ROCEPHIN )  IV 1 g (06/15/24 1053)   metronidazole  500 mg (06/15/24 1153)    Labs Reviewed: Potassium 3.4 BUN 26, creatinine  1.6  Micronutrient Profile: Folate 4.9 (low) Vitamin B12 (830 (WNL)  NUTRITION - FOCUSED PHYSICAL EXAM: Defer to in-person assessment  Diet Order:   Diet Order             Diet Heart Room service appropriate? Yes; Fluid consistency: Thin  Diet effective now                   EDUCATION NEEDS:   Not appropriate for education at this time  Skin:  Skin Assessment: Skin Integrity Issues: Per WOC assessment 10/18: Stage 2: - left buttock; (2 x 1 cm) DTI: - right achilles; 100% dark purple, skin intact Unstageable Pressure Injury:  - left heel; 50% black/50% yellow - right heel; 100% black  Last BM:  10/19 - type 6  Height:   Ht Readings from Last 1 Encounters:  06/13/24 5' 6 (1.676 m)    Weight:   Wt Readings from Last 1 Encounters:  06/14/24 69.6 kg    Ideal Body Weight:  59.1 kg  BMI:  Body mass index is 24.77 kg/m.  Estimated Nutritional Needs:   Kcal:  1500-1700 kcal/d  Protein:  70-90g/d  Fluid:  1.5-1.7L/d    Vernell Lukes, RD, LDN, CNSC Registered Dietitian II Please reach out via secure chat

## 2024-06-15 NOTE — Progress Notes (Addendum)
 Nurse at bedside,patient alert to person,and place,confused to situation,and time.Patient is legally blind. Patient was able to take medications whole one at a time this morning. No c/o pain or discomfort noted. Family at bedside.Was informed by daughter that patient had black stool last night,at this time they wanted to hold off on the Lovenox injection,Dr Tat notified.Plan of care on going.

## 2024-06-16 DIAGNOSIS — F0390 Unspecified dementia without behavioral disturbance: Secondary | ICD-10-CM | POA: Diagnosis not present

## 2024-06-16 DIAGNOSIS — N184 Chronic kidney disease, stage 4 (severe): Secondary | ICD-10-CM | POA: Diagnosis not present

## 2024-06-16 DIAGNOSIS — N3001 Acute cystitis with hematuria: Secondary | ICD-10-CM | POA: Diagnosis not present

## 2024-06-16 DIAGNOSIS — J181 Lobar pneumonia, unspecified organism: Secondary | ICD-10-CM | POA: Diagnosis not present

## 2024-06-16 LAB — BASIC METABOLIC PANEL WITH GFR
Anion gap: 9 (ref 5–15)
BUN: 28 mg/dL — ABNORMAL HIGH (ref 8–23)
CO2: 20 mmol/L — ABNORMAL LOW (ref 22–32)
Calcium: 8.6 mg/dL — ABNORMAL LOW (ref 8.9–10.3)
Chloride: 109 mmol/L (ref 98–111)
Creatinine, Ser: 1.41 mg/dL — ABNORMAL HIGH (ref 0.44–1.00)
GFR, Estimated: 34 mL/min — ABNORMAL LOW (ref >60)
Glucose, Bld: 93 mg/dL (ref 70–99)
Potassium: 3.6 mmol/L (ref 3.5–5.1)
Sodium: 139 mmol/L (ref 135–145)

## 2024-06-16 LAB — MAGNESIUM: Magnesium: 1.9 mg/dL (ref 1.7–2.4)

## 2024-06-16 MED ORDER — AMOXICILLIN-POT CLAVULANATE 500-125 MG PO TABS: 1.0000 | ORAL_TABLET | Freq: Two times a day (BID) | ORAL | Status: DC

## 2024-06-16 MED ORDER — FOLIC ACID 1 MG PO TABS: 1.0000 mg | ORAL_TABLET | Freq: Every day | ORAL | Status: DC

## 2024-06-16 MED ORDER — AMOXICILLIN-POT CLAVULANATE 500-125 MG PO TABS: 1.0000 | ORAL_TABLET | Freq: Two times a day (BID) | ORAL | 0 refills | Status: DC

## 2024-06-16 MED ORDER — AMOXICILLIN-POT CLAVULANATE 875-125 MG PO TABS: 1.0000 | ORAL_TABLET | Freq: Two times a day (BID) | ORAL | Status: DC

## 2024-06-16 NOTE — Plan of Care (Signed)
  Problem: Elimination: Goal: Will not experience complications related to urinary retention Outcome: Progressing   Problem: Pain Managment: Goal: General experience of comfort will improve and/or be controlled Outcome: Progressing   Problem: Skin Integrity: Goal: Risk for impaired skin integrity will decrease Outcome: Not Progressing

## 2024-06-16 NOTE — Progress Notes (Signed)
 Patient c/o her IV burning. Fluids stopped and IV removed. Patient refusing to get another IV at this time. Adefeso, DO made aware. Per Adefeso okay to wait and get an IV in the morning before her next scheduled antibiotics at 10 AM.

## 2024-06-16 NOTE — TOC Transition Note (Signed)
 Transition of Care [facility]) - Discharge Note   Patient Details  Name: [name] MRN: [id number] Date of Birth: 08-Apr-1926  [facility] of [facility]) CM/SW Contact:  Mcarthur Saddie Kim, LCSW Phone Number: 06/16/2024, 3:08 PM   Clinical Narrative:  Pt d/c today. Home health ordered. LCSW spoke with pt's daughter, Mitzie who reports PCP made hospice referral to [facility] and family request hospice not home health. LCSW spoke with Lucie to confirm referral was received by PCP. Hospice was scheduled to assess pt when she came to hospital. Lucie aware of d/c today. Hospice will assess at home. No other needs reported.      Final next level of care: Home w Hospice Care Barriers to Discharge: Barriers Resolved   Patient Goals and CMS Choice Patient states their goals for this hospitalization and ongoing recovery are:: return home CMS Medicare.gov Compare Post Acute Care list provided to:: Patient Represenative (must comment) Choice offered to / list presented to : Adult Children      Discharge Placement                    Patient and family notified of of transfer: 06/16/24  Discharge Plan and Services Additional resources added to the After Visit Summary for   In-house Referral: Clinical Social Work Discharge Planning Services: CM Consult Post Acute Care Choice: Home Health                               Social Drivers of Health (SDOH) Interventions SDOH Screenings   Food Insecurity: No Food Insecurity (06/14/2024)  Housing: Low Risk  (06/14/2024)  Transportation Needs: No Transportation Needs (06/14/2024)  Utilities: Not At Risk (06/14/2024)  Alcohol Screen: Low Risk  (09/04/2023)  Depression (PHQ2-9): Medium Risk (04/25/2024)  Financial Resource Strain: Low Risk  (09/04/2023)  Physical Activity: Inactive (09/04/2023)  Social Connections: Socially Isolated (06/14/2024)  Stress: No Stress Concern Present (09/04/2023)  Tobacco Use: Low Risk  (06/13/2024)   Health Literacy: High Risk (04/10/2024)   Received from [facility]     Readmission Risk Interventions    06/14/2024   10:04 AM 06/05/2024    9:26 AM 05/02/2024    1:10 PM  Readmission Risk Prevention Plan  Transportation Screening Complete Complete Complete  PCP or Specialist Appt within 3-5 Days   Complete  HRI or Home Care Consult  Complete Complete  Social Work Consult for Recovery Care Planning/Counseling  Complete Complete  Palliative Care Screening  Not Applicable Not Applicable  Medication Review [employer]) Complete Complete Complete  HRI or Home Care Consult Complete    SW Recovery Care/Counseling Consult Complete    Palliative Care Screening Not Applicable    Skilled Nursing Facility Not Applicable

## 2024-06-16 NOTE — Progress Notes (Signed)
 Wound care to bilateral heels not done at this time, daughter at bedside requested it be done at a later time.

## 2024-06-16 NOTE — Progress Notes (Signed)
 Discharge instructions reviewed with daughter Mitzie, Telemetry removed. Changed patient and readied for discharge. To discharge via [facility].

## 2024-06-16 NOTE — Discharge Summary (Signed)
 Physician Discharge Summary   Patient: [name] MRN: [id number] DOB: 06-16-1926  Admit date:     06/13/2024  Discharge date: 06/16/24  Discharge Physician: Alm Christiano Blandon   PCP: [name], MD   Recommendations at discharge:   Please follow up with primary care provider within 1-2 weeks  Please repeat BMP and CBC in one week      Hospital Course:  89 y.o. female with medical history significant for hypertension, atrial fibrillation on warfarin, symptomatic bradycardia with pacemaker, CKD stage IV, memory loss, anxiety, hypothyroidism, recurrent UTIs, and recent multidrug-resistant organisms who presents with decreased urine output and generalized weakness.  Daughter at bedside supplements history and endorses constipation requiring manual disimpaction.  Daughter at bedside endorsed that patient uses supplemental oxygen  via [place] at 2 LPM only at night, but she has been more dependent on the oxygen  throughout the day and night in the last few days and the O2 sats usually drops into the high 80s when she removes the supplemental oxygen . Patient denies fever, chills, nausea, vomiting, chest pain, shortness of breath.   ED Course:  In the emergency department, she was hemodynamically stable.  Workup in the ED showed normal CBC except for WBC of 14.2 and platelets of 448.  BMP was normal except for bicarb 21, blood glucose 104, BUN/creatinine 33/1.93 (creatinine was 2.46 on 06/08/2024).  Urinalysis was suggestive of UTI. CT abdomen and pelvis without contrast showed interval development of Rectal wall thickening with perirectal fat stranding, suggestive of developing proctitis.  Interval development of trace bilateral pleural effusions with associated bilateral lower lobe passive atelectasis. She was treated with IV Merrem, IV ABX pulmonary was given.  Lopressor  was given due to A-fib with RVR.    Notably, the patient had a recent hospital admission from 06/05/2024 to 06/08/2024 secondary  to symptomatic anemia and supratherapeutic INR.  Warfarin was discontinued during that hospitalization, and the patient was discharged home with aspirin.  She was treated for E. coli UTI with meropenem.  She received 1 unit PRBC during the hospital admission.  Assessment and Plan: UTI - Continue ceftriaxone  pending culture data - UA > 50 WBC - 06/13/2024 CT [facility]--rectal wall thickening with perirectal fat stranding; persistent dilated CBD with intrahepatic bili ductal dilatation; no CT evidence of choledocholithiasis - Trace bilateral effusion - 06/05/2024 urine culture with Klebsiella and E. Coli - 05/01/2024 urine culture with VRE, Klebsiella, pansensitive E. coli   Acute on Chronic respiratory failure with hypoxia - She is chronically on 2 L at nighttime - now on 2L, 24/7--stable - due to pneumonia - VBG 7.42/31/51/20 - CT chest atelectasis vs infiltrate in bilateral LL, small bilateral pleural effusions   Pneumonia -continue ceftriaxone  - CT chest as discussed above - d/c home with amox/clav x 5 more days   Paroxysmal atrial fibrillation - Patient has mild RVR - Continue diltiazem  and metoprolol  - Continue aspirin - Warfarin was discontinued at her last hospitalization   Failure to Thrive -pt has had functional and cognitive decline since July 2025 - B12--830 - Folate--4.9>>supplement -TSH-5.860 - UA>50 WBC   Proctitis -continue ceftriaxone  -added metronidazole  during hospitalization -d/c home with amox/clav x 5 more days   CKD stage IV - Baseline creatinine 1.9-2.2 - serum creatinine 1.41 on day of dc   Symptomatic bradycardia - Status post PPM - Stable   Essential hypertension - Continue Cardizem  and metoprolol    Acquired hypothyroidism - Continue Synthroid    Stage II buttock ulcer - Present on admission - Continue  local wound care - Prolonged boots for heel ulcer   Cognitive impairment - Continue Aricept  10 mg at bedtime   Goals of Care -daughter  confirms DNR Advance care planning, including the explanation and discussion of advance directives was carried out with the patient and family.  Code status including explanations of Full Code and DNR and alternatives were discussed in detail.  Discussion of end-of-life issues including but not limited palliative care, hospice care and the concept of hospice, other end-of-life care options, power of attorney for health care decisions, living wills, and physician orders for life-sustaining treatment were also discussed with the patient and family.  Total face to face time 16 minutes.      Consultants: none Procedures performed: none  Disposition: Home Diet recommendation:  Regular diet DISCHARGE MEDICATION: Allergies as of 06/16/2024   No Known Allergies      Medication List     TAKE these medications    acetaminophen  325 MG tablet Commonly known as: TYLENOL  Take 2 tablets (650 mg total) by mouth every 6 (six) hours as needed for mild pain (pain score 1-3) or fever (or Fever >/= 101). What changed: when to take this   aluminum-magnesium hydroxide 200-200 MG/5ML suspension Take 15 mLs by mouth every 6 (six) hours as needed for indigestion.   amoxicillin -clavulanate 500-125 MG tablet Commonly known as: AUGMENTIN  Take 1 tablet by mouth 2 (two) times daily.   aspirin EC 81 MG tablet Take 1 tablet (81 mg total) by mouth daily. Swallow whole.   bismuth subsalicylate 262 MG/15ML suspension Commonly known as: PEPTO BISMOL Take 30 mLs by mouth every 6 (six) hours as needed for diarrhea or loose stools or indigestion.   busPIRone  7.5 MG tablet Commonly known as: BUSPAR  Take 1 tablet (7.5 mg total) by mouth 2 (two) times daily.   diltiazem  120 MG 24 hr capsule Commonly known as: CARDIZEM  CD Take 1 capsule (120 mg total) by mouth daily. HOLD FOR SBP<120   donepezil  10 MG tablet Commonly known as: ARICEPT  Take 1 tablet (10 mg total) by mouth at bedtime.   ferrous sulfate   325 (65 FE) MG tablet Take 1 tablet (325 mg total) by mouth daily with breakfast.   folic acid  1 MG tablet Commonly known as: FOLVITE  Take 1 tablet (1 mg total) by mouth daily. Start taking on: June 17, 2024   levothyroxine  112 MCG tablet Commonly known as: SYNTHROID  Take 1 tablet (112 mcg total) by mouth daily before breakfast.   metoprolol  tartrate 25 MG tablet Commonly known as: LOPRESSOR  Take 1 tablet (25 mg total) by mouth 2 (two) times daily. Hold if SBP < 100 mmhg   omeprazole  40 MG capsule Commonly known as: PRILOSEC Take 1 capsule (40 mg total) by mouth daily.   torsemide  20 MG tablet Commonly known as: DEMADEX  Take 10 mg by mouth daily as needed (swelling, shortness of breath).   ursodiol  300 MG capsule Commonly known as: ACTIGALL  Take 1 capsule (300 mg total) by mouth 3 (three) times daily.   Vitamin D  (Ergocalciferol ) 1.25 MG (50000 UNIT) Caps capsule Commonly known as: DRISDOL  Take 50,000 Units by mouth every Friday.        Discharge Exam: Filed Weights   06/13/24 1943 06/14/24 0327  Weight: 70 kg 69.6 kg   HEENT:  [place]/AT, No thrush, no icterus CV:  RRR, no rub, no S3, no S4 Lung:  bibasilar crackles.  No wheeze Abd:  soft/+BS, NT Ext:  No edema, no lymphangitis, no synovitis, no  rash   Condition at discharge: stable  The results of significant diagnostics from this hospitalization (including imaging, microbiology, ancillary and laboratory) are listed below for reference.   Imaging Studies: CT CHEST WO CONTRAST Result Date: 06/14/2024 CLINICAL DATA:  Respiratory illness, nondiagnostic x-ray. Hypoxia and dyspnea. EXAM: CT CHEST WITHOUT CONTRAST TECHNIQUE: Multidetector CT imaging of the chest was performed following the standard protocol without IV contrast. RADIATION DOSE REDUCTION: This exam was performed according to the departmental dose-optimization program which includes automated exposure control, adjustment of the mA and/or kV according to  patient size and/or use of iterative reconstruction technique. COMPARISON:  03/28/2024, 06/13/2024. FINDINGS: Cardiovascular: The heart is normal in size and no significant pericardial effusion is seen. A multi lead pacemaker device terminates in the heart. There are multi-vessel coronary artery calcifications. Atherosclerotic calcification of the aorta is noted without evidence of aneurysm. The pulmonary trunk is mildly distended which may be associated with underlying pulmonary artery hypertension. Mediastinum/Nodes: No mediastinal or axillary lymphadenopathy by size criteria. Evaluation of the hila is limited due to lack of IV contrast. The trachea and esophagus are within normal limits. There is a small hiatal hernia. Lungs/Pleura: There are small bilateral pleural effusions, greater on the right than on the left. Apical pleural and parenchymal scarring is present bilaterally. Atelectasis or infiltrate is noted in the lower lobes bilaterally. No pneumothorax is seen. There is a 5 mm nodule in the right middle lobe, axial image 72. Upper Abdomen: Intrahepatic and extrahepatic biliary ductal dilatation are present with the common bile duct measuring up to 2.1 cm. Stones are noted within the gallbladder. The liver has a slightly nodular contour suggesting underlying cirrhosis. Hypodensities are noted in the kidneys bilaterally, compatible with cysts. Musculoskeletal: A cardiac device is noted in the anterior chest wall on the left. Degenerative changes are present in the thoracic spine. No acute osseous abnormality is seen. IMPRESSION: 1. Small bilateral pleural effusions with associated atelectasis or infiltrate, greater on the right than on the left. 2. 5 mm right middle lobe pulmonary nodule. No follow-up needed if patient is low-risk.This recommendation follows the consensus statement: Guidelines for Management of Incidental Pulmonary Nodules Detected on CT Images: From the Fleischner Society 2017; Radiology  2017; 284:228-243. 3. Cholelithiasis. 4. Intrahepatic and extrahepatic biliary ductal dilatation, better evaluated by MRCP on 03/28/2024. 5. Nodular contour of the liver, possible underlying cirrhosis. 6. Small hiatal hernia. 7. Coronary artery calcifications and aortic atherosclerosis. Electronically Signed   By: Leita Birmingham M.D.   On: 06/14/2024 17:54   CT ABDOMEN PELVIS WO CONTRAST Result Date: 06/14/2024 EXAM: CT ABDOMEN AND PELVIS WITHOUT CONTRAST 06/13/2024 11:54:07 PM TECHNIQUE: CT of the abdomen and pelvis was performed without the administration of intravenous contrast. Multiplanar reformatted images are provided for review. Automated exposure control, iterative reconstruction, and/or weight-based adjustment of the mA/kV was utilized to reduce the radiation dose to as low as reasonably achievable. COMPARISON: MRI MRCP 03/29/2023, CT renal 03/26/2024. CLINICAL HISTORY: Abdominal pain, acute, nonlocalized; lower abd possible stool in urine ?fistula. Urinary retention, constipation, rectal pain, lower abd pain, possible stool in urine ?Fistula. ; family states pt has urinated twice in 22hrs (pt has Stg 4 kidney disease). Family also reported to EMS that pt had been constipated and that they manually disimpacted pt at home 4 times. Pt now c/o rectal pain per EMS FINDINGS: LOWER CHEST: Interval development of trace of small volume right and trace left pleural effusions. Associated bilateral lower lobe passive atelectasis. Partially visualized cardiac pacemaker leads.  Aortic valve leaflet calcification. Coronary artery calcifications. LIVER: Persistent Dilated common bile duct measuring up to 24 cm (from 19 cm) with associated persistent intrahepatic biliary ductal dilatation. GALLBLADDER AND BILE DUCTS: Hydropic gallbladder with associated calcified gallstone within the gallbladder lumen. No gallbladder wall thickening or pericholecystic fluid. No CT evidence of choledocholithiasis. SPLEEN: No acute  abnormality. PANCREAS: No acute abnormality. ADRENAL GLANDS: No acute abnormality. KIDNEYS, URETERS AND BLADDER: Fluid dense lesions of the kidneys likely represent simple renal cysts. Per consensus, no follow-up is needed for simple Bosniak type 1 and 2 renal cysts, unless the patient has a malignancy history or risk factors. No stones in the kidneys or ureters. No hydronephrosis. No perinephric or periureteral stranding. Urinary bladder is decompressed and grossly unremarkable. GI AND BOWEL: Colonic diverticulosis. Interval development of rectal wall thickening with associated perirectal fat stranding. At least small volume hiatal hernia. Stomach demonstrates no acute abnormality. No small bowel thickening or dilatation. No large bowel dilatation. PERITONEUM AND RETROPERITONEUM: Presacral fat stranding. No ascites. No free air. VASCULATURE: Aorta is normal in caliber. Severe atherosclerotic plaque. LYMPH NODES: No lymphadenopathy. REPRODUCTIVE ORGANS: No acute abnormality. BONES AND SOFT TISSUES: Diffusely decreased bone density. Grade 1 anterolisthesis of L4 on L5. No focal soft tissue abnormality. IMPRESSION: 1. Interval development of Rectal wall thickening with perirectal fat stranding, suggestive of developing proctitis. 2. Persistent markedly dilated common bile duct with intrahepatic biliary ductal dilatation and hydropic gallbladder containing a calcified gallstone; no CT evidence of choledocholithiasis. Finding: Better evaluated on MRCP 03/28/2024 . 3. Interval development of trace bilateral pleural effusions with associated bilateral lower lobe passive atelectasis. Electronically signed by: Morgane Naveau MD 06/14/2024 12:15 AM EDT RP Workstation: [id number]    Microbiology: Results for orders placed or performed during the hospital encounter of 06/13/24  Urine Culture     Status: None   Collection Time: 06/13/24 11:11 PM   Specimen: Urine, Random  Result Value Ref Range Status   Specimen  Description   Final    URINE, RANDOM Performed at [facility], [address], [place], [place] [address]    Special Requests   Final    NONE Reflexed from [id number] Performed at [facility], [address], [place], [place] [address]    Culture   Final    NO GROWTH Performed at [facility], [address], [place], [place] [address]    Report Status 06/15/2024 FINAL  Final  C Difficile Quick Screen w PCR reflex     Status: None   Collection Time: 06/15/24  2:51 PM   Specimen: STOOL  Result Value Ref Range Status   C Diff antigen NEGATIVE NEGATIVE Final   C Diff toxin NEGATIVE NEGATIVE Final   C Diff interpretation No C. difficile detected.  Final    Comment: Performed at [facility], [address] [place], [place] [address]   *Note: Due to a large number of results and/or encounters for the requested time period, some results have not been displayed. A complete set of results can be found in Results Review.    Labs: CBC: Recent Labs  Lab 06/13/24 2016 06/14/24 0616 06/15/24 0323  WBC 14.2* 12.4* 9.9  NEUTROABS 11.3*  --   --   HGB 12.2 11.2* 10.5*  HCT 37.9 35.1* 33.3*  MCV 94.8 94.9 97.4  PLT 448* 387 362   Basic Metabolic Panel: Recent Labs  Lab 06/13/24 2016 06/14/24 0616 06/15/24 0323 06/16/24 0433  NA 138 139 141 139  K 4.1  3.9 3.4* 3.6  CL 105 108 110 109  CO2 21* 20* 21* 20*  GLUCOSE 104* 83 76 93  BUN 33* 30* 26* 28*  CREATININE 1.93* 1.78* 1.60* 1.41*  CALCIUM 8.7* 8.6* 8.3* 8.6*  MG  --  1.8 2.0 1.9  PHOS  --  2.9  --   --    Liver Function Tests: Recent Labs  Lab 06/14/24 0616  AST 13*  ALT <5  ALKPHOS 116  BILITOT 0.5  PROT 5.4*  ALBUMIN  2.3*   CBG: No results for input(s): GLUCAP in the last 168 hours.  Discharge time spent: greater than 30 minutes.  Signed: Alm Schneider, MD [facility] 06/16/2024

## 2024-06-17 ENCOUNTER — Telehealth

## 2024-06-17 ENCOUNTER — Telehealth: Payer: Self-pay | Admitting: *Deleted

## 2024-06-17 LAB — GASTROINTESTINAL PANEL BY PCR, STOOL (REPLACES STOOL CULTURE)

## 2024-06-17 NOTE — Transitions of Care (Post Inpatient/ED Visit) (Signed)
   06/17/2024  Name: [name] MRN: [id number] DOB: [birth year]  Today's TOC FU Call Status: Today's TOC FU Call Status:: Successful TOC FU Call Completed TOC FU Call Complete Date: 06/17/24 Patient's Name and Date of Birth confirmed.  Transition Care Management Follow-up Telephone Call Date of Discharge: 06/16/24 [name] Type of Discharge: Inpatient Admission Primary Inpatient Discharge Diagnosis:: UTI (urinary tract infection)  Attempted to reach patient/ family, no answer to telephone. TOC RN made outreach to verify receipt of hospice referral and start of care. RN confirmed that patient is actively enrolled in hospice program. Hospice Agency: [facility] RN spoke with: Bernice Pickerel of Care Date: 06/17/24 No further interventions at this time.   Mliss Creed [facility], BSN RN Care Manager/ Transition of Care / [facility] [phone]

## 2024-06-22 DIAGNOSIS — R2681 Unsteadiness on feet: Secondary | ICD-10-CM | POA: Diagnosis not present

## 2024-06-22 DIAGNOSIS — I5032 Chronic diastolic (congestive) heart failure: Secondary | ICD-10-CM | POA: Diagnosis not present

## 2024-06-22 DIAGNOSIS — R609 Edema, unspecified: Secondary | ICD-10-CM | POA: Diagnosis not present

## 2024-06-22 DIAGNOSIS — M6281 Muscle weakness (generalized): Secondary | ICD-10-CM | POA: Diagnosis not present

## 2024-06-23 ENCOUNTER — Telehealth: Payer: Self-pay | Admitting: Cardiology

## 2024-06-24 ENCOUNTER — Telehealth: Admitting: *Deleted

## 2024-06-27 ENCOUNTER — Telehealth: Payer: Self-pay | Admitting: Family Medicine

## 2024-06-27 NOTE — Telephone Encounter (Signed)
 Copied from CRM #[id number]. Topic: General - Deceased Patient >> 06-30-2024 10:10 AM Jasmin G wrote: Rep called to request for Ms. Simpson to sign pt's death certificate ASAP.

## 2024-06-28 NOTE — Telephone Encounter (Signed)
 Granddaughter called to report patient passed away this morning 07/06/2024).  Granddaughter stated can contact [name] for confirmation.

## 2024-06-28 NOTE — Telephone Encounter (Addendum)
 I will forward to Dr.Branch as an FYI  I spoke with granddaughter, [name], and offered our convalescences

## 2024-06-28 DEATH — deceased

## 2024-07-29 ENCOUNTER — Ambulatory Visit: Payer: Medicare Other

## 2024-08-14 ENCOUNTER — Encounter: Admitting: Family Medicine

## 2024-09-08 ENCOUNTER — Ambulatory Visit: Payer: Medicare Other

## 2024-10-28 ENCOUNTER — Ambulatory Visit: Payer: Medicare Other

## 2025-01-27 ENCOUNTER — Ambulatory Visit: Payer: Medicare Other

## 2025-06-01 ENCOUNTER — Ambulatory Visit: Admitting: Nurse Practitioner
